# Patient Record
Sex: Male | Born: 1955
Health system: Southern US, Community
[De-identification: ages and names within clinical notes are randomized; demographics above are authoritative.]

## PROBLEM LIST (undated history)

## (undated) DIAGNOSIS — K859 Acute pancreatitis without necrosis or infection, unspecified: Secondary | ICD-10-CM

## (undated) DIAGNOSIS — I509 Heart failure, unspecified: Secondary | ICD-10-CM

## (undated) DIAGNOSIS — K7460 Unspecified cirrhosis of liver: Secondary | ICD-10-CM

## (undated) DIAGNOSIS — I2510 Atherosclerotic heart disease of native coronary artery without angina pectoris: Secondary | ICD-10-CM

## (undated) DIAGNOSIS — I10 Essential (primary) hypertension: Secondary | ICD-10-CM

## (undated) HISTORY — PX: CARDIAC SURGERY: SHX584

---

## 2007-09-19 ENCOUNTER — Emergency Department (HOSPITAL_COMMUNITY): Admission: EM | Admit: 2007-09-19 | Discharge: 2007-09-19 | Payer: Self-pay | Admitting: Emergency Medicine

## 2013-10-02 ENCOUNTER — Encounter (HOSPITAL_BASED_OUTPATIENT_CLINIC_OR_DEPARTMENT_OTHER): Payer: Self-pay | Admitting: Emergency Medicine

## 2013-10-02 ENCOUNTER — Emergency Department (HOSPITAL_BASED_OUTPATIENT_CLINIC_OR_DEPARTMENT_OTHER)
Admission: EM | Admit: 2013-10-02 | Discharge: 2013-10-02 | Disposition: A | Discharge status: Home / Self Care | Payer: Self-pay | Attending: Emergency Medicine | Admitting: Emergency Medicine

## 2013-10-02 DIAGNOSIS — W261XXA Contact with sword or dagger, initial encounter: Secondary | ICD-10-CM

## 2013-10-02 DIAGNOSIS — F17200 Nicotine dependence, unspecified, uncomplicated: Secondary | ICD-10-CM | POA: Insufficient documentation

## 2013-10-02 DIAGNOSIS — W260XXA Contact with knife, initial encounter: Secondary | ICD-10-CM | POA: Insufficient documentation

## 2013-10-02 DIAGNOSIS — S61419A Laceration without foreign body of unspecified hand, initial encounter: Secondary | ICD-10-CM

## 2013-10-02 DIAGNOSIS — Y929 Unspecified place or not applicable: Secondary | ICD-10-CM | POA: Insufficient documentation

## 2013-10-02 DIAGNOSIS — S61209A Unspecified open wound of unspecified finger without damage to nail, initial encounter: Secondary | ICD-10-CM | POA: Insufficient documentation

## 2013-10-02 DIAGNOSIS — Z23 Encounter for immunization: Secondary | ICD-10-CM | POA: Insufficient documentation

## 2013-10-02 DIAGNOSIS — Y9389 Activity, other specified: Secondary | ICD-10-CM | POA: Insufficient documentation

## 2013-10-02 MED ORDER — TETANUS-DIPHTH-ACELL PERTUSSIS 5-2.5-18.5 LF-MCG/0.5 IM SUSP
0.5000 mL | Freq: Once | INTRAMUSCULAR | Status: AC
  Administered 2013-10-02: 0.5 mL via INTRAMUSCULAR
  Filled 2013-10-02: qty 0.5

## 2013-10-02 NOTE — ED Notes (Signed)
Pt reports laceration on (R) thumb 2-3 minutes PTA.  Bleeding controlled.

## 2013-10-02 NOTE — ED Notes (Signed)
MD at bedside. 

## 2013-10-02 NOTE — ED Provider Notes (Signed)
CSN: [id number]     Arrival date & time 10/02/13  1657 History   First MD Initiated Contact with Patient 10/02/13 1708     Chief Complaint  Patient presents with  . Extremity Laceration     (Consider location/radiation/quality/duration/timing/severity/associated sxs/prior Treatment) Patient is a 58 y.o. male presenting with skin laceration.  Laceration Location:  Finger Finger laceration location:  R thumb Quality: straight   Bleeding: controlled   Time since incident:  20 minutes Laceration mechanism:  Knife Pain details:    Severity:  Mild   Timing:  Constant Foreign body present:  No foreign bodies Relieved by:  Pressure Worsened by:  Nothing tried Tetanus status:  Out of date   History reviewed. No pertinent past medical history. History reviewed. No pertinent past surgical history. History reviewed. No pertinent family history. History  Substance Use Topics  . Smoking status: Current Every Day Smoker -- 0.50 packs/day    Types: Cigarettes  . Smokeless tobacco: Not on file  . Alcohol Use: 1.2 oz/week    2 Cans of beer per week     Comment: 2 40oz beer daily    Review of Systems  All other systems reviewed and are negative.     Allergies  Review of patient's allergies indicates no known allergies.  Home Medications   Prior to Admission medications   Not on File   BP 163/91  Pulse 72  Temp(Src) 98 F (36.7 C) (Oral)  Resp 18  Ht 5\' 5"  (1.651 m)  Wt 130 lb (58.968 kg)  BMI 21.63 kg/m2  SpO2 98% Physical Exam  Nursing note and vitals reviewed. Constitutional: He is oriented to person, place, and time. He appears well-developed and well-nourished. No distress.  HENT:  Head: Normocephalic and atraumatic.  Eyes: Conjunctivae are normal. No scleral icterus.  Neck: Neck supple.  Cardiovascular: Normal rate and intact distal pulses.   Pulmonary/Chest: Effort normal. No stridor. No respiratory distress.  Abdominal: Normal appearance. He exhibits no  distension.  Neurological: He is alert and oriented to person, place, and time.  Skin: Skin is warm and dry. No rash noted.  2cm linear laceration to base of right thumb.    Psychiatric: He has a normal mood and affect. His behavior is normal.    ED Course  LACERATION REPAIR Date/Time: 10/02/2013 6:07 PM Performed by: Blake Divine DAVID III Authorized by: Blake Divine DAVID III Consent: Verbal consent obtained. Risks and benefits: risks, benefits and alternatives were discussed Body area: upper extremity Location details: right thumb Laceration length: 2 cm Foreign bodies: no foreign bodies Tendon involvement: none Nerve involvement: none Vascular damage: no Local anesthetic: lidocaine 2% without epinephrine Anesthetic total: 2 ml Irrigation solution: saline Irrigation method: jet lavage Amount of cleaning: standard Debridement: none Degree of undermining: none Skin closure: 4-0 Prolene Number of sutures: 3 Technique: simple Approximation: close Approximation difficulty: simple Patient tolerance: Patient tolerated the procedure well with no immediate complications.   (including critical care time) Labs Review Labs Reviewed - No data to display  Imaging Review No results found.   EKG Interpretation None      MDM   Final diagnoses:  Hand laceration    Lac repaired.  Tetanus updated.    Candyce Churn III, MD 10/02/13 [phone]

## 2013-10-02 NOTE — ED Notes (Signed)
MD at bedside suturing lac 

## 2013-10-02 NOTE — Discharge Instructions (Signed)

## 2014-10-12 DIAGNOSIS — E785 Hyperlipidemia, unspecified: Secondary | ICD-10-CM | POA: Diagnosis present

## 2015-03-28 DIAGNOSIS — D509 Iron deficiency anemia, unspecified: Secondary | ICD-10-CM | POA: Diagnosis present

## 2015-12-12 DIAGNOSIS — M545 Low back pain, unspecified: Secondary | ICD-10-CM | POA: Insufficient documentation

## 2015-12-12 DIAGNOSIS — G8929 Other chronic pain: Secondary | ICD-10-CM | POA: Insufficient documentation

## 2015-12-12 DIAGNOSIS — Z951 Presence of aortocoronary bypass graft: Secondary | ICD-10-CM

## 2018-06-22 ENCOUNTER — Emergency Department (HOSPITAL_BASED_OUTPATIENT_CLINIC_OR_DEPARTMENT_OTHER): Payer: Medicaid Other

## 2018-06-22 ENCOUNTER — Other Ambulatory Visit: Payer: Self-pay

## 2018-06-22 ENCOUNTER — Encounter (HOSPITAL_BASED_OUTPATIENT_CLINIC_OR_DEPARTMENT_OTHER): Payer: Self-pay | Admitting: Emergency Medicine

## 2018-06-22 ENCOUNTER — Inpatient Hospital Stay (HOSPITAL_BASED_OUTPATIENT_CLINIC_OR_DEPARTMENT_OTHER)
Admission: EM | Admit: 2018-06-22 | Discharge: 2018-06-27 | DRG: 287 | Disposition: A | Discharge status: Home Health | Payer: Medicaid Other | Attending: Internal Medicine | Admitting: Internal Medicine

## 2018-06-22 DIAGNOSIS — R7989 Other specified abnormal findings of blood chemistry: Secondary | ICD-10-CM | POA: Diagnosis present

## 2018-06-22 DIAGNOSIS — Z23 Encounter for immunization: Secondary | ICD-10-CM

## 2018-06-22 DIAGNOSIS — Z951 Presence of aortocoronary bypass graft: Secondary | ICD-10-CM | POA: Diagnosis not present

## 2018-06-22 DIAGNOSIS — J449 Chronic obstructive pulmonary disease, unspecified: Secondary | ICD-10-CM | POA: Diagnosis present

## 2018-06-22 DIAGNOSIS — R42 Dizziness and giddiness: Secondary | ICD-10-CM | POA: Diagnosis not present

## 2018-06-22 DIAGNOSIS — R0609 Other forms of dyspnea: Secondary | ICD-10-CM

## 2018-06-22 DIAGNOSIS — I351 Nonrheumatic aortic (valve) insufficiency: Secondary | ICD-10-CM | POA: Diagnosis present

## 2018-06-22 DIAGNOSIS — Z7982 Long term (current) use of aspirin: Secondary | ICD-10-CM | POA: Diagnosis not present

## 2018-06-22 DIAGNOSIS — D509 Iron deficiency anemia, unspecified: Secondary | ICD-10-CM | POA: Diagnosis present

## 2018-06-22 DIAGNOSIS — I110 Hypertensive heart disease with heart failure: Principal | ICD-10-CM | POA: Diagnosis present

## 2018-06-22 DIAGNOSIS — I447 Left bundle-branch block, unspecified: Secondary | ICD-10-CM | POA: Diagnosis not present

## 2018-06-22 DIAGNOSIS — Z599 Problem related to housing and economic circumstances, unspecified: Secondary | ICD-10-CM

## 2018-06-22 DIAGNOSIS — I2510 Atherosclerotic heart disease of native coronary artery without angina pectoris: Secondary | ICD-10-CM | POA: Diagnosis present

## 2018-06-22 DIAGNOSIS — N179 Acute kidney failure, unspecified: Secondary | ICD-10-CM | POA: Diagnosis present

## 2018-06-22 DIAGNOSIS — R634 Abnormal weight loss: Secondary | ICD-10-CM | POA: Diagnosis present

## 2018-06-22 DIAGNOSIS — Z716 Tobacco abuse counseling: Secondary | ICD-10-CM

## 2018-06-22 DIAGNOSIS — F17210 Nicotine dependence, cigarettes, uncomplicated: Secondary | ICD-10-CM | POA: Diagnosis present

## 2018-06-22 DIAGNOSIS — I5021 Acute systolic (congestive) heart failure: Secondary | ICD-10-CM | POA: Diagnosis present

## 2018-06-22 DIAGNOSIS — I5023 Acute on chronic systolic (congestive) heart failure: Secondary | ICD-10-CM | POA: Diagnosis not present

## 2018-06-22 DIAGNOSIS — I219 Acute myocardial infarction, unspecified: Secondary | ICD-10-CM | POA: Diagnosis not present

## 2018-06-22 DIAGNOSIS — Z79899 Other long term (current) drug therapy: Secondary | ICD-10-CM | POA: Diagnosis not present

## 2018-06-22 DIAGNOSIS — Z9114 Patient's other noncompliance with medication regimen: Secondary | ICD-10-CM

## 2018-06-22 DIAGNOSIS — I083 Combined rheumatic disorders of mitral, aortic and tricuspid valves: Secondary | ICD-10-CM | POA: Diagnosis present

## 2018-06-22 DIAGNOSIS — R509 Fever, unspecified: Secondary | ICD-10-CM | POA: Diagnosis not present

## 2018-06-22 DIAGNOSIS — Z955 Presence of coronary angioplasty implant and graft: Secondary | ICD-10-CM

## 2018-06-22 DIAGNOSIS — Z681 Body mass index (BMI) 19 or less, adult: Secondary | ICD-10-CM

## 2018-06-22 DIAGNOSIS — Z862 Personal history of diseases of the blood and blood-forming organs and certain disorders involving the immune mechanism: Secondary | ICD-10-CM

## 2018-06-22 DIAGNOSIS — I509 Heart failure, unspecified: Secondary | ICD-10-CM

## 2018-06-22 DIAGNOSIS — R059 Cough, unspecified: Secondary | ICD-10-CM

## 2018-06-22 DIAGNOSIS — I25110 Atherosclerotic heart disease of native coronary artery with unstable angina pectoris: Secondary | ICD-10-CM | POA: Diagnosis not present

## 2018-06-22 DIAGNOSIS — R778 Other specified abnormalities of plasma proteins: Secondary | ICD-10-CM

## 2018-06-22 DIAGNOSIS — R0602 Shortness of breath: Secondary | ICD-10-CM

## 2018-06-22 DIAGNOSIS — R05 Cough: Secondary | ICD-10-CM

## 2018-06-22 DIAGNOSIS — I252 Old myocardial infarction: Secondary | ICD-10-CM

## 2018-06-22 HISTORY — DX: Essential (primary) hypertension: I10

## 2018-06-22 HISTORY — DX: Atherosclerotic heart disease of native coronary artery without angina pectoris: I25.10

## 2018-06-22 LAB — CBC WITH DIFFERENTIAL/PLATELET
Abs Immature Granulocytes: 0.04 10*3/uL (ref 0.00–0.07)
BASOS PCT: 0 %
Basophils Absolute: 0 10*3/uL (ref 0.0–0.1)
EOS ABS: 0.1 10*3/uL (ref 0.0–0.5)
EOS PCT: 1 %
HCT: 45 % (ref 39.0–52.0)
Hemoglobin: 14.4 g/dL (ref 13.0–17.0)
Immature Granulocytes: 0 %
Lymphocytes Relative: 6 %
Lymphs Abs: 0.7 10*3/uL (ref 0.7–4.0)
MCH: 28.5 pg (ref 26.0–34.0)
MCHC: 32 g/dL (ref 30.0–36.0)
MCV: 88.9 fL (ref 80.0–100.0)
MONO ABS: 0.5 10*3/uL (ref 0.1–1.0)
Monocytes Relative: 5 %
Neutro Abs: 9.9 10*3/uL — ABNORMAL HIGH (ref 1.7–7.7)
Neutrophils Relative %: 88 %
PLATELETS: 190 10*3/uL (ref 150–400)
RBC: 5.06 MIL/uL (ref 4.22–5.81)
RDW: 20.2 % — AB (ref 11.5–15.5)
WBC: 11.3 10*3/uL — AB (ref 4.0–10.5)
nRBC: 0 % (ref 0.0–0.2)

## 2018-06-22 LAB — COMPREHENSIVE METABOLIC PANEL
ALK PHOS: 70 U/L (ref 38–126)
ALT: 15 U/L (ref 0–44)
ANION GAP: 14 (ref 5–15)
AST: 43 U/L — ABNORMAL HIGH (ref 15–41)
Albumin: 4.4 g/dL (ref 3.5–5.0)
BUN: 16 mg/dL (ref 8–23)
CALCIUM: 8.8 mg/dL — AB (ref 8.9–10.3)
CHLORIDE: 104 mmol/L (ref 98–111)
CO2: 18 mmol/L — ABNORMAL LOW (ref 22–32)
Creatinine, Ser: 1.05 mg/dL (ref 0.61–1.24)
GFR calc non Af Amer: >60 mL/min (ref >60)
Glucose, Bld: 131 mg/dL — ABNORMAL HIGH (ref 70–99)
POTASSIUM: 3.1 mmol/L — AB (ref 3.5–5.1)
SODIUM: 136 mmol/L (ref 135–145)
TOTAL PROTEIN: 8.9 g/dL — AB (ref 6.5–8.1)
Total Bilirubin: 0.7 mg/dL (ref 0.3–1.2)

## 2018-06-22 LAB — BASIC METABOLIC PANEL
Anion gap: 15 (ref 5–15)
BUN: 19 mg/dL (ref 8–23)
CO2: 22 mmol/L (ref 22–32)
Calcium: 8.8 mg/dL — ABNORMAL LOW (ref 8.9–10.3)
Chloride: 100 mmol/L (ref 98–111)
Creatinine, Ser: 1.3 mg/dL — ABNORMAL HIGH (ref 0.61–1.24)
GFR calc Af Amer: >60 mL/min (ref >60)
GFR calc non Af Amer: 58 mL/min — ABNORMAL LOW (ref >60)
Glucose, Bld: 134 mg/dL — ABNORMAL HIGH (ref 70–99)
Potassium: 3.5 mmol/L (ref 3.5–5.1)
Sodium: 137 mmol/L (ref 135–145)

## 2018-06-22 LAB — PROTIME-INR
INR: 1.3
PROTHROMBIN TIME: 16.1 s — AB (ref 11.4–15.2)

## 2018-06-22 LAB — BRAIN NATRIURETIC PEPTIDE: B NATRIURETIC PEPTIDE 5: 4095.8 pg/mL — AB (ref 0.0–100.0)

## 2018-06-22 LAB — TROPONIN I
Troponin I: 0.38 ng/mL (ref <0.03)
Troponin I: 0.49 ng/mL (ref <0.03)

## 2018-06-22 MED ORDER — SODIUM CHLORIDE 0.9% FLUSH: 3.0000 mL | INTRAVENOUS | Status: DC | PRN

## 2018-06-22 MED ORDER — ASPIRIN EC 81 MG PO TBEC
81.0000 mg | DELAYED_RELEASE_TABLET | Freq: Every day | ORAL | Status: DC
  Administered 2018-06-23 – 2018-06-27 (×4): 81 mg via ORAL
  Filled 2018-06-22 (×5): qty 1

## 2018-06-22 MED ORDER — IPRATROPIUM-ALBUTEROL 0.5-2.5 (3) MG/3ML IN SOLN
3.0000 mL | Freq: Once | RESPIRATORY_TRACT | Status: AC
  Administered 2018-06-22: 3 mL via RESPIRATORY_TRACT
  Filled 2018-06-22: qty 3

## 2018-06-22 MED ORDER — INFLUENZA VAC SPLIT QUAD 0.5 ML IM SUSY
0.5000 mL | PREFILLED_SYRINGE | INTRAMUSCULAR | Status: AC
  Administered 2018-06-26: 0.5 mL via INTRAMUSCULAR
  Filled 2018-06-22 (×3): qty 0.5

## 2018-06-22 MED ORDER — ENOXAPARIN SODIUM 40 MG/0.4ML ~~LOC~~ SOLN
40.0000 mg | SUBCUTANEOUS | Status: DC
  Administered 2018-06-22 – 2018-06-26 (×5): 40 mg via SUBCUTANEOUS
  Filled 2018-06-22 (×5): qty 0.4

## 2018-06-22 MED ORDER — FUROSEMIDE 10 MG/ML IJ SOLN
40.0000 mg | Freq: Once | INTRAMUSCULAR | Status: AC
  Administered 2018-06-22: 40 mg via INTRAVENOUS
  Filled 2018-06-22: qty 4

## 2018-06-22 MED ORDER — ASPIRIN 81 MG PO CHEW
324.0000 mg | CHEWABLE_TABLET | Freq: Once | ORAL | Status: AC
  Administered 2018-06-22: 324 mg via ORAL
  Filled 2018-06-22: qty 4

## 2018-06-22 MED ORDER — ALBUTEROL SULFATE (2.5 MG/3ML) 0.083% IN NEBU: 2.5000 mg | INHALATION_SOLUTION | RESPIRATORY_TRACT | Status: DC | PRN

## 2018-06-22 MED ORDER — FUROSEMIDE 10 MG/ML IJ SOLN: 40.0000 mg | Freq: Two times a day (BID) | INTRAMUSCULAR | Status: DC

## 2018-06-22 MED ORDER — ACETAMINOPHEN 325 MG PO TABS: 650.0000 mg | ORAL_TABLET | ORAL | Status: DC | PRN

## 2018-06-22 MED ORDER — ALBUTEROL SULFATE (2.5 MG/3ML) 0.083% IN NEBU
2.5000 mg | INHALATION_SOLUTION | Freq: Once | RESPIRATORY_TRACT | Status: AC
  Administered 2018-06-22: 2.5 mg via RESPIRATORY_TRACT
  Filled 2018-06-22: qty 3

## 2018-06-22 MED ORDER — METOPROLOL TARTRATE 50 MG PO TABS
25.0000 mg | ORAL_TABLET | Freq: Once | ORAL | Status: AC
  Administered 2018-06-22: 25 mg via ORAL
  Filled 2018-06-22: qty 1

## 2018-06-22 MED ORDER — SODIUM CHLORIDE 0.9 % IV SOLN
250.0000 mL | INTRAVENOUS | Status: DC | PRN
  Administered 2018-06-26: 250 mL via INTRAVENOUS

## 2018-06-22 MED ORDER — POTASSIUM CHLORIDE CRYS ER 20 MEQ PO TBCR
40.0000 meq | EXTENDED_RELEASE_TABLET | Freq: Once | ORAL | Status: AC
  Administered 2018-06-22: 40 meq via ORAL
  Filled 2018-06-22: qty 2

## 2018-06-22 MED ORDER — ONDANSETRON HCL 4 MG/2ML IJ SOLN: 4.0000 mg | Freq: Four times a day (QID) | INTRAMUSCULAR | Status: DC | PRN

## 2018-06-22 MED ORDER — LISINOPRIL 5 MG PO TABS: 5.0000 mg | ORAL_TABLET | Freq: Every day | ORAL | Status: DC

## 2018-06-22 MED ORDER — METOPROLOL TARTRATE 25 MG PO TABS
25.0000 mg | ORAL_TABLET | Freq: Two times a day (BID) | ORAL | Status: DC
  Administered 2018-06-22 – 2018-06-24 (×4): 25 mg via ORAL
  Filled 2018-06-22 (×4): qty 1

## 2018-06-22 MED ORDER — FUROSEMIDE 10 MG/ML IJ SOLN: 40.0000 mg | Freq: Once | INTRAMUSCULAR | Status: DC

## 2018-06-22 MED ORDER — SODIUM CHLORIDE 0.9% FLUSH
3.0000 mL | Freq: Two times a day (BID) | INTRAVENOUS | Status: DC
  Administered 2018-06-22 – 2018-06-27 (×8): 3 mL via INTRAVENOUS

## 2018-06-22 NOTE — ED Notes (Signed)
States," I feel great, thank you for asking" Smiling at nurse

## 2018-06-22 NOTE — ED Provider Notes (Signed)
[facility] EMERGENCY DEPARTMENT Provider Note   CSN: [id number] Arrival date & time: 06/22/18  1325     History   Chief Complaint Chief Complaint  Patient presents with  . Cough    HPI Jeremy Powers is a 63 y.o. male.  HPI Reports he has had increasing shortness of breath for couple of days.  He has noticed that he is coughing more.  He reports he gets short of breath if he has to walk distances that typically he walks without trouble.  If he walks across a parking lot he is very short of breath.  He reports he has had dry cough.  No fever or chills.  He reports he has had some generalized headache.  No sore throat or nasal congestion.  He has not noticed significant swelling in his legs.  Patient reports he did unfortunately run out of his medications for about 2 weeks.  He just got them filled yesterday to restart them.  Patient does smoke about 8 cigarettes/day.  He denies he is supposed to be using any kind of inhalers or prescribed any medications for COPD.  He drinks wine when available but reports often he cannot afford it.    He has history of bypass surgery by EMR:  [Coronary artery disease: Prior stent to the LAD in 2015 with re-stent on 05/2014. Had PTCA to LAD on 07/2015 and CABG on 10/2015. He continues to complain of exertional chest pain with relief when he rests or sits down. His nuclear medicine study on 12/26/2017 for further evaluation of his symptoms that was negative for ischemia. An echocardiogram was performed on 01/15/2018 for further evaluation of a heart murmur that showed EF of 50-55% with mild MR and moderate AR. He continues to have financial issues with no insurance and has only been able to afford aspirin and lisinopril. At this time, he is willing to add an additional medication to help with his chest pain and overall heart function. We will add Lopressor 50 mg BID and reassess his symptoms at his next visit. We can also order an echocardiogram when he  comes for his next visit to reassess overall LV and valvular function.]   Past Medical History:  Diagnosis Date  . Hypertension     There are no active problems to display for this patient.   History reviewed. No pertinent surgical history.      Home Medications    Prior to Admission medications   Medication Sig Start Date End Date Taking? Authorizing Provider  aspirin EC 81 MG tablet Take by mouth. 10/05/15 09/01/18 Yes [provider]  lisinopril (PRINIVIL,ZESTRIL) 10 MG tablet Take by mouth. 06/03/18 09/01/18 Yes [provider]  metoprolol tartrate (LOPRESSOR) 25 MG tablet Take by mouth. 06/11/16 09/01/18 Yes [provider]    Family History No family history on file.  Social History Social History   Tobacco Use  . Smoking status: Current Every Day Smoker    Packs/day: 0.50    Types: Cigarettes  . Smokeless tobacco: Never Used  Substance Use Topics  . Alcohol use: Yes    Alcohol/week: 2.0 standard drinks    Types: 2 Cans of beer per week    Comment: 2 40oz beer daily  . Drug use: Yes    Types: Marijuana     Allergies   Patient has no known allergies.   Review of Systems Review of Systems 10 Systems reviewed and are negative for acute change except as noted  in the HPI.  Physical Exam Updated Vital Signs BP (!) 145/85   Pulse (!) 118   Temp 98.6 F (37 C)   Resp (!) 27   Ht 5\' 6"  (1.676 m)   Wt 61.2 kg   SpO2 95%   BMI 21.79 kg/m   Physical Exam Constitutional:      Comments: Is alert.  Mild creased work of breathing at rest.  Mental status clear.  No acute distress.  Patient is very thin.  HENT:     Head: Normocephalic and atraumatic.     Comments: Mucous membranes moist.  Very poor dentition.  Predominantly edentulous. Eyes:     Extraocular Movements: Extraocular movements intact.  Cardiovascular:     Rate and Rhythm: Normal rate and regular rhythm.     Pulses: Normal pulses.  Pulmonary:     Comments: Mild  increased work of breathing.  Sounds grossly clear.  Occasional end expiratory wheeze. Abdominal:     General: There is no distension.     Palpations: Abdomen is soft.     Tenderness: There is no abdominal tenderness. There is no guarding.  Musculoskeletal: Normal range of motion.        General: No swelling or tenderness.     Right lower leg: No edema.     Left lower leg: No edema.  Skin:    General: Skin is warm and dry.  Neurological:     General: No focal deficit present.     Mental Status: He is oriented to person, place, and time.     Coordination: Coordination normal.      ED Treatments / Results  Labs (all labs ordered are listed, but only abnormal results are displayed) Labs Reviewed  COMPREHENSIVE METABOLIC PANEL - Abnormal; Notable for the following components:      Result Value   Potassium 3.1 (*)    CO2 18 (*)    Glucose, Bld 131 (*)    Calcium 8.8 (*)    Total Protein 8.9 (*)    AST 43 (*)    All other components within normal limits  BRAIN NATRIURETIC PEPTIDE - Abnormal; Notable for the following components:   B Natriuretic Peptide 4,095.8 (*)    All other components within normal limits  CBC WITH DIFFERENTIAL/PLATELET - Abnormal; Notable for the following components:   WBC 11.3 (*)    RDW 20.2 (*)    Neutro Abs 9.9 (*)    All other components within normal limits  PROTIME-INR - Abnormal; Notable for the following components:   Prothrombin Time 16.1 (*)    All other components within normal limits  TROPONIN I - Abnormal; Notable for the following components:   Troponin I 0.38 (*)    All other components within normal limits    EKG EKG Interpretation  Date/Time:  Sunday June 22 2018 13:47:59 EST Ventricular Rate:  105 PR Interval:    QRS Duration: 193 QT Interval:  499 QTC Calculation: 660 R Axis:   36 Text Interpretation:  Sinus tachycardia Prolonged PR interval Biatrial enlargement IVCD, consider atypical LBBB Confirmed by Arby BarrettePfeiffer, Erlin Gardella  [phone] on 06/22/2018 4:05:39 PM   Radiology Dg Chest 2 View  Result Date: 06/22/2018 CLINICAL DATA:  Cough and congestion in a cigarette smoker. EXAM: CHEST - 2 VIEW COMPARISON:  Single-view of the chest 11/10/2015. CT chest 12/10/2014. FINDINGS: The patient is status post CABG. Heart size is upper normal. Lungs are clear. No pneumothorax or pleural effusion. No acute or focal bony abnormality.  IMPRESSION: No acute disease. Electronically Signed   By: Drusilla Kanner M.D.   On: 06/22/2018 15:05    Procedures Procedures (including critical care time) CRITICAL CARE Performed by: Arby Barrette   Total critical care time:30  minutes  Critical care time was exclusive of separately billable procedures and treating other patients.  Critical care was necessary to treat or prevent imminent or life-threatening deterioration.  Critical care was time spent personally by me on the following activities: development of treatment plan with patient and/or surrogate as well as nursing, discussions with consultants, evaluation of patient's response to treatment, examination of patient, obtaining history from patient or surrogate, ordering and performing treatments and interventions, ordering and review of laboratory studies, ordering and review of radiographic studies, pulse oximetry and re-evaluation of patient's condition. Medications Ordered in ED Medications  ipratropium-albuterol (DUONEB) 0.5-2.5 (3) MG/3ML nebulizer solution 3 mL (3 mLs Nebulization Given 06/22/18 1356)  albuterol (PROVENTIL) (2.5 MG/3ML) 0.083% nebulizer solution 2.5 mg (2.5 mg Nebulization Given 06/22/18 1356)  aspirin chewable tablet 324 mg (324 mg Oral Given 06/22/18 1531)  furosemide (LASIX) injection 40 mg (40 mg Intravenous Given 06/22/18 1534)  Consult: Reviewed with Dr. Duke Salvia except for admission.   Initial Impression / Assessment and Plan / ED Course  I have reviewed the triage vital signs and the nursing notes.  Pertinent  labs & imaging results that were available during my care of the patient were reviewed by me and considered in my medical decision making (see chart for details).    Patient has not had medications for 2 weeks.  He is gotten increasingly short of breath.  Patient has significant history of coronary artery disease.  At this time patient has elevation in BNP with troponin above reference range.  No active chest pain at this time.  Patient was given DuoNeb on arrival.  Appears consistent with CHF exacerbation.  Patient started on Lasix and aspirin.  Patient feels improved.  He is alert and appropriate.  No active chest pain.  Stable for transfer for admission.  Final Clinical Impressions(s) / ED Diagnoses   Final diagnoses:  Acute on chronic congestive heart failure, unspecified heart failure type (HCC)  Dyspnea on exertion  Troponin I above reference range    ED Discharge Orders    None       Arby Barrette, MD 06/22/18 [phone]

## 2018-06-22 NOTE — ED Notes (Addendum)
Date and time results received: 06/22/18 1500   Test: trp result: 0.38  Name of Provider Notified:pfeiffer  Orders Received? Or Actions Taken?:no orders given

## 2018-06-22 NOTE — ED Notes (Signed)
Pt c/o cough and HA x 2 days. Pt also c/o nausea. Pt denies fever

## 2018-06-22 NOTE — Progress Notes (Signed)
Patient's Troponin is critical 0.49. Cardiology has been paged.

## 2018-06-22 NOTE — ED Notes (Signed)
Report given to [facility], receiving nurse at [facility]

## 2018-06-22 NOTE — ED Triage Notes (Signed)
Cough and headache since yesterday.

## 2018-06-22 NOTE — H&P (Addendum)
History & Physical    Patient ID: Jeremy Powers MRN: [id number], DOB/AGE: [birth year]   Admit date: 06/22/2018  Primary Physician: [facility] Primary Cardiologist: Dr. Vern[facility])  CC: cough and short of breath  History of Present Illness    This is a 63 year old man with a history of CAD with NSTEMI in 2015 s/p PCI to LAD in 2015 and 2016, then CABG in 2017 (LIMA-LAD, SVGs to D1, D2), aortic and mitral regurgitation, and severe iron deficiency anemia (planned capsule endoscopy). Two days ago he felt like he "might have been coming down with a cold", primarily with a dry cough that persisted and felt kind of hot. Since then had increasing exertional dyspnea with normal exertion (walking across a parking lot). He denies having any chest pain, but has off and on in the past. Last night he had a generalized headache and difficulty sleeping due to orthopnea prompting him to come to the hospital. He has not taken any medications for the last month, but filled iron, metoprolol, and aspirin when he started feeling bad and started them again this morning. He denies any true fevers, hemoptysis, syncope, vomiting, URI symptoms, or known sick contacts. He reports some unintentional weight loss over the past couple of years but cannot quantify.   He initially went to the [facility] ED where he was afebrile, mildly tachycardic and tachypnic. Labs significant for trop 0.38, BNP > 4000, WBC 11, Hgb 14, K 3.1, HCO3 18. CXR shows mild hyperinflation, but no significant acute pulmonary disease or masses. He was given bronchodilators and Lasix. On arrival here he now reports feeling completely better, comfortably lying in bed.   Home Medications    Prior to Admission medications   Medication Sig Start Date End Date Taking? Authorizing Provider  aspirin EC 81 MG tablet Take by mouth. 10/05/15 09/01/18 Yes [provider]  ferrous sulfate 325 (65 FE) MG  tablet Take 325 mg by mouth 2 (two) times daily with a meal.   Yes [provider]  metoprolol tartrate (LOPRESSOR) 25 MG tablet Take 25 mg by mouth 2 (two) times daily.  06/11/16 09/01/18 Yes [provider]  lisinopril (PRINIVIL,ZESTRIL) 10 MG tablet Take by mouth. 06/03/18 09/01/18  [provider]   Allergies  No Known Allergies   Past Medical History    Past Medical History:  Diagnosis Date  . Coronary artery disease   . Hypertension     Past Surgical History:  Procedure Laterality Date  . CARDIAC SURGERY       Family History    No family history on file. has no family status information on file.    Social History    Social History   Socioeconomic History  . Marital status: Single    Spouse name: Not on file  . Number of children: Not on file  . Years of education: Not on file  . Highest education level: Not on file  Occupational History  . Not on file  Social Needs  . Financial resource strain: Not on file  . Food insecurity:    Worry: Not on file    Inability: Not on file  . Transportation needs:    Medical: Not on file    Non-medical: Not on file  Tobacco Use  . Smoking status: Current Every Day Smoker    Packs/day: 0.50    Types: Cigarettes  . Smokeless tobacco: Never Used  Substance  and Sexual Activity  . Alcohol use: Yes    Alcohol/week: 2.0 standard drinks    Types: 2 Cans of beer per week    Comment: 2 40oz beer daily  . Drug use: Yes    Types: Marijuana  . Sexual activity: Not on file  Lifestyle  . Physical activity:    Days per week: Not on file    Minutes per session: Not on file  . Stress: Not on file  Relationships  . Social connections:    Talks on phone: Not on file    Gets together: Not on file    Attends religious service: Not on file    Active member of club or organization: Not on file    Attends meetings of clubs or organizations: Not on file    Relationship status: Not on file  . Intimate  partner violence:    Fear of current or ex partner: Not on file    Emotionally abused: Not on file    Physically abused: Not on file    Forced sexual activity: Not on file  Other Topics Concern  . Not on file  Social History Narrative  . Not on file     Review of Systems    General:  No chills, night sweats. +weight loss and possibly subjective fever.   Cardiovascular:  Per HPI Dermatological: No rash, lesions/masses Respiratory: +dry cough, dyspnea Urologic: No hematuria, dysuria Abdominal:   No nausea, vomiting, diarrhea. No hematochezia, melena, or hematemesis Neurologic:  No visual changes, wkns, changes in mental status. All other systems reviewed and are otherwise negative except as noted above.  Physical Exam    Blood pressure 131/67, pulse 72, temperature 98.5 F (36.9 C), temperature source Oral, resp. rate 18, height 5\' 6"  (1.676 m), weight 61.2 kg, SpO2 98 %.  General: Pleasant thin man in NAD HEENT: Normal  Neck: Supple without masses Lungs:  Scattered short high-pitched wheezes. Resp regular and unlabored. Heart: Normal rate, regular rhythm, 2/6 early peaking systolic murmur at LSB, and 1/6 apical early diastolic murmur. JVP is markedly elevated. Radial pulses 2+ Abdomen: Soft, non-tender, non-distended, BS normal.  Extremities: No clubbing, cyanosis or edema. Warm and well perfused. Neuro: Alert and conversant. Moves all extremities spontaneously. No gross cranial nerve deficits.  Labs    Recent Labs    06/22/18 1355  TROPONINI 0.38*   Lab Results  Component Value Date   WBC 11.3 (H) 06/22/2018   HGB 14.4 06/22/2018   HCT 45.0 06/22/2018   MCV 88.9 06/22/2018   PLT 190 06/22/2018    Recent Labs  Lab 06/22/18 1355  NA 136  K 3.1*  CL 104  CO2 18*  BUN 16  CREATININE 1.05  CALCIUM 8.8*  PROT 8.9*  BILITOT 0.7  ALKPHOS 70  ALT 15  AST 43*  GLUCOSE 131*   No results found for: CHOL, HDL, LDLCALC, TRIG No results found for: [facility]     Radiology Studies    Dg Chest 2 View  Result Date: 06/22/2018 CLINICAL DATA:  Cough and congestion in a cigarette smoker. EXAM: CHEST - 2 VIEW COMPARISON:  Single-view of the chest 11/10/2015. CT chest 12/10/2014. FINDINGS: The patient is status post CABG. Heart size is upper normal. Lungs are clear. No pneumothorax or pleural effusion. No acute or focal bony abnormality. IMPRESSION: No acute disease. Electronically Signed   By: Drusilla Kanner M.D.   On: 06/22/2018 15:05    ECG & Cardiac Imaging    ECG:  Sinus tachycardia with known LBBB  TTE 12/2017 [facility]): Ejection fraction is visually estimated at 50-55%. Moderately dilated left ventricle. Indeterminate diastolic function. Abnormal septal motion due to BBB Right ventricle global systolic function is mildly reduced. Structurally normal mitral valve. Mild to Moderate mitral regurgitation. The aortic valve appears to be trileaflet. Moderate aortic regurgitation. Tricuspid valve is structurally normal. Mild tricuspid regurgitation. Right ventricular systolic pressure of 30 mm Hg consistent with mild pulmonary Hypertension.  Lexiscan 10/2017: Both stress and rest images demonstrate mild thinning of the infero-apical wall, with reverse redistribution. There is no evidence of inducible ischemia on this study. Patient had chest pain before and during regadenoson. Non-diagnostic ECG.  Assessment & Plan    1. Acute decompensated heart failure: NYHA class III, no prior heart failure admissions and not on diuretic at home. Suspect secondary to valvular disease and ischemic heart disease, as well as medication nonadherence. Unusual presentation - symptomatically improved with single dose of Lasix, though JVP markedly elevated and BNP > 4000.  - Lasix 40mg  IV given PTA, hold off on further diuretic tonight with acute rise in Cr and resolution of his symptoms. Will ultimately need further diuresis. - Metoprolol 25mg  BID - Complete  TTE tomorrow - Monitor strict I&Os, daily weight, telemetry  2. 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 History of CAD s/p PCI to LAD and 3v CABG supplying LAD system. Poor medication adherence. Continues to smoke. Not on statin for financial reasons. Rule out ACS, though has remained chest pain-free. - Trend troponin - Full-dose aspirin given, continue aspirin 81mg  - Start atorvastatin 40mg  - Continue home metoprolol - TTE as above  3. Borderline AKI:  Cr 1.05 -> 1.3 after initial diuresis. Given resolution in his symptoms, do not suspect cardiorenal. - No ACEi or diuresis for now; may be able to start tomorrow pending morning labs.  4. Iron Deficiency Anemia: Admitted 03/2018 with Hgb <5 requiring 3 unit transfusion. Upper and lower endoscopy unrevealing. Follows with hematology and GI - he is planned for capsule endoscopy. No symptoms of abnormal bleeding at this time and hemoglobin now normal. - Continue oral iron  Signed, Clerance Lav, MD 06/22/2018, 8:52 PM

## 2018-06-23 ENCOUNTER — Observation Stay (HOSPITAL_BASED_OUTPATIENT_CLINIC_OR_DEPARTMENT_OTHER): Payer: Medicaid Other

## 2018-06-23 DIAGNOSIS — I340 Nonrheumatic mitral (valve) insufficiency: Secondary | ICD-10-CM | POA: Diagnosis not present

## 2018-06-23 DIAGNOSIS — I509 Heart failure, unspecified: Secondary | ICD-10-CM | POA: Diagnosis not present

## 2018-06-23 LAB — BASIC METABOLIC PANEL
ANION GAP: 12 (ref 5–15)
BUN: 28 mg/dL — ABNORMAL HIGH (ref 8–23)
CO2: 20 mmol/L — ABNORMAL LOW (ref 22–32)
Calcium: 9.2 mg/dL (ref 8.9–10.3)
Chloride: 108 mmol/L (ref 98–111)
Creatinine, Ser: 1.29 mg/dL — ABNORMAL HIGH (ref 0.61–1.24)
GFR calc Af Amer: >60 mL/min (ref >60)
GFR calc non Af Amer: 58 mL/min — ABNORMAL LOW (ref >60)
Glucose, Bld: 117 mg/dL — ABNORMAL HIGH (ref 70–99)
Potassium: 4.1 mmol/L (ref 3.5–5.1)
Sodium: 140 mmol/L (ref 135–145)

## 2018-06-23 LAB — ECHOCARDIOGRAM COMPLETE
Height: 66 in
WEIGHTICAEL: 1835.2 [oz_av]

## 2018-06-23 LAB — MAGNESIUM: Magnesium: 2.2 mg/dL (ref 1.7–2.4)

## 2018-06-23 LAB — HIV ANTIBODY (ROUTINE TESTING W REFLEX): HIV SCREEN 4TH GENERATION: NONREACTIVE

## 2018-06-23 MED ORDER — SODIUM CHLORIDE 0.9% FLUSH: 3.0000 mL | INTRAVENOUS | Status: DC | PRN

## 2018-06-23 MED ORDER — SODIUM CHLORIDE 0.9% FLUSH
3.0000 mL | Freq: Two times a day (BID) | INTRAVENOUS | Status: DC
  Administered 2018-06-24: 3 mL via INTRAVENOUS

## 2018-06-23 MED ORDER — SODIUM CHLORIDE 0.9 % IV SOLN
INTRAVENOUS | Status: DC
  Administered 2018-06-24: 06:00:00 via INTRAVENOUS

## 2018-06-23 MED ORDER — ASPIRIN 81 MG PO CHEW
81.0000 mg | CHEWABLE_TABLET | ORAL | Status: AC
  Administered 2018-06-24: 81 mg via ORAL
  Filled 2018-06-23: qty 1

## 2018-06-23 MED ORDER — SODIUM CHLORIDE 0.9 % IV SOLN: 250.0000 mL | INTRAVENOUS | Status: DC | PRN

## 2018-06-23 NOTE — Progress Notes (Signed)
  Echocardiogram 2D Echocardiogram has been performed.  Jeremy Powers 06/23/2018, 9:44 AM

## 2018-06-23 NOTE — Progress Notes (Addendum)
Progress Note  Patient Name: Jeremy Powers Date of Encounter: 06/23/2018  Primary Cardiologist: No primary care provider on file. Dr. Vernona Powers [facility]   Subjective   No chest pain or SOB,  Does have congested cough has been exposed to sick friends, breathing better and head of bed down to 20 degrees.  Inpatient Medications    Scheduled Meds: . aspirin EC  81 mg Oral Daily  . enoxaparin (LOVENOX) injection  40 mg Subcutaneous Q24H  . Influenza vac split quadrivalent PF  0.5 mL Intramuscular Tomorrow-1000  . metoprolol tartrate  25 mg Oral BID  . sodium chloride flush  3 mL Intravenous Q12H   Continuous Infusions: . sodium chloride     PRN Meds: sodium chloride, acetaminophen, albuterol, ondansetron (ZOFRAN) IV, sodium chloride flush   Vital Signs    Vitals:   06/22/18 1927 06/22/18 2127 06/23/18 0508 06/23/18 0936  BP: 131/67 133/70 131/75 123/64  Pulse: 72 70 81 83  Resp: 18  16   Temp: 98.5 F (36.9 C)  97.9 F (36.6 C)   TempSrc: Oral  Oral   SpO2: 98%  98%   Weight: 51.7 kg  52 kg   Height:        Intake/Output Summary (Last 24 hours) at 06/23/2018 1051 Last data filed at 06/22/2018 2127 Gross per 24 hour  Intake 3 ml  Output 300 ml  Net -297 ml   Last 3 Weights 06/23/2018 06/22/2018 06/22/2018  Weight (lbs) 114 lb 11.2 oz 114 lb 135 lb  Weight (kg) 52.028 kg 51.71 kg 61.236 kg      Telemetry    SR with LBBB - Personally Reviewed  ECG     no new - Personally Reviewed  Physical Exam   GEN: No acute distress.   Neck: No to minimal JVD Cardiac: RRR, no murmurs, rubs, or gallops.  Respiratory: diminished breath sounds to auscultation bilaterally. GI: Soft, nontender, non-distended  MS: No edema; No deformity. Neuro:  Nonfocal  Psych: Normal affect   Labs    Chemistry Recent Labs  Lab 06/22/18 1355 06/22/18 2039 06/23/18 0349  NA 136 137 140  K 3.1* 3.5 4.1  CL 104 100 108  CO2 18* 22 20*  GLUCOSE 131* 134* 117*  BUN 16 19  28*  CREATININE 1.05 1.30* 1.29*  CALCIUM 8.8* 8.8* 9.2  PROT 8.9*  --   --   ALBUMIN 4.4  --   --   AST 43*  --   --   ALT 15  --   --   ALKPHOS 70  --   --   BILITOT 0.7  --   --   GFRNONAA >60 58* 58*  GFRAA >60 >60 >60  ANIONGAP 14 15 12      Hematology Recent Labs  Lab 06/22/18 1355  WBC 11.3*  RBC 5.06  HGB 14.4  HCT 45.0  MCV 88.9  MCH 28.5  MCHC 32.0  RDW 20.2*  PLT 190    Cardiac Enzymes Recent Labs  Lab 06/22/18 1355 06/22/18 2039  TROPONINI 0.38* 0.49*   No results for input(s): TROPIPOC in the last 168 hours.   BNP Recent Labs  Lab 06/22/18 1355  BNP 4,095.8*     DDimer No results for input(s): DDIMER in the last 168 hours.   Radiology    Dg Chest 2 View  Result Date: 06/22/2018 CLINICAL DATA:  Cough and congestion in a cigarette smoker. EXAM: CHEST - 2 VIEW COMPARISON:  Single-view of the chest 11/10/2015. CT chest 12/10/2014. FINDINGS: The patient is status post CABG. Heart size is upper normal. Lungs are clear. No pneumothorax or pleural effusion. No acute or focal bony abnormality. IMPRESSION: No acute disease. Electronically Signed   By: Jeremy Powers M.D.   On: 06/22/2018 15:05    Cardiac Studies   TTE 12/2017 [facility]): Ejection fraction is visually estimated at 50-55%. Moderately dilated left ventricle. Indeterminate diastolic function. Abnormal septal motion due to BBB Right ventricle global systolic function is mildly reduced. Structurally normal mitral valve. Mild to Moderate mitral regurgitation. The aortic valve appears to be trileaflet. Moderate aortic regurgitation. Tricuspid valve is structurally normal. Mild tricuspid regurgitation. Right ventricular systolic pressure of 30 mm Hg consistent with mild pulmonary Hypertension.  Lexiscan 10/2017: Both stress and rest images demonstrate mild thinning of the infero-apical wall, with reverse redistribution. There is no evidence of inducible ischemia on this study.  Patient had chest pain before and during regadenoson. Non-diagnostic ECG.  Patient Profile     63 y.o. male with a history of CAD with NSTEMI in 2015 s/p PCI to LAD in 2015 and 2016, then CABG in 2017 (LIMA-LAD, SVGs to D1, D2), aortic and mitral regurgitation, and severe iron deficiency anemia (planned capsule endoscopy). Now with dry cough  But afebrile.  BNP > 4000, CHF.   Assessment & Plan    Acute decompensated HF.  No hx of heart failure.  Given IV lasix X 1.  TTE pending. -- neg 177 and wt is up, but pt is feeling better. --Cr 1.29 down from 1.30.   --CXR was clear.  Chest pain troponin 0.39 to 0.49   With known hx of CAD and CABG  TTE pending   Hx of GI bleed 03/2018 planned for capsule endoscopy on Iron po --hgb was normal on admit  Mild leukocytosis on admit --WBC was mildly elevated.  --afebrile    For questions or updates, please contact [facility] Please consult [email or web address] for contact info under  ---------------------------------------------------------------------------------------------   History and all data above reviewed.  Patient examined.  I agree with the findings as above.  Jeremy Powers has a headache but is otherwise well.  Constitutional: No acute distress ENT: nearly edentulous Cardiovascular: regular rhythm, normal rate, no murmurs. S1 and S2 normal. Radial pulses normal bilaterally. No jugular venous distention.  Respiratory: clear to auscultation bilaterally GI : normal bowel sounds, soft and nontender. No distention.   MSK: extremities warm, well perfused. No edema.  NEURO: grossly nonfocal exam, moves all extremities. PSYCH: alert and oriented x 3, normal mood and affect.   All available labs, radiology testing, previous records reviewed. Agree with documented assessment and plan of my colleague as stated above with the following additions or changes:  Principal Problem:   Acute on chronic congestive heart failure (HCC) Active  Problems:   Aortic insufficiency   Elevated troponin   CAD (coronary artery disease)   Left bundle branch block   History of iron deficiency anemia   Acute on chronic heart failure (HCC)   Plan: await TTE for region wall motion assessment and LV function. Weights are inaccurate, we will assess by I/o and symptoms.   Length of Stay:  LOS: 0 days   Parke Poisson, MD [facility] 12:54 PM  06/23/2018        Signed, Nada Boozer, NP  06/23/2018, 10:51 AM

## 2018-06-23 NOTE — Progress Notes (Signed)
   The risks and benefits of a cardiac catheterization including, but not limited to, death, stroke, MI, kidney damage and bleeding were discussed with the patient who indicates understanding and agrees to proceed.   Jill McDaniel NP-C [facility] Pager: [phone]  

## 2018-06-24 ENCOUNTER — Observation Stay (HOSPITAL_COMMUNITY): Payer: Medicaid Other

## 2018-06-24 ENCOUNTER — Encounter (HOSPITAL_COMMUNITY)
Admission: EM | Disposition: A | Discharge status: Home Health | Payer: Self-pay | Source: Home / Self Care | Attending: Internal Medicine

## 2018-06-24 ENCOUNTER — Observation Stay: Payer: Medicaid Other

## 2018-06-24 ENCOUNTER — Other Ambulatory Visit: Payer: Self-pay

## 2018-06-24 ENCOUNTER — Ambulatory Visit (HOSPITAL_COMMUNITY): Admission: RE | Admit: 2018-06-24 | Payer: Medicaid Other | Source: Home / Self Care | Admitting: Cardiology

## 2018-06-24 DIAGNOSIS — J449 Chronic obstructive pulmonary disease, unspecified: Secondary | ICD-10-CM | POA: Diagnosis present

## 2018-06-24 DIAGNOSIS — I110 Hypertensive heart disease with heart failure: Secondary | ICD-10-CM | POA: Diagnosis present

## 2018-06-24 DIAGNOSIS — Z716 Tobacco abuse counseling: Secondary | ICD-10-CM | POA: Diagnosis not present

## 2018-06-24 DIAGNOSIS — Z599 Problem related to housing and economic circumstances, unspecified: Secondary | ICD-10-CM | POA: Diagnosis not present

## 2018-06-24 DIAGNOSIS — D509 Iron deficiency anemia, unspecified: Secondary | ICD-10-CM | POA: Diagnosis present

## 2018-06-24 DIAGNOSIS — R42 Dizziness and giddiness: Secondary | ICD-10-CM | POA: Diagnosis not present

## 2018-06-24 DIAGNOSIS — I2510 Atherosclerotic heart disease of native coronary artery without angina pectoris: Secondary | ICD-10-CM

## 2018-06-24 DIAGNOSIS — I083 Combined rheumatic disorders of mitral, aortic and tricuspid valves: Secondary | ICD-10-CM | POA: Diagnosis present

## 2018-06-24 DIAGNOSIS — Z951 Presence of aortocoronary bypass graft: Secondary | ICD-10-CM | POA: Diagnosis not present

## 2018-06-24 DIAGNOSIS — F17210 Nicotine dependence, cigarettes, uncomplicated: Secondary | ICD-10-CM | POA: Diagnosis present

## 2018-06-24 DIAGNOSIS — Z9114 Patient's other noncompliance with medication regimen: Secondary | ICD-10-CM | POA: Diagnosis not present

## 2018-06-24 DIAGNOSIS — I5043 Acute on chronic combined systolic (congestive) and diastolic (congestive) heart failure: Secondary | ICD-10-CM | POA: Diagnosis not present

## 2018-06-24 DIAGNOSIS — Z23 Encounter for immunization: Secondary | ICD-10-CM | POA: Diagnosis not present

## 2018-06-24 DIAGNOSIS — Z681 Body mass index (BMI) 19 or less, adult: Secondary | ICD-10-CM | POA: Diagnosis not present

## 2018-06-24 DIAGNOSIS — Z955 Presence of coronary angioplasty implant and graft: Secondary | ICD-10-CM | POA: Diagnosis not present

## 2018-06-24 DIAGNOSIS — I252 Old myocardial infarction: Secondary | ICD-10-CM | POA: Diagnosis not present

## 2018-06-24 DIAGNOSIS — R634 Abnormal weight loss: Secondary | ICD-10-CM | POA: Diagnosis present

## 2018-06-24 DIAGNOSIS — I5023 Acute on chronic systolic (congestive) heart failure: Secondary | ICD-10-CM | POA: Diagnosis present

## 2018-06-24 DIAGNOSIS — R509 Fever, unspecified: Secondary | ICD-10-CM | POA: Diagnosis not present

## 2018-06-24 DIAGNOSIS — I25110 Atherosclerotic heart disease of native coronary artery with unstable angina pectoris: Secondary | ICD-10-CM | POA: Diagnosis present

## 2018-06-24 DIAGNOSIS — I5021 Acute systolic (congestive) heart failure: Secondary | ICD-10-CM | POA: Diagnosis present

## 2018-06-24 DIAGNOSIS — Z79899 Other long term (current) drug therapy: Secondary | ICD-10-CM | POA: Diagnosis not present

## 2018-06-24 DIAGNOSIS — I447 Left bundle-branch block, unspecified: Secondary | ICD-10-CM | POA: Diagnosis present

## 2018-06-24 DIAGNOSIS — R0609 Other forms of dyspnea: Secondary | ICD-10-CM | POA: Diagnosis present

## 2018-06-24 DIAGNOSIS — N179 Acute kidney failure, unspecified: Secondary | ICD-10-CM | POA: Diagnosis present

## 2018-06-24 DIAGNOSIS — Z7982 Long term (current) use of aspirin: Secondary | ICD-10-CM | POA: Diagnosis not present

## 2018-06-24 HISTORY — PX: RIGHT/LEFT HEART CATH AND CORONARY/GRAFT ANGIOGRAPHY: CATH118267

## 2018-06-24 LAB — CBC
HCT: 46.6 % (ref 39.0–52.0)
Hemoglobin: 14.8 g/dL (ref 13.0–17.0)
MCH: 28 pg (ref 26.0–34.0)
MCHC: 31.8 g/dL (ref 30.0–36.0)
MCV: 88.1 fL (ref 80.0–100.0)
Platelets: 180 10*3/uL (ref 150–400)
RBC: 5.29 MIL/uL (ref 4.22–5.81)
RDW: 20 % — ABNORMAL HIGH (ref 11.5–15.5)
WBC: 7.2 10*3/uL (ref 4.0–10.5)
nRBC: 0 % (ref 0.0–0.2)

## 2018-06-24 LAB — BASIC METABOLIC PANEL
Anion gap: 14 (ref 5–15)
BUN: 46 mg/dL — ABNORMAL HIGH (ref 8–23)
CO2: 19 mmol/L — ABNORMAL LOW (ref 22–32)
CREATININE: 1.51 mg/dL — AB (ref 0.61–1.24)
Calcium: 9.2 mg/dL (ref 8.9–10.3)
Chloride: 105 mmol/L (ref 98–111)
GFR calc Af Amer: 56 mL/min — ABNORMAL LOW (ref >60)
GFR calc non Af Amer: 48 mL/min — ABNORMAL LOW (ref >60)
Glucose, Bld: 102 mg/dL — ABNORMAL HIGH (ref 70–99)
Potassium: 4 mmol/L (ref 3.5–5.1)
Sodium: 138 mmol/L (ref 135–145)

## 2018-06-24 LAB — POCT I-STAT EG7
Acid-base deficit: 3 mmol/L — ABNORMAL HIGH (ref 0.0–2.0)
Acid-base deficit: 4 mmol/L — ABNORMAL HIGH (ref 0.0–2.0)
Bicarbonate: 21.1 mmol/L (ref 20.0–28.0)
Bicarbonate: 22.8 mmol/L (ref 20.0–28.0)
Calcium, Ion: 1.15 mmol/L (ref 1.15–1.40)
Calcium, Ion: 1.15 mmol/L (ref 1.15–1.40)
HCT: 43 % (ref 39.0–52.0)
HCT: 44 % (ref 39.0–52.0)
HEMOGLOBIN: 15 g/dL (ref 13.0–17.0)
Hemoglobin: 14.6 g/dL (ref 13.0–17.0)
O2 Saturation: 68 %
O2 Saturation: 71 %
PH VEN: 7.346 (ref 7.250–7.430)
Potassium: 3.5 mmol/L (ref 3.5–5.1)
Potassium: 3.7 mmol/L (ref 3.5–5.1)
SODIUM: 140 mmol/L (ref 135–145)
Sodium: 141 mmol/L (ref 135–145)
TCO2: 22 mmol/L (ref 22–32)
TCO2: 24 mmol/L (ref 22–32)
pCO2, Ven: 39.4 mmHg — ABNORMAL LOW (ref 44.0–60.0)
pCO2, Ven: 41.7 mmHg — ABNORMAL LOW (ref 44.0–60.0)
pH, Ven: 7.337 (ref 7.250–7.430)
pO2, Ven: 38 mmHg (ref 32.0–45.0)
pO2, Ven: 40 mmHg (ref 32.0–45.0)

## 2018-06-24 LAB — POCT I-STAT 7, (LYTES, BLD GAS, ICA,H+H)
Acid-base deficit: 5 mmol/L — ABNORMAL HIGH (ref 0.0–2.0)
BICARBONATE: 19.2 mmol/L — AB (ref 20.0–28.0)
Calcium, Ion: 1.05 mmol/L — ABNORMAL LOW (ref 1.15–1.40)
HCT: 41 % (ref 39.0–52.0)
Hemoglobin: 13.9 g/dL (ref 13.0–17.0)
O2 Saturation: 95 %
Potassium: 3.5 mmol/L (ref 3.5–5.1)
Sodium: 141 mmol/L (ref 135–145)
TCO2: 20 mmol/L — ABNORMAL LOW (ref 22–32)
pCO2 arterial: 33.5 mmHg (ref 32.0–48.0)
pH, Arterial: 7.367 (ref 7.350–7.450)
pO2, Arterial: 80 mmHg — ABNORMAL LOW (ref 83.0–108.0)

## 2018-06-24 LAB — MAGNESIUM: Magnesium: 2.2 mg/dL (ref 1.7–2.4)

## 2018-06-24 SURGERY — RIGHT/LEFT HEART CATH AND CORONARY/GRAFT ANGIOGRAPHY
Anesthesia: LOCAL

## 2018-06-24 MED ORDER — IOHEXOL 350 MG/ML SOLN
INTRAVENOUS | Status: DC | PRN
  Administered 2018-06-24: 100 mL via INTRA_ARTERIAL

## 2018-06-24 MED ORDER — HEPARIN (PORCINE) IN NACL 1000-0.9 UT/500ML-% IV SOLN
INTRAVENOUS | Status: DC | PRN
  Administered 2018-06-24 (×2): 500 mL

## 2018-06-24 MED ORDER — LIDOCAINE HCL (PF) 1 % IJ SOLN
INTRAMUSCULAR | Status: AC
  Filled 2018-06-24: qty 30

## 2018-06-24 MED ORDER — VERAPAMIL HCL 2.5 MG/ML IV SOLN
INTRAVENOUS | Status: DC | PRN
  Administered 2018-06-24: 10 mL via INTRA_ARTERIAL

## 2018-06-24 MED ORDER — HEPARIN SODIUM (PORCINE) 1000 UNIT/ML IJ SOLN
INTRAMUSCULAR | Status: AC
  Filled 2018-06-24: qty 1

## 2018-06-24 MED ORDER — MIDAZOLAM HCL 2 MG/2ML IJ SOLN
INTRAMUSCULAR | Status: DC | PRN
  Administered 2018-06-24: 1 mg via INTRAVENOUS

## 2018-06-24 MED ORDER — HEPARIN SODIUM (PORCINE) 1000 UNIT/ML IJ SOLN
INTRAMUSCULAR | Status: DC | PRN
  Administered 2018-06-24: 2500 [IU] via INTRAVENOUS

## 2018-06-24 MED ORDER — ATORVASTATIN CALCIUM 40 MG PO TABS
40.0000 mg | ORAL_TABLET | Freq: Every day | ORAL | Status: DC
  Administered 2018-06-24 – 2018-06-26 (×3): 40 mg via ORAL
  Filled 2018-06-24 (×4): qty 1

## 2018-06-24 MED ORDER — MIDAZOLAM HCL 2 MG/2ML IJ SOLN
INTRAMUSCULAR | Status: AC
  Filled 2018-06-24: qty 2

## 2018-06-24 MED ORDER — HEPARIN (PORCINE) IN NACL 1000-0.9 UT/500ML-% IV SOLN
INTRAVENOUS | Status: AC
  Filled 2018-06-24: qty 1000

## 2018-06-24 MED ORDER — FENTANYL CITRATE (PF) 100 MCG/2ML IJ SOLN
INTRAMUSCULAR | Status: AC
  Filled 2018-06-24: qty 2

## 2018-06-24 MED ORDER — LIDOCAINE HCL (PF) 1 % IJ SOLN
INTRAMUSCULAR | Status: DC | PRN
  Administered 2018-06-24 (×2): 2 mL via INTRADERMAL

## 2018-06-24 MED ORDER — FENTANYL CITRATE (PF) 100 MCG/2ML IJ SOLN
INTRAMUSCULAR | Status: DC | PRN
  Administered 2018-06-24: 25 ug via INTRAVENOUS

## 2018-06-24 SURGICAL SUPPLY — 14 items
CATH BALLN WEDGE 5F 110CM (CATHETERS) ×2 IMPLANT
CATH INFINITI 5FR AL1 (CATHETERS) ×2 IMPLANT
CATH INFINITI 5FR MULTPACK ANG (CATHETERS) ×2 IMPLANT
DEVICE RAD COMP TR BAND LRG (VASCULAR PRODUCTS) ×2 IMPLANT
ELECT DEFIB PAD ADLT CADENCE (PAD) ×2 IMPLANT
GLIDESHEATH SLEND SS 6F .021 (SHEATH) ×2 IMPLANT
GUIDEWIRE .025 260CM (WIRE) ×2 IMPLANT
GUIDEWIRE INQWIRE 1.5J.035X260 (WIRE) ×1 IMPLANT
INQWIRE 1.5J .035X260CM (WIRE) ×2
KIT HEART LEFT (KITS) ×2 IMPLANT
PACK CARDIAC CATHETERIZATION (CUSTOM PROCEDURE TRAY) ×2 IMPLANT
SHEATH GLIDE SLENDER 4/5FR (SHEATH) ×2 IMPLANT
TRANSDUCER W/STOPCOCK (MISCELLANEOUS) ×2 IMPLANT
TUBING CIL FLEX 10 FLL-RA (TUBING) ×2 IMPLANT

## 2018-06-24 NOTE — Consult Note (Addendum)
Advanced Heart Failure Team Consult Note   Primary Physician: [facility] PCP-Cardiologist:  Dr Vernona Rieger [facility])  Reason for Consultation: Acute systolic HF  HPI:    Jeremy Powers is seen today for evaluation of Acute systolic HF at the request of Dr Okey Dupre.  Jeremy Powers is a 63 y.o. male with a history of CAD with NSTEMI, s/p PCI to LAD in 2015 and 2016, s/p CABG 2017, microcytic/iron deficiency anemia followed by hematology and GI, LBBB, and tobacco use.  Last seen by his cardiology Dr Judithe Modest [facility]) 03/2018. He was having exertional CP, but had a recent normal nuclear study 12/2017. Echo 12/2017 (done to evaluate murmur) showed normal EF 55-60%. Lopressor was added to his regimen. He was having financial difficulties (no insurance) and was not compliant with all medications, but was taking his ASA and lisinopril. Routine bloodwork revealed hemoglobin of 4.9 and he was sent to ER.   He was admitted at [facility] 11/14-11/19/19 with severe anemia. Hemoglobin was 4.9 with no obvious source of bleeding. He received 3 units pRBCs. Iron studies showed severe iron deficiency. He was given IV venofer. EGD and colonoscopy negative. Now follows with hematology and GI as outpatient. Planned for outpatient capsule enteroscopy.  He presented to [facility] ED on 2/2 with cough, SOB, and orthopnea. Stopped meds 1 month ago, but restarted metoprolol, ASA, and iron on day prior to presentation. He ran out of his medications and was unable to afford refills. His copays are $3 each. He was tachycardiac and tachypnic on presentation to [facility] ED. He was given bronchodilators and lasix and was sent to [facility] for admission. Pertinent admission labs: troponin 0.38 > 0.49, BNP 4095, WBC 11.3, hemoglobin 14.4, K 3.1, creatinine 1.05 CXR: mild hyperinflation, no significant acute pulmonary disease or masses.  EKG: NSR with LBBB 82 bpm.   Cardiology admitted and  ordered repeat echo, which showed newly decreased EF 25-30% (50-55% in 12/2017). He was scheduled for LHC for this afternoon, but this was canceled due to elevated creatinine 1.51  He feels okay. Denies any change in his CP. He has CP and SOB after exertion, which resolves with rest. He is a poor historian.   SH: current smoker 1/2 ppd. He does not drive. He walks anywhere he needs to go. Has medicaid, but has trouble paying $3 copays for medications. Rare ETOH use. Rare marijuana use. Lives with his "friend girl"  FH: no known hx of CAD or CHF. Both parents are deceased.   Echo 07-03-18: EF 25-30%, several septal-lateral dyssynchrony, RV normal, mildly dilated LA, mild MR, mod TR, pontaneous echo contrast consistent with low cardiac output/slow flow, but there is no left ventricular thrombus  Echo 01/15/18 [facility]): EF 50-55%, mild MR, moderate AI  Echo 07/2015: "normal LV size and systolic function"  Nuclear study 12/26/17: negative for ischemia  LHC 10/2015: prior to CABG 1. Severe in-stent restenosis of mid-LAD at D1/D2 bifurcations. Recurrent. 2. Otherwise, nonobstructive CAD. 3. Severely reduced LV systolic function, EF 25% 4. Global LV hypokinesis, out of proportion to CAD. 5. Normal PA pressure. 6. Normal RA, PCWP, and LVEDP, consistent with a compensated, chronic CHF  Review of Systems: [y] = yes, [ ]  = no   . General: Weight gain [ ] ; Weight loss [ ] ; Anorexia [ ] ; Fatigue [ ] ; Fever [ ] ; Chills [ ] ; Weakness [ ]   . Cardiac: Chest pain/pressure [ y];  Resting SOB [ ] ; Exertional SOB [ ] y; Orthopnea [ ] ; Pedal Edema [ ] ; Palpitations [ ] ; Syncope [ ] ; Presyncope [ ] ; Paroxysmal nocturnal dyspnea[ ]   . Pulmonary: Cough [ ] ; Wheezing[ ] ; Hemoptysis[ ] ; Sputum [ ] ; Snoring [ ]   . GI: Vomiting[ ] ; Dysphagia[ ] ; Melena[ ] ; Hematochezia [ ] ; Heartburn[ ] ; Abdominal pain [ ] ; Constipation [ ] ; Diarrhea [ ] ; BRBPR [ ]   . GU: Hematuria[ ] ; Dysuria [ ] ; Nocturia[ ]   . Vascular: Pain in legs  with walking [ ] ; Pain in feet with lying flat [ ] ; Non-healing sores [ ] ; Stroke [ ] ; TIA [ ] ; Slurred speech [ ] ;  . Neuro: Headaches[ ] ; Vertigo[ ] ; Seizures[ ] ; Paresthesias[ ] ;Blurred vision [ ] ; Diplopia [ ] ; Vision changes [ ]   . Ortho/Skin: Arthritis [ ] ; Joint pain [ ] ; Muscle pain [ ] ; Joint swelling [ ] ; Back Pain [ ] ; Rash [ ]   . Psych: Depression[ ] ; Anxiety[ ]   . Heme: Bleeding problems [ y]; Clotting disorders [ ] ; Anemia [place][y ]  . Endocrine: Diabetes [ ] ; Thyroid dysfunction[ ]   Home Medications Prior to Admission medications   Medication Sig Start Date End Date Taking? Authorizing Provider  aspirin EC 81 MG tablet Take by mouth. 10/05/15 09/01/18 Yes [provider]  ferrous sulfate 325 (65 FE) MG tablet Take 325 mg by mouth 2 (two) times daily with a meal.   Yes [provider]  lisinopril (PRINIVIL,ZESTRIL) 10 MG tablet Take by mouth. 06/03/18 09/01/18 Yes [provider]  metoprolol tartrate (LOPRESSOR) 25 MG tablet Take 25 mg by mouth 2 (two) times daily.  06/11/16 09/01/18 Yes [provider]    Past Medical History: Past Medical History:  Diagnosis Date  . Coronary artery disease   . Hypertension     Past Surgical History: Past Surgical History:  Procedure Laterality Date  . CARDIAC SURGERY      Family History: No family history on file.  Social History: Social History   Socioeconomic History  . Marital status: Single    Spouse name: Not on file  . Number of children: Not on file  . Years of education: Not on file  . Highest education level: Not on file  Occupational History  . Not on file  Social Needs  . Financial resource strain: Not on file  . Food insecurity:    Worry: Not on file    Inability: Not on file  . Transportation needs:    Medical: Not on file    Non-medical: Not on file  Tobacco Use  . Smoking status: Current Every Day Smoker    Packs/day: 0.50    Types: Cigarettes  . Smokeless tobacco: Never  Used  Substance and Sexual Activity  . Alcohol use: Yes    Alcohol/week: 2.0 standard drinks    Types: 2 Cans of beer per week    Comment: 2 40oz beer daily  . Drug use: Yes    Types: Marijuana  . Sexual activity: Not on file  Lifestyle  . Physical activity:    Days per week: Not on file    Minutes per session: Not on file  . Stress: Not on file  Relationships  . Social connections:    Talks on phone: Not on file    Gets together: Not on file    Attends religious service: Not on file    Active member of club or organization: Not on file    Attends meetings of clubs  or organizations: Not on file    Relationship status: Not on file  Other Topics Concern  . Not on file  Social History Narrative  . Not on file    Allergies:  No Known Allergies  Objective:    Vital Signs:   Temp:  [98.5 F (36.9 C)-99.5 F (37.5 C)] 98.8 F (37.1 C) (02/04 1138) Pulse Rate:  [68-88] 68 (02/04 1138) Resp:  [18-19] 19 (02/04 1138) BP: (127-137)/(66-76) 131/66 (02/04 1138) SpO2:  [94 %-97 %] 97 % (02/04 1138) Weight:  [52.4 kg] 52.4 kg (02/04 0520) Last BM Date: 06/23/18  Weight change: Filed Weights   06/22/18 1927 06/23/18 0508 06/24/18 0520  Weight: 51.7 kg 52 kg 52.4 kg    Intake/Output:   Intake/Output Summary (Last 24 hours) at 06/24/2018 1142 Last data filed at 06/24/2018 0526 Gross per 24 hour  Intake 240 ml  Output 100 ml  Net 140 ml      Physical Exam    General:  Thin.  No resp difficulty HEENT: poor dentation Neck: supple. JVP ~7. Carotids 2+ bilat; no bruits. No lymphadenopathy or thyromegaly appreciated. Cor: PMI nondisplaced. Regular rate & rhythm. No rubs, gallops or murmurs. Lungs: clear Abdomen: soft, nontender, nondistended. No hepatosplenomegaly. No bruits or masses. Good bowel sounds. Extremities: no cyanosis, clubbing, rash, edema Neuro: alert & orientedx3, cranial nerves grossly intact. moves all 4 extremities w/o difficulty. Affect  pleasant   Telemetry   NSR 60s with LBBB. Personally reviewed.   EKG    NSR with LBBB 82 bpm. Personally reviewed.   Labs   Basic Metabolic Panel: Recent Labs  Lab 06/22/18 1355 06/22/18 2039 06/23/18 0349 06/24/18 0518  NA 136 137 140 138  K 3.1* 3.5 4.1 4.0  CL 104 100 108 105  CO2 18* 22 20* 19*  GLUCOSE 131* 134* 117* 102*  BUN 16 19 28* 46*  CREATININE 1.05 1.30* 1.29* 1.51*  CALCIUM 8.8* 8.8* 9.2 9.2  MG  --   --  2.2 2.2    Liver Function Tests: Recent Labs  Lab 06/22/18 1355  AST 43*  ALT 15  ALKPHOS 70  BILITOT 0.7  PROT 8.9*  ALBUMIN 4.4   No results for input(s): LIPASE, AMYLASE in the last 168 hours. No results for input(s): AMMONIA in the last 168 hours.  CBC: Recent Labs  Lab 06/22/18 1355 06/24/18 0518  WBC 11.3* 7.2  NEUTROABS 9.9*  --   HGB 14.4 14.8  HCT 45.0 46.6  MCV 88.9 88.1  PLT 190 180    Cardiac Enzymes: Recent Labs  Lab 06/22/18 1355 06/22/18 2039  TROPONINI 0.38* 0.49*    BNP: BNP (last 3 results) Recent Labs    06/22/18 1355  BNP 4,095.8*    ProBNP (last 3 results) No results for input(s): PROBNP in the last 8760 hours.   CBG: No results for input(s): GLUCAP in the last 168 hours.  Coagulation Studies: Recent Labs    06/22/18 1355  LABPROT 16.1*  INR 1.30     Imaging   Dg Chest 2 View  Result Date: 06/24/2018 CLINICAL DATA:  Shortness of breath for several days EXAM: CHEST - 2 VIEW COMPARISON:  06/22/2018 FINDINGS: Cardiac shadow is within normal limits. Postsurgical changes are again noted and stable. The lungs are hyperaerated without focal infiltrate or sizable effusion. No bony abnormality is noted. IMPRESSION: COPD without acute abnormality. Electronically Signed   By: Alcide Clever M.D.   On: 06/24/2018 11:06  Medications:     Current Medications: . aspirin EC  81 mg Oral Daily  . enoxaparin (LOVENOX) injection  40 mg Subcutaneous Q24H  . Influenza vac split quadrivalent PF   0.5 mL Intramuscular Tomorrow-1000  . metoprolol tartrate  25 mg Oral BID  . sodium chloride flush  3 mL Intravenous Q12H  . sodium chloride flush  3 mL Intravenous Q12H     Infusions: . sodium chloride    . sodium chloride    . sodium chloride 10 mL/hr at 06/24/18 0559       Patient Profile   KENNER AGRO is a 63 y.o. male with a history of CAD with NSTEMI, s/p PCI to LAD in 2015 and 2016, s/p CABG 2017, microcytic/iron deficiency anemia followed by hematology and GI, LBBB, and tobacco use.  Presented to [facility] ED with SOB and cough on 2/2. Sent to [facility] for further evaluation.   Assessment/Plan   1. Acute systolic HF - Newly reduced EF to 25-30%. Prior echo 12/2017: EF 55-60%. Normal nuclear study in 12/2017. No cath since prior to his CABG - Volume looks okay. Lasix has been on hold.  - Hold BB until we know his cardiac output.  - Hold off on starting ACEi/ARNI/ARB/spiro with elevated creatinine. He was prescribed lisinopril 5 mg daily PTA. - If output low, start digoxin - Consider bidil, but I doubt he will be compliant with TID medication.  - Plan R/LHC today.   2. CAD s/p CABG 2017 - Troponin 0.38 -> 0.49 - Has chronic exertional CP, no change in frequency or severity - Going for LHC today.  - Continue ASA. Start statin. Check lipid panel in am.  - BB on hold. He is on lopressor 25 mg BID at home. Will switch to Toprol XL if output okay.  3. LBBB - Chronic, at least since 2017 - If EF remains low, would qualify for CRT-D  4. Tobacco use - Encourages cessation.  5. Microcytic/iron deficiency anemia  - Admitted 03/2018 with hemoglobin 4.9. Colonoscopy and EGD negative. Found to have severe iron deficiency - Followed by hematology and GI as outpatient. Planning for capsule study as outpatient - Hemoglobin stable 14.8  6. AKI - Creatinine has trended up from 1.0 on admit to 1.51 today. He received 40 mg IV lasix on 2/2. - Monitor daily BMET  Medication concerns  reviewed with patient and pharmacy team. Barriers identified: compliance, transportation, affording medications  Length of Stay: 0  Alford Highland, NP  06/24/2018, 11:42 AM  Advanced Heart Failure Team Pager [phone] (M-F; 7a - 4p)  Please contact [facility] for night-coverage after hours (4p -7a ) and weekends on [email or web address]  Patient seen and examined with the above-signed Advanced Practice Provider and/or Housestaff. I personally reviewed laboratory data, imaging studies and relevant notes. I independently examined the patient and formulated the important aspects of the plan. I have edited the note to reflect any of my changes or salient points. I have personally discussed the plan with the patient and/or family.  63 y/o male with CAD s/p CABG 2017, LBBB and tobacco use admitted with progressive exertional dyspnea and chest tightness with NYHA IIIB symptoms. Echo shows newly reduced EF 25-30%. BNP markedly elevated but does not appear volume overloaded on exam. Creatinine trending up. Concern for loss of CABG grafts. Will plan R/L cath today. Further decision making based on results of cath. D/w Dr. Jacques Navy.   Arvilla Meres, MD  2:18 PM

## 2018-06-24 NOTE — Care Plan (Signed)
Febrile to 102.9 oral with associated borderline tachycardia (BP 135/73). No recent meal/drink. He did have right/left heart cath this afternoon. No hypoxia, localizing symptoms of infection, or rigors at this time, but has been cold all day per RN.   Given recent invasive procedure, we will draw 2 sets blood cultures for now and monitor closely with repeat vitals in 1 hour. No Tylenol please. Discussed plan with RN.

## 2018-06-24 NOTE — Care Management Note (Signed)
Case Management Note  Patient Details  Name: Jeremy Powers MRN: [id number] Date of Birth: 1956/05/09  Subjective/Objective:    CHF               Action/Plan: Patient lives at home; Primary Physician: [facility]; has Medicaid with prescription drug coverage; CM will continue to follow for progression of care.  Expected Discharge Date:    possibly 06/28/2018              Expected Discharge Plan:  Home/Self Care  Discharge planning Services  CM Consult  Status of Service:  In process, will continue to follow  Reola Mosher [phone] 06/24/2018, 10:47 AM

## 2018-06-24 NOTE — Progress Notes (Signed)
Pt temp was 102.5 orally. Cardiology was paged. Will place orders for blood cultures and to not give Tylenol at this time. Will recheck temp in an hour.

## 2018-06-24 NOTE — Progress Notes (Addendum)
Progress Note  Patient Name: Jeremy Powers Date of Encounter: 06/24/2018  Primary Cardiologist: Dr. Vernona Rieger [facility])   Subjective   No significant overnight events. Patient denies any chest pain and thinks his shortness of breath continues to improve. Cough is also improving.   Inpatient Medications    Scheduled Meds: . aspirin EC  81 mg Oral Daily  . enoxaparin (LOVENOX) injection  40 mg Subcutaneous Q24H  . Influenza vac split quadrivalent PF  0.5 mL Intramuscular Tomorrow-1000  . metoprolol tartrate  25 mg Oral BID  . sodium chloride flush  3 mL Intravenous Q12H  . sodium chloride flush  3 mL Intravenous Q12H   Continuous Infusions: . sodium chloride    . sodium chloride    . sodium chloride 10 mL/hr at 06/24/18 0559   PRN Meds: sodium chloride, sodium chloride, acetaminophen, albuterol, ondansetron (ZOFRAN) IV, sodium chloride flush, sodium chloride flush   Vital Signs    Vitals:   06/23/18 2033 06/24/18 0034 06/24/18 0520 06/24/18 0741  BP: 128/76 129/66 137/73 130/67  Pulse: 88 79 81 81  Resp:  18 18 18   Temp: 98.6 F (37 C) 99.5 F (37.5 C) 98.5 F (36.9 C) 98.8 F (37.1 C)  TempSrc: Oral Oral Oral Oral  SpO2: 97% 96% 94% 96%  Weight:   52.4 kg   Height:        Intake/Output Summary (Last 24 hours) at 06/24/2018 0746 Last data filed at 06/24/2018 0526 Gross per 24 hour  Intake 360 ml  Output 100 ml  Net 260 ml   Last 3 Weights 06/24/2018 06/23/2018 06/22/2018  Weight (lbs) 115 lb 9.6 oz 114 lb 11.2 oz 114 lb  Weight (kg) 52.436 kg 52.028 kg 51.71 kg      Telemetry    Sinus rhythm with heart rates in the 70's to low 100's.  - Personally Reviewed  ECG    Normal sinus rhythm with LBBB. - Personally Reviewed  Physical Exam   GEN: Thin African-American male resting comfortably. Alert and in no acute distress.   Neck: Supple. No JVD appreciated. Cardiac: RRR. No significant murmurs, rubs, or gallops appreciated. Respiratory: Diminished and  course breath sounds bilaterally. Diffuse expiratory wheezing also noted. GI: Abdomen soft, non-distended, and non-tender. Bowel sounds present. MS: No lower extremity edema. Skin: Warm and dry. Neuro:  No focal deficits. Psych: Normal affect.   Labs    Chemistry Recent Labs  Lab 06/22/18 1355 06/22/18 2039 06/23/18 0349 06/24/18 0518  NA 136 137 140 138  K 3.1* 3.5 4.1 4.0  CL 104 100 108 105  CO2 18* 22 20* 19*  GLUCOSE 131* 134* 117* 102*  BUN 16 19 28* 46*  CREATININE 1.05 1.30* 1.29* 1.51*  CALCIUM 8.8* 8.8* 9.2 9.2  PROT 8.9*  --   --   --   ALBUMIN 4.4  --   --   --   AST 43*  --   --   --   ALT 15  --   --   --   ALKPHOS 70  --   --   --   BILITOT 0.7  --   --   --   GFRNONAA >60 58* 58* 48*  GFRAA >60 >60 >60 56*  ANIONGAP 14 15 12 14      Hematology Recent Labs  Lab 06/22/18 1355 06/24/18 0518  WBC 11.3* 7.2  RBC 5.06 5.29  HGB 14.4 14.8  HCT 45.0 46.6  MCV 88.9 88.1  MCH 28.5 28.0  MCHC 32.0 31.8  RDW 20.2* 20.0*  PLT 190 180    Cardiac Enzymes Recent Labs  Lab 06/22/18 1355 06/22/18 2039  TROPONINI 0.38* 0.49*   No results for input(s): TROPIPOC in the last 168 hours.   BNP Recent Labs  Lab 06/22/18 1355  BNP 4,095.8*     DDimer No results for input(s): DDIMER in the last 168 hours.   Radiology    Dg Chest 2 View  Result Date: 06/22/2018 CLINICAL DATA:  Cough and congestion in a cigarette smoker. EXAM: CHEST - 2 VIEW COMPARISON:  Single-view of the chest 11/10/2015. CT chest 12/10/2014. FINDINGS: The patient is status post CABG. Heart size is upper normal. Lungs are clear. No pneumothorax or pleural effusion. No acute or focal bony abnormality. IMPRESSION: No acute disease. Electronically Signed   By: Drusilla Kanner M.D.   On: 06/22/2018 15:05    Cardiac Studies   Echocardiogram 06/23/2018: Impressions:  1. The left ventricle has severely reduced systolic function of 25-30%. The cavity size is mildly increased. There is no left  ventricular wall thickness. Echo evidence of impaired relaxation diastolic filling patterns. Normal left ventricular filling  pressures. There is abnormal septal motion consistent with left bundle branch block.  2. There is spontaneous echo contrast consistent with low cardiac output/slow flow, but there is no left ventricular thrombus.     There is severe septal-lateral dyssynchrony.  3. The right ventricle is in size. There is normal systolic function. Right ventricular systolic pressure is mildly elevated with an estimated pressure of 27.2 mmHg.  4. Mildly dilated left atrial size.  5. Normal right atrial size.  6. The mitral valve is normal in structure. Regurgitation is mild by color flow Doppler.  7. The aortic valve is tricuspid. Aortic valve regurgitation is moderate by color flow Doppler.  8. No atrial level shunt detected by color flow Doppler.  Patient Profile   Jeremy Powers is a 63 y.o. male with a history of CAD with NSTEMI in 2015 s/p PCI to LAD in 2015 and 2016 and s/p CABG in 2017, aortic regurgitation, mitral regurgitation, and severe iron deficiency anemia (planned capsule endoscopy) who presented to the [facility] ED on 06/22/2018 for increasing shortness of breath and cough. Cardiology was consulted for evaluation of CHF.   Assessment & Plan    Acute Combined CHF - Echo this admission showed LVEF of 25-30% with severe septal-lateral dyssynchrony and  impaired diastolic filling patterns.  - BNP elevated at 4, 095.8. - Documented output of 400 mL in the last 2 days. Unsure if this is accurate.  - Last dose of IV Lasix appears to have been on 06/22/2018. - Patient reports shortness of breath has improved. Course breath sounds noted on exam but no JVD or edema. - Continue Lopressor 25mg  twice daily. - ACEi/ARB on hold due to renal function. - Continue to monitor daily weights, strict I/O's, and renal function.  Chest Pain with Elevated - Troponin minimally elevated at 0.38 >>  0.49. - Echo showed LVEF of 25-30% (down from 50-55% in 12/2017 on Echo at [facility]). - Left heart catheterization planned for today. However, serum creatinine 1.51 (up from 1.29 yesterday). May need IV fluids prior to cath but given reduced EF would be cautious. Will discuss with MD.  AKI - Serum creatinine 1.05 on admission. Increased to 1.30 after one dose of IV Lasix. - Serum creatinine 1.51 this morning.  - Will continue to monitor closely.  History of GI Bleed in 03/2018 - Hemoglobin 14.8 today. - Planned for outpatient capsule endoscopy.  Leukocytosis - Resolved - Mild leukocytosis on admission with WBC of 11.3. - Improved with WBC of 7.2 today.   For questions or updates, please contact [facility] Please consult [email or web address] for contact info under        Signed, Jeremy ParkerCallie E Goodrich, Jeremy Powers  06/24/2018, 7:46 AM   ---------------------------------------------------------------------------------------------   History and all data above reviewed.  Patient examined.  I agree with the findings as above.  Jeremy Powers c/o a headache and thirst but is otherwise without acute concern. He is npo for coronary angiogram today to investigate reduced ejection fraction.   Constitutional: No acute distress ENT: poor dentition, nearly edentulous Cardiovascular: regular rhythm, normal rate, no murmurs. S1 and S2 normal. Radial pulses normal bilaterally. No jugular venous distention.  Respiratory: coarse in the bilateral bases GI : normal bowel sounds, soft and nontender. No distention.   MSK: extremities warm, well perfused. No edema.  NEURO: grossly nonfocal exam, moves all extremities. PSYCH: alert and oriented x 3, normal mood and affect.   All available labs, radiology testing, previous records reviewed. Agree with documented assessment and plan of my colleague as stated above with the following additions or changes:  Principal Problem:   Acute on chronic congestive heart  failure (HCC) Active Problems:   Aortic insufficiency   Elevated troponin   CAD (coronary artery disease)   Left bundle branch block   History of iron deficiency anemia   Acute on chronic heart failure (HCC)   Acute systolic (congestive) heart failure (HCC)    Plan: I will consult with my colleagues in advanced heart failure. I believe he has a low output state, and is not wet. BNP significant elevated. Cr elevation may be due to poor perfusion with low output vs congestive renal dysfunction. Plan for evaluation by HF team and likely R+L HC today.   Time Spent Directly with Patient:  I have spent a total of 35 minutes with the patient reviewing hospital notes, telemetry, EKGs, labs and examining the patient as well as establishing an assessment and plan that was discussed personally with the patient.  > 50% of time was spent in direct patient care.  Length of Stay:  LOS: 0 days   Parke PoissonGayatri A Acharya, MD [facility] 2:00 PM  06/24/2018

## 2018-06-24 NOTE — Interval H&P Note (Signed)
History and Physical Interval Note:  06/24/2018 2:39 PM  Jeremy Powers  has presented today for surgery, with the diagnosis of unstable angina and chf  The various methods of treatment have been discussed with the patient and family. After consideration of risks, benefits and other options for treatment, the patient has consented to  Procedure(s): RIGHT/LEFT HEART CATH AND CORONARY/GRAFT ANGIOGRAPHY (N/A) and possible coronary angioplastyas a surgical intervention .  The patient's history has been reviewed, patient examined, no change in status, stable for surgery.  I have reviewed the patient's chart and labs.  Questions were answered to the patient's satisfaction.     Daniel Bensimhon

## 2018-06-24 NOTE — Progress Notes (Signed)
Received patient back from cath lab. Patient is alert and oriented to person, place and situation. Left radial at level 0.

## 2018-06-24 NOTE — H&P (View-Only) (Signed)
  Advanced Heart Failure Team Consult Note   Primary Physician: [facility] PCP-Cardiologist:  Dr Manzi Folk ([facility])  Reason for Consultation: Acute systolic HF  HPI:    Jeremy Powers is seen today for evaluation of Acute systolic HF at the request of Dr Rose.  Jeremy Powers is a 62 y.o. male with a history of CAD with NSTEMI, s/p PCI to LAD in 2015 and 2016, s/p CABG 2017, microcytic/iron deficiency anemia followed by hematology and GI, LBBB, and tobacco use.  Last seen by his cardiology Dr Folk ([facility]) 03/2018. He was having exertional CP, but had a recent normal nuclear study 12/2017. Echo 12/2017 (done to evaluate murmur) showed normal EF 55-60%. Lopressor was added to his regimen. He was having financial difficulties (no insurance) and was not compliant with all medications, but was taking his ASA and lisinopril. Routine bloodwork revealed hemoglobin of 4.9 and he was sent to ER.   He was admitted at [facility] 11/14-11/19/19 with severe anemia. Hemoglobin was 4.9 with no obvious source of bleeding. He received 3 units pRBCs. Iron studies showed severe iron deficiency. He was given IV venofer. EGD and colonoscopy negative. Now follows with hematology and GI as outpatient. Planned for outpatient capsule enteroscopy.  He presented to [facility] ED on 2/2 with cough, SOB, and orthopnea. Stopped meds 1 month ago, but restarted metoprolol, ASA, and iron on day prior to presentation. He ran out of his medications and was unable to afford refills. His copays are $3 each. He was tachycardiac and tachypnic on presentation to [facility] ED. He was given bronchodilators and lasix and was sent to [facility] for admission. Pertinent admission labs: troponin 0.38 > 0.49, BNP 4095, WBC 11.3, hemoglobin 14.4, K 3.1, creatinine 1.05 CXR: mild hyperinflation, no significant acute pulmonary disease or masses.  EKG: NSR with LBBB 82 bpm.   Cardiology admitted and  ordered repeat echo, which showed newly decreased EF 25-30% (50-55% in 12/2017). He was scheduled for LHC for this afternoon, but this was canceled due to elevated creatinine 1.51  He feels okay. Denies any change in his CP. He has CP and SOB after exertion, which resolves with rest. He is a poor historian.   SH: current smoker 1/2 ppd. He does not drive. He walks anywhere he needs to go. Has medicaid, but has trouble paying $3 copays for medications. Rare ETOH use. Rare marijuana use. Lives with his "friend girl"  FH: no known hx of CAD or CHF. Both parents are deceased.   Echo 06/23/18: EF 25-30%, several septal-lateral dyssynchrony, RV normal, mildly dilated LA, mild MR, mod TR, pontaneous echo contrast consistent with low cardiac output/slow flow, but there is no left ventricular thrombus  Echo 01/15/18 ([facility]): EF 50-55%, mild MR, moderate AI  Echo 07/2015: "normal LV size and systolic function"  Nuclear study 12/26/17: negative for ischemia  LHC 10/2015: prior to CABG 1. Severe in-stent restenosis of mid-LAD at D1/D2 bifurcations. Recurrent. 2. Otherwise, nonobstructive CAD. 3. Severely reduced LV systolic function, EF 25% 4. Global LV hypokinesis, out of proportion to CAD. 5. Normal PA pressure. 6. Normal RA, PCWP, and LVEDP, consistent with a compensated, chronic CHF  Review of Systems: [y] = yes, [ ] = no   . General: Weight gain [ ]; Weight loss [ ]; Anorexia [ ]; Fatigue [ ]; Fever [ ]; Chills [ ]; Weakness [ ]  . Cardiac: Chest pain/pressure [ y];   Resting SOB [ ]; Exertional SOB [ ]y; Orthopnea [ ]; Pedal Edema [ ]; Palpitations [ ]; Syncope [ ]; Presyncope [ ]; Paroxysmal nocturnal dyspnea[ ]  . Pulmonary: Cough [ ]; Wheezing[ ]; Hemoptysis[ ]; Sputum [ ]; Snoring [ ]  . GI: Vomiting[ ]; Dysphagia[ ]; Melena[ ]; Hematochezia [ ]; Heartburn[ ]; Abdominal pain [ ]; Constipation [ ]; Diarrhea [ ]; BRBPR [ ]  . GU: Hematuria[ ]; Dysuria [ ]; Nocturia[ ]  . Vascular: Pain in legs  with walking [ ]; Pain in feet with lying flat [ ]; Non-healing sores [ ]; Stroke [ ]; TIA [ ]; Slurred speech [ ];  . Neuro: Headaches[ ]; Vertigo[ ]; Seizures[ ]; Paresthesias[ ];Blurred vision [ ]; Diplopia [ ]; Vision changes [ ]  . Ortho/Skin: Arthritis [ ]; Joint pain [ ]; Muscle pain [ ]; Joint swelling [ ]; Back Pain [ ]; Rash [ ]  . Psych: Depression[ ]; Anxiety[ ]  . Heme: Bleeding problems [ y]; Clotting disorders [ ]; Anemia [y ]  . Endocrine: Diabetes [ ]; Thyroid dysfunction[ ]  Home Medications Prior to Admission medications   Medication Sig Start Date End Date Taking? Authorizing Provider  aspirin EC 81 MG tablet Take by mouth. 10/05/15 09/01/18 Yes [provider]  ferrous sulfate 325 (65 FE) MG tablet Take 325 mg by mouth 2 (two) times daily with a meal.   Yes [provider]  lisinopril (PRINIVIL,ZESTRIL) 10 MG tablet Take by mouth. 06/03/18 09/01/18 Yes [provider]  metoprolol tartrate (LOPRESSOR) 25 MG tablet Take 25 mg by mouth 2 (two) times daily.  06/11/16 09/01/18 Yes [provider]    Past Medical History: Past Medical History:  Diagnosis Date  . Coronary artery disease   . Hypertension     Past Surgical History: Past Surgical History:  Procedure Laterality Date  . CARDIAC SURGERY      Family History: No family history on file.  Social History: Social History   Socioeconomic History  . Marital status: Single    Spouse name: Not on file  . Number of children: Not on file  . Years of education: Not on file  . Highest education level: Not on file  Occupational History  . Not on file  Social Needs  . Financial resource strain: Not on file  . Food insecurity:    Worry: Not on file    Inability: Not on file  . Transportation needs:    Medical: Not on file    Non-medical: Not on file  Tobacco Use  . Smoking status: Current Every Day Smoker    Packs/day: 0.50    Types: Cigarettes  . Smokeless tobacco: Never  Used  Substance and Sexual Activity  . Alcohol use: Yes    Alcohol/week: 2.0 standard drinks    Types: 2 Cans of beer per week    Comment: 2 40oz beer daily  . Drug use: Yes    Types: Marijuana  . Sexual activity: Not on file  Lifestyle  . Physical activity:    Days per week: Not on file    Minutes per session: Not on file  . Stress: Not on file  Relationships  . Social connections:    Talks on phone: Not on file    Gets together: Not on file    Attends religious service: Not on file    Active member of club or organization: Not on file    Attends meetings of clubs   or organizations: Not on file    Relationship status: Not on file  Other Topics Concern  . Not on file  Social History Narrative  . Not on file    Allergies:  No Known Allergies  Objective:    Vital Signs:   Temp:  [98.5 F (36.9 C)-99.5 F (37.5 C)] 98.8 F (37.1 C) (02/04 1138) Pulse Rate:  [68-88] 68 (02/04 1138) Resp:  [18-19] 19 (02/04 1138) BP: (127-137)/(66-76) 131/66 (02/04 1138) SpO2:  [94 %-97 %] 97 % (02/04 1138) Weight:  [52.4 kg] 52.4 kg (02/04 0520) Last BM Date: 06/23/18  Weight change: Filed Weights   06/22/18 1927 06/23/18 0508 06/24/18 0520  Weight: 51.7 kg 52 kg 52.4 kg    Intake/Output:   Intake/Output Summary (Last 24 hours) at 06/24/2018 1142 Last data filed at 06/24/2018 0526 Gross per 24 hour  Intake 240 ml  Output 100 ml  Net 140 ml      Physical Exam    General:  Thin.  No resp difficulty HEENT: poor dentation Neck: supple. JVP ~7. Carotids 2+ bilat; no bruits. No lymphadenopathy or thyromegaly appreciated. Cor: PMI nondisplaced. Regular rate & rhythm. No rubs, gallops or murmurs. Lungs: clear Abdomen: soft, nontender, nondistended. No hepatosplenomegaly. No bruits or masses. Good bowel sounds. Extremities: no cyanosis, clubbing, rash, edema Neuro: alert & orientedx3, cranial nerves grossly intact. moves all 4 extremities w/o difficulty. Affect  pleasant   Telemetry   NSR 60s with LBBB. Personally reviewed.   EKG    NSR with LBBB 82 bpm. Personally reviewed.   Labs   Basic Metabolic Panel: Recent Labs  Lab 06/22/18 1355 06/22/18 2039 06/23/18 0349 06/24/18 0518  NA 136 137 140 138  K 3.1* 3.5 4.1 4.0  CL 104 100 108 105  CO2 18* 22 20* 19*  GLUCOSE 131* 134* 117* 102*  BUN 16 19 28* 46*  CREATININE 1.05 1.30* 1.29* 1.51*  CALCIUM 8.8* 8.8* 9.2 9.2  MG  --   --  2.2 2.2    Liver Function Tests: Recent Labs  Lab 06/22/18 1355  AST 43*  ALT 15  ALKPHOS 70  BILITOT 0.7  PROT 8.9*  ALBUMIN 4.4   No results for input(s): LIPASE, AMYLASE in the last 168 hours. No results for input(s): AMMONIA in the last 168 hours.  CBC: Recent Labs  Lab 06/22/18 1355 06/24/18 0518  WBC 11.3* 7.2  NEUTROABS 9.9*  --   HGB 14.4 14.8  HCT 45.0 46.6  MCV 88.9 88.1  PLT 190 180    Cardiac Enzymes: Recent Labs  Lab 06/22/18 1355 06/22/18 2039  TROPONINI 0.38* 0.49*    BNP: BNP (last 3 results) Recent Labs    06/22/18 1355  BNP 4,095.8*    ProBNP (last 3 results) No results for input(s): PROBNP in the last 8760 hours.   CBG: No results for input(s): GLUCAP in the last 168 hours.  Coagulation Studies: Recent Labs    06/22/18 1355  LABPROT 16.1*  INR 1.30     Imaging   Dg Chest 2 View  Result Date: 06/24/2018 CLINICAL DATA:  Shortness of breath for several days EXAM: CHEST - 2 VIEW COMPARISON:  06/22/2018 FINDINGS: Cardiac shadow is within normal limits. Postsurgical changes are again noted and stable. The lungs are hyperaerated without focal infiltrate or sizable effusion. No bony abnormality is noted. IMPRESSION: COPD without acute abnormality. Electronically Signed   By: Mark  Lukens M.D.   On: 06/24/2018 11:06        Medications:     Current Medications: . aspirin EC  81 mg Oral Daily  . enoxaparin (LOVENOX) injection  40 mg Subcutaneous Q24H  . Influenza vac split quadrivalent PF   0.5 mL Intramuscular Tomorrow-1000  . metoprolol tartrate  25 mg Oral BID  . sodium chloride flush  3 mL Intravenous Q12H  . sodium chloride flush  3 mL Intravenous Q12H     Infusions: . sodium chloride    . sodium chloride    . sodium chloride 10 mL/hr at 06/24/18 0559       Patient Profile   Jeremy Powers is a 62 y.o. male with a history of CAD with NSTEMI, s/p PCI to LAD in 2015 and 2016, s/p CABG 2017, microcytic/iron deficiency anemia followed by hematology and GI, LBBB, and tobacco use.  Presented to [facility] ED with SOB and cough on 2/2. Sent to [facility] for further evaluation.   Assessment/Plan   1. Acute systolic HF - Newly reduced EF to 25-30%. Prior echo 12/2017: EF 55-60%. Normal nuclear study in 12/2017. No cath since prior to his CABG - Volume looks okay. Lasix has been on hold.  - Hold BB until we know his cardiac output.  - Hold off on starting ACEi/ARNI/ARB/spiro with elevated creatinine. He was prescribed lisinopril 5 mg daily PTA. - If output low, start digoxin - Consider bidil, but I doubt he will be compliant with TID medication.  - Plan R/LHC today.   2. CAD s/p CABG 2017 - Troponin 0.38 -> 0.49 - Has chronic exertional CP, no change in frequency or severity - Going for LHC today.  - Continue ASA. Start statin. Check lipid panel in am.  - BB on hold. He is on lopressor 25 mg BID at home. Will switch to Toprol XL if output okay.  3. LBBB - Chronic, at least since 2017 - If EF remains low, would qualify for CRT-D  4. Tobacco use - Encourages cessation.  5. Microcytic/iron deficiency anemia  - Admitted 03/2018 with hemoglobin 4.9. Colonoscopy and EGD negative. Found to have severe iron deficiency - Followed by hematology and GI as outpatient. Planning for capsule study as outpatient - Hemoglobin stable 14.8  6. AKI - Creatinine has trended up from 1.0 on admit to 1.51 today. He received 40 mg IV lasix on 2/2. - Monitor daily BMET  Medication concerns  reviewed with patient and pharmacy team. Barriers identified: compliance, transportation, affording medications  Length of Stay: 0  Ashley M Smith, NP  06/24/2018, 11:42 AM  Advanced Heart Failure Team Pager [phone] (M-F; 7a - 4p)  Please contact [facility] for night-coverage after hours (4p -7a ) and weekends on [email or web address]  Patient seen and examined with the above-signed Advanced Practice Provider and/or Housestaff. I personally reviewed laboratory data, imaging studies and relevant notes. I independently examined the patient and formulated the important aspects of the plan. I have edited the note to reflect any of my changes or salient points. I have personally discussed the plan with the patient and/or family.  62 y/o male with CAD s/p CABG 2017, LBBB and tobacco use admitted with progressive exertional dyspnea and chest tightness with NYHA IIIB symptoms. Echo shows newly reduced EF 25-30%. BNP markedly elevated but does not appear volume overloaded on exam. Creatinine trending up. Concern for loss of CABG grafts. Will plan R/L cath today. Further decision making based on results of cath. D/w Dr. Acharya.   Savannaha Stonerock, MD  2:18 PM    

## 2018-06-25 ENCOUNTER — Encounter (HOSPITAL_COMMUNITY): Payer: Self-pay | Admitting: Internal Medicine

## 2018-06-25 DIAGNOSIS — I5023 Acute on chronic systolic (congestive) heart failure: Secondary | ICD-10-CM

## 2018-06-25 LAB — LIPID PANEL
Cholesterol: 110 mg/dL (ref 0–200)
HDL: 31 mg/dL — ABNORMAL LOW (ref >40)
LDL Cholesterol: 61 mg/dL (ref 0–99)
Total CHOL/HDL Ratio: 3.5 RATIO
Triglycerides: 91 mg/dL (ref <150)
VLDL: 18 mg/dL (ref 0–40)

## 2018-06-25 LAB — CBC WITH DIFFERENTIAL/PLATELET
Abs Immature Granulocytes: 0 10*3/uL (ref 0.00–0.07)
Basophils Absolute: 0 10*3/uL (ref 0.0–0.1)
Basophils Relative: 0 %
Eosinophils Absolute: 0 10*3/uL (ref 0.0–0.5)
Eosinophils Relative: 0 %
HCT: 45 % (ref 39.0–52.0)
Hemoglobin: 14.2 g/dL (ref 13.0–17.0)
Lymphocytes Relative: 12 %
Lymphs Abs: 1.3 10*3/uL (ref 0.7–4.0)
MCH: 27.6 pg (ref 26.0–34.0)
MCHC: 31.6 g/dL (ref 30.0–36.0)
MCV: 87.4 fL (ref 80.0–100.0)
Monocytes Absolute: 0.8 10*3/uL (ref 0.1–1.0)
Monocytes Relative: 7 %
Neutro Abs: 8.8 10*3/uL — ABNORMAL HIGH (ref 1.7–7.7)
Neutrophils Relative %: 81 %
Platelets: 164 10*3/uL (ref 150–400)
RBC: 5.15 MIL/uL (ref 4.22–5.81)
RDW: 19.4 % — ABNORMAL HIGH (ref 11.5–15.5)
WBC: 10.9 10*3/uL — AB (ref 4.0–10.5)
nRBC: 0 % (ref 0.0–0.2)
nRBC: 0 /100 WBC

## 2018-06-25 LAB — BASIC METABOLIC PANEL
Anion gap: 14 (ref 5–15)
BUN: 38 mg/dL — ABNORMAL HIGH (ref 8–23)
CO2: 18 mmol/L — ABNORMAL LOW (ref 22–32)
Calcium: 8.4 mg/dL — ABNORMAL LOW (ref 8.9–10.3)
Chloride: 101 mmol/L (ref 98–111)
Creatinine, Ser: 1.43 mg/dL — ABNORMAL HIGH (ref 0.61–1.24)
GFR calc Af Amer: 60 mL/min — ABNORMAL LOW (ref >60)
GFR calc non Af Amer: 51 mL/min — ABNORMAL LOW (ref >60)
Glucose, Bld: 100 mg/dL — ABNORMAL HIGH (ref 70–99)
POTASSIUM: 3.7 mmol/L (ref 3.5–5.1)
Sodium: 133 mmol/L — ABNORMAL LOW (ref 135–145)

## 2018-06-25 LAB — MAGNESIUM: Magnesium: 2.3 mg/dL (ref 1.7–2.4)

## 2018-06-25 MED ORDER — AZITHROMYCIN 250 MG PO TABS
250.0000 mg | ORAL_TABLET | Freq: Every day | ORAL | Status: DC
  Administered 2018-06-26 – 2018-06-27 (×2): 250 mg via ORAL
  Filled 2018-06-25 (×2): qty 1

## 2018-06-25 MED ORDER — SPIRONOLACTONE 12.5 MG HALF TABLET
12.5000 mg | ORAL_TABLET | Freq: Every day | ORAL | Status: DC
  Administered 2018-06-25: 12.5 mg via ORAL
  Filled 2018-06-25 (×2): qty 1

## 2018-06-25 MED ORDER — SODIUM CHLORIDE 0.9 % IV SOLN
1.0000 g | INTRAVENOUS | Status: DC
  Administered 2018-06-25 – 2018-06-27 (×3): 1 g via INTRAVENOUS
  Filled 2018-06-25 (×3): qty 10

## 2018-06-25 MED ORDER — LOSARTAN POTASSIUM 25 MG PO TABS
12.5000 mg | ORAL_TABLET | Freq: Every day | ORAL | Status: DC
  Administered 2018-06-25: 12.5 mg via ORAL
  Filled 2018-06-25 (×2): qty 1

## 2018-06-25 MED ORDER — AZITHROMYCIN 500 MG PO TABS
500.0000 mg | ORAL_TABLET | Freq: Every day | ORAL | Status: AC
  Administered 2018-06-25: 500 mg via ORAL
  Filled 2018-06-25: qty 1

## 2018-06-25 NOTE — Progress Notes (Addendum)
Advanced Heart Failure Rounding Note  PCP-Cardiologist: No primary care provider on file.   Subjective:    Tmax 102.9. CXR yesterday with "COPD without acute abnormality"  Feeling OK this am. Had fever overnight as above. Denies chills, but had a HA. Having yellow productive cough. Lives with a friend. Has to get people to give him rides to appointments. He has Medicaid, but has a very limited income. Adalberto Ill his friend to stay with her, and for rides.   R/LHC 06/24/2018 RA = 1 RV = 19/3 PA = 24/5 (14) PCW = 7 Fick cardiac output/index = 4.0/2.5 PVR = 1.9 WU Ao sat = 95% PA sat = 68%, 71%  Assessment: 1. Severe 1-vessel CAD with occluded LAD within previous stent 2. Normal RCA and LCX 3. LIMA to LAD widely patent 4. SVG - D2 widely patent 5. SVG - D1 likely occluded (grafts not marked)  Objective:   Weight Range: 52.3 kg Body mass index is 18.59 kg/m.   Vital Signs:   Temp:  [97.5 F (36.4 C)-102.9 F (39.4 C)] 100.3 F (37.9 C) (02/05 0510) Pulse Rate:  [62-115] 105 (02/05 0510) Resp:  [11-24] 18 (02/05 0510) BP: (108-148)/(62-87) 123/67 (02/05 0510) SpO2:  [0 %-100 %] 97 % (02/05 0510) Weight:  [52.3 kg] 52.3 kg (02/05 0510) Last BM Date: 06/23/18  Weight change: Filed Weights   06/23/18 0508 06/24/18 0520 06/25/18 0510  Weight: 52 kg 52.4 kg 52.3 kg    Intake/Output:   Intake/Output Summary (Last 24 hours) at 06/25/2018 0832 Last data filed at 06/25/2018 0513 Gross per 24 hour  Intake 243 ml  Output 600 ml  Net -357 ml     Physical Exam    General:  Thin. NAD. Hot to the touch.  HEENT: Poor dentition.  Neck: Supple. JVP not elevated. Carotids 2+ bilat; no bruits. No lymphadenopathy or thyromegaly appreciated. Cor: PMI nondisplaced. Regular. Tachy. No rubs, gallops or murmurs. Lungs: Scattered rhonchi that clear with cough.  Abdomen: Soft, nontender, nondistended. No hepatosplenomegaly. No bruits or masses. Good bowel sounds. Extremities: No  cyanosis, clubbing, rash, or edema Neuro: Alert & orientedx3, cranial nerves grossly intact. moves all 4 extremities w/o difficulty. Affect pleasant   Telemetry   NSR/Sinus tach 90-100s with LBBB, personally reviewed.   EKG    No new tracings.    Labs    CBC Recent Labs    06/22/18 1355 06/24/18 0518 06/24/18 1513 06/24/18 1521  WBC 11.3* 7.2  --   --   NEUTROABS 9.9*  --   --   --   HGB 14.4 14.8 13.9 15.0  14.6  HCT 45.0 46.6 41.0 44.0  43.0  MCV 88.9 88.1  --   --   PLT 190 180  --   --    Basic Metabolic Panel Recent Labs    40/98/11 0518  06/24/18 1521 06/25/18 0644  NA 138   < > 140  141 133*  K 4.0   < > 3.7  3.5 3.7  CL 105  --   --  101  CO2 19*  --   --  18*  GLUCOSE 102*  --   --  100*  BUN 46*  --   --  38*  CREATININE 1.51*  --   --  1.43*  CALCIUM 9.2  --   --  8.4*  MG 2.2  --   --  2.3   < > = values in this interval not displayed.  Liver Function Tests Recent Labs    06/22/18 1355  AST 43*  ALT 15  ALKPHOS 70  BILITOT 0.7  PROT 8.9*  ALBUMIN 4.4   No results for input(s): LIPASE, AMYLASE in the last 72 hours. Cardiac Enzymes Recent Labs    06/22/18 1355 06/22/18 2039  TROPONINI 0.38* 0.49*    BNP: BNP (last 3 results) Recent Labs    06/22/18 1355  BNP 4,095.8*    ProBNP (last 3 results) No results for input(s): PROBNP in the last 8760 hours.   D-Dimer No results for input(s): DDIMER in the last 72 hours. Hemoglobin A1C No results for input(s): HGBA1C in the last 72 hours. Fasting Lipid Panel Recent Labs    06/25/18 0644  CHOL 110  HDL 31*  LDLCALC 61  TRIG 91  CHOLHDL 3.5   Thyroid Function Tests No results for input(s): TSH, T4TOTAL, T3FREE, THYROIDAB in the last 72 hours.  Invalid input(s): FREET3  Other results:   Imaging    Dg Chest 2 View  Result Date: 06/24/2018 CLINICAL DATA:  Shortness of breath for several days EXAM: CHEST - 2 VIEW COMPARISON:  06/22/2018 FINDINGS: Cardiac shadow is  within normal limits. Postsurgical changes are again noted and stable. The lungs are hyperaerated without focal infiltrate or sizable effusion. No bony abnormality is noted. IMPRESSION: COPD without acute abnormality. Electronically Signed   By: Alcide Clever M.D.   On: 06/24/2018 11:06   [place] Ekg Site Rite  Result Date: 06/24/2018 If Site Rite image not attached, placement could not be confirmed due to current cardiac rhythm.     Medications:     Scheduled Medications: . aspirin EC  81 mg Oral Daily  . atorvastatin  40 mg Oral q1800  . enoxaparin (LOVENOX) injection  40 mg Subcutaneous Q24H  . Influenza vac split quadrivalent PF  0.5 mL Intramuscular Tomorrow-1000  . sodium chloride flush  3 mL Intravenous Q12H     Infusions: . sodium chloride       PRN Medications:  sodium chloride, acetaminophen, albuterol, ondansetron (ZOFRAN) IV, sodium chloride flush    Patient Profile   Jeremy Powers is a 63 y.o. male with a history of CAD with NSTEMI, s/p PCI to LAD in 2015 and 2016, s/p CABG 2017, microcytic/iron deficiency anemia followed by hematology and GI, LBBB, and tobacco use.  Presented to [facility] ED with SOB and cough on 2/2. Sent to [facility] for further evaluation.   Assessment/Plan   1. Acute systolic HF - Newly reduced EF to 25-30%. Prior echo 12/2017: EF 55-60%. Normal nuclear study in 12/2017. No cath since prior to his CABG - Volume looks stable to low on cath. Holding lasix.  - Hold BB for now with mildly reduced cardiaac output. Will aim to start low dose prior to discharge.  - Cr relatively stable. Will start low dose losartan for now, with plans to transition to [facility] as tolerated.  - Start losartan 12.5 mg daily.  - Start spiro 12.5 mg daily.  - Consider bidil, but I doubt he will be compliant with TID medication.  - Would benefit from paramedicineif friend agrees to let them come in house.  2. CAD s/p CABG 2017 - CAD and revascularization stable on [facility] 06/24/2018 -  Troponin 0.38 -> 0.49 - Has chronic exertional CP, no change in frequency or severity - Continue ASA. Start statin.  - Lipids: Chol 110, TG 91, HDL 31, LDL 61.  - BB on hold. He is on lopressor  25 mg BID at home. Will switch to Toprol XL if output okay.  3. LBBB - Chronic, at least since 2017 - If EF remains low, would qualify for CRT-D if EF remains low with GDMT.   4. Tobacco use - Encouraged cessation.   5. Microcytic/iron deficiency anemia  - Admitted 03/2018 with hemoglobin 4.9. Colonoscopy and EGD negative. Found to have severe iron deficiency - Followed by hematology and GI as outpatient. Planning for capsule study as outpatient - Hemoglobin stable 14.6   6. AKI - Creatinine has trended up from 1.0 on admit to 1.51 today. He received 40 mg IV lasix on 2/2. - Cr 1.43 today. Follow.   7. Fever - Tmax 102.9 with tachycardia and productive (yellow) couch. BCx sent - Needs ABX. Discussed with Pharm-D. Will cover for CAP with Rocephin.   Medication concerns reviewed with patient and pharmacy team. Barriers identified: Compliance, transportation, affording medications.   Length of Stay: 1  Luane School  06/25/2018, 8:32 AM  Advanced Heart Failure Team Pager [phone] (M-F; 7a - 4p)  Please contact [facility] for night-coverage after hours (4p -7a ) and weekends on [email or web address]    Patient seen and examined with the above-signed Advanced Practice Provider and/or Housestaff. I personally reviewed laboratory data, imaging studies and relevant notes. I independently examined the patient and formulated the important aspects of the plan. I have edited the note to reflect any of my changes or salient points. I have personally discussed the plan with the patient and/or family.  Cath results reviewed with him and better than expected. Developed fever last night with productive sputum and now on abx to cover CAP. Volume status ok. Renal function stable. Reason for drop  in EF unclear. Agree with startin spiro and losartan. Consider cMRI if creatinine stable.   Arvilla Meres, MD  10:38 PM

## 2018-06-26 LAB — BASIC METABOLIC PANEL
Anion gap: 13 (ref 5–15)
BUN: 51 mg/dL — ABNORMAL HIGH (ref 8–23)
CO2: 18 mmol/L — ABNORMAL LOW (ref 22–32)
CREATININE: 1.74 mg/dL — AB (ref 0.61–1.24)
Calcium: 8.6 mg/dL — ABNORMAL LOW (ref 8.9–10.3)
Chloride: 104 mmol/L (ref 98–111)
GFR calc Af Amer: 47 mL/min — ABNORMAL LOW (ref >60)
GFR calc non Af Amer: 41 mL/min — ABNORMAL LOW (ref >60)
Glucose, Bld: 110 mg/dL — ABNORMAL HIGH (ref 70–99)
Potassium: 3.6 mmol/L (ref 3.5–5.1)
Sodium: 135 mmol/L (ref 135–145)

## 2018-06-26 LAB — MAGNESIUM: MAGNESIUM: 2.5 mg/dL — AB (ref 1.7–2.4)

## 2018-06-26 MED ORDER — SPIRONOLACTONE 12.5 MG HALF TABLET
12.5000 mg | ORAL_TABLET | Freq: Every day | ORAL | Status: DC
  Administered 2018-06-26: 12.5 mg via ORAL
  Filled 2018-06-26: qty 1

## 2018-06-26 MED ORDER — LOSARTAN POTASSIUM 25 MG PO TABS
12.5000 mg | ORAL_TABLET | Freq: Every day | ORAL | Status: DC
  Administered 2018-06-26: 12.5 mg via ORAL
  Filled 2018-06-26: qty 1

## 2018-06-26 MED ORDER — SODIUM CHLORIDE 0.9 % IV BOLUS
250.0000 mL | Freq: Once | INTRAVENOUS | Status: AC
  Administered 2018-06-26: 250 mL via INTRAVENOUS

## 2018-06-26 NOTE — Care Management Note (Deleted)
Case Management Note  Patient Details  Name: Jeremy Powers MRN: [id number] Date of Birth: 09/17/1955  Subjective/Objective:  CHF                 Action/Plan: Patient lives at home alone; PCP is Dr  Rory McEathron with [facility]; has private insurance with [employer] with prescription drug coverage; pharmacy of choice [facility] [address]; patient states that she does not use any salt in her diet and has scales and knows to weigh herself daily; patient could benefit from a Disease Management program for CHF; patient requested [facility]; Dan with [facility] called for arrangements. Patient also stated " I think I need oxygen when I go home." CM will continue to follow for progression of care and possibly of home oxygen.  Expected Discharge Date:    possibly 06/30/2018               Expected Discharge Plan:  Home/Self Care  Discharge planning Services  CM Consult  HH Arranged:  RN, Disease Management, PT HH Agency:  [facility]  Status of Service:  In process, will continue to follow  Shakina Choy L, RN,MHA,BSN [phone] 06/26/2018, 10:11 AM 

## 2018-06-26 NOTE — Plan of Care (Signed)
  Problem: Education: Goal: Knowledge of General Education information will improve Description Including pain rating scale, medication(s)/side effects and non-pharmacologic comfort measures Outcome: Progressing   Problem: Health Behavior/Discharge Planning: Goal: Ability to manage health-related needs will improve Outcome: Progressing   

## 2018-06-26 NOTE — Progress Notes (Addendum)
Advanced Heart Failure Rounding Note  PCP-Cardiologist: No primary care provider on file.   Subjective:    Tmax 98.6 on ABX. CXR 06/24/2018 with "COPD without acute abnormality"  Feeling about the same today. Appetite remains low. Can't eat more than once or twice a day. Mild lightheadedness walking halls yesterday.   Cr 1.4 -> 1.7  R/LHC 06/24/2018 RA = 1 RV = 19/3 PA = 24/5 (14) PCW = 7 Fick cardiac output/index = 4.0/2.5 PVR = 1.9 WU Ao sat = 95% PA sat = 68%, 71%  Assessment: 1. Severe 1-vessel CAD with occluded LAD within previous stent 2. Normal RCA and LCX 3. LIMA to LAD widely patent 4. SVG - D2 widely patent 5. SVG - D1 likely occluded (grafts not marked)  Objective:   Weight Range: 52 kg Body mass index is 18.51 kg/m.   Vital Signs:   Temp:  [98.1 F (36.7 C)-98.6 F (37 C)] 98.1 F (36.7 C) (02/06 0509) Pulse Rate:  [96-99] 99 (02/06 0509) Resp:  [18-20] 18 (02/06 0509) BP: (100-106)/(58-66) 100/66 (02/06 0509) SpO2:  [95 %-97 %] 97 % (02/06 0509) Weight:  [52 kg] 52 kg (02/06 0508) Last BM Date: 06/23/18  Weight change: Filed Weights   06/24/18 0520 06/25/18 0510 06/26/18 0508  Weight: 52.4 kg 52.3 kg 52 kg    Intake/Output:   Intake/Output Summary (Last 24 hours) at 06/26/2018 0853 Last data filed at 06/26/2018 0754 Gross per 24 hour  Intake 1543 ml  Output 350 ml  Net 1193 ml     Physical Exam    General: NAD. Thin HEENT: Poor dentition Neck: Supple. JVP 6-7 cm. Carotids 2+ bilat; no bruits. No thyromegaly or nodule noted. Cor: PMI nondisplaced. Regular, slightly tachy. ? S3 vs split S2.  Lungs: Clear anteriorly.  Abdomen: Soft, non-tender, non-distended, no HSM. No bruits or masses. +BS  Extremities: No cyanosis, clubbing, or rash. R and LLE no edema.  Neuro: Alert & orientedx3, cranial nerves grossly intact. moves all 4 extremities w/o difficulty. Affect pleasant    Telemetry   NSR/ST 90-100s with LBBB, personally reviewed.     EKG    No new tracings.    Labs    CBC Recent Labs    06/24/18 0518  06/24/18 1521 06/25/18 0907  WBC 7.2  --   --  10.9*  NEUTROABS  --   --   --  8.8*  HGB 14.8   < > 15.0  14.6 14.2  HCT 46.6   < > 44.0  43.0 45.0  MCV 88.1  --   --  87.4  PLT 180  --   --  164   < > = values in this interval not displayed.   Basic Metabolic Panel Recent Labs    17/00/17 0644 06/26/18 0458  NA 133* 135  K 3.7 3.6  CL 101 104  CO2 18* 18*  GLUCOSE 100* 110*  BUN 38* 51*  CREATININE 1.43* 1.74*  CALCIUM 8.4* 8.6*  MG 2.3 2.5*   Liver Function Tests No results for input(s): AST, ALT, ALKPHOS, BILITOT, PROT, ALBUMIN in the last 72 hours. No results for input(s): LIPASE, AMYLASE in the last 72 hours. Cardiac Enzymes No results for input(s): CKTOTAL, CKMB, CKMBINDEX, TROPONINI in the last 72 hours.  BNP: BNP (last 3 results) Recent Labs    06/22/18 1355  BNP 4,095.8*    ProBNP (last 3 results) No results for input(s): PROBNP in the last 8760 hours.   D-Dimer  No results for input(s): DDIMER in the last 72 hours. Hemoglobin A1C No results for input(s): HGBA1C in the last 72 hours. Fasting Lipid Panel Recent Labs    06/25/18 0644  CHOL 110  HDL 31*  LDLCALC 61  TRIG 91  CHOLHDL 3.5   Thyroid Function Tests No results for input(s): TSH, T4TOTAL, T3FREE, THYROIDAB in the last 72 hours.  Invalid input(s): FREET3  Other results:   Imaging    No results found.   Medications:     Scheduled Medications: . aspirin EC  81 mg Oral Daily  . atorvastatin  40 mg Oral q1800  . azithromycin  250 mg Oral Daily  . enoxaparin (LOVENOX) injection  40 mg Subcutaneous Q24H  . Influenza vac split quadrivalent PF  0.5 mL Intramuscular Tomorrow-1000  . losartan  12.5 mg Oral Daily  . sodium chloride flush  3 mL Intravenous Q12H  . spironolactone  12.5 mg Oral Daily    Infusions: . sodium chloride    . cefTRIAXone (ROCEPHIN)  IV 1 g (06/25/18 1005)    PRN  Medications: sodium chloride, acetaminophen, albuterol, ondansetron (ZOFRAN) IV, sodium chloride flush    Patient Profile   Jeremy PomfretJohn L Powers is a 63 y.o. male with a history of CAD with NSTEMI, s/p PCI to LAD in 2015 and 2016, s/p CABG 2017, microcytic/iron deficiency anemia followed by hematology and GI, LBBB, and tobacco use.  [facility] and cough on 2/2. [facility].   Assessment/Plan   1. Acute systolic HF - Newly reduced EF to 25-30%. Prior echo 12/2017: EF 55-60%. Normal nuclear study in 12/2017. No cath since prior to his CABG - Volume status looks OK on exam.  - Not on diuretics.  - Hold BB for now with mildly reduced cardiaac output. Will aim to start low dose prior to discharge.  - Cr bumped to 1.7 from 1.4. BP 100-110s. Not on diuretics.  - Move losartan 12.5 mg to bedtime and follow pressure throughout day.  - Move spiro 12.5 mg to bedtime and follow pressure.  - Consider bidil, but I doubt he will be compliant with TID medication. If renal function worsens, may still be best option for him. No BP room currently.  - Would benefit from paramedicineif friend agrees to let them come in house.   2. CAD s/p CABG 2017 - CAD and revascularization stable on [facility] 06/24/2018 - Troponin 0.38 -> 0.49 - Has chronic exertional CP, no change in frequency or severity - Continue ASA. Start statin.  - Lipids: Chol 110, TG 91, HDL 31, LDL 61.  - BB on hold. He is on lopressor 25 mg BID at home. Will switch to Toprol XL if output okay.  3. LBBB - Chronic, at least since 2017 - If EF remains low, would qualify for CRT-D if EF remains low with GDMT. No change.   4. Tobacco use - Encouraged cessation.   5. Microcytic/iron deficiency anemia  - Admitted 03/2018 with hemoglobin 4.9. Colonoscopy and EGD negative. Found to have severe iron deficiency - Followed by hematology and GI as outpatient. Planning for capsule study as outpatient - Hemoglobin  stable 10.9.   6. AKI - Cr 1.43 -> 1.74 today. Continue to follow.   7. Fever - Had fever of 102.9 06/24/2018. Started on Rocephin for CAP coverage.  - Afebrile. No CBC today.   Medication concerns reviewed with patient and pharmacy team. Barriers identified: Compliance, transportation, affording medications.  Length of Stay: 2  Luane School  06/26/2018, 8:53 AM  Advanced Heart Failure Team Pager [phone] (M-F; 7a - 4p)  Please contact [facility] for night-coverage after hours (4p -7a ) and weekends on [email or web address]  Patient seen and examined with the above-signed Advanced Practice Provider and/or Housestaff. I personally reviewed laboratory data, imaging studies and relevant notes. I independently examined the patient and formulated the important aspects of the plan. I have edited the note to reflect any of my changes or salient points. I have personally discussed the plan with the patient and/or family.  Remains weak. BP soft. Creatinine up. Will give IVF. Remains on abx for CAP. No angina. Will continue HF meds. Eventually will need CRT-D.   Possibly home in am.   Arvilla Meres, MD  5:01 PM

## 2018-06-26 NOTE — Care Management Note (Addendum)
Case Management Note  Patient Details  Name: Jeremy Powers MRN: [id number] Date of Birth: Dec 14, 1955  Subjective/Objective:    CHF               Action/Plan: Patient lives at home with friend that assist him as needed; PCP is Dr Kristine Linea; has private insurance with Medicaid; he does not know his pharmacy of choice; Patient states that he privately pays his friend to assist him with errands and transportation; CM talked to patient about Medicaid transportation assistance; patient stated that he did not like their service and does not want to use them; He states that he loves salt; CM talked to patient about the importance of limiting salt in his diet. Dietitian consult place for ongoing education; Patient could greatly benefit from Jeremy Powers services; pt chose [facility]; Jeremy Powers with [facility] called for arrangement. Medicaid patient usually have benefits for  personal care service ( a nurses aid that will assist the patient in the home as needed, light cooking, sometimes errands at no cost to the patient). CM working on getting patient extra services in the home; will continue to follow for progression of care.  10:57 am- CM talked to patient again; He called his friend Jeremy Powers; PCP [facility]; Pharmacy of choice is [facility] on [address];  CM informed her that he has benefits that he is not using; patient is agreeable to have transportation assistance; Jeremy Powers made aware; CM reaching out to PCP; Jeremy Jeremy Filter Powers,Jeremy Powers,Jeremy Powers  12:36 pm- Follow up hosp apt made with Jeremy Peers NP at [facility] for Jul 02, 2018 at 2 pm; CM talked to patient at length about the importance of keeping his apts. MD office stated that they have not seen him since 2015 and has missed several apt- recent apt 05/08/2018 he was a no show. Patient stated that he did not have transportation. CM asked him about his friend that was supposed to take him to his apt. He stated " he had no gas money."  Lots of encouragement given; Powers to talk to patient about arrangements for transportation. Jeremy Powers,Jeremy Powers,Jeremy Powers  1:21 pm- Paperwork for a personal care service sent to the PCP office for completion at the time of his apt. Jeremy Jeremy Powers,Jeremy Powers,Jeremy Powers  2:55 pm - CM talked to Jeremy Powers with [facility]; a [facility] has been added to assist patient tap into his Medicaid benefits. Of note, patient told Jeremy Powers that he was homeless but he is staying with his girlfriend Jeremy Powers. CM will continue to follow for progression of care. Jeremy Derrick Powers  Expected Discharge Date:   possibly 07/01/2018               Expected Discharge Plan:  Home/Self Care  In-House Referral:   Dietitian   Discharge planning Services  CM Consult  Choice offered to:  Patient  HH Arranged:  Powers, Disease Management, PT HH Agency:  [facility]  Status of Service:  In process, will continue to follow  Reola Mosher 06/26/2018, 10:28 AM

## 2018-06-27 ENCOUNTER — Inpatient Hospital Stay (HOSPITAL_COMMUNITY): Payer: Medicaid Other

## 2018-06-27 LAB — BASIC METABOLIC PANEL
Anion gap: 13 (ref 5–15)
BUN: 45 mg/dL — AB (ref 8–23)
CO2: 19 mmol/L — ABNORMAL LOW (ref 22–32)
Calcium: 8.5 mg/dL — ABNORMAL LOW (ref 8.9–10.3)
Chloride: 105 mmol/L (ref 98–111)
Creatinine, Ser: 1.2 mg/dL (ref 0.61–1.24)
GFR calc Af Amer: >60 mL/min (ref >60)
GFR calc non Af Amer: >60 mL/min (ref >60)
Glucose, Bld: 93 mg/dL (ref 70–99)
Potassium: 3.5 mmol/L (ref 3.5–5.1)
Sodium: 137 mmol/L (ref 135–145)

## 2018-06-27 LAB — MAGNESIUM: Magnesium: 2.5 mg/dL — ABNORMAL HIGH (ref 1.7–2.4)

## 2018-06-27 MED ORDER — AZITHROMYCIN 250 MG PO TABS: 250.0000 mg | ORAL_TABLET | Freq: Every day | ORAL | 0 refills | Status: AC

## 2018-06-27 MED ORDER — ATORVASTATIN CALCIUM 40 MG PO TABS: 40.0000 mg | ORAL_TABLET | Freq: Every day | ORAL | 5 refills | Status: DC

## 2018-06-27 MED ORDER — POTASSIUM CHLORIDE CRYS ER 20 MEQ PO TBCR
40.0000 meq | EXTENDED_RELEASE_TABLET | Freq: Once | ORAL | Status: AC
  Administered 2018-06-27: 40 meq via ORAL
  Filled 2018-06-27: qty 2

## 2018-06-27 MED ORDER — LOSARTAN POTASSIUM 25 MG PO TABS: 25.0000 mg | ORAL_TABLET | Freq: Every day | ORAL | Status: DC

## 2018-06-27 MED ORDER — SPIRONOLACTONE 25 MG PO TABS: 12.5000 mg | ORAL_TABLET | Freq: Every day | ORAL | 5 refills | Status: DC

## 2018-06-27 MED ORDER — METOPROLOL SUCCINATE ER 25 MG PO TB24: 12.5000 mg | ORAL_TABLET | Freq: Every day | ORAL | Status: DC

## 2018-06-27 MED ORDER — LOSARTAN POTASSIUM 25 MG PO TABS: 25.0000 mg | ORAL_TABLET | Freq: Every day | ORAL | 5 refills | Status: DC

## 2018-06-27 MED ORDER — GADOBUTROL 1 MMOL/ML IV SOLN
7.0000 mL | Freq: Once | INTRAVENOUS | Status: AC | PRN
  Administered 2018-06-27: 7 mL via INTRAVENOUS

## 2018-06-27 MED FILL — LOSARTAN POTASSIUM 25 MG TA: 25 | 30 days supply | Qty: 30 | Fill #0 | Status: TO

## 2018-06-27 MED FILL — AZITHROMYCIN 250 MG TABLET: 250 | 4 days supply | Qty: 4 | Fill #0

## 2018-06-27 MED FILL — SPIRONOLACTONE 25 MG TABLET: 25 | 30 days supply | Qty: 15 | Fill #0 | Status: TO

## 2018-06-27 MED FILL — ATORVASTATIN CALCIUM 40 MG: 40 | 30 days supply | Qty: 30 | Fill #0 | Status: TO

## 2018-06-27 NOTE — Progress Notes (Addendum)
Advanced Heart Failure Rounding Note  PCP-Cardiologist: No primary care provider on file.   Subjective:    Tmax 98.6 on ABX. CXR 06/24/2018 with "COPD without acute abnormality"  Feeling OK today. Ready to go home. Appetite slowly improving. Wants a dose. Lightheadedness improved.   Cr 1.4 -> 1.7 -> 1.2.  R/LHC 06/24/2018 RA = 1 RV = 19/3 PA = 24/5 (14) PCW = 7 Fick cardiac output/index = 4.0/2.5 PVR = 1.9 WU Ao sat = 95% PA sat = 68%, 71%  Assessment: 1. Severe 1-vessel CAD with occluded LAD within previous stent 2. Normal RCA and LCX 3. LIMA to LAD widely patent 4. SVG - D2 widely patent 5. SVG - D1 likely occluded (grafts not marked)  Objective:   Weight Range: 52.5 kg Body mass index is 18.69 kg/m.   Vital Signs:   Temp:  [97.2 F (36.2 C)-97.9 F (36.6 C)] 97.9 F (36.6 C) (02/07 0528) Pulse Rate:  [73-83] 76 (02/07 0528) Resp:  [18] 18 (02/07 0528) BP: (117-123)/(53-71) 123/70 (02/07 0528) SpO2:  [96 %-98 %] 96 % (02/07 0528) Weight:  [52.5 kg] 52.5 kg (02/07 0528) Last BM Date: 06/23/18  Weight change: Filed Weights   06/25/18 0510 06/26/18 0508 06/27/18 0528  Weight: 52.3 kg 52 kg 52.5 kg    Intake/Output:   Intake/Output Summary (Last 24 hours) at 06/27/2018 0822 Last data filed at 06/26/2018 1926 Gross per 24 hour  Intake 653.21 ml  Output 150 ml  Net 503.21 ml     Physical Exam    General: NAD. Thin.  HEENT: Poor dentition.  Neck: Supple. JVP 6-7 cm. Carotids 2+ bilat; no bruits. No thyromegaly or nodule noted. Cor: PMI nondisplaced. Regular. Split S2.  Lungs: CTAB, normal effort. Abdomen: Soft, non-tender, non-distended, no HSM. No bruits or masses. +BS  Extremities: No cyanosis, clubbing, or rash. R and LLE no edema.  Neuro: Alert & orientedx3, cranial nerves grossly intact. moves all 4 extremities w/o difficulty. Affect pleasant    Telemetry   NSR 70s with LBBB, personally reviewed.   EKG    No new tracings.    Labs      CBC Recent Labs    06/24/18 1521 06/25/18 0907  WBC  --  10.9*  NEUTROABS  --  8.8*  HGB 15.0  14.6 14.2  HCT 44.0  43.0 45.0  MCV  --  87.4  PLT  --  164   Basic Metabolic Panel Recent Labs    24/46/95 0458 06/27/18 0453  NA 135 137  K 3.6 3.5  CL 104 105  CO2 18* 19*  GLUCOSE 110* 93  BUN 51* 45*  CREATININE 1.74* 1.20  CALCIUM 8.6* 8.5*  MG 2.5* 2.5*   Liver Function Tests No results for input(s): AST, ALT, ALKPHOS, BILITOT, PROT, ALBUMIN in the last 72 hours. No results for input(s): LIPASE, AMYLASE in the last 72 hours. Cardiac Enzymes No results for input(s): CKTOTAL, CKMB, CKMBINDEX, TROPONINI in the last 72 hours.  BNP: BNP (last 3 results) Recent Labs    06/22/18 1355  BNP 4,095.8*    ProBNP (last 3 results) No results for input(s): PROBNP in the last 8760 hours.   D-Dimer No results for input(s): DDIMER in the last 72 hours. Hemoglobin A1C No results for input(s): HGBA1C in the last 72 hours. Fasting Lipid Panel Recent Labs    06/25/18 0644  CHOL 110  HDL 31*  LDLCALC 61  TRIG 91  CHOLHDL 3.5   Thyroid  Function Tests No results for input(s): TSH, T4TOTAL, T3FREE, THYROIDAB in the last 72 hours.  Invalid input(s): FREET3  Other results:   Imaging    No results found.   Medications:     Scheduled Medications: . aspirin EC  81 mg Oral Daily  . atorvastatin  40 mg Oral q1800  . azithromycin  250 mg Oral Daily  . enoxaparin (LOVENOX) injection  40 mg Subcutaneous Q24H  . losartan  12.5 mg Oral QHS  . sodium chloride flush  3 mL Intravenous Q12H  . spironolactone  12.5 mg Oral QHS    Infusions: . sodium chloride Stopped (06/26/18 1134)  . cefTRIAXone (ROCEPHIN)  IV Stopped (06/26/18 1017)    PRN Medications: sodium chloride, acetaminophen, albuterol, ondansetron (ZOFRAN) IV, sodium chloride flush    Patient Profile   Jeremy Powers is a 63 y.o. male with a history of CAD with NSTEMI, s/p PCI to LAD in 2015 and  2016, s/p CABG 2017, microcytic/iron deficiency anemia followed by hematology and GI, LBBB, and tobacco use.  [facility] with [facility]B and cough on 2[facility]2. [facility]   Assessment/Plan   1. Acute systolic HF - Newly reduced EF to 25-30%. Prior echo 12/2017: EF 55-60%. Normal nuclear study in 12/2017. No cath since prior to his CABG - Volume status looks OK on exam.  - Not on diuretics.  - Hold off on BB for now.  - Cr back down to 1.2 with gentle IVF.  - Increase losartan to 25 mg qhs.  - Continue spiro 12.5 qhs.  - No room for Bidil currently.  - Would benefit from paramedicineif friend agrees to let them come in house. [facility]   2. CAD s/p CABG 2017 - CAD and revascularization stable on [facility] 06/24/2018 - Troponin 0.38 -> 0.49 - Has chronic exertional CP, no change in frequency or severity - Continue ASA. Start statin.  - Lipids: Chol 110, TG 91, HDL 31, LDL 61.   3. LBBB - Chronic, at least since 2017 - If EF remains low, would qualify for CRT-D if EF remains low with GDMT. Plan cMRI today prior to discharge.   4. Tobacco use - Encouraged cessation.    5. Microcytic/iron deficiency anemia  - Admitted 03/2018 with hemoglobin 4.9. Colonoscopy and EGD negative. Found to have severe iron deficiency - Followed by hematology and GI as outpatient. Planning for capsule study as outpatient - Hemoglobin stable. 14.2 06/24/2018. No overt bleeding.   6. AKI - Cr 1.43 -> 1.74 -> 1.2 with gentle IVF.   7. Fever - Had fever of 102.9 06/24/2018. Started on Rocephin for CAP coverage.  - Afebrile. Cough improving.   Likely home today s/p cMRI. TOC meds for discharge.   Medication concerns reviewed with patient and pharmacy team. Barriers identified: Compliance, transportation, affording medications.   Length of Stay: 3  Luane SchoolMichael Andrew Tillery, PA-C  06/27/2018, 8:22 AM  Advanced Heart Failure Team Pager [phone] (M-F; 7a -  4p)  Please contact [facility] for night-coverage after hours (4p -7a ) and weekends on [email or web address]  Patient seen and examined with the above-signed Advanced Practice Provider and/or Housestaff. I personally reviewed laboratory data, imaging studies and relevant notes. I independently examined the patient and formulated the important aspects of the plan. I have edited the note to reflect any of my changes or salient points. I have personally discussed the plan with the patient and/or family.  He is much improved. Volume status and creatinine are better with fluids. Has cMRI today. OK for d/c after MRI with close f/u.   Arvilla Meres, MD  2:37 PM

## 2018-06-27 NOTE — Discharge Summary (Addendum)
Advanced Heart Failure Discharge Note  Discharge Summary   Patient ID: Jeremy Powers MRN: [id number], DOB/AGE: 1955/10/22 63 y.o. Admit date: 06/22/2018 D/C date:     06/27/2018   Primary Discharge Diagnoses:  1. Acute systolic HF 2. CAD s/p CABG 2017 3. LBBB 4. Tobacco use 5. Microcytic/iron deficiency anemia  6. AKI 7. Bronchitis  Hospital Course:   Jeremy Powers is a 63 y.o. male with a history of CAD with NSTEMI, s/p PCI to LAD in 2015 and 2016, s/p CABG 2017, microcytic/iron deficiency anemia followed by hematology and GI, LBBB, and tobacco use.  He presented to [facility] ED with SOB, cough, and orthopnea in setting of med non-compliance. Was unable to afford copays with Medicaid. He was tachycardic and tachypneic on arrival, and was transferred to [facility] for further evaluation. Echo performed which showed newly decreased EF of 25-30% from 50-55% in 12/2017.  Planned for LHC, but deferred due to AKI. CHF asked to see to optimize and follow.  Taken for [facility] which showed severe 1 v disease with occluded LAD within previous stent. Other vessels stable and grafts patent as below. Cath also showed low filling pressures with moderately low cardiac output.   Meds adjusted as tolerated. Given gentle IVF hydration after cath.  Sent for cMRI am of 06/27/2018 which was pending at time of discharge.   Examined 06/27/2018 and determined stable for discharge with close follow up as below. Recommended for paramedicine. Meds sent to [facility] pharmacy, which will cover cost of his initial copays.   Discharge Weight Range: 115 lbs Discharge Vitals: Blood pressure 123/70, pulse 76, temperature 97.9 F (36.6 C), temperature source Oral, resp. rate 18, height 5\' 6"  (1.676 m), weight 52.5 kg, SpO2 96 %.  Labs: Lab Results  Component Value Date   WBC 10.9 (H) 06/25/2018   HGB 14.2 06/25/2018   HCT 45.0 06/25/2018   MCV 87.4 06/25/2018   PLT 164 06/25/2018    Recent Labs  Lab 06/22/18 1355  06/27/18 0453  NA 136    < > 137  K 3.1*   < > 3.5  CL 104   < > 105  CO2 18*   < > 19*  BUN 16   < > 45*  CREATININE 1.05   < > 1.20  CALCIUM 8.8*   < > 8.5*  PROT 8.9*  --   --   BILITOT 0.7  --   --   ALKPHOS 70  --   --   ALT 15  --   --   AST 43*  --   --   GLUCOSE 131*   < > 93   < > = values in this interval not displayed.   Lab Results  Component Value Date   CHOL 110 06/25/2018   HDL 31 (L) 06/25/2018   LDLCALC 61 06/25/2018   TRIG 91 06/25/2018   BNP (last 3 results) Recent Labs    06/22/18 1355  BNP 4,095.8*    ProBNP (last 3 results) No results for input(s): PROBNP in the last 8760 hours.   Diagnostic Studies/Procedures   [facility] 06/24/2018 RA = 1 RV = 19/3 PA = 24/5 (14) PCW = 7 Fick cardiac output/index = 4.0/2.5 PVR = 1.9 WU Ao sat = 95% PA sat = 68%, 71%  Assessment: 1. Severe 1-vessel CAD with occluded LAD within previous stent 2. Normal RCA and LCX 3. LIMA to LAD widely patent 4. SVG - D2 widely patent 5. SVG - D1  likely occluded (grafts not marked)  Echo 06/23/18: EF 25-30%, several septal-lateral dyssynchrony, RV normal, mildly dilated LA, mild MR, mod TR, pontaneous echo contrast consistent with low cardiac output/slow flow, but there is no left ventricular thrombus  Discharge Medications   Allergies as of 06/27/2018   No Known Allergies     Medication List    STOP taking these medications   lisinopril 10 MG tablet Commonly known as:  PRINIVIL,ZESTRIL   metoprolol tartrate 25 MG tablet Commonly known as:  LOPRESSOR     TAKE these medications   aspirin EC 81 MG tablet Take by mouth.   atorvastatin 40 MG tablet Commonly known as:  LIPITOR Take 1 tablet (40 mg total) by mouth daily at 6 PM.   azithromycin 250 MG tablet Commonly known as:  ZITHROMAX Take 1 tablet (250 mg total) by mouth daily for 4 doses.   ferrous sulfate 325 (65 FE) MG tablet Take 325 mg by mouth 2 (two) times daily with a meal.   losartan 25 MG tablet Commonly known as:   COZAAR Take 1 tablet (25 mg total) by mouth at bedtime.   spironolactone 25 MG tablet Commonly known as:  ALDACTONE Take 0.5 tablets (12.5 mg total) by mouth at bedtime.       Disposition   The patient will be discharged in stable condition to home. Discharge Instructions    Diet - low sodium heart healthy   Complete by:  As directed    Increase activity slowly   Complete by:  As directed      Follow-up Information    [facility] - Dme Follow up.   Why:  They will do your home health care at your home Contact information: [address] [place] [address] [phone]        [facility] Follow up on 07/02/2018.   Specialty:  Pediatrics Why:  at 2 pm; please try to keep your apt or call to reschedule Contact information: [address] [place] [address] [phone]        [facility]. Go on 07/11/2018.   Specialty:  Cardiology Why:  at 0930 in the Advanced Heart Failure Clinic.  Please bring all medications to appt. Gate code is [phone] for February. Contact information: [address] [id number] [place] [address] [phone]            Duration of Discharge Encounter: Greater than 35 minutes   Signed, Luane School 06/27/2018, 1:23 PM   Patient seen and examined with the above-signed Advanced Practice Provider and/or Housestaff. I personally reviewed laboratory data, imaging studies and relevant notes. I independently examined the patient and formulated the important aspects of the plan. I have edited the note to reflect any of my changes or salient points. I have personally discussed the plan with the patient and/or family.  He is much improved. Volume status and creatinine are better with fluids. Has cMRI today. OK for d/c after MRI with close f/u. Consider CRT-D in near future.   Arvilla Meres, MD  2:37 PM

## 2018-06-27 NOTE — Progress Notes (Signed)
Patient ready for discharge to home . All discharge instructions reviewed with patient. Follow up appts emphasized.  [facility] outpatient pharmacy providing meds for discharge.  No c/d discomfort, no s/sx of distress noted.

## 2018-06-29 LAB — CULTURE, BLOOD (ROUTINE X 2)
Culture: NO GROWTH
Culture: NO GROWTH
SPECIAL REQUESTS: ADEQUATE
Special Requests: ADEQUATE

## 2018-06-30 DIAGNOSIS — I25810 Atherosclerosis of coronary artery bypass graft(s) without angina pectoris: Secondary | ICD-10-CM | POA: Diagnosis not present

## 2018-06-30 DIAGNOSIS — I214 Non-ST elevation (NSTEMI) myocardial infarction: Secondary | ICD-10-CM

## 2018-07-02 ENCOUNTER — Telehealth (HOSPITAL_COMMUNITY): Payer: Self-pay

## 2018-07-03 NOTE — Telephone Encounter (Signed)
I called Jeremy Powers to schedule an appointment. His daughter answered and stated that he would not be able to meet until next week due to several doctors appointments. We spoke about which day would be best for him and she suggested Tuesday but advised she would call me later to confirm a time.

## 2018-07-09 ENCOUNTER — Telehealth (HOSPITAL_COMMUNITY): Payer: Self-pay

## 2018-07-09 NOTE — Telephone Encounter (Signed)
I unwittingly called Ms Cleophas Dunker while attempting to call Mr Rosene. Ms Cleophas Dunker stated Mr Bigbie was not home but she would contact him and have him call me back from his phone number.

## 2018-07-11 ENCOUNTER — Inpatient Hospital Stay (HOSPITAL_COMMUNITY): Payer: Medicaid Other

## 2018-07-15 ENCOUNTER — Telehealth (HOSPITAL_COMMUNITY): Payer: Self-pay

## 2018-07-15 NOTE — Telephone Encounter (Signed)
I called Jeremy Powers to schedule an appointment. He was unavailable but the woman who answered the phone stated that he would be able to meet with me tomorrow at 12:00. This will be my first meeting with Jeremy Powers.

## 2018-07-16 ENCOUNTER — Other Ambulatory Visit (HOSPITAL_COMMUNITY): Payer: Self-pay

## 2018-07-16 NOTE — Progress Notes (Signed)
Paramedicine Encounter    Patient ID: Jeremy Powers, male    DOB: 1955/06/11, 63 y.o.   MRN: [id number]   Patient Care Team: [facility] as PCP - General (Pediatrics)  Patient Active Problem List   Diagnosis Date Noted  . Acute systolic (congestive) heart failure (HCC) 06/24/2018  . Aortic insufficiency 06/22/2018  . Acute on chronic congestive heart failure (HCC) 06/22/2018  . Elevated troponin 06/22/2018  . CAD (coronary artery disease) 06/22/2018  . Left bundle branch block 06/22/2018  . History of iron deficiency anemia 06/22/2018  . Acute on chronic heart failure (HCC) 06/22/2018    Current Outpatient Medications:  .  aspirin EC 81 MG tablet, Take by mouth., Disp: , Rfl:  .  atorvastatin (LIPITOR) 40 MG tablet, Take 1 tablet (40 mg total) by mouth daily at 6 PM., Disp: 30 tablet, Rfl: 5 .  ferrous sulfate 325 (65 FE) MG tablet, Take 325 mg by mouth 2 (two) times daily with a meal., Disp: , Rfl:  .  losartan (COZAAR) 25 MG tablet, Take 1 tablet (25 mg total) by mouth at bedtime., Disp: 30 tablet, Rfl: 5 .  spironolactone (ALDACTONE) 25 MG tablet, Take 0.5 tablets (12.5 mg total) by mouth at bedtime. (Patient not taking: Reported on 07/16/2018), Disp: 15 tablet, Rfl: 5 No Known Allergies    Social History   Socioeconomic History  . Marital status: Single    Spouse name: Not on file  . Number of children: Not on file  . Years of education: Not on file  . Highest education level: Not on file  Occupational History  . Not on file  Social Needs  . Financial resource strain: Not on file  . Food insecurity:    Worry: Not on file    Inability: Not on file  . Transportation needs:    Medical: Not on file    Non-medical: Not on file  Tobacco Use  . Smoking status: Current Every Day Smoker    Packs/day: 0.50    Types: Cigarettes  . Smokeless tobacco: Never Used  Substance and Sexual Activity  . Alcohol use: Yes    Alcohol/week: 2.0 standard  drinks    Types: 2 Cans of beer per week    Comment: 2 40oz beer daily  . Drug use: Yes    Types: Marijuana  . Sexual activity: Not on file  Lifestyle  . Physical activity:    Days per week: Not on file    Minutes per session: Not on file  . Stress: Not on file  Relationships  . Social connections:    Talks on phone: Not on file    Gets together: Not on file    Attends religious service: Not on file    Active member of club or organization: Not on file    Attends meetings of clubs or organizations: Not on file    Relationship status: Not on file  . Intimate partner violence:    Fear of current or ex partner: Not on file    Emotionally abused: Not on file    Physically abused: Not on file    Forced sexual activity: Not on file  Other Topics Concern  . Not on file  Social History Narrative  . Not on file    Physical Exam Cardiovascular:     Rate and Rhythm: Normal rate and regular rhythm.     Pulses: Normal pulses.  Pulmonary:     Effort: Pulmonary effort  is normal.     Breath sounds: Normal breath sounds.  Abdominal:     Palpations: Abdomen is soft.  Musculoskeletal: Normal range of motion.     Right lower leg: No edema.     Left lower leg: No edema.  Skin:    General: Skin is warm and dry.     Capillary Refill: Capillary refill takes less than 2 seconds.  Neurological:     Mental Status: He is alert and oriented to person, place, and time.  Psychiatric:        Mood and Affect: Mood normal.         No future appointments.  BP (!) 150/68 (BP Location: Left Arm, Patient Position: Sitting, Cuff Size: Normal)   Pulse 72   Resp 18   Wt 128 lb 12.8 oz (58.4 kg)   SpO2 98%   BMI 20.79 kg/m    Weight yesterday- 129.6 lb Last visit weight- N/A  [name] was seen at home today for our initial visit and he reported feeling well. He denied any chest pian, SOB, headache, dizziness or orthopnea. His initial question for me was about if he was able to have sex  following the diagnosis of his heart failure. I advised that I did not see an immediate problem with it but I could confirm this with the clinic provider. He currently has his medications with the exception of spironolactone because he did not know he was supposed to be taking a half a pill daily and had been taking a whole pill. I will look into getting him enough to cover him until his insurance is willing to pay for a refill. I spent a significant amount of time educating him on his disease process, medications, diet and fluid restrictions. He seemed receptive but I think it is going to take him some time to become independent with this. He expressed some moderate food insecurity due to financial hardship. He currently gets a disability check but says there is not much left over after he pays his bills, helps his girlfriend pay her bills and then buys his medications. I will look into resources near him to take next time I am there. [facility] is currently following him as well and had left their number to call in the event of significant weight gain either over night or a week. I advised that he also let me know if this happens so I can communicate with the clinic providers. He expressed understanding and was agreeable. His medications were verified and he was provided with a pillbox which was filled for him. I will follow up in the coming days with a solution to his spironolactone shortage.   Jacqualine Code, EMT 07/16/18  ACTION: Home visit completed Next visit planned for 1 week

## 2018-07-22 ENCOUNTER — Telehealth (HOSPITAL_COMMUNITY): Payer: Self-pay

## 2018-07-22 ENCOUNTER — Other Ambulatory Visit (HOSPITAL_COMMUNITY): Payer: Self-pay

## 2018-07-22 MED ORDER — SPIRONOLACTONE 25 MG PO TABS: 12.5000 mg | ORAL_TABLET | Freq: Every day | ORAL | 5 refills | Status: DC

## 2018-07-22 MED FILL — SPIRONOLACTONE 25 MG TABLET: 25 | 30 days supply | Qty: 15 | Fill #0

## 2018-07-22 NOTE — Telephone Encounter (Signed)
I called Mr Jeremy Powers to schedule an appointment. He did not answer and no voicemail was left. I will reach out again later today or tomorrow.

## 2018-07-23 ENCOUNTER — Other Ambulatory Visit (HOSPITAL_COMMUNITY): Payer: Self-pay

## 2018-07-23 NOTE — Progress Notes (Signed)
I stopped in at Jeremy Powers' home to drop off off spironolactone which I had picked up from the pharmacy. His family stated he was out getting a hair cut and weren't sure when he would be home. I advised them that he should take a half tablet per day of the spironolactone and to let him know. The were understanding and agreeable. I will reach out again on Friday for a follow up appointment.

## 2018-07-30 ENCOUNTER — Other Ambulatory Visit (HOSPITAL_COMMUNITY): Payer: Self-pay

## 2018-07-30 NOTE — Progress Notes (Signed)
Paramedicine Encounter    Patient ID: Jeremy Powers, male    DOB: May 17, 1956, 63 y.o.   MRN: [id number]   Patient Care Team: [facility] as PCP - General (Pediatrics)  Patient Active Problem List   Diagnosis Date Noted  . Acute systolic (congestive) heart failure (HCC) 06/24/2018  . Aortic insufficiency 06/22/2018  . Acute on chronic congestive heart failure (HCC) 06/22/2018  . Elevated troponin 06/22/2018  . CAD (coronary artery disease) 06/22/2018  . Left bundle branch block 06/22/2018  . History of iron deficiency anemia 06/22/2018  . Acute on chronic heart failure (HCC) 06/22/2018    Current Outpatient Medications:  .  aspirin EC 81 MG tablet, Take by mouth., Disp: , Rfl:  .  atorvastatin (LIPITOR) 40 MG tablet, Take 1 tablet (40 mg total) by mouth daily at 6 PM., Disp: 30 tablet, Rfl: 5 .  ferrous sulfate 325 (65 FE) MG tablet, Take 325 mg by mouth 2 (two) times daily with a meal., Disp: , Rfl:  .  losartan (COZAAR) 25 MG tablet, Take 1 tablet (25 mg total) by mouth at bedtime., Disp: 30 tablet, Rfl: 5 .  spironolactone (ALDACTONE) 25 MG tablet, Take 0.5 tablets (12.5 mg total) by mouth at bedtime., Disp: 15 tablet, Rfl: 5 No Known Allergies    Social History   Socioeconomic History  . Marital status: Single    Spouse name: Not on file  . Number of children: Not on file  . Years of education: Not on file  . Highest education level: Not on file  Occupational History  . Not on file  Social Needs  . Financial resource strain: Not on file  . Food insecurity:    Worry: Not on file    Inability: Not on file  . Transportation needs:    Medical: Not on file    Non-medical: Not on file  Tobacco Use  . Smoking status: Current Every Day Smoker    Packs/day: 0.50    Types: Cigarettes  . Smokeless tobacco: Never Used  Substance and Sexual Activity  . Alcohol use: Yes    Alcohol/week: 2.0 standard drinks    Types: 2 Cans of beer per week   Comment: 2 40oz beer daily  . Drug use: Yes    Types: Marijuana  . Sexual activity: Not on file  Lifestyle  . Physical activity:    Days per week: Not on file    Minutes per session: Not on file  . Stress: Not on file  Relationships  . Social connections:    Talks on phone: Not on file    Gets together: Not on file    Attends religious service: Not on file    Active member of club or organization: Not on file    Attends meetings of clubs or organizations: Not on file    Relationship status: Not on file  . Intimate partner violence:    Fear of current or ex partner: Not on file    Emotionally abused: Not on file    Physically abused: Not on file    Forced sexual activity: Not on file  Other Topics Concern  . Not on file  Social History Narrative  . Not on file    Physical Exam Cardiovascular:     Rate and Rhythm: Normal rate and regular rhythm.     Pulses: Normal pulses.  Pulmonary:     Effort: Pulmonary effort is normal.     Breath sounds:  Examination of the right-upper field reveals wheezing. Examination of the right-lower field reveals wheezing. Wheezing present.  Abdominal:     General: There is no distension.     Palpations: Abdomen is soft.  Musculoskeletal: Normal range of motion.     Right lower leg: No edema.     Left lower leg: No edema.  Skin:    General: Skin is warm and dry.     Capillary Refill: Capillary refill takes less than 2 seconds.  Neurological:     Mental Status: He is alert and oriented to person, place, and time.  Psychiatric:        Mood and Affect: Mood normal.         No future appointments.  BP 120/70 (BP Location: Left Arm, Patient Position: Sitting, Cuff Size: Normal)   Pulse 66   Resp 16   Wt 137 lb (62.1 kg)   SpO2 96%   BMI 22.11 kg/m   Weight yesterday- Did not weigh Last visit weight- 128 lb   Mr Omana was seen at home today and reported feeling generally well. He denied chest pain,SOB, headache, dizziness or  orthopnea. He stated he has been compliant with his medications over the past week. His weight has increased but he did not show signs of fluid retention and his family said he has been eating more. His medications were verified and his pillbox was refilled. I ordered refills from his pharmacy of atorvastatin and losartan which will be ready for pick up tomorrow. He was made aware and said he would be able to get them. I advised him to call me if he has any issues, otherwise I will follow up next week.   Jacqualine Code, EMT 07/30/18  ACTION: Home visit completed Next visit planned for 1 week

## 2018-08-06 ENCOUNTER — Telehealth (HOSPITAL_COMMUNITY): Payer: Self-pay

## 2018-08-06 NOTE — Telephone Encounter (Signed)
I called Ms Jeremy Powers to see if [name] was available for a visit today. He phone went directly to voicemail so I left a message with my information and requested someone call me back.

## 2018-08-08 ENCOUNTER — Other Ambulatory Visit (HOSPITAL_COMMUNITY): Payer: Self-pay

## 2018-08-08 NOTE — Progress Notes (Signed)
Paramedicine Encounter    Patient ID: Jeremy Powers, male    DOB: 01-01-1956, 63 y.o.   MRN: [id number]   Patient Care Team: [facility] as PCP - General (Pediatrics)  Patient Active Problem List   Diagnosis Date Noted  . Acute systolic (congestive) heart failure (HCC) 06/24/2018  . Aortic insufficiency 06/22/2018  . Acute on chronic congestive heart failure (HCC) 06/22/2018  . Elevated troponin 06/22/2018  . CAD (coronary artery disease) 06/22/2018  . Left bundle branch block 06/22/2018  . History of iron deficiency anemia 06/22/2018  . Acute on chronic heart failure (HCC) 06/22/2018    Current Outpatient Medications:  .  aspirin EC 81 MG tablet, Take by mouth., Disp: , Rfl:  .  atorvastatin (LIPITOR) 40 MG tablet, Take 1 tablet (40 mg total) by mouth daily at 6 PM., Disp: 30 tablet, Rfl: 5 .  ferrous sulfate 325 (65 FE) MG tablet, Take 325 mg by mouth 2 (two) times daily with a meal., Disp: , Rfl:  .  losartan (COZAAR) 25 MG tablet, Take 1 tablet (25 mg total) by mouth at bedtime., Disp: 30 tablet, Rfl: 5 .  spironolactone (ALDACTONE) 25 MG tablet, Take 0.5 tablets (12.5 mg total) by mouth at bedtime., Disp: 15 tablet, Rfl: 5 No Known Allergies    Social History   Socioeconomic History  . Marital status: Single    Spouse name: Not on file  . Number of children: Not on file  . Years of education: Not on file  . Highest education level: Not on file  Occupational History  . Not on file  Social Needs  . Financial resource strain: Not on file  . Food insecurity:    Worry: Not on file    Inability: Not on file  . Transportation needs:    Medical: Not on file    Non-medical: Not on file  Tobacco Use  . Smoking status: Current Every Day Smoker    Packs/day: 0.50    Types: Cigarettes  . Smokeless tobacco: Never Used  Substance and Sexual Activity  . Alcohol use: Yes    Alcohol/week: 2.0 standard drinks    Types: 2 Cans of beer per week   Comment: 2 40oz beer daily  . Drug use: Yes    Types: Marijuana  . Sexual activity: Not on file  Lifestyle  . Physical activity:    Days per week: Not on file    Minutes per session: Not on file  . Stress: Not on file  Relationships  . Social connections:    Talks on phone: Not on file    Gets together: Not on file    Attends religious service: Not on file    Active member of club or organization: Not on file    Attends meetings of clubs or organizations: Not on file    Relationship status: Not on file  . Intimate partner violence:    Fear of current or ex partner: Not on file    Emotionally abused: Not on file    Physically abused: Not on file    Forced sexual activity: Not on file  Other Topics Concern  . Not on file  Social History Narrative  . Not on file    Physical Exam Cardiovascular:     Rate and Rhythm: Normal rate and regular rhythm.     Pulses: Normal pulses.  Pulmonary:     Effort: Pulmonary effort is normal.     Breath sounds:  Normal breath sounds.  Musculoskeletal: Normal range of motion.     Right lower leg: No edema.     Left lower leg: No edema.  Skin:    General: Skin is warm and dry.     Capillary Refill: Capillary refill takes less than 2 seconds.  Neurological:     Mental Status: He is alert and oriented to person, place, and time.  Psychiatric:        Mood and Affect: Mood normal.         No future appointments.  BP (!) 151/68 (BP Location: Right Arm, Patient Position: Sitting, Cuff Size: Normal)   Pulse 78   Resp 16   Wt 138 lb (62.6 kg)   SpO2 98%   BMI 22.27 kg/m   Weight yesterday- Did not weigh Last visit weight- 137 lb  Jeremy Powers was seen at home and reported feeling well. He denied chest pain, SOB, headache, dizziness or orthopnea. He stated he has been compliant with his medications over the past two weeks and his weight has been stable.   Jacqualine Code, EMT 08/08/18  ACTION: Home visit completed Next visit  planned for 1 week

## 2018-08-12 ENCOUNTER — Telehealth (HOSPITAL_COMMUNITY): Payer: Self-pay | Admitting: Licensed Clinical Social Worker

## 2018-08-12 NOTE — Telephone Encounter (Signed)
CSW reached out to pt to check in regarding food and medication status at this time. Unable to reach- left voicemail.  CSW encouraged pt to reach out with any concerns and will continue to follow and assist as needed  Jebadiah Imperato H. Josalyn Dettmann, LCSW Clinical Social Worker [facility] [phone]   

## 2018-08-12 NOTE — Telephone Encounter (Signed)
Patient identified as a candidate to receive 14 heart healthy meals per week for 3 months through [facility] partnership with [employer] program.   Completed referral sent in for review.  Anticipate patient will receive first shipment of food in 1-3 business days.  Jeremy Powers H. Idabell Picking, LCSW Clinical Social Worker [facility] Desk#: [phone] Cell#: [phone]   

## 2018-08-20 ENCOUNTER — Other Ambulatory Visit (HOSPITAL_COMMUNITY): Payer: Self-pay

## 2018-08-20 ENCOUNTER — Ambulatory Visit (HOSPITAL_COMMUNITY)
Admission: RE | Admit: 2018-08-20 | Discharge: 2018-08-20 | Disposition: A | Discharge status: Home / Self Care | Payer: Medicaid Other | Source: Ambulatory Visit | Attending: Cardiology | Admitting: Cardiology

## 2018-08-20 ENCOUNTER — Other Ambulatory Visit: Payer: Self-pay

## 2018-08-20 ENCOUNTER — Encounter (HOSPITAL_COMMUNITY): Payer: Self-pay

## 2018-08-20 VITALS — BP 125/64 | HR 60 | Temp 99.1°F | Wt 138.0 lb

## 2018-08-20 DIAGNOSIS — F17210 Nicotine dependence, cigarettes, uncomplicated: Secondary | ICD-10-CM

## 2018-08-20 DIAGNOSIS — I447 Left bundle-branch block, unspecified: Secondary | ICD-10-CM

## 2018-08-20 DIAGNOSIS — I2510 Atherosclerotic heart disease of native coronary artery without angina pectoris: Secondary | ICD-10-CM | POA: Diagnosis not present

## 2018-08-20 DIAGNOSIS — I5022 Chronic systolic (congestive) heart failure: Secondary | ICD-10-CM

## 2018-08-20 DIAGNOSIS — Z720 Tobacco use: Secondary | ICD-10-CM

## 2018-08-20 NOTE — Progress Notes (Signed)
Heart Failure TeleHealth Note  Due to national recommendations of social distancing due to COVID 19, Audio/video telehealth visit is felt to be most appropriate for this patient at this time.  See MyChart message from today for patient consent regarding telehealth for [facility].  Date:  08/20/2018   ID:  Jeremy Powers, DOB 05-Jul-1955, MRN [id number]  Location: Home  Provider location: [address], [place] Type of Visit: Established patient PCP:  [facility]  Cardiologist:  No primary care provider on file. Primary HF: Dr [name]  Chief Complaint:Heart Failure   History of Present Illness: Jeremy Powers is a 63 y.o. male who presents via audio/video conferencing for a telehealth visit today.    Jeremy Powers is a 63 y.o. male with a history of CAD with NSTEMI, s/p PCI to LAD in 2015 and 2016, s/p CABG 2017, microcytic/iron deficiency anemia followed by hematology and GI, LBBB, and tobacco use.  He presented to [facility] ED with SOB, cough, and orthopnea in setting of med non-compliance. Was unable to afford copays with Medicaid. He was tachycardic and tachypneic on arrival, and was transferred to [facility] 06/22/18 for further evaluation. Echo performed which showed newly decreased EF of 25-30% from 50-55% in 12/2017.  Planned for LHC, but deferred due to AKI. CHF asked to see to optimize and follow.  Taken for [facility] which showed severe 1 v disease with occluded LAD within previous stent. Other vessels stable and grafts patent as below. Cath also showed low filling pressures with moderately low cardiac output. Meds adjusted as tolerated.   Today, he being seen with HF Paramedicine for telehealth visit. He has no showed to post hospital visit due to transportation. Denies symptoms of palpitations, chest pain, orthopnea, PND, lower extremity edema, claudication, dizziness, presyncope, syncope, or bleeding. Occasionally short of breath with ambulation. Weight at  home 138 pounds. Productive cough. The patient is tolerating medications without difficulties and is otherwise without complaint today.   He has been set up with [employer]. Smoking 2 cigarettes per day. Lives 6 family members.   He denies symptoms of  fevers, chills,  worrisome for COVID 19.    RHC/LHC 06/24/2018  RA = 1 RV = 19/3 PA = 24/5 (14) PCW = 7 Fick cardiac output/index = 4.0/2.5 PVR = 1.9 WU Ao sat = 95% PA sat = 68%, 71% Assessment: 1. Severe 1-vessel CAD with occluded LAD within previous stent 2. Normal RCA and LCX 3. LIMA to LAD widely patent 4. SVG - D2 widely patent 5. SVG - D1 likely occluded (grafts not marked)  ECHO 06/2018 EF 25-30%  LBBB CMRI 06/27/18 EF 24%. Normal RV  Past Medical History:  Diagnosis Date  . Coronary artery disease   . Hypertension    Past Surgical History:  Procedure Laterality Date  . CARDIAC SURGERY    . RIGHT/LEFT HEART CATH AND CORONARY/GRAFT ANGIOGRAPHY N/A 06/24/2018   Procedure: RIGHT/LEFT HEART CATH AND CORONARY/GRAFT ANGIOGRAPHY;  Surgeon: Dolores Patty, MD;  Location: [facility] INVASIVE CV LAB;  Service: Cardiovascular;  Laterality: N/A;     Current Outpatient Medications  Medication Sig Dispense Refill  . aspirin EC 81 MG tablet Take by mouth.    Marland Kitchen atorvastatin (LIPITOR) 40 MG tablet Take 1 tablet (40 mg total) by mouth daily at 6 PM. 30 tablet 5  . ferrous sulfate 325 (65 FE) MG tablet Take 325 mg by mouth 2 (two) times daily with a  meal.    . losartan (COZAAR) 25 MG tablet Take 1 tablet (25 mg total) by mouth at bedtime. 30 tablet 5  . spironolactone (ALDACTONE) 25 MG tablet Take 0.5 tablets (12.5 mg total) by mouth at bedtime. 15 tablet 5   No current facility-administered medications for this encounter.     Allergies:   Patient has no known allergies.   Social History:  The patient  reports that he has been smoking cigarettes. He has been smoking about 0.50 packs per day. He has never used smokeless tobacco. He reports  current alcohol use of about 2.0 standard drinks of alcohol per week. He reports current drug use. Drug: Marijuana.   Family History:  The patient's family history is not on file.   ROS:  Please see the history of present illness.   All other systems are personally reviewed and negative.   Exam:  Video Health Call; Exam is visual with HF Paramedic General:  Appears weak. No resp difficulty. HEENT: Normal Neck: JVD flat  Cor: Regular pulse  Lungs: EW throughout Abdomen: Non-distended. Pt denies tenderness with self palpation.  Extremities: Pt denies edema. Neuro: Alert & oriented x 3.   Recent Labs: 06/22/2018: ALT 15; B Natriuretic Peptide 4,095.8 06/25/2018: Hemoglobin 14.2; Platelets 164 06/27/2018: BUN 45; Creatinine, Ser 1.20; Magnesium 2.5; Potassium 3.5; Sodium 137  Personally reviewed    Wt Readings from Last 3 Encounters:  08/20/18 62.6 kg (138 lb)  08/20/18 62.6 kg (138 lb)  08/08/18 62.6 kg (138 lb)    Vitals:   08/20/18 1504  BP: 125/64  Pulse: 60  Temp: 99.1 F (37.3 C)  SpO2: 98%     Other studies personally reviewed: Additional studies/ records that were reviewed today include: no   ASSESSMENT AND PLAN:  1.  Chronic Systolic Heart Failure, NICM ECHO 06/2018 EF 25-30% CMRI 06/27/2018 with EF 24%  NYHA IIIb. Volume status stable. He does take a diuretics .   Continue dose losartan and spironolactone.  Will not start bb for now with heart 50-60s.  He will need BMET in 3  weeks.  Need to repeat ECHO in 3-4 months after HF medications optimized.   2. CAD S/P CABG 2017, LHC 06/2018 with 1V disease with occluded LAD within previous stent.  No s/s ischemia.  Continue asa and statin.   3. LBBB  4. URI  Continue OTC. Low grade temp. Instructed to call PCP if he gets worse or develops chest pain or increased dyspnea.   5. Tobacco Abuse Discussed smoking cessation.   COVID screen The patient does not have any symptoms that suggest any further testing/ screening  at this time.  Social distancing reinforced today.  Recommended follow-up: Continue HF Paramedicine.  3 weeks HF Paramedicine . We need to lab work soon but will wait for now.   Relevant cardiac medications were reviewed at length with the patient today.    The patient does not have concerns regarding their medications at this time.   The following changes were made today:  See above   Labs/ tests ordered today include: none   Patient Risk: After full review of this patients clinical status, I feel that they are at moderate risk for cardiac decompensation at this time.  Today, I have spent  minutes with the patient with telehealth technology discussing heart failure and SOB.     Waneta Martins, NP  08/20/2018 3:30 PM  [facility] [address] [facility]  [place] [address] [phone] (office) [phone] (fax)

## 2018-08-20 NOTE — Progress Notes (Signed)
Paramedicine Encounter    Patient ID: Jeremy Powers, male    DOB: May 17, 1956, 63 y.o.   MRN: [id number]   Patient Care Team: [facility] as PCP - General (Pediatrics)  Patient Active Problem List   Diagnosis Date Noted  . Acute systolic (congestive) heart failure (HCC) 06/24/2018  . Aortic insufficiency 06/22/2018  . Acute on chronic congestive heart failure (HCC) 06/22/2018  . Elevated troponin 06/22/2018  . CAD (coronary artery disease) 06/22/2018  . Left bundle branch block 06/22/2018  . History of iron deficiency anemia 06/22/2018  . Acute on chronic heart failure (HCC) 06/22/2018    Current Outpatient Medications:  .  aspirin EC 81 MG tablet, Take by mouth., Disp: , Rfl:  .  atorvastatin (LIPITOR) 40 MG tablet, Take 1 tablet (40 mg total) by mouth daily at 6 PM., Disp: 30 tablet, Rfl: 5 .  ferrous sulfate 325 (65 FE) MG tablet, Take 325 mg by mouth 2 (two) times daily with a meal., Disp: , Rfl:  .  losartan (COZAAR) 25 MG tablet, Take 1 tablet (25 mg total) by mouth at bedtime., Disp: 30 tablet, Rfl: 5 .  spironolactone (ALDACTONE) 25 MG tablet, Take 0.5 tablets (12.5 mg total) by mouth at bedtime., Disp: 15 tablet, Rfl: 5 No Known Allergies    Social History   Socioeconomic History  . Marital status: Single    Spouse name: Not on file  . Number of children: Not on file  . Years of education: Not on file  . Highest education level: Not on file  Occupational History  . Not on file  Social Needs  . Financial resource strain: Not on file  . Food insecurity:    Worry: Not on file    Inability: Not on file  . Transportation needs:    Medical: Not on file    Non-medical: Not on file  Tobacco Use  . Smoking status: Current Every Day Smoker    Packs/day: 0.50    Types: Cigarettes  . Smokeless tobacco: Never Used  Substance and Sexual Activity  . Alcohol use: Yes    Alcohol/week: 2.0 standard drinks    Types: 2 Cans of beer per week   Comment: 2 40oz beer daily  . Drug use: Yes    Types: Marijuana  . Sexual activity: Not on file  Lifestyle  . Physical activity:    Days per week: Not on file    Minutes per session: Not on file  . Stress: Not on file  Relationships  . Social connections:    Talks on phone: Not on file    Gets together: Not on file    Attends religious service: Not on file    Active member of club or organization: Not on file    Attends meetings of clubs or organizations: Not on file    Relationship status: Not on file  . Intimate partner violence:    Fear of current or ex partner: Not on file    Emotionally abused: Not on file    Physically abused: Not on file    Forced sexual activity: Not on file  Other Topics Concern  . Not on file  Social History Narrative  . Not on file    Physical Exam Cardiovascular:     Rate and Rhythm: Normal rate and regular rhythm.     Pulses: Normal pulses.  Pulmonary:     Effort: Pulmonary effort is normal.     Breath sounds:  Wheezing and rhonchi present.  Musculoskeletal: Normal range of motion.     Right lower leg: No edema.     Left lower leg: No edema.  Skin:    General: Skin is warm and dry.     Capillary Refill: Capillary refill takes less than 2 seconds.  Neurological:     Mental Status: He is oriented to person, place, and time.  Psychiatric:        Mood and Affect: Mood normal.         Future Appointments  Date Time Provider Department Center  09/10/2018  3:00 PM [facility] PA/NP [facility] None    BP 125/64 (BP Location: Right Arm, Patient Position: Sitting, Cuff Size: Normal)   Pulse 60   Temp 99.1 F (37.3 C)   Resp 16   Wt 138 lb (62.6 kg)   SpO2 98%   BMI 22.27 kg/m   Weight yesterday- Did not weigh Last visit weight- 138 lb  Jeremy Powers was seen at home today and reported feeling generally well. He reported intermittent chest pain and exertional dyspnea. He has wheezing with rhonchi in all lung fields but does not have an  inhaler. He does have a low grade fever but denied being around anybody that has been sick. I advised that he should contact his PCP if his temperature continues to rise or if his respiratory symptoms worsen. He was understanding and agreeable. He stated he has also had a productive cough for the past week so he was instructed to get Mucinex to help break up any congestion which is present. Per Tonye Becket, NP, no medication changes were made today. We will follow up in three weeks with a tele-health visit. I will follow up next week for a standard home visit.   Jacqualine Code, EMT 08/20/18  ACTION: Home visit completed Next visit planned for 1 week

## 2018-08-27 ENCOUNTER — Other Ambulatory Visit (HOSPITAL_COMMUNITY): Payer: Self-pay

## 2018-08-27 ENCOUNTER — Other Ambulatory Visit (HOSPITAL_COMMUNITY): Payer: Self-pay | Admitting: *Deleted

## 2018-08-27 MED ORDER — SPIRONOLACTONE 25 MG PO TABS: 12.5000 mg | ORAL_TABLET | Freq: Every day | ORAL | 5 refills | Status: DC

## 2018-08-27 NOTE — Progress Notes (Signed)
Paramedicine Encounter    Patient ID: Jeremy Powers, male    DOB: Mar 25, 1956, 63 y.o.   MRN: [id number]   Patient Care Team: [facility] as PCP - General (Pediatrics)  Patient Active Problem List   Diagnosis Date Noted  . Acute systolic (congestive) heart failure (HCC) 06/24/2018  . Aortic insufficiency 06/22/2018  . Acute on chronic congestive heart failure (HCC) 06/22/2018  . Elevated troponin 06/22/2018  . CAD (coronary artery disease) 06/22/2018  . Left bundle branch block 06/22/2018  . History of iron deficiency anemia 06/22/2018  . Acute on chronic heart failure (HCC) 06/22/2018    Current Outpatient Medications:  .  aspirin EC 81 MG tablet, Take by mouth., Disp: , Rfl:  .  atorvastatin (LIPITOR) 40 MG tablet, Take 1 tablet (40 mg total) by mouth daily at 6 PM., Disp: 30 tablet, Rfl: 5 .  ferrous sulfate 325 (65 FE) MG tablet, Take 325 mg by mouth 2 (two) times daily with a meal., Disp: , Rfl:  .  losartan (COZAAR) 25 MG tablet, Take 1 tablet (25 mg total) by mouth at bedtime., Disp: 30 tablet, Rfl: 5 .  spironolactone (ALDACTONE) 25 MG tablet, Take 0.5 tablets (12.5 mg total) by mouth at bedtime., Disp: 15 tablet, Rfl: 5 No Known Allergies    Social History   Socioeconomic History  . Marital status: Single    Spouse name: Not on file  . Number of children: Not on file  . Years of education: Not on file  . Highest education level: Not on file  Occupational History  . Not on file  Social Needs  . Financial resource strain: Not on file  . Food insecurity:    Worry: Not on file    Inability: Not on file  . Transportation needs:    Medical: Not on file    Non-medical: Not on file  Tobacco Use  . Smoking status: Current Every Day Smoker    Packs/day: 0.50    Types: Cigarettes  . Smokeless tobacco: Never Used  Substance and Sexual Activity  . Alcohol use: Yes    Alcohol/week: 2.0 standard drinks    Types: 2 Cans of beer per week     Comment: 2 40oz beer daily  . Drug use: Yes    Types: Marijuana  . Sexual activity: Not on file  Lifestyle  . Physical activity:    Days per week: Not on file    Minutes per session: Not on file  . Stress: Not on file  Relationships  . Social connections:    Talks on phone: Not on file    Gets together: Not on file    Attends religious service: Not on file    Active member of club or organization: Not on file    Attends meetings of clubs or organizations: Not on file    Relationship status: Not on file  . Intimate partner violence:    Fear of current or ex partner: Not on file    Emotionally abused: Not on file    Physically abused: Not on file    Forced sexual activity: Not on file  Other Topics Concern  . Not on file  Social History Narrative  . Not on file    Physical Exam Cardiovascular:     Rate and Rhythm: Normal rate and regular rhythm.     Pulses: Normal pulses.  Pulmonary:     Effort: Pulmonary effort is normal.  Breath sounds: Examination of the right-upper field reveals wheezing. Examination of the left-upper field reveals wheezing and rhonchi. Examination of the right-lower field reveals wheezing. Examination of the left-lower field reveals wheezing and rhonchi. Wheezing and rhonchi present.  Musculoskeletal: Normal range of motion.     Right lower leg: No edema.     Left lower leg: Edema present.  Skin:    General: Skin is warm and dry.     Capillary Refill: Capillary refill takes less than 2 seconds.  Neurological:     Mental Status: He is alert and oriented to person, place, and time.  Psychiatric:        Mood and Affect: Mood normal.         Future Appointments  Date Time Provider Department Center  09/10/2018  3:00 PM [facility] PA/NP [facility] None    BP (!) 146/64 (BP Location: Right Arm, Patient Position: Sitting, Cuff Size: Normal)   Pulse 74   Resp 18   Wt 138 lb (62.6 kg)   SpO2 97%   BMI 22.27 kg/m   Weight yesterday- Did not  weigh Last visit weight- 138 lb  [name] was seen at home today and reported feeling well. He denied chest pain, SOB, headache, dizziness, orthopnea or fever. He has had a productive cough over the past week which was also noted at out last appointment but he said it has not worsened. He stated he has been compliant with his medications over the past week and his weight has been stable. His medications were verified but his pillbox was still full from our last visit. He stated that he prefers to take his pills out of the bottles because it is easier if he is away from home. He does appear to be taking his medications as he reports judging by a pill count. I will keep an eye on this moving forward to ensure he doesn't start forgetting.   Jacqualine Code, EMT 08/27/18  ACTION: Home visit completed Next visit planned for 1 week

## 2018-09-02 ENCOUNTER — Telehealth (HOSPITAL_COMMUNITY): Payer: Self-pay

## 2018-09-02 NOTE — Telephone Encounter (Signed)
I called Ms Jeremy Powers to schedule an appointment with [name]. She did not answer so I left a message with my information and reason for calling and asked that she call me back as soon as possible.

## 2018-09-04 ENCOUNTER — Telehealth (HOSPITAL_COMMUNITY): Payer: Self-pay

## 2018-09-04 NOTE — Telephone Encounter (Signed)
I called Jeremy Powers to schedule an appointment with Mr Greenough. She did not answer so I left a message requesting she call me back to set up a date and time.

## 2018-09-09 ENCOUNTER — Telehealth (HOSPITAL_COMMUNITY): Payer: Self-pay

## 2018-09-09 NOTE — Telephone Encounter (Signed)
I called Jeremy Powers to schedule an appointment for tomorrow. He stated he would be home all day so I could come at any time. I advised that I would come around 15:30 or 16:00 and he was agreeable.

## 2018-09-10 ENCOUNTER — Other Ambulatory Visit: Payer: Self-pay

## 2018-09-10 ENCOUNTER — Ambulatory Visit (HOSPITAL_COMMUNITY)
Admission: RE | Admit: 2018-09-10 | Discharge: 2018-09-10 | Disposition: A | Discharge status: Home / Self Care | Payer: Medicaid Other | Source: Ambulatory Visit | Attending: Adult Health | Admitting: Adult Health

## 2018-09-10 ENCOUNTER — Other Ambulatory Visit (HOSPITAL_COMMUNITY): Payer: Self-pay

## 2018-09-10 VITALS — BP 156/82 | HR 85 | Wt 131.4 lb

## 2018-09-10 DIAGNOSIS — I2510 Atherosclerotic heart disease of native coronary artery without angina pectoris: Secondary | ICD-10-CM

## 2018-09-10 DIAGNOSIS — I5022 Chronic systolic (congestive) heart failure: Secondary | ICD-10-CM | POA: Diagnosis not present

## 2018-09-10 DIAGNOSIS — I10 Essential (primary) hypertension: Secondary | ICD-10-CM

## 2018-09-10 DIAGNOSIS — Z720 Tobacco use: Secondary | ICD-10-CM

## 2018-09-10 MED ORDER — CARVEDILOL 3.125 MG PO TABS: 3.1250 mg | ORAL_TABLET | Freq: Two times a day (BID) | ORAL | 3 refills | Status: DC

## 2018-09-10 NOTE — Progress Notes (Signed)
Heart Failure TeleHealth Note  Due to national recommendations of social distancing due to COVID 19, Audio/video telehealth visit is felt to be most appropriate for this patient at this time.  See MyChart message from today for patient consent regarding telehealth for [facility].  Date:  09/10/2018   ID:  Jeremy Powers, DOB June 23, 1955, MRN [id number]  Location: Home  Provider location: [place] [facility] Type of Visit: Established patient   PCP:  [facility]  Cardiologist:  No primary care provider on file. Primary HF: Dr [name]   Chief Complaint: Heart Failure  History of Present Illness: Jeremy Powers is a 63 y.o. male with a history of CAD with NSTEMI, s/p PCI to LAD in 2015 and 2016, s/p CABG 2017, microcytic/iron deficiency anemia followed by hematology and GI, LBBB, and tobacco use.  He presented to [facility] ED with SOB, cough, and orthopnea in setting of med non-compliance. Was unable to afford copays with Medicaid. He was tachycardic and tachypneic on arrival, and was transferred to [facility] 06/22/18 for further evaluation. Echo performed which showed newly decreased EF of 25-30% from 50-55% in 12/2017. Planned for LHC, but deferred due to AKI. CHF asked to see to optimize and follow. Taken for [facility] which showed severe 1 v disease with occluded LAD within previous stent. Other vessels stable and grafts patent as below. Cath also showed low filling pressures with moderately low cardiac output. Meds adjusted as tolerated.   He presents via Programmer, applications for a telehealth visit today. Overall feeling better today. SOB if he walks over 1 hour. Denies PND/Orthopnea. Denies chest pain. Appetite ok. No fever or chills. Weight at home 131 pounds.  Taking all medications but he has not had them today.Followed by HF Paramedicine. His girlfriend gets food from grocery store. Has a hard time paying for medications.     He denies symptoms worrisome  for COVID 19.   RHC/LHC 06/24/2018  RA = 1 RV = 19/3 PA = 24/5 (14) PCW = 7 Fick cardiac output/index = 4.0/2.5 PVR = 1.9 WU Ao sat = 95% PA sat = 68%, 71% Assessment: 1. Severe 1-vessel CAD with occluded LAD within previous stent 2. Normal RCA and LCX 3. LIMA to LAD widely patent 4. SVG - D2 widely patent 5. SVG - D1 likely occluded (grafts not marked)  ECHO 06/2018 EF 25-30%  LBBB CMRI 06/27/18 EF 24%. Normal RV   Past Medical History:  Diagnosis Date  . Coronary artery disease   . Hypertension    Past Surgical History:  Procedure Laterality Date  . CARDIAC SURGERY    . RIGHT/LEFT HEART CATH AND CORONARY/GRAFT ANGIOGRAPHY N/A 06/24/2018   Procedure: RIGHT/LEFT HEART CATH AND CORONARY/GRAFT ANGIOGRAPHY;  Surgeon: Dolores Patty, MD;  Location: [facility] INVASIVE CV LAB;  Service: Cardiovascular;  Laterality: N/A;     Current Outpatient Medications  Medication Sig Dispense Refill  . albuterol (PROVENTIL HFA;VENTOLIN HFA) 108 (90 Base) MCG/ACT inhaler Inhale 2 puffs into the lungs every 6 (six) hours as needed for wheezing or shortness of breath. 1 Inhaler 2  . atorvastatin (LIPITOR) 40 MG tablet Take 1 tablet (40 mg total) by mouth daily at 6 PM. 30 tablet 5  . ferrous sulfate 325 (65 FE) MG tablet Take 325 mg by mouth 2 (two) times daily with a meal.    . losartan (COZAAR) 25 MG tablet Take 1 tablet (25 mg total) by mouth at bedtime. 30 tablet  5  . spironolactone (ALDACTONE) 25 MG tablet Take 0.5 tablets (12.5 mg total) by mouth at bedtime. 15 tablet 5   No current facility-administered medications for this encounter.     Allergies:   Patient has no known allergies.   Social History:  The patient  reports that he has been smoking cigarettes. He has been smoking about 0.50 packs per day. He has never used smokeless tobacco. He reports current alcohol use of about 2.0 standard drinks of alcohol per week. He reports current drug use. Drug: Marijuana.   Family History:  The  patient's family history is not on file.   ROS:  Please see the history of present illness.  All other systems are personally reviewed and negative.   Exam:  Video Health Call; Exam is visual. General:  Speaks in full sentences. No resp difficulty. Lungs: Normal respiratory effort with conversation.  Abdomen: Non-distended per patient report Extremities: Pt denies edema. Neuro: Alert & oriented x 3.   Recent Labs: 06/22/2018: ALT 15; B Natriuretic Peptide 4,095.8 06/25/2018: Hemoglobin 14.2; Platelets 164 06/27/2018: BUN 45; Creatinine, Ser 1.20; Magnesium 2.5; Potassium 3.5; Sodium 137  Personally reviewed   Wt Readings from Last 3 Encounters:  09/10/18 59.6 kg (131 lb 6.4 oz)  09/10/18 59.6 kg (131 lb 6.4 oz)  08/27/18 62.6 kg (138 lb)    Vitals:   09/10/18 1543  BP: (!) 156/82  Pulse: 85  SpO2: 99%     ASSESSMENT AND PLAN:  1.  Chronic Systolic Heart Failure, NICM ECHO 06/2018 EF 25-30% CMRI 06/27/2018 with EF 24%  NYHA II-IIII. Functional improvement. Volume status stable. He does not need diuretics.   Add carvedilol 3.125 mg twice a day.  Continue dose losartan and spironolactone.  Need to repeat ECHO in 3-4 months after HF medications optimized.   2. CAD S/P CABG 2017, LHC 06/2018 with 1V disease with occluded LAD within previous stent.  No s/s ischemia. Continue aspirin 81 mg daily.    3. LBBB  4.  Tobacco Abuse Discussed smoking cessation.  5. HTN Elevated today but he hasn't had his medications. Stressed the importance of taking medications.    COVID screen The patient does not have any symptoms that suggest any further testing/ screening at this time.  Social distancing reinforced today.  Patient Risk: After full review of this patients clinical status, I feel that they are at moderate risk for cardiac decompensation at this time.  Relevant cardiac medications were reviewed at length with the patient today. The patient does not have concerns regarding their  medications at this time.   The following changes were made today:  Adding carvedilol 3.125 mg twice Recommended follow-up: Follow up in 2 weeks for virtual visit with me and HF Paramedic. Follow up in 3 months with Dr [name] and ECHO.    Today, I have spent 20 minutes with the patient with telehealth technology discussing the above issues .  Continue HF Paramedicine.   Waneta Martins, NP  09/10/2018 3:53 PM  [facility] [address] [facility] [place] [address] [phone] (office) [phone] (fax)

## 2018-09-10 NOTE — Progress Notes (Signed)
Paramedicine Encounter    Patient ID: SAVVA ADINOLFI, male    DOB: [birth year], 63 y.o.   MRN: [id number]   Patient Care Team: [facility] as PCP - General (Pediatrics)  Patient Active Problem List   Diagnosis Date Noted  . Acute systolic (congestive) heart failure (HCC) 06/24/2018  . Aortic insufficiency 06/22/2018  . Acute on chronic congestive heart failure (HCC) 06/22/2018  . Elevated troponin 06/22/2018  . CAD (coronary artery disease) 06/22/2018  . Left bundle branch block 06/22/2018  . History of iron deficiency anemia 06/22/2018  . Acute on chronic heart failure (HCC) 06/22/2018    Current Outpatient Medications:  .  albuterol (PROVENTIL HFA;VENTOLIN HFA) 108 (90 Base) MCG/ACT inhaler, Inhale 2 puffs into the lungs every 6 (six) hours as needed for wheezing or shortness of breath., Disp: 1 Inhaler, Rfl: 2 .  atorvastatin (LIPITOR) 40 MG tablet, Take 1 tablet (40 mg total) by mouth daily at 6 PM., Disp: 30 tablet, Rfl: 5 .  ferrous sulfate 325 (65 FE) MG tablet, Take 325 mg by mouth 2 (two) times daily with a meal., Disp: , Rfl:  .  losartan (COZAAR) 25 MG tablet, Take 1 tablet (25 mg total) by mouth at bedtime., Disp: 30 tablet, Rfl: 5 .  spironolactone (ALDACTONE) 25 MG tablet, Take 0.5 tablets (12.5 mg total) by mouth at bedtime., Disp: 15 tablet, Rfl: 5 No Known Allergies    Social History   Socioeconomic History  . Marital status: Single    Spouse name: Not on file  . Number of children: Not on file  . Years of education: Not on file  . Highest education level: Not on file  Occupational History  . Not on file  Social Needs  . Financial resource strain: Not on file  . Food insecurity:    Worry: Not on file    Inability: Not on file  . Transportation needs:    Medical: Not on file    Non-medical: Not on file  Tobacco Use  . Smoking status: Current Every Day Smoker    Packs/day: 0.50    Types: Cigarettes  . Smokeless tobacco: Never  Used  Substance and Sexual Activity  . Alcohol use: Yes    Alcohol/week: 2.0 standard drinks    Types: 2 Cans of beer per week    Comment: 2 40oz beer daily  . Drug use: Yes    Types: Marijuana  . Sexual activity: Not on file  Lifestyle  . Physical activity:    Days per week: Not on file    Minutes per session: Not on file  . Stress: Not on file  Relationships  . Social connections:    Talks on phone: Not on file    Gets together: Not on file    Attends religious service: Not on file    Active member of club or organization: Not on file    Attends meetings of clubs or organizations: Not on file    Relationship status: Not on file  . Intimate partner violence:    Fear of current or ex partner: Not on file    Emotionally abused: Not on file    Physically abused: Not on file    Forced sexual activity: Not on file  Other Topics Concern  . Not on file  Social History Narrative  . Not on file    Physical Exam Cardiovascular:     Rate and Rhythm: Normal rate and regular rhythm.  Pulses: Normal pulses.  Pulmonary:     Effort: Pulmonary effort is normal.     Breath sounds: Normal breath sounds.  Musculoskeletal: Normal range of motion.     Right lower leg: No edema.     Left lower leg: No edema.  Skin:    General: Skin is warm and dry.     Capillary Refill: Capillary refill takes less than 2 seconds.  Neurological:     Mental Status: He is alert and oriented to person, place, and time.  Psychiatric:        Mood and Affect: Mood normal.         No future appointments.  BP (!) 156/82 (BP Location: Left Arm, Patient Position: Sitting, Cuff Size: Normal)   Pulse 85   Resp 16   Wt 131 lb 6.4 oz (59.6 kg)   SpO2 99%   BMI 21.21 kg/m   Weight yesterday- 131.4 lb Last visit weight- 138 lb  Mr Pasqual was seen at home today and reported feeling generally well. He denied chest pain, SOB, headache, dizziness, orthopnea, cough or fever since our last visit. He  reported being compliant with his medications since our last visit and his weight is trending down. He had not taken his medications this morning because he had not eaten yet. I explained the importance of taking his medications as directed and he advised he would start doing better. This visit was done in conjunction with Tonye Becket, NP, who advised to add carvedilol 3.125 mg BID. This medications was sent to the pharmacy and he stated he would pick it up tomorrow. All other medications were present in his pillbox and he stated he would pick up refills tomorrow. I requested he call me when he picks it up if he needs help sorting where to put carvedilol in his pillbox. He was understanding and agreeable.   Jacqualine Code, EMT 09/10/18  ACTION: Home visit completed Next visit planned for 1 week

## 2018-09-10 NOTE — Patient Instructions (Addendum)
Start carvedilol 3.125 mg (1 tab) twice a day  Follow up in 2 weeks for virtual visit with me and HF Paramedic.   Follow up in 3 months with Dr [name] and ECHO.

## 2018-09-10 NOTE — Progress Notes (Signed)
LM with daughter, Rx called into pharmacy.  Pt to be called for an appt.

## 2018-09-12 ENCOUNTER — Telehealth (HOSPITAL_COMMUNITY): Payer: Self-pay | Admitting: Licensed Clinical Social Worker

## 2018-09-12 NOTE — Telephone Encounter (Signed)
CSW reached out to pt to check in regarding food and medication status at this time. Spoke with pt dtr who reports they are all doing well and that the patient has no needs at this time.  Patient trying to continue walking and was actually at the park at time of call.  CSW encouraged pt to reach out with any concerns and will continue to follow and assist as needed  Burna Sis, LCSW Clinical Social Worker [facility] [phone]

## 2018-09-17 ENCOUNTER — Telehealth (HOSPITAL_COMMUNITY): Payer: Self-pay

## 2018-09-17 NOTE — Telephone Encounter (Signed)
I called Ms Jeremy Powers to schedule an appointment with Mr Jeremy Powers. She stated she was not home with him but she did not think there would be an issue with me coming tomorrow. I told her I would come by tomorrow but I would call before to make sure it was still OK for me to come. She was understanding and agreeable

## 2018-09-18 ENCOUNTER — Other Ambulatory Visit (HOSPITAL_COMMUNITY): Payer: Self-pay

## 2018-09-18 NOTE — Progress Notes (Signed)
Paramedicine Encounter    Patient ID: Jeremy Powers, male    DOB: 08-Jul-1955, 63 y.o.   MRN: [id number]   Patient Care Team: [facility] as PCP - General (Pediatrics)  Patient Active Problem List   Diagnosis Date Noted  . Acute systolic (congestive) heart failure (HCC) 06/24/2018  . Aortic insufficiency 06/22/2018  . Acute on chronic congestive heart failure (HCC) 06/22/2018  . Elevated troponin 06/22/2018  . CAD (coronary artery disease) 06/22/2018  . Left bundle branch block 06/22/2018  . History of iron deficiency anemia 06/22/2018  . Acute on chronic heart failure (HCC) 06/22/2018    Current Outpatient Medications:  .  albuterol (PROVENTIL HFA;VENTOLIN HFA) 108 (90 Base) MCG/ACT inhaler, Inhale 2 puffs into the lungs every 6 (six) hours as needed for wheezing or shortness of breath., Disp: 1 Inhaler, Rfl: 2 .  ferrous sulfate 325 (65 FE) MG tablet, Take 325 mg by mouth 2 (two) times daily with a meal., Disp: , Rfl:  .  losartan (COZAAR) 25 MG tablet, Take 1 tablet (25 mg total) by mouth at bedtime., Disp: 30 tablet, Rfl: 5 .  aspirin EC 81 MG tablet, Take 81 mg by mouth daily., Disp: , Rfl:  .  atorvastatin (LIPITOR) 40 MG tablet, Take 1 tablet (40 mg total) by mouth daily at 6 PM., Disp: 30 tablet, Rfl: 5 .  carvedilol (COREG) 3.125 MG tablet, Take 1 tablet (3.125 mg total) by mouth 2 (two) times daily for 30 days., Disp: 60 tablet, Rfl: 3 .  spironolactone (ALDACTONE) 25 MG tablet, Take 0.5 tablets (12.5 mg total) by mouth at bedtime., Disp: 15 tablet, Rfl: 5 No Known Allergies    Social History   Socioeconomic History  . Marital status: Single    Spouse name: Not on file  . Number of children: Not on file  . Years of education: Not on file  . Highest education level: Not on file  Occupational History  . Not on file  Social Needs  . Financial resource strain: Not on file  . Food insecurity:    Worry: Not on file    Inability: Not on file   . Transportation needs:    Medical: Not on file    Non-medical: Not on file  Tobacco Use  . Smoking status: Current Every Day Smoker    Packs/day: 0.50    Types: Cigarettes  . Smokeless tobacco: Never Used  Substance and Sexual Activity  . Alcohol use: Yes    Alcohol/week: 2.0 standard drinks    Types: 2 Cans of beer per week    Comment: 2 40oz beer daily  . Drug use: Yes    Types: Marijuana  . Sexual activity: Not on file  Lifestyle  . Physical activity:    Days per week: Not on file    Minutes per session: Not on file  . Stress: Not on file  Relationships  . Social connections:    Talks on phone: Not on file    Gets together: Not on file    Attends religious service: Not on file    Active member of club or organization: Not on file    Attends meetings of clubs or organizations: Not on file    Relationship status: Not on file  . Intimate partner violence:    Fear of current or ex partner: Not on file    Emotionally abused: Not on file    Physically abused: Not on file  Forced sexual activity: Not on file  Other Topics Concern  . Not on file  Social History Narrative  . Not on file    Physical Exam Cardiovascular:     Rate and Rhythm: Normal rate and regular rhythm.     Pulses: Normal pulses.  Pulmonary:     Effort: Pulmonary effort is normal.     Breath sounds: Normal breath sounds.  Musculoskeletal: Normal range of motion.     Right lower leg: No edema.     Left lower leg: No edema.  Skin:    General: Skin is warm and dry.     Capillary Refill: Capillary refill takes less than 2 seconds.  Neurological:     Mental Status: He is alert and oriented to person, place, and time.  Psychiatric:        Mood and Affect: Mood normal.         Future Appointments  Date Time Provider Department Center  09/24/2018  3:00 PM [facility] PA/NP [facility] None    BP (!) 148/70 (BP Location: Left Arm, Patient Position: Sitting, Cuff Size: Normal)   Pulse 80   Resp 18    Wt 129 lb 12.8 oz (58.9 kg)   SpO2 99%   BMI 20.95 kg/m   Weight yesterday- 135 lb Last visit weight- 131 lb  Jeremy Powers was seen at home today and reported feeling generally well. He denied chest pain, SOB, headache, dizziness, orthopnea, cough or fever since our last visit. He stated he has been compliant with his medications and his weight has been stable. His only complaint today was that he was feeling slightly nauseated due to taking his medications on an empty stomach. He stated this resolved after he ate breakfast. He had not picked up the medications from the pharmacy which we ordered last week because he was unable to afford them. I went to the pharmacy and picked up the medications for this month and advised that the clinic could cover the cost this month and he could have some time to save for his next refills. He was understanding and agreeable. His medications were verified and his pillbox was refilled. I will follow up next week.   Jacqualine Code, EMT 09/18/18  ACTION: Home visit completed Next visit planned for 1 week

## 2018-09-24 ENCOUNTER — Telehealth (HOSPITAL_COMMUNITY): Payer: Self-pay

## 2018-09-24 ENCOUNTER — Ambulatory Visit (HOSPITAL_COMMUNITY)
Admission: RE | Admit: 2018-09-24 | Discharge: 2018-09-24 | Disposition: A | Discharge status: Home / Self Care | Payer: Medicaid Other | Source: Ambulatory Visit | Attending: Internal Medicine | Admitting: Internal Medicine

## 2018-09-24 ENCOUNTER — Other Ambulatory Visit: Payer: Self-pay

## 2018-09-24 NOTE — Telephone Encounter (Signed)
I called Ms Jeremy Powers to schedule an appointment with Mr Jeremy Powers. She did not answer so I left a voicemail requesting she call me back when she is available.

## 2018-09-24 NOTE — Telephone Encounter (Signed)
I called Ms Jeremy Powers to reach Jeremy Powers for an appointment. She stated they were not home at the time and asked if I could come back on Thursday. I advised that Friday would be more likely and she was agreeable.

## 2018-09-26 ENCOUNTER — Other Ambulatory Visit (HOSPITAL_COMMUNITY): Payer: Self-pay

## 2018-09-26 ENCOUNTER — Telehealth (HOSPITAL_COMMUNITY): Payer: Self-pay

## 2018-09-26 DIAGNOSIS — I5022 Chronic systolic (congestive) heart failure: Secondary | ICD-10-CM

## 2018-09-26 MED ORDER — FUROSEMIDE 40 MG PO TABS: 40.0000 mg | ORAL_TABLET | ORAL | 3 refills | Status: DC | PRN

## 2018-09-26 NOTE — Progress Notes (Signed)
Paramedicine Encounter    Patient ID: Jeremy Powers, male    DOB: 1955-07-23, 63 y.o.   MRN: [id number]   Patient Care Team: [facility] as PCP - General (Pediatrics)  Patient Active Problem List   Diagnosis Date Noted  . Acute systolic (congestive) heart failure (HCC) 06/24/2018  . Aortic insufficiency 06/22/2018  . Acute on chronic congestive heart failure (HCC) 06/22/2018  . Elevated troponin 06/22/2018  . CAD (coronary artery disease) 06/22/2018  . Left bundle branch block 06/22/2018  . History of iron deficiency anemia 06/22/2018  . Acute on chronic heart failure (HCC) 06/22/2018    Current Outpatient Medications:  .  albuterol (PROVENTIL HFA;VENTOLIN HFA) 108 (90 Base) MCG/ACT inhaler, Inhale 2 puffs into the lungs every 6 (six) hours as needed for wheezing or shortness of breath., Disp: 1 Inhaler, Rfl: 2 .  aspirin EC 81 MG tablet, Take 81 mg by mouth daily., Disp: , Rfl:  .  atorvastatin (LIPITOR) 40 MG tablet, Take 1 tablet (40 mg total) by mouth daily at 6 PM., Disp: 30 tablet, Rfl: 5 .  carvedilol (COREG) 3.125 MG tablet, Take 1 tablet (3.125 mg total) by mouth 2 (two) times daily for 30 days., Disp: 60 tablet, Rfl: 3 .  ferrous sulfate 325 (65 FE) MG tablet, Take 325 mg by mouth 2 (two) times daily with a meal., Disp: , Rfl:  .  losartan (COZAAR) 25 MG tablet, Take 1 tablet (25 mg total) by mouth at bedtime., Disp: 30 tablet, Rfl: 5 .  spironolactone (ALDACTONE) 25 MG tablet, Take 0.5 tablets (12.5 mg total) by mouth at bedtime., Disp: 15 tablet, Rfl: 5 No Known Allergies    Social History   Socioeconomic History  . Marital status: Single    Spouse name: Not on file  . Number of children: Not on file  . Years of education: Not on file  . Highest education level: Not on file  Occupational History  . Not on file  Social Needs  . Financial resource strain: Not on file  . Food insecurity:    Worry: Not on file    Inability: Not on file   . Transportation needs:    Medical: Not on file    Non-medical: Not on file  Tobacco Use  . Smoking status: Current Every Day Smoker    Packs/day: 0.50    Types: Cigarettes  . Smokeless tobacco: Never Used  Substance and Sexual Activity  . Alcohol use: Yes    Alcohol/week: 2.0 standard drinks    Types: 2 Cans of beer per week    Comment: 2 40oz beer daily  . Drug use: Yes    Types: Marijuana  . Sexual activity: Not on file  Lifestyle  . Physical activity:    Days per week: Not on file    Minutes per session: Not on file  . Stress: Not on file  Relationships  . Social connections:    Talks on phone: Not on file    Gets together: Not on file    Attends religious service: Not on file    Active member of club or organization: Not on file    Attends meetings of clubs or organizations: Not on file    Relationship status: Not on file  . Intimate partner violence:    Fear of current or ex partner: Not on file    Emotionally abused: Not on file    Physically abused: Not on file  Forced sexual activity: Not on file  Other Topics Concern  . Not on file  Social History Narrative  . Not on file    Physical Exam Cardiovascular:     Rate and Rhythm: Normal rate and regular rhythm.     Pulses: Normal pulses.  Pulmonary:     Effort: Pulmonary effort is normal.     Breath sounds: Examination of the right-upper field reveals wheezing. Wheezing present.  Musculoskeletal:     Right lower leg: No edema.     Left lower leg: No edema.  Skin:    General: Skin is warm and dry.     Capillary Refill: Capillary refill takes less than 2 seconds.  Neurological:     Mental Status: He is oriented to person, place, and time.  Psychiatric:        Mood and Affect: Mood normal.         No future appointments.  BP (!) 153/85 (BP Location: Left Arm, Patient Position: Sitting, Cuff Size: Normal)   Pulse 78   Resp 18   Wt 137 lb 12.8 oz (62.5 kg)   SpO2 99%   BMI 22.24 kg/m    Weight yesterday- Did not weigh Last visit weight- 129 lb  Jeremy Powers was seen at home today and reported feeling generally well. He denied chest pain, headache, dizziness, orthopnea or fever. He did complain of mild SOB with minimal wheezing located in his right upper lobe but stated this is common for him before he uses his inhaler. Upon looking at his pillbox, it was evident that he is not taking his medications as prescribed. I asked about this and he said he "was nervous and didn't want to take the wrong thing." I explained that his pillbox is set up for him specifically each week and he has no need to fear taking them incorrectly. He expressed understanding and said he would begin taking the from his pillbox as they are put in. He had gained 8 lb since last week but did not exhibit any edema. I contacted the HF clinic who stated to have him take lasix for two days and check in next week. I went to the pharmacy and picked up the new prescription of lasix and amended his pillbox to reflect the change.    Jacqualine Code, EMT 09/26/18  ACTION: Home visit completed Next visit planned for 1 week

## 2018-09-26 NOTE — Addendum Note (Signed)
Addended by: Red Christians A on: 09/26/2018 12:34 PM   Modules accepted: Orders

## 2018-09-26 NOTE — Telephone Encounter (Signed)
Received call from [place], emtp. Pt weight up 8lbs in 1 week. Pt c/o shortness of breath. No edema.   Per v/o from [facility] NP-C have pt take 40mg  of Furosemide x2 days and bmet and bnp next week.  Pt verbalized understanding and is agreeable with upcoming lab appointment.

## 2018-10-01 ENCOUNTER — Other Ambulatory Visit (HOSPITAL_COMMUNITY): Payer: Self-pay

## 2018-10-01 ENCOUNTER — Telehealth (HOSPITAL_COMMUNITY): Payer: Self-pay

## 2018-10-01 ENCOUNTER — Other Ambulatory Visit (HOSPITAL_COMMUNITY): Payer: Medicaid Other

## 2018-10-01 NOTE — Telephone Encounter (Signed)
Jeremy Powers called me on behalf or Mr Baumgardner. She stated they were not going to be able to keep his lab appointment today due to transportation issues so I was able to reschedule them for tomorrow at 14:00. She also stated that I could come today around 12:00.

## 2018-10-01 NOTE — Progress Notes (Signed)
Paramedicine Encounter    Patient ID: Jeremy Powers, male    DOB: [birth year], 63 y.o.   MRN: [id number]   Patient Care Team: [facility] as PCP - General (Pediatrics)  Patient Active Problem List   Diagnosis Date Noted  . Acute systolic (congestive) heart failure (HCC) 06/24/2018  . Aortic insufficiency 06/22/2018  . Acute on chronic congestive heart failure (HCC) 06/22/2018  . Elevated troponin 06/22/2018  . CAD (coronary artery disease) 06/22/2018  . Left bundle branch block 06/22/2018  . History of iron deficiency anemia 06/22/2018  . Acute on chronic heart failure (HCC) 06/22/2018    Current Outpatient Medications:  .  albuterol (PROVENTIL HFA;VENTOLIN HFA) 108 (90 Base) MCG/ACT inhaler, Inhale 2 puffs into the lungs every 6 (six) hours as needed for wheezing or shortness of breath., Disp: 1 Inhaler, Rfl: 2 .  aspirin EC 81 MG tablet, Take 81 mg by mouth daily., Disp: , Rfl:  .  atorvastatin (LIPITOR) 40 MG tablet, Take 1 tablet (40 mg total) by mouth daily at 6 PM., Disp: 30 tablet, Rfl: 5 .  carvedilol (COREG) 3.125 MG tablet, Take 1 tablet (3.125 mg total) by mouth 2 (two) times daily for 30 days., Disp: 60 tablet, Rfl: 3 .  ferrous sulfate 325 (65 FE) MG tablet, Take 325 mg by mouth 2 (two) times daily with a meal., Disp: , Rfl:  .  furosemide (LASIX) 40 MG tablet, Take 1 tablet (40 mg total) by mouth as needed. Take as directed by the [facility], Disp: 30 tablet, Rfl: 3 .  losartan (COZAAR) 25 MG tablet, Take 1 tablet (25 mg total) by mouth at bedtime., Disp: 30 tablet, Rfl: 5 .  spironolactone (ALDACTONE) 25 MG tablet, Take 0.5 tablets (12.5 mg total) by mouth at bedtime., Disp: 15 tablet, Rfl: 5 No Known Allergies    Social History   Socioeconomic History  . Marital status: Single    Spouse name: Not on file  . Number of children: Not on file  . Years of education: Not on file  . Highest education level: Not on file   Occupational History  . Not on file  Social Needs  . Financial resource strain: Not on file  . Food insecurity:    Worry: Not on file    Inability: Not on file  . Transportation needs:    Medical: Not on file    Non-medical: Not on file  Tobacco Use  . Smoking status: Current Every Day Smoker    Packs/day: 0.50    Types: Cigarettes  . Smokeless tobacco: Never Used  Substance and Sexual Activity  . Alcohol use: Yes    Alcohol/week: 2.0 standard drinks    Types: 2 Cans of beer per week    Comment: 2 40oz beer daily  . Drug use: Yes    Types: Marijuana  . Sexual activity: Not on file  Lifestyle  . Physical activity:    Days per week: Not on file    Minutes per session: Not on file  . Stress: Not on file  Relationships  . Social connections:    Talks on phone: Not on file    Gets together: Not on file    Attends religious service: Not on file    Active member of club or organization: Not on file    Attends meetings of clubs or organizations: Not on file    Relationship status: Not on file  . Intimate partner violence:  Fear of current or ex partner: Not on file    Emotionally abused: Not on file    Physically abused: Not on file    Forced sexual activity: Not on file  Other Topics Concern  . Not on file  Social History Narrative  . Not on file    Physical Exam Cardiovascular:     Rate and Rhythm: Normal rate and regular rhythm.  Pulmonary:     Effort: Pulmonary effort is normal.     Breath sounds: Normal breath sounds.  Musculoskeletal: Normal range of motion.     Right lower leg: No edema.     Left lower leg: No edema.  Skin:    General: Skin is warm and dry.     Capillary Refill: Capillary refill takes less than 2 seconds.  Neurological:     Mental Status: He is alert and oriented to person, place, and time.  Psychiatric:        Mood and Affect: Mood normal.         Future Appointments  Date Time Provider Department Center  10/02/2018  2:00 PM  [facility] None    BP (!) 144/86 (BP Location: Left Arm, Patient Position: Sitting, Cuff Size: Normal)   Pulse 72   Resp 16   Wt 139 lb (63 kg)   SpO2 100%   BMI 22.44 kg/m   Weight yesterday- 138 lb Last visit weight- 137.8 lb  Jeremy Powers was seen at home today and reported feeling well. He denied chest pain, SOB, headache, dizziness, orthopnea, cough or fever since our last visit. He had missed several doses of his medications over the past week, most of which were his evening doses. He stated he would try to do better in the coming week. His medications were verified and his pillbox was refilled. I expressed concern over his failure to follow his medication regimen and explained how this correlated to his hypertension. He said that the medicine makes him feel more tired than normal and I explained that is to be expected at first but he has to push through the first few weeks. He expressed understanding and restated that he would do better in the coming week. I will follow up next week.   Jacqualine Code, EMT 10/01/18  ACTION: Home visit completed Next visit planned for 1 week

## 2018-10-01 NOTE — Telephone Encounter (Signed)
I called Ms Jeremy Powers to schedule an appointment with Mr Jeremy Powers. She did not answer so I left a message requesting she call me back when she is able.

## 2018-10-02 ENCOUNTER — Other Ambulatory Visit (HOSPITAL_COMMUNITY): Payer: Medicaid Other

## 2018-10-03 ENCOUNTER — Other Ambulatory Visit: Payer: Self-pay

## 2018-10-03 ENCOUNTER — Ambulatory Visit (HOSPITAL_COMMUNITY)
Admission: RE | Admit: 2018-10-03 | Discharge: 2018-10-03 | Disposition: A | Discharge status: Home / Self Care | Payer: Medicaid Other | Source: Ambulatory Visit | Attending: Cardiology | Admitting: Cardiology

## 2018-10-03 DIAGNOSIS — I5022 Chronic systolic (congestive) heart failure: Secondary | ICD-10-CM | POA: Insufficient documentation

## 2018-10-03 LAB — BASIC METABOLIC PANEL
Anion gap: 10 (ref 5–15)
BUN: 7 mg/dL — ABNORMAL LOW (ref 8–23)
CO2: 22 mmol/L (ref 22–32)
Calcium: 8.5 mg/dL — ABNORMAL LOW (ref 8.9–10.3)
Chloride: 110 mmol/L (ref 98–111)
Creatinine, Ser: 0.73 mg/dL (ref 0.61–1.24)
GFR calc Af Amer: >60 mL/min (ref >60)
GFR calc non Af Amer: >60 mL/min (ref >60)
Glucose, Bld: 77 mg/dL (ref 70–99)
Potassium: 4 mmol/L (ref 3.5–5.1)
Sodium: 142 mmol/L (ref 135–145)

## 2018-10-03 LAB — BRAIN NATRIURETIC PEPTIDE: B Natriuretic Peptide: 2790.1 pg/mL — ABNORMAL HIGH (ref 0.0–100.0)

## 2018-10-07 ENCOUNTER — Telehealth (HOSPITAL_COMMUNITY): Payer: Self-pay

## 2018-10-07 NOTE — Telephone Encounter (Signed)
After several unsuccessful attempts at reaching patient, called Jeremy Powers of paramedicine today to alert him of recommendations from NP and unsuccessful attempts to reach patient.Marland Kitchen  He was able to contact patient and make him aware of recommendations. (see result note)

## 2018-10-07 NOTE — Telephone Encounter (Signed)
-----   Message from Alford Highland, NP sent at 10/03/2018 11:18 AM EDT ----- BNP elevated. Weight was still up earlier this week when Zack saw him. Please have him take lasix 40 mg daily x 3 days. He can take PRN weight gain/SOB. Renal function stable.

## 2018-10-07 NOTE — Telephone Encounter (Signed)
I called Ms Jeremy Powers to schedule an appointment with Mr Jeremy Powers. She stated they would be home all day so I could come by anytime. I advised I would be by around 13:30 and she was agreeable.

## 2018-10-08 ENCOUNTER — Other Ambulatory Visit (HOSPITAL_COMMUNITY): Payer: Self-pay

## 2018-10-08 NOTE — Progress Notes (Addendum)
Paramedicine Encounter    Patient ID: Jeremy Powers, male    DOB: [birth year], 63 y.o.   MRN: [id number]   Patient Care Team: [facility] as PCP - General (Pediatrics)  Patient Active Problem List   Diagnosis Date Noted  . Acute systolic (congestive) heart failure (HCC) 06/24/2018  . Aortic insufficiency 06/22/2018  . Acute on chronic congestive heart failure (HCC) 06/22/2018  . Elevated troponin 06/22/2018  . CAD (coronary artery disease) 06/22/2018  . Left bundle branch block 06/22/2018  . History of iron deficiency anemia 06/22/2018  . Acute on chronic heart failure (HCC) 06/22/2018    Current Outpatient Medications:  .  albuterol (PROVENTIL HFA;VENTOLIN HFA) 108 (90 Base) MCG/ACT inhaler, Inhale 2 puffs into the lungs every 6 (six) hours as needed for wheezing or shortness of breath., Disp: 1 Inhaler, Rfl: 2 .  aspirin EC 81 MG tablet, Take 81 mg by mouth daily., Disp: , Rfl:  .  atorvastatin (LIPITOR) 40 MG tablet, Take 1 tablet (40 mg total) by mouth daily at 6 PM., Disp: 30 tablet, Rfl: 5 .  carvedilol (COREG) 3.125 MG tablet, Take 1 tablet (3.125 mg total) by mouth 2 (two) times daily for 30 days., Disp: 60 tablet, Rfl: 3 .  ferrous sulfate 325 (65 FE) MG tablet, Take 325 mg by mouth 2 (two) times daily with a meal., Disp: , Rfl:  .  furosemide (LASIX) 40 MG tablet, Take 1 tablet (40 mg total) by mouth as needed. Take as directed by the [facility], Disp: 30 tablet, Rfl: 3 .  losartan (COZAAR) 25 MG tablet, Take 1 tablet (25 mg total) by mouth at bedtime., Disp: 30 tablet, Rfl: 5 .  spironolactone (ALDACTONE) 25 MG tablet, Take 0.5 tablets (12.5 mg total) by mouth at bedtime., Disp: 15 tablet, Rfl: 5 No Known Allergies    Social History   Socioeconomic History  . Marital status: Single    Spouse name: Not on file  . Number of children: Not on file  . Years of education: Not on file  . Highest education level: Not on file   Occupational History  . Not on file  Social Needs  . Financial resource strain: Not on file  . Food insecurity:    Worry: Not on file    Inability: Not on file  . Transportation needs:    Medical: Not on file    Non-medical: Not on file  Tobacco Use  . Smoking status: Current Every Day Smoker    Packs/day: 0.50    Types: Cigarettes  . Smokeless tobacco: Never Used  Substance and Sexual Activity  . Alcohol use: Yes    Alcohol/week: 2.0 standard drinks    Types: 2 Cans of beer per week    Comment: 2 40oz beer daily  . Drug use: Yes    Types: Marijuana  . Sexual activity: Not on file  Lifestyle  . Physical activity:    Days per week: Not on file    Minutes per session: Not on file  . Stress: Not on file  Relationships  . Social connections:    Talks on phone: Not on file    Gets together: Not on file    Attends religious service: Not on file    Active member of club or organization: Not on file    Attends meetings of clubs or organizations: Not on file    Relationship status: Not on file  . Intimate partner violence:  Fear of current or ex partner: Not on file    Emotionally abused: Not on file    Physically abused: Not on file    Forced sexual activity: Not on file  Other Topics Concern  . Not on file  Social History Narrative  . Not on file    Physical Exam Cardiovascular:     Rate and Rhythm: Normal rate and regular rhythm.     Pulses: Normal pulses.  Pulmonary:     Effort: Pulmonary effort is normal.     Breath sounds: Normal breath sounds.  Abdominal:     General: Abdomen is flat.  Musculoskeletal: Normal range of motion.     Right lower leg: No edema.     Left lower leg: No edema.  Skin:    General: Skin is warm.     Capillary Refill: Capillary refill takes less than 2 seconds.  Neurological:     Mental Status: He is alert and oriented to person, place, and time.  Psychiatric:        Mood and Affect: Mood normal.         No future  appointments.  BP (!) 144/79 (BP Location: Left Arm, Patient Position: Sitting, Cuff Size: Normal)   Pulse 78   Resp 18   Wt 134 lb 3.2 oz (60.9 kg)   SpO2 99%   BMI 21.66 kg/m   Weight yesterday- 136.8 lb Last visit weight- 139 lb  Jeremy Powers was seen at home today and reported feeling well. He denied chesat pain, SOB, headache, dizziness, orthopnea, cough or fever since our last visit. He has not been compliant with his medications, missing 5 days out of the past 7. He stated that he took the medications a couple days but then got sick after eating chicken and did not feel well enough to take them. I explained the importance of taking his medications even when he is feeling unwell and he said he understood. Literacy is a barrier for him so today I drew a diagram for him to follow while taking medications out of his pillbox. We went over the diagram multiple times and he was finally able to tell me how to take his medications over the next week. I advised that if he has any questions then he should refer to the diagram and if he still is unsure, he should call me. He was understanding and agreeable. His medications were verified and his pillbox was refilled and included lasix for the next 3 days per clinic orders. I will follow up next week.   Jacqualine Code, EMT 10/08/18  ACTION: Home visit completed Next visit planned for 1 week

## 2018-10-15 ENCOUNTER — Other Ambulatory Visit (HOSPITAL_COMMUNITY): Payer: Self-pay

## 2018-10-15 NOTE — Progress Notes (Signed)
Paramedicine Encounter    Patient ID: Jeremy Powers, male    DOB: [birth year], 63 y.o.   MRN: [id number]   Patient Care Team: [facility] as PCP - General (Pediatrics)  Patient Active Problem List   Diagnosis Date Noted  . Acute systolic (congestive) heart failure (HCC) 06/24/2018  . Aortic insufficiency 06/22/2018  . Acute on chronic congestive heart failure (HCC) 06/22/2018  . Elevated troponin 06/22/2018  . CAD (coronary artery disease) 06/22/2018  . Left bundle branch block 06/22/2018  . History of iron deficiency anemia 06/22/2018  . Acute on chronic heart failure (HCC) 06/22/2018    Current Outpatient Medications:  .  albuterol (PROVENTIL HFA;VENTOLIN HFA) 108 (90 Base) MCG/ACT inhaler, Inhale 2 puffs into the lungs every 6 (six) hours as needed for wheezing or shortness of breath., Disp: 1 Inhaler, Rfl: 2 .  aspirin EC 81 MG tablet, Take 81 mg by mouth daily., Disp: , Rfl:  .  atorvastatin (LIPITOR) 40 MG tablet, Take 1 tablet (40 mg total) by mouth daily at 6 PM., Disp: 30 tablet, Rfl: 5 .  carvedilol (COREG) 3.125 MG tablet, Take 1 tablet (3.125 mg total) by mouth 2 (two) times daily for 30 days., Disp: 60 tablet, Rfl: 3 .  ferrous sulfate 325 (65 FE) MG tablet, Take 325 mg by mouth 2 (two) times daily with a meal., Disp: , Rfl:  .  furosemide (LASIX) 40 MG tablet, Take 1 tablet (40 mg total) by mouth as needed. Take as directed by the [facility], Disp: 30 tablet, Rfl: 3 .  losartan (COZAAR) 25 MG tablet, Take 1 tablet (25 mg total) by mouth at bedtime., Disp: 30 tablet, Rfl: 5 .  spironolactone (ALDACTONE) 25 MG tablet, Take 0.5 tablets (12.5 mg total) by mouth at bedtime., Disp: 15 tablet, Rfl: 5 No Known Allergies    Social History   Socioeconomic History  . Marital status: Single    Spouse name: Not on file  . Number of children: Not on file  . Years of education: Not on file  . Highest education level: Not on file   Occupational History  . Not on file  Social Needs  . Financial resource strain: Not on file  . Food insecurity:    Worry: Not on file    Inability: Not on file  . Transportation needs:    Medical: Not on file    Non-medical: Not on file  Tobacco Use  . Smoking status: Current Every Day Smoker    Packs/day: 0.50    Types: Cigarettes  . Smokeless tobacco: Never Used  Substance and Sexual Activity  . Alcohol use: Yes    Alcohol/week: 2.0 standard drinks    Types: 2 Cans of beer per week    Comment: 2 40oz beer daily  . Drug use: Yes    Types: Marijuana  . Sexual activity: Not on file  Lifestyle  . Physical activity:    Days per week: Not on file    Minutes per session: Not on file  . Stress: Not on file  Relationships  . Social connections:    Talks on phone: Not on file    Gets together: Not on file    Attends religious service: Not on file    Active member of club or organization: Not on file    Attends meetings of clubs or organizations: Not on file    Relationship status: Not on file  . Intimate partner violence:  Fear of current or ex partner: Not on file    Emotionally abused: Not on file    Physically abused: Not on file    Forced sexual activity: Not on file  Other Topics Concern  . Not on file  Social History Narrative  . Not on file    Physical Exam Cardiovascular:     Rate and Rhythm: Normal rate and regular rhythm.     Pulses: Normal pulses.  Pulmonary:     Effort: Pulmonary effort is normal.     Breath sounds: Examination of the left-lower field reveals wheezing. Wheezing present.  Musculoskeletal: Normal range of motion.     Right lower leg: No edema.     Left lower leg: No edema.  Skin:    General: Skin is warm and dry.     Capillary Refill: Capillary refill takes less than 2 seconds.  Neurological:     Mental Status: He is alert and oriented to person, place, and time.  Psychiatric:        Mood and Affect: Mood normal.         No  future appointments.  BP 134/65 (BP Location: Right Arm, Patient Position: Sitting, Cuff Size: Normal)   Pulse 70   Resp 16   Wt 139 lb 9.6 oz (63.3 kg)   SpO2 99%   BMI 22.53 kg/m   Weight yesterday- 138.6 lb Last visit weight- 134  lb  [name] was seen at home today and reported feeling well. He denied chest pain, SOB, headache, dizziness, orthopnea, cough or fever since our last visit. He stated he has been compliant with his medications which appeared to be true given an empty pillbox, however his weight has increased 5 pounds since our last visit. He stated he has been drinking more than he should and eating a lot of rice. I asked what he is adding to the rice and he advised that they cook the rice with 3 sticks of butter and then he adds salt. I explained the issues with this type of diet and reiterated his fluid restrictions and he expressed understanding. I added lasix to his pillbox for the next three days and advised I would check in over the next few days to see how his weight is doing.   Jacqualine Code, EMT 10/15/18  ACTION: Home visit completed Next visit planned for 1 week

## 2018-10-21 ENCOUNTER — Telehealth (HOSPITAL_COMMUNITY): Payer: Self-pay

## 2018-10-21 NOTE — Telephone Encounter (Signed)
I called Jeremy Powers to schedule an appointment for tomorrow. He stated he would be available in the morning so we agreed to meet at 10:00.

## 2018-10-22 ENCOUNTER — Other Ambulatory Visit (HOSPITAL_COMMUNITY): Payer: Self-pay

## 2018-10-22 NOTE — Progress Notes (Signed)
Paramedicine Encounter    Patient ID: Jeremy Powers, male    DOB: [birth year], 63 y.o.   MRN: [id number]   Patient Care Team: [facility] as PCP - General (Pediatrics)  Patient Active Problem List   Diagnosis Date Noted  . Acute systolic (congestive) heart failure (HCC) 06/24/2018  . Aortic insufficiency 06/22/2018  . Acute on chronic congestive heart failure (HCC) 06/22/2018  . Elevated troponin 06/22/2018  . CAD (coronary artery disease) 06/22/2018  . Left bundle branch block 06/22/2018  . History of iron deficiency anemia 06/22/2018  . Acute on chronic heart failure (HCC) 06/22/2018    Current Outpatient Medications:  .  albuterol (PROVENTIL HFA;VENTOLIN HFA) 108 (90 Base) MCG/ACT inhaler, Inhale 2 puffs into the lungs every 6 (six) hours as needed for wheezing or shortness of breath., Disp: 1 Inhaler, Rfl: 2 .  aspirin EC 81 MG tablet, Take 81 mg by mouth daily., Disp: , Rfl:  .  atorvastatin (LIPITOR) 40 MG tablet, Take 1 tablet (40 mg total) by mouth daily at 6 PM., Disp: 30 tablet, Rfl: 5 .  carvedilol (COREG) 3.125 MG tablet, Take 1 tablet (3.125 mg total) by mouth 2 (two) times daily for 30 days., Disp: 60 tablet, Rfl: 3 .  ferrous sulfate 325 (65 FE) MG tablet, Take 325 mg by mouth 2 (two) times daily with a meal., Disp: , Rfl:  .  furosemide (LASIX) 40 MG tablet, Take 1 tablet (40 mg total) by mouth as needed. Take as directed by the [facility], Disp: 30 tablet, Rfl: 3 .  losartan (COZAAR) 25 MG tablet, Take 1 tablet (25 mg total) by mouth at bedtime., Disp: 30 tablet, Rfl: 5 .  spironolactone (ALDACTONE) 25 MG tablet, Take 0.5 tablets (12.5 mg total) by mouth at bedtime., Disp: 15 tablet, Rfl: 5 No Known Allergies    Social History   Socioeconomic History  . Marital status: Single    Spouse name: Not on file  . Number of children: Not on file  . Years of education: Not on file  . Highest education level: Not on file   Occupational History  . Not on file  Social Needs  . Financial resource strain: Not on file  . Food insecurity:    Worry: Not on file    Inability: Not on file  . Transportation needs:    Medical: Not on file    Non-medical: Not on file  Tobacco Use  . Smoking status: Current Every Day Smoker    Packs/day: 0.50    Types: Cigarettes  . Smokeless tobacco: Never Used  Substance and Sexual Activity  . Alcohol use: Yes    Alcohol/week: 2.0 standard drinks    Types: 2 Cans of beer per week    Comment: 2 40oz beer daily  . Drug use: Yes    Types: Marijuana  . Sexual activity: Not on file  Lifestyle  . Physical activity:    Days per week: Not on file    Minutes per session: Not on file  . Stress: Not on file  Relationships  . Social connections:    Talks on phone: Not on file    Gets together: Not on file    Attends religious service: Not on file    Active member of club or organization: Not on file    Attends meetings of clubs or organizations: Not on file    Relationship status: Not on file  . Intimate partner violence:  Fear of current or ex partner: Not on file    Emotionally abused: Not on file    Physically abused: Not on file    Forced sexual activity: Not on file  Other Topics Concern  . Not on file  Social History Narrative  . Not on file    Physical Exam Cardiovascular:     Rate and Rhythm: Normal rate and regular rhythm.     Pulses: Normal pulses.  Pulmonary:     Effort: Pulmonary effort is normal.     Breath sounds: Normal breath sounds.  Musculoskeletal: Normal range of motion.     Right lower leg: No edema.     Left lower leg: No edema.  Skin:    General: Skin is warm and dry.     Capillary Refill: Capillary refill takes less than 2 seconds.  Neurological:     Mental Status: He is alert and oriented to person, place, and time.  Psychiatric:        Mood and Affect: Mood normal.         No future appointments.  BP 118/60 (BP Location:  Right Arm, Patient Position: Sitting, Cuff Size: Normal)   Pulse 62   Resp 16   Wt 134 lb 12.8 oz (61.1 kg)   SpO2 98%   BMI 21.76 kg/m   Weight yesterday- 135.8 lb Last visit weight- 139.6 lb  Mr Rueff was seen at home today and reported feeling well. He denied chest pain,. SOB, headache, dizziness, orthopnea, cough or fever since our last visit. He stated he has been compliant with his medications over the past week and his weight has been stable. His medications were verified and his pillbox was refilled. I will follow up next week.   Jacqualine Code, EMT 10/22/18  ACTION: Home visit completed Next visit planned for 1 week

## 2018-10-28 ENCOUNTER — Telehealth (HOSPITAL_COMMUNITY): Payer: Self-pay

## 2018-10-28 NOTE — Telephone Encounter (Signed)
I called Mr Jeremy Powers to schedule and appointment for tomorrow. He stated he would be available all day so we agreed to meet at 12:30.

## 2018-10-31 ENCOUNTER — Other Ambulatory Visit (HOSPITAL_COMMUNITY): Payer: Self-pay

## 2018-10-31 NOTE — Progress Notes (Signed)
Paramedicine Encounter    Patient ID: Jeremy Powers, male    DOB: 1955-10-06, 63 y.o.   MRN: [id number]   Patient Care Team: Inc, [facility] as PCP - General (Pediatrics)  Patient Active Problem List   Diagnosis Date Noted  . Acute systolic (congestive) heart failure ([place]) 06/24/2018  . Aortic insufficiency 06/22/2018  . Acute on chronic congestive heart failure ([place]) 06/22/2018  . Elevated troponin 06/22/2018  . CAD (coronary artery disease) 06/22/2018  . Left bundle branch block 06/22/2018  . History of iron deficiency anemia 06/22/2018  . Acute on chronic heart failure ([place]) 06/22/2018    Current Outpatient Medications:  .  albuterol (PROVENTIL HFA;VENTOLIN HFA) 108 (90 Base) MCG/ACT inhaler, Inhale 2 puffs into the lungs every 6 (six) hours as needed for wheezing or shortness of breath., Disp: 1 Inhaler, Rfl: 2 .  atorvastatin (LIPITOR) 40 MG tablet, Take 1 tablet (40 mg total) by mouth daily at 6 PM., Disp: 30 tablet, Rfl: 5 .  carvedilol (COREG) 3.125 MG tablet, Take 1 tablet (3.125 mg total) by mouth 2 (two) times daily for 30 days., Disp: 60 tablet, Rfl: 3 .  ferrous sulfate 325 (65 FE) MG tablet, Take 325 mg by mouth 2 (two) times daily with a meal., Disp: , Rfl:  .  furosemide (LASIX) 40 MG tablet, Take 1 tablet (40 mg total) by mouth as needed. Take as directed by the [facility], Disp: 30 tablet, Rfl: 3 .  losartan (COZAAR) 25 MG tablet, Take 1 tablet (25 mg total) by mouth at bedtime., Disp: 30 tablet, Rfl: 5 .  spironolactone (ALDACTONE) 25 MG tablet, Take 0.5 tablets (12.5 mg total) by mouth at bedtime., Disp: 15 tablet, Rfl: 5 .  aspirin EC 81 MG tablet, Take 81 mg by mouth daily., Disp: , Rfl:  No Known Allergies    Social History   Socioeconomic History  . Marital status: Single    Spouse name: Not on file  . Number of children: Not on file  . Years of education: Not on file  . Highest education level: Not on file   Occupational History  . Not on file  Social Needs  . Financial resource strain: Not on file  . Food insecurity    Worry: Not on file    Inability: Not on file  . Transportation needs    Medical: Not on file    Non-medical: Not on file  Tobacco Use  . Smoking status: Current Every Day Smoker    Packs/day: 0.50    Types: Cigarettes  . Smokeless tobacco: Never Used  Substance and Sexual Activity  . Alcohol use: Yes    Alcohol/week: 2.0 standard drinks    Types: 2 Cans of beer per week    Comment: 2 40oz beer daily  . Drug use: Yes    Types: Marijuana  . Sexual activity: Not on file  Lifestyle  . Physical activity    Days per week: Not on file    Minutes per session: Not on file  . Stress: Not on file  Relationships  . Social Herbalist on phone: Not on file    Gets together: Not on file    Attends religious service: Not on file    Active member of club or organization: Not on file    Attends meetings of clubs or organizations: Not on file    Relationship status: Not on file  . Intimate partner violence  Fear of current or ex partner: Not on file    Emotionally abused: Not on file    Physically abused: Not on file    Forced sexual activity: Not on file  Other Topics Concern  . Not on file  Social History Narrative  . Not on file    Physical Exam Cardiovascular:     Rate and Rhythm: Normal rate and regular rhythm.     Pulses: Normal pulses.  Pulmonary:     Effort: Pulmonary effort is normal.     Breath sounds: Normal breath sounds.  Musculoskeletal:     Right lower leg: Edema present.     Left lower leg: Edema present.  Skin:    General: Skin is warm and dry.     Capillary Refill: Capillary refill takes less than 2 seconds.  Neurological:     Mental Status: He is alert and oriented to person, place, and time.  Psychiatric:        Mood and Affect: Mood normal.         No future appointments.  BP (!) 149/80 (BP Location: Left Arm,  Patient Position: Sitting, Cuff Size: Normal)   Pulse 74   Resp 16   Wt 136 lb (61.7 kg)   SpO2 99%   BMI 21.95 kg/m   Weight yesterday- 135 lb Last visit weight- 134 lb  Jeremy Powers was seen at home today and reported feeling well. He denied chest pain, SOB, headache, dizziness, orthopnea, cough or fever since our last visit. He stated he has been compliant with his medications over the past week and his weigh thas been stable. His medications were verified and his pillbox was refilled. He will need more spironolactone for our visit next week so it was ordered from the pharmacy. He was also advised to pick up 81 mg aspirin when he gets his other medicine. He was understanding and agreeable. I will follow up next week.   Jacqualine Code, EMT 10/31/18  ACTION: Home visit completed Next visit planned for 1 week

## 2018-11-05 ENCOUNTER — Other Ambulatory Visit (HOSPITAL_COMMUNITY): Payer: Self-pay

## 2018-11-05 NOTE — Progress Notes (Signed)
Paramedicine Encounter    Patient ID: Jeremy Powers, male    DOB: 07/21/1955, 63 y.o.   MRN: [id number]    Patient Care Team: [facility] as PCP - General (Pediatrics)  Patient Active Problem List   Diagnosis Date Noted  . Acute systolic (congestive) heart failure ([place]) 06/24/2018  . Aortic insufficiency 06/22/2018  . Acute on chronic congestive heart failure ([place]) 06/22/2018  . Elevated troponin 06/22/2018  . CAD (coronary artery disease) 06/22/2018  . Left bundle branch block 06/22/2018  . History of iron deficiency anemia 06/22/2018  . Acute on chronic heart failure ([place]) 06/22/2018    Current Outpatient Medications:  .  aspirin EC 81 MG tablet, Take 81 mg by mouth daily., Disp: , Rfl:  .  atorvastatin (LIPITOR) 40 MG tablet, Take 1 tablet (40 mg total) by mouth daily at 6 PM., Disp: 30 tablet, Rfl: 5 .  ferrous sulfate 325 (65 FE) MG tablet, Take 325 mg by mouth 2 (two) times daily with a meal., Disp: , Rfl:  .  furosemide (LASIX) 40 MG tablet, Take 1 tablet (40 mg total) by mouth as needed. Take as directed by the [facility], Disp: 30 tablet, Rfl: 3 .  losartan (COZAAR) 25 MG tablet, Take 1 tablet (25 mg total) by mouth at bedtime., Disp: 30 tablet, Rfl: 5 .  spironolactone (ALDACTONE) 25 MG tablet, Take 0.5 tablets (12.5 mg total) by mouth at bedtime., Disp: 15 tablet, Rfl: 5 .  albuterol (PROVENTIL HFA;VENTOLIN HFA) 108 (90 Base) MCG/ACT inhaler, Inhale 2 puffs into the lungs every 6 (six) hours as needed for wheezing or shortness of breath., Disp: 1 Inhaler, Rfl: 2 .  carvedilol (COREG) 3.125 MG tablet, Take 1 tablet (3.125 mg total) by mouth 2 (two) times daily for 30 days., Disp: 60 tablet, Rfl: 3 No Known Allergies   Social History   Socioeconomic History  . Marital status: Single    Spouse name: Not on file  . Number of children: Not on file  . Years of education: Not on file  . Highest education level: Not on file   Occupational History  . Not on file  Social Needs  . Financial resource strain: Not on file  . Food insecurity    Worry: Not on file    Inability: Not on file  . Transportation needs    Medical: Not on file    Non-medical: Not on file  Tobacco Use  . Smoking status: Current Every Day Smoker    Packs/day: 0.50    Types: Cigarettes  . Smokeless tobacco: Never Used  Substance and Sexual Activity  . Alcohol use: Yes    Alcohol/week: 2.0 standard drinks    Types: 2 Cans of beer per week    Comment: 2 40oz beer daily  . Drug use: Yes    Types: Marijuana  . Sexual activity: Not on file  Lifestyle  . Physical activity    Days per week: Not on file    Minutes per session: Not on file  . Stress: Not on file  Relationships  . Social Herbalist on phone: Not on file    Gets together: Not on file    Attends religious service: Not on file    Active member of club or organization: Not on file    Attends meetings of clubs or organizations: Not on file    Relationship status: Not on file  . Intimate partner violence  Fear of current or ex partner: Not on file    Emotionally abused: Not on file    Physically abused: Not on file    Forced sexual activity: Not on file  Other Topics Concern  . Not on file  Social History Narrative  . Not on file    Physical Exam      No future appointments.   BP 136/80 (BP Location: Left Arm, Patient Position: Sitting)   Pulse 84   Temp (!) 97.4 F (36.3 C)   Wt 138 lb 3.2 oz (62.7 kg)   SpO2 98%   BMI 22.31 kg/m   Weight yesterday-138.4 Last visit weight-136  ATF pt CAO x4 sitting in the living room watching tv.  He stated that he feels good and has taken his medications this week.  Pt stated that he wasn't able to pick up the spirolactone because he couldn't afford it.  It was unclear when he would be able to get it.  Pt denies sob, chest pain and dizziness.  He also denies using additional pillows to sleep. He tries to  be able.  Rx bottles verified and pill box refilled.     Medication ordered: Spirolactone completely filled   Jermisha Hoffart, EMT Paramedic [phone] 11/07/2018    ACTION: Home visit completed

## 2018-11-12 ENCOUNTER — Telehealth (HOSPITAL_COMMUNITY): Payer: Self-pay

## 2018-11-12 ENCOUNTER — Other Ambulatory Visit (HOSPITAL_COMMUNITY): Payer: Self-pay

## 2018-11-12 NOTE — Telephone Encounter (Signed)
Spoke to Mr. Riddles who confirmed appointment for 1200.   Called Mr. Galik to advise him medication was filled and ready for pick up cost is $3 for Spironolactone.

## 2018-11-12 NOTE — Progress Notes (Signed)
Paramedicine Encounter    Patient ID: Jeremy Powers, male    DOB: January 17, 1956, 63 y.o.   MRN: [id number]   Patient Care Team: [facility] as PCP - General (Pediatrics)  Patient Active Problem List   Diagnosis Date Noted  . Acute systolic (congestive) heart failure ([facility]) 06/24/2018  . Aortic insufficiency 06/22/2018  . Acute on chronic congestive heart failure ([facility]) 06/22/2018  . Elevated troponin 06/22/2018  . CAD (coronary artery disease) 06/22/2018  . Left bundle branch block 06/22/2018  . History of iron deficiency anemia 06/22/2018  . Acute on chronic heart failure ([facility]) 06/22/2018    Current Outpatient Medications:  .  albuterol (PROVENTIL HFA;VENTOLIN HFA) 108 (90 Base) MCG/ACT inhaler, Inhale 2 puffs into the lungs every 6 (six) hours as needed for wheezing or shortness of breath., Disp: 1 Inhaler, Rfl: 2 .  aspirin EC 81 MG tablet, Take 81 mg by mouth daily., Disp: , Rfl:  .  atorvastatin (LIPITOR) 40 MG tablet, Take 1 tablet (40 mg total) by mouth daily at 6 PM., Disp: 30 tablet, Rfl: 5 .  ferrous sulfate 325 (65 FE) MG tablet, Take 325 mg by mouth 2 (two) times daily with a meal., Disp: , Rfl:  .  furosemide (LASIX) 40 MG tablet, Take 1 tablet (40 mg total) by mouth as needed. Take as directed by the [facility], Disp: 30 tablet, Rfl: 3 .  losartan (COZAAR) 25 MG tablet, Take 1 tablet (25 mg total) by mouth at bedtime., Disp: 30 tablet, Rfl: 5 .  spironolactone (ALDACTONE) 25 MG tablet, Take 0.5 tablets (12.5 mg total) by mouth at bedtime., Disp: 15 tablet, Rfl: 5 .  carvedilol (COREG) 3.125 MG tablet, Take 1 tablet (3.125 mg total) by mouth 2 (two) times daily for 30 days., Disp: 60 tablet, Rfl: 3 No Known Allergies   Social History   Socioeconomic History  . Marital status: Single    Spouse name: Not on file  . Number of children: Not on file  . Years of education: Not on file  . Highest education level: Not on file  Occupational  History  . Not on file  Social Needs  . Financial resource strain: Not on file  . Food insecurity    Worry: Not on file    Inability: Not on file  . Transportation needs    Medical: Not on file    Non-medical: Not on file  Tobacco Use  . Smoking status: Current Every Day Smoker    Packs/day: 0.50    Types: Cigarettes  . Smokeless tobacco: Never Used  Substance and Sexual Activity  . Alcohol use: Yes    Alcohol/week: 2.0 standard drinks    Types: 2 Cans of beer per week    Comment: 2 40oz beer daily  . Drug use: Yes    Types: Marijuana  . Sexual activity: Not on file  Lifestyle  . Physical activity    Days per week: Not on file    Minutes per session: Not on file  . Stress: Not on file  Relationships  . Social Musician on phone: Not on file    Gets together: Not on file    Attends religious service: Not on file    Active member of club or organization: Not on file    Attends meetings of clubs or organizations: Not on file    Relationship status: Not on file  . Intimate partner violence  Fear of current or ex partner: Not on file    Emotionally abused: Not on file    Physically abused: Not on file    Forced sexual activity: Not on file  Other Topics Concern  . Not on file  Social History Narrative  . Not on file    Physical Exam Vitals signs reviewed.  HENT:     Head: Normocephalic.     Nose: Nose normal.  Eyes:     Pupils: Pupils are equal, round, and reactive to light.  Neck:     Musculoskeletal: Normal range of motion.  Cardiovascular:     Rate and Rhythm: Normal rate and regular rhythm.     Pulses: Normal pulses.     Heart sounds: Normal heart sounds.  Pulmonary:     Effort: Pulmonary effort is normal.     Breath sounds: Normal breath sounds.  Abdominal:     General: Abdomen is flat.     Palpations: Abdomen is soft.  Musculoskeletal: Normal range of motion.     Right lower leg: No edema.     Left lower leg: No edema.  Skin:     General: Skin is warm and dry.     Capillary Refill: Capillary refill takes less than 2 seconds.  Neurological:     General: No focal deficit present.     Mental Status: He is alert.  Psychiatric:        Mood and Affect: Mood normal.     Arrived on scene for home visit for [name] who ambulated outside to greet me, [name] stated he was feeling good and had no pain, no shortness of breath, no nausea, no urinary changes, no other complaints. [name] walked inside and seated himself on the couch. Medications were reviewed and verified. Pill box was filled accordingly. Vitals were then taken and are as recorded. [name] agreed to home visit next week. He requested I call [facility] on [address] to check on his medication status and cost. I agreed to do same. Home visit complete.    Weight: 136lbs   Medications Ordered: NONE     No future appointments.   ACTION: Home visit completed Next visit planned for Next Week

## 2018-11-19 ENCOUNTER — Other Ambulatory Visit (HOSPITAL_COMMUNITY): Payer: Self-pay

## 2018-11-19 NOTE — Progress Notes (Signed)
Paramedicine Encounter    Patient ID: Jeremy Powers, male    DOB: October 24, 1955, 63 y.o.   MRN: [id number]   Patient Care Team: [facility] as PCP - General (Pediatrics)  Patient Active Problem List   Diagnosis Date Noted  . Acute systolic (congestive) heart failure ([facility]) 06/24/2018  . Aortic insufficiency 06/22/2018  . Acute on chronic congestive heart failure ([facility]) 06/22/2018  . Elevated troponin 06/22/2018  . CAD (coronary artery disease) 06/22/2018  . Left bundle branch block 06/22/2018  . History of iron deficiency anemia 06/22/2018  . Acute on chronic heart failure ([facility]) 06/22/2018    Current Outpatient Medications:  .  albuterol (PROVENTIL HFA;VENTOLIN HFA) 108 (90 Base) MCG/ACT inhaler, Inhale 2 puffs into the lungs every 6 (six) hours as needed for wheezing or shortness of breath., Disp: 1 Inhaler, Rfl: 2 .  aspirin EC 81 MG tablet, Take 81 mg by mouth daily., Disp: , Rfl:  .  atorvastatin (LIPITOR) 40 MG tablet, Take 1 tablet (40 mg total) by mouth daily at 6 PM., Disp: 30 tablet, Rfl: 5 .  carvedilol (COREG) 3.125 MG tablet, Take 1 tablet (3.125 mg total) by mouth 2 (two) times daily for 30 days., Disp: 60 tablet, Rfl: 3 .  ferrous sulfate 325 (65 FE) MG tablet, Take 325 mg by mouth 2 (two) times daily with a meal., Disp: , Rfl:  .  furosemide (LASIX) 40 MG tablet, Take 1 tablet (40 mg total) by mouth as needed. Take as directed by the [facility], Disp: 30 tablet, Rfl: 3 .  losartan (COZAAR) 25 MG tablet, Take 1 tablet (25 mg total) by mouth at bedtime., Disp: 30 tablet, Rfl: 5 .  spironolactone (ALDACTONE) 25 MG tablet, Take 0.5 tablets (12.5 mg total) by mouth at bedtime., Disp: 15 tablet, Rfl: 5 No Known Allergies    Social History   Socioeconomic History  . Marital status: Single    Spouse name: Not on file  . Number of children: Not on file  . Years of education: Not on file  . Highest education level: Not on file   Occupational History  . Not on file  Social Needs  . Financial resource strain: Not on file  . Food insecurity    Worry: Not on file    Inability: Not on file  . Transportation needs    Medical: Not on file    Non-medical: Not on file  Tobacco Use  . Smoking status: Current Every Day Smoker    Packs/day: 0.50    Types: Cigarettes  . Smokeless tobacco: Never Used  Substance and Sexual Activity  . Alcohol use: Yes    Alcohol/week: 2.0 standard drinks    Types: 2 Cans of beer per week    Comment: 2 40oz beer daily  . Drug use: Yes    Types: Marijuana  . Sexual activity: Not on file  Lifestyle  . Physical activity    Days per week: Not on file    Minutes per session: Not on file  . Stress: Not on file  Relationships  . Social Musician on phone: Not on file    Gets together: Not on file    Attends religious service: Not on file    Active member of club or organization: Not on file    Attends meetings of clubs or organizations: Not on file    Relationship status: Not on file  . Intimate partner violence  Fear of current or ex partner: Not on file    Emotionally abused: Not on file    Physically abused: Not on file    Forced sexual activity: Not on file  Other Topics Concern  . Not on file  Social History Narrative  . Not on file    Physical Exam Cardiovascular:     Rate and Rhythm: Normal rate and regular rhythm.     Pulses: Normal pulses.  Pulmonary:     Effort: Pulmonary effort is normal.     Breath sounds: Normal breath sounds.  Musculoskeletal: Normal range of motion.     Right lower leg: No edema.     Left lower leg: No edema.  Skin:    General: Skin is warm and dry.     Capillary Refill: Capillary refill takes less than 2 seconds.  Neurological:     Mental Status: He is alert and oriented to person, place, and time.  Psychiatric:        Mood and Affect: Mood normal.         No future appointments.  BP 130/67 (BP Location: Left  Arm, Patient Position: Sitting, Cuff Size: Normal)   Pulse 70   Resp 16   Wt 129 lb 3.2 oz (58.6 kg)   SpO2 99%   BMI 20.85 kg/m   Weight yesterday- 131.6 lb Last visit weight- 136 lb  Ms Vore was seen at home today and reported feeling generally well. He denied chest pain, SOB, headache, dizziness, orthopnea, cough or fever since our last visit. He stated he has been compliant with his medications over the past week however he had missed several days according to his pillbox. I questioned him about this and he said he did not take those because he did not recognize one of the pills and it worried him that they could be wrong. I explained that the pill in question (losartan) was a different color than before due to a manufacturer change but was still safe to take. He was understanding and said he would get back on track now that he knew. His medications were verified and his pillbox was refilled. I will follow up next week.   [name], EMT 11/19/18  ACTION: Home visit completed Next visit planned for 1 week

## 2018-11-20 ENCOUNTER — Telehealth (HOSPITAL_COMMUNITY): Payer: Self-pay | Admitting: Licensed Clinical Social Worker

## 2018-11-20 NOTE — Telephone Encounter (Signed)
Pt enrolled in [employer] 3 month study through [facility]- patient's 3 month period has now ended so CSW spoke with pt to complete an exit interview regarding patient's experience:  1. Since being on [employer] have you noticed any changes in your health or the way you feel physically? Feels physically better Weight loss? Said he lost significant weight but can't tell me exact numbers Improvement in BP? unsure  2. What do you like about the program? Liked the way the food tasted and thought it was very convenient What would you like to change/add? No suggestions for change at this time thought everything was great.  3. How did you get food prior to [place]? a. Did you get food stamps? About $130/month b. Did someone help financially? Yes sometimes he needs to borrow money. c. Did you access food banks? no d. Do you anticipate any issues transitioning back to getting your own food after this program ends? no  4. Would you be interested in future food pilots? yes  5. Would you like a dietary consult? yes  6. Do you have internet access? Tablet/Smart Phone? No Internet at home.   Jorge Ny, LCSW Clinical Social Worker [facility] Desk#: [phone] Cell#: [phone]

## 2018-11-26 ENCOUNTER — Other Ambulatory Visit (HOSPITAL_COMMUNITY): Payer: Self-pay

## 2018-11-26 NOTE — Progress Notes (Signed)
Paramedicine Encounter    Patient ID: Jeremy Powers, male    DOB: 26-Feb-1956, 63 y.o.   MRN: [id number]   Patient Care Team: [facility] as PCP - General (Pediatrics)  Patient Active Problem List   Diagnosis Date Noted  . Acute systolic (congestive) heart failure ([place]) 06/24/2018  . Aortic insufficiency 06/22/2018  . Acute on chronic congestive heart failure ([place]) 06/22/2018  . Elevated troponin 06/22/2018  . CAD (coronary artery disease) 06/22/2018  . Left bundle branch block 06/22/2018  . History of iron deficiency anemia 06/22/2018  . Acute on chronic heart failure ([place]) 06/22/2018    Current Outpatient Medications:  .  albuterol (PROVENTIL HFA;VENTOLIN HFA) 108 (90 Base) MCG/ACT inhaler, Inhale 2 puffs into the lungs every 6 (six) hours as needed for wheezing or shortness of breath., Disp: 1 Inhaler, Rfl: 2 .  aspirin EC 81 MG tablet, Take 81 mg by mouth daily., Disp: , Rfl:  .  atorvastatin (LIPITOR) 40 MG tablet, Take 1 tablet (40 mg total) by mouth daily at 6 PM., Disp: 30 tablet, Rfl: 5 .  carvedilol (COREG) 3.125 MG tablet, Take 1 tablet (3.125 mg total) by mouth 2 (two) times daily for 30 days., Disp: 60 tablet, Rfl: 3 .  ferrous sulfate 325 (65 FE) MG tablet, Take 325 mg by mouth 2 (two) times daily with a meal., Disp: , Rfl:  .  furosemide (LASIX) 40 MG tablet, Take 1 tablet (40 mg total) by mouth as needed. Take as directed by the [facility], Disp: 30 tablet, Rfl: 3 .  losartan (COZAAR) 25 MG tablet, Take 1 tablet (25 mg total) by mouth at bedtime., Disp: 30 tablet, Rfl: 5 .  spironolactone (ALDACTONE) 25 MG tablet, Take 0.5 tablets (12.5 mg total) by mouth at bedtime. (Patient not taking: Reported on 11/26/2018), Disp: 15 tablet, Rfl: 5 No Known Allergies    Social History   Socioeconomic History  . Marital status: Single    Spouse name: Not on file  . Number of children: Not on file  . Years of education: Not on file  . Highest  education level: Not on file  Occupational History  . Not on file  Social Needs  . Financial resource strain: Not on file  . Food insecurity    Worry: Not on file    Inability: Not on file  . Transportation needs    Medical: Not on file    Non-medical: Not on file  Tobacco Use  . Smoking status: Current Every Day Smoker    Packs/day: 0.50    Types: Cigarettes  . Smokeless tobacco: Never Used  Substance and Sexual Activity  . Alcohol use: Yes    Alcohol/week: 2.0 standard drinks    Types: 2 Cans of beer per week    Comment: 2 40oz beer daily  . Drug use: Yes    Types: Marijuana  . Sexual activity: Not on file  Lifestyle  . Physical activity    Days per week: Not on file    Minutes per session: Not on file  . Stress: Not on file  Relationships  . Social Herbalist on phone: Not on file    Gets together: Not on file    Attends religious service: Not on file    Active member of club or organization: Not on file    Attends meetings of clubs or organizations: Not on file    Relationship status: Not on file  .  Intimate partner violence    Fear of current or ex partner: Not on file    Emotionally abused: Not on file    Physically abused: Not on file    Forced sexual activity: Not on file  Other Topics Concern  . Not on file  Social History Narrative  . Not on file    Physical Exam Cardiovascular:     Rate and Rhythm: Normal rate and regular rhythm.     Pulses: Normal pulses.  Pulmonary:     Effort: Pulmonary effort is normal.     Breath sounds: Normal breath sounds.  Musculoskeletal: Normal range of motion.     Right lower leg: No edema.     Left lower leg: No edema.  Skin:    General: Skin is warm and dry.     Capillary Refill: Capillary refill takes less than 2 seconds.  Neurological:     Mental Status: He is alert and oriented to person, place, and time.  Psychiatric:        Mood and Affect: Mood normal.         No future  appointments.  BP (!) 149/79 (BP Location: Left Arm, Patient Position: Sitting, Cuff Size: Normal)   Pulse 80   Resp 16   Wt 131 lb 6.4 oz (59.6 kg)   SpO2 100%   BMI 21.21 kg/m   Weight yesterday- 129.2 lb Last visit weight- 129 lb  Jeremy Powers was seen at home today and reported feeling well. He denied chest pain, SOB, headache, dizziness, orthopnea, cough or fever over the past week and stated his weight has been stable over the past week. He missed 3 evening doses and had not taken morning medications yet and had not picked up spironolactone. I contacted the pharmacy and had them fill the spironolactone since it had been restocked since I ordered it last. His medications were verified and his pillbox was refilled. I will follow up next week.   Jacqualine Code, EMT 11/26/18  ACTION: Home visit completed Next visit planned for 1 week

## 2018-12-03 ENCOUNTER — Other Ambulatory Visit (HOSPITAL_COMMUNITY): Payer: Self-pay

## 2018-12-03 NOTE — Progress Notes (Signed)
Paramedicine Encounter    Patient ID: Jeremy Powers, male    DOB: 1956/01/02, 63 y.o.   MRN: [id number]   Patient Care Team: [facility] as PCP - General (Pediatrics)  Patient Active Problem List   Diagnosis Date Noted  . Acute systolic (congestive) heart failure ([facility]) 06/24/2018  . Aortic insufficiency 06/22/2018  . Acute on chronic congestive heart failure ([facility]) 06/22/2018  . Elevated troponin 06/22/2018  . CAD (coronary artery disease) 06/22/2018  . Left bundle branch block 06/22/2018  . History of iron deficiency anemia 06/22/2018  . Acute on chronic heart failure ([facility]) 06/22/2018    Current Outpatient Medications:  .  albuterol (PROVENTIL HFA;VENTOLIN HFA) 108 (90 Base) MCG/ACT inhaler, Inhale 2 puffs into the lungs every 6 (six) hours as needed for wheezing or shortness of breath., Disp: 1 Inhaler, Rfl: 2 .  aspirin EC 81 MG tablet, Take 81 mg by mouth daily., Disp: , Rfl:  .  atorvastatin (LIPITOR) 40 MG tablet, Take 1 tablet (40 mg total) by mouth daily at 6 PM., Disp: 30 tablet, Rfl: 5 .  carvedilol (COREG) 3.125 MG tablet, Take 1 tablet (3.125 mg total) by mouth 2 (two) times daily for 30 days., Disp: 60 tablet, Rfl: 3 .  ferrous sulfate 325 (65 FE) MG tablet, Take 325 mg by mouth 2 (two) times daily with a meal., Disp: , Rfl:  .  furosemide (LASIX) 40 MG tablet, Take 1 tablet (40 mg total) by mouth as needed. Take as directed by the [facility], Disp: 30 tablet, Rfl: 3 .  losartan (COZAAR) 25 MG tablet, Take 1 tablet (25 mg total) by mouth at bedtime., Disp: 30 tablet, Rfl: 5 .  spironolactone (ALDACTONE) 25 MG tablet, Take 0.5 tablets (12.5 mg total) by mouth at bedtime., Disp: 15 tablet, Rfl: 5 No Known Allergies    Social History   Socioeconomic History  . Marital status: Single    Spouse name: Not on file  . Number of children: Not on file  . Years of education: Not on file  . Highest education level: Not on file   Occupational History  . Not on file  Social Needs  . Financial resource strain: Not on file  . Food insecurity    Worry: Not on file    Inability: Not on file  . Transportation needs    Medical: Not on file    Non-medical: Not on file  Tobacco Use  . Smoking status: Current Every Day Smoker    Packs/day: 0.50    Types: Cigarettes  . Smokeless tobacco: Never Used  Substance and Sexual Activity  . Alcohol use: Yes    Alcohol/week: 2.0 standard drinks    Types: 2 Cans of beer per week    Comment: 2 40oz beer daily  . Drug use: Yes    Types: Marijuana  . Sexual activity: Not on file  Lifestyle  . Physical activity    Days per week: Not on file    Minutes per session: Not on file  . Stress: Not on file  Relationships  . Social Musician on phone: Not on file    Gets together: Not on file    Attends religious service: Not on file    Active member of club or organization: Not on file    Attends meetings of clubs or organizations: Not on file    Relationship status: Not on file  . Intimate partner violence  Fear of current or ex partner: Not on file    Emotionally abused: Not on file    Physically abused: Not on file    Forced sexual activity: Not on file  Other Topics Concern  . Not on file  Social History Narrative  . Not on file    Physical Exam Cardiovascular:     Rate and Rhythm: Normal rate and regular rhythm.     Pulses: Normal pulses.  Pulmonary:     Effort: Pulmonary effort is normal.     Breath sounds: Normal breath sounds.  Abdominal:     General: There is no distension.  Musculoskeletal: Normal range of motion.     Right lower leg: No edema.     Left lower leg: No edema.  Skin:    General: Skin is warm and dry.     Capillary Refill: Capillary refill takes less than 2 seconds.  Neurological:     Mental Status: He is alert and oriented to person, place, and time.  Psychiatric:        Mood and Affect: Mood normal.         No  future appointments.  BP 132/77 (BP Location: Left Arm, Patient Position: Sitting, Cuff Size: Normal)   Pulse 78   Resp 16   Wt 135 lb 12.8 oz (61.6 kg)   SpO2 99%   BMI 21.92 kg/m   Weight yesterday- 133.6 lb Last visit weight- 131.4 lb  Mr Fenter was seen at home today and reported feeling well. He denied chest pain, SOB, headache, dizziness, orthopnea, cough or fever. He stated he has been compliant with his medications over the past week and his weight ht has been stable. His medications were verified and his pillbox was refilled. I will follow up next week.  [name], EMT 12/03/18  ACTION: Home visit completed Next visit planned for 1 week

## 2018-12-09 ENCOUNTER — Telehealth (HOSPITAL_COMMUNITY): Payer: Self-pay

## 2018-12-09 NOTE — Telephone Encounter (Signed)
I called Jeremy Powers to schedule an appointment. He stated anytime on Wednesday was fine so we agreed to meet at 11:00.

## 2018-12-10 ENCOUNTER — Other Ambulatory Visit (HOSPITAL_COMMUNITY): Payer: Self-pay

## 2018-12-10 ENCOUNTER — Telehealth (HOSPITAL_COMMUNITY): Payer: Self-pay | Admitting: Adult Health

## 2018-12-10 NOTE — Telephone Encounter (Signed)
   He reports increased SOB over the week. + PND + Orthonea.   I have asked Thedore Mins to instruct him to take lasix 40 mg for the next 2 days.   Thedore Mins will see him at his home next week.   We will set him up for HF follow up in the next 7-14 days. Plan to check [facility] /[facility] at that time.   Morganna Styles NP-C  12:51 PM

## 2018-12-10 NOTE — Progress Notes (Signed)
I spoke to Darrick Grinder, NP, regarding Mr Vangieson complaints of SOB in the evenings. She advised to have him take 40 mg of lasix for the next two days and someone would call him to set up an appointment. I returned to Mr Wandel' apartment to make the changes and while I was there, he asked that I call his other cardiologist and cancel that appointment. I will follow up next week.

## 2018-12-10 NOTE — Progress Notes (Signed)
Paramedicine Encounter    Patient ID: Jeremy Powers, male    DOB: 1955-10-06, 63 y.o.   MRN: [id number]   Patient Care Team: [facility] as PCP - General (Pediatrics)  Patient Active Problem List   Diagnosis Date Noted  . Acute systolic (congestive) heart failure ([place]) 06/24/2018  . Aortic insufficiency 06/22/2018  . Acute on chronic congestive heart failure ([place]) 06/22/2018  . Elevated troponin 06/22/2018  . CAD (coronary artery disease) 06/22/2018  . Left bundle branch block 06/22/2018  . History of iron deficiency anemia 06/22/2018  . Acute on chronic heart failure ([place]) 06/22/2018    Current Outpatient Medications:  .  albuterol (PROVENTIL HFA;VENTOLIN HFA) 108 (90 Base) MCG/ACT inhaler, Inhale 2 puffs into the lungs every 6 (six) hours as needed for wheezing or shortness of breath., Disp: 1 Inhaler, Rfl: 2 .  atorvastatin (LIPITOR) 40 MG tablet, Take 1 tablet (40 mg total) by mouth daily at 6 PM., Disp: 30 tablet, Rfl: 5 .  carvedilol (COREG) 3.125 MG tablet, Take 1 tablet (3.125 mg total) by mouth 2 (two) times daily for 30 days., Disp: 60 tablet, Rfl: 3 .  ferrous sulfate 325 (65 FE) MG tablet, Take 325 mg by mouth 2 (two) times daily with a meal., Disp: , Rfl:  .  furosemide (LASIX) 40 MG tablet, Take 1 tablet (40 mg total) by mouth as needed. Take as directed by the [facility], Disp: 30 tablet, Rfl: 3 .  losartan (COZAAR) 25 MG tablet, Take 1 tablet (25 mg total) by mouth at bedtime., Disp: 30 tablet, Rfl: 5 .  spironolactone (ALDACTONE) 25 MG tablet, Take 0.5 tablets (12.5 mg total) by mouth at bedtime., Disp: 15 tablet, Rfl: 5 .  aspirin EC 81 MG tablet, Take 81 mg by mouth daily., Disp: , Rfl:  No Known Allergies    Social History   Socioeconomic History  . Marital status: Single    Spouse name: Not on file  . Number of children: Not on file  . Years of education: Not on file  . Highest education level: Not on file   Occupational History  . Not on file  Social Needs  . Financial resource strain: Not on file  . Food insecurity    Worry: Not on file    Inability: Not on file  . Transportation needs    Medical: Not on file    Non-medical: Not on file  Tobacco Use  . Smoking status: Current Every Day Smoker    Packs/day: 0.50    Types: Cigarettes  . Smokeless tobacco: Never Used  Substance and Sexual Activity  . Alcohol use: Yes    Alcohol/week: 2.0 standard drinks    Types: 2 Cans of beer per week    Comment: 2 40oz beer daily  . Drug use: Yes    Types: Marijuana  . Sexual activity: Not on file  Lifestyle  . Physical activity    Days per week: Not on file    Minutes per session: Not on file  . Stress: Not on file  Relationships  . Social Herbalist on phone: Not on file    Gets together: Not on file    Attends religious service: Not on file    Active member of club or organization: Not on file    Attends meetings of clubs or organizations: Not on file    Relationship status: Not on file  . Intimate partner violence  Fear of current or ex partner: Not on file    Emotionally abused: Not on file    Physically abused: Not on file    Forced sexual activity: Not on file  Other Topics Concern  . Not on file  Social History Narrative  . Not on file    Physical Exam Cardiovascular:     Rate and Rhythm: Normal rate and regular rhythm.     Pulses: Normal pulses.  Pulmonary:     Effort: Pulmonary effort is normal.     Breath sounds: Examination of the right-lower field reveals wheezing. Wheezing present.  Musculoskeletal: Normal range of motion.     Right lower leg: Edema present.     Left lower leg: Edema present.  Skin:    General: Skin is warm and dry.     Capillary Refill: Capillary refill takes less than 2 seconds.  Neurological:     Mental Status: He is alert and oriented to person, place, and time.  Psychiatric:        Mood and Affect: Mood normal.          No future appointments.  BP (!) 141/81 (BP Location: Left Arm, Patient Position: Sitting, Cuff Size: Normal)   Pulse 75   Resp 16   Wt 135 lb 9.6 oz (61.5 kg)   SpO2 98%   BMI 21.89 kg/m   Weight yesterday- Did not weigh Last visit weight- 135.8 lb  Jeremy Powers was seen at home today and reported feeling well. He denied chest pain, current SOB, headache, dizziness, orthopnea, cough or fever. He did say he has been having SOB at night which is not relieved by his MDI and awakes him from a slumber. He stated this has been ongoing for about two weeks and only happens at night. He had missed several doses of his medications over the past week, mainly losartan and carvedilol. I pointed this out to him and he stated he must not have seen them since they are so little. He stated he would do better over the coming week. His medications were verified and his pillbox was refilled I will reach out to the clinic regarding his SOB and follow up with him after getting an answer.   [name], EMT 12/10/18  ACTION: Home visit completed Next visit planned for 1 week

## 2018-12-11 NOTE — Telephone Encounter (Signed)
appt sch 7/30 at 2 pm, Jeremy Powers is aware

## 2018-12-16 ENCOUNTER — Telehealth (HOSPITAL_COMMUNITY): Payer: Self-pay

## 2018-12-16 NOTE — Telephone Encounter (Signed)
I called Jeremy Powers to schedule an appointment. He stated he would not be home tomorrow and asked that I com on Thursday. We agreed to meet on Thursday at 09:00.

## 2018-12-18 ENCOUNTER — Encounter (HOSPITAL_COMMUNITY): Payer: Medicaid Other

## 2018-12-18 ENCOUNTER — Other Ambulatory Visit (HOSPITAL_COMMUNITY): Payer: Self-pay

## 2018-12-18 NOTE — Progress Notes (Signed)
Paramedicine Encounter    Patient ID: Jeremy Powers, male    DOB: 1955/09/01, 63 y.o.   MRN: [id number]   Patient Care Team: [facility] as PCP - General (Pediatrics)  Patient Active Problem List   Diagnosis Date Noted  . Acute systolic (congestive) heart failure ([facility]) 06/24/2018  . Aortic insufficiency 06/22/2018  . Acute on chronic congestive heart failure ([facility]) 06/22/2018  . Elevated troponin 06/22/2018  . CAD (coronary artery disease) 06/22/2018  . Left bundle branch block 06/22/2018  . History of iron deficiency anemia 06/22/2018  . Acute on chronic heart failure ([facility]) 06/22/2018    Current Outpatient Medications:  .  albuterol (PROVENTIL HFA;VENTOLIN HFA) 108 (90 Base) MCG/ACT inhaler, Inhale 2 puffs into the lungs every 6 (six) hours as needed for wheezing or shortness of breath., Disp: 1 Inhaler, Rfl: 2 .  aspirin EC 81 MG tablet, Take 81 mg by mouth daily., Disp: , Rfl:  .  atorvastatin (LIPITOR) 40 MG tablet, Take 1 tablet (40 mg total) by mouth daily at 6 PM., Disp: 30 tablet, Rfl: 5 .  carvedilol (COREG) 3.125 MG tablet, Take 1 tablet (3.125 mg total) by mouth 2 (two) times daily for 30 days., Disp: 60 tablet, Rfl: 3 .  ferrous sulfate 325 (65 FE) MG tablet, Take 325 mg by mouth 2 (two) times daily with a meal., Disp: , Rfl:  .  furosemide (LASIX) 40 MG tablet, Take 1 tablet (40 mg total) by mouth as needed. Take as directed by the [facility], Disp: 30 tablet, Rfl: 3 .  losartan (COZAAR) 25 MG tablet, Take 1 tablet (25 mg total) by mouth at bedtime., Disp: 30 tablet, Rfl: 5 .  spironolactone (ALDACTONE) 25 MG tablet, Take 0.5 tablets (12.5 mg total) by mouth at bedtime., Disp: 15 tablet, Rfl: 5 No Known Allergies    Social History   Socioeconomic History  . Marital status: Single    Spouse name: Not on file  . Number of children: Not on file  . Years of education: Not on file  . Highest education level: Not on file   Occupational History  . Not on file  Social Needs  . Financial resource strain: Not on file  . Food insecurity    Worry: Not on file    Inability: Not on file  . Transportation needs    Medical: Not on file    Non-medical: Not on file  Tobacco Use  . Smoking status: Current Every Day Smoker    Packs/day: 0.50    Types: Cigarettes  . Smokeless tobacco: Never Used  Substance and Sexual Activity  . Alcohol use: Yes    Alcohol/week: 2.0 standard drinks    Types: 2 Cans of beer per week    Comment: 2 40oz beer daily  . Drug use: Yes    Types: Marijuana  . Sexual activity: Not on file  Lifestyle  . Physical activity    Days per week: Not on file    Minutes per session: Not on file  . Stress: Not on file  Relationships  . Social Musician on phone: Not on file    Gets together: Not on file    Attends religious service: Not on file    Active member of club or organization: Not on file    Attends meetings of clubs or organizations: Not on file    Relationship status: Not on file  . Intimate partner violence  Fear of current or ex partner: Not on file    Emotionally abused: Not on file    Physically abused: Not on file    Forced sexual activity: Not on file  Other Topics Concern  . Not on file  Social History Narrative  . Not on file    Physical Exam Cardiovascular:     Rate and Rhythm: Normal rate and regular rhythm.     Pulses: Normal pulses.  Pulmonary:     Effort: Pulmonary effort is normal.     Breath sounds: Normal breath sounds.  Abdominal:     General: There is no distension.  Musculoskeletal: Normal range of motion.     Right lower leg: No edema.     Left lower leg: No edema.  Skin:    General: Skin is warm and dry.     Capillary Refill: Capillary refill takes less than 2 seconds.  Neurological:     Mental Status: He is alert and oriented to person, place, and time.  Psychiatric:        Mood and Affect: Mood normal.         Future  Appointments  Date Time Provider [place]  12/18/2018  2:00 PM [facility] PA/NP [facility] None    BP (!) 141/79 (BP Location: Left Arm, Patient Position: Sitting, Cuff Size: Normal)   Pulse 77   Resp 16   SpO2 99%   Weight yesterday- Did not weigh  Last visit weight- 135 lb  Jeremy Powers was seen at home today and reported feeling well. He denied chest pain, SOB, headache dizziness, orthopnea, cough or fever. He stated he has been staying at a hotel in [place] the past several days because his apartment was being cleaned. As a result he has not been checking his weight daily but he did reported being compliant with his medications. His medications were verified and his pillbox was refilled. Ferrous sulfate was needed in four of his morning slots due to running out and the pharmacy stated they would be able to ger it filled after hearing from his PCP. I will follow up next week.   [name], EMT 12/18/18  ACTION: Home visit completed Next visit planned for 1 week

## 2018-12-24 ENCOUNTER — Other Ambulatory Visit (HOSPITAL_COMMUNITY): Payer: Self-pay

## 2018-12-24 NOTE — Progress Notes (Signed)
Paramedicine Encounter    Patient ID: Jeremy Powers, male    DOB: 1955/06/12, 63 y.o.   MRN: [id number]   Patient Care Team: Inc, [facility] as PCP - General (Pediatrics)  Patient Active Problem List   Diagnosis Date Noted  . Acute systolic (congestive) heart failure (HCC) 06/24/2018  . Aortic insufficiency 06/22/2018  . Acute on chronic congestive heart failure (HCC) 06/22/2018  . Elevated troponin 06/22/2018  . CAD (coronary artery disease) 06/22/2018  . Left bundle branch block 06/22/2018  . History of iron deficiency anemia 06/22/2018  . Acute on chronic heart failure (HCC) 06/22/2018    Current Outpatient Medications:  .  albuterol (PROVENTIL HFA;VENTOLIN HFA) 108 (90 Base) MCG/ACT inhaler, Inhale 2 puffs into the lungs every 6 (six) hours as needed for wheezing or shortness of breath., Disp: 1 Inhaler, Rfl: 2 .  aspirin EC 81 MG tablet, Take 81 mg by mouth daily., Disp: , Rfl:  .  atorvastatin (LIPITOR) 40 MG tablet, Take 1 tablet (40 mg total) by mouth daily at 6 PM., Disp: 30 tablet, Rfl: 5 .  carvedilol (COREG) 3.125 MG tablet, Take 1 tablet (3.125 mg total) by mouth 2 (two) times daily for 30 days., Disp: 60 tablet, Rfl: 3 .  furosemide (LASIX) 40 MG tablet, Take 1 tablet (40 mg total) by mouth as needed. Take as directed by the [facility] Failure [facility], Disp: 30 tablet, Rfl: 3 .  losartan (COZAAR) 25 MG tablet, Take 1 tablet (25 mg total) by mouth at bedtime., Disp: 30 tablet, Rfl: 5 .  spironolactone (ALDACTONE) 25 MG tablet, Take 0.5 tablets (12.5 mg total) by mouth at bedtime., Disp: 15 tablet, Rfl: 5 .  ferrous sulfate 325 (65 FE) MG tablet, Take 325 mg by mouth 2 (two) times daily with a meal., Disp: , Rfl:  No Known Allergies    Social History   Socioeconomic History  . Marital status: Single    Spouse name: Not on file  . Number of children: Not on file  . Years of education: Not on file  . Highest education level: Not on file   Occupational History  . Not on file  Social Needs  . Financial resource strain: Not on file  . Food insecurity    Worry: Not on file    Inability: Not on file  . Transportation needs    Medical: Not on file    Non-medical: Not on file  Tobacco Use  . Smoking status: Current Every Day Smoker    Packs/day: 0.50    Types: Cigarettes  . Smokeless tobacco: Never Used  Substance and Sexual Activity  . Alcohol use: Yes    Alcohol/week: 2.0 standard drinks    Types: 2 Cans of beer per week    Comment: 2 40oz beer daily  . Drug use: Yes    Types: Marijuana  . Sexual activity: Not on file  Lifestyle  . Physical activity    Days per week: Not on file    Minutes per session: Not on file  . Stress: Not on file  Relationships  . Social Musician on phone: Not on file    Gets together: Not on file    Attends religious service: Not on file    Active member of club or organization: Not on file    Attends meetings of clubs or organizations: Not on file    Relationship status: Not on file  . Intimate partner violence  Fear of current or ex partner: Not on file    Emotionally abused: Not on file    Physically abused: Not on file    Forced sexual activity: Not on file  Other Topics Concern  . Not on file  Social History Narrative  . Not on file    Physical Exam Cardiovascular:     Rate and Rhythm: Normal rate and regular rhythm.     Pulses: Normal pulses.  Pulmonary:     Effort: Pulmonary effort is normal.     Breath sounds: Normal breath sounds.  Abdominal:     General: There is no distension.  Musculoskeletal: Normal range of motion.     Right lower leg: No edema.     Left lower leg: No edema.  Skin:    General: Skin is warm and dry.     Capillary Refill: Capillary refill takes less than 2 seconds.  Neurological:     Mental Status: He is alert and oriented to person, place, and time.  Psychiatric:        Mood and Affect: Mood normal.         Future  Appointments  Date Time Provider [place]  12/25/2018  2:00 PM [facility] PA/NP [facility] None    BP 134/72 (BP Location: Left Arm, Patient Position: Sitting, Cuff Size: Normal)   Pulse 70   Resp 16   Wt 136 lb (61.7 kg)   SpO2 99%   BMI 21.95 kg/m   Weight yesterday- 135.4 lb Last visit weight- 135 lb  Jeremy Powers was seen at home today and reported feeling well. He denied chest pain, SOB, headache, dizziness, orthopnea, cough or fever since our last visit. He stated he has been compliant with his medications over the past week and his weight has been stable. His medications were verified and his pillbox was refilled. I will follow up next week.   Jeremy Powers, EMT 12/24/18  ACTION: Home visit completed Next visit planned for 1 week

## 2018-12-25 ENCOUNTER — Ambulatory Visit (HOSPITAL_COMMUNITY)
Admission: RE | Admit: 2018-12-25 | Discharge: 2018-12-25 | Disposition: A | Discharge status: Home / Self Care | Payer: Medicare Other | Source: Ambulatory Visit | Attending: Internal Medicine | Admitting: Internal Medicine

## 2018-12-25 ENCOUNTER — Encounter (HOSPITAL_COMMUNITY): Payer: Self-pay

## 2018-12-25 ENCOUNTER — Other Ambulatory Visit: Payer: Self-pay

## 2018-12-25 VITALS — BP 140/86 | HR 84 | Wt 131.8 lb

## 2018-12-25 DIAGNOSIS — I110 Hypertensive heart disease with heart failure: Secondary | ICD-10-CM | POA: Insufficient documentation

## 2018-12-25 DIAGNOSIS — I5022 Chronic systolic (congestive) heart failure: Secondary | ICD-10-CM | POA: Diagnosis present

## 2018-12-25 DIAGNOSIS — I428 Other cardiomyopathies: Secondary | ICD-10-CM | POA: Insufficient documentation

## 2018-12-25 DIAGNOSIS — F17210 Nicotine dependence, cigarettes, uncomplicated: Secondary | ICD-10-CM | POA: Diagnosis not present

## 2018-12-25 DIAGNOSIS — Z955 Presence of coronary angioplasty implant and graft: Secondary | ICD-10-CM | POA: Insufficient documentation

## 2018-12-25 DIAGNOSIS — I25810 Atherosclerosis of coronary artery bypass graft(s) without angina pectoris: Secondary | ICD-10-CM | POA: Diagnosis not present

## 2018-12-25 DIAGNOSIS — Z951 Presence of aortocoronary bypass graft: Secondary | ICD-10-CM | POA: Diagnosis not present

## 2018-12-25 DIAGNOSIS — I252 Old myocardial infarction: Secondary | ICD-10-CM | POA: Diagnosis not present

## 2018-12-25 DIAGNOSIS — I10 Essential (primary) hypertension: Secondary | ICD-10-CM

## 2018-12-25 DIAGNOSIS — I447 Left bundle-branch block, unspecified: Secondary | ICD-10-CM | POA: Insufficient documentation

## 2018-12-25 DIAGNOSIS — Z9114 Patient's other noncompliance with medication regimen: Secondary | ICD-10-CM | POA: Insufficient documentation

## 2018-12-25 DIAGNOSIS — Z7982 Long term (current) use of aspirin: Secondary | ICD-10-CM | POA: Diagnosis not present

## 2018-12-25 DIAGNOSIS — Z79899 Other long term (current) drug therapy: Secondary | ICD-10-CM | POA: Insufficient documentation

## 2018-12-25 DIAGNOSIS — D509 Iron deficiency anemia, unspecified: Secondary | ICD-10-CM | POA: Diagnosis not present

## 2018-12-25 DIAGNOSIS — I2510 Atherosclerotic heart disease of native coronary artery without angina pectoris: Secondary | ICD-10-CM

## 2018-12-25 LAB — BASIC METABOLIC PANEL
Anion gap: 10 (ref 5–15)
BUN: 12 mg/dL (ref 8–23)
CO2: 22 mmol/L (ref 22–32)
Calcium: 8.7 mg/dL — ABNORMAL LOW (ref 8.9–10.3)
Chloride: 103 mmol/L (ref 98–111)
Creatinine, Ser: 0.9 mg/dL (ref 0.61–1.24)
GFR calc Af Amer: >60 mL/min (ref >60)
GFR calc non Af Amer: >60 mL/min (ref >60)
Glucose, Bld: 104 mg/dL — ABNORMAL HIGH (ref 70–99)
Potassium: 4.5 mmol/L (ref 3.5–5.1)
Sodium: 135 mmol/L (ref 135–145)

## 2018-12-25 MED ORDER — CARVEDILOL 6.25 MG PO TABS: 6.2500 mg | ORAL_TABLET | Freq: Two times a day (BID) | ORAL | 3 refills | Status: DC

## 2018-12-25 MED ORDER — ALBUTEROL SULFATE HFA 108 (90 BASE) MCG/ACT IN AERS: 2.0000 | INHALATION_SPRAY | Freq: Four times a day (QID) | RESPIRATORY_TRACT | 1 refills | Status: DC | PRN

## 2018-12-25 MED ORDER — SPIRONOLACTONE 25 MG PO TABS: 25.0000 mg | ORAL_TABLET | Freq: Every day | ORAL | 5 refills | Status: DC

## 2018-12-25 NOTE — Progress Notes (Signed)
PCP: Primary Cardiologist: Dr Haroldine Laws  HPI: Jeremy Powers is a 63 y.o. male with a history of CAD with NSTEMI, s/p PCI to LAD in 2015 and 2016, s/p CABG 2017, microcytic/iron deficiency anemia followed by hematology and GI, LBBB, and tobacco use.  He presented to [facility] ED with SOB, cough, and orthopnea in setting of med non-compliance. Was unable to afford copays with Medicaid. He was tachycardic and tachypneic on arrival, and was transferred to [facility]for further evaluation. Echo performed which showed newly decreased EF of 25-30% from 50-55% in 12/2017. Planned for LHC, but deferred due to AKI. CHF asked to see to optimize and follow. Taken for [facility] which showed severe 1 v disease with occluded LAD within previous stent. Other vessels stable and grafts patent as below. Cath also showed low filling pressures with moderately low cardiac output. Meds adjusted as tolerated.  Today he returns for HF follow up. Overall feeling fine. SOB after walking 1 block. Denies PND/Orthopnea. Appetite fair. Eating high sodium foods. No fever or chills. Weight at home pounds. Taking all medications. Continue HF Paramedicine. Smoking 2-3 cigarettes per day. Drinking 6 beers per week.   RHC/LHC 06/24/2018 RA = 1 RV = 19/3 PA = 24/5 (14) PCW = 7 Fick cardiac output/index = 4.0/2.5 PVR = 1.9 WU Ao sat = 95% PA sat = 68%, 71% Assessment: 1. Severe 1-vessel CAD with occluded LAD within previous stent 2. Normal RCA and LCX 3. LIMA to LAD widely patent 4. SVG - D2 widely patent 5. SVG - D1 likely occluded (grafts not marked)  ECHO 06/2018 EF 25-30% LBBB CM[facility] 06/27/18 EF 24%. Normal RV     ROS: All systems negative except as listed in HPI, PMH and Problem List.  SH:  Social History   Socioeconomic History  . Marital status: Single    Spouse name: Not on file  . Number of children: Not on file  . Years of education: Not on file  . Highest education level: Not on file  Occupational History   . Not on file  Social Needs  . Financial resource strain: Not on file  . Food insecurity    Worry: Not on file    Inability: Not on file  . Transportation needs    Medical: Not on file    Non-medical: Not on file  Tobacco Use  . Smoking status: Current Every Day Smoker    Packs/day: 0.50    Types: Cigarettes  . Smokeless tobacco: Never Used  Substance and Sexual Activity  . Alcohol use: Yes    Alcohol/week: 2.0 standard drinks    Types: 2 Cans of beer per week    Comment: 2 40oz beer daily  . Drug use: Yes    Types: Marijuana  . Sexual activity: Not on file  Lifestyle  . Physical activity    Days per week: Not on file    Minutes per session: Not on file  . Stress: Not on file  Relationships  . Social Herbalist on phone: Not on file    Gets together: Not on file    Attends religious service: Not on file    Active member of club or organization: Not on file    Attends meetings of clubs or organizations: Not on file    Relationship status: Not on file  . Intimate partner violence    Fear of current or ex partner: Not on file    Emotionally abused: Not on file  Physically abused: Not on file    Forced sexual activity: Not on file  Other Topics Concern  . Not on file  Social History Narrative  . Not on file    FH: No family history on file.  Past Medical History:  Diagnosis Date  . Coronary artery disease   . Hypertension     Current Outpatient Medications  Medication Sig Dispense Refill  . albuterol (PROVENTIL HFA;VENTOLIN HFA) 108 (90 Base) MCG/ACT inhaler Inhale 2 puffs into the lungs every 6 (six) hours as needed for wheezing or shortness of breath. 1 Inhaler 2  . aspirin EC 81 MG tablet Take 81 mg by mouth daily.    Marland Kitchen atorvastatin (LIPITOR) 40 MG tablet Take 1 tablet (40 mg total) by mouth daily at 6 PM. 30 tablet 5  . furosemide (LASIX) 40 MG tablet Take 1 tablet (40 mg total) by mouth as needed. Take as directed by the [facility] Failure Clinic 30  tablet 3  . losartan (COZAAR) 25 MG tablet Take 1 tablet (25 mg total) by mouth at bedtime. 30 tablet 5  . spironolactone (ALDACTONE) 25 MG tablet Take 0.5 tablets (12.5 mg total) by mouth at bedtime. 15 tablet 5  . carvedilol (COREG) 3.125 MG tablet Take 1 tablet (3.125 mg total) by mouth 2 (two) times daily for 30 days. 60 tablet 3  . ferrous sulfate 325 (65 FE) MG tablet Take 325 mg by mouth 2 (two) times daily with a meal.     No current facility-administered medications for this encounter.     Vitals:   12/25/18 1418  BP: 140/86  Pulse: 84  SpO2: 96%  Weight: 59.8 kg (131 lb 12.8 oz)    Wt Readings from Last 3 Encounters:  12/25/18 59.8 kg (131 lb 12.8 oz)  12/24/18 61.7 kg (136 lb)  12/10/18 61.5 kg (135 lb 9.6 oz)     PHYSICAL EXAM: General:  Thin.  No resp difficulty HEENT: normal Neck: supple. JVP flat. Carotids 2+ bilaterally; no bruits. No lymphadenopathy or thryomegaly appreciated. Cor: PMI normal. Regular rate & rhythm. No rubs, gallops or murmurs. Lungs: clear Abdomen: soft, nontender, nondistended. No hepatosplenomegaly. No bruits or masses. Good bowel sounds. Extremities: no cyanosis, clubbing, rash, edema Neuro: alert & orientedx3, cranial nerves grossly intact. Moves all 4 extremities w/o difficulty. Affect pleasant.    ASSESSMENT & PLAN: 1.Chronic Systolic Heart Failure, NICM ECHO 06/2018 EF 25-30% CMRI 06/27/2018 with EF 24% - Set up for repeat ECHO.   NYHAII-IIII. Volume status stable. He does not need diuretics.   -Increase carvedilol to 6.25 mg twice a day. esContinue carvedilol 3.125 mg twice a day.  -Continue dose losartan - Increase spironolactone to 25 mg daily.    Set up for repeat ECHO next visit.  -Check BMET today and in 7 days.   2. CAD S/P CABG 2017, LHC 06/2018 with 1V disease with occluded LAD within previous stent. No chest pain.  Continue aspirin 81 mg daily +atorvastatin.     3. LBBB  4.  Tobacco Abuse Discussed smoking  cessation.  5. HTN See above.    F/U 6 weeks with an ECHO   Romy Ipock NP-C  2:33 PM

## 2018-12-25 NOTE — Patient Instructions (Signed)
INCREASE Spironolactone to 25 mg, one tab daily INCREASE Coreg to 6.25 mg, one tab twice daily  Labs today We will only contact you if something comes back abnormal or we need to make some changes. Otherwise no news is good news!  Labs needed in 7-10 days  Your physician recommends that you schedule a follow-up appointment in: 6 weeks with Dr Haroldine Laws with echo  Your physician has requested that you have an echocardiogram. Echocardiography is a painless test that uses sound waves to create images of your heart. It provides your doctor with information about the size and shape of your heart and how well your heart's chambers and valves are working. This procedure takes approximately one hour. There are no restrictions for this procedure.  Do the following things EVERYDAY: 1) Weigh yourself in the morning before breakfast. Write it down and keep it in a log. 2) Take your medicines as prescribed 3) Eat low salt foods-Limit salt (sodium) to 2000 mg per day.  4) Stay as active as you can everyday 5) Limit all fluids for the day to less than 2 liters  At the [facility], you and your health needs are our priority. As part of our continuing mission to provide you with exceptional heart care, we have created designated Provider Care Teams. These Care Teams include your primary Cardiologist (physician) and Advanced Practice Providers (APPs- Physician Assistants and Nurse Practitioners) who all work together to provide you with the care you need, when you need it.   You may see any of the following providers on your designated Care Team at your next follow up: Marland Kitchen Dr Glori Bickers . Dr Loralie Champagne . Darrick Grinder, NP   Please be sure to bring in all your medications bottles to every appointment.

## 2018-12-26 ENCOUNTER — Other Ambulatory Visit (HOSPITAL_COMMUNITY): Payer: Self-pay

## 2018-12-26 NOTE — Progress Notes (Signed)
I saw Jeremy Powers at home today and reported feeling well. He had not picked up his new prescription for carvedilol and spironolactone however he had enough to double up on coreg and replace the 1/2 pills of spironolactone with a whole pill. Nothing further was needed at this time.

## 2018-12-30 ENCOUNTER — Telehealth (HOSPITAL_COMMUNITY): Payer: Self-pay

## 2018-12-30 NOTE — Telephone Encounter (Signed)
I called Jeremy Powers to schedule an appointment. He stated he would be available tomorrow and agreed to meet me at 09:30

## 2018-12-31 ENCOUNTER — Other Ambulatory Visit (HOSPITAL_COMMUNITY): Payer: Self-pay

## 2018-12-31 NOTE — Addendum Note (Signed)
Encounter addended by: Conrad , NP on: 12/31/2018 3:39 PM  Actions taken: Level of Service modified, Visit diagnoses modified

## 2018-12-31 NOTE — Progress Notes (Signed)
Paramedicine Encounter    Patient ID: Jeremy Powers, male    DOB: Sep 14, 1955, 63 y.o.   MRN: [id number]   Patient Care Team: [facility], [facility] as PCP - General (Pediatrics)  Patient Active Problem List   Diagnosis Date Noted  . Acute systolic (congestive) heart failure ([place]) 06/24/2018  . Aortic insufficiency 06/22/2018  . Acute on chronic congestive heart failure ([place]) 06/22/2018  . Elevated troponin 06/22/2018  . CAD (coronary artery disease) 06/22/2018  . Left bundle branch block 06/22/2018  . History of iron deficiency anemia 06/22/2018  . Acute on chronic heart failure ([place]) 06/22/2018    Current Outpatient Medications:  .  albuterol (VENTOLIN HFA) 108 (90 Base) MCG/ACT inhaler, Inhale 2 puffs into the lungs every 6 (six) hours as needed for wheezing or shortness of breath., Disp: 18 g, Rfl: 1 .  aspirin EC 81 MG tablet, Take 81 mg by mouth daily., Disp: , Rfl:  .  atorvastatin (LIPITOR) 40 MG tablet, Take 1 tablet (40 mg total) by mouth daily at 6 PM., Disp: 30 tablet, Rfl: 5 .  carvedilol (COREG) 6.25 MG tablet, Take 1 tablet (6.25 mg total) by mouth 2 (two) times daily., Disp: 60 tablet, Rfl: 3 .  ferrous sulfate 325 (65 FE) MG tablet, Take 325 mg by mouth 2 (two) times daily with a meal., Disp: , Rfl:  .  furosemide (LASIX) 40 MG tablet, Take 1 tablet (40 mg total) by mouth as needed. Take as directed by the [facility] Failure Clinic, Disp: 30 tablet, Rfl: 3 .  losartan (COZAAR) 25 MG tablet, Take 1 tablet (25 mg total) by mouth at bedtime., Disp: 30 tablet, Rfl: 5 .  spironolactone (ALDACTONE) 25 MG tablet, Take 1 tablet (25 mg total) by mouth at bedtime., Disp: 30 tablet, Rfl: 5 No Known Allergies    Social History   Socioeconomic History  . Marital status: Single    Spouse name: Not on file  . Number of children: Not on file  . Years of education: Not on file  . Highest education level: Not on file  Occupational History  . Not on file  Social  Needs  . Financial resource strain: Not on file  . Food insecurity    Worry: Not on file    Inability: Not on file  . Transportation needs    Medical: Not on file    Non-medical: Not on file  Tobacco Use  . Smoking status: Current Every Day Smoker    Packs/day: 0.50    Types: Cigarettes  . Smokeless tobacco: Never Used  Substance and Sexual Activity  . Alcohol use: Yes    Alcohol/week: 2.0 standard drinks    Types: 2 Cans of beer per week    Comment: 2 40oz beer daily  . Drug use: Yes    Types: Marijuana  . Sexual activity: Not on file  Lifestyle  . Physical activity    Days per week: Not on file    Minutes per session: Not on file  . Stress: Not on file  Relationships  . Social Herbalist on phone: Not on file    Gets together: Not on file    Attends religious service: Not on file    Active member of club or organization: Not on file    Attends meetings of clubs or organizations: Not on file    Relationship status: Not on file  . Intimate partner violence    Fear of  current or ex partner: Not on file    Emotionally abused: Not on file    Physically abused: Not on file    Forced sexual activity: Not on file  Other Topics Concern  . Not on file  Social History Narrative  . Not on file    Physical Exam Cardiovascular:     Rate and Rhythm: Normal rate and regular rhythm.     Pulses: Normal pulses.  Pulmonary:     Effort: Pulmonary effort is normal.     Breath sounds: Examination of the right-upper field reveals wheezing. Examination of the right-lower field reveals wheezing. Wheezing present.  Musculoskeletal: Normal range of motion.     Right lower leg: No edema.     Left lower leg: No edema.  Skin:    General: Skin is warm and dry.     Capillary Refill: Capillary refill takes less than 2 seconds.  Neurological:     Mental Status: He is alert and oriented to person, place, and time.  Psychiatric:        Mood and Affect: Mood normal.          Future Appointments  Date Time Provider Department Center  01/01/2019 12:45 PM [facility] LAB [facility] None  02/05/2019  2:00 PM [facility] ECHO OP 1 [facility]  02/05/2019  3:00 PM Laurey Morale, MD [facility] None    BP 135/77 (BP Location: Left Arm, Patient Position: Sitting, Cuff Size: Normal)   Pulse 74   Resp 16   Wt 137 lb 3.2 oz (62.2 kg)   SpO2 99%   BMI 22.14 kg/m   Weight yesterday- 136.4 lb Last visit weight- 131.8 lb  [name] was seen at home today and reported feeling well. He denied chest pain, SOB, headache, dizziness, orthopnea cough or fever since our last visit. He stated he has been compliant with his medications over the past week however his weight has increased significantly. He stated he had eaten some pigs feet for the past two days which are high in sodium. He stated he would be more careful about what he eats moving forward. I gave him lasix for the next three days and will check in via phone on Friday to see how his weight is trending. He was understanding and agreeable.  Jacqualine Code, EMT 12/31/18  ACTION: Home visit completed Next visit planned for 1 week Next call planned for Friday

## 2019-01-01 ENCOUNTER — Other Ambulatory Visit (HOSPITAL_COMMUNITY): Payer: Medicare Other

## 2019-01-05 ENCOUNTER — Other Ambulatory Visit (HOSPITAL_COMMUNITY): Payer: Medicare Other

## 2019-01-07 ENCOUNTER — Other Ambulatory Visit (HOSPITAL_COMMUNITY): Payer: Self-pay

## 2019-01-07 NOTE — Progress Notes (Signed)
Paramedicine Encounter    Patient ID: Jeremy Powers, male    DOB: 1956/01/19, 63 y.o.   MRN: [id number]   Patient Care Team: [facility] as PCP - General (Pediatrics)  Patient Active Problem List   Diagnosis Date Noted  . Acute systolic (congestive) heart failure (HCC) 06/24/2018  . Aortic insufficiency 06/22/2018  . Acute on chronic congestive heart failure (HCC) 06/22/2018  . Elevated troponin 06/22/2018  . CAD (coronary artery disease) 06/22/2018  . Left bundle branch block 06/22/2018  . History of iron deficiency anemia 06/22/2018  . Acute on chronic heart failure (HCC) 06/22/2018    Current Outpatient Medications:  .  albuterol (VENTOLIN HFA) 108 (90 Base) MCG/ACT inhaler, Inhale 2 puffs into the lungs every 6 (six) hours as needed for wheezing or shortness of breath., Disp: 18 g, Rfl: 1 .  aspirin EC 81 MG tablet, Take 81 mg by mouth daily., Disp: , Rfl:  .  atorvastatin (LIPITOR) 40 MG tablet, Take 1 tablet (40 mg total) by mouth daily at 6 PM., Disp: 30 tablet, Rfl: 5 .  carvedilol (COREG) 6.25 MG tablet, Take 1 tablet (6.25 mg total) by mouth 2 (two) times daily., Disp: 60 tablet, Rfl: 3 .  ferrous sulfate 325 (65 FE) MG tablet, Take 325 mg by mouth 2 (two) times daily with a meal., Disp: , Rfl:  .  furosemide (LASIX) 40 MG tablet, Take 1 tablet (40 mg total) by mouth as needed. Take as directed by the Heart Failure Clinic, Disp: 30 tablet, Rfl: 3 .  losartan (COZAAR) 25 MG tablet, Take 1 tablet (25 mg total) by mouth at bedtime., Disp: 30 tablet, Rfl: 5 .  spironolactone (ALDACTONE) 25 MG tablet, Take 1 tablet (25 mg total) by mouth at bedtime., Disp: 30 tablet, Rfl: 5 No Known Allergies    Social History   Socioeconomic History  . Marital status: Single    Spouse name: Not on file  . Number of children: Not on file  . Years of education: Not on file  . Highest education level: Not on file  Occupational History  . Not on file  Social  Needs  . Financial resource strain: Not on file  . Food insecurity    Worry: Not on file    Inability: Not on file  . Transportation needs    Medical: Not on file    Non-medical: Not on file  Tobacco Use  . Smoking status: Current Every Day Smoker    Packs/day: 0.50    Types: Cigarettes  . Smokeless tobacco: Never Used  Substance and Sexual Activity  . Alcohol use: Yes    Alcohol/week: 2.0 standard drinks    Types: 2 Cans of beer per week    Comment: 2 40oz beer daily  . Drug use: Yes    Types: Marijuana  . Sexual activity: Not on file  Lifestyle  . Physical activity    Days per week: Not on file    Minutes per session: Not on file  . Stress: Not on file  Relationships  . Social Musician on phone: Not on file    Gets together: Not on file    Attends religious service: Not on file    Active member of club or organization: Not on file    Attends meetings of clubs or organizations: Not on file    Relationship status: Not on file  . Intimate partner violence    Fear of  current or ex partner: Not on file    Emotionally abused: Not on file    Physically abused: Not on file    Forced sexual activity: Not on file  Other Topics Concern  . Not on file  Social History Narrative  . Not on file    Physical Exam Cardiovascular:     Rate and Rhythm: Normal rate and regular rhythm.     Pulses: Normal pulses.  Pulmonary:     Effort: Pulmonary effort is normal.     Breath sounds: Examination of the right-lower field reveals wheezing. Wheezing present.  Musculoskeletal: Normal range of motion.     Right lower leg: No edema.     Left lower leg: No edema.  Skin:    General: Skin is warm and dry.     Capillary Refill: Capillary refill takes less than 2 seconds.  Neurological:     Mental Status: He is alert and oriented to person, place, and time.  Psychiatric:        Mood and Affect: Mood normal.         Future Appointments  Date Time Provider [place]  02/05/2019  2:00 PM [place] ECHO OP 1 [facility]  02/05/2019  3:00 PM Larey Dresser, MD [facility] None    BP 136/72 (BP Location: Right Arm, Patient Position: Sitting, Cuff Size: Normal)   Pulse 74   Resp 18   Wt 134 lb 11.2 oz (61.1 kg)   SpO2 99%   BMI 21.74 kg/m   Weight yesterday- Did not weigh Last visit weight-137.4 lb   Jeremy Powers was seen at home today and reported feeling well . He denied chest pain, SOB, headache, dizziness, orthopnea, fever or cough over the past week. He reported being compliant with his medications over the past week and his weight has been stable. His medications were verified and his pillbox was refilled. He missed his lab appointment on Monday so I will work on getting him rescheduled. I will follow up next week.   [name], EMT 01/07/19  ACTION: Home visit completed Next visit planned for 1 week

## 2019-01-14 ENCOUNTER — Other Ambulatory Visit (HOSPITAL_COMMUNITY): Payer: Self-pay

## 2019-01-14 NOTE — Progress Notes (Signed)
Paramedicine Encounter    Patient ID: Jeremy Powers, male    DOB: Feb 17, 1956, 63 y.o.   MRN: [id number]   Patient Care Team: Inc, [facility] as PCP - General (Pediatrics)  Patient Active Problem List   Diagnosis Date Noted  . Acute systolic (congestive) heart failure (HCC) 06/24/2018  . Aortic insufficiency 06/22/2018  . Acute on chronic congestive heart failure (HCC) 06/22/2018  . Elevated troponin 06/22/2018  . CAD (coronary artery disease) 06/22/2018  . Left bundle branch block 06/22/2018  . History of iron deficiency anemia 06/22/2018  . Acute on chronic heart failure (HCC) 06/22/2018    Current Outpatient Medications:  .  albuterol (VENTOLIN HFA) 108 (90 Base) MCG/ACT inhaler, Inhale 2 puffs into the lungs every 6 (six) hours as needed for wheezing or shortness of breath., Disp: 18 g, Rfl: 1 .  aspirin EC 81 MG tablet, Take 81 mg by mouth daily., Disp: , Rfl:  .  atorvastatin (LIPITOR) 40 MG tablet, Take 1 tablet (40 mg total) by mouth daily at 6 PM., Disp: 30 tablet, Rfl: 5 .  carvedilol (COREG) 6.25 MG tablet, Take 1 tablet (6.25 mg total) by mouth 2 (two) times daily., Disp: 60 tablet, Rfl: 3 .  losartan (COZAAR) 25 MG tablet, Take 1 tablet (25 mg total) by mouth at bedtime., Disp: 30 tablet, Rfl: 5 .  spironolactone (ALDACTONE) 25 MG tablet, Take 1 tablet (25 mg total) by mouth at bedtime., Disp: 30 tablet, Rfl: 5 .  ferrous sulfate 325 (65 FE) MG tablet, Take 325 mg by mouth 2 (two) times daily with a meal., Disp: , Rfl:  .  furosemide (LASIX) 40 MG tablet, Take 1 tablet (40 mg total) by mouth as needed. Take as directed by the [facility] (Patient not taking: Reported on 01/14/2019), Disp: 30 tablet, Rfl: 3 No Known Allergies    Social History   Socioeconomic History  . Marital status: Single    Spouse name: Not on file  . Number of children: Not on file  . Years of education: Not on file  . Highest education level: Not on file   Occupational History  . Not on file  Social Needs  . Financial resource strain: Not on file  . Food insecurity    Worry: Not on file    Inability: Not on file  . Transportation needs    Medical: Not on file    Non-medical: Not on file  Tobacco Use  . Smoking status: Current Every Day Smoker    Packs/day: 0.50    Types: Cigarettes  . Smokeless tobacco: Never Used  Substance and Sexual Activity  . Alcohol use: Yes    Alcohol/week: 2.0 standard drinks    Types: 2 Cans of beer per week    Comment: 2 40oz beer daily  . Drug use: Yes    Types: Marijuana  . Sexual activity: Not on file  Lifestyle  . Physical activity    Days per week: Not on file    Minutes per session: Not on file  . Stress: Not on file  Relationships  . Social Musician on phone: Not on file    Gets together: Not on file    Attends religious service: Not on file    Active member of club or organization: Not on file    Attends meetings of clubs or organizations: Not on file    Relationship status: Not on file  . Intimate partner  violence    Fear of current or ex partner: Not on file    Emotionally abused: Not on file    Physically abused: Not on file    Forced sexual activity: Not on file  Other Topics Concern  . Not on file  Social History Narrative  . Not on file    Physical Exam Cardiovascular:     Rate and Rhythm: Normal rate and regular rhythm.     Pulses: Normal pulses.  Pulmonary:     Effort: Pulmonary effort is normal.     Breath sounds: Normal breath sounds.  Abdominal:     General: Abdomen is flat. There is no distension.  Musculoskeletal: Normal range of motion.     Right lower leg: No edema.     Left lower leg: No edema.  Skin:    General: Skin is warm and dry.     Capillary Refill: Capillary refill takes less than 2 seconds.  Neurological:     Mental Status: He is alert and oriented to person, place, and time.  Psychiatric:        Mood and Affect: Mood normal.          Future Appointments  Date Time Provider [place]  02/05/2019  2:00 PM [facility] ECHO OP 1 [facility]  02/05/2019  3:00 PM Larey Dresser, MD [facility] None    BP 123/74 (BP Location: Left Arm, Patient Position: Sitting, Cuff Size: Normal)   Pulse 76   Resp 16   Wt 131 lb 11.2 oz (59.7 kg)   SpO2 98%   BMI 21.26 kg/m   Weight yesterday- 134.1 lb Last visit weight- 134.7 lb  Jeremy Powers was seen at home today and rpeorted feeling well. He denied chest pain, SOB, headache, dizziness, orthopnea, cough or fever since our last visit. He stated he has been compliant with his medications over the past week however his pillbox showed he had missed 3 days. I asked about this and he said he wasn't feeling well on those days so her opted not to take them. I explained the importance of his medication compliance and he expressed understanding. His medications were verified and his pillbox was refilled. He still has not picked up an iron supplement but said he would get it this week. I will follow up next week.   [name], EMT 01/14/19  ACTION: Home visit completed Next visit planned for 1 week

## 2019-01-21 ENCOUNTER — Telehealth (HOSPITAL_COMMUNITY): Payer: Self-pay | Admitting: Licensed Clinical Social Worker

## 2019-01-21 ENCOUNTER — Other Ambulatory Visit (HOSPITAL_COMMUNITY): Payer: Self-pay

## 2019-01-21 NOTE — Progress Notes (Signed)
Paramedicine Encounter    Patient ID: Jeremy Powers, male    DOB: [birth year], 63 y.o.   MRN: [id number]   Patient Care Team: [facility] as PCP - General (Pediatrics)  Patient Active Problem List   Diagnosis Date Noted  . Acute systolic (congestive) heart failure ([place]) 06/24/2018  . Aortic insufficiency 06/22/2018  . Acute on chronic congestive heart failure ([place]) 06/22/2018  . Elevated troponin 06/22/2018  . CAD (coronary artery disease) 06/22/2018  . Left bundle branch block 06/22/2018  . History of iron deficiency anemia 06/22/2018  . Acute on chronic heart failure ([place]) 06/22/2018    Current Outpatient Medications:  .  albuterol (VENTOLIN HFA) 108 (90 Base) MCG/ACT inhaler, Inhale 2 puffs into the lungs every 6 (six) hours as needed for wheezing or shortness of breath., Disp: 18 g, Rfl: 1 .  aspirin EC 81 MG tablet, Take 81 mg by mouth daily., Disp: , Rfl:  .  atorvastatin (LIPITOR) 40 MG tablet, Take 1 tablet (40 mg total) by mouth daily at 6 PM., Disp: 30 tablet, Rfl: 5 .  carvedilol (COREG) 6.25 MG tablet, Take 1 tablet (6.25 mg total) by mouth 2 (two) times daily., Disp: 60 tablet, Rfl: 3 .  furosemide (LASIX) 40 MG tablet, Take 1 tablet (40 mg total) by mouth as needed. Take as directed by the [facility] Failure Clinic, Disp: 30 tablet, Rfl: 3 .  losartan (COZAAR) 25 MG tablet, Take 1 tablet (25 mg total) by mouth at bedtime., Disp: 30 tablet, Rfl: 5 .  spironolactone (ALDACTONE) 25 MG tablet, Take 1 tablet (25 mg total) by mouth at bedtime., Disp: 30 tablet, Rfl: 5 .  ferrous sulfate 325 (65 FE) MG tablet, Take 325 mg by mouth 2 (two) times daily with a meal., Disp: , Rfl:  No Known Allergies    Social History   Socioeconomic History  . Marital status: Single    Spouse name: Not on file  . Number of children: Not on file  . Years of education: Not on file  . Highest education level: Not on file  Occupational History  . Not on file  Social  Needs  . Financial resource strain: Not on file  . Food insecurity    Worry: Not on file    Inability: Not on file  . Transportation needs    Medical: Not on file    Non-medical: Not on file  Tobacco Use  . Smoking status: Current Every Day Smoker    Packs/day: 0.50    Types: Cigarettes  . Smokeless tobacco: Never Used  Substance and Sexual Activity  . Alcohol use: Yes    Alcohol/week: 2.0 standard drinks    Types: 2 Cans of beer per week    Comment: 2 40oz beer daily  . Drug use: Yes    Types: Marijuana  . Sexual activity: Not on file  Lifestyle  . Physical activity    Days per week: Not on file    Minutes per session: Not on file  . Stress: Not on file  Relationships  . Social Herbalist on phone: Not on file    Gets together: Not on file    Attends religious service: Not on file    Active member of club or organization: Not on file    Attends meetings of clubs or organizations: Not on file    Relationship status: Not on file  . Intimate partner violence    Fear of  current or ex partner: Not on file    Emotionally abused: Not on file    Physically abused: Not on file    Forced sexual activity: Not on file  Other Topics Concern  . Not on file  Social History Narrative  . Not on file    Physical Exam Cardiovascular:     Rate and Rhythm: Normal rate and regular rhythm.     Pulses: Normal pulses.  Pulmonary:     Effort: Pulmonary effort is normal.     Breath sounds: Wheezing present.  Musculoskeletal: Normal range of motion.     Right lower leg: No edema.     Left lower leg: No edema.  Skin:    General: Skin is warm and dry.     Capillary Refill: Capillary refill takes less than 2 seconds.  Neurological:     Mental Status: He is alert and oriented to person, place, and time.  Psychiatric:        Mood and Affect: Mood normal.         Future Appointments  Date Time Provider Department Center  02/05/2019  2:00 PM [facility] ECHO OP 1 [facility]   02/05/2019  3:00 PM Laurey Morale, MD [facility] None    BP 128/75 (BP Location: Left Arm, Patient Position: Sitting, Cuff Size: Normal)   Pulse 78   Resp 18   Wt 135 lb 12.8 oz (61.6 kg)   SpO2 99%   BMI 21.92 kg/m   Weight yesterday- 133 lb Last visit weight- 131.8 lb  Mr Entin was seen at home today and reported feeling well. He denied chest pain, SOB, headache, dizziness, orthopnea, cough or fever. He stated he has been compliant with his medications however he did not take them over the weekend because he "needs a break." He stated he would start taking them over the weekend because he understands it is bad for his health. His medications were verified and his pillbox was refilled. I will follow up next week.   Jacqualine Code, EMT 01/21/19  ACTION: Home visit completed Next visit planned for 1 week

## 2019-01-21 NOTE — Telephone Encounter (Signed)
CSW reached out to pt to check in regarding food and medication status at this time. Unable to reach pt- left VM encouraging pt to reach out with any concerns  Jorge Ny, [place] Worker [facility] [phone]

## 2019-01-27 ENCOUNTER — Other Ambulatory Visit (HOSPITAL_COMMUNITY): Payer: Self-pay

## 2019-01-27 ENCOUNTER — Telehealth (HOSPITAL_COMMUNITY): Payer: Self-pay

## 2019-01-27 NOTE — Progress Notes (Signed)
Paramedicine Encounter    Patient ID: Jeremy Powers, male    DOB: April 26, 1956, 63 y.o.   MRN: [id number]   Patient Care Team: [facility] as PCP - General (Pediatrics)  Patient Active Problem List   Diagnosis Date Noted  . Acute systolic (congestive) heart failure (HCC) 06/24/2018  . Aortic insufficiency 06/22/2018  . Acute on chronic congestive heart failure (HCC) 06/22/2018  . Elevated troponin 06/22/2018  . CAD (coronary artery disease) 06/22/2018  . Left bundle branch block 06/22/2018  . History of iron deficiency anemia 06/22/2018  . Acute on chronic heart failure (HCC) 06/22/2018    Current Outpatient Medications:  .  albuterol (VENTOLIN HFA) 108 (90 Base) MCG/ACT inhaler, Inhale 2 puffs into the lungs every 6 (six) hours as needed for wheezing or shortness of breath., Disp: 18 g, Rfl: 1 .  aspirin EC 81 MG tablet, Take 81 mg by mouth daily., Disp: , Rfl:  .  carvedilol (COREG) 6.25 MG tablet, Take 1 tablet (6.25 mg total) by mouth 2 (two) times daily., Disp: 60 tablet, Rfl: 3 .  furosemide (LASIX) 40 MG tablet, Take 1 tablet (40 mg total) by mouth as needed. Take as directed by the [facility] Failure Clinic, Disp: 30 tablet, Rfl: 3 .  losartan (COZAAR) 25 MG tablet, Take 1 tablet (25 mg total) by mouth at bedtime., Disp: 30 tablet, Rfl: 5 .  spironolactone (ALDACTONE) 25 MG tablet, Take 1 tablet (25 mg total) by mouth at bedtime., Disp: 30 tablet, Rfl: 5 .  atorvastatin (LIPITOR) 40 MG tablet, Take 1 tablet (40 mg total) by mouth daily at 6 PM., Disp: 30 tablet, Rfl: 5 .  ferrous sulfate 325 (65 FE) MG tablet, Take 325 mg by mouth 2 (two) times daily with a meal., Disp: , Rfl:  No Known Allergies    Social History   Socioeconomic History  . Marital status: Single    Spouse name: Not on file  . Number of children: Not on file  . Years of education: Not on file  . Highest education level: Not on file  Occupational History  . Not on file  Social  Needs  . Financial resource strain: Not on file  . Food insecurity    Worry: Not on file    Inability: Not on file  . Transportation needs    Medical: Not on file    Non-medical: Not on file  Tobacco Use  . Smoking status: Current Every Day Smoker    Packs/day: 0.50    Types: Cigarettes  . Smokeless tobacco: Never Used  Substance and Sexual Activity  . Alcohol use: Yes    Alcohol/week: 2.0 standard drinks    Types: 2 Cans of beer per week    Comment: 2 40oz beer daily  . Drug use: Yes    Types: Marijuana  . Sexual activity: Not on file  Lifestyle  . Physical activity    Days per week: Not on file    Minutes per session: Not on file  . Stress: Not on file  Relationships  . Social Musician on phone: Not on file    Gets together: Not on file    Attends religious service: Not on file    Active member of club or organization: Not on file    Attends meetings of clubs or organizations: Not on file    Relationship status: Not on file  . Intimate partner violence    Fear of  current or ex partner: Not on file    Emotionally abused: Not on file    Physically abused: Not on file    Forced sexual activity: Not on file  Other Topics Concern  . Not on file  Social History Narrative  . Not on file    Physical Exam Cardiovascular:     Rate and Rhythm: Normal rate and regular rhythm.     Pulses: Normal pulses.  Pulmonary:     Effort: Pulmonary effort is normal.     Breath sounds: Examination of the right-upper field reveals wheezing. Examination of the right-lower field reveals wheezing. Examination of the left-lower field reveals wheezing. Wheezing present.  Musculoskeletal: Normal range of motion.     Right lower leg: Edema present.     Left lower leg: Edema present.  Skin:    General: Skin is warm and dry.     Capillary Refill: Capillary refill takes less than 2 seconds.  Neurological:     Mental Status: He is oriented to person, place, and time.  Psychiatric:         Mood and Affect: Mood normal.         Future Appointments  Date Time Provider [place]  02/05/2019  2:00 PM [place] ECHO OP 1 [facility]  02/05/2019  3:00 PM Larey Dresser, MD [facility] None    BP 139/80 (BP Location: Left Arm, Patient Position: Sitting, Cuff Size: Normal)   Pulse 79   Resp 20   Wt 140 lb 3.2 oz (63.6 kg)   SpO2 98%   BMI 22.63 kg/m   Weight yesterday- Did not weigh Last visit weight- 135.8 lb  Mr Bynum was seen at home today and reported feeling unwell. He stated he has been SOB lately which has been worsening at night. He has not been compliant with his medications over the past week and his weight has increased 5 pounds since last week. I added lasix over the next three days and stressed the importance of medication compliance. He was understanding and said he would do better this week. His medications were verified and his pillbox was amended. I will follow up via phone towards the end of the week.   [name], EMT 01/27/19  ACTION: Home visit completed Next visit planned for 1 week

## 2019-01-27 NOTE — Telephone Encounter (Signed)
I received a voicemail from Jeremy Powers' girlfriend stating Jeremy Powers seemed to be having difficulty breathing  Over the weekend. I called him this morning and he advised that it was somewhat improved but he was still having trouble. I advised I would com see him today and try to work through the problem. He was understanding and agreeable.

## 2019-02-04 ENCOUNTER — Other Ambulatory Visit (HOSPITAL_COMMUNITY): Payer: Self-pay

## 2019-02-04 NOTE — Progress Notes (Signed)
Paramedicine Encounter    Patient ID: Jeremy Powers, male    DOB: 04/12/1956, 63 y.o.   MRN: [id number]   Patient Care Team: [facility] as PCP - General (Pediatrics)  Patient Active Problem List   Diagnosis Date Noted  . Acute systolic (congestive) heart failure ([place]) 06/24/2018  . Aortic insufficiency 06/22/2018  . Acute on chronic congestive heart failure ([place]) 06/22/2018  . Elevated troponin 06/22/2018  . CAD (coronary artery disease) 06/22/2018  . Left bundle branch block 06/22/2018  . History of iron deficiency anemia 06/22/2018  . Acute on chronic heart failure ([place]) 06/22/2018    Current Outpatient Medications:  .  albuterol (VENTOLIN HFA) 108 (90 Base) MCG/ACT inhaler, Inhale 2 puffs into the lungs every 6 (six) hours as needed for wheezing or shortness of breath., Disp: 18 g, Rfl: 1 .  aspirin EC 81 MG tablet, Take 81 mg by mouth daily., Disp: , Rfl:  .  atorvastatin (LIPITOR) 40 MG tablet, Take 1 tablet (40 mg total) by mouth daily at 6 PM., Disp: 30 tablet, Rfl: 5 .  carvedilol (COREG) 6.25 MG tablet, Take 1 tablet (6.25 mg total) by mouth 2 (two) times daily., Disp: 60 tablet, Rfl: 3 .  furosemide (LASIX) 40 MG tablet, Take 1 tablet (40 mg total) by mouth as needed. Take as directed by the [facility] Failure Clinic, Disp: 30 tablet, Rfl: 3 .  losartan (COZAAR) 25 MG tablet, Take 1 tablet (25 mg total) by mouth at bedtime., Disp: 30 tablet, Rfl: 5 .  spironolactone (ALDACTONE) 25 MG tablet, Take 1 tablet (25 mg total) by mouth at bedtime., Disp: 30 tablet, Rfl: 5 .  ferrous sulfate 325 (65 FE) MG tablet, Take 325 mg by mouth 2 (two) times daily with a meal., Disp: , Rfl:  No Known Allergies    Social History   Socioeconomic History  . Marital status: Single    Spouse name: Not on file  . Number of children: Not on file  . Years of education: Not on file  . Highest education level: Not on file  Occupational History  . Not on file  Social  Needs  . Financial resource strain: Not on file  . Food insecurity    Worry: Not on file    Inability: Not on file  . Transportation needs    Medical: Not on file    Non-medical: Not on file  Tobacco Use  . Smoking status: Current Every Day Smoker    Packs/day: 0.50    Types: Cigarettes  . Smokeless tobacco: Never Used  Substance and Sexual Activity  . Alcohol use: Yes    Alcohol/week: 2.0 standard drinks    Types: 2 Cans of beer per week    Comment: 2 40oz beer daily  . Drug use: Yes    Types: Marijuana  . Sexual activity: Not on file  Lifestyle  . Physical activity    Days per week: Not on file    Minutes per session: Not on file  . Stress: Not on file  Relationships  . Social Herbalist on phone: Not on file    Gets together: Not on file    Attends religious service: Not on file    Active member of club or organization: Not on file    Attends meetings of clubs or organizations: Not on file    Relationship status: Not on file  . Intimate partner violence    Fear of  current or ex partner: Not on file    Emotionally abused: Not on file    Physically abused: Not on file    Forced sexual activity: Not on file  Other Topics Concern  . Not on file  Social History Narrative  . Not on file    Physical Exam Cardiovascular:     Rate and Rhythm: Normal rate and regular rhythm.     Pulses: Normal pulses.  Pulmonary:     Effort: Pulmonary effort is normal.     Breath sounds: Examination of the right-upper field reveals wheezing. Examination of the left-upper field reveals wheezing. Examination of the right-lower field reveals wheezing. Examination of the left-lower field reveals wheezing. Wheezing present.  Abdominal:     General: There is no distension.  Musculoskeletal: Normal range of motion.     Right lower leg: Edema present.     Left lower leg: Edema present.  Skin:    General: Skin is warm and dry.     Capillary Refill: Capillary refill takes less than  2 seconds.  Neurological:     Mental Status: He is alert and oriented to person, place, and time.  Psychiatric:        Mood and Affect: Mood normal.         Future Appointments  Date Time Provider Department Center  02/05/2019  2:00 PM [facility] ECHO OP 1 [facility]  02/05/2019  3:00 PM Laurey Morale, MD [facility] None    BP 124/62 (BP Location: Left Arm, Patient Position: Sitting, Cuff Size: Normal)   Pulse 70   Resp 16   Wt 138 lb (62.6 kg)   SpO2 97%   BMI 22.27 kg/m   Weight yesterday- 135 lb  Last visit weight- 140 lb  Jeremy Powers was seen at home today and rpeorted feeling well. He denied chest pain, SOB, headache, dizziness, orthopnea, fever or cough since his last visit. He stated he has been compliant with his medications however I found a bottle with several days worth of miscellaneous pills which appeared to be similar to the pills placed in the Thursday-Saturday bins last week. This was evident by the presence of lasix in the bottle which he only takes PRN and had been added to his box on those days last week. I asked Jeremy Powers about this and he denied knowledge of where they came from. His weight has come down 2 lb since last week but was up 3 lb from yesterday and he maintains BLEE. I added lasix to his pillbox for the next two days and reminded him to limit his fluid and sodium intake. He was understanding and agreeable. His medications were verified and his pillbox was refilled. I will follow up at his clinic appointment tomorrow.   Jacqualine Code, EMT 02/04/19  ACTION: Home visit completed Next visit planned for tomorrow at echo visit

## 2019-02-05 ENCOUNTER — Other Ambulatory Visit: Payer: Self-pay

## 2019-02-05 ENCOUNTER — Ambulatory Visit (HOSPITAL_COMMUNITY)
Admission: RE | Admit: 2019-02-05 | Discharge: 2019-02-05 | Disposition: A | Discharge status: Home / Self Care | Payer: Medicare Other | Source: Ambulatory Visit | Attending: Cardiology | Admitting: Cardiology

## 2019-02-05 ENCOUNTER — Encounter (HOSPITAL_COMMUNITY): Payer: Self-pay | Admitting: Cardiology

## 2019-02-05 ENCOUNTER — Ambulatory Visit (HOSPITAL_COMMUNITY)
Admission: RE | Admit: 2019-02-05 | Discharge: 2019-02-05 | Disposition: A | Discharge status: Home / Self Care | Payer: Medicare Other | Source: Ambulatory Visit | Attending: Internal Medicine | Admitting: Internal Medicine

## 2019-02-05 VITALS — BP 131/64 | HR 73 | Wt 137.0 lb

## 2019-02-05 DIAGNOSIS — Z951 Presence of aortocoronary bypass graft: Secondary | ICD-10-CM | POA: Insufficient documentation

## 2019-02-05 DIAGNOSIS — I2510 Atherosclerotic heart disease of native coronary artery without angina pectoris: Secondary | ICD-10-CM | POA: Diagnosis not present

## 2019-02-05 DIAGNOSIS — I447 Left bundle-branch block, unspecified: Secondary | ICD-10-CM | POA: Diagnosis not present

## 2019-02-05 DIAGNOSIS — Z7982 Long term (current) use of aspirin: Secondary | ICD-10-CM | POA: Diagnosis not present

## 2019-02-05 DIAGNOSIS — D509 Iron deficiency anemia, unspecified: Secondary | ICD-10-CM | POA: Diagnosis not present

## 2019-02-05 DIAGNOSIS — I5022 Chronic systolic (congestive) heart failure: Secondary | ICD-10-CM

## 2019-02-05 DIAGNOSIS — I110 Hypertensive heart disease with heart failure: Secondary | ICD-10-CM | POA: Insufficient documentation

## 2019-02-05 DIAGNOSIS — F17210 Nicotine dependence, cigarettes, uncomplicated: Secondary | ICD-10-CM | POA: Diagnosis not present

## 2019-02-05 DIAGNOSIS — I428 Other cardiomyopathies: Secondary | ICD-10-CM | POA: Diagnosis not present

## 2019-02-05 DIAGNOSIS — Z79899 Other long term (current) drug therapy: Secondary | ICD-10-CM | POA: Diagnosis not present

## 2019-02-05 DIAGNOSIS — I252 Old myocardial infarction: Secondary | ICD-10-CM | POA: Insufficient documentation

## 2019-02-05 LAB — BASIC METABOLIC PANEL
Anion gap: 12 (ref 5–15)
BUN: 14 mg/dL (ref 8–23)
CO2: 23 mmol/L (ref 22–32)
Calcium: 8.5 mg/dL — ABNORMAL LOW (ref 8.9–10.3)
Chloride: 103 mmol/L (ref 98–111)
Creatinine, Ser: 0.91 mg/dL (ref 0.61–1.24)
GFR calc Af Amer: >60 mL/min (ref >60)
GFR calc non Af Amer: >60 mL/min (ref >60)
Glucose, Bld: 92 mg/dL (ref 70–99)
Potassium: 3.8 mmol/L (ref 3.5–5.1)
Sodium: 138 mmol/L (ref 135–145)

## 2019-02-05 LAB — BRAIN NATRIURETIC PEPTIDE: B Natriuretic Peptide: >4500 pg/mL — ABNORMAL HIGH (ref 0.0–100.0)

## 2019-02-05 LAB — CBC
HCT: 38.5 % — ABNORMAL LOW (ref 39.0–52.0)
Hemoglobin: 13.1 g/dL (ref 13.0–17.0)
MCH: 33 pg (ref 26.0–34.0)
MCHC: 34 g/dL (ref 30.0–36.0)
MCV: 97 fL (ref 80.0–100.0)
Platelets: 210 10*3/uL (ref 150–400)
RBC: 3.97 MIL/uL — ABNORMAL LOW (ref 4.22–5.81)
RDW: 15.9 % — ABNORMAL HIGH (ref 11.5–15.5)
WBC: 4.2 10*3/uL (ref 4.0–10.5)
nRBC: 0 % (ref 0.0–0.2)

## 2019-02-05 MED ORDER — LOSARTAN POTASSIUM 50 MG PO TABS: 50.0000 mg | ORAL_TABLET | Freq: Every day | ORAL | 1 refills | Status: DC

## 2019-02-05 MED ORDER — DIGOXIN 125 MCG PO TABS: ORAL_TABLET | ORAL | 1 refills | Status: DC

## 2019-02-05 NOTE — Progress Notes (Signed)
PCP: Primary Cardiologist: Dr [name]  HPI: Jeremy Powers is a 63 y.o. male with a history of CAD with NSTEMI, s/p PCI to LAD in 2015 and 2016, s/p CABG 2017, systolic HF EF 20-25%, microcytic/iron deficiency anemia followed by hematology and GI, LBBB, and tobacco use.  In 2/20 he presented to [facility] ED with SOB, cough, and orthopnea in setting of med non-compliance. Was unable to afford copays with Medicaid. He was tachycardic and tachypneic on arrival, and was transferred to [facility]2/2/20for further evaluation. Echo performed which showed newly decreased EF of 25-30% from 50-55% in 12/2017. Taken for [facility] which showed severe 1 v disease with occluded LAD within previous stent. RCA and LCX normal. LIMA to LAD and SVG to D2 patent. Cath also showed low filling pressures with moderately low cardiac output. Meds adjusted as tolerated.  Today he returns for HF follow up. Says he is doing ok but not doing much. Denies CP. Gets SOB with moderate activity. Complains of intermittent orthopnea pr PND. Says he is taking all medications. Drinking 40oz beer every day. Sometimes 2. Smoking 1-2 cigarettes.  Echo today: EF 10% RV mild to moderately down Personally reviewed   RHC/LHC 06/24/2018 RA = 1 RV = 19/3 PA = 24/5 (14) PCW = 7 Fick cardiac output/index = 4.0/2.5 PVR = 1.9 WU Ao sat = 95% PA sat = 68%, 71% Assessment: 1. Severe 1-vessel CAD with occluded LAD within previous stent 2. Normal RCA and LCX 3. LIMA to LAD widely patent 4. SVG - D2 widely patent 5. SVG - D1 likely occluded (grafts not marked)  ECHO 06/2018 EF 25-30% LBBB CMRI 06/27/18 EF 24%. Normal RV     ROS: All systems negative except as listed in HPI, PMH and Problem List.  SH:  Social History   Socioeconomic History  . Marital status: Single    Spouse name: Not on file  . Number of children: Not on file  . Years of education: Not on file  . Highest education level: Not on file  Occupational History  . Not on  file  Social Needs  . Financial resource strain: Not on file  . Food insecurity    Worry: Not on file    Inability: Not on file  . Transportation needs    Medical: Not on file    Non-medical: Not on file  Tobacco Use  . Smoking status: Current Every Day Smoker    Packs/day: 0.50    Types: Cigarettes  . Smokeless tobacco: Never Used  Substance and Sexual Activity  . Alcohol use: Yes    Alcohol/week: 2.0 standard drinks    Types: 2 Cans of beer per week    Comment: 2 40oz beer daily  . Drug use: Yes    Types: Marijuana  . Sexual activity: Not on file  Lifestyle  . Physical activity    Days per week: Not on file    Minutes per session: Not on file  . Stress: Not on file  Relationships  . Social Musician on phone: Not on file    Gets together: Not on file    Attends religious service: Not on file    Active member of club or organization: Not on file    Attends meetings of clubs or organizations: Not on file    Relationship status: Not on file  . Intimate partner violence    Fear of current or ex partner: Not on file    Emotionally abused: Not  on file    Physically abused: Not on file    Forced sexual activity: Not on file  Other Topics Concern  . Not on file  Social History Narrative  . Not on file    FH: No family history on file.  Past Medical History:  Diagnosis Date  . Coronary artery disease   . Hypertension     Current Outpatient Medications  Medication Sig Dispense Refill  . albuterol (VENTOLIN HFA) 108 (90 Base) MCG/ACT inhaler Inhale 2 puffs into the lungs every 6 (six) hours as needed for wheezing or shortness of breath. 18 g 1  . aspirin EC 81 MG tablet Take 81 mg by mouth daily.    Marland Kitchen atorvastatin (LIPITOR) 40 MG tablet Take 1 tablet (40 mg total) by mouth daily at 6 PM. 30 tablet 5  . carvedilol (COREG) 6.25 MG tablet Take 1 tablet (6.25 mg total) by mouth 2 (two) times daily. 60 tablet 3  . ferrous sulfate 325 (65 FE) MG tablet Take 325  mg by mouth 2 (two) times daily with a meal.    . furosemide (LASIX) 40 MG tablet Take 1 tablet (40 mg total) by mouth as needed. Take as directed by the [facility] Failure Clinic 30 tablet 3  . losartan (COZAAR) 25 MG tablet Take 1 tablet (25 mg total) by mouth at bedtime. 30 tablet 5  . spironolactone (ALDACTONE) 25 MG tablet Take 1 tablet (25 mg total) by mouth at bedtime. 30 tablet 5   No current facility-administered medications for this encounter.     Vitals:   02/05/19 1505  BP: 131/64  Pulse: 73  SpO2: 98%  Weight: 62.1 kg (137 lb)    Wt Readings from Last 3 Encounters:  02/05/19 62.1 kg (137 lb)  02/04/19 62.6 kg (138 lb)  01/27/19 63.6 kg (140 lb 3.2 oz)     PHYSICAL EXAM: General:  Thin male. Walks slowly. + cough No resp difficulty HEENT: normal Neck: supple. no JVD. Carotids 2+ bilat; no bruits. No lymphadenopathy or thryomegaly appreciated. Cor: PMI nondisplaced. Regular rate & rhythm. No rubs, gallops or murmurs. Lungs: clear Abdomen: soft, nontender, nondistended. No hepatosplenomegaly. No bruits or masses. Good bowel sounds. Extremities: no cyanosis, clubbing, rash, edema Neuro: alert & orientedx3, cranial nerves grossly intact. moves all 4 extremities w/o difficulty. Affect pleasant    ASSESSMENT & PLAN: 1.Chronic Systolic Heart Failure, NICM ECHO 06/2018 EF 25-30% CMRI 06/27/2018 with EF 24% - Set up for repeat ECHO.   NYHAII-IIII. Volume status stable. He does not need diuretics.   -Increase carvedilol to 6.25 mg twice a day. esContinue carvedilol 3.125 mg twice a day.  -Continue dose losartan - Increase spironolactone to 25 mg daily.    Set up for repeat ECHO next visit.  -Check BMET today and in 7 days.   2. CAD - S/P CABG 2017, LHC 06/2018  with occluded LAD normal LCX and RCA. LIMA to LAD and SVG to D2 patent (SVG to D1 occluded) - no s/s angina - Continue aspirin 81 mg daily +atorvastatin.     3. LBBB  4.  Tobacco Abuse Discussed smoking  cessation.  5. HTN See above.    F/U 6 weeks with an ECHO   Glori Bickers MD 3:12 PM

## 2019-02-05 NOTE — Patient Instructions (Signed)
Labs done today. We will contact you only if your labs are abnormal.  START Digoxin 0.125mg (1 tab) by mouth once daily.  INCREASE Losartan 50mg (1 tab) by mouth once daily.   PLEASE remember to take one extra tablet of Lasix when you have increased shortness of breath.   Your physician recommends that you schedule a follow-up appointment in: 2 weeks with the Pharmacy Clinic and in 2 months with Dr. Haroldine Laws.   At the [facility], you and your health needs are our priority. As part of our continuing mission to provide you with exceptional heart care, we have created designated Provider Care Teams. These Care Teams include your primary Cardiologist (physician) and Advanced Practice Providers (APPs- Physician Assistants and Nurse Practitioners) who all work together to provide you with the care you need, when you need it.   You may see any of the following providers on your designated Care Team at your next follow up: Marland Kitchen Dr Glori Bickers . Dr Loralie Champagne . Darrick Grinder, NP   Please be sure to bring in all your medications bottles to every appointment.

## 2019-02-05 NOTE — Progress Notes (Signed)
ReDS Vest - 02/05/19 1500      ReDS Vest   Estimated volume prior to reading  Med    Fitting Posture  Sitting    Height Marker  Short    Ruler Value  28    Center Strip  Aligned    ReDS Value  28    Anatomical Comments  station C

## 2019-02-05 NOTE — Progress Notes (Signed)
  Echocardiogram 2D Echocardiogram has been performed.  Johny Chess 02/05/2019, 2:36 PM

## 2019-02-06 ENCOUNTER — Telehealth (HOSPITAL_COMMUNITY): Payer: Self-pay

## 2019-02-06 NOTE — Telephone Encounter (Signed)
-----   Message from Jolaine Artist, MD sent at 02/05/2019  7:05 PM EDT ----- BNP way up. I suspect ReDS may have been wrong. Can you ask him to double lasix to 80 daily. Recheck BMET and BNP 1 week.

## 2019-02-06 NOTE — Telephone Encounter (Signed)
Spoke to pt and family member to review labs. They verbalized understanding of med change for today only and appointment made for labs next week. No further questions at this time.

## 2019-02-10 ENCOUNTER — Telehealth (HOSPITAL_COMMUNITY): Payer: Self-pay

## 2019-02-10 NOTE — Telephone Encounter (Signed)
I called Jeremy Powers to schedule an appointment. He stated he would be available tomorrow anytime so we agreed to meet at 09:00.

## 2019-02-11 ENCOUNTER — Other Ambulatory Visit (HOSPITAL_COMMUNITY): Payer: Self-pay

## 2019-02-11 ENCOUNTER — Telehealth (HOSPITAL_COMMUNITY): Payer: Self-pay

## 2019-02-11 MED ORDER — FUROSEMIDE 40 MG PO TABS: 40.0000 mg | ORAL_TABLET | Freq: Every day | ORAL | 3 refills | Status: DC

## 2019-02-11 MED ORDER — FERROUS SULFATE 325 (65 FE) MG PO TABS: 325.0000 mg | ORAL_TABLET | Freq: Two times a day (BID) | ORAL | 3 refills | Status: DC

## 2019-02-11 NOTE — Progress Notes (Signed)
Paramedicine Encounter    Patient ID: Jeremy Powers, male    DOB: [birth year], 63 y.o.   MRN: [id number]   Patient Care Team: Inc, [facility] as PCP - General (Pediatrics)  Patient Active Problem List   Diagnosis Date Noted  . Acute systolic (congestive) heart failure ([place]) 06/24/2018  . Aortic insufficiency 06/22/2018  . Acute on chronic congestive heart failure ([place]) 06/22/2018  . Elevated troponin 06/22/2018  . CAD (coronary artery disease) 06/22/2018  . Left bundle branch block 06/22/2018  . History of iron deficiency anemia 06/22/2018  . Acute on chronic heart failure ([place]) 06/22/2018    Current Outpatient Medications:  .  albuterol (VENTOLIN HFA) 108 (90 Base) MCG/ACT inhaler, Inhale 2 puffs into the lungs every 6 (six) hours as needed for wheezing or shortness of breath., Disp: 18 g, Rfl: 1 .  aspirin EC 81 MG tablet, Take 81 mg by mouth daily., Disp: , Rfl:  .  atorvastatin (LIPITOR) 40 MG tablet, Take 1 tablet (40 mg total) by mouth daily at 6 PM., Disp: 30 tablet, Rfl: 5 .  carvedilol (COREG) 6.25 MG tablet, Take 1 tablet (6.25 mg total) by mouth 2 (two) times daily., Disp: 60 tablet, Rfl: 3 .  furosemide (LASIX) 40 MG tablet, Take 1 tablet (40 mg total) by mouth as needed. Take as directed by the [facility] Failure Clinic, Disp: 30 tablet, Rfl: 3 .  losartan (COZAAR) 50 MG tablet, Take 1 tablet (50 mg total) by mouth at bedtime., Disp: 90 tablet, Rfl: 1 .  spironolactone (ALDACTONE) 25 MG tablet, Take 1 tablet (25 mg total) by mouth at bedtime., Disp: 30 tablet, Rfl: 5 .  digoxin (LANOXIN) 0.125 MG tablet, Take 1 tablet by mouth daily, Disp: 90 tablet, Rfl: 1 .  ferrous sulfate 325 (65 FE) MG tablet, Take 325 mg by mouth 2 (two) times daily with a meal., Disp: , Rfl:  No Known Allergies    Social History   Socioeconomic History  . Marital status: Single    Spouse name: Not on file  . Number of children: Not on file  . Years of education: Not on file   . Highest education level: Not on file  Occupational History  . Not on file  Social Needs  . Financial resource strain: Not on file  . Food insecurity    Worry: Not on file    Inability: Not on file  . Transportation needs    Medical: Not on file    Non-medical: Not on file  Tobacco Use  . Smoking status: Current Every Day Smoker    Packs/day: 0.50    Types: Cigarettes  . Smokeless tobacco: Never Used  Substance and Sexual Activity  . Alcohol use: Yes    Alcohol/week: 2.0 standard drinks    Types: 2 Cans of beer per week    Comment: 2 40oz beer daily  . Drug use: Yes    Types: Marijuana  . Sexual activity: Not on file  Lifestyle  . Physical activity    Days per week: Not on file    Minutes per session: Not on file  . Stress: Not on file  Relationships  . Social Herbalist on phone: Not on file    Gets together: Not on file    Attends religious service: Not on file    Active member of club or organization: Not on file    Attends meetings of clubs or organizations: Not on file  Relationship status: Not on file  . Intimate partner violence    Fear of current or ex partner: Not on file    Emotionally abused: Not on file    Physically abused: Not on file    Forced sexual activity: Not on file  Other Topics Concern  . Not on file  Social History Narrative  . Not on file    Physical Exam Cardiovascular:     Rate and Rhythm: Normal rate and regular rhythm.     Pulses: Normal pulses.  Pulmonary:     Effort: Pulmonary effort is normal.     Breath sounds: Examination of the right-lower field reveals rhonchi. Examination of the left-lower field reveals rhonchi. Rhonchi present.  Abdominal:     General: There is no distension.  Musculoskeletal: Normal range of motion.     Right lower leg: Edema present.     Left lower leg: Edema present.  Skin:    General: Skin is warm and dry.     Capillary Refill: Capillary refill takes less than 2 seconds.   Neurological:     Mental Status: He is alert and oriented to person, place, and time.  Psychiatric:        Mood and Affect: Mood normal.         Future Appointments  Date Time Provider Department Center  02/13/2019  9:00 AM [facility] LAB [facility] None  02/19/2019  1:30 PM [facility] PHARMACY [facility] None  05/08/2019  2:20 PM Bensimhon, Bevelyn Buckles, MD [facility] None    BP 138/74 (BP Location: Left Arm, Patient Position: Sitting, Cuff Size: Normal)   Pulse 80   Resp 18   Wt 140 lb 9.6 oz (63.8 kg)   SpO2 96%   BMI 22.69 kg/m   Weight yesterday- 141.2 lb Last visit weight- 137 lb  Mr Capizzi was seen at home today and reported feeling generally well. He denied chest pain, headache, dizziness, fever or cough since our last visit and advised that his orthopnea has improved since taking lasix over the weekend but he did not continue it into the week. He also stated that he has not stopped drinking alcohol or smoking cigarettes but advised he has cut back on drinking. When pressed about it he stated he is drinking "one beer per day" but upon further questioning he admitted to the "one beer" being a 40 oz. I explained the importance of abstaining from alcohol and he stated he would stop today. His medications were verified and his pillbox was refilled. He had not picked up his digoxin because he is unable to afford it. I will buy it for him and return it later today. I explained this to Mr Allman and he was understanding and agreeable. Additionally, I labeled his lasix bottle with a sticker and wrote in red marker "Trouble Breathing? Take 1 pill." I explained that this is for if he is having trouble breathing when he lays down and he expressed understanding. I will follow up later after retrieving his medication from the pharmacy.   Jacqualine Code, EMT 02/11/19  ACTION: Home visit completed Next visit planned for 1 week

## 2019-02-11 NOTE — Progress Notes (Signed)
I returned to Jeremy Powers' apartment to deliver digoxin and add it to his pillbox. Her was not home so I made the changes and told his room mate. I will follow up next week unless it become necessary sooner.

## 2019-02-11 NOTE — Telephone Encounter (Signed)
Talked to zach emt-p. Dr. Haroldine Laws had changed pt's furosemide to 40mg  daily with 40mg  prn. Wanted it changed in the system and wanted a refill on pt's iron pill. Orders placed. Pt not able to come to lab appointment and is agreeable to have lab work drawn at [facility] appt 10/1.

## 2019-02-13 ENCOUNTER — Other Ambulatory Visit (HOSPITAL_COMMUNITY): Payer: Medicare Other

## 2019-02-17 ENCOUNTER — Telehealth (HOSPITAL_COMMUNITY): Payer: Self-pay

## 2019-02-17 NOTE — Telephone Encounter (Signed)
I called Jeremy Powers to schedule an appointment for tomorrow and remind him of his appointment at the clinic on Thursday. Unfortunately his phone did went directly to a message which stated he was unable to accept calls at this time. I will reach out again tomorrow.

## 2019-02-17 NOTE — Telephone Encounter (Signed)
I called Mr Jeremy Powers to schedule an appointment. He stated he would be home all day tomorrow so we agreed to meet at 11:00.

## 2019-02-18 ENCOUNTER — Other Ambulatory Visit (HOSPITAL_COMMUNITY): Payer: Self-pay

## 2019-02-18 NOTE — Progress Notes (Signed)
Paramedicine Encounter    Patient ID: Jeremy Powers, male    DOB: 10-05-1955, 63 y.o.   MRN: [id number]   Patient Care Team: [facility], [facility] as PCP - General (Pediatrics)  Patient Active Problem List   Diagnosis Date Noted  . Acute systolic (congestive) heart failure ([place]) 06/24/2018  . Aortic insufficiency 06/22/2018  . Acute on chronic congestive heart failure ([place]) 06/22/2018  . Elevated troponin 06/22/2018  . CAD (coronary artery disease) 06/22/2018  . Left bundle branch block 06/22/2018  . History of iron deficiency anemia 06/22/2018  . Acute on chronic heart failure ([place]) 06/22/2018    Current Outpatient Medications:  .  albuterol (VENTOLIN HFA) 108 (90 Base) MCG/ACT inhaler, Inhale 2 puffs into the lungs every 6 (six) hours as needed for wheezing or shortness of breath., Disp: 18 g, Rfl: 1 .  aspirin EC 81 MG tablet, Take 81 mg by mouth daily., Disp: , Rfl:  .  atorvastatin (LIPITOR) 40 MG tablet, Take 1 tablet (40 mg total) by mouth daily at 6 PM., Disp: 30 tablet, Rfl: 5 .  carvedilol (COREG) 6.25 MG tablet, Take 1 tablet (6.25 mg total) by mouth 2 (two) times daily., Disp: 60 tablet, Rfl: 3 .  digoxin (LANOXIN) 0.125 MG tablet, Take 1 tablet by mouth daily, Disp: 90 tablet, Rfl: 1 .  furosemide (LASIX) 40 MG tablet, Take 1 tablet (40 mg total) by mouth daily. May take an additional 1 tab (40mg ) daily as needed for swelling., Disp: 30 tablet, Rfl: 3 .  losartan (COZAAR) 50 MG tablet, Take 1 tablet (50 mg total) by mouth at bedtime., Disp: 90 tablet, Rfl: 1 .  spironolactone (ALDACTONE) 25 MG tablet, Take 1 tablet (25 mg total) by mouth at bedtime., Disp: 30 tablet, Rfl: 5 .  ferrous sulfate 325 (65 FE) MG tablet, Take 1 tablet (325 mg total) by mouth 2 (two) times daily with a meal. (Patient not taking: Reported on 02/18/2019), Disp: 180 tablet, Rfl: 3 No Known Allergies    Social History   Socioeconomic History  . Marital status: Single    Spouse  name: Not on file  . Number of children: Not on file  . Years of education: Not on file  . Highest education level: Not on file  Occupational History  . Not on file  Social Needs  . Financial resource strain: Not on file  . Food insecurity    Worry: Not on file    Inability: Not on file  . Transportation needs    Medical: Not on file    Non-medical: Not on file  Tobacco Use  . Smoking status: Current Every Day Smoker    Packs/day: 0.50    Types: Cigarettes  . Smokeless tobacco: Never Used  Substance and Sexual Activity  . Alcohol use: Yes    Alcohol/week: 2.0 standard drinks    Types: 2 Cans of beer per week    Comment: 2 40oz beer daily  . Drug use: Yes    Types: Marijuana  . Sexual activity: Not on file  Lifestyle  . Physical activity    Days per week: Not on file    Minutes per session: Not on file  . Stress: Not on file  Relationships  . Social Herbalist on phone: Not on file    Gets together: Not on file    Attends religious service: Not on file    Active member of club or organization: Not on  file    Attends meetings of clubs or organizations: Not on file    Relationship status: Not on file  . Intimate partner violence    Fear of current or ex partner: Not on file    Emotionally abused: Not on file    Physically abused: Not on file    Forced sexual activity: Not on file  Other Topics Concern  . Not on file  Social History Narrative  . Not on file    Physical Exam Cardiovascular:     Rate and Rhythm: Normal rate and regular rhythm.     Pulses: Normal pulses.  Pulmonary:     Effort: Pulmonary effort is normal.     Breath sounds: Examination of the right-upper field reveals wheezing. Examination of the right-lower field reveals wheezing. Wheezing present.  Abdominal:     General: Abdomen is flat.  Musculoskeletal: Normal range of motion.     Right lower leg: No edema.     Left lower leg: No edema.  Skin:    General: Skin is warm and dry.      Capillary Refill: Capillary refill takes less than 2 seconds.  Neurological:     Mental Status: He is alert and oriented to person, place, and time.  Psychiatric:        Mood and Affect: Mood normal.         Future Appointments  Date Time Provider [place]  02/19/2019  1:30 PM [facility] PHARMACY [facility] None  05/08/2019  2:20 PM Bensimhon, Shaune Pascal, MD [facility] None    BP 114/62 (BP Location: Left Arm, Patient Position: Sitting, Cuff Size: Normal)   Pulse 70   Resp 16   Wt 132 lb 9.6 oz (60.1 kg)   SpO2 96%   BMI 21.40 kg/m   Weight yesterday- 134.8 lb Last visit weight- 140.6 lb  Mr Kau was seen at home today and reported feeling well. He denied chest pain, increased SOB, headache, dizziness, orthopnea, fever or cough since our last visit. He stated he is walking 2 laps around his apartment complex per day and does not get SOB but does get SOB when walking around the apartment. He stated he is able to breathe better when he walks for longer periods of time. He said he has not stopped drinking alcohol but will stop today and he is down to smoking one cigarette per day. His medications were verified and his pillbox was refilled. I will follow up in two weeks when I return from vacation.   [name], EMT 02/18/19  ACTION: Home visit completed Next visit planned for 2 weeks

## 2019-02-19 ENCOUNTER — Inpatient Hospital Stay (HOSPITAL_COMMUNITY): Admission: RE | Admit: 2019-02-19 | Payer: Medicare Other | Source: Ambulatory Visit

## 2019-03-04 ENCOUNTER — Other Ambulatory Visit (HOSPITAL_COMMUNITY): Payer: Self-pay

## 2019-03-04 NOTE — Progress Notes (Signed)
Paramedicine Encounter    Patient ID: Jeremy Powers, male    DOB: 10-05-1955, 63 y.o.   MRN: [id number]   Patient Care Team: Inc, [facility] as PCP - General (Pediatrics)  Patient Active Problem List   Diagnosis Date Noted  . Acute systolic (congestive) heart failure ([place]) 06/24/2018  . Aortic insufficiency 06/22/2018  . Acute on chronic congestive heart failure ([place]) 06/22/2018  . Elevated troponin 06/22/2018  . CAD (coronary artery disease) 06/22/2018  . Left bundle branch block 06/22/2018  . History of iron deficiency anemia 06/22/2018  . Acute on chronic heart failure ([place]) 06/22/2018    Current Outpatient Medications:  .  albuterol (VENTOLIN HFA) 108 (90 Base) MCG/ACT inhaler, Inhale 2 puffs into the lungs every 6 (six) hours as needed for wheezing or shortness of breath., Disp: 18 g, Rfl: 1 .  aspirin EC 81 MG tablet, Take 81 mg by mouth daily., Disp: , Rfl:  .  atorvastatin (LIPITOR) 40 MG tablet, Take 1 tablet (40 mg total) by mouth daily at 6 PM., Disp: 30 tablet, Rfl: 5 .  carvedilol (COREG) 6.25 MG tablet, Take 1 tablet (6.25 mg total) by mouth 2 (two) times daily., Disp: 60 tablet, Rfl: 3 .  digoxin (LANOXIN) 0.125 MG tablet, Take 1 tablet by mouth daily, Disp: 90 tablet, Rfl: 1 .  furosemide (LASIX) 40 MG tablet, Take 1 tablet (40 mg total) by mouth daily. May take an additional 1 tab (40mg ) daily as needed for swelling., Disp: 30 tablet, Rfl: 3 .  losartan (COZAAR) 50 MG tablet, Take 1 tablet (50 mg total) by mouth at bedtime., Disp: 90 tablet, Rfl: 1 .  spironolactone (ALDACTONE) 25 MG tablet, Take 1 tablet (25 mg total) by mouth at bedtime., Disp: 30 tablet, Rfl: 5 .  ferrous sulfate 325 (65 FE) MG tablet, Take 1 tablet (325 mg total) by mouth 2 (two) times daily with a meal. (Patient not taking: Reported on 02/18/2019), Disp: 180 tablet, Rfl: 3 No Known Allergies    Social History   Socioeconomic History  . Marital status: Single    Spouse  name: Not on file  . Number of children: Not on file  . Years of education: Not on file  . Highest education level: Not on file  Occupational History  . Not on file  Social Needs  . Financial resource strain: Not on file  . Food insecurity    Worry: Not on file    Inability: Not on file  . Transportation needs    Medical: Not on file    Non-medical: Not on file  Tobacco Use  . Smoking status: Current Every Day Smoker    Packs/day: 0.50    Types: Cigarettes  . Smokeless tobacco: Never Used  Substance and Sexual Activity  . Alcohol use: Yes    Alcohol/week: 2.0 standard drinks    Types: 2 Cans of beer per week    Comment: 2 40oz beer daily  . Drug use: Yes    Types: Marijuana  . Sexual activity: Not on file  Lifestyle  . Physical activity    Days per week: Not on file    Minutes per session: Not on file  . Stress: Not on file  Relationships  . Social Herbalist on phone: Not on file    Gets together: Not on file    Attends religious service: Not on file    Active member of club or organization: Not on  file    Attends meetings of clubs or organizations: Not on file    Relationship status: Not on file  . Intimate partner violence    Fear of current or ex partner: Not on file    Emotionally abused: Not on file    Physically abused: Not on file    Forced sexual activity: Not on file  Other Topics Concern  . Not on file  Social History Narrative  . Not on file    Physical Exam Cardiovascular:     Rate and Rhythm: Normal rate and regular rhythm.     Pulses: Normal pulses.  Pulmonary:     Effort: Pulmonary effort is normal.  Abdominal:     General: There is no distension.  Musculoskeletal: Normal range of motion.     Right lower leg: No edema.     Left lower leg: No edema.  Skin:    General: Skin is warm and dry.     Capillary Refill: Capillary refill takes less than 2 seconds.  Neurological:     Mental Status: He is alert and oriented to person,  place, and time.  Psychiatric:        Mood and Affect: Mood normal.         Future Appointments  Date Time Provider Department Center  03/05/2019  2:30 PM [facility] None  05/08/2019  2:20 PM Bensimhon, Bevelyn Buckles, MD [facility] None    BP 130/64 (BP Location: Left Arm, Patient Position: Sitting, Cuff Size: Normal)   Pulse 64   Resp 16   Wt 133 lb 12.8 oz (60.7 kg)   SpO2 97%   BMI 21.60 kg/m   Weight yesterday- 137.2 lb Last visit weight- 132 lb  [name] was seen at home today and reported feeling well. He denied chest pain, SOB, headache, dizziness, orthopnea, fever or cough since our last visit. He reported being compliant with his medications however he had failed to take an entire week of medication which I left him in a box for while I was gone. I asked him about this and he said he "has a lot on his mind" which led to him not taking medications. I continue to stress to importance of medication compliance and he said he would "get back on track." He has taken medications prior to my arrival this morning so that slot was refilled and I will follow up next week.   Jacqualine Code, EMT 03/04/19  ACTION: Home visit completed Next visit planned for 1 week

## 2019-03-05 ENCOUNTER — Other Ambulatory Visit: Payer: Self-pay

## 2019-03-05 ENCOUNTER — Ambulatory Visit (HOSPITAL_COMMUNITY)
Admission: RE | Admit: 2019-03-05 | Discharge: 2019-03-05 | Disposition: A | Discharge status: Home / Self Care | Payer: Medicare Other | Source: Ambulatory Visit | Attending: Cardiology | Admitting: Cardiology

## 2019-03-05 ENCOUNTER — Telehealth (HOSPITAL_COMMUNITY): Payer: Self-pay | Admitting: Pharmacist

## 2019-03-05 VITALS — BP 134/70 | HR 61 | Wt 130.6 lb

## 2019-03-05 DIAGNOSIS — I110 Hypertensive heart disease with heart failure: Secondary | ICD-10-CM | POA: Insufficient documentation

## 2019-03-05 DIAGNOSIS — I447 Left bundle-branch block, unspecified: Secondary | ICD-10-CM | POA: Insufficient documentation

## 2019-03-05 DIAGNOSIS — I5022 Chronic systolic (congestive) heart failure: Secondary | ICD-10-CM

## 2019-03-05 DIAGNOSIS — Z7982 Long term (current) use of aspirin: Secondary | ICD-10-CM | POA: Insufficient documentation

## 2019-03-05 DIAGNOSIS — I255 Ischemic cardiomyopathy: Secondary | ICD-10-CM | POA: Diagnosis not present

## 2019-03-05 DIAGNOSIS — F17210 Nicotine dependence, cigarettes, uncomplicated: Secondary | ICD-10-CM | POA: Diagnosis not present

## 2019-03-05 DIAGNOSIS — I2510 Atherosclerotic heart disease of native coronary artery without angina pectoris: Secondary | ICD-10-CM | POA: Diagnosis not present

## 2019-03-05 DIAGNOSIS — Z951 Presence of aortocoronary bypass graft: Secondary | ICD-10-CM | POA: Insufficient documentation

## 2019-03-05 DIAGNOSIS — Z79899 Other long term (current) drug therapy: Secondary | ICD-10-CM | POA: Insufficient documentation

## 2019-03-05 LAB — BASIC METABOLIC PANEL
Anion gap: 10 (ref 5–15)
BUN: 12 mg/dL (ref 8–23)
CO2: 24 mmol/L (ref 22–32)
Calcium: 8.8 mg/dL — ABNORMAL LOW (ref 8.9–10.3)
Chloride: 102 mmol/L (ref 98–111)
Creatinine, Ser: 0.88 mg/dL (ref 0.61–1.24)
GFR calc Af Amer: >60 mL/min (ref >60)
GFR calc non Af Amer: >60 mL/min (ref >60)
Glucose, Bld: 93 mg/dL (ref 70–99)
Potassium: 4.1 mmol/L (ref 3.5–5.1)
Sodium: 136 mmol/L (ref 135–145)

## 2019-03-05 LAB — BRAIN NATRIURETIC PEPTIDE: B Natriuretic Peptide: 2078.6 pg/mL — ABNORMAL HIGH (ref 0.0–100.0)

## 2019-03-05 MED ORDER — SACUBITRIL-VALSARTAN 24-26 MG PO TABS: 1.0000 | ORAL_TABLET | Freq: Two times a day (BID) | ORAL | 6 refills | Status: DC

## 2019-03-05 MED ORDER — FERROUS SULFATE 325 (65 FE) MG PO TABS: 325.0000 mg | ORAL_TABLET | Freq: Two times a day (BID) | ORAL | 3 refills | Status: DC

## 2019-03-05 MED ORDER — FUROSEMIDE 40 MG PO TABS: 40.0000 mg | ORAL_TABLET | Freq: Every day | ORAL | 3 refills | Status: DC | PRN

## 2019-03-05 NOTE — Telephone Encounter (Addendum)
Patient Advocate Encounter   Received notification from Medicaid that prior authorization for Delene Loll is required.   PA submitted on [place] [employer] Recipient ID: [id number] O Confirmation Number: [id number] W Status is pending   Will continue to follow.  Audry Riles, PharmD, BCPS, CPP Heart Failure Clinic Pharmacist [phone]

## 2019-03-05 NOTE — Patient Instructions (Signed)
It was a pleasure seeing you today!  MEDICATIONS: -We are changing your medications today -Stop taking Losartan and start taking Entresto 24/26 mg (1 tab) twice daily. -Keep taking furosemide as needed for swelling. -Call if you have questions about your medications.  LABS: -We will call you if your labs need attention.  NEXT APPOINTMENT: Return to clinic in 3 weeks with Pharmacy Clinic.  In general, to take care of your heart failure: -Limit your fluid intake to 2 Liters (half-gallon) per day.   -Limit your salt intake to ideally 2-3 grams (2000-3000 mg) per day. -Weigh yourself daily and record, and bring that "weight diary" to your next appointment.  (Weight gain of 2-3 pounds in 1 day typically means fluid weight.) -The medications for your heart are to help your heart and help you live longer.   -Please contact us before stopping any of your heart medications.  Call the clinic at [phone] with questions or to reschedule future appointments.

## 2019-03-06 ENCOUNTER — Telehealth (HOSPITAL_COMMUNITY): Payer: Self-pay | Admitting: Pharmacist

## 2019-03-06 ENCOUNTER — Other Ambulatory Visit (HOSPITAL_COMMUNITY): Payer: Self-pay

## 2019-03-06 NOTE — Progress Notes (Signed)
I stopped by Jeremy Powers' home today to amend his pillbox following his pharmacy visit yesterday. I removed losartan from his box and added Entresto. Additionally he had picked up ferrous sulfate so I added that to his pillbox as well. I will follow up next week.   [name], EMT 03/06/19

## 2019-03-06 NOTE — Telephone Encounter (Signed)
Advanced Heart Failure Patient Advocate Encounter  Prior Authorization for Jeremy Powers has been approved.    PA# [id number] Effective dates: 03/05/2019 - 02/28/2020  Patients co-pay is $3.00  Audry Riles, PharmD, BCPS, CPP Heart Failure Clinic Pharmacist [phone]

## 2019-03-08 NOTE — Progress Notes (Signed)
PCP: Primary Cardiologist: Dr Haroldine Laws  HPI:  Jeremy Powers is a 63 y.o. 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   In 2/20 he presented to [facility] ED with SOB, cough, and orthopnea in setting of med non-compliance. Was unable to afford copays with Medicaid. He was tachycardic and tachypneic on arrival, and was transferred to [facility] 06/22/18 for further evaluation. Echo performed which showed newly decreased EF of 25-30% from 50-55% in 12/2017.  Taken for [facility] which showed severe 1 v disease with occluded LAD within previous stent. RCA and LCX normal. LIMA to LAD and SVG to D2 patent. Cath also showed low filling pressures with moderately low cardiac output. Meds adjusted as tolerated.   Echo 02/05/19 showed EF 20-25%   Recently presented to HF Clinic for follow-up with Dr. Haroldine Laws. He reported he was doing ok but not doing much. Denied CP. Complained of SOB with moderate activity and intermittent orthopnea and PND. Reported he was taking all medications. He stated he drinks one 40oz beer every day, sometimes two. Smoking 1-2 cigarettes per day.  Today he returns to HF clinic for pharmacist medication titration. At last visit with MD, losartan was increased to 50 mg daily and digoxin 0.125 mg was added. Overall he is feeling well today. He stated he has some dizziness, which is infrequent (~1x/week) and mainly occurs after exercise. No chest pains or palpitations. He feels like his breathing is better. He only gets SOB if he "walks too fast". He can complete all ADLs. He does not weigh himself daily at home. He has only been taking furosemide 40 mg as needed, not daily. He last took this 2 weeks ago when he had ankle swelling. He also takes it if he has "trouble breathing". No lower extremity edema, PND or orthopnea. ReDS reading 33% today. His appetite is  good. He does not follow a low salt diet. At last paramedicine visit yesterday, it was documented that he had missed an entire week of medication, even though he stated compliance. He stated it was because he was stressed and anxious about his children, and that made him forget. We discussed reminder techniques, such as using the alarm on his cell phone, to help him remember his daily medications.    . Shortness of breath/dyspnea on exertion? yes - but only when walking fast or long distances . Orthopnea/PND? no . Edema? no . Lightheadedness/dizziness? Some dizziness, but occurs after exercise . Daily weights at home? no . Blood pressure/heart rate monitoring at home? no . Following low-sodium/fluid-restricted diet? no  HF Medications: Carvedilol 6.25 mg twice daily Losartan 50 mg daily Spironolactone 25 mg daily Digoxin 0.125 mg daily Furosemide 40 mg daily and 40 mg prn - Stated only taking furosemide 40 mg PRN  Has the patient been experiencing any side effects to the medications prescribed?  no  Does the patient have any problems obtaining medications due to transportation or finances?   Yes - has [place] Medicaid but unable to afford copays. His copays are waived at his [employer].   Understanding of regimen: poor Understanding of indications: poor Potential of compliance: poor - we discussed tools to increase adherence (I.e. cell phone alarms) Patient understands to avoid NSAIDs. Patient understands to avoid decongestants.    Pertinent Lab Values: . Serum creatinine 0.88, BUN 12, Potassium 4.1, Sodium  136, BNP 2,078 (previously >4500 on 02/05/19),  Digoxin - no level, took medication this AM   Vital Signs: . Weight: 130.6 lbs (last clinic weight: 133.8 lbs) . Blood pressure: 134/70  . Heart rate: 61   Assessment: 1.  Chronic Systolic Heart Failure, ICM ECHO 06/2018 EF 25-30% CMRI 06/27/2018 with EF 24% - Echo 02/05/19 showed EF 20-25% - NYHA II-IIII. Euvolemic on exam.  ReDS 33%.   - Vitals: BP 134/70, HR 61 - Labs stable: Scr 0.88, K 4.1. BNP decreased from >4500 to 2078 pg/mL.  -Continue furosemide 40 mg PRN.   -Continue carvedilol 6.25 mg twice a day. -Stop losartan and start Entresto 24/26 mg BID. Repeat BMET in 2-3 weeks. Prior Authorization for [employer] submitted through [employer]. Informed Zack (paramedicine) of change.  -Continue spironolactone 25 mg daily.  -Continue digoxin 0.125 mcg daily. Plan to obtain digoxin level at next visit (had taken dose today).    -Although he would be a good candidate for Bidil, medication compliance concerns may limit use. If started, will need significant education and positive reinforcement.    2. CAD - S/P CABG 2017, LHC 06/2018  with occluded LAD normal LCX and RCA. LIMA to LAD and SVG to D2 patent (SVG to D1 occluded) - no s/s angina - Continue aspirin 81 mg daily +atorvastatin.      3. LBBB   4.  Tobacco Abuse - Discussed smoking cessation.   5. HTN - BP slightly elevated today -Start Entresto and continue carvedilol and spironolactone as above.    Plan: 1) Medication changes: Based on clinical presentation, vital signs and recent labs will Stop Losartan and start Entresto 24/26 mg BID. Changed furosemide to 40 mg PRN to reflect current usage.  2) Labs: Scr 0.88, K 4.1, BNP 2078 3) Follow-up: Pharmacy Clinic visit in 3 weeks   Karle Plumber, PharmD, BCPS, CPP Heart Failure Clinic Pharmacist [phone]   Patient discussed with Dr. Marden Noble.  Agree with assessment and plan.   Will continue to work with paramedicine to enforce compliance.  Leota Sauers Pharm.D. CPP, BCPS Clinical Pharmacist [phone] 03/08/2019 3:17 PM

## 2019-03-10 ENCOUNTER — Other Ambulatory Visit (HOSPITAL_COMMUNITY): Payer: Self-pay | Admitting: Student

## 2019-03-11 ENCOUNTER — Other Ambulatory Visit (HOSPITAL_COMMUNITY): Payer: Self-pay

## 2019-03-11 NOTE — Progress Notes (Signed)
Paramedicine Encounter    Patient ID: Jeremy Powers, male    DOB: [birth year], 63 y.o.   MRN: [id number]   Patient Care Team: [facility], [facility] as PCP - General (Pediatrics)  Patient Active Problem List   Diagnosis Date Noted  . Acute systolic (congestive) heart failure ([facility]) 06/24/2018  . Aortic insufficiency 06/22/2018  . Acute on chronic congestive heart failure ([facility]) 06/22/2018  . Elevated troponin 06/22/2018  . CAD (coronary artery disease) 06/22/2018  . Left bundle branch block 06/22/2018  . History of iron deficiency anemia 06/22/2018  . Acute on chronic heart failure ([facility]) 06/22/2018    Current Outpatient Medications:  .  albuterol (VENTOLIN HFA) 108 (90 Base) MCG/ACT inhaler, Inhale 2 puffs into the lungs every 6 (six) hours as needed for wheezing or shortness of breath., Disp: 18 g, Rfl: 1 .  aspirin EC 81 MG tablet, Take 81 mg by mouth daily., Disp: , Rfl:  .  atorvastatin (LIPITOR) 40 MG tablet, TAKE 1 TABLET BY MOUTH DAILY AT 6 PM, Disp: 30 tablet, Rfl: 5 .  carvedilol (COREG) 6.25 MG tablet, Take 1 tablet (6.25 mg total) by mouth 2 (two) times daily., Disp: 60 tablet, Rfl: 3 .  digoxin (LANOXIN) 0.125 MG tablet, Take 1 tablet by mouth daily, Disp: 90 tablet, Rfl: 1 .  ferrous sulfate 325 (65 FE) MG tablet, Take 1 tablet (325 mg total) by mouth 2 (two) times daily with a meal., Disp: 180 tablet, Rfl: 3 .  furosemide (LASIX) 40 MG tablet, Take 1 tablet (40 mg total) by mouth daily as needed. Take 40 mg (1 tab) as needed for swelling., Disp: 30 tablet, Rfl: 3 .  sacubitril-valsartan (ENTRESTO) 24-26 MG, Take 1 tablet by mouth 2 (two) times daily., Disp: 60 tablet, Rfl: 6 .  spironolactone (ALDACTONE) 25 MG tablet, Take 1 tablet (25 mg total) by mouth at bedtime., Disp: 30 tablet, Rfl: 5 No Known Allergies    Social History   Socioeconomic History  . Marital status: Single    Spouse name: Not on file  . Number of children: Not on file  . Years of  education: Not on file  . Highest education level: Not on file  Occupational History  . Not on file  Social Needs  . Financial resource strain: Not on file  . Food insecurity    Worry: Not on file    Inability: Not on file  . Transportation needs    Medical: Not on file    Non-medical: Not on file  Tobacco Use  . Smoking status: Current Every Day Smoker    Packs/day: 0.50    Types: Cigarettes  . Smokeless tobacco: Never Used  Substance and Sexual Activity  . Alcohol use: Yes    Alcohol/week: 2.0 standard drinks    Types: 2 Cans of beer per week    Comment: 2 40oz beer daily  . Drug use: Yes    Types: Marijuana  . Sexual activity: Not on file  Lifestyle  . Physical activity    Days per week: Not on file    Minutes per session: Not on file  . Stress: Not on file  Relationships  . Social Musician on phone: Not on file    Gets together: Not on file    Attends religious service: Not on file    Active member of club or organization: Not on file    Attends meetings of clubs or organizations: Not on  file    Relationship status: Not on file  . Intimate partner violence    Fear of current or ex partner: Not on file    Emotionally abused: Not on file    Physically abused: Not on file    Forced sexual activity: Not on file  Other Topics Concern  . Not on file  Social History Narrative  . Not on file    Physical Exam Cardiovascular:     Rate and Rhythm: Normal rate and regular rhythm.     Pulses: Normal pulses.  Pulmonary:     Effort: Pulmonary effort is normal.     Breath sounds: Normal breath sounds.  Abdominal:     General: There is no distension.  Musculoskeletal: Normal range of motion.     Right lower leg: No edema.     Left lower leg: No edema.  Skin:    General: Skin is warm and dry.     Capillary Refill: Capillary refill takes less than 2 seconds.  Neurological:     Mental Status: He is alert and oriented to person, place, and time.   Psychiatric:        Mood and Affect: Mood normal.         Future Appointments  Date Time Provider [place]  03/23/2019  2:00 PM [facility] None  05/08/2019  2:20 PM Bensimhon, Shaune Pascal, MD [facility] None    BP 124/78 (BP Location: Left Arm, Patient Position: Sitting, Cuff Size: Normal)   Pulse 62   Resp 18   Wt 134 lb 12.8 oz (61.1 kg)   SpO2 94%   BMI 21.76 kg/m   Weight yesterday- 130.4 lb Last visit weight- 130.6 lb  Mr Jeremy Powers was seen at home today and reported feeling well. He denied chest pain, SOB, headache, dizziness, orthopnea, fever or cough since our last appointment. He stated he has been compliant with his medications which appeared to be true given the empty pillbox he presented to me. His weight was elevated 4 lb since yesterday but his diet has not been compliant with low sodium. I advised his to reduce his salt intake and he was understanding and agreeable. He is still drinking alcohol but has reduced to drinking a 16 oz every other day which is down from a 40 oz daily. He also stated he is still smoking but only "2 pulls from a cigarette per day." I have encouraged him to completely stop drinking and smoking and he is understanding and is trying. His medications were verified and his pillbox was refilled. I contacted [facility] and had carvedilol and spironolactone transferred from [facility] pharmacy. These medications should be ready for pickup after 12:00. I will follow up with Mr Jeremy Powers in the next couple of days to ensure he has picked up his prescriptions.   Jeremy Powers, EMT 03/11/19  ACTION: Home visit completed Next visit planned for 1 week

## 2019-03-12 ENCOUNTER — Other Ambulatory Visit (HOSPITAL_COMMUNITY): Payer: Self-pay

## 2019-03-12 NOTE — Progress Notes (Signed)
Jeremy Powers was seen at home today after picking up his medication from the pharmacy. Due to literacy issuses he is unable to add medications to his pillbox reliably. Upon adding the carvedilol to his pillbox, I noted that he did not take his medication last night or this morning. When asked about this he stated "I felt OK so I didn't take it.:" I explained the importance of taking his medications everyday as prescribed. This has become an ongoing issue with Jeremy Mimbs which I am unclear on how to resolve. I will follow up next week.  [name], EMT 03/12/19

## 2019-03-17 ENCOUNTER — Telehealth (HOSPITAL_COMMUNITY): Payer: Self-pay

## 2019-03-17 NOTE — Telephone Encounter (Signed)
I called Jeremy Powers to schedule an appointment for tomorrow. He stated he was free all day so we agreed to meet at 10:00.

## 2019-03-18 ENCOUNTER — Other Ambulatory Visit (HOSPITAL_COMMUNITY): Payer: Self-pay

## 2019-03-18 NOTE — Progress Notes (Signed)
Paramedicine Encounter    Patient ID: Jeremy Powers, male    DOB: June 03, 1955, 63 y.o.   MRN: [id number]   Patient Care Team: [facility], [facility] as PCP - General (Pediatrics)  Patient Active Problem List   Diagnosis Date Noted  . Acute systolic (congestive) heart failure ([facility]) 06/24/2018  . Aortic insufficiency 06/22/2018  . Acute on chronic congestive heart failure ([facility]) 06/22/2018  . Elevated troponin 06/22/2018  . CAD (coronary artery disease) 06/22/2018  . Left bundle branch block 06/22/2018  . History of iron deficiency anemia 06/22/2018  . Acute on chronic heart failure ([facility]) 06/22/2018    Current Outpatient Medications:  .  albuterol (VENTOLIN HFA) 108 (90 Base) MCG/ACT inhaler, Inhale 2 puffs into the lungs every 6 (six) hours as needed for wheezing or shortness of breath., Disp: 18 g, Rfl: 1 .  aspirin EC 81 MG tablet, Take 81 mg by mouth daily., Disp: , Rfl:  .  atorvastatin (LIPITOR) 40 MG tablet, TAKE 1 TABLET BY MOUTH DAILY AT 6 PM, Disp: 30 tablet, Rfl: 5 .  carvedilol (COREG) 6.25 MG tablet, Take 1 tablet (6.25 mg total) by mouth 2 (two) times daily., Disp: 60 tablet, Rfl: 3 .  digoxin (LANOXIN) 0.125 MG tablet, Take 1 tablet by mouth daily, Disp: 90 tablet, Rfl: 1 .  ferrous sulfate 325 (65 FE) MG tablet, Take 1 tablet (325 mg total) by mouth 2 (two) times daily with a meal., Disp: 180 tablet, Rfl: 3 .  furosemide (LASIX) 40 MG tablet, Take 1 tablet (40 mg total) by mouth daily as needed. Take 40 mg (1 tab) as needed for swelling., Disp: 30 tablet, Rfl: 3 .  sacubitril-valsartan (ENTRESTO) 24-26 MG, Take 1 tablet by mouth 2 (two) times daily., Disp: 60 tablet, Rfl: 6 .  spironolactone (ALDACTONE) 25 MG tablet, Take 1 tablet (25 mg total) by mouth at bedtime., Disp: 30 tablet, Rfl: 5 No Known Allergies    Social History   Socioeconomic History  . Marital status: Single    Spouse name: Not on file  . Number of children: Not on file  . Years of  education: Not on file  . Highest education level: Not on file  Occupational History  . Not on file  Social Needs  . Financial resource strain: Not on file  . Food insecurity    Worry: Not on file    Inability: Not on file  . Transportation needs    Medical: Not on file    Non-medical: Not on file  Tobacco Use  . Smoking status: Current Every Day Smoker    Packs/day: 0.50    Types: Cigarettes  . Smokeless tobacco: Never Used  Substance and Sexual Activity  . Alcohol use: Yes    Alcohol/week: 2.0 standard drinks    Types: 2 Cans of beer per week    Comment: 2 40oz beer daily  . Drug use: Yes    Types: Marijuana  . Sexual activity: Not on file  Lifestyle  . Physical activity    Days per week: Not on file    Minutes per session: Not on file  . Stress: Not on file  Relationships  . Social Musician on phone: Not on file    Gets together: Not on file    Attends religious service: Not on file    Active member of club or organization: Not on file    Attends meetings of clubs or organizations: Not on  file    Relationship status: Not on file  . Intimate partner violence    Fear of current or ex partner: Not on file    Emotionally abused: Not on file    Physically abused: Not on file    Forced sexual activity: Not on file  Other Topics Concern  . Not on file  Social History Narrative  . Not on file    Physical Exam Cardiovascular:     Rate and Rhythm: Normal rate and regular rhythm.     Pulses: Normal pulses.  Pulmonary:     Effort: Pulmonary effort is normal.     Breath sounds: Normal breath sounds.  Abdominal:     General: There is no distension.  Musculoskeletal: Normal range of motion.     Right lower leg: No edema.     Left lower leg: No edema.  Skin:    General: Skin is warm and dry.     Capillary Refill: Capillary refill takes less than 2 seconds.  Neurological:     Mental Status: He is alert and oriented to person, place, and time.   Psychiatric:        Mood and Affect: Mood normal.         Future Appointments  Date Time Provider [place]  03/23/2019  2:00 PM [facility] None  05/08/2019  2:20 PM Bensimhon, Shaune Pascal, MD [facility] None    BP 118/60 (BP Location: Left Arm, Patient Position: Sitting, Cuff Size: Normal)   Pulse 60   Resp 18   Wt 129 lb 12.8 oz (58.9 kg)   SpO2 96%   BMI 20.95 kg/m   Weight yesterday- 135 lb Last visit weight- 134.8 lb  Jeremy Powers was seen at home today and reported feeling well. He denied chest pain, SOB, headache, dizziness, orthopnea, fever or cough since out last visit. He stated he has been compliant with his medications which appeared to be true given the empty pillbox from last week. His medications were verified and his pillbox was refilled. He continues to drink alcohol and smoke cigarettes but said he is working on quitting. I will follow up next week unless it becomes necessary sooner.   Jeremy Powers, EMT 03/18/19  ACTION: Home visit completed Next visit planned for 1 week

## 2019-03-23 ENCOUNTER — Other Ambulatory Visit: Payer: Self-pay

## 2019-03-23 ENCOUNTER — Ambulatory Visit (HOSPITAL_COMMUNITY)
Admission: RE | Admit: 2019-03-23 | Discharge: 2019-03-23 | Disposition: A | Discharge status: Home / Self Care | Payer: Medicare Other | Source: Ambulatory Visit | Attending: Internal Medicine | Admitting: Internal Medicine

## 2019-03-23 VITALS — BP 140/78 | HR 63 | Wt 130.4 lb

## 2019-03-23 DIAGNOSIS — I2510 Atherosclerotic heart disease of native coronary artery without angina pectoris: Secondary | ICD-10-CM

## 2019-03-23 DIAGNOSIS — I5023 Acute on chronic systolic (congestive) heart failure: Secondary | ICD-10-CM | POA: Diagnosis present

## 2019-03-23 LAB — BASIC METABOLIC PANEL
Anion gap: 12 (ref 5–15)
BUN: 11 mg/dL (ref 8–23)
CO2: 23 mmol/L (ref 22–32)
Calcium: 8.5 mg/dL — ABNORMAL LOW (ref 8.9–10.3)
Chloride: 101 mmol/L (ref 98–111)
Creatinine, Ser: 0.86 mg/dL (ref 0.61–1.24)
GFR calc Af Amer: >60 mL/min (ref >60)
GFR calc non Af Amer: >60 mL/min (ref >60)
Glucose, Bld: 87 mg/dL (ref 70–99)
Potassium: 3.5 mmol/L (ref 3.5–5.1)
Sodium: 136 mmol/L (ref 135–145)

## 2019-03-23 MED ORDER — SACUBITRIL-VALSARTAN 49-51 MG PO TABS: 1.0000 | ORAL_TABLET | Freq: Two times a day (BID) | ORAL | 11 refills | Status: DC

## 2019-03-23 NOTE — Patient Instructions (Signed)
It was a pleasure seeing you today!  MEDICATIONS: -We are changing your medications today -Increase Entresto to 49/51 mg (1 tablet) twice daily -Call if you have questions about your medications.  LABS: -We will call you if your labs need attention.  NEXT APPOINTMENT: Return to clinic in 4 weeks with Pharmacy Clinic.  In general, to take care of your heart failure: -Limit your fluid intake to 2 Liters (half-gallon) per day.   -Limit your salt intake to ideally 2-3 grams (2000-3000 mg) per day. -Weigh yourself daily and record, and bring that "weight diary" to your next appointment.  (Weight gain of 2-3 pounds in 1 day typically means fluid weight.) -The medications for your heart are to help your heart and help you live longer.   -Please contact us before stopping any of your heart medications.  Call the clinic at [phone] with questions or to reschedule future appointments.

## 2019-03-25 ENCOUNTER — Other Ambulatory Visit (HOSPITAL_COMMUNITY): Payer: Self-pay

## 2019-03-25 NOTE — Progress Notes (Signed)
Paramedicine Encounter    Patient ID: Jeremy Powers, male    DOB: 1955-07-20, 63 y.o.   MRN: [id number]   Patient Care Team: Inc, [facility] as PCP - General (Pediatrics)  Patient Active Problem List   Diagnosis Date Noted  . Acute systolic (congestive) heart failure ([place]) 06/24/2018  . Aortic insufficiency 06/22/2018  . Acute on chronic congestive heart failure ([place]) 06/22/2018  . Elevated troponin 06/22/2018  . CAD (coronary artery disease) 06/22/2018  . Left bundle branch block 06/22/2018  . History of iron deficiency anemia 06/22/2018  . Acute on chronic heart failure ([place]) 06/22/2018    Current Outpatient Medications:  .  albuterol (VENTOLIN HFA) 108 (90 Base) MCG/ACT inhaler, Inhale 2 puffs into the lungs every 6 (six) hours as needed for wheezing or shortness of breath., Disp: 18 g, Rfl: 1 .  aspirin EC 81 MG tablet, Take 81 mg by mouth daily., Disp: , Rfl:  .  atorvastatin (LIPITOR) 40 MG tablet, TAKE 1 TABLET BY MOUTH DAILY AT 6 PM, Disp: 30 tablet, Rfl: 5 .  carvedilol (COREG) 6.25 MG tablet, Take 1 tablet (6.25 mg total) by mouth 2 (two) times daily., Disp: 60 tablet, Rfl: 3 .  digoxin (LANOXIN) 0.125 MG tablet, Take 1 tablet by mouth daily, Disp: 90 tablet, Rfl: 1 .  ferrous sulfate 325 (65 FE) MG tablet, Take 1 tablet (325 mg total) by mouth 2 (two) times daily with a meal., Disp: 180 tablet, Rfl: 3 .  furosemide (LASIX) 40 MG tablet, Take 1 tablet (40 mg total) by mouth daily as needed. Take 40 mg (1 tab) as needed for swelling., Disp: 30 tablet, Rfl: 3 .  sacubitril-valsartan (ENTRESTO) 49-51 MG, Take 1 tablet by mouth 2 (two) times daily., Disp: 60 tablet, Rfl: 11 .  spironolactone (ALDACTONE) 25 MG tablet, Take 1 tablet (25 mg total) by mouth at bedtime., Disp: 30 tablet, Rfl: 5 No Known Allergies    Social History   Socioeconomic History  . Marital status: Single    Spouse name: Not on file  . Number of children: Not on file  . Years of  education: Not on file  . Highest education level: Not on file  Occupational History  . Not on file  Social Needs  . Financial resource strain: Not on file  . Food insecurity    Worry: Not on file    Inability: Not on file  . Transportation needs    Medical: Not on file    Non-medical: Not on file  Tobacco Use  . Smoking status: Current Every Day Smoker    Packs/day: 0.50    Types: Cigarettes  . Smokeless tobacco: Never Used  Substance and Sexual Activity  . Alcohol use: Yes    Alcohol/week: 2.0 standard drinks    Types: 2 Cans of beer per week    Comment: 2 40oz beer daily  . Drug use: Yes    Types: Marijuana  . Sexual activity: Not on file  Lifestyle  . Physical activity    Days per week: Not on file    Minutes per session: Not on file  . Stress: Not on file  Relationships  . Social Herbalist on phone: Not on file    Gets together: Not on file    Attends religious service: Not on file    Active member of club or organization: Not on file    Attends meetings of clubs or organizations: Not on  file    Relationship status: Not on file  . Intimate partner violence    Fear of current or ex partner: Not on file    Emotionally abused: Not on file    Physically abused: Not on file    Forced sexual activity: Not on file  Other Topics Concern  . Not on file  Social History Narrative  . Not on file    Physical Exam Cardiovascular:     Rate and Rhythm: Normal rate and regular rhythm.     Pulses: Normal pulses.  Pulmonary:     Breath sounds: Wheezing and rhonchi present.  Abdominal:     General: There is no distension.  Musculoskeletal: Normal range of motion.     Right lower leg: No edema.     Left lower leg: No edema.  Skin:    General: Skin is warm and dry.     Capillary Refill: Capillary refill takes less than 2 seconds.  Neurological:     Mental Status: He is alert and oriented to person, place, and time.  Psychiatric:        Mood and Affect: Mood  normal.         Future Appointments  Date Time Provider [place]  04/20/2019  2:00 PM [facility] None  05/08/2019  2:20 PM Bensimhon, Shaune Pascal, MD [facility] None    BP 124/60 (BP Location: Left Arm, Patient Position: Sitting, Cuff Size: Normal)   Pulse 60   Resp 18   Wt 131 lb (59.4 kg)   SpO2 96%   BMI 21.14 kg/m   Weight yesterday- did not weigh Last visit weight- 130.4 lb  Jeremy Powers was seen at home today and reported feeling well. He denied chest pain, SOB, headache, dizziness, orthopnea, fever or cough since our last visit. He stated he has been compliant with his medications over the past week but he did not pick up spironolactone. I contacted the pharmacy and they advised he could pick that and the entresto up within the next hour. Jeremy Powers was understanding and agreeable and said he would leave to pick it up after I leave. His medications were verified and his pillbox was refilled. I will follow up next week unless it becomes necessary sooner.  [name], EMT 03/25/19  ACTION: Home visit completed Next visit planned for 1 week

## 2019-04-01 ENCOUNTER — Other Ambulatory Visit (HOSPITAL_COMMUNITY): Payer: Self-pay

## 2019-04-01 NOTE — Progress Notes (Signed)
Paramedicine Encounter    Patient ID: Jeremy Powers, male    DOB: 1955-07-20, 63 y.o.   MRN: [id number]   Patient Care Team: Jeremy Powers, Jeremy Powers as PCP - General (Pediatrics)  Patient Active Problem List   Diagnosis Date Noted  . Acute systolic (congestive) heart failure (Jeremy Powers) 06/24/2018  . Aortic insufficiency 06/22/2018  . Acute on chronic congestive heart failure (Jeremy Powers) 06/22/2018  . Elevated troponin 06/22/2018  . CAD (coronary artery disease) 06/22/2018  . Left bundle branch block 06/22/2018  . History of iron deficiency anemia 06/22/2018  . Acute on chronic heart failure (Jeremy Powers) 06/22/2018    Current Outpatient Medications:  .  albuterol (VENTOLIN HFA) 108 (90 Base) MCG/ACT inhaler, Inhale 2 puffs into the lungs every 6 (six) hours as needed for wheezing or shortness of breath., Disp: 18 g, Rfl: 1 .  aspirin EC 81 MG tablet, Take 81 mg by mouth daily., Disp: , Rfl:  .  atorvastatin (LIPITOR) 40 MG tablet, TAKE 1 TABLET BY MOUTH DAILY AT 6 PM, Disp: 30 tablet, Rfl: 5 .  carvedilol (COREG) 6.25 MG tablet, Take 1 tablet (6.25 mg total) by mouth 2 (two) times daily., Disp: 60 tablet, Rfl: 3 .  digoxin (LANOXIN) 0.125 MG tablet, Take 1 tablet by mouth daily, Disp: 90 tablet, Rfl: 1 .  ferrous sulfate 325 (65 FE) MG tablet, Take 1 tablet (325 mg total) by mouth 2 (two) times daily with a meal., Disp: 180 tablet, Rfl: 3 .  furosemide (LASIX) 40 MG tablet, Take 1 tablet (40 mg total) by mouth daily as needed. Take 40 mg (1 tab) as needed for swelling., Disp: 30 tablet, Rfl: 3 .  sacubitril-valsartan (ENTRESTO) 49-51 MG, Take 1 tablet by mouth 2 (two) times daily., Disp: 60 tablet, Rfl: 11 .  spironolactone (ALDACTONE) 25 MG tablet, Take 1 tablet (25 mg total) by mouth at bedtime., Disp: 30 tablet, Rfl: 5 No Known Allergies    Social History   Socioeconomic History  . Marital status: Single    Spouse name: Not on file  . Number of children: Not on file  . Years of  education: Not on file  . Highest education level: Not on file  Occupational History  . Not on file  Social Needs  . Financial resource strain: Not on file  . Food insecurity    Worry: Not on file    Inability: Not on file  . Transportation needs    Medical: Not on file    Non-medical: Not on file  Tobacco Use  . Smoking status: Current Every Day Smoker    Packs/day: 0.50    Types: Cigarettes  . Smokeless tobacco: Never Used  Substance and Sexual Activity  . Alcohol use: Yes    Alcohol/week: 2.0 standard drinks    Types: 2 Cans of beer per week    Comment: 2 40oz beer daily  . Drug use: Yes    Types: Marijuana  . Sexual activity: Not on file  Lifestyle  . Physical activity    Days per week: Not on file    Minutes per session: Not on file  . Stress: Not on file  Relationships  . Social Herbalist on phone: Not on file    Gets together: Not on file    Attends religious service: Not on file    Active member of club or organization: Not on file    Attends meetings of clubs or organizations: Not on  file    Relationship status: Not on file  . Intimate partner violence    Fear of current or ex partner: Not on file    Emotionally abused: Not on file    Physically abused: Not on file    Forced sexual activity: Not on file  Other Topics Concern  . Not on file  Social History Narrative  . Not on file    Physical Exam Cardiovascular:     Rate and Rhythm: Normal rate and regular rhythm.     Pulses: Normal pulses.  Pulmonary:     Effort: Pulmonary effort is normal.     Breath sounds: Normal breath sounds.  Musculoskeletal: Normal range of motion.     Right lower leg: No edema.     Left lower leg: No edema.  Skin:    General: Skin is warm and dry.     Capillary Refill: Capillary refill takes less than 2 seconds.  Neurological:     Mental Status: He is alert and oriented to person, place, and time.  Psychiatric:        Mood and Affect: Mood normal.          Future Appointments  Date Time Provider [place]  04/20/2019  2:00 PM [facility] None  05/08/2019  2:20 PM Bensimhon, Shaune Pascal, MD [facility] None    BP 122/60 (BP Location: Left Arm, Patient Position: Sitting, Cuff Size: Normal)   Pulse (!) 55   Resp 18   Wt 135 lb 12.8 oz (61.6 kg)   SpO2 99%   BMI 21.92 kg/m   Weight yesterday- 134 lb Last visit weight- 131 lb  [name] was seen at home today and reported feeling well. He denied chest pain, SOB, headache, dizziness, orthopnea, fever or cough since our last visit. He stated he has been compliant with his medications and his weigh this trending up, though it is still within an acceptable range from last week. His medications were verified and his pillbox was refilled. I will follow up next week unless it becomes necessary sooner.   [name], EMT 04/01/19  ACTION: Home visit completed Next visit planned for 1 week

## 2019-04-07 ENCOUNTER — Telehealth (HOSPITAL_COMMUNITY): Payer: Self-pay

## 2019-04-07 NOTE — Telephone Encounter (Signed)
I called Mr Jeremy Powers to schedule an appointment for tomorrow. He stated he would be available all day so we agreed to meet at 09:00.   [name], EMT 04/07/19

## 2019-04-07 NOTE — Progress Notes (Signed)
PCP: Primary Cardiologist: Dr [name]  HPI:  Jeremy Powers is a 63 y.o. male with a history of CAD with NSTEMI, s/p PCI to LAD in 2015 and 2016, s/p CABG 2017, systolic HF EF 20-25%, microcytic/iron deficiency anemia followed by hematology and GI, LBBB, and tobacco use.   In 2/20 he presented to [facility] ED with SOB, cough, and orthopnea in setting of med non-compliance. Was unable to afford copays with Medicaid. He was tachycardic and tachypneic on arrival, and was transferred to [facility] 06/22/18 for further evaluation. Echo performed which showed newly decreased EF of 25-30% from 50-55% in 12/2017.  Taken for [facility] which showed severe 1 v disease with occluded LAD within previous stent. RCA and LCX normal. LIMA to LAD and SVG to D2 patent. Cath also showed low filling pressures with moderately low cardiac output. Meds adjusted as tolerated.   Echo 02/05/19 showed EF 20-25%   Recently presented to HF Clinic for follow-up with Dr. [name]. He reported he was doing ok but not doing much. Denied CP. Complained of SOB with moderate activity and intermittent orthopnea and PND. Reported he was taking all medications. He stated he drinks one 40oz beer every day, sometimes two. Smoking 1-2 cigarettes per day.  Today he returns to HF clinic for pharmacist medication titration. At last visit with MD, losartan was increased to 50 mg daily and digoxin 0.125 mg was added. His losartan was then changed to Entresto 24/26 mg BID in pharmacy clinic on 03/05/19. Overall he is doing well with that change. No dizziness or lightheadedness since he started taking Entresto with food. No chest pains or palpitations. He breathing is ok. He gets SOB when "walking fast" and has to take frequent breaks, but he is able to complete all ADLs. He takes furosemide 40 mg daily as needed. He states he uses this approximately once per week when "my legs swell". Does not weigh himself daily. No LEE, PND or orthopnea. Is still smoking 2 cigarettes  per day and wants to quit; however, he does not wish to try any cessation aids at this time. His appetite is good. He does not follow a low salt diet. As noted at previous visits, he has struggled with compliance. Zack with paramedicine fills pill box and does note that he has been doing better with compliance but still misses some days. He stated that sometimes he does not take his medication because there is "too much on my mind". Although his mood is down, he prefers to work through his troubles alone and does not wish to speak with a therapist at this time.   . Shortness of breath/dyspnea on exertion? yes - but only when walking fast or long distances . Orthopnea/PND? no . Edema? no . Lightheadedness/dizziness? No . Daily weights at home? no . Blood pressure/heart rate monitoring at home? no . Following low-sodium/fluid-restricted diet? no  HF Medications: Carvedilol 6.25 mg twice daily Entresto 24/26 mg BID Spironolactone 25 mg daily Digoxin 0.125 mg daily Furosemide 40 mg daily as needed  Has the patient been experiencing any side effects to the medications prescribed?  no  Does the patient have any problems obtaining medications due to transportation or finances?   Yes - has Medicare and [place] Medicaid but unable to afford copays. His copays are waived at his [employer].   Understanding of regimen: poor Understanding of indications: poor Potential of compliance: poor - we discussed tools to increase adherence (I.e. cell phone alarms) Patient understands to avoid NSAIDs.  Patient understands to avoid decongestants.    Pertinent Lab Values: . Serum creatinine 0.86, BUN 11, Potassium 3.5, Sodium 136, BNP 2,078 (previously >4500 on 02/05/19),  Digoxin - no level, took medication this AM   Vital Signs: . Weight: 130.4 lbs (last clinic weight: 130.6 lbs) . Blood pressure: 140/78 . Heart rate: 61   Assessment: 1.  Chronic Systolic Heart Failure, ICM ECHO 06/2018 EF 25-30% [facility]  06/27/2018 with EF 24% - Echo 02/05/19 showed EF 20-25% - NYHA II-IIII. Euvolemic on exam.  - Vitals: BP 140/78, HR 63 -Continue furosemide 40 mg daily PRN.   -Continue carvedilol 6.25 mg twice a day. -Increase Entresto to 49/51 mg BID. Informed Zack (paramedicine) of change so he can adjust pill box.   -Continue spironolactone 25 mg daily.  -Continue digoxin 0.125 mcg daily. Plan to obtain digoxin level at next visit (had taken dose today at 10:00 AM).    -Although he would be a good candidate for Bidil, medication compliance concerns may limit use. If started, will need significant education and positive reinforcement.    2. CAD - S/P CABG 2017, LHC 06/2018  with occluded LAD normal LCX and RCA. LIMA to LAD and SVG to D2 patent (SVG to D1 occluded) - no s/s angina - Continue aspirin 81 mg daily +atorvastatin.      3. LBBB   4.  Tobacco Abuse - Still smoking 2 cigarettes per day.  - Discussed smoking cessation.   5. HTN - BP slightly elevated today -Increase  Entresto and continue carvedilol and spironolactone as above.    Plan: 1) Medication changes: Based on clinical presentation, vital signs and recent labs will Increase Entresto to 49/51 mg BID.  2) Labs: Scr 0.86, K 3.5 3) Follow-up: Pharmacy Clinic visit in 4 weeks   Audry Riles, PharmD, BCPS, CPP Heart Failure Clinic Pharmacist [phone]   Discussed patient with Dr. Lynelle Smoke.  Agree with assessment and plan.   Bonnita Nasuti Pharm.D. CPP, BCPS Clinical Pharmacist [phone] 04/07/2019 10:17 PM

## 2019-04-08 ENCOUNTER — Other Ambulatory Visit (HOSPITAL_COMMUNITY): Payer: Self-pay

## 2019-04-08 NOTE — Progress Notes (Signed)
Paramedicine Encounter    Patient ID: Jeremy Powers, male    DOB: 1955-07-20, 63 y.o.   MRN: [id number]   Patient Care Team: [facility], [facility] as PCP - General (Pediatrics)  Patient Active Problem List   Diagnosis Date Noted  . Acute systolic (congestive) heart failure ([place]) 06/24/2018  . Aortic insufficiency 06/22/2018  . Acute on chronic congestive heart failure ([place]) 06/22/2018  . Elevated troponin 06/22/2018  . CAD (coronary artery disease) 06/22/2018  . Left bundle branch block 06/22/2018  . History of iron deficiency anemia 06/22/2018  . Acute on chronic heart failure ([place]) 06/22/2018    Current Outpatient Medications:  .  albuterol (VENTOLIN HFA) 108 (90 Base) MCG/ACT inhaler, Inhale 2 puffs into the lungs every 6 (six) hours as needed for wheezing or shortness of breath., Disp: 18 g, Rfl: 1 .  aspirin EC 81 MG tablet, Take 81 mg by mouth daily., Disp: , Rfl:  .  atorvastatin (LIPITOR) 40 MG tablet, TAKE 1 TABLET BY MOUTH DAILY AT 6 PM, Disp: 30 tablet, Rfl: 5 .  carvedilol (COREG) 6.25 MG tablet, Take 1 tablet (6.25 mg total) by mouth 2 (two) times daily., Disp: 60 tablet, Rfl: 3 .  digoxin (LANOXIN) 0.125 MG tablet, Take 1 tablet by mouth daily, Disp: 90 tablet, Rfl: 1 .  ferrous sulfate 325 (65 FE) MG tablet, Take 1 tablet (325 mg total) by mouth 2 (two) times daily with a meal., Disp: 180 tablet, Rfl: 3 .  furosemide (LASIX) 40 MG tablet, Take 1 tablet (40 mg total) by mouth daily as needed. Take 40 mg (1 tab) as needed for swelling., Disp: 30 tablet, Rfl: 3 .  sacubitril-valsartan (ENTRESTO) 49-51 MG, Take 1 tablet by mouth 2 (two) times daily., Disp: 60 tablet, Rfl: 11 .  spironolactone (ALDACTONE) 25 MG tablet, Take 1 tablet (25 mg total) by mouth at bedtime., Disp: 30 tablet, Rfl: 5 No Known Allergies    Social History   Socioeconomic History  . Marital status: Single    Spouse name: Not on file  . Number of children: Not on file  . Years of  education: Not on file  . Highest education level: Not on file  Occupational History  . Not on file  Social Needs  . Financial resource strain: Not on file  . Food insecurity    Worry: Not on file    Inability: Not on file  . Transportation needs    Medical: Not on file    Non-medical: Not on file  Tobacco Use  . Smoking status: Current Every Day Smoker    Packs/day: 0.50    Types: Cigarettes  . Smokeless tobacco: Never Used  Substance and Sexual Activity  . Alcohol use: Yes    Alcohol/week: 2.0 standard drinks    Types: 2 Cans of beer per week    Comment: 2 40oz beer daily  . Drug use: Yes    Types: Marijuana  . Sexual activity: Not on file  Lifestyle  . Physical activity    Days per week: Not on file    Minutes per session: Not on file  . Stress: Not on file  Relationships  . Social Herbalist on phone: Not on file    Gets together: Not on file    Attends religious service: Not on file    Active member of club or organization: Not on file    Attends meetings of clubs or organizations: Not on  file    Relationship status: Not on file  . Intimate partner violence    Fear of current or ex partner: Not on file    Emotionally abused: Not on file    Physically abused: Not on file    Forced sexual activity: Not on file  Other Topics Concern  . Not on file  Social History Narrative  . Not on file    Physical Exam Cardiovascular:     Rate and Rhythm: Normal rate and regular rhythm.     Pulses: Normal pulses.  Pulmonary:     Effort: Pulmonary effort is normal.     Breath sounds: Normal breath sounds.  Musculoskeletal: Normal range of motion.     Right lower leg: No edema.     Left lower leg: No edema.  Skin:    General: Skin is warm and dry.     Capillary Refill: Capillary refill takes less than 2 seconds.  Neurological:     Mental Status: He is alert and oriented to person, place, and time.  Psychiatric:        Mood and Affect: Mood normal.          Future Appointments  Date Time Provider Department Center  04/20/2019  2:00 PM [facility] None  05/08/2019  2:20 PM Bensimhon, Bevelyn Buckles, MD [facility] None    BP 136/72 (BP Location: Left Arm, Patient Position: Sitting, Cuff Size: Normal)   Pulse 68   Resp 16   Wt 132 lb 12.8 oz (60.2 kg)   SpO2 95%   BMI 21.43 kg/m   Weight yesterday- did not weigh Last visit weight- 135 lb  Mr Rafalski was seen at home today and reported feeling generally well. He denied chest pain, SOB, headache, dizziness, orthopnea, fever or cough since our last visit. He stated he has been compliant with his medications but has not picked up the prescription I called in last week. He stated he would be able to go get the medicine today and asked that I come back on Friday to fill an extra box for him, as he is going out of town next week. His medications were verified and his pillbox was refilled I will follow up Friday.   Jacqualine Code, EMT 04/08/19  ACTION: Home visit completed Next visit planned for Friday

## 2019-04-15 ENCOUNTER — Telehealth (HOSPITAL_COMMUNITY): Payer: Self-pay

## 2019-04-15 NOTE — Telephone Encounter (Signed)
I called Mr Jeremy Powers to see if he was available. I had forgotten that [place] be at the beach this [place] so we agreed to meet next Wednesday. I will call next week to confirm a time.   [name], EMT 04/15/19

## 2019-04-20 ENCOUNTER — Inpatient Hospital Stay (HOSPITAL_COMMUNITY)
Admission: RE | Admit: 2019-04-20 | Discharge: 2019-04-20 | Disposition: A | Discharge status: Home / Self Care | Payer: Medicare Other | Source: Ambulatory Visit

## 2019-04-22 ENCOUNTER — Other Ambulatory Visit (HOSPITAL_COMMUNITY): Payer: Self-pay

## 2019-04-22 NOTE — Progress Notes (Signed)
Paramedicine Encounter    Patient ID: Jeremy Powers, male    DOB: 1955-07-20, 63 y.o.   MRN: [id number]   Patient Care Team: [facility] as PCP - General (Pediatrics)  Patient Active Problem List   Diagnosis Date Noted  . Acute systolic (congestive) heart failure ([place]) 06/24/2018  . Aortic insufficiency 06/22/2018  . Acute on chronic congestive heart failure ([place]) 06/22/2018  . Elevated troponin 06/22/2018  . CAD (coronary artery disease) 06/22/2018  . Left bundle branch block 06/22/2018  . History of iron deficiency anemia 06/22/2018  . Acute on chronic heart failure ([place]) 06/22/2018    Current Outpatient Medications:  .  albuterol (VENTOLIN HFA) 108 (90 Base) MCG/ACT inhaler, Inhale 2 puffs into the lungs every 6 (six) hours as needed for wheezing or shortness of breath., Disp: 18 g, Rfl: 1 .  aspirin EC 81 MG tablet, Take 81 mg by mouth daily., Disp: , Rfl:  .  atorvastatin (LIPITOR) 40 MG tablet, TAKE 1 TABLET BY MOUTH DAILY AT 6 PM, Disp: 30 tablet, Rfl: 5 .  carvedilol (COREG) 6.25 MG tablet, Take 1 tablet (6.25 mg total) by mouth 2 (two) times daily., Disp: 60 tablet, Rfl: 3 .  digoxin (LANOXIN) 0.125 MG tablet, Take 1 tablet by mouth daily, Disp: 90 tablet, Rfl: 1 .  ferrous sulfate 325 (65 FE) MG tablet, Take 1 tablet (325 mg total) by mouth 2 (two) times daily with a meal., Disp: 180 tablet, Rfl: 3 .  furosemide (LASIX) 40 MG tablet, Take 1 tablet (40 mg total) by mouth daily as needed. Take 40 mg (1 tab) as needed for swelling., Disp: 30 tablet, Rfl: 3 .  sacubitril-valsartan (ENTRESTO) 49-51 MG, Take 1 tablet by mouth 2 (two) times daily., Disp: 60 tablet, Rfl: 11 .  spironolactone (ALDACTONE) 25 MG tablet, Take 1 tablet (25 mg total) by mouth at bedtime., Disp: 30 tablet, Rfl: 5 No Known Allergies    Social History   Socioeconomic History  . Marital status: Single    Spouse name: Not on file  . Number of children: Not on file  . Years of  education: Not on file  . Highest education level: Not on file  Occupational History  . Not on file  Social Needs  . Financial resource strain: Not on file  . Food insecurity    Worry: Not on file    Inability: Not on file  . Transportation needs    Medical: Not on file    Non-medical: Not on file  Tobacco Use  . Smoking status: Current Every Day Smoker    Packs/day: 0.50    Types: Cigarettes  . Smokeless tobacco: Never Used  Substance and Sexual Activity  . Alcohol use: Yes    Alcohol/week: 2.0 standard drinks    Types: 2 Cans of beer per week    Comment: 2 40oz beer daily  . Drug use: Yes    Types: Marijuana  . Sexual activity: Not on file  Lifestyle  . Physical activity    Days per week: Not on file    Minutes per session: Not on file  . Stress: Not on file  Relationships  . Social Herbalist on phone: Not on file    Gets together: Not on file    Attends religious service: Not on file    Active member of club or organization: Not on file    Attends meetings of clubs or organizations: Not on  file    Relationship status: Not on file  . Intimate partner violence    Fear of current or ex partner: Not on file    Emotionally abused: Not on file    Physically abused: Not on file    Forced sexual activity: Not on file  Other Topics Concern  . Not on file  Social History Narrative  . Not on file    Physical Exam Cardiovascular:     Rate and Rhythm: Normal rate and regular rhythm.     Pulses: Normal pulses.  Pulmonary:     Effort: Pulmonary effort is normal.     Breath sounds: Normal breath sounds.  Musculoskeletal: Normal range of motion.     Right lower leg: Edema present.     Left lower leg: Edema present.  Skin:    General: Skin is warm and dry.     Capillary Refill: Capillary refill takes less than 2 seconds.  Neurological:     Mental Status: He is alert and oriented to person, place, and time.  Psychiatric:        Mood and Affect: Mood normal.          Future Appointments  Date Time Provider [place]  05/08/2019  2:20 PM Bensimhon, Shaune Pascal, MD [facility] None    BP 120/70 (BP Location: Left Arm, Patient Position: Sitting, Cuff Size: Normal)   Pulse 74   Resp 18   Wt 137 lb (62.1 kg)   SpO2 97%   BMI 22.11 kg/m   Weight yesterday- did not weigh Last visit weight- 132 lb  [name] was seen at home today and reported feeling generally well. He denied chest pain, SOB, headache, dizziness, orthopnea, fever or cough since our last appointment. He stated he has been compliant with his medications over the past two weeks but has not been weighing while he was out of town on vacation. His weight was elevated slightly and he exhibited BLEE. I advised that he should get back on track with his diuretics and following his fluid restrictions. He was understanding and agreeable. He has stopped smoking cigarettes for the past two weeks and is very optimistic about being able to stay away from them. His medications were verified and his pillbox was refilled. I will follow up next week.   Jeremy Powers, EMT 04/22/19  ACTION: Home visit completed Next visit planned for 1 week

## 2019-04-29 ENCOUNTER — Other Ambulatory Visit (HOSPITAL_COMMUNITY): Payer: Self-pay

## 2019-04-29 NOTE — Progress Notes (Signed)
Paramedicine Encounter    Patient ID: Jeremy Powers, male    DOB: 1955-07-20, 63 y.o.   MRN: [id number]   Patient Care Team: [facility] as PCP - General (Pediatrics)  Patient Active Problem List   Diagnosis Date Noted  . Acute systolic (congestive) heart failure ([place]) 06/24/2018  . Aortic insufficiency 06/22/2018  . Acute on chronic congestive heart failure ([place]) 06/22/2018  . Elevated troponin 06/22/2018  . CAD (coronary artery disease) 06/22/2018  . Left bundle branch block 06/22/2018  . History of iron deficiency anemia 06/22/2018  . Acute on chronic heart failure ([place]) 06/22/2018    Current Outpatient Medications:  .  albuterol (VENTOLIN HFA) 108 (90 Base) MCG/ACT inhaler, Inhale 2 puffs into the lungs every 6 (six) hours as needed for wheezing or shortness of breath., Disp: 18 g, Rfl: 1 .  aspirin EC 81 MG tablet, Take 81 mg by mouth daily., Disp: , Rfl:  .  atorvastatin (LIPITOR) 40 MG tablet, TAKE 1 TABLET BY MOUTH DAILY AT 6 PM, Disp: 30 tablet, Rfl: 5 .  carvedilol (COREG) 6.25 MG tablet, Take 1 tablet (6.25 mg total) by mouth 2 (two) times daily., Disp: 60 tablet, Rfl: 3 .  digoxin (LANOXIN) 0.125 MG tablet, Take 1 tablet by mouth daily, Disp: 90 tablet, Rfl: 1 .  ferrous sulfate 325 (65 FE) MG tablet, Take 1 tablet (325 mg total) by mouth 2 (two) times daily with a meal., Disp: 180 tablet, Rfl: 3 .  furosemide (LASIX) 40 MG tablet, Take 1 tablet (40 mg total) by mouth daily as needed. Take 40 mg (1 tab) as needed for swelling., Disp: 30 tablet, Rfl: 3 .  sacubitril-valsartan (ENTRESTO) 49-51 MG, Take 1 tablet by mouth 2 (two) times daily., Disp: 60 tablet, Rfl: 11 .  spironolactone (ALDACTONE) 25 MG tablet, Take 1 tablet (25 mg total) by mouth at bedtime., Disp: 30 tablet, Rfl: 5 No Known Allergies    Social History   Socioeconomic History  . Marital status: Single    Spouse name: Not on file  . Number of children: Not on file  . Years of  education: Not on file  . Highest education level: Not on file  Occupational History  . Not on file  Social Needs  . Financial resource strain: Not on file  . Food insecurity    Worry: Not on file    Inability: Not on file  . Transportation needs    Medical: Not on file    Non-medical: Not on file  Tobacco Use  . Smoking status: Current Every Day Smoker    Packs/day: 0.50    Types: Cigarettes  . Smokeless tobacco: Never Used  Substance and Sexual Activity  . Alcohol use: Yes    Alcohol/week: 2.0 standard drinks    Types: 2 Cans of beer per week    Comment: 2 40oz beer daily  . Drug use: Yes    Types: Marijuana  . Sexual activity: Not on file  Lifestyle  . Physical activity    Days per week: Not on file    Minutes per session: Not on file  . Stress: Not on file  Relationships  . Social Herbalist on phone: Not on file    Gets together: Not on file    Attends religious service: Not on file    Active member of club or organization: Not on file    Attends meetings of clubs or organizations: Not on  file    Relationship status: Not on file  . Intimate partner violence    Fear of current or ex partner: Not on file    Emotionally abused: Not on file    Physically abused: Not on file    Forced sexual activity: Not on file  Other Topics Concern  . Not on file  Social History Narrative  . Not on file    Physical Exam Cardiovascular:     Rate and Rhythm: Normal rate and regular rhythm.     Pulses: Normal pulses.  Pulmonary:     Effort: Pulmonary effort is normal.     Breath sounds: Normal breath sounds.  Musculoskeletal: Normal range of motion.     Right lower leg: Edema present.     Left lower leg: Edema present.  Skin:    General: Skin is warm and dry.     Capillary Refill: Capillary refill takes less than 2 seconds.  Neurological:     Mental Status: He is alert and oriented to person, place, and time.  Psychiatric:        Mood and Affect: Mood normal.          Future Appointments  Date Time Provider [place]  05/08/2019  2:20 PM Bensimhon, Shaune Pascal, MD [facility] None    BP 132/78 (BP Location: Left Arm, Patient Position: Sitting, Cuff Size: Normal)   Pulse 80   Resp 16   Wt 136 lb 6.4 oz (61.9 kg)   SpO2 96%   BMI 22.02 kg/m   Weight yesterday- did not weigh Last visit weight- 137 lb  Ms vitug was seen at home today and reported feeling well. He denied chest pain, SOB, headache, dizziness, orthopnea, fever or cough over the past week. He stated he has been compliant with his medications over the past week and his weight has been stable. His medications were verified and his pillbox was refilled. I will follow up next week.   [name], EMT 04/29/19  ACTION: Home visit completed Next visit planned for 1 week

## 2019-05-06 ENCOUNTER — Other Ambulatory Visit (HOSPITAL_COMMUNITY): Payer: Self-pay

## 2019-05-06 NOTE — Progress Notes (Signed)
Paramedicine Encounter    Patient ID: Jeremy Powers, male    DOB: 14-Oct-1955, 63 y.o.   MRN: [id number]   Patient Care Team: [facility] as PCP - General (Pediatrics)  Patient Active Problem List   Diagnosis Date Noted  . Acute systolic (congestive) heart failure ([facility]) 06/24/2018  . Aortic insufficiency 06/22/2018  . Acute on chronic congestive heart failure ([facility]) 06/22/2018  . Elevated troponin 06/22/2018  . CAD (coronary artery disease) 06/22/2018  . Left bundle branch block 06/22/2018  . History of iron deficiency anemia 06/22/2018  . Acute on chronic heart failure ([facility]) 06/22/2018    Current Outpatient Medications:  .  albuterol (VENTOLIN HFA) 108 (90 Base) MCG/ACT inhaler, Inhale 2 puffs into the lungs every 6 (six) hours as needed for wheezing or shortness of breath., Disp: 18 g, Rfl: 1 .  aspirin EC 81 MG tablet, Take 81 mg by mouth daily., Disp: , Rfl:  .  atorvastatin (LIPITOR) 40 MG tablet, TAKE 1 TABLET BY MOUTH DAILY AT 6 PM, Disp: 30 tablet, Rfl: 5 .  carvedilol (COREG) 6.25 MG tablet, Take 1 tablet (6.25 mg total) by mouth 2 (two) times daily., Disp: 60 tablet, Rfl: 3 .  digoxin (LANOXIN) 0.125 MG tablet, Take 1 tablet by mouth daily, Disp: 90 tablet, Rfl: 1 .  ferrous sulfate 325 (65 FE) MG tablet, Take 1 tablet (325 mg total) by mouth 2 (two) times daily with a meal., Disp: 180 tablet, Rfl: 3 .  furosemide (LASIX) 40 MG tablet, Take 1 tablet (40 mg total) by mouth daily as needed. Take 40 mg (1 tab) as needed for swelling., Disp: 30 tablet, Rfl: 3 .  sacubitril-valsartan (ENTRESTO) 49-51 MG, Take 1 tablet by mouth 2 (two) times daily., Disp: 60 tablet, Rfl: 11 .  spironolactone (ALDACTONE) 25 MG tablet, Take 1 tablet (25 mg total) by mouth at bedtime., Disp: 30 tablet, Rfl: 5 No Known Allergies    Social History   Socioeconomic History  . Marital status: Single    Spouse name: Not on file  . Number of children: Not on file  . Years of  education: Not on file  . Highest education level: Not on file  Occupational History  . Not on file  Tobacco Use  . Smoking status: Current Every Day Smoker    Packs/day: 0.50    Types: Cigarettes  . Smokeless tobacco: Never Used  Substance and Sexual Activity  . Alcohol use: Yes    Alcohol/week: 2.0 standard drinks    Types: 2 Cans of beer per week    Comment: 2 40oz beer daily  . Drug use: Yes    Types: Marijuana  . Sexual activity: Not on file  Other Topics Concern  . Not on file  Social History Narrative  . Not on file   Social Determinants of Health   Financial Resource Strain:   . Difficulty of Paying Living Expenses: Not on file  Food Insecurity:   . Worried About Programme researcher, broadcasting/film/video in the Last Year: Not on file  . Ran Out of Food in the Last Year: Not on file  Transportation Needs:   . Lack of Transportation (Medical): Not on file  . Lack of Transportation (Non-Medical): Not on file  Physical Activity:   . Days of Exercise per Week: Not on file  . Minutes of Exercise per Session: Not on file  Stress:   . Feeling of Stress : Not on file  Social  Connections:   . Frequency of Communication with Friends and Family: Not on file  . Frequency of Social Gatherings with Friends and Family: Not on file  . Attends Religious Services: Not on file  . Active Member of Clubs or Organizations: Not on file  . Attends Archivist Meetings: Not on file  . Marital Status: Not on file  Intimate Partner Violence:   . Fear of Current or Ex-Partner: Not on file  . Emotionally Abused: Not on file  . Physically Abused: Not on file  . Sexually Abused: Not on file    Physical Exam Cardiovascular:     Rate and Rhythm: Normal rate and regular rhythm.     Pulses: Normal pulses.  Pulmonary:     Effort: Pulmonary effort is normal.     Breath sounds: Normal breath sounds.  Abdominal:     General: Abdomen is flat.  Musculoskeletal:        General: Normal range of motion.      Right lower leg: No edema.     Left lower leg: No edema.  Skin:    General: Skin is warm and dry.     Capillary Refill: Capillary refill takes less than 2 seconds.  Neurological:     Mental Status: He is alert and oriented to person, place, and time.  Psychiatric:        Mood and Affect: Mood normal.         Future Appointments  Date Time Provider [place]  05/08/2019  2:20 PM Bensimhon, Shaune Pascal, MD [facility] None    BP 118/68 (BP Location: Left Arm, Patient Position: Sitting, Cuff Size: Normal)   Pulse 70   Resp 16   Wt 135 lb 9.6 oz (61.5 kg)   SpO2 97%   BMI 21.89 kg/m   Weight yesterday- 135 lb Last visit weight- 136.4 lb  [name] was seen at home today and reported feeling generally well. He denied chest pain, SOB, headache, dizziness, orthopnea, fever or cough since our last visit. He stated he has been compliant with his medications over the past week and his weight has been stable. He continues to abstain from cigarettes and is decreasing his alcohol intake. His medications were verified and his pillbox was refilled. I will follow up next week.   [name], EMT 05/06/19  ACTION: Home visit completed Next visit planned for 1 week

## 2019-05-07 ENCOUNTER — Encounter (HOSPITAL_COMMUNITY): Payer: Medicare Other | Admitting: Cardiology

## 2019-05-08 ENCOUNTER — Encounter (HOSPITAL_COMMUNITY): Payer: Medicare Other | Admitting: Internal Medicine

## 2019-05-13 ENCOUNTER — Other Ambulatory Visit (HOSPITAL_COMMUNITY): Payer: Self-pay

## 2019-05-13 NOTE — Progress Notes (Signed)
Paramedicine Encounter    Patient ID: Jeremy Powers, male    DOB: 14-Oct-1955, 63 y.o.   MRN: [id number]   Patient Care Team: [facility] as PCP - General (Pediatrics)  Patient Active Problem List   Diagnosis Date Noted  . Acute systolic (congestive) heart failure ([facility]) 06/24/2018  . Aortic insufficiency 06/22/2018  . Acute on chronic congestive heart failure ([facility]) 06/22/2018  . Elevated troponin 06/22/2018  . CAD (coronary artery disease) 06/22/2018  . Left bundle branch block 06/22/2018  . History of iron deficiency anemia 06/22/2018  . Acute on chronic heart failure ([facility]) 06/22/2018    Current Outpatient Medications:  .  albuterol (VENTOLIN HFA) 108 (90 Base) MCG/ACT inhaler, Inhale 2 puffs into the lungs every 6 (six) hours as needed for wheezing or shortness of breath., Disp: 18 g, Rfl: 1 .  aspirin EC 81 MG tablet, Take 81 mg by mouth daily., Disp: , Rfl:  .  atorvastatin (LIPITOR) 40 MG tablet, TAKE 1 TABLET BY MOUTH DAILY AT 6 PM, Disp: 30 tablet, Rfl: 5 .  carvedilol (COREG) 6.25 MG tablet, Take 1 tablet (6.25 mg total) by mouth 2 (two) times daily., Disp: 60 tablet, Rfl: 3 .  digoxin (LANOXIN) 0.125 MG tablet, Take 1 tablet by mouth daily, Disp: 90 tablet, Rfl: 1 .  ferrous sulfate 325 (65 FE) MG tablet, Take 1 tablet (325 mg total) by mouth 2 (two) times daily with a meal., Disp: 180 tablet, Rfl: 3 .  furosemide (LASIX) 40 MG tablet, Take 1 tablet (40 mg total) by mouth daily as needed. Take 40 mg (1 tab) as needed for swelling., Disp: 30 tablet, Rfl: 3 .  sacubitril-valsartan (ENTRESTO) 49-51 MG, Take 1 tablet by mouth 2 (two) times daily., Disp: 60 tablet, Rfl: 11 .  spironolactone (ALDACTONE) 25 MG tablet, Take 1 tablet (25 mg total) by mouth at bedtime., Disp: 30 tablet, Rfl: 5 No Known Allergies    Social History   Socioeconomic History  . Marital status: Single    Spouse name: Not on file  . Number of children: Not on file  . Years of  education: Not on file  . Highest education level: Not on file  Occupational History  . Not on file  Tobacco Use  . Smoking status: Current Every Day Smoker    Packs/day: 0.50    Types: Cigarettes  . Smokeless tobacco: Never Used  Substance and Sexual Activity  . Alcohol use: Yes    Alcohol/week: 2.0 standard drinks    Types: 2 Cans of beer per week    Comment: 2 40oz beer daily  . Drug use: Yes    Types: Marijuana  . Sexual activity: Not on file  Other Topics Concern  . Not on file  Social History Narrative  . Not on file   Social Determinants of Health   Financial Resource Strain:   . Difficulty of Paying Living Expenses: Not on file  Food Insecurity:   . Worried About Programme researcher, broadcasting/film/video in the Last Year: Not on file  . Ran Out of Food in the Last Year: Not on file  Transportation Needs:   . Lack of Transportation (Medical): Not on file  . Lack of Transportation (Non-Medical): Not on file  Physical Activity:   . Days of Exercise per Week: Not on file  . Minutes of Exercise per Session: Not on file  Stress:   . Feeling of Stress : Not on file  Social  Connections:   . Frequency of Communication with Friends and Family: Not on file  . Frequency of Social Gatherings with Friends and Family: Not on file  . Attends Religious Services: Not on file  . Active Member of Clubs or Organizations: Not on file  . Attends Archivist Meetings: Not on file  . Marital Status: Not on file  Intimate Partner Violence:   . Fear of Current or Ex-Partner: Not on file  . Emotionally Abused: Not on file  . Physically Abused: Not on file  . Sexually Abused: Not on file    Physical Exam Cardiovascular:     Rate and Rhythm: Normal rate and regular rhythm.     Pulses: Normal pulses.  Pulmonary:     Effort: Pulmonary effort is normal.     Breath sounds: Normal breath sounds.  Musculoskeletal:        General: Normal range of motion.     Right lower leg: No edema.     Left  lower leg: No edema.  Skin:    General: Skin is warm and dry.  Neurological:     Mental Status: He is alert and oriented to person, place, and time.  Psychiatric:        Mood and Affect: Mood normal.         No future appointments.  BP 105/60 (BP Location: Left Arm, Patient Position: Sitting, Cuff Size: Normal)   Pulse 70   Resp 16   Wt 134 lb (60.8 kg)   SpO2 98%   BMI 21.63 kg/m   Weight yesterday- 133.6 lb Last visit weight-n135 lb  Mr Audino was seen at home today and rpeorted feeling well. He denied chest pain, SOB, headache, dizziness, orthopnea, fever or cough since our last visit. He stated he has been compliant with his medications over the past week and his weight has been stable. His medications were verified and his pillbox was refilled. I will follow up next week.   [name], EMT 05/13/19  ACTION: Home visit completed Next visit planned for 1 week

## 2019-05-20 ENCOUNTER — Other Ambulatory Visit (HOSPITAL_COMMUNITY): Payer: Self-pay

## 2019-05-20 NOTE — Progress Notes (Signed)
Paramedicine Encounter    Patient ID: Jeremy Powers, male    DOB: 14-Oct-1955, 63 y.o.   MRN: [id number]   Patient Care Team: [facility] as PCP - General (Pediatrics)  Patient Active Problem List   Diagnosis Date Noted  . Acute systolic (congestive) heart failure (HCC) 06/24/2018  . Aortic insufficiency 06/22/2018  . Acute on chronic congestive heart failure (HCC) 06/22/2018  . Elevated troponin 06/22/2018  . CAD (coronary artery disease) 06/22/2018  . Left bundle branch block 06/22/2018  . History of iron deficiency anemia 06/22/2018  . Acute on chronic heart failure (HCC) 06/22/2018    Current Outpatient Medications:  .  albuterol (VENTOLIN HFA) 108 (90 Base) MCG/ACT inhaler, Inhale 2 puffs into the lungs every 6 (six) hours as needed for wheezing or shortness of breath., Disp: 18 g, Rfl: 1 .  aspirin EC 81 MG tablet, Take 81 mg by mouth daily., Disp: , Rfl:  .  atorvastatin (LIPITOR) 40 MG tablet, TAKE 1 TABLET BY MOUTH DAILY AT 6 PM, Disp: 30 tablet, Rfl: 5 .  carvedilol (COREG) 6.25 MG tablet, Take 1 tablet (6.25 mg total) by mouth 2 (two) times daily., Disp: 60 tablet, Rfl: 3 .  digoxin (LANOXIN) 0.125 MG tablet, Take 1 tablet by mouth daily, Disp: 90 tablet, Rfl: 1 .  ferrous sulfate 325 (65 FE) MG tablet, Take 1 tablet (325 mg total) by mouth 2 (two) times daily with a meal., Disp: 180 tablet, Rfl: 3 .  furosemide (LASIX) 40 MG tablet, Take 1 tablet (40 mg total) by mouth daily as needed. Take 40 mg (1 tab) as needed for swelling., Disp: 30 tablet, Rfl: 3 .  sacubitril-valsartan (ENTRESTO) 49-51 MG, Take 1 tablet by mouth 2 (two) times daily., Disp: 60 tablet, Rfl: 11 .  spironolactone (ALDACTONE) 25 MG tablet, Take 1 tablet (25 mg total) by mouth at bedtime., Disp: 30 tablet, Rfl: 5 No Known Allergies    Social History   Socioeconomic History  . Marital status: Single    Spouse name: Not on file  . Number of children: Not on file  . Years of  education: Not on file  . Highest education level: Not on file  Occupational History  . Not on file  Tobacco Use  . Smoking status: Current Every Day Smoker    Packs/day: 0.50    Types: Cigarettes  . Smokeless tobacco: Never Used  Substance and Sexual Activity  . Alcohol use: Yes    Alcohol/week: 2.0 standard drinks    Types: 2 Cans of beer per week    Comment: 2 40oz beer daily  . Drug use: Yes    Types: Marijuana  . Sexual activity: Not on file  Other Topics Concern  . Not on file  Social History Narrative  . Not on file   Social Determinants of Health   Financial Resource Strain:   . Difficulty of Paying Living Expenses: Not on file  Food Insecurity:   . Worried About Programme researcher, broadcasting/film/video in the Last Year: Not on file  . Ran Out of Food in the Last Year: Not on file  Transportation Needs:   . Lack of Transportation (Medical): Not on file  . Lack of Transportation (Non-Medical): Not on file  Physical Activity:   . Days of Exercise per Week: Not on file  . Minutes of Exercise per Session: Not on file  Stress:   . Feeling of Stress : Not on file  Social  Connections:   . Frequency of Communication with Friends and Family: Not on file  . Frequency of Social Gatherings with Friends and Family: Not on file  . Attends Religious Services: Not on file  . Active Member of Clubs or Organizations: Not on file  . Attends Archivist Meetings: Not on file  . Marital Status: Not on file  Intimate Partner Violence:   . Fear of Current or Ex-Partner: Not on file  . Emotionally Abused: Not on file  . Physically Abused: Not on file  . Sexually Abused: Not on file    Physical Exam Cardiovascular:     Rate and Rhythm: Normal rate and regular rhythm.     Pulses: Normal pulses.  Pulmonary:     Effort: Pulmonary effort is normal.  Musculoskeletal:        General: Normal range of motion.     Right lower leg: No edema.     Left lower leg: No edema.  Skin:    General:  Skin is warm and dry.     Capillary Refill: Capillary refill takes less than 2 seconds.  Neurological:     Mental Status: He is alert and oriented to person, place, and time.  Psychiatric:        Mood and Affect: Mood normal.         No future appointments.  BP 119/70 (BP Location: Left Arm, Patient Position: Sitting, Cuff Size: Normal)   Pulse 64   Resp 16   Wt 132 lb (59.9 kg)   SpO2 97%   BMI 21.31 kg/m   Weight yesterday- did not weigh  Last visit weight- 134 lb.  Mr Morozov was seen at home today and reported feeling well. He denied chest pain, SOB, headache, dizziness, orthopnea, fever or cough since our last visit. He stated he has been compliant with his medications but he has not been weighing because his scale was inadvertently switched from pounds to kilograms. I fixed this for him and he stated he would start weight daily again. His medications were verified and his pillbox was refilled. I will follow up next week.   [name], EMT 05/20/19  ACTION: Home visit completed Next visit planned for 1 week

## 2019-05-27 ENCOUNTER — Other Ambulatory Visit (HOSPITAL_COMMUNITY): Payer: Self-pay

## 2019-05-27 NOTE — Progress Notes (Signed)
Paramedicine Encounter    Patient ID: Jeremy Powers, male    DOB: 14-Oct-1955, 64 y.o.   MRN: [id number]   Patient Care Team: [facility] as PCP - General (Pediatrics)  Patient Active Problem List   Diagnosis Date Noted  . Acute systolic (congestive) heart failure (HCC) 06/24/2018  . Aortic insufficiency 06/22/2018  . Acute on chronic congestive heart failure (HCC) 06/22/2018  . Elevated troponin 06/22/2018  . CAD (coronary artery disease) 06/22/2018  . Left bundle branch block 06/22/2018  . History of iron deficiency anemia 06/22/2018  . Acute on chronic heart failure (HCC) 06/22/2018    Current Outpatient Medications:  .  albuterol (VENTOLIN HFA) 108 (90 Base) MCG/ACT inhaler, Inhale 2 puffs into the lungs every 6 (six) hours as needed for wheezing or shortness of breath., Disp: 18 g, Rfl: 1 .  aspirin EC 81 MG tablet, Take 81 mg by mouth daily., Disp: , Rfl:  .  atorvastatin (LIPITOR) 40 MG tablet, TAKE 1 TABLET BY MOUTH DAILY AT 6 PM, Disp: 30 tablet, Rfl: 5 .  carvedilol (COREG) 6.25 MG tablet, Take 1 tablet (6.25 mg total) by mouth 2 (two) times daily., Disp: 60 tablet, Rfl: 3 .  digoxin (LANOXIN) 0.125 MG tablet, Take 1 tablet by mouth daily, Disp: 90 tablet, Rfl: 1 .  ferrous sulfate 325 (65 FE) MG tablet, Take 1 tablet (325 mg total) by mouth 2 (two) times daily with a meal., Disp: 180 tablet, Rfl: 3 .  furosemide (LASIX) 40 MG tablet, Take 1 tablet (40 mg total) by mouth daily as needed. Take 40 mg (1 tab) as needed for swelling., Disp: 30 tablet, Rfl: 3 .  sacubitril-valsartan (ENTRESTO) 49-51 MG, Take 1 tablet by mouth 2 (two) times daily., Disp: 60 tablet, Rfl: 11 .  spironolactone (ALDACTONE) 25 MG tablet, Take 1 tablet (25 mg total) by mouth at bedtime., Disp: 30 tablet, Rfl: 5 No Known Allergies    Social History   Socioeconomic History  . Marital status: Single    Spouse name: Not on file  . Number of children: Not on file  . Years of  education: Not on file  . Highest education level: Not on file  Occupational History  . Not on file  Tobacco Use  . Smoking status: Current Every Day Smoker    Packs/day: 0.50    Types: Cigarettes  . Smokeless tobacco: Never Used  Substance and Sexual Activity  . Alcohol use: Yes    Alcohol/week: 2.0 standard drinks    Types: 2 Cans of beer per week    Comment: 2 40oz beer daily  . Drug use: Yes    Types: Marijuana  . Sexual activity: Not on file  Other Topics Concern  . Not on file  Social History Narrative  . Not on file   Social Determinants of Health   Financial Resource Strain:   . Difficulty of Paying Living Expenses: Not on file  Food Insecurity:   . Worried About Programme researcher, broadcasting/film/video in the Last Year: Not on file  . Ran Out of Food in the Last Year: Not on file  Transportation Needs:   . Lack of Transportation (Medical): Not on file  . Lack of Transportation (Non-Medical): Not on file  Physical Activity:   . Days of Exercise per Week: Not on file  . Minutes of Exercise per Session: Not on file  Stress:   . Feeling of Stress : Not on file  Social  Connections:   . Frequency of Communication with Friends and Family: Not on file  . Frequency of Social Gatherings with Friends and Family: Not on file  . Attends Religious Services: Not on file  . Active Member of Clubs or Organizations: Not on file  . Attends Banker Meetings: Not on file  . Marital Status: Not on file  Intimate Partner Violence:   . Fear of Current or Ex-Partner: Not on file  . Emotionally Abused: Not on file  . Physically Abused: Not on file  . Sexually Abused: Not on file    Physical Exam Cardiovascular:     Rate and Rhythm: Normal rate and regular rhythm.     Pulses: Normal pulses.  Pulmonary:     Effort: Pulmonary effort is normal.     Breath sounds: Normal breath sounds.  Abdominal:     General: Abdomen is flat. There is no distension.  Musculoskeletal:        General:  Normal range of motion.     Right lower leg: No edema.     Left lower leg: No edema.  Skin:    General: Skin is warm and dry.     Capillary Refill: Capillary refill takes less than 2 seconds.  Neurological:     Mental Status: He is alert and oriented to person, place, and time.  Psychiatric:        Mood and Affect: Mood normal.         No future appointments.  BP 127/64 (BP Location: Left Arm, Patient Position: Sitting, Cuff Size: Normal)   Pulse 64   Resp 16   SpO2 98%   Weight yesterday- 135 lb Last visit weight- 132 lb  [name] was seen at home today and reported feeling well. He denied chest pain, SOB, headache, dizziness, orthopnea, fever or cough since our last visit. He stated he has been compliant with her medications over the past week and his weight has been stable. His medications were verified and his pillbox was refilled. I will follow up next week.   Jacqualine Code, EMT 05/27/19  ACTION: Home visit completed Next visit planned for 1 week

## 2019-06-03 ENCOUNTER — Other Ambulatory Visit (HOSPITAL_COMMUNITY): Payer: Self-pay

## 2019-06-03 NOTE — Progress Notes (Signed)
Paramedicine Encounter    Patient ID: Jeremy Powers, male    DOB: 14-Oct-1955, 64 y.o.   MRN: [id number]   Patient Care Team: [facility] as PCP - General (Pediatrics)  Patient Active Problem List   Diagnosis Date Noted  . Acute systolic (congestive) heart failure (HCC) 06/24/2018  . Aortic insufficiency 06/22/2018  . Acute on chronic congestive heart failure (HCC) 06/22/2018  . Elevated troponin 06/22/2018  . CAD (coronary artery disease) 06/22/2018  . Left bundle branch block 06/22/2018  . History of iron deficiency anemia 06/22/2018  . Acute on chronic heart failure (HCC) 06/22/2018    Current Outpatient Medications:  .  albuterol (VENTOLIN HFA) 108 (90 Base) MCG/ACT inhaler, Inhale 2 puffs into the lungs every 6 (six) hours as needed for wheezing or shortness of breath., Disp: 18 g, Rfl: 1 .  aspirin EC 81 MG tablet, Take 81 mg by mouth daily., Disp: , Rfl:  .  atorvastatin (LIPITOR) 40 MG tablet, TAKE 1 TABLET BY MOUTH DAILY AT 6 PM, Disp: 30 tablet, Rfl: 5 .  carvedilol (COREG) 6.25 MG tablet, Take 1 tablet (6.25 mg total) by mouth 2 (two) times daily., Disp: 60 tablet, Rfl: 3 .  digoxin (LANOXIN) 0.125 MG tablet, Take 1 tablet by mouth daily, Disp: 90 tablet, Rfl: 1 .  ferrous sulfate 325 (65 FE) MG tablet, Take 1 tablet (325 mg total) by mouth 2 (two) times daily with a meal., Disp: 180 tablet, Rfl: 3 .  furosemide (LASIX) 40 MG tablet, Take 1 tablet (40 mg total) by mouth daily as needed. Take 40 mg (1 tab) as needed for swelling., Disp: 30 tablet, Rfl: 3 .  sacubitril-valsartan (ENTRESTO) 49-51 MG, Take 1 tablet by mouth 2 (two) times daily., Disp: 60 tablet, Rfl: 11 .  spironolactone (ALDACTONE) 25 MG tablet, Take 1 tablet (25 mg total) by mouth at bedtime., Disp: 30 tablet, Rfl: 5 No Known Allergies    Social History   Socioeconomic History  . Marital status: Single    Spouse name: Not on file  . Number of children: Not on file  . Years of  education: Not on file  . Highest education level: Not on file  Occupational History  . Not on file  Tobacco Use  . Smoking status: Current Every Day Smoker    Packs/day: 0.50    Types: Cigarettes  . Smokeless tobacco: Never Used  Substance and Sexual Activity  . Alcohol use: Yes    Alcohol/week: 2.0 standard drinks    Types: 2 Cans of beer per week    Comment: 2 40oz beer daily  . Drug use: Yes    Types: Marijuana  . Sexual activity: Not on file  Other Topics Concern  . Not on file  Social History Narrative  . Not on file   Social Determinants of Health   Financial Resource Strain:   . Difficulty of Paying Living Expenses: Not on file  Food Insecurity:   . Worried About Programme researcher, broadcasting/film/video in the Last Year: Not on file  . Ran Out of Food in the Last Year: Not on file  Transportation Needs:   . Lack of Transportation (Medical): Not on file  . Lack of Transportation (Non-Medical): Not on file  Physical Activity:   . Days of Exercise per Week: Not on file  . Minutes of Exercise per Session: Not on file  Stress:   . Feeling of Stress : Not on file  Social  Connections:   . Frequency of Communication with Friends and Family: Not on file  . Frequency of Social Gatherings with Friends and Family: Not on file  . Attends Religious Services: Not on file  . Active Member of Clubs or Organizations: Not on file  . Attends Archivist Meetings: Not on file  . Marital Status: Not on file  Intimate Partner Violence:   . Fear of Current or Ex-Partner: Not on file  . Emotionally Abused: Not on file  . Physically Abused: Not on file  . Sexually Abused: Not on file    Physical Exam Cardiovascular:     Rate and Rhythm: Normal rate and regular rhythm.     Pulses: Normal pulses.  Pulmonary:     Effort: Pulmonary effort is normal.     Breath sounds: Normal breath sounds.  Musculoskeletal:        General: Normal range of motion.     Right lower leg: No edema.     Left  lower leg: No edema.  Skin:    General: Skin is warm and dry.     Capillary Refill: Capillary refill takes less than 2 seconds.  Neurological:     Mental Status: He is alert and oriented to person, place, and time.  Psychiatric:        Mood and Affect: Mood normal.         No future appointments.  BP 127/64 (BP Location: Right Arm, Patient Position: Sitting, Cuff Size: Normal)   Pulse 64   Resp 16   SpO2 99%   Weight yesterday-Unable to weigh Last visit weight- Unable to weigh  Mr Arave was seen at home today and reported feeling well. He denied chest pain, SOB, headache, dizziness, orthopnea, fever or cough since our last visit. He stated he has been compliant with his medications however he has not been able to weigh because the battery in his scale died. He stated he would get a new one this week when he picks up medication from the pharmacy. His medications were verified and his pillbox was refilled. I will follow up next week.   [name], EMT 06/03/19  ACTION: Home visit completed Next visit planned for 1 week

## 2019-06-08 ENCOUNTER — Other Ambulatory Visit (HOSPITAL_COMMUNITY): Payer: Self-pay | Admitting: Adult Health

## 2019-06-10 ENCOUNTER — Other Ambulatory Visit (HOSPITAL_COMMUNITY): Payer: Self-pay

## 2019-06-10 MED ORDER — CARVEDILOL 6.25 MG PO TABS: 6.2500 mg | ORAL_TABLET | Freq: Two times a day (BID) | ORAL | 1 refills | Status: DC

## 2019-06-10 NOTE — Progress Notes (Signed)
Paramedicine Encounter    Patient ID: Jeremy Powers, male    DOB: [birth year], 64 y.o.   MRN: [id number]   Patient Care Team: [facility] as PCP - General (Pediatrics)  Patient Active Problem List   Diagnosis Date Noted  . Acute systolic (congestive) heart failure (HCC) 06/24/2018  . Aortic insufficiency 06/22/2018  . Acute on chronic congestive heart failure (HCC) 06/22/2018  . Elevated troponin 06/22/2018  . CAD (coronary artery disease) 06/22/2018  . Left bundle branch block 06/22/2018  . History of iron deficiency anemia 06/22/2018  . Acute on chronic heart failure (HCC) 06/22/2018    Current Outpatient Medications:  .  atorvastatin (LIPITOR) 40 MG tablet, TAKE 1 TABLET BY MOUTH DAILY AT 6 PM, Disp: 30 tablet, Rfl: 5 .  digoxin (LANOXIN) 0.125 MG tablet, Take 1 tablet by mouth daily, Disp: 90 tablet, Rfl: 1 .  ferrous sulfate 325 (65 FE) MG tablet, Take 1 tablet (325 mg total) by mouth 2 (two) times daily with a meal., Disp: 180 tablet, Rfl: 3 .  furosemide (LASIX) 40 MG tablet, Take 1 tablet (40 mg total) by mouth daily as needed. Take 40 mg (1 tab) as needed for swelling., Disp: 30 tablet, Rfl: 3 .  sacubitril-valsartan (ENTRESTO) 49-51 MG, Take 1 tablet by mouth 2 (two) times daily., Disp: 60 tablet, Rfl: 11 .  spironolactone (ALDACTONE) 25 MG tablet, Take 1 tablet (25 mg total) by mouth at bedtime., Disp: 30 tablet, Rfl: 5 .  albuterol (VENTOLIN HFA) 108 (90 Base) MCG/ACT inhaler, Inhale 2 puffs into the lungs every 6 (six) hours as needed for wheezing or shortness of breath., Disp: 18 g, Rfl: 1 .  aspirin EC 81 MG tablet, Take 81 mg by mouth daily., Disp: , Rfl:  .  carvedilol (COREG) 6.25 MG tablet, Take 1 tablet (6.25 mg total) by mouth 2 (two) times daily., Disp: 180 tablet, Rfl: 1 No Known Allergies    Social History   Socioeconomic History  . Marital status: Single    Spouse name: Not on file  . Number of children: Not on file  . Years  of education: Not on file  . Highest education level: Not on file  Occupational History  . Not on file  Tobacco Use  . Smoking status: Current Every Day Smoker    Packs/day: 0.50    Types: Cigarettes  . Smokeless tobacco: Never Used  Substance and Sexual Activity  . Alcohol use: Yes    Alcohol/week: 2.0 standard drinks    Types: 2 Cans of beer per week    Comment: 2 40oz beer daily  . Drug use: Yes    Types: Marijuana  . Sexual activity: Not on file  Other Topics Concern  . Not on file  Social History Narrative  . Not on file   Social Determinants of Health   Financial Resource Strain:   . Difficulty of Paying Living Expenses: Not on file  Food Insecurity:   . Worried About Programme researcher, broadcasting/film/video in the Last Year: Not on file  . Ran Out of Food in the Last Year: Not on file  Transportation Needs:   . Lack of Transportation (Medical): Not on file  . Lack of Transportation (Non-Medical): Not on file  Physical Activity:   . Days of Exercise per Week: Not on file  . Minutes of Exercise per Session: Not on file  Stress:   . Feeling of Stress : Not on file  Social  Connections:   . Frequency of Communication with Friends and Family: Not on file  . Frequency of Social Gatherings with Friends and Family: Not on file  . Attends Religious Services: Not on file  . Active Member of Clubs or Organizations: Not on file  . Attends Archivist Meetings: Not on file  . Marital Status: Not on file  Intimate Partner Violence:   . Fear of Current or Ex-Partner: Not on file  . Emotionally Abused: Not on file  . Physically Abused: Not on file  . Sexually Abused: Not on file    Physical Exam Cardiovascular:     Rate and Rhythm: Regular rhythm. Bradycardia present.     Pulses: Normal pulses.  Pulmonary:     Effort: Pulmonary effort is normal.     Breath sounds: Normal breath sounds.  Abdominal:     General: Abdomen is flat. There is no distension.  Musculoskeletal:         General: Normal range of motion.     Right lower leg: Edema present.     Left lower leg: Edema present.  Skin:    General: Skin is warm and dry.     Capillary Refill: Capillary refill takes less than 2 seconds.  Neurological:     Mental Status: He is alert and oriented to person, place, and time.  Psychiatric:        Mood and Affect: Mood normal.         No future appointments.  BP (!) 124/59 (BP Location: Left Arm, Patient Position: Sitting, Cuff Size: Normal)   Pulse (!) 58   Resp 16   SpO2 100%   Weight yesterday- unable to weigh Last visit weight- Unable to weigh  Jeremy Powers was seen at home today and reported feeling well. He denied chest pain, SOB, headache, dizziness, orthopnea, fever or cough since our last visit. He reported being compliant with his medications over the past week however he has not been able to keep up with his weight because he did not buy a new scale battery. Additionally he had not bought a new bottle of aspirin so he did not have it to add to his pillbox. He has been drinking more soft drinks and not limiting sodium and as a result he was exhibiting BLEE. His medications were verified and his pillbox was refilled. I instructed him to decrease the soft drinks and salt in his food. He was understanding and agreeable. I will follow up next week.   [name], EMT 06/10/19  ACTION: Home visit completed Next visit planned for 1 week

## 2019-06-17 ENCOUNTER — Other Ambulatory Visit (HOSPITAL_COMMUNITY): Payer: Self-pay

## 2019-06-17 NOTE — Progress Notes (Signed)
Paramedicine Encounter    Patient ID: Jeremy Powers, male    DOB: 11/30/1955, 64 y.o.   MRN: [id number]   Patient Care Team: [facility], [facility] as PCP - General (Pediatrics)  Patient Active Problem List   Diagnosis Date Noted  . Acute systolic (congestive) heart failure ([place]) 06/24/2018  . Aortic insufficiency 06/22/2018  . Acute on chronic congestive heart failure ([place]) 06/22/2018  . Elevated troponin 06/22/2018  . CAD (coronary artery disease) 06/22/2018  . Left bundle branch block 06/22/2018  . History of iron deficiency anemia 06/22/2018  . Acute on chronic heart failure ([place]) 06/22/2018    Current Outpatient Medications:  .  albuterol (VENTOLIN HFA) 108 (90 Base) MCG/ACT inhaler, Inhale 2 puffs into the lungs every 6 (six) hours as needed for wheezing or shortness of breath., Disp: 18 g, Rfl: 1 .  aspirin EC 81 MG tablet, Take 81 mg by mouth daily., Disp: , Rfl:  .  atorvastatin (LIPITOR) 40 MG tablet, TAKE 1 TABLET BY MOUTH DAILY AT 6 PM, Disp: 30 tablet, Rfl: 5 .  carvedilol (COREG) 6.25 MG tablet, Take 1 tablet (6.25 mg total) by mouth 2 (two) times daily., Disp: 180 tablet, Rfl: 1 .  digoxin (LANOXIN) 0.125 MG tablet, Take 1 tablet by mouth daily, Disp: 90 tablet, Rfl: 1 .  ferrous sulfate 325 (65 FE) MG tablet, Take 1 tablet (325 mg total) by mouth 2 (two) times daily with a meal., Disp: 180 tablet, Rfl: 3 .  sacubitril-valsartan (ENTRESTO) 49-51 MG, Take 1 tablet by mouth 2 (two) times daily., Disp: 60 tablet, Rfl: 11 .  spironolactone (ALDACTONE) 25 MG tablet, Take 1 tablet (25 mg total) by mouth at bedtime., Disp: 30 tablet, Rfl: 5 .  furosemide (LASIX) 40 MG tablet, Take 1 tablet (40 mg total) by mouth daily as needed. Take 40 mg (1 tab) as needed for swelling., Disp: 30 tablet, Rfl: 3 No Known Allergies    Social History   Socioeconomic History  . Marital status: Single    Spouse name: Not on file  . Number of children: Not on file  . Years  of education: Not on file  . Highest education level: Not on file  Occupational History  . Not on file  Tobacco Use  . Smoking status: Current Every Day Smoker    Packs/day: 0.50    Types: Cigarettes  . Smokeless tobacco: Never Used  Substance and Sexual Activity  . Alcohol use: Yes    Alcohol/week: 2.0 standard drinks    Types: 2 Cans of beer per week    Comment: 2 40oz beer daily  . Drug use: Yes    Types: Marijuana  . Sexual activity: Not on file  Other Topics Concern  . Not on file  Social History Narrative  . Not on file   Social Determinants of Health   Financial Resource Strain:   . Difficulty of Paying Living Expenses: Not on file  Food Insecurity:   . Worried About Charity fundraiser in the Last Year: Not on file  . Ran Out of Food in the Last Year: Not on file  Transportation Needs:   . Lack of Transportation (Medical): Not on file  . Lack of Transportation (Non-Medical): Not on file  Physical Activity:   . Days of Exercise per Week: Not on file  . Minutes of Exercise per Session: Not on file  Stress:   . Feeling of Stress : Not on file  Social  Connections:   . Frequency of Communication with Friends and Family: Not on file  . Frequency of Social Gatherings with Friends and Family: Not on file  . Attends Religious Services: Not on file  . Active Member of Clubs or Organizations: Not on file  . Attends Banker Meetings: Not on file  . Marital Status: Not on file  Intimate Partner Violence:   . Fear of Current or Ex-Partner: Not on file  . Emotionally Abused: Not on file  . Physically Abused: Not on file  . Sexually Abused: Not on file    Physical Exam      No future appointments.  BP 124/62   Pulse (!) 58   Temp (!) 97.3 F (36.3 C)   Resp 16   SpO2 98%   Pt reports he has been feeling ok, he has not been able to weigh due to him needing batteries.  meds verified and pill box refilled.  His iron tablets had listed only 16  tablets for him to take BID.  Called pharmacy and they advised they are trying to get him on a sch to get his meds all at one time monthly so it was a partial fill.  He did have edema to his legs. He was not sure how he was taking his lasix as the epic note says he was taking it PRN but zack confirmed he should be taking it daily so that was placed in his pill box.   Kerry Hough, EMT-Paramedic [phone] [facility] Paramedic  06/17/19

## 2019-06-24 ENCOUNTER — Other Ambulatory Visit (HOSPITAL_COMMUNITY): Payer: Self-pay

## 2019-06-24 NOTE — Progress Notes (Signed)
Paramedicine Encounter    Patient ID: Jeremy Powers, male    DOB: [birth year], 64 y.o.   MRN: [id number]   Patient Care Team: [facility] as PCP - General (Pediatrics)  Patient Active Problem List   Diagnosis Date Noted  . Acute systolic (congestive) heart failure ([facility]) 06/24/2018  . Aortic insufficiency 06/22/2018  . Acute on chronic congestive heart failure ([facility]) 06/22/2018  . Elevated troponin 06/22/2018  . CAD (coronary artery disease) 06/22/2018  . Left bundle branch block 06/22/2018  . History of iron deficiency anemia 06/22/2018  . Acute on chronic heart failure ([facility]) 06/22/2018    Current Outpatient Medications:  .  albuterol (VENTOLIN HFA) 108 (90 Base) MCG/ACT inhaler, Inhale 2 puffs into the lungs every 6 (six) hours as needed for wheezing or shortness of breath., Disp: 18 g, Rfl: 1 .  aspirin EC 81 MG tablet, Take 81 mg by mouth daily., Disp: , Rfl:  .  atorvastatin (LIPITOR) 40 MG tablet, TAKE 1 TABLET BY MOUTH DAILY AT 6 PM, Disp: 30 tablet, Rfl: 5 .  carvedilol (COREG) 6.25 MG tablet, Take 1 tablet (6.25 mg total) by mouth 2 (two) times daily., Disp: 180 tablet, Rfl: 1 .  digoxin (LANOXIN) 0.125 MG tablet, Take 1 tablet by mouth daily, Disp: 90 tablet, Rfl: 1 .  ferrous sulfate 325 (65 FE) MG tablet, Take 1 tablet (325 mg total) by mouth 2 (two) times daily with a meal., Disp: 180 tablet, Rfl: 3 .  furosemide (LASIX) 40 MG tablet, Take 1 tablet (40 mg total) by mouth daily as needed. Take 40 mg (1 tab) as needed for swelling., Disp: 30 tablet, Rfl: 3 .  sacubitril-valsartan (ENTRESTO) 49-51 MG, Take 1 tablet by mouth 2 (two) times daily., Disp: 60 tablet, Rfl: 11 .  spironolactone (ALDACTONE) 25 MG tablet, Take 1 tablet (25 mg total) by mouth at bedtime., Disp: 30 tablet, Rfl: 5 No Known Allergies    Social History   Socioeconomic History  . Marital status: Single    Spouse name: Not on file  . Number of children: Not on file  . Years  of education: Not on file  . Highest education level: Not on file  Occupational History  . Not on file  Tobacco Use  . Smoking status: Current Every Day Smoker    Packs/day: 0.50    Types: Cigarettes  . Smokeless tobacco: Never Used  Substance and Sexual Activity  . Alcohol use: Yes    Alcohol/week: 2.0 standard drinks    Types: 2 Cans of beer per week    Comment: 2 40oz beer daily  . Drug use: Yes    Types: Marijuana  . Sexual activity: Not on file  Other Topics Concern  . Not on file  Social History Narrative  . Not on file   Social Determinants of Health   Financial Resource Strain:   . Difficulty of Paying Living Expenses: Not on file  Food Insecurity:   . Worried About Programme researcher, broadcasting/film/video in the Last Year: Not on file  . Ran Out of Food in the Last Year: Not on file  Transportation Needs:   . Lack of Transportation (Medical): Not on file  . Lack of Transportation (Non-Medical): Not on file  Physical Activity:   . Days of Exercise per Week: Not on file  . Minutes of Exercise per Session: Not on file  Stress:   . Feeling of Stress : Not on file  Social  Connections:   . Frequency of Communication with Friends and Family: Not on file  . Frequency of Social Gatherings with Friends and Family: Not on file  . Attends Religious Services: Not on file  . Active Member of Clubs or Organizations: Not on file  . Attends Archivist Meetings: Not on file  . Marital Status: Not on file  Intimate Partner Violence:   . Fear of Current or Ex-Partner: Not on file  . Emotionally Abused: Not on file  . Physically Abused: Not on file  . Sexually Abused: Not on file    Physical Exam Cardiovascular:     Rate and Rhythm: Normal rate and regular rhythm.     Pulses: Normal pulses.  Pulmonary:     Effort: Pulmonary effort is normal.     Breath sounds: Normal breath sounds.  Musculoskeletal:        General: Normal range of motion.     Right lower leg: No edema.     Left  lower leg: No edema.  Skin:    General: Skin is warm and dry.     Capillary Refill: Capillary refill takes less than 2 seconds.  Neurological:     Mental Status: He is alert and oriented to person, place, and time.  Psychiatric:        Mood and Affect: Mood normal.         No future appointments.  BP (!) 118/58 (BP Location: Left Arm, Patient Position: Sitting, Cuff Size: Normal)   Pulse (!) 58   Resp 16   Wt 132 lb (59.9 kg)   SpO2 99%   BMI 21.31 kg/m   Weight yesterday- did not weigh Last visit weight- did not weigh  Jeremy Powers was seen at home today and reported feeling well. He denied chest pain, SOB, headache, dizziness, orthopnea, fever or cough since our last visit. He stated he has been compliant with his medications over the past week though he has not been able to weigh due to dead batteries in his scale. Today I was able to provide him with a battery, courtesy of [employer], so he will be able to weigh moving forward. His medications were verified and his pillbox was refilled. He ran out of iron so I ordered a refill and he said he would call me when he picks it up so I can return and put it in his pillbox. I will follow up when he calls.   Jeremy Powers, EMT 06/24/19  ACTION: Home visit completed Next visit planned for 1 week

## 2019-06-25 ENCOUNTER — Other Ambulatory Visit (HOSPITAL_COMMUNITY): Payer: Self-pay

## 2019-06-25 NOTE — Progress Notes (Signed)
Jeremy Powers was seen at home today after he picked up his medications from the pharmacy. Iron was added to his box and nothing further was needed at this time. I will follow up next week.   Jacqualine Code, EMT 06/25/19

## 2019-07-01 ENCOUNTER — Other Ambulatory Visit (HOSPITAL_COMMUNITY): Payer: Self-pay

## 2019-07-01 NOTE — Progress Notes (Signed)
Paramedicine Encounter    Patient ID: Jeremy Powers, male    DOB: [birth year], 64 y.o.   MRN: [id number]   Patient Care Team: [facility] as PCP - General (Pediatrics)  Patient Active Problem List   Diagnosis Date Noted  . Acute systolic (congestive) heart failure ([facility]) 06/24/2018  . Aortic insufficiency 06/22/2018  . Acute on chronic congestive heart failure ([facility]) 06/22/2018  . Elevated troponin 06/22/2018  . CAD (coronary artery disease) 06/22/2018  . Left bundle branch block 06/22/2018  . History of iron deficiency anemia 06/22/2018  . Acute on chronic heart failure ([facility]) 06/22/2018    Current Outpatient Medications:  .  albuterol (VENTOLIN HFA) 108 (90 Base) MCG/ACT inhaler, Inhale 2 puffs into the lungs every 6 (six) hours as needed for wheezing or shortness of breath., Disp: 18 g, Rfl: 1 .  aspirin EC 81 MG tablet, Take 81 mg by mouth daily., Disp: , Rfl:  .  atorvastatin (LIPITOR) 40 MG tablet, TAKE 1 TABLET BY MOUTH DAILY AT 6 PM, Disp: 30 tablet, Rfl: 5 .  carvedilol (COREG) 6.25 MG tablet, Take 1 tablet (6.25 mg total) by mouth 2 (two) times daily., Disp: 180 tablet, Rfl: 1 .  digoxin (LANOXIN) 0.125 MG tablet, Take 1 tablet by mouth daily, Disp: 90 tablet, Rfl: 1 .  ferrous sulfate 325 (65 FE) MG tablet, Take 1 tablet (325 mg total) by mouth 2 (two) times daily with a meal., Disp: 180 tablet, Rfl: 3 .  furosemide (LASIX) 40 MG tablet, Take 1 tablet (40 mg total) by mouth daily as needed. Take 40 mg (1 tab) as needed for swelling., Disp: 30 tablet, Rfl: 3 .  sacubitril-valsartan (ENTRESTO) 49-51 MG, Take 1 tablet by mouth 2 (two) times daily., Disp: 60 tablet, Rfl: 11 .  spironolactone (ALDACTONE) 25 MG tablet, Take 1 tablet (25 mg total) by mouth at bedtime., Disp: 30 tablet, Rfl: 5 No Known Allergies    Social History   Socioeconomic History  . Marital status: Single    Spouse name: Not on file  . Number of children: Not on file  . Years  of education: Not on file  . Highest education level: Not on file  Occupational History  . Not on file  Tobacco Use  . Smoking status: Current Every Day Smoker    Packs/day: 0.50    Types: Cigarettes  . Smokeless tobacco: Never Used  Substance and Sexual Activity  . Alcohol use: Yes    Alcohol/week: 2.0 standard drinks    Types: 2 Cans of beer per week    Comment: 2 40oz beer daily  . Drug use: Yes    Types: Marijuana  . Sexual activity: Not on file  Other Topics Concern  . Not on file  Social History Narrative  . Not on file   Social Determinants of Health   Financial Resource Strain:   . Difficulty of Paying Living Expenses: Not on file  Food Insecurity:   . Worried About Programme researcher, broadcasting/film/video in the Last Year: Not on file  . Ran Out of Food in the Last Year: Not on file  Transportation Needs:   . Lack of Transportation (Medical): Not on file  . Lack of Transportation (Non-Medical): Not on file  Physical Activity:   . Days of Exercise per Week: Not on file  . Minutes of Exercise per Session: Not on file  Stress:   . Feeling of Stress : Not on file  Social  Connections:   . Frequency of Communication with Friends and Family: Not on file  . Frequency of Social Gatherings with Friends and Family: Not on file  . Attends Religious Services: Not on file  . Active Member of Clubs or Organizations: Not on file  . Attends Banker Meetings: Not on file  . Marital Status: Not on file  Intimate Partner Violence:   . Fear of Current or Ex-Partner: Not on file  . Emotionally Abused: Not on file  . Physically Abused: Not on file  . Sexually Abused: Not on file    Physical Exam Cardiovascular:     Rate and Rhythm: Normal rate and regular rhythm.     Pulses: Normal pulses.  Pulmonary:     Effort: Pulmonary effort is normal.     Breath sounds: Normal breath sounds.  Abdominal:     General: Abdomen is flat. There is no distension.  Musculoskeletal:         General: Normal range of motion.     Right lower leg: No edema.     Left lower leg: No edema.  Skin:    General: Skin is warm and dry.     Capillary Refill: Capillary refill takes less than 2 seconds.  Neurological:     Mental Status: He is alert and oriented to person, place, and time.  Psychiatric:        Mood and Affect: Mood normal.         No future appointments.  BP (!) 107/52 (BP Location: Right Arm, Patient Position: Sitting, Cuff Size: Normal)   Pulse 62   Resp 16   Wt 136 lb 6.4 oz (61.9 kg)   SpO2 99%   BMI 22.02 kg/m   Weight yesterday-136.2 lb Last visit weight- 132 lb  Mr Brau was seen at home today and reported feeling generally well. She denied chest pain, SOB, headache, dizziness, orthopnea, fever or cough in the past week. He reported being compliant with his medications over the past week and his weight has been stable. His medications were verified and his pillbox was refilled. I will follow up next week.   Jacqualine Code, EMT 07/01/19  ACTION: Home visit completed Next visit planned for 1 week

## 2019-07-08 ENCOUNTER — Other Ambulatory Visit (HOSPITAL_COMMUNITY): Payer: Self-pay

## 2019-07-08 NOTE — Progress Notes (Signed)
Paramedicine Encounter    Patient ID: Jeremy Powers, male    DOB: [birth year], 64 y.o.   MRN: [id number]   Patient Care Team: [facility] as PCP - General (Pediatrics)  Patient Active Problem List   Diagnosis Date Noted  . Acute systolic (congestive) heart failure (HCC) 06/24/2018  . Aortic insufficiency 06/22/2018  . Acute on chronic congestive heart failure (HCC) 06/22/2018  . Elevated troponin 06/22/2018  . CAD (coronary artery disease) 06/22/2018  . Left bundle branch block 06/22/2018  . History of iron deficiency anemia 06/22/2018  . Acute on chronic heart failure (H[facility]) 06/22/2018    Current Outpatient Medications:  .  albuterol (VENTOLIN HFA) 108 (90 Base) MCG/ACT inhaler, Inhale 2 puffs into the lungs every 6 (six) hours as needed for wheezing or shortness of breath., Disp: 18 g, Rfl: 1 .  aspirin EC 81 MG tablet, Take 81 mg by mouth daily., Disp: , Rfl:  .  atorvastatin (LIPITOR) 40 MG tablet, TAKE 1 TABLET BY MOUTH DAILY AT 6 PM, Disp: 30 tablet, Rfl: 5 .  carvedilol (COREG) 6.25 MG tablet, Take 1 tablet (6.25 mg total) by mouth 2 (two) times daily., Disp: 180 tablet, Rfl: 1 .  digoxin (LANOXIN) 0.125 MG tablet, Take 1 tablet by mouth daily, Disp: 90 tablet, Rfl: 1 .  ferrous sulfate 325 (65 FE) MG tablet, Take 1 tablet (325 mg total) by mouth 2 (two) times daily with a meal., Disp: 180 tablet, Rfl: 3 .  furosemide (LASIX) 40 MG tablet, Take 1 tablet (40 mg total) by mouth daily as needed. Take 40 mg (1 tab) as needed for swelling., Disp: 30 tablet, Rfl: 3 .  sacubitril-valsartan (ENTRESTO) 49-51 MG, Take 1 tablet by mouth 2 (two) times daily., Disp: 60 tablet, Rfl: 11 .  spironolactone (ALDACTONE) 25 MG tablet, Take 1 tablet (25 mg total) by mouth at bedtime., Disp: 30 tablet, Rfl: 5 No Known Allergies    Social History   Socioeconomic History  . Marital status: Single    Spouse name: Not on file  . Number of children: Not on file  . Years  of education: Not on file  . Highest education level: Not on file  Occupational History  . Not on file  Tobacco Use  . Smoking status: Current Every Day Smoker    Packs/day: 0.50    Types: Cigarettes  . Smokeless tobacco: Never Used  Substance and Sexual Activity  . Alcohol use: Yes    Alcohol/week: 2.0 standard drinks    Types: 2 Cans of beer per week    Comment: 2 40oz beer daily  . Drug use: Yes    Types: Marijuana  . Sexual activity: Not on file  Other Topics Concern  . Not on file  Social History Narrative  . Not on file   Social Determinants of Health   Financial Resource Strain:   . Difficulty of Paying Living Expenses: Not on file  Food Insecurity:   . Worried About Programme researcher, broadcasting/film/video in the Last Year: Not on file  . Ran Out of Food in the Last Year: Not on file  Transportation Needs:   . Lack of Transportation (Medical): Not on file  . Lack of Transportation (Non-Medical): Not on file  Physical Activity:   . Days of Exercise per Week: Not on file  . Minutes of Exercise per Session: Not on file  Stress:   . Feeling of Stress : Not on file  Social  Connections:   . Frequency of Communication with Friends and Family: Not on file  . Frequency of Social Gatherings with Friends and Family: Not on file  . Attends Religious Services: Not on file  . Active Member of Clubs or Organizations: Not on file  . Attends Banker Meetings: Not on file  . Marital Status: Not on file  Intimate Partner Violence:   . Fear of Current or Ex-Partner: Not on file  . Emotionally Abused: Not on file  . Physically Abused: Not on file  . Sexually Abused: Not on file    Physical Exam Cardiovascular:     Rate and Rhythm: Regular rhythm. Bradycardia present.     Pulses: Normal pulses.  Pulmonary:     Effort: Pulmonary effort is normal.     Breath sounds: Normal breath sounds.  Musculoskeletal:        General: Normal range of motion.     Right lower leg: No edema.      Left lower leg: No edema.  Skin:    General: Skin is warm and dry.     Capillary Refill: Capillary refill takes less than 2 seconds.  Neurological:     Mental Status: He is alert and oriented to person, place, and time.  Psychiatric:        Mood and Affect: Mood normal.         No future appointments.  BP 120/60 (BP Location: Right Arm, Patient Position: Sitting, Cuff Size: Normal)   Pulse (!) 56   Resp 16   Wt 137 lb 12.8 oz (62.5 kg)   SpO2 98%   BMI 22.24 kg/m   Weight yesterday- 134.8 lb Last visit weight- 136 lb  Jeremy Powers was seen at home today and rpeorted feeling well. He denied chest pain, SOB, headache, dizziness, orthopnea, fever or cough since our last visit. He stated he has been compliant with his medications over the past week and his weigh thas been stable. His medications were verified and his pillbox was refilled. I will follow up next week.   Jacqualine Code, EMT 07/08/19  ACTION: Home visit completed Next visit planned for 1 week

## 2019-07-14 ENCOUNTER — Telehealth (HOSPITAL_COMMUNITY): Payer: Self-pay

## 2019-07-14 NOTE — Telephone Encounter (Signed)
I called Mr Mcewan to schedule an appointment. He stated he would be available tomorrow morning so we agreed to meet at 08:30.   Jacqualine Code, EMT 07/14/19

## 2019-07-15 ENCOUNTER — Other Ambulatory Visit (HOSPITAL_COMMUNITY): Payer: Self-pay

## 2019-07-15 NOTE — Progress Notes (Signed)
Paramedicine Encounter    Patient ID: Jeremy Powers, male    DOB: [birth year], 64 y.o.   MRN: [id number]   Patient Care Team: [facility] as PCP - General (Pediatrics)  Patient Active Problem List   Diagnosis Date Noted  . Acute systolic (congestive) heart failure (HCC) 06/24/2018  . Aortic insufficiency 06/22/2018  . Acute on chronic congestive heart failure (HCC) 06/22/2018  . Elevated troponin 06/22/2018  . CAD (coronary artery disease) 06/22/2018  . Left bundle branch block 06/22/2018  . History of iron deficiency anemia 06/22/2018  . Acute on chronic heart failure (HCC) 06/22/2018    Current Outpatient Medications:  .  albuterol (VENTOLIN HFA) 108 (90 Base) MCG/ACT inhaler, Inhale 2 puffs into the lungs every 6 (six) hours as needed for wheezing or shortness of breath., Disp: 18 g, Rfl: 1 .  aspirin EC 81 MG tablet, Take 81 mg by mouth daily., Disp: , Rfl:  .  atorvastatin (LIPITOR) 40 MG tablet, TAKE 1 TABLET BY MOUTH DAILY AT 6 PM, Disp: 30 tablet, Rfl: 5 .  carvedilol (COREG) 6.25 MG tablet, Take 1 tablet (6.25 mg total) by mouth 2 (two) times daily., Disp: 180 tablet, Rfl: 1 .  digoxin (LANOXIN) 0.125 MG tablet, Take 1 tablet by mouth daily, Disp: 90 tablet, Rfl: 1 .  ferrous sulfate 325 (65 FE) MG tablet, Take 1 tablet (325 mg total) by mouth 2 (two) times daily with a meal., Disp: 180 tablet, Rfl: 3 .  furosemide (LASIX) 40 MG tablet, Take 1 tablet (40 mg total) by mouth daily as needed. Take 40 mg (1 tab) as needed for swelling., Disp: 30 tablet, Rfl: 3 .  sacubitril-valsartan (ENTRESTO) 49-51 MG, Take 1 tablet by mouth 2 (two) times daily., Disp: 60 tablet, Rfl: 11 .  spironolactone (ALDACTONE) 25 MG tablet, Take 1 tablet (25 mg total) by mouth at bedtime., Disp: 30 tablet, Rfl: 5 No Known Allergies    Social History   Socioeconomic History  . Marital status: Single    Spouse name: Not on file  . Number of children: Not on file  . Years  of education: Not on file  . Highest education level: Not on file  Occupational History  . Not on file  Tobacco Use  . Smoking status: Current Every Day Smoker    Packs/day: 0.50    Types: Cigarettes  . Smokeless tobacco: Never Used  Substance and Sexual Activity  . Alcohol use: Yes    Alcohol/week: 2.0 standard drinks    Types: 2 Cans of beer per week    Comment: 2 40oz beer daily  . Drug use: Yes    Types: Marijuana  . Sexual activity: Not on file  Other Topics Concern  . Not on file  Social History Narrative  . Not on file   Social Determinants of Health   Financial Resource Strain:   . Difficulty of Paying Living Expenses: Not on file  Food Insecurity:   . Worried About Programme researcher, broadcasting/film/video in the Last Year: Not on file  . Ran Out of Food in the Last Year: Not on file  Transportation Needs:   . Lack of Transportation (Medical): Not on file  . Lack of Transportation (Non-Medical): Not on file  Physical Activity:   . Days of Exercise per Week: Not on file  . Minutes of Exercise per Session: Not on file  Stress:   . Feeling of Stress : Not on file  Social  Connections:   . Frequency of Communication with Friends and Family: Not on file  . Frequency of Social Gatherings with Friends and Family: Not on file  . Attends Religious Services: Not on file  . Active Member of Clubs or Organizations: Not on file  . Attends Banker Meetings: Not on file  . Marital Status: Not on file  Intimate Partner Violence:   . Fear of Current or Ex-Partner: Not on file  . Emotionally Abused: Not on file  . Physically Abused: Not on file  . Sexually Abused: Not on file    Physical Exam Cardiovascular:     Rate and Rhythm: Normal rate and regular rhythm.     Pulses: Normal pulses.  Pulmonary:     Effort: Pulmonary effort is normal.     Breath sounds: Normal breath sounds.  Abdominal:     General: Abdomen is flat. There is no distension.  Musculoskeletal:         General: Normal range of motion.     Right lower leg: No edema.     Left lower leg: No edema.  Skin:    General: Skin is warm and dry.     Capillary Refill: Capillary refill takes less than 2 seconds.  Neurological:     General: No focal deficit present.     Mental Status: He is alert and oriented to person, place, and time.  Psychiatric:        Mood and Affect: Mood normal.         No future appointments.  BP (!) 110/50 (BP Location: Right Arm, Patient Position: Sitting, Cuff Size: Normal)   Pulse 60   Resp 16   Wt 137 lb 9.6 oz (62.4 kg)   SpO2 96%   BMI 22.21 kg/m   Weight yesterday- 139.8 lb Last visit weight- 137.8 lb  Jeremy Powers was seen at home today and reported feeling well. He denied chest pain, SOB, headache, dizziness, orthopnea, cough or fever. He reported being compliant with his medications over the past week and his weight has been stable. His medications were verified but he ran out of entresto. I retrieved it from the pharmacy along with 5 other medications and finished filling his pillbox. I will follow up next week.   Jacqualine Code, EMT 07/15/19  ACTION: Home visit completed Next visit planned for 1 week

## 2019-07-22 ENCOUNTER — Other Ambulatory Visit (HOSPITAL_COMMUNITY): Payer: Self-pay

## 2019-07-22 NOTE — Progress Notes (Signed)
Paramedicine Encounter    Patient ID: Jeremy Powers, male    DOB: [birth year], 64 y.o.   MRN: [id number]   Patient Care Team: [facility] as PCP - General (Pediatrics)  Patient Active Problem List   Diagnosis Date Noted  . Acute systolic (congestive) heart failure (HCC) 06/24/2018  . Aortic insufficiency 06/22/2018  . Acute on chronic congestive heart failure (HCC) 06/22/2018  . Elevated troponin 06/22/2018  . CAD (coronary artery disease) 06/22/2018  . Left bundle branch block 06/22/2018  . History of iron deficiency anemia 06/22/2018  . Acute on chronic heart failure (HCC) 06/22/2018    Current Outpatient Medications:  .  albuterol (VENTOLIN HFA) 108 (90 Base) MCG/ACT inhaler, Inhale 2 puffs into the lungs every 6 (six) hours as needed for wheezing or shortness of breath., Disp: 18 g, Rfl: 1 .  aspirin EC 81 MG tablet, Take 81 mg by mouth daily., Disp: , Rfl:  .  atorvastatin (LIPITOR) 40 MG tablet, TAKE 1 TABLET BY MOUTH DAILY AT 6 PM, Disp: 30 tablet, Rfl: 5 .  carvedilol (COREG) 6.25 MG tablet, Take 1 tablet (6.25 mg total) by mouth 2 (two) times daily., Disp: 180 tablet, Rfl: 1 .  digoxin (LANOXIN) 0.125 MG tablet, Take 1 tablet by mouth daily, Disp: 90 tablet, Rfl: 1 .  ferrous sulfate 325 (65 FE) MG tablet, Take 1 tablet (325 mg total) by mouth 2 (two) times daily with a meal., Disp: 180 tablet, Rfl: 3 .  furosemide (LASIX) 40 MG tablet, Take 1 tablet (40 mg total) by mouth daily as needed. Take 40 mg (1 tab) as needed for swelling., Disp: 30 tablet, Rfl: 3 .  sacubitril-valsartan (ENTRESTO) 49-51 MG, Take 1 tablet by mouth 2 (two) times daily., Disp: 60 tablet, Rfl: 11 .  spironolactone (ALDACTONE) 25 MG tablet, Take 1 tablet (25 mg total) by mouth at bedtime., Disp: 30 tablet, Rfl: 5 No Known Allergies    Social History   Socioeconomic History  . Marital status: Single    Spouse name: Not on file  . Number of children: Not on file  . Years  of education: Not on file  . Highest education level: Not on file  Occupational History  . Not on file  Tobacco Use  . Smoking status: Current Every Day Smoker    Packs/day: 0.50    Types: Cigarettes  . Smokeless tobacco: Never Used  Substance and Sexual Activity  . Alcohol use: Yes    Alcohol/week: 2.0 standard drinks    Types: 2 Cans of beer per week    Comment: 2 40oz beer daily  . Drug use: Yes    Types: Marijuana  . Sexual activity: Not on file  Other Topics Concern  . Not on file  Social History Narrative  . Not on file   Social Determinants of Health   Financial Resource Strain:   . Difficulty of Paying Living Expenses: Not on file  Food Insecurity:   . Worried About Programme researcher, broadcasting/film/video in the Last Year: Not on file  . Ran Out of Food in the Last Year: Not on file  Transportation Needs:   . Lack of Transportation (Medical): Not on file  . Lack of Transportation (Non-Medical): Not on file  Physical Activity:   . Days of Exercise per Week: Not on file  . Minutes of Exercise per Session: Not on file  Stress:   . Feeling of Stress : Not on file  Social  Connections:   . Frequency of Communication with Friends and Family: Not on file  . Frequency of Social Gatherings with Friends and Family: Not on file  . Attends Religious Services: Not on file  . Active Member of Clubs or Organizations: Not on file  . Attends Archivist Meetings: Not on file  . Marital Status: Not on file  Intimate Partner Violence:   . Fear of Current or Ex-Partner: Not on file  . Emotionally Abused: Not on file  . Physically Abused: Not on file  . Sexually Abused: Not on file    Physical Exam Cardiovascular:     Rate and Rhythm: Normal rate and regular rhythm.     Pulses: Normal pulses.  Pulmonary:     Effort: Pulmonary effort is normal.     Breath sounds: Normal breath sounds.  Musculoskeletal:        General: Normal range of motion.     Right lower leg: No edema.     Left  lower leg: No edema.  Skin:    General: Skin is warm and dry.     Capillary Refill: Capillary refill takes less than 2 seconds.  Neurological:     Mental Status: He is alert and oriented to person, place, and time.  Psychiatric:        Mood and Affect: Mood normal.         No future appointments.  BP 118/60 (BP Location: Left Arm, Patient Position: Sitting, Cuff Size: Normal)   Pulse (!) 54   Resp 16   Wt 140 lb 9.6 oz (63.8 kg)   SpO2 97%   BMI 22.69 kg/m   Weight yesterday- 138.6 lb Last visit weight- 137 lb  Jeremy Powers was seen at home today and reported feeling well. He denied chest pain, SOB, headache, dizziness, orthopnea, fever or cough since our last visit. He reported being compliant with his medications over the past week and his weight has been stable. His medications were verified and his pillbox was refilled. I will follow up next week.   [name], EMT 07/22/19  ACTION: Home visit completed Next visit planned for 1 week

## 2019-07-29 ENCOUNTER — Other Ambulatory Visit (HOSPITAL_COMMUNITY): Payer: Self-pay

## 2019-07-29 NOTE — Progress Notes (Signed)
Paramedicine Encounter    Patient ID: Jeremy Powers, male    DOB: 1955/07/15, 64 y.o.   MRN: [id number]   Patient Care Team: [facility] as PCP - General (Pediatrics)  Patient Active Problem List   Diagnosis Date Noted  . Acute systolic (congestive) heart failure (HCC) 06/24/2018  . Aortic insufficiency 06/22/2018  . Acute on chronic congestive heart failure (HCC) 06/22/2018  . Elevated troponin 06/22/2018  . CAD (coronary artery disease) 06/22/2018  . Left bundle branch block 06/22/2018  . History of iron deficiency anemia 06/22/2018  . Acute on chronic heart failure (HCC) 06/22/2018    Current Outpatient Medications:  .  albuterol (VENTOLIN HFA) 108 (90 Base) MCG/ACT inhaler, Inhale 2 puffs into the lungs every 6 (six) hours as needed for wheezing or shortness of breath., Disp: 18 g, Rfl: 1 .  aspirin EC 81 MG tablet, Take 81 mg by mouth daily., Disp: , Rfl:  .  atorvastatin (LIPITOR) 40 MG tablet, TAKE 1 TABLET BY MOUTH DAILY AT 6 PM, Disp: 30 tablet, Rfl: 5 .  carvedilol (COREG) 6.25 MG tablet, Take 1 tablet (6.25 mg total) by mouth 2 (two) times daily., Disp: 180 tablet, Rfl: 1 .  digoxin (LANOXIN) 0.125 MG tablet, Take 1 tablet by mouth daily, Disp: 90 tablet, Rfl: 1 .  ferrous sulfate 325 (65 FE) MG tablet, Take 1 tablet (325 mg total) by mouth 2 (two) times daily with a meal., Disp: 180 tablet, Rfl: 3 .  furosemide (LASIX) 40 MG tablet, Take 1 tablet (40 mg total) by mouth daily as needed. Take 40 mg (1 tab) as needed for swelling., Disp: 30 tablet, Rfl: 3 .  sacubitril-valsartan (ENTRESTO) 49-51 MG, Take 1 tablet by mouth 2 (two) times daily., Disp: 60 tablet, Rfl: 11 .  spironolactone (ALDACTONE) 25 MG tablet, Take 1 tablet (25 mg total) by mouth at bedtime., Disp: 30 tablet, Rfl: 5 No Known Allergies    Social History   Socioeconomic History  . Marital status: Single    Spouse name: Not on file  . Number of children: Not on file  . Years  of education: Not on file  . Highest education level: Not on file  Occupational History  . Not on file  Tobacco Use  . Smoking status: Current Every Day Smoker    Packs/day: 0.50    Types: Cigarettes  . Smokeless tobacco: Never Used  Substance and Sexual Activity  . Alcohol use: Yes    Alcohol/week: 2.0 standard drinks    Types: 2 Cans of beer per week    Comment: 2 40oz beer daily  . Drug use: Yes    Types: Marijuana  . Sexual activity: Not on file  Other Topics Concern  . Not on file  Social History Narrative  . Not on file   Social Determinants of Health   Financial Resource Strain:   . Difficulty of Paying Living Expenses: Not on file  Food Insecurity:   . Worried About Programme researcher, broadcasting/film/video in the Last Year: Not on file  . Ran Out of Food in the Last Year: Not on file  Transportation Needs:   . Lack of Transportation (Medical): Not on file  . Lack of Transportation (Non-Medical): Not on file  Physical Activity:   . Days of Exercise per Week: Not on file  . Minutes of Exercise per Session: Not on file  Stress:   . Feeling of Stress : Not on file  Social  Connections:   . Frequency of Communication with Friends and Family: Not on file  . Frequency of Social Gatherings with Friends and Family: Not on file  . Attends Religious Services: Not on file  . Active Member of Clubs or Organizations: Not on file  . Attends Archivist Meetings: Not on file  . Marital Status: Not on file  Intimate Partner Violence:   . Fear of Current or Ex-Partner: Not on file  . Emotionally Abused: Not on file  . Physically Abused: Not on file  . Sexually Abused: Not on file    Physical Exam Cardiovascular:     Rate and Rhythm: Normal rate and regular rhythm.     Pulses: Normal pulses.  Pulmonary:     Effort: Pulmonary effort is normal.     Breath sounds: Normal breath sounds.  Musculoskeletal:        General: Normal range of motion.     Right lower leg: No edema.     Left  lower leg: No edema.  Skin:    General: Skin is warm and dry.     Capillary Refill: Capillary refill takes less than 2 seconds.  Neurological:     Mental Status: He is alert and oriented to person, place, and time.  Psychiatric:        Mood and Affect: Mood normal.         No future appointments.  BP 130/60 (BP Location: Left Arm, Patient Position: Sitting, Cuff Size: Normal)   Pulse (!) 55   Resp 16   Wt 139 lb 3.2 oz (63.1 kg)   SpO2 98%   BMI 22.47 kg/m   Weight yesterday- 142 lb Last visit weight- 140 lb  Mr Jeremy Powers was seen at home today and reported feeling well. He denied chest pain, Sob, headache, dizziness, orthopnea, fever or cough over the past week. He stated has been compliant with his medications over the past week and his weight has been stable. His medications were verified and his pillbox was refilled. I will follow up next week.   [name], EMT 07/29/19  ACTION: Home visit completed Next visit planned for 1 week

## 2019-08-05 ENCOUNTER — Other Ambulatory Visit (HOSPITAL_COMMUNITY): Payer: Self-pay

## 2019-08-05 NOTE — Progress Notes (Signed)
Paramedicine Encounter    Patient ID: Jeremy Powers, male    DOB: 1955-09-22, 64 y.o.   MRN: [id number]   Patient Care Team: [facility] as PCP - General (Pediatrics)  Patient Active Problem List   Diagnosis Date Noted  . Acute systolic (congestive) heart failure (HCC) 06/24/2018  . Aortic insufficiency 06/22/2018  . Acute on chronic congestive heart failure (HCC) 06/22/2018  . Elevated troponin 06/22/2018  . CAD (coronary artery disease) 06/22/2018  . Left bundle branch block 06/22/2018  . History of iron deficiency anemia 06/22/2018  . Acute on chronic heart failure (HCC) 06/22/2018    Current Outpatient Medications:  .  albuterol (VENTOLIN HFA) 108 (90 Base) MCG/ACT inhaler, Inhale 2 puffs into the lungs every 6 (six) hours as needed for wheezing or shortness of breath., Disp: 18 g, Rfl: 1 .  aspirin EC 81 MG tablet, Take 81 mg by mouth daily., Disp: , Rfl:  .  atorvastatin (LIPITOR) 40 MG tablet, TAKE 1 TABLET BY MOUTH DAILY AT 6 PM, Disp: 30 tablet, Rfl: 5 .  carvedilol (COREG) 6.25 MG tablet, Take 1 tablet (6.25 mg total) by mouth 2 (two) times daily., Disp: 180 tablet, Rfl: 1 .  digoxin (LANOXIN) 0.125 MG tablet, Take 1 tablet by mouth daily, Disp: 90 tablet, Rfl: 1 .  ferrous sulfate 325 (65 FE) MG tablet, Take 1 tablet (325 mg total) by mouth 2 (two) times daily with a meal., Disp: 180 tablet, Rfl: 3 .  furosemide (LASIX) 40 MG tablet, Take 1 tablet (40 mg total) by mouth daily as needed. Take 40 mg (1 tab) as needed for swelling., Disp: 30 tablet, Rfl: 3 .  sacubitril-valsartan (ENTRESTO) 49-51 MG, Take 1 tablet by mouth 2 (two) times daily., Disp: 60 tablet, Rfl: 11 .  spironolactone (ALDACTONE) 25 MG tablet, Take 1 tablet (25 mg total) by mouth at bedtime., Disp: 30 tablet, Rfl: 5 No Known Allergies    Social History   Socioeconomic History  . Marital status: Single    Spouse name: Not on file  . Number of children: Not on file  . Years  of education: Not on file  . Highest education level: Not on file  Occupational History  . Not on file  Tobacco Use  . Smoking status: Current Every Day Smoker    Packs/day: 0.50    Types: Cigarettes  . Smokeless tobacco: Never Used  Substance and Sexual Activity  . Alcohol use: Yes    Alcohol/week: 2.0 standard drinks    Types: 2 Cans of beer per week    Comment: 2 40oz beer daily  . Drug use: Yes    Types: Marijuana  . Sexual activity: Not on file  Other Topics Concern  . Not on file  Social History Narrative  . Not on file   Social Determinants of Health   Financial Resource Strain:   . Difficulty of Paying Living Expenses:   Food Insecurity:   . Worried About Programme researcher, broadcasting/film/video in the Last Year:   . Barista in the Last Year:   Transportation Needs:   . Freight forwarder (Medical):   Marland Kitchen Lack of Transportation (Non-Medical):   Physical Activity:   . Days of Exercise per Week:   . Minutes of Exercise per Session:   Stress:   . Feeling of Stress :   Social Connections:   . Frequency of Communication with Friends and Family:   . Frequency of  Social Gatherings with Friends and Family:   . Attends Religious Services:   . Active Member of Clubs or Organizations:   . Attends Archivist Meetings:   Marland Kitchen Marital Status:   Intimate Partner Violence:   . Fear of Current or Ex-Partner:   . Emotionally Abused:   Marland Kitchen Physically Abused:   . Sexually Abused:     Physical Exam Cardiovascular:     Rate and Rhythm: Normal rate and regular rhythm.     Pulses: Normal pulses.  Pulmonary:     Effort: Pulmonary effort is normal.     Breath sounds: Normal breath sounds.  Musculoskeletal:        General: Normal range of motion.     Right lower leg: No edema.     Left lower leg: No edema.  Skin:    General: Skin is warm and dry.     Capillary Refill: Capillary refill takes less than 2 seconds.  Neurological:     Mental Status: He is alert and oriented to  person, place, and time.  Psychiatric:        Mood and Affect: Mood normal.         No future appointments.  BP 136/70 (BP Location: Left Arm, Patient Position: Sitting, Cuff Size: Normal)   Pulse 63   Resp 16   Wt 137 lb 3.2 oz (62.2 kg)   SpO2 97%   BMI 22.14 kg/m   Weight yesterday- 138 lb Last visit weight- 139 lb  Jeremy Powers was seen at home today and reported feeling generally well. He denied chest pain, SOB, headaches, orthopnea, fever or cough over the past week but said he has had intermittent episodes of dizzinress. He stated they don't last long and are infrequent. I advised the next time he has one he should let me know or go some place to have his vital signs checked to see if he is having drops in his blood pressure or arrhythmia's. He was understanding and agreeable. He reported being compliant with his medications over the past week and his weight has been stable. His medications were verified and his pillbox was refilled. I will follow up next week.   [name], EMT 08/05/19  ACTION: Home visit completed Next visit planned for 1 week

## 2019-08-07 ENCOUNTER — Other Ambulatory Visit (HOSPITAL_COMMUNITY): Payer: Self-pay

## 2019-08-07 ENCOUNTER — Other Ambulatory Visit (HOSPITAL_COMMUNITY): Payer: Self-pay | Admitting: Adult Health

## 2019-08-07 MED ORDER — DIGOXIN 125 MCG PO TABS: ORAL_TABLET | ORAL | 2 refills | Status: DC

## 2019-08-11 ENCOUNTER — Telehealth (HOSPITAL_COMMUNITY): Payer: Self-pay

## 2019-08-11 NOTE — Telephone Encounter (Signed)
I called Mr Mabey to schedule and appointment. He stated he would be home tomorrow morning so I advised I would be there at 08:30. He was understanding and agreeable.   Jacqualine Code, EMT 08/11/19

## 2019-08-12 ENCOUNTER — Other Ambulatory Visit (HOSPITAL_COMMUNITY): Payer: Self-pay

## 2019-08-12 NOTE — Progress Notes (Signed)
Paramedicine Encounter    Patient ID: Jeremy Powers, male    DOB: Jun 01, 1955, 64 y.o.   MRN: [id number]   Patient Care Team: [facility] as PCP - General (Pediatrics)  Patient Active Problem List   Diagnosis Date Noted  . Acute systolic (congestive) heart failure (HCC) 06/24/2018  . Aortic insufficiency 06/22/2018  . Acute on chronic congestive heart failure (HCC) 06/22/2018  . Elevated troponin 06/22/2018  . CAD (coronary artery disease) 06/22/2018  . Left bundle branch block 06/22/2018  . History of iron deficiency anemia 06/22/2018  . Acute on chronic heart failure (HCC) 06/22/2018    Current Outpatient Medications:  .  albuterol (VENTOLIN HFA) 108 (90 Base) MCG/ACT inhaler, Inhale 2 puffs into the lungs every 6 (six) hours as needed for wheezing or shortness of breath., Disp: 18 g, Rfl: 1 .  aspirin EC 81 MG tablet, Take 81 mg by mouth daily., Disp: , Rfl:  .  atorvastatin (LIPITOR) 40 MG tablet, TAKE 1 TABLET BY MOUTH DAILY AT 6 PM, Disp: 30 tablet, Rfl: 5 .  carvedilol (COREG) 6.25 MG tablet, Take 1 tablet (6.25 mg total) by mouth 2 (two) times daily., Disp: 180 tablet, Rfl: 1 .  digoxin (LANOXIN) 0.125 MG tablet, Take 1 tablet by mouth daily, Disp: 90 tablet, Rfl: 2 .  ferrous sulfate 325 (65 FE) MG tablet, Take 1 tablet (325 mg total) by mouth 2 (two) times daily with a meal., Disp: 180 tablet, Rfl: 3 .  furosemide (LASIX) 40 MG tablet, Take 1 tablet (40 mg total) by mouth daily as needed. Take 40 mg (1 tab) as needed for swelling., Disp: 30 tablet, Rfl: 3 .  sacubitril-valsartan (ENTRESTO) 49-51 MG, Take 1 tablet by mouth 2 (two) times daily., Disp: 60 tablet, Rfl: 11 .  spironolactone (ALDACTONE) 25 MG tablet, Take 1 tablet (25 mg total) by mouth at bedtime. Please call for office visit [phone], Disp: 30 tablet, Rfl: 5 No Known Allergies    Social History   Socioeconomic History  . Marital status: Single    Spouse name: Not on file  .  Number of children: Not on file  . Years of education: Not on file  . Highest education level: Not on file  Occupational History  . Not on file  Tobacco Use  . Smoking status: Current Every Day Smoker    Packs/day: 0.50    Types: Cigarettes  . Smokeless tobacco: Never Used  Substance and Sexual Activity  . Alcohol use: Yes    Alcohol/week: 2.0 standard drinks    Types: 2 Cans of beer per week    Comment: 2 40oz beer daily  . Drug use: Yes    Types: Marijuana  . Sexual activity: Not on file  Other Topics Concern  . Not on file  Social History Narrative  . Not on file   Social Determinants of Health   Financial Resource Strain:   . Difficulty of Paying Living Expenses:   Food Insecurity:   . Worried About Programme researcher, broadcasting/film/video in the Last Year:   . Barista in the Last Year:   Transportation Needs:   . Freight forwarder (Medical):   Marland Kitchen Lack of Transportation (Non-Medical):   Physical Activity:   . Days of Exercise per Week:   . Minutes of Exercise per Session:   Stress:   . Feeling of Stress :   Social Connections:   . Frequency of Communication with Friends and  Family:   . Frequency of Social Gatherings with Friends and Family:   . Attends Religious Services:   . Active Member of Clubs or Organizations:   . Attends Archivist Meetings:   Marland Kitchen Marital Status:   Intimate Partner Violence:   . Fear of Current or Ex-Partner:   . Emotionally Abused:   Marland Kitchen Physically Abused:   . Sexually Abused:     Physical Exam Cardiovascular:     Rate and Rhythm: Normal rate and regular rhythm.     Pulses: Normal pulses.  Pulmonary:     Effort: Pulmonary effort is normal.     Breath sounds: Normal breath sounds.  Musculoskeletal:        General: Normal range of motion.     Right lower leg: No edema.     Left lower leg: No edema.  Skin:    General: Skin is warm and dry.     Capillary Refill: Capillary refill takes less than 2 seconds.  Neurological:      Mental Status: He is alert and oriented to person, place, and time.  Psychiatric:        Mood and Affect: Mood normal.         No future appointments.  BP 130/60 (BP Location: Left Arm, Patient Position: Sitting, Cuff Size: Normal)   Pulse 64   Resp 16   Wt 131 lb (59.4 kg)   SpO2 97%   BMI 21.14 kg/m   Weight yesterday- 130.4 lb Last visit weight- 137.2 lb  Jeremy Powers was seen at home today and reported feeling well. He denied chest pain, SOB, headache, dizziness, orthopnea, fever or cough since our last visit. He reported being compliant with his medications over the past week and his weight has been stable. His medications were verified and his pillbox was refilled. I will follow up next week.  [name], EMT 08/12/19  ACTION: Home visit completed Next visit planned for 1 week

## 2019-08-19 ENCOUNTER — Other Ambulatory Visit (HOSPITAL_COMMUNITY): Payer: Self-pay

## 2019-08-19 NOTE — Progress Notes (Signed)
Paramedicine Encounter    Patient ID: Jeremy Powers, male    DOB: 1955/12/06, 64 y.o.   MRN: [id number]   Patient Care Team: [facility] as PCP - General (Pediatrics)  Patient Active Problem List   Diagnosis Date Noted  . Acute systolic (congestive) heart failure (HCC) 06/24/2018  . Aortic insufficiency 06/22/2018  . Acute on chronic congestive heart failure (HCC) 06/22/2018  . Elevated troponin 06/22/2018  . CAD (coronary artery disease) 06/22/2018  . Left bundle branch block 06/22/2018  . History of iron deficiency anemia 06/22/2018  . Acute on chronic heart failure (HCC) 06/22/2018    Current Outpatient Medications:  .  albuterol (VENTOLIN HFA) 108 (90 Base) MCG/ACT inhaler, Inhale 2 puffs into the lungs every 6 (six) hours as needed for wheezing or shortness of breath., Disp: 18 g, Rfl: 1 .  aspirin EC 81 MG tablet, Take 81 mg by mouth daily., Disp: , Rfl:  .  atorvastatin (LIPITOR) 40 MG tablet, TAKE 1 TABLET BY MOUTH DAILY AT 6 PM, Disp: 30 tablet, Rfl: 5 .  carvedilol (COREG) 6.25 MG tablet, Take 1 tablet (6.25 mg total) by mouth 2 (two) times daily., Disp: 180 tablet, Rfl: 1 .  digoxin (LANOXIN) 0.125 MG tablet, Take 1 tablet by mouth daily, Disp: 90 tablet, Rfl: 2 .  ferrous sulfate 325 (65 FE) MG tablet, Take 1 tablet (325 mg total) by mouth 2 (two) times daily with a meal., Disp: 180 tablet, Rfl: 3 .  furosemide (LASIX) 40 MG tablet, Take 1 tablet (40 mg total) by mouth daily as needed. Take 40 mg (1 tab) as needed for swelling., Disp: 30 tablet, Rfl: 3 .  sacubitril-valsartan (ENTRESTO) 49-51 MG, Take 1 tablet by mouth 2 (two) times daily., Disp: 60 tablet, Rfl: 11 .  spironolactone (ALDACTONE) 25 MG tablet, Take 1 tablet (25 mg total) by mouth at bedtime. Please call for office visit [phone], Disp: 30 tablet, Rfl: 5 No Known Allergies    Social History   Socioeconomic History  . Marital status: Single    Spouse name: Not on file  .  Number of children: Not on file  . Years of education: Not on file  . Highest education level: Not on file  Occupational History  . Not on file  Tobacco Use  . Smoking status: Current Every Day Smoker    Packs/day: 0.50    Types: Cigarettes  . Smokeless tobacco: Never Used  Substance and Sexual Activity  . Alcohol use: Yes    Alcohol/week: 2.0 standard drinks    Types: 2 Cans of beer per week    Comment: 2 40oz beer daily  . Drug use: Yes    Types: Marijuana  . Sexual activity: Not on file  Other Topics Concern  . Not on file  Social History Narrative  . Not on file   Social Determinants of Health   Financial Resource Strain:   . Difficulty of Paying Living Expenses:   Food Insecurity:   . Worried About Programme researcher, broadcasting/film/video in the Last Year:   . Barista in the Last Year:   Transportation Needs:   . Freight forwarder (Medical):   Marland Kitchen Lack of Transportation (Non-Medical):   Physical Activity:   . Days of Exercise per Week:   . Minutes of Exercise per Session:   Stress:   . Feeling of Stress :   Social Connections:   . Frequency of Communication with Friends and  Family:   . Frequency of Social Gatherings with Friends and Family:   . Attends Religious Services:   . Active Member of Clubs or Organizations:   . Attends Banker Meetings:   Marland Kitchen Marital Status:   Intimate Partner Violence:   . Fear of Current or Ex-Partner:   . Emotionally Abused:   Marland Kitchen Physically Abused:   . Sexually Abused:     Physical Exam Cardiovascular:     Rate and Rhythm: Normal rate and regular rhythm.     Pulses: Normal pulses.  Pulmonary:     Effort: Pulmonary effort is normal.     Breath sounds: Normal breath sounds.  Musculoskeletal:        General: Normal range of motion.     Right lower leg: No edema.     Left lower leg: No edema.  Skin:    General: Skin is warm and dry.     Capillary Refill: Capillary refill takes less than 2 seconds.  Neurological:      Mental Status: He is alert and oriented to person, place, and time.  Psychiatric:        Mood and Affect: Mood normal.         No future appointments.  BP (!) 116/59 (BP Location: Left Arm, Patient Position: Sitting, Cuff Size: Normal)   Pulse 60   Resp 16   Wt 133 lb 3.2 oz (60.4 kg)   SpO2 99%   BMI 21.50 kg/m   Weight yesterday- did not weigh Last visit weight- 131 lb  Mr Vogelsang was seen at home today and reported feeling well. He denied chest pain, SOB, headache, dizziness, orthopnea, fever or cough over the past week. He stated he has been compliant with his medications over the past week and his weight has been stable. His medications were verified and his pillbox was refilled. I will follow up next week.   Jacqualine Code, EMT 08/19/19  ACTION: Home visit completed Next visit planned for 1 week

## 2019-08-26 ENCOUNTER — Other Ambulatory Visit (HOSPITAL_COMMUNITY): Payer: Self-pay

## 2019-08-26 NOTE — Progress Notes (Signed)
Paramedicine Encounter    Patient ID: Jeremy Powers, male    DOB: 1955/12/06, 64 y.o.   MRN: [id number]   Patient Care Team: [facility] as PCP - General (Pediatrics)  Patient Active Problem List   Diagnosis Date Noted  . Acute systolic (congestive) heart failure (HCC) 06/24/2018  . Aortic insufficiency 06/22/2018  . Acute on chronic congestive heart failure (HCC) 06/22/2018  . Elevated troponin 06/22/2018  . CAD (coronary artery disease) 06/22/2018  . Left bundle branch block 06/22/2018  . History of iron deficiency anemia 06/22/2018  . Acute on chronic heart failure (HCC) 06/22/2018    Current Outpatient Medications:  .  albuterol (VENTOLIN HFA) 108 (90 Base) MCG/ACT inhaler, Inhale 2 puffs into the lungs every 6 (six) hours as needed for wheezing or shortness of breath., Disp: 18 g, Rfl: 1 .  aspirin EC 81 MG tablet, Take 81 mg by mouth daily., Disp: , Rfl:  .  atorvastatin (LIPITOR) 40 MG tablet, TAKE 1 TABLET BY MOUTH DAILY AT 6 PM, Disp: 30 tablet, Rfl: 5 .  carvedilol (COREG) 6.25 MG tablet, Take 1 tablet (6.25 mg total) by mouth 2 (two) times daily., Disp: 180 tablet, Rfl: 1 .  digoxin (LANOXIN) 0.125 MG tablet, Take 1 tablet by mouth daily, Disp: 90 tablet, Rfl: 2 .  ferrous sulfate 325 (65 FE) MG tablet, Take 1 tablet (325 mg total) by mouth 2 (two) times daily with a meal., Disp: 180 tablet, Rfl: 3 .  furosemide (LASIX) 40 MG tablet, Take 1 tablet (40 mg total) by mouth daily as needed. Take 40 mg (1 tab) as needed for swelling., Disp: 30 tablet, Rfl: 3 .  sacubitril-valsartan (ENTRESTO) 49-51 MG, Take 1 tablet by mouth 2 (two) times daily., Disp: 60 tablet, Rfl: 11 .  spironolactone (ALDACTONE) 25 MG tablet, Take 1 tablet (25 mg total) by mouth at bedtime. Please call for office visit [phone], Disp: 30 tablet, Rfl: 5 No Known Allergies    Social History   Socioeconomic History  . Marital status: Single    Spouse name: Not on file  .  Number of children: Not on file  . Years of education: Not on file  . Highest education level: Not on file  Occupational History  . Not on file  Tobacco Use  . Smoking status: Current Every Day Smoker    Packs/day: 0.50    Types: Cigarettes  . Smokeless tobacco: Never Used  Substance and Sexual Activity  . Alcohol use: Yes    Alcohol/week: 2.0 standard drinks    Types: 2 Cans of beer per week    Comment: 2 40oz beer daily  . Drug use: Yes    Types: Marijuana  . Sexual activity: Not on file  Other Topics Concern  . Not on file  Social History Narrative  . Not on file   Social Determinants of Health   Financial Resource Strain:   . Difficulty of Paying Living Expenses:   Food Insecurity:   . Worried About Programme researcher, broadcasting/film/video in the Last Year:   . Barista in the Last Year:   Transportation Needs:   . Freight forwarder (Medical):   Marland Kitchen Lack of Transportation (Non-Medical):   Physical Activity:   . Days of Exercise per Week:   . Minutes of Exercise per Session:   Stress:   . Feeling of Stress :   Social Connections:   . Frequency of Communication with Friends and  Family:   . Frequency of Social Gatherings with Friends and Family:   . Attends Religious Services:   . Active Member of Clubs or Organizations:   . Attends Banker Meetings:   Marland Kitchen Marital Status:   Intimate Partner Violence:   . Fear of Current or Ex-Partner:   . Emotionally Abused:   Marland Kitchen Physically Abused:   . Sexually Abused:     Physical Exam Cardiovascular:     Rate and Rhythm: Normal rate and regular rhythm.     Pulses: Normal pulses.  Pulmonary:     Effort: Pulmonary effort is normal.     Breath sounds: Normal breath sounds.  Abdominal:     General: Abdomen is flat. There is no distension.  Musculoskeletal:        General: Normal range of motion.     Right lower leg: No edema.     Left lower leg: No edema.  Skin:    General: Skin is warm and dry.     Capillary Refill:  Capillary refill takes less than 2 seconds.  Neurological:     Mental Status: He is alert and oriented to person, place, and time.  Psychiatric:        Mood and Affect: Mood normal.         No future appointments.  BP (!) 135/56 (BP Location: Right Arm, Patient Position: Sitting, Cuff Size: Normal)   Pulse 62   Resp 16   Wt 134 lb 9.6 oz (61.1 kg)   SpO2 98%   BMI 21.73 kg/m   Weight yesterday- 133.8 lb Last visit weight- 133 lb  Mr Bloom was seen at home today and reported feeling well. He denied chest pain, SOB, headache, dizziness, orthopnea, fever or cough over the past week. He remains compliant with his medications and his weight has been stable. His medications were verified and his pillbox was refilled. I will follow up next week.   Jacqualine Code, EMT 08/26/19  ACTION: Home visit completed Next visit planned for 1 week

## 2019-09-01 ENCOUNTER — Telehealth (HOSPITAL_COMMUNITY): Payer: Self-pay

## 2019-09-01 NOTE — Telephone Encounter (Signed)
I called Mr Cohen to schedule an appointment for tomorrow. He stated he would be available all day so I could stop by anytime. I advised I would be there close to 13:00 and he was agreeable.   Jacqualine Code, EMT 09/01/19

## 2019-09-02 ENCOUNTER — Other Ambulatory Visit (HOSPITAL_COMMUNITY): Payer: Self-pay

## 2019-09-02 NOTE — Progress Notes (Signed)
Paramedicine Encounter    Patient ID: Jeremy Powers, male    DOB: 1955/12/06, 64 y.o.   MRN: [id number]   Patient Care Team: [facility] as PCP - General (Pediatrics)  Patient Active Problem List   Diagnosis Date Noted  . Acute systolic (congestive) heart failure (HCC) 06/24/2018  . Aortic insufficiency 06/22/2018  . Acute on chronic congestive heart failure (HCC) 06/22/2018  . Elevated troponin 06/22/2018  . CAD (coronary artery disease) 06/22/2018  . Left bundle branch block 06/22/2018  . History of iron deficiency anemia 06/22/2018  . Acute on chronic heart failure (HCC) 06/22/2018    Current Outpatient Medications:  .  albuterol (VENTOLIN HFA) 108 (90 Base) MCG/ACT inhaler, Inhale 2 puffs into the lungs every 6 (six) hours as needed for wheezing or shortness of breath., Disp: 18 g, Rfl: 1 .  aspirin EC 81 MG tablet, Take 81 mg by mouth daily., Disp: , Rfl:  .  atorvastatin (LIPITOR) 40 MG tablet, TAKE 1 TABLET BY MOUTH DAILY AT 6 PM, Disp: 30 tablet, Rfl: 5 .  carvedilol (COREG) 6.25 MG tablet, Take 1 tablet (6.25 mg total) by mouth 2 (two) times daily., Disp: 180 tablet, Rfl: 1 .  digoxin (LANOXIN) 0.125 MG tablet, Take 1 tablet by mouth daily, Disp: 90 tablet, Rfl: 2 .  ferrous sulfate 325 (65 FE) MG tablet, Take 1 tablet (325 mg total) by mouth 2 (two) times daily with a meal., Disp: 180 tablet, Rfl: 3 .  furosemide (LASIX) 40 MG tablet, Take 1 tablet (40 mg total) by mouth daily as needed. Take 40 mg (1 tab) as needed for swelling., Disp: 30 tablet, Rfl: 3 .  sacubitril-valsartan (ENTRESTO) 49-51 MG, Take 1 tablet by mouth 2 (two) times daily., Disp: 60 tablet, Rfl: 11 .  spironolactone (ALDACTONE) 25 MG tablet, Take 1 tablet (25 mg total) by mouth at bedtime. Please call for office visit [phone], Disp: 30 tablet, Rfl: 5 No Known Allergies    Social History   Socioeconomic History  . Marital status: Single    Spouse name: Not on file  .  Number of children: Not on file  . Years of education: Not on file  . Highest education level: Not on file  Occupational History  . Not on file  Tobacco Use  . Smoking status: Current Every Day Smoker    Packs/day: 0.50    Types: Cigarettes  . Smokeless tobacco: Never Used  Substance and Sexual Activity  . Alcohol use: Yes    Alcohol/week: 2.0 standard drinks    Types: 2 Cans of beer per week    Comment: 2 40oz beer daily  . Drug use: Yes    Types: Marijuana  . Sexual activity: Not on file  Other Topics Concern  . Not on file  Social History Narrative  . Not on file   Social Determinants of Health   Financial Resource Strain:   . Difficulty of Paying Living Expenses:   Food Insecurity:   . Worried About Programme researcher, broadcasting/film/video in the Last Year:   . Barista in the Last Year:   Transportation Needs:   . Freight forwarder (Medical):   Marland Kitchen Lack of Transportation (Non-Medical):   Physical Activity:   . Days of Exercise per Week:   . Minutes of Exercise per Session:   Stress:   . Feeling of Stress :   Social Connections:   . Frequency of Communication with Friends and  Family:   . Frequency of Social Gatherings with Friends and Family:   . Attends Religious Services:   . Active Member of Clubs or Organizations:   . Attends Archivist Meetings:   Marland Kitchen Marital Status:   Intimate Partner Violence:   . Fear of Current or Ex-Partner:   . Emotionally Abused:   Marland Kitchen Physically Abused:   . Sexually Abused:     Physical Exam Cardiovascular:     Rate and Rhythm: Normal rate and regular rhythm.     Pulses: Normal pulses.  Pulmonary:     Effort: Pulmonary effort is normal.     Breath sounds: Normal breath sounds.  Musculoskeletal:        General: Normal range of motion.     Right lower leg: No edema.     Left lower leg: No edema.  Skin:    General: Skin is warm and dry.     Capillary Refill: Capillary refill takes less than 2 seconds.  Neurological:      Mental Status: He is alert and oriented to person, place, and time.  Psychiatric:        Mood and Affect: Mood normal.         No future appointments.  There were no vitals taken for this visit.  Weight yesterday- did not weigh Last visit weight- 134 lb  Jeremy Powers was seen at home today and rpeorted feeling well. He denied chest pain, SOB, headache, dizziness, orthopnea, fever or cough since our last visit. He stated he has been compliant with is medications over the past week and his weight has been stable. His medications were verified and his pillbox was refilled. I will follow up next week.   [name], EMT 09/02/19  ACTION: Home visit completed Next visit planned for 1 week

## 2019-09-09 ENCOUNTER — Telehealth (HOSPITAL_COMMUNITY): Payer: Self-pay | Admitting: Licensed Clinical Social Worker

## 2019-09-09 ENCOUNTER — Other Ambulatory Visit (HOSPITAL_COMMUNITY): Payer: Self-pay

## 2019-09-09 NOTE — Progress Notes (Signed)
Paramedicine Encounter    Patient ID: Jeremy Powers, male    DOB: 09-22-1955, 64 y.o.   MRN: [id number]   Patient Care Team: [name] as PCP - General (Internal Medicine)  Patient Active Problem List   Diagnosis Date Noted  . Acute systolic (congestive) heart failure (HCC) 06/24/2018  . Aortic insufficiency 06/22/2018  . Acute on chronic congestive heart failure (HCC) 06/22/2018  . Elevated troponin 06/22/2018  . CAD (coronary artery disease) 06/22/2018  . Left bundle branch block 06/22/2018  . History of iron deficiency anemia 06/22/2018  . Acute on chronic heart failure (HCC) 06/22/2018    Current Outpatient Medications:  .  albuterol (VENTOLIN HFA) 108 (90 Base) MCG/ACT inhaler, Inhale 2 puffs into the lungs every 6 (six) hours as needed for wheezing or shortness of breath., Disp: 18 g, Rfl: 1 .  aspirin EC 81 MG tablet, Take 81 mg by mouth daily., Disp: , Rfl:  .  atorvastatin (LIPITOR) 40 MG tablet, TAKE 1 TABLET BY MOUTH DAILY AT 6 PM, Disp: 30 tablet, Rfl: 5 .  carvedilol (COREG) 6.25 MG tablet, Take 1 tablet (6.25 mg total) by mouth 2 (two) times daily., Disp: 180 tablet, Rfl: 1 .  digoxin (LANOXIN) 0.125 MG tablet, Take 1 tablet by mouth daily, Disp: 90 tablet, Rfl: 2 .  ferrous sulfate 325 (65 FE) MG tablet, Take 1 tablet (325 mg total) by mouth 2 (two) times daily with a meal., Disp: 180 tablet, Rfl: 3 .  furosemide (LASIX) 40 MG tablet, Take 1 tablet (40 mg total) by mouth daily as needed. Take 40 mg (1 tab) as needed for swelling., Disp: 30 tablet, Rfl: 3 .  sacubitril-valsartan (ENTRESTO) 49-51 MG, Take 1 tablet by mouth 2 (two) times daily., Disp: 60 tablet, Rfl: 11 .  spironolactone (ALDACTONE) 25 MG tablet, Take 1 tablet (25 mg total) by mouth at bedtime. Please call for office visit [phone], Disp: 30 tablet, Rfl: 5 No Known Allergies    Social History   Socioeconomic History  . Marital status: Single    Spouse name: Not on file  . Number of  children: Not on file  . Years of education: Not on file  . Highest education level: Not on file  Occupational History  . Not on file  Tobacco Use  . Smoking status: Current Every Day Smoker    Packs/day: 0.50    Types: Cigarettes  . Smokeless tobacco: Never Used  Substance and Sexual Activity  . Alcohol use: Yes    Alcohol/week: 2.0 standard drinks    Types: 2 Cans of beer per week    Comment: 2 40oz beer daily  . Drug use: Yes    Types: Marijuana  . Sexual activity: Not on file  Other Topics Concern  . Not on file  Social History Narrative  . Not on file   Social Determinants of Health   Financial Resource Strain:   . Difficulty of Paying Living Expenses:   Food Insecurity:   . Worried About Programme researcher, broadcasting/film/video in the Last Year:   . Barista in the Last Year:   Transportation Needs:   . Freight forwarder (Medical):   Marland Kitchen Lack of Transportation (Non-Medical):   Physical Activity:   . Days of Exercise per Week:   . Minutes of Exercise per Session:   Stress:   . Feeling of Stress :   Social Connections:   . Frequency of Communication with Friends and Family:   .  Frequency of Social Gatherings with Friends and Family:   . Attends Religious Services:   . Active Member of Clubs or Organizations:   . Attends Archivist Meetings:   Marland Kitchen Marital Status:   Intimate Partner Violence:   . Fear of Current or Ex-Partner:   . Emotionally Abused:   Marland Kitchen Physically Abused:   . Sexually Abused:     Physical Exam Cardiovascular:     Rate and Rhythm: Normal rate and regular rhythm.     Pulses: Normal pulses.  Pulmonary:     Effort: Pulmonary effort is normal.     Breath sounds: Normal breath sounds.  Abdominal:     General: Abdomen is flat.  Musculoskeletal:        General: Normal range of motion.     Right lower leg: No edema.     Left lower leg: No edema.  Skin:    General: Skin is warm and dry.     Capillary Refill: Capillary refill takes less than 2  seconds.  Neurological:     Mental Status: He is alert and oriented to person, place, and time.         Future Appointments  Date Time Provider [place]  09/30/2019  2:00 PM Saguier, Percell Miller, PA-C [facility]    BP (!) 141/73 (BP Location: Left Arm, Patient Position: Sitting, Cuff Size: Normal)   Pulse 76   Resp 16   Wt 124 lb 11.2 oz (56.6 kg)   SpO2 98%   BMI 20.13 kg/m   Weight yesterday- did not weigh Last visit weight- 127 lb  Jeremy Powers was seen at home today and reported feeling well. He denied chest pain, SOB, headache, dizziness, orthopnea, fever or cough over the past week. He stated she has been compliant with her medications and her weight has been stable. His medications were verified and her pillbox was refilled. He has a swollen spot just proximal to his left knee which has been causing his some discomfort. I contacted Tammy Sours to see if she could get him set up with a PCP regarding this. She was able to schedule him for an appointment on May 12 at 14:00. Jeremy Powers was made aware and stated he would be there. I advised that if the area swells more or if he begins having more pain or difficulty ambulating before this appointment he should seek care at an emergency room or urgent care. He was understanding and agreeable. I will follow up next week.   [name], EMT 09/09/19  ACTION: Home visit completed Next visit planned for 1 week

## 2019-09-09 NOTE — Telephone Encounter (Signed)
Community Paramedic called to request assistance in getting pt a PCP.  CSW able to get an establish PCP appt for pt with [facility] for May 12th at 2pm- pt informed of appt  CSW will continue to follow and assist as needed  Burna Sis, LCSW Clinical Social Worker [facility] Desk#: [phone] Cell#: [phone]

## 2019-09-14 ENCOUNTER — Other Ambulatory Visit (HOSPITAL_COMMUNITY): Payer: Self-pay

## 2019-09-14 MED ORDER — ATORVASTATIN CALCIUM 40 MG PO TABS: ORAL_TABLET | ORAL | 5 refills | Status: DC

## 2019-09-16 ENCOUNTER — Other Ambulatory Visit: Payer: Self-pay

## 2019-09-16 ENCOUNTER — Other Ambulatory Visit (HOSPITAL_COMMUNITY): Payer: Self-pay

## 2019-09-16 ENCOUNTER — Emergency Department (HOSPITAL_BASED_OUTPATIENT_CLINIC_OR_DEPARTMENT_OTHER)
Admission: EM | Admit: 2019-09-16 | Discharge: 2019-09-16 | Disposition: A | Discharge status: Home / Self Care | Payer: Medicare Other | Attending: Emergency Medicine | Admitting: Emergency Medicine

## 2019-09-16 ENCOUNTER — Emergency Department (HOSPITAL_BASED_OUTPATIENT_CLINIC_OR_DEPARTMENT_OTHER): Payer: Medicare Other

## 2019-09-16 ENCOUNTER — Other Ambulatory Visit (HOSPITAL_COMMUNITY): Payer: Self-pay | Admitting: *Deleted

## 2019-09-16 ENCOUNTER — Encounter (HOSPITAL_BASED_OUTPATIENT_CLINIC_OR_DEPARTMENT_OTHER): Payer: Self-pay

## 2019-09-16 DIAGNOSIS — Z87891 Personal history of nicotine dependence: Secondary | ICD-10-CM | POA: Insufficient documentation

## 2019-09-16 DIAGNOSIS — Z955 Presence of coronary angioplasty implant and graft: Secondary | ICD-10-CM | POA: Insufficient documentation

## 2019-09-16 DIAGNOSIS — I2510 Atherosclerotic heart disease of native coronary artery without angina pectoris: Secondary | ICD-10-CM | POA: Diagnosis not present

## 2019-09-16 DIAGNOSIS — W19XXXA Unspecified fall, initial encounter: Secondary | ICD-10-CM | POA: Diagnosis not present

## 2019-09-16 DIAGNOSIS — Y939 Activity, unspecified: Secondary | ICD-10-CM | POA: Insufficient documentation

## 2019-09-16 DIAGNOSIS — Z79899 Other long term (current) drug therapy: Secondary | ICD-10-CM | POA: Diagnosis not present

## 2019-09-16 DIAGNOSIS — I5033 Acute on chronic diastolic (congestive) heart failure: Secondary | ICD-10-CM | POA: Insufficient documentation

## 2019-09-16 DIAGNOSIS — Y929 Unspecified place or not applicable: Secondary | ICD-10-CM | POA: Insufficient documentation

## 2019-09-16 DIAGNOSIS — I10 Essential (primary) hypertension: Secondary | ICD-10-CM | POA: Insufficient documentation

## 2019-09-16 DIAGNOSIS — Y999 Unspecified external cause status: Secondary | ICD-10-CM | POA: Diagnosis not present

## 2019-09-16 DIAGNOSIS — M25562 Pain in left knee: Secondary | ICD-10-CM | POA: Diagnosis not present

## 2019-09-16 HISTORY — DX: Unspecified cirrhosis of liver: K74.60

## 2019-09-16 HISTORY — DX: Acute pancreatitis without necrosis or infection, unspecified: K85.90

## 2019-09-16 MED ORDER — ATORVASTATIN CALCIUM 40 MG PO TABS: ORAL_TABLET | ORAL | 5 refills | Status: DC

## 2019-09-16 MED ORDER — NAPROXEN 500 MG PO TABS: 500.0000 mg | ORAL_TABLET | Freq: Two times a day (BID) | ORAL | 0 refills | Status: AC

## 2019-09-16 MED ORDER — NAPROXEN 250 MG PO TABS
500.0000 mg | ORAL_TABLET | Freq: Once | ORAL | Status: AC
  Administered 2019-09-16: 15:00:00 500 mg via ORAL
  Filled 2019-09-16: qty 2

## 2019-09-16 NOTE — Discharge Instructions (Addendum)
Your x-ray was within normal limits today.  I have provided a prescription for anti-inflammatories, please take 1 tablet twice a day for the next 7 days.  This medication may be taken with food to avoid an upset stomach.  The number to the [facility] health and wellness clinic is attached to your chart, please schedule an appointment in order to establish primary care.

## 2019-09-16 NOTE — ED Provider Notes (Signed)
[facility] EMERGENCY DEPARTMENT Provider Note   CSN: [id number] Arrival date & time: 09/16/19  1339     History Chief Complaint  Patient presents with  . Knee Pain    Jeremy Powers is a 64 y.o. male.  64 y.o male with a PMH of CAD, HTN, Cirrhosis presents to the ED with complaints of left knee pain status post fall.  According to patient he had "may be a fall "about a few months ago, does report pain to his left knee, does report the pain is exacerbated with standing especially in the mornings.  Reports he is unable to put much weight as he feels like "my knee is giving out ".  He has tried some anti-inflammatories which is given in the past, there was mild improvement in symptoms then.  Does not have any prior history of gout, no fevers, other complaints.  The history is provided by the patient.  Knee Pain Associated symptoms: no fever        Past Medical History:  Diagnosis Date  . Cirrhosis (HCC)   . Coronary artery disease   . Hypertension   . Pancreatitis     Patient Active Problem List   Diagnosis Date Noted  . Acute systolic (congestive) heart failure (HCC) 06/24/2018  . Aortic insufficiency 06/22/2018  . Acute on chronic congestive heart failure (HCC) 06/22/2018  . Elevated troponin 06/22/2018  . CAD (coronary artery disease) 06/22/2018  . Left bundle branch block 06/22/2018  . History of iron deficiency anemia 06/22/2018  . Acute on chronic heart failure (HCC) 06/22/2018    Past Surgical History:  Procedure Laterality Date  . CARDIAC SURGERY    . RIGHT/LEFT HEART CATH AND CORONARY/GRAFT ANGIOGRAPHY N/A 06/24/2018   Procedure: RIGHT/LEFT HEART CATH AND CORONARY/GRAFT ANGIOGRAPHY;  Surgeon: Dolores Patty, MD;  Location: [facility] INVASIVE CV LAB;  Service: Cardiovascular;  Laterality: N/A;       No family history on file.  Social History   Tobacco Use  . Smoking status: Former Smoker    Packs/day: 0.50    Types: Cigarettes  . Smokeless  tobacco: Never Used  Substance Use Topics  . Alcohol use: Yes    Comment: daily  . Drug use: Yes    Types: Marijuana    Home Medications Prior to Admission medications   Medication Sig Start Date End Date Taking? Authorizing Provider  albuterol (VENTOLIN HFA) 108 (90 Base) MCG/ACT inhaler Inhale 2 puffs into the lungs every 6 (six) hours as needed for wheezing or shortness of breath. 12/25/18   Clegg, Amy D, NP  aspirin EC 81 MG tablet Take 81 mg by mouth daily.    [provider]  atorvastatin (LIPITOR) 40 MG tablet TAKE 1 TABLET BY MOUTH DAILY AT 6 PM 09/16/19   Bensimhon, Bevelyn Buckles, MD  carvedilol (COREG) 6.25 MG tablet Take 1 tablet (6.25 mg total) by mouth 2 (two) times daily. 06/10/19   Tonye Becket D, NP  digoxin (LANOXIN) 0.125 MG tablet Take 1 tablet by mouth daily 08/07/19   Bensimhon, Bevelyn Buckles, MD  ferrous sulfate 325 (65 FE) MG tablet Take 1 tablet (325 mg total) by mouth 2 (two) times daily with a meal. 03/05/19   Laurey Morale, MD  furosemide (LASIX) 40 MG tablet Take 1 tablet (40 mg total) by mouth daily as needed. Take 40 mg (1 tab) as needed for swelling. 03/05/19 09/09/19  Bensimhon, Bevelyn Buckles, MD  sacubitril-valsartan (ENTRESTO) 49-51 MG Take 1 tablet by  mouth 2 (two) times daily. 03/23/19   Bensimhon, Bevelyn Buckles, MD  spironolactone (ALDACTONE) 25 MG tablet Take 1 tablet (25 mg total) by mouth at bedtime. Please call for office visit [phone] 08/07/19   Laurey Morale, MD    Allergies    Patient has no known allergies.  Review of Systems   Review of Systems  Constitutional: Negative for fever.  Musculoskeletal: Positive for arthralgias.    Physical Exam Updated Vital Signs BP 129/71 (BP Location: Right Arm)   Pulse (!) 59   Temp 98.1 F (36.7 C) (Oral)   Resp 16   SpO2 100%   Physical Exam Vitals and nursing note reviewed.  HENT:     Head: Normocephalic and atraumatic.     Nose: Nose normal.     Mouth/Throat:     Mouth: Mucous membranes are moist.    Cardiovascular:     Rate and Rhythm: Normal rate.  Pulmonary:     Effort: Pulmonary effort is normal.  Abdominal:     General: Abdomen is flat.  Musculoskeletal:        General: Tenderness present. No swelling, deformity or signs of injury.     Cervical back: Normal range of motion and neck supple.     Left knee: Bony tenderness present. No swelling, deformity, effusion, erythema, ecchymosis or lacerations. Normal range of motion. Tenderness present over the lateral joint line.     Right lower leg: No edema.     Left lower leg: No edema.     Comments: Tenderness to palpation along the lateral joint line, no swelling, erythema, effusion noted.  Full range of motion.  Pulses present, capillary refill intact, no skin changes. No calf tenderness or pitting edema.   Skin:    General: Skin is warm and dry.  Neurological:     Mental Status: He is oriented to person, place, and time.     ED Results / Procedures / Treatments   Labs (all labs ordered are listed, but only abnormal results are displayed) Labs Reviewed - No data to display  EKG None  Radiology DG Knee Complete 4 Views Left  Result Date: 09/16/2019 CLINICAL DATA:  Persistent left knee pain and swelling since fall week ago. EXAM: LEFT KNEE - COMPLETE 4+ VIEW COMPARISON:  None. FINDINGS: No evidence of fracture, dislocation, or joint effusion. No evidence of arthropathy or other focal bone abnormality. Soft tissues are unremarkable. Vascular calcifications. IMPRESSION: Negative. Electronically Signed   By: Obie Dredge M.D.   On: 09/16/2019 14:41    Procedures Procedures (including critical care time)  Medications Ordered in ED Medications  naproxen (NAPROSYN) tablet 500 mg (has no administration in time range)    ED Course  I have reviewed the triage vital signs and the nursing notes.  Pertinent labs & imaging results that were available during my care of the patient were reviewed by me and considered in my medical  decision making (see chart for details).    MDM Rules/Calculators/A&P     The past medical history of CAD presents to the ED with complaints of left knee pain.  He reports pain along the lateral aspect of his left knee, there is no bruising, swelling, erythema, decreased range of motion noted.  He is ambulatory in the ED with a steady gait.  He reports that last week he felt his left knee give out, no prior history of gout, fevers, lower suspicion for septic joint.  No pitting edema, no calf tenderness, no  prior history of blood clots lower suspicion for DVT.  X-ray of his left knee without any acute process such as fracture, dislocation, fusion.  We discussed this at length.  He was advised that he will go home on a short course of anti-inflammatories, patient does have prior medical history, advised to establish primary care via [facility] wellness clinic.  He is agreeable with plan, he also received a knee brace to help with swelling.  Return precautions discussed at length.  Patient stable for discharge.  Portions of this note were generated with Lobbyist. Dictation errors may occur despite best attempts at proofreading.  Final Clinical Impression(s) / ED Diagnoses Final diagnoses:  Fall, initial encounter  Acute pain of left knee    Rx / DC Orders ED Discharge Orders    None       Janeece Fitting, PA-C 09/23/19 1644    Dorie Rank, MD 09/25/19 [phone]

## 2019-09-16 NOTE — ED Triage Notes (Addendum)
Pt c/o pain/swelling to left knee-to triage in w/c-NAD-nephew reports pt with hx of pain/swelling to left knee-also left knee "gave out last week" and he fell onto knee

## 2019-09-16 NOTE — Progress Notes (Signed)
Paramedicine Encounter    Patient ID: Jeremy Powers, male    DOB: [birth year], 64 y.o.   MRN: [id number]   Patient Care Team: [name] as PCP - General (Internal Medicine)  Patient Active Problem List   Diagnosis Date Noted  . Acute systolic (congestive) heart failure (HCC) 06/24/2018  . Aortic insufficiency 06/22/2018  . Acute on chronic congestive heart failure (HCC) 06/22/2018  . Elevated troponin 06/22/2018  . CAD (coronary artery disease) 06/22/2018  . Left bundle branch block 06/22/2018  . History of iron deficiency anemia 06/22/2018  . Acute on chronic heart failure (HCC) 06/22/2018    Current Outpatient Medications:  .  albuterol (VENTOLIN HFA) 108 (90 Base) MCG/ACT inhaler, Inhale 2 puffs into the lungs every 6 (six) hours as needed for wheezing or shortness of breath., Disp: 18 g, Rfl: 1 .  aspirin EC 81 MG tablet, Take 81 mg by mouth daily., Disp: , Rfl:  .  atorvastatin (LIPITOR) 40 MG tablet, TAKE 1 TABLET BY MOUTH DAILY AT 6 PM, Disp: 30 tablet, Rfl: 5 .  carvedilol (COREG) 6.25 MG tablet, Take 1 tablet (6.25 mg total) by mouth 2 (two) times daily., Disp: 180 tablet, Rfl: 1 .  digoxin (LANOXIN) 0.125 MG tablet, Take 1 tablet by mouth daily, Disp: 90 tablet, Rfl: 2 .  ferrous sulfate 325 (65 FE) MG tablet, Take 1 tablet (325 mg total) by mouth 2 (two) times daily with a meal., Disp: 180 tablet, Rfl: 3 .  furosemide (LASIX) 40 MG tablet, Take 1 tablet (40 mg total) by mouth daily as needed. Take 40 mg (1 tab) as needed for swelling., Disp: 30 tablet, Rfl: 3 .  sacubitril-valsartan (ENTRESTO) 49-51 MG, Take 1 tablet by mouth 2 (two) times daily., Disp: 60 tablet, Rfl: 11 .  spironolactone (ALDACTONE) 25 MG tablet, Take 1 tablet (25 mg total) by mouth at bedtime. Please call for office visit [phone], Disp: 30 tablet, Rfl: 5 No Known Allergies    Social History   Socioeconomic History  . Marital status: Single    Spouse name: Not on file  . Number of  children: Not on file  . Years of education: Not on file  . Highest education level: Not on file  Occupational History  . Not on file  Tobacco Use  . Smoking status: Current Every Day Smoker    Packs/day: 0.50    Types: Cigarettes  . Smokeless tobacco: Never Used  Substance and Sexual Activity  . Alcohol use: Yes    Alcohol/week: 2.0 standard drinks    Types: 2 Cans of beer per week    Comment: 2 40oz beer daily  . Drug use: Yes    Types: Marijuana  . Sexual activity: Not on file  Other Topics Concern  . Not on file  Social History Narrative  . Not on file   Social Determinants of Health   Financial Resource Strain:   . Difficulty of Paying Living Expenses:   Food Insecurity:   . Worried About Programme researcher, broadcasting/film/video in the Last Year:   . Barista in the Last Year:   Transportation Needs:   . Freight forwarder (Medical):   Marland Kitchen Lack of Transportation (Non-Medical):   Physical Activity:   . Days of Exercise per Week:   . Minutes of Exercise per Session:   Stress:   . Feeling of Stress :   Social Connections:   . Frequency of Communication with Friends and Family:   .  Frequency of Social Gatherings with Friends and Family:   . Attends Religious Services:   . Active Member of Clubs or Organizations:   . Attends Archivist Meetings:   Marland Kitchen Marital Status:   Intimate Partner Violence:   . Fear of Current or Ex-Partner:   . Emotionally Abused:   Marland Kitchen Physically Abused:   . Sexually Abused:     Physical Exam Cardiovascular:     Rate and Rhythm: Regular rhythm. Bradycardia present.     Pulses: Normal pulses.  Pulmonary:     Effort: Pulmonary effort is normal.     Breath sounds: Normal breath sounds.  Abdominal:     General: Abdomen is flat. There is no distension.  Musculoskeletal:        General: Normal range of motion.     Right lower leg: No edema.     Left lower leg: No edema.  Skin:    General: Skin is warm and dry.     Capillary Refill:  Capillary refill takes less than 2 seconds.  Neurological:     Mental Status: He is alert and oriented to person, place, and time.  Psychiatric:        Mood and Affect: Mood normal.         Future Appointments  Date Time Provider [place]  09/30/2019  2:00 PM Saguier, Percell Miller, PA-C [facility]    BP (!) 118/52 (BP Location: Left Arm, Patient Position: Sitting, Cuff Size: Normal)   Pulse (!) 56   Resp 16   Wt 124 lb 6.4 oz (56.4 kg)   SpO2 97%   BMI 20.08 kg/m   Weight yesterday- 125.4 lb Last visit weight- 124 lb  Jeremy Powers was seen at home today and reported feeling generally well. He denied chest pain, SOB, headache, orthopnea, fever or cough since our last visit. He stated his knee is continuing to give him discomfort and is limiting the amount of exercise he is able to get in a day's time. He also reported intermittent dizziness but stated it resolves when he rests and it primarily come on when he is walking to the dumpster (a little over 100 yards away from his apartment). His medications were verified and his pillbox was refilled. He expressed increased concern over the pain in his left knee so I advised if he is unable to wait for his scheduled PCP appointment he could be seen in the ED. His nephew on scene became concerned and was insistent that Jeremy Powers get his leg evaluated today. Jeremy Powers was agreeable. I will follow up later in the week to make any additions to his pillbox the may come from this visit to the ED.   [name], EMT 09/16/19  ACTION: Home visit completed Next visit planned for later this week.

## 2019-09-23 ENCOUNTER — Other Ambulatory Visit (HOSPITAL_COMMUNITY): Payer: Self-pay

## 2019-09-23 NOTE — Progress Notes (Signed)
Paramedicine Encounter    Patient ID: Jeremy Powers, male    DOB: 04-12-1956, 64 y.o.   MRN: [id number]   Patient Care Team: Elise Benne as PCP - General (Internal Medicine)  Patient Active Problem List   Diagnosis Date Noted  . Acute systolic (congestive) heart failure ([place]) 06/24/2018  . Aortic insufficiency 06/22/2018  . Acute on chronic congestive heart failure ([place]) 06/22/2018  . Elevated troponin 06/22/2018  . CAD (coronary artery disease) 06/22/2018  . Left bundle branch block 06/22/2018  . History of iron deficiency anemia 06/22/2018  . Acute on chronic heart failure ([place]) 06/22/2018    Current Outpatient Medications:  .  albuterol (VENTOLIN HFA) 108 (90 Base) MCG/ACT inhaler, Inhale 2 puffs into the lungs every 6 (six) hours as needed for wheezing or shortness of breath., Disp: 18 g, Rfl: 1 .  aspirin EC 81 MG tablet, Take 81 mg by mouth daily., Disp: , Rfl:  .  atorvastatin (LIPITOR) 40 MG tablet, TAKE 1 TABLET BY MOUTH DAILY AT 6 PM, Disp: 30 tablet, Rfl: 5 .  carvedilol (COREG) 6.25 MG tablet, Take 1 tablet (6.25 mg total) by mouth 2 (two) times daily., Disp: 180 tablet, Rfl: 1 .  digoxin (LANOXIN) 0.125 MG tablet, Take 1 tablet by mouth daily, Disp: 90 tablet, Rfl: 2 .  ferrous sulfate 325 (65 FE) MG tablet, Take 1 tablet (325 mg total) by mouth 2 (two) times daily with a meal., Disp: 180 tablet, Rfl: 3 .  furosemide (LASIX) 40 MG tablet, Take 1 tablet (40 mg total) by mouth daily as needed. Take 40 mg (1 tab) as needed for swelling., Disp: 30 tablet, Rfl: 3 .  naproxen (NAPROSYN) 500 MG tablet, Take 1 tablet (500 mg total) by mouth 2 (two) times daily for 7 days., Disp: 14 tablet, Rfl: 0 .  sacubitril-valsartan (ENTRESTO) 49-51 MG, Take 1 tablet by mouth 2 (two) times daily., Disp: 60 tablet, Rfl: 11 .  spironolactone (ALDACTONE) 25 MG tablet, Take 1 tablet (25 mg total) by mouth at bedtime. Please call for office visit [phone], Disp: 30 tablet, Rfl: 5 No  Known Allergies    Social History   Socioeconomic History  . Marital status: Single    Spouse name: Not on file  . Number of children: Not on file  . Years of education: Not on file  . Highest education level: Not on file  Occupational History  . Not on file  Tobacco Use  . Smoking status: Former Smoker    Packs/day: 0.50    Types: Cigarettes  . Smokeless tobacco: Never Used  Substance and Sexual Activity  . Alcohol use: Yes    Comment: daily  . Drug use: Yes    Types: Marijuana  . Sexual activity: Not on file  Other Topics Concern  . Not on file  Social History Narrative  . Not on file   Social Determinants of Health   Financial Resource Strain:   . Difficulty of Paying Living Expenses:   Food Insecurity:   . Worried About Charity fundraiser in the Last Year:   . Arboriculturist in the Last Year:   Transportation Needs:   . Film/video editor (Medical):   Marland Kitchen Lack of Transportation (Non-Medical):   Physical Activity:   . Days of Exercise per Week:   . Minutes of Exercise per Session:   Stress:   . Feeling of Stress :   Social Connections:   . Frequency of Communication  with Friends and Family:   . Frequency of Social Gatherings with Friends and Family:   . Attends Religious Services:   . Active Member of Clubs or Organizations:   . Attends Banker Meetings:   Marland Kitchen Marital Status:   Intimate Partner Violence:   . Fear of Current or Ex-Partner:   . Emotionally Abused:   Marland Kitchen Physically Abused:   . Sexually Abused:     Physical Exam Cardiovascular:     Rate and Rhythm: Normal rate and regular rhythm.     Pulses: Normal pulses.  Pulmonary:     Effort: Pulmonary effort is normal.     Breath sounds: Normal breath sounds.  Musculoskeletal:        General: Normal range of motion.     Right lower leg: No edema.     Left lower leg: No edema.  Skin:    General: Skin is warm and dry.     Capillary Refill: Capillary refill takes less than 2  seconds.  Neurological:     Mental Status: He is alert and oriented to person, place, and time.  Psychiatric:        Mood and Affect: Mood normal.         Future Appointments  Date Time Provider Department Center  09/30/2019  2:00 PM Saguier, Ramon Dredge, PA-C [facility]    BP 140/60 (BP Location: Left Arm, Patient Position: Sitting, Cuff Size: Normal)   Pulse 70   Resp 16   Wt 125 lb 12.8 oz (57.1 kg)   SpO2 98%   BMI 20.30 kg/m   Weight yesterday- did not weigh Last visit weight- 124.4 lb  Mr Gentile was seen at home today and reported feeling well. He denied chest pain, SOB, headache, dizziness, orthopnea, cough or fever since our lats visit. He stated he has been compliant with his medications over the past week and his weight has been stable. His medications were verified and his pillbox was refilled. I will follow up next week.   Jacqualine Code, EMT 09/23/19  ACTION: Home visit completed Next visit planned for 1 week

## 2019-09-30 ENCOUNTER — Other Ambulatory Visit (HOSPITAL_COMMUNITY): Payer: Self-pay

## 2019-09-30 ENCOUNTER — Ambulatory Visit: Payer: Medicare Other | Admitting: Medical

## 2019-09-30 NOTE — Progress Notes (Signed)
Paramedicine Encounter    Patient ID: Jeremy Powers, male    DOB: Apr 13, 1956, 64 y.o.   MRN: [id number]   Patient Care Team: [name] as PCP - General (Internal Medicine)  Patient Active Problem List   Diagnosis Date Noted  . Acute systolic (congestive) heart failure (HCC) 06/24/2018  . Aortic insufficiency 06/22/2018  . Acute on chronic congestive heart failure (HCC) 06/22/2018  . Elevated troponin 06/22/2018  . CAD (coronary artery disease) 06/22/2018  . Left bundle branch block 06/22/2018  . History of iron deficiency anemia 06/22/2018  . Acute on chronic heart failure (HCC) 06/22/2018    Current Outpatient Medications:  .  albuterol (VENTOLIN HFA) 108 (90 Base) MCG/ACT inhaler, Inhale 2 puffs into the lungs every 6 (six) hours as needed for wheezing or shortness of breath., Disp: 18 g, Rfl: 1 .  aspirin EC 81 MG tablet, Take 81 mg by mouth daily., Disp: , Rfl:  .  atorvastatin (LIPITOR) 40 MG tablet, TAKE 1 TABLET BY MOUTH DAILY AT 6 PM, Disp: 30 tablet, Rfl: 5 .  carvedilol (COREG) 6.25 MG tablet, Take 1 tablet (6.25 mg total) by mouth 2 (two) times daily., Disp: 180 tablet, Rfl: 1 .  digoxin (LANOXIN) 0.125 MG tablet, Take 1 tablet by mouth daily, Disp: 90 tablet, Rfl: 2 .  ferrous sulfate 325 (65 FE) MG tablet, Take 1 tablet (325 mg total) by mouth 2 (two) times daily with a meal., Disp: 180 tablet, Rfl: 3 .  furosemide (LASIX) 40 MG tablet, Take 1 tablet (40 mg total) by mouth daily as needed. Take 40 mg (1 tab) as needed for swelling., Disp: 30 tablet, Rfl: 3 .  sacubitril-valsartan (ENTRESTO) 49-51 MG, Take 1 tablet by mouth 2 (two) times daily., Disp: 60 tablet, Rfl: 11 .  spironolactone (ALDACTONE) 25 MG tablet, Take 1 tablet (25 mg total) by mouth at bedtime. Please call for office visit [phone], Disp: 30 tablet, Rfl: 5 No Known Allergies    Social History   Socioeconomic History  . Marital status: Single    Spouse name: Not on file  . Number of  children: Not on file  . Years of education: Not on file  . Highest education level: Not on file  Occupational History  . Not on file  Tobacco Use  . Smoking status: Former Smoker    Packs/day: 0.50    Types: Cigarettes  . Smokeless tobacco: Never Used  Substance and Sexual Activity  . Alcohol use: Yes    Comment: daily  . Drug use: Yes    Types: Marijuana  . Sexual activity: Not on file  Other Topics Concern  . Not on file  Social History Narrative  . Not on file   Social Determinants of Health   Financial Resource Strain:   . Difficulty of Paying Living Expenses:   Food Insecurity:   . Worried About Programme researcher, broadcasting/film/video in the Last Year:   . Barista in the Last Year:   Transportation Needs:   . Freight forwarder (Medical):   Marland Kitchen Lack of Transportation (Non-Medical):   Physical Activity:   . Days of Exercise per Week:   . Minutes of Exercise per Session:   Stress:   . Feeling of Stress :   Social Connections:   . Frequency of Communication with Friends and Family:   . Frequency of Social Gatherings with Friends and Family:   . Attends Religious Services:   . Active Member of  Clubs or Organizations:   . Attends Banker Meetings:   Marland Kitchen Marital Status:   Intimate Partner Violence:   . Fear of Current or Ex-Partner:   . Emotionally Abused:   Marland Kitchen Physically Abused:   . Sexually Abused:     Physical Exam Cardiovascular:     Rate and Rhythm: Normal rate and regular rhythm.     Pulses: Normal pulses.  Pulmonary:     Effort: Pulmonary effort is normal.     Breath sounds: Normal breath sounds.  Musculoskeletal:        General: Normal range of motion.     Right lower leg: No edema.     Left lower leg: No edema.  Skin:    General: Skin is warm and dry.     Capillary Refill: Capillary refill takes less than 2 seconds.  Neurological:     Mental Status: He is alert and oriented to person, place, and time.  Psychiatric:        Mood and Affect:  Mood normal.         Future Appointments  Date Time Provider Department Center  09/30/2019  2:00 PM Saguier, Ramon Dredge, PA-C [id number]    BP 118/60 (BP Location: Left Arm, Patient Position: Sitting, Cuff Size: Normal)   Pulse 61   Resp 16   Wt 126 lb (57.2 kg)   SpO2 97%   BMI 20.34 kg/m   Weight yesterday- 122.6 lb Last visit weight- 126.8 lb  Jeremy Powers was seen at home today and reported feeling well. He denied chest pain, SOB, headache, dizziness, orthopnea, fever or cough over the past week. He stated he has been compliant with his medications over the past week and his weight has been stable. His medications were verified and his pillbox was refilled I will follow up next week.   Jacqualine Code, EMT 09/30/19  ACTION: Home visit completed Next visit planned for 1 week

## 2019-10-07 ENCOUNTER — Other Ambulatory Visit (HOSPITAL_COMMUNITY): Payer: Self-pay

## 2019-10-07 NOTE — Progress Notes (Signed)
Paramedicine Encounter    Patient ID: Jeremy Powers, male    DOB: Apr 13, 1956, 64 y.o.   MRN: [id number]   Patient Care Team: Jeremy Powers as PCP - General (Internal Medicine)  Patient Active Problem List   Diagnosis Date Noted  . Acute systolic (congestive) heart failure (HCC) 06/24/2018  . Aortic insufficiency 06/22/2018  . Acute on chronic congestive heart failure (HCC) 06/22/2018  . Elevated troponin 06/22/2018  . CAD (coronary artery disease) 06/22/2018  . Left bundle branch block 06/22/2018  . History of iron deficiency anemia 06/22/2018  . Acute on chronic heart failure (HCC) 06/22/2018    Current Outpatient Medications:  .  albuterol (VENTOLIN HFA) 108 (90 Base) MCG/ACT inhaler, Inhale 2 puffs into the lungs every 6 (six) hours as needed for wheezing or shortness of breath., Disp: 18 g, Rfl: 1 .  aspirin EC 81 MG tablet, Take 81 mg by mouth daily., Disp: , Rfl:  .  atorvastatin (LIPITOR) 40 MG tablet, TAKE 1 TABLET BY MOUTH DAILY AT 6 PM, Disp: 30 tablet, Rfl: 5 .  carvedilol (COREG) 6.25 MG tablet, Take 1 tablet (6.25 mg total) by mouth 2 (two) times daily., Disp: 180 tablet, Rfl: 1 .  digoxin (LANOXIN) 0.125 MG tablet, Take 1 tablet by mouth daily, Disp: 90 tablet, Rfl: 2 .  ferrous sulfate 325 (65 FE) MG tablet, Take 1 tablet (325 mg total) by mouth 2 (two) times daily with a meal., Disp: 180 tablet, Rfl: 3 .  furosemide (LASIX) 40 MG tablet, Take 1 tablet (40 mg total) by mouth daily as needed. Take 40 mg (1 tab) as needed for swelling., Disp: 30 tablet, Rfl: 3 .  sacubitril-valsartan (ENTRESTO) 49-51 MG, Take 1 tablet by mouth 2 (two) times daily., Disp: 60 tablet, Rfl: 11 .  spironolactone (ALDACTONE) 25 MG tablet, Take 1 tablet (25 mg total) by mouth at bedtime. Please call for office visit [phone], Disp: 30 tablet, Rfl: 5 No Known Allergies    Social History   Socioeconomic History  . Marital status: Single    Spouse name: Not on file  . Number of  children: Not on file  . Years of education: Not on file  . Highest education level: Not on file  Occupational History  . Not on file  Tobacco Use  . Smoking status: Former Smoker    Packs/day: 0.50    Types: Cigarettes  . Smokeless tobacco: Never Used  Substance and Sexual Activity  . Alcohol use: Yes    Comment: daily  . Drug use: Yes    Types: Marijuana  . Sexual activity: Not on file  Other Topics Concern  . Not on file  Social History Narrative  . Not on file   Social Determinants of Health   Financial Resource Strain:   . Difficulty of Paying Living Expenses:   Food Insecurity:   . Worried About Programme researcher, broadcasting/film/video in the Last Year:   . Barista in the Last Year:   Transportation Needs:   . Freight forwarder (Medical):   Marland Kitchen Lack of Transportation (Non-Medical):   Physical Activity:   . Days of Exercise per Week:   . Minutes of Exercise per Session:   Stress:   . Feeling of Stress :   Social Connections:   . Frequency of Communication with Friends and Family:   . Frequency of Social Gatherings with Friends and Family:   . Attends Religious Services:   . Active Member of  Clubs or Organizations:   . Attends Archivist Meetings:   Marland Kitchen Marital Status:   Intimate Partner Violence:   . Fear of Current or Ex-Partner:   . Emotionally Abused:   Marland Kitchen Physically Abused:   . Sexually Abused:     Physical Exam Cardiovascular:     Rate and Rhythm: Normal rate and regular rhythm.     Pulses: Normal pulses.  Pulmonary:     Effort: Pulmonary effort is normal.     Breath sounds: Normal breath sounds.  Musculoskeletal:        General: Normal range of motion.     Right lower leg: No edema.     Left lower leg: No edema.  Skin:    General: Skin is warm and dry.     Capillary Refill: Capillary refill takes less than 2 seconds.  Neurological:     Mental Status: He is alert and oriented to person, place, and time.  Psychiatric:        Mood and Affect:  Mood normal.         No future appointments.  BP (!) 100/52 (BP Location: Left Arm, Patient Position: Sitting, Cuff Size: Normal)   Pulse 65   Resp 16   Wt 125 lb (56.7 kg)   SpO2 100%   BMI 20.18 kg/m   Weight yesterday-125.4 lb Last visit weight- 126 lb  Jeremy Powers was seen at home today and reported feeling well. He denied chest pain, SOB, headache, dizziness, orthopnea, fever or cough since our last visit. He reported being compliant with his medications over the past week and his weight has been stable. We discussed transitioning him to pill packs today and he was receptive. I will begin to look for a pharmacy near his which may be able to facilitate this. His medications were verified and his pillbox was refilled. I will follow up next week.    Jeremy Powers, EMT 10/07/19  ACTION: Home visit completed Next visit planned for 1 week

## 2019-10-14 ENCOUNTER — Other Ambulatory Visit (HOSPITAL_COMMUNITY): Payer: Self-pay

## 2019-10-14 NOTE — Progress Notes (Signed)
Paramedicine Encounter    Patient ID: Jeremy Powers, male    DOB: Apr 13, 1956, 64 y.o.   MRN: [id number]   Patient Care Team: [name] as PCP - General (Internal Medicine)  Patient Active Problem List   Diagnosis Date Noted  . Acute systolic (congestive) heart failure (HCC) 06/24/2018  . Aortic insufficiency 06/22/2018  . Acute on chronic congestive heart failure (HCC) 06/22/2018  . Elevated troponin 06/22/2018  . CAD (coronary artery disease) 06/22/2018  . Left bundle branch block 06/22/2018  . History of iron deficiency anemia 06/22/2018  . Acute on chronic heart failure (HCC) 06/22/2018    Current Outpatient Medications:  .  albuterol (VENTOLIN HFA) 108 (90 Base) MCG/ACT inhaler, Inhale 2 puffs into the lungs every 6 (six) hours as needed for wheezing or shortness of breath., Disp: 18 g, Rfl: 1 .  aspirin EC 81 MG tablet, Take 81 mg by mouth daily., Disp: , Rfl:  .  atorvastatin (LIPITOR) 40 MG tablet, TAKE 1 TABLET BY MOUTH DAILY AT 6 PM, Disp: 30 tablet, Rfl: 5 .  carvedilol (COREG) 6.25 MG tablet, Take 1 tablet (6.25 mg total) by mouth 2 (two) times daily., Disp: 180 tablet, Rfl: 1 .  digoxin (LANOXIN) 0.125 MG tablet, Take 1 tablet by mouth daily, Disp: 90 tablet, Rfl: 2 .  ferrous sulfate 325 (65 FE) MG tablet, Take 1 tablet (325 mg total) by mouth 2 (two) times daily with a meal., Disp: 180 tablet, Rfl: 3 .  furosemide (LASIX) 40 MG tablet, Take 1 tablet (40 mg total) by mouth daily as needed. Take 40 mg (1 tab) as needed for swelling., Disp: 30 tablet, Rfl: 3 .  sacubitril-valsartan (ENTRESTO) 49-51 MG, Take 1 tablet by mouth 2 (two) times daily., Disp: 60 tablet, Rfl: 11 .  spironolactone (ALDACTONE) 25 MG tablet, Take 1 tablet (25 mg total) by mouth at bedtime. Please call for office visit [phone], Disp: 30 tablet, Rfl: 5 No Known Allergies    Social History   Socioeconomic History  . Marital status: Single    Spouse name: Not on file  . Number of  children: Not on file  . Years of education: Not on file  . Highest education level: Not on file  Occupational History  . Not on file  Tobacco Use  . Smoking status: Former Smoker    Packs/day: 0.50    Types: Cigarettes  . Smokeless tobacco: Never Used  Substance and Sexual Activity  . Alcohol use: Yes    Comment: daily  . Drug use: Yes    Types: Marijuana  . Sexual activity: Not on file  Other Topics Concern  . Not on file  Social History Narrative  . Not on file   Social Determinants of Health   Financial Resource Strain:   . Difficulty of Paying Living Expenses:   Food Insecurity:   . Worried About Programme researcher, broadcasting/film/video in the Last Year:   . Barista in the Last Year:   Transportation Needs:   . Freight forwarder (Medical):   Marland Kitchen Lack of Transportation (Non-Medical):   Physical Activity:   . Days of Exercise per Week:   . Minutes of Exercise per Session:   Stress:   . Feeling of Stress :   Social Connections:   . Frequency of Communication with Friends and Family:   . Frequency of Social Gatherings with Friends and Family:   . Attends Religious Services:   . Active Member of  Clubs or Organizations:   . Attends Banker Meetings:   Marland Kitchen Marital Status:   Intimate Partner Violence:   . Fear of Current or Ex-Partner:   . Emotionally Abused:   Marland Kitchen Physically Abused:   . Sexually Abused:     Physical Exam Cardiovascular:     Rate and Rhythm: Normal rate and regular rhythm.     Pulses: Normal pulses.  Pulmonary:     Effort: Pulmonary effort is normal.     Breath sounds: Normal breath sounds.  Abdominal:     General: Abdomen is flat.  Musculoskeletal:        General: Normal range of motion.     Right lower leg: No edema.     Left lower leg: No edema.  Skin:    General: Skin is warm and dry.     Capillary Refill: Capillary refill takes less than 2 seconds.  Neurological:     Mental Status: He is alert and oriented to person, place, and  time.  Psychiatric:        Mood and Affect: Mood normal.         No future appointments.  BP (!) 105/52 (BP Location: Left Arm, Patient Position: Sitting, Cuff Size: Normal)   Pulse 60   Wt 125 lb (56.7 kg)   SpO2 (!) 16%   PF 97 L/min   BMI 20.18 kg/m   Weight yesterday- 124 lb Last visit weight- 125 lb  [name] was seen at home today and reported feeling well. He denied chest pain, SOB, headache, dizziness, orthopnea, fever or cough since our last visit. He stated he has been compliant with his medications and his weight has been stable. His medications were verified and his pillbox was refilled. I will follow up next week.   Jacqualine Code, EMT 10/14/19  ACTION: Home visit completed Next visit planned for 1 week

## 2019-10-21 ENCOUNTER — Other Ambulatory Visit (HOSPITAL_COMMUNITY): Payer: Self-pay | Admitting: *Deleted

## 2019-10-21 ENCOUNTER — Other Ambulatory Visit (HOSPITAL_COMMUNITY): Payer: Self-pay

## 2019-10-21 MED ORDER — CARVEDILOL 6.25 MG PO TABS: 6.2500 mg | ORAL_TABLET | Freq: Two times a day (BID) | ORAL | 1 refills | Status: DC

## 2019-10-21 NOTE — Progress Notes (Signed)
Paramedicine Encounter    Patient ID: IVEY CINA, male    DOB: Apr 13, 1956, 64 y.o.   MRN: [id number]   Patient Care Team: [name] as PCP - General (Internal Medicine)  Patient Active Problem List   Diagnosis Date Noted  . Acute systolic (congestive) heart failure (HCC) 06/24/2018  . Aortic insufficiency 06/22/2018  . Acute on chronic congestive heart failure (HCC) 06/22/2018  . Elevated troponin 06/22/2018  . CAD (coronary artery disease) 06/22/2018  . Left bundle branch block 06/22/2018  . History of iron deficiency anemia 06/22/2018  . Acute on chronic heart failure (HCC) 06/22/2018    Current Outpatient Medications:  .  albuterol (VENTOLIN HFA) 108 (90 Base) MCG/ACT inhaler, Inhale 2 puffs into the lungs every 6 (six) hours as needed for wheezing or shortness of breath., Disp: 18 g, Rfl: 1 .  aspirin EC 81 MG tablet, Take 81 mg by mouth daily., Disp: , Rfl:  .  atorvastatin (LIPITOR) 40 MG tablet, TAKE 1 TABLET BY MOUTH DAILY AT 6 PM, Disp: 30 tablet, Rfl: 5 .  carvedilol (COREG) 6.25 MG tablet, Take 1 tablet (6.25 mg total) by mouth 2 (two) times daily., Disp: 180 tablet, Rfl: 1 .  digoxin (LANOXIN) 0.125 MG tablet, Take 1 tablet by mouth daily, Disp: 90 tablet, Rfl: 2 .  ferrous sulfate 325 (65 FE) MG tablet, Take 1 tablet (325 mg total) by mouth 2 (two) times daily with a meal., Disp: 180 tablet, Rfl: 3 .  furosemide (LASIX) 40 MG tablet, Take 1 tablet (40 mg total) by mouth daily as needed. Take 40 mg (1 tab) as needed for swelling., Disp: 30 tablet, Rfl: 3 .  sacubitril-valsartan (ENTRESTO) 49-51 MG, Take 1 tablet by mouth 2 (two) times daily., Disp: 60 tablet, Rfl: 11 .  spironolactone (ALDACTONE) 25 MG tablet, Take 1 tablet (25 mg total) by mouth at bedtime. Please call for office visit [phone], Disp: 30 tablet, Rfl: 5 No Known Allergies    Social History   Socioeconomic History  . Marital status: Single    Spouse name: Not on file  . Number of  children: Not on file  . Years of education: Not on file  . Highest education level: Not on file  Occupational History  . Not on file  Tobacco Use  . Smoking status: Former Smoker    Packs/day: 0.50    Types: Cigarettes  . Smokeless tobacco: Never Used  Substance and Sexual Activity  . Alcohol use: Yes    Comment: daily  . Drug use: Yes    Types: Marijuana  . Sexual activity: Not on file  Other Topics Concern  . Not on file  Social History Narrative  . Not on file   Social Determinants of Health   Financial Resource Strain:   . Difficulty of Paying Living Expenses:   Food Insecurity:   . Worried About Programme researcher, broadcasting/film/video in the Last Year:   . Barista in the Last Year:   Transportation Needs:   . Freight forwarder (Medical):   Marland Kitchen Lack of Transportation (Non-Medical):   Physical Activity:   . Days of Exercise per Week:   . Minutes of Exercise per Session:   Stress:   . Feeling of Stress :   Social Connections:   . Frequency of Communication with Friends and Family:   . Frequency of Social Gatherings with Friends and Family:   . Attends Religious Services:   . Active Member of  Clubs or Organizations:   . Attends Banker Meetings:   Marland Kitchen Marital Status:   Intimate Partner Violence:   . Fear of Current or Ex-Partner:   . Emotionally Abused:   Marland Kitchen Physically Abused:   . Sexually Abused:     Physical Exam Cardiovascular:     Rate and Rhythm: Normal rate and regular rhythm.     Pulses: Normal pulses.  Pulmonary:     Effort: Pulmonary effort is normal.     Breath sounds: Normal breath sounds.  Musculoskeletal:        General: Normal range of motion.     Right lower leg: No edema.     Left lower leg: No edema.  Skin:    General: Skin is warm and dry.     Capillary Refill: Capillary refill takes less than 2 seconds.  Neurological:     Mental Status: He is alert and oriented to person, place, and time.         No future  appointments.  BP 130/60 (BP Location: Left Arm, Patient Position: Sitting, Cuff Size: Normal)   Pulse 66   Resp 16   Wt 125 lb 6.4 oz (56.9 kg)   SpO2 99%   BMI 20.24 kg/m    Weight yesterday- did not weigh Last visit weight- 125 lb  Mr Russman was seen at home today and reported feeling well. He denied chest pain, SOB, headache, dizziness, orthopnea, fever or cough. He stated he has been compliant with his medications and his weight remains stable. His medications were verified and his pillbox was refilled. He only had enough medications to fill through Friday but today all necessary medications were ordered from a new pharmacy in bubble packs and will be delivered by tomorrow or Friday. I will follow up after those have been delivered.   Jacqualine Code, EMT 10/21/19  ACTION: Home visit completed Next visit planned for Friday

## 2019-10-28 ENCOUNTER — Other Ambulatory Visit (HOSPITAL_COMMUNITY): Payer: Self-pay

## 2019-10-28 NOTE — Progress Notes (Signed)
Paramedicine Encounter    Patient ID: Jeremy Powers, male    DOB: [birth year], 64 y.o.   MRN: [id number]   Patient Care Team: Mackie Pai, Hershal Coria as PCP - General (Internal Medicine)  Patient Active Problem List   Diagnosis Date Noted   Acute systolic (congestive) heart failure ([place]) 06/24/2018   Aortic insufficiency 06/22/2018   Acute on chronic congestive heart failure ([place]) 06/22/2018   Elevated troponin 06/22/2018   CAD (coronary artery disease) 06/22/2018   Left bundle branch block 06/22/2018   History of iron deficiency anemia 06/22/2018   Acute on chronic heart failure ([place]) 06/22/2018    Current Outpatient Medications:    albuterol (VENTOLIN HFA) 108 (90 Base) MCG/ACT inhaler, Inhale 2 puffs into the lungs every 6 (six) hours as needed for wheezing or shortness of breath., Disp: 18 g, Rfl: 1   aspirin EC 81 MG tablet, Take 81 mg by mouth daily., Disp: , Rfl:    atorvastatin (LIPITOR) 40 MG tablet, TAKE 1 TABLET BY MOUTH DAILY AT 6 PM, Disp: 30 tablet, Rfl: 5   carvedilol (COREG) 6.25 MG tablet, Take 1 tablet (6.25 mg total) by mouth 2 (two) times daily., Disp: 180 tablet, Rfl: 1   digoxin (LANOXIN) 0.125 MG tablet, Take 1 tablet by mouth daily, Disp: 90 tablet, Rfl: 2   ferrous sulfate 325 (65 FE) MG tablet, Take 1 tablet (325 mg total) by mouth 2 (two) times daily with a meal., Disp: 180 tablet, Rfl: 3   furosemide (LASIX) 40 MG tablet, Take 1 tablet (40 mg total) by mouth daily as needed. Take 40 mg (1 tab) as needed for swelling., Disp: 30 tablet, Rfl: 3   sacubitril-valsartan (ENTRESTO) 49-51 MG, Take 1 tablet by mouth 2 (two) times daily., Disp: 60 tablet, Rfl: 11   spironolactone (ALDACTONE) 25 MG tablet, Take 1 tablet (25 mg total) by mouth at bedtime. Please call for office visit [phone], Disp: 30 tablet, Rfl: 5 No Known Allergies    Social History   Socioeconomic History   Marital status: Single    Spouse name: Not on file   Number of  children: Not on file   Years of education: Not on file   Highest education level: Not on file  Occupational History   Not on file  Tobacco Use   Smoking status: Former Smoker    Packs/day: 0.50    Types: Cigarettes   Smokeless tobacco: Never Used  Substance and Sexual Activity   Alcohol use: Yes    Comment: daily   Drug use: Yes    Types: Marijuana   Sexual activity: Not on file  Other Topics Concern   Not on file  Social History Narrative   Not on file   Social Determinants of Health   Financial Resource Strain:    Difficulty of Paying Living Expenses:   Food Insecurity:    Worried About Charity fundraiser in the Last Year:    Arboriculturist in the Last Year:   Transportation Needs:    Film/video editor (Medical):    Lack of Transportation (Non-Medical):   Physical Activity:    Days of Exercise per Week:    Minutes of Exercise per Session:   Stress:    Feeling of Stress :   Social Connections:    Frequency of Communication with Friends and Family:    Frequency of Social Gatherings with Friends and Family:    Attends Religious Services:    Active Member of  Clubs or Organizations:    Attends Engineer, structural:    Marital Status:   Intimate Partner Violence:    Fear of Current or Ex-Partner:    Emotionally Abused:    Physically Abused:    Sexually Abused:     Physical Exam Cardiovascular:     Rate and Rhythm: Normal rate and regular rhythm.     Pulses: Normal pulses.  Pulmonary:     Effort: Pulmonary effort is normal.     Breath sounds: Normal breath sounds.  Musculoskeletal:        General: Normal range of motion.     Right lower leg: No edema.     Left lower leg: No edema.  Skin:    General: Skin is warm and dry.     Capillary Refill: Capillary refill takes less than 2 seconds.  Neurological:     Mental Status: He is alert and oriented to person, place, and time.  Psychiatric:        Mood and Affect:  Mood normal.         No future appointments.  BP 106/64 (BP Location: Left Arm, Patient Position: Sitting, Cuff Size: Normal)    Pulse 77    Resp 16    Wt 125 lb 4.8 oz (56.8 kg)    SpO2 100%    BMI 20.22 kg/m   Weight yesterday- 123.5 lb Last visit weight- 125.4 lb  Jeremy Powers was seen at home today and reported feeling well. He denied chest pain, SOB, headache, dizziness, orthopnea, fever or cough over the past week he stated he has been compliant with his medications and his weight has been stable. His medications had been delivered in blister packs so we spent significant time going over them and how he should take them. I believe he understands but I also told his girlfriend how he should use the packs and she was understanding. I will follow up next week.   Jacqualine Code, EMT 10/28/19  ACTION: Home visit completed Next visit planned for 1 week

## 2019-11-04 ENCOUNTER — Other Ambulatory Visit (HOSPITAL_COMMUNITY): Payer: Self-pay

## 2019-11-04 NOTE — Progress Notes (Signed)
Paramedicine Encounter    Patient ID: Jeremy Powers, male    DOB: 1956/03/02, 64 y.o.   MRN: [id number]   Patient Care Team: Elise Benne as PCP - General (Internal Medicine)  Patient Active Problem List   Diagnosis Date Noted  . Acute systolic (congestive) heart failure ([place]) 06/24/2018  . Aortic insufficiency 06/22/2018  . Acute on chronic congestive heart failure ([place]) 06/22/2018  . Elevated troponin 06/22/2018  . CAD (coronary artery disease) 06/22/2018  . Left bundle branch block 06/22/2018  . History of iron deficiency anemia 06/22/2018  . Acute on chronic heart failure ([place]) 06/22/2018    Current Outpatient Medications:  .  albuterol (VENTOLIN HFA) 108 (90 Base) MCG/ACT inhaler, Inhale 2 puffs into the lungs every 6 (six) hours as needed for wheezing or shortness of breath., Disp: 18 g, Rfl: 1 .  aspirin EC 81 MG tablet, Take 81 mg by mouth daily., Disp: , Rfl:  .  atorvastatin (LIPITOR) 40 MG tablet, TAKE 1 TABLET BY MOUTH DAILY AT 6 PM, Disp: 30 tablet, Rfl: 5 .  carvedilol (COREG) 6.25 MG tablet, Take 1 tablet (6.25 mg total) by mouth 2 (two) times daily., Disp: 180 tablet, Rfl: 1 .  digoxin (LANOXIN) 0.125 MG tablet, Take 1 tablet by mouth daily, Disp: 90 tablet, Rfl: 2 .  ferrous sulfate 325 (65 FE) MG tablet, Take 1 tablet (325 mg total) by mouth 2 (two) times daily with a meal., Disp: 180 tablet, Rfl: 3 .  furosemide (LASIX) 40 MG tablet, Take 1 tablet (40 mg total) by mouth daily as needed. Take 40 mg (1 tab) as needed for swelling., Disp: 30 tablet, Rfl: 3 .  sacubitril-valsartan (ENTRESTO) 49-51 MG, Take 1 tablet by mouth 2 (two) times daily., Disp: 60 tablet, Rfl: 11 .  spironolactone (ALDACTONE) 25 MG tablet, Take 1 tablet (25 mg total) by mouth at bedtime. Please call for office visit [phone], Disp: 30 tablet, Rfl: 5 No Known Allergies    Social History   Socioeconomic History  . Marital status: Single    Spouse name: Not on file  . Number of  children: Not on file  . Years of education: Not on file  . Highest education level: Not on file  Occupational History  . Not on file  Tobacco Use  . Smoking status: Former Smoker    Packs/day: 0.50    Types: Cigarettes  . Smokeless tobacco: Never Used  Vaping Use  . Vaping Use: Never used  Substance and Sexual Activity  . Alcohol use: Yes    Comment: daily  . Drug use: Yes    Types: Marijuana  . Sexual activity: Not on file  Other Topics Concern  . Not on file  Social History Narrative  . Not on file   Social Determinants of Health   Financial Resource Strain:   . Difficulty of Paying Living Expenses:   Food Insecurity:   . Worried About Charity fundraiser in the Last Year:   . Arboriculturist in the Last Year:   Transportation Needs:   . Film/video editor (Medical):   Marland Kitchen Lack of Transportation (Non-Medical):   Physical Activity:   . Days of Exercise per Week:   . Minutes of Exercise per Session:   Stress:   . Feeling of Stress :   Social Connections:   . Frequency of Communication with Friends and Family:   . Frequency of Social Gatherings with Friends and Family:   .  Attends Religious Services:   . Active Member of Clubs or Organizations:   . Attends Banker Meetings:   Marland Kitchen Marital Status:   Intimate Partner Violence:   . Fear of Current or Ex-Partner:   . Emotionally Abused:   Marland Kitchen Physically Abused:   . Sexually Abused:     Physical Exam Cardiovascular:     Rate and Rhythm: Normal rate and regular rhythm.     Pulses: Normal pulses.  Pulmonary:     Effort: Pulmonary effort is normal.     Breath sounds: Normal breath sounds.  Abdominal:     General: There is no distension.  Musculoskeletal:        General: Normal range of motion.     Right lower leg: No edema.     Left lower leg: No edema.  Skin:    General: Skin is warm.     Capillary Refill: Capillary refill takes less than 2 seconds.  Neurological:     Mental Status: He is alert  and oriented to person, place, and time.  Psychiatric:        Mood and Affect: Mood normal.         No future appointments.  BP (!) 95/50 (BP Location: Left Arm, Patient Position: Sitting, Cuff Size: Normal)   Pulse 60   Resp 16   Wt 124 lb (56.2 kg)   SpO2 98%   BMI 20.01 kg/m   Weight yesterday- 120 lb Last visit weight- 125 lb  Jeremy Powers was seen at home today and reported feeling well. He denied chest pain, SOB, headache, orthopnea, fever or cough over the past week but did complain of some dizziness this morning prior to my arrival. He was found to be slightly hypotensive but all other vital signs were within normal limits. He has been compliant with his medications and his weight was down yesterday but back to baseline today. All things considered I believe he was slightly dehydrated so I advised his to increase his water intake slightly. He was understanding and agreeable. He has been using pill packs which seem to be working well for him. I will follow up next week.   Jacqualine Code, EMT 11/04/19  ACTION: Home visit completed Next visit planned for 1 week

## 2019-11-11 ENCOUNTER — Other Ambulatory Visit (HOSPITAL_COMMUNITY): Payer: Self-pay

## 2019-11-11 NOTE — Progress Notes (Signed)
Paramedicine Encounter    Patient ID: Jeremy Powers, male    DOB: 07-18-1955, 64 y.o.   MRN: [id number]   Patient Care Team: [name], Cordelia Poche as PCP - General (Internal Medicine)  Patient Active Problem List   Diagnosis Date Noted   Acute systolic (congestive) heart failure ([facility]) 06/24/2018   Aortic insufficiency 06/22/2018   Acute on chronic congestive heart failure ([facility]) 06/22/2018   Elevated troponin 06/22/2018   CAD (coronary artery disease) 06/22/2018   Left bundle branch block 06/22/2018   History of iron deficiency anemia 06/22/2018   Acute on chronic heart failure ([facility]) 06/22/2018    Current Outpatient Medications:    albuterol (VENTOLIN HFA) 108 (90 Base) MCG/ACT inhaler, Inhale 2 puffs into the lungs every 6 (six) hours as needed for wheezing or shortness of breath., Disp: 18 g, Rfl: 1   aspirin EC 81 MG tablet, Take 81 mg by mouth daily., Disp: , Rfl:    atorvastatin (LIPITOR) 40 MG tablet, TAKE 1 TABLET BY MOUTH DAILY AT 6 PM, Disp: 30 tablet, Rfl: 5   carvedilol (COREG) 6.25 MG tablet, Take 1 tablet (6.25 mg total) by mouth 2 (two) times daily., Disp: 180 tablet, Rfl: 1   digoxin (LANOXIN) 0.125 MG tablet, Take 1 tablet by mouth daily, Disp: 90 tablet, Rfl: 2   ferrous sulfate 325 (65 FE) MG tablet, Take 1 tablet (325 mg total) by mouth 2 (two) times daily with a meal., Disp: 180 tablet, Rfl: 3   furosemide (LASIX) 40 MG tablet, Take 1 tablet (40 mg total) by mouth daily as needed. Take 40 mg (1 tab) as needed for swelling., Disp: 30 tablet, Rfl: 3   sacubitril-valsartan (ENTRESTO) 49-51 MG, Take 1 tablet by mouth 2 (two) times daily., Disp: 60 tablet, Rfl: 11   spironolactone (ALDACTONE) 25 MG tablet, Take 1 tablet (25 mg total) by mouth at bedtime. Please call for office visit [phone], Disp: 30 tablet, Rfl: 5 No Known Allergies    Social History   Socioeconomic History   Marital status: Single    Spouse name: Not on file   Number of  children: Not on file   Years of education: Not on file   Highest education level: Not on file  Occupational History   Not on file  Tobacco Use   Smoking status: Former Smoker    Packs/day: 0.50    Types: Cigarettes   Smokeless tobacco: Never Used  Vaping Use   Vaping Use: Never used  Substance and Sexual Activity   Alcohol use: Yes    Comment: daily   Drug use: Yes    Types: Marijuana   Sexual activity: Not on file  Other Topics Concern   Not on file  Social History Narrative   Not on file   Social Determinants of Health   Financial Resource Strain:    Difficulty of Paying Living Expenses:   Food Insecurity:    Worried About Programme researcher, broadcasting/film/video in the Last Year:    Barista in the Last Year:   Transportation Needs:    Freight forwarder (Medical):    Lack of Transportation (Non-Medical):   Physical Activity:    Days of Exercise per Week:    Minutes of Exercise per Session:   Stress:    Feeling of Stress :   Social Connections:    Frequency of Communication with Friends and Family:    Frequency of Social Gatherings with Friends and Family:  Attends Religious Services:    Active Member of Clubs or Organizations:    Attends Banker Meetings:    Marital Status:   Intimate Partner Violence:    Fear of Current or Ex-Partner:    Emotionally Abused:    Physically Abused:    Sexually Abused:     Physical Exam Cardiovascular:     Rate and Rhythm: Regular rhythm. Bradycardia present.     Pulses: Normal pulses.  Pulmonary:     Effort: Pulmonary effort is normal.     Breath sounds: Normal breath sounds.  Musculoskeletal:        General: Normal range of motion.     Right lower leg: No edema.     Left lower leg: No edema.  Skin:    General: Skin is warm and dry.     Capillary Refill: Capillary refill takes less than 2 seconds.  Neurological:     Mental Status: He is alert and oriented to person, place, and  time.  Psychiatric:        Mood and Affect: Mood normal.         No future appointments.  BP (!) 116/54 (BP Location: Left Arm, Patient Position: Sitting, Cuff Size: Normal)    Pulse (!) 58    Resp 16    Wt 123 lb 3.2 oz (55.9 kg)    SpO2 100%    BMI 19.89 kg/m   Weight yesterday- did not weigh Last visit weight- 124 lb  Jeremy Powers was seen at home today and reported feeling well. He denied chest pain, SOB, headache, dizziness, orthopnea, fever or cough since our last week. He stated he has been compliant with his medications over the past week and his weight has been stable. Upon looking at his blister packed medications I noted that he has missed two mornings and 4 evenings over the past week. Additionally he missed the atorvastatin and aspirin in his pillbox. I brought this up to him and he stated he would be sure to taken everything over the next week. I will follow up next Wednesday.   Jacqualine Code, EMT 11/11/19  ACTION: Home visit completed Next visit planned for 1 week

## 2019-11-17 ENCOUNTER — Telehealth (HOSPITAL_COMMUNITY): Payer: Self-pay

## 2019-11-17 NOTE — Telephone Encounter (Signed)
I called Jeremy Powers to schedule an appointment for tomorrow. He stated he would be home all day so I advised I would be there around 11:30. He was understanding and agreeable.   Jacqualine Code, EMT 11/17/19

## 2019-11-18 ENCOUNTER — Other Ambulatory Visit (HOSPITAL_COMMUNITY): Payer: Self-pay

## 2019-11-18 NOTE — Progress Notes (Signed)
Paramedicine Encounter    Patient ID: Jeremy Powers, male    DOB: October 21, 1955, 64 y.o.   MRN: [id number]   Patient Care Team: [name], Cordelia Poche as PCP - General (Internal Medicine)  Patient Active Problem List   Diagnosis Date Noted   Acute systolic (congestive) heart failure ([facility]) 06/24/2018   Aortic insufficiency 06/22/2018   Acute on chronic congestive heart failure ([facility]) 06/22/2018   Elevated troponin 06/22/2018   CAD (coronary artery disease) 06/22/2018   Left bundle branch block 06/22/2018   History of iron deficiency anemia 06/22/2018   Acute on chronic heart failure ([facility]) 06/22/2018    Current Outpatient Medications:    albuterol (VENTOLIN HFA) 108 (90 Base) MCG/ACT inhaler, Inhale 2 puffs into the lungs every 6 (six) hours as needed for wheezing or shortness of breath., Disp: 18 g, Rfl: 1   aspirin EC 81 MG tablet, Take 81 mg by mouth daily., Disp: , Rfl:    atorvastatin (LIPITOR) 40 MG tablet, TAKE 1 TABLET BY MOUTH DAILY AT 6 PM, Disp: 30 tablet, Rfl: 5   carvedilol (COREG) 6.25 MG tablet, Take 1 tablet (6.25 mg total) by mouth 2 (two) times daily., Disp: 180 tablet, Rfl: 1   digoxin (LANOXIN) 0.125 MG tablet, Take 1 tablet by mouth daily, Disp: 90 tablet, Rfl: 2   ferrous sulfate 325 (65 FE) MG tablet, Take 1 tablet (325 mg total) by mouth 2 (two) times daily with a meal., Disp: 180 tablet, Rfl: 3   furosemide (LASIX) 40 MG tablet, Take 1 tablet (40 mg total) by mouth daily as needed. Take 40 mg (1 tab) as needed for swelling., Disp: 30 tablet, Rfl: 3   sacubitril-valsartan (ENTRESTO) 49-51 MG, Take 1 tablet by mouth 2 (two) times daily., Disp: 60 tablet, Rfl: 11   spironolactone (ALDACTONE) 25 MG tablet, Take 1 tablet (25 mg total) by mouth at bedtime. Please call for office visit [phone], Disp: 30 tablet, Rfl: 5 No Known Allergies    Social History   Socioeconomic History   Marital status: Single    Spouse name: Not on file   Number of  children: Not on file   Years of education: Not on file   Highest education level: Not on file  Occupational History   Not on file  Tobacco Use   Smoking status: Former Smoker    Packs/day: 0.50    Types: Cigarettes   Smokeless tobacco: Never Used  Vaping Use   Vaping Use: Never used  Substance and Sexual Activity   Alcohol use: Yes    Comment: daily   Drug use: Yes    Types: Marijuana   Sexual activity: Not on file  Other Topics Concern   Not on file  Social History Narrative   Not on file   Social Determinants of Health   Financial Resource Strain:    Difficulty of Paying Living Expenses:   Food Insecurity:    Worried About Programme researcher, broadcasting/film/video in the Last Year:    Barista in the Last Year:   Transportation Needs:    Freight forwarder (Medical):    Lack of Transportation (Non-Medical):   Physical Activity:    Days of Exercise per Week:    Minutes of Exercise per Session:   Stress:    Feeling of Stress :   Social Connections:    Frequency of Communication with Friends and Family:    Frequency of Social Gatherings with Friends and Family:  Attends Religious Services:    Active Member of Clubs or Organizations:    Attends Banker Meetings:    Marital Status:   Intimate Partner Violence:    Fear of Current or Ex-Partner:    Emotionally Abused:    Physically Abused:    Sexually Abused:     Physical Exam Cardiovascular:     Rate and Rhythm: Normal rate and regular rhythm.     Pulses: Normal pulses.  Pulmonary:     Effort: Pulmonary effort is normal.     Breath sounds: Normal breath sounds.  Abdominal:     General: Abdomen is flat.  Musculoskeletal:        General: Normal range of motion.     Right lower leg: No edema.     Left lower leg: No edema.  Skin:    General: Skin is warm and dry.     Capillary Refill: Capillary refill takes less than 2 seconds.  Neurological:     Mental Status: He is alert  and oriented to person, place, and time.  Psychiatric:        Mood and Affect: Mood normal.         No future appointments.  BP (!) 125/55 (BP Location: Left Arm, Patient Position: Sitting, Cuff Size: Normal)    Pulse 64    Resp 16    Wt 128 lb 3.2 oz (58.2 kg)    SpO2 98%    BMI 20.69 kg/m   Weight yesterday- 127.6 lb Last visit weight- 123.3 lb  Jeremy Powers was seen at home today and reported feeling well. He denied chest pain, SOB, headache, dizziness, orthopnea, fever or cough. He stated he has been compliant with his medications over the past week and his weight has been generally stable. His medications were verified and he continues to use blister packs for his medications. I will follow up next week.   Jacqualine Code, EMT 11/18/19  ACTION: Home visit completed Next visit planned for 1 week

## 2019-11-25 ENCOUNTER — Other Ambulatory Visit (HOSPITAL_COMMUNITY): Payer: Self-pay | Admitting: Internal Medicine

## 2019-11-25 ENCOUNTER — Other Ambulatory Visit (HOSPITAL_COMMUNITY): Payer: Self-pay

## 2019-11-25 NOTE — Progress Notes (Signed)
Paramedicine Encounter    Patient ID: Jeremy Powers, male    DOB: 07-18-1955, 64 y.o.   MRN: [id number]   Patient Care Team: Jeremy Powers, Jeremy Powers as PCP - General (Internal Medicine)  Patient Active Problem List   Diagnosis Date Noted   Acute systolic (congestive) heart failure ([facility]) 06/24/2018   Aortic insufficiency 06/22/2018   Acute on chronic congestive heart failure ([facility]) 06/22/2018   Elevated troponin 06/22/2018   CAD (coronary artery disease) 06/22/2018   Left bundle branch block 06/22/2018   History of iron deficiency anemia 06/22/2018   Acute on chronic heart failure ([facility]) 06/22/2018    Current Outpatient Medications:    albuterol (VENTOLIN HFA) 108 (90 Base) MCG/ACT inhaler, Inhale 2 puffs into the lungs every 6 (six) hours as needed for wheezing or shortness of breath., Disp: 18 g, Rfl: 1   aspirin EC 81 MG tablet, Take 81 mg by mouth daily., Disp: , Rfl:    atorvastatin (LIPITOR) 40 MG tablet, TAKE 1 TABLET BY MOUTH DAILY AT 6 PM, Disp: 30 tablet, Rfl: 5   carvedilol (COREG) 6.25 MG tablet, Take 1 tablet (6.25 mg total) by mouth 2 (two) times daily., Disp: 180 tablet, Rfl: 1   digoxin (LANOXIN) 0.125 MG tablet, Take 1 tablet by mouth daily, Disp: 90 tablet, Rfl: 2   ferrous sulfate 325 (65 FE) MG tablet, Take 1 tablet (325 mg total) by mouth 2 (two) times daily with a meal., Disp: 180 tablet, Rfl: 3   furosemide (LASIX) 40 MG tablet, Take 1 tablet (40 mg total) by mouth daily as needed. Take 40 mg (1 tab) as needed for swelling., Disp: 30 tablet, Rfl: 3   sacubitril-valsartan (ENTRESTO) 49-51 MG, Take 1 tablet by mouth 2 (two) times daily., Disp: 60 tablet, Rfl: 11   spironolactone (ALDACTONE) 25 MG tablet, Take 1 tablet (25 mg total) by mouth at bedtime. Please call for office visit [phone], Disp: 30 tablet, Rfl: 5 No Known Allergies    Social History   Socioeconomic History   Marital status: Single    Spouse name: Not on file   Number of  children: Not on file   Years of education: Not on file   Highest education level: Not on file  Occupational History   Not on file  Tobacco Use   Smoking status: Former Smoker    Packs/day: 0.50    Types: Cigarettes   Smokeless tobacco: Never Used  Vaping Use   Vaping Use: Never used  Substance and Sexual Activity   Alcohol use: Yes    Comment: daily   Drug use: Yes    Types: Marijuana   Sexual activity: Not on file  Other Topics Concern   Not on file  Social History Narrative   Not on file   Social Determinants of Health   Financial Resource Strain:    Difficulty of Paying Living Expenses:   Food Insecurity:    Worried About Programme researcher, broadcasting/film/video in the Last Year:    Barista in the Last Year:   Transportation Needs:    Freight forwarder (Medical):    Lack of Transportation (Non-Medical):   Physical Activity:    Days of Exercise per Week:    Minutes of Exercise per Session:   Stress:    Feeling of Stress :   Social Connections:    Frequency of Communication with Friends and Family:    Frequency of Social Gatherings with Friends and Family:  Attends Religious Services:    Active Member of Clubs or Organizations:    Attends Banker Meetings:    Marital Status:   Intimate Partner Violence:    Fear of Current or Ex-Partner:    Emotionally Abused:    Physically Abused:    Sexually Abused:     Physical Exam Cardiovascular:     Rate and Rhythm: Regular rhythm. Bradycardia present.     Pulses: Normal pulses.  Pulmonary:     Effort: Pulmonary effort is normal.     Breath sounds: Normal breath sounds.  Musculoskeletal:        General: Normal range of motion.  Skin:    General: Skin is warm and dry.     Capillary Refill: Capillary refill takes less than 2 seconds.  Neurological:     Mental Status: He is alert and oriented to person, place, and time.  Psychiatric:        Mood and Affect: Mood normal.          No future appointments.  BP (!) 134/53 (BP Location: Left Arm, Patient Position: Sitting, Cuff Size: Normal)    Pulse (!) 55    Resp 16    Wt 131 lb 12.8 oz (59.8 kg)    SpO2 100%    BMI 21.27 kg/m   Weight yesterday- 133 lb Last visit weight- 128.2 lb  [name] was seen at home today and reported feeling well. He denied chest pain, SOB, headache, dizziness, orthopnea, fever or cough over the past week. He stated he has tried to take his medications daily but had missed several doses over the past week. I stressed the importance of medications compliance and he stated he would do better over the next week. All necessary refills were ordered and I will follow up next week.   Jacqualine Code, EMT 11/25/19  ACTION: Home visit completed Next visit planned for 1 week

## 2019-12-02 ENCOUNTER — Other Ambulatory Visit (HOSPITAL_COMMUNITY): Payer: Self-pay

## 2019-12-02 NOTE — Progress Notes (Signed)
Paramedicine Encounter    Patient ID: Jeremy Powers, male    DOB: [birth year], 64 y.o.   MRN: [id number]   Patient Care Team: [name] as PCP - General (Internal Medicine)  Patient Active Problem List   Diagnosis Date Noted  . Acute systolic (congestive) heart failure ([facility]) 06/24/2018  . Aortic insufficiency 06/22/2018  . Acute on chronic congestive heart failure ([facility]) 06/22/2018  . Elevated troponin 06/22/2018  . CAD (coronary artery disease) 06/22/2018  . Left bundle branch block 06/22/2018  . History of iron deficiency anemia 06/22/2018  . Acute on chronic heart failure ([facility]) 06/22/2018    Current Outpatient Medications:  .  albuterol (VENTOLIN HFA) 108 (90 Base) MCG/ACT inhaler, Inhale 2 puffs into the lungs every 6 (six) hours as needed for wheezing or shortness of breath., Disp: 18 g, Rfl: 1 .  aspirin EC 81 MG tablet, Take 81 mg by mouth daily., Disp: , Rfl:  .  atorvastatin (LIPITOR) 40 MG tablet, TAKE 1 TABLET BY MOUTH DAILY AT 6 PM, Disp: 30 tablet, Rfl: 5 .  carvedilol (COREG) 6.25 MG tablet, Take 1 tablet (6.25 mg total) by mouth 2 (two) times daily., Disp: 180 tablet, Rfl: 1 .  digoxin (LANOXIN) 0.125 MG tablet, Take 1 tablet by mouth daily, Disp: 90 tablet, Rfl: 2 .  ferrous sulfate 325 (65 FE) MG tablet, Take 1 tablet (325 mg total) by mouth 2 (two) times daily with a meal., Disp: 180 tablet, Rfl: 3 .  furosemide (LASIX) 40 MG tablet, Take 1 tablet (40 mg total) by mouth daily. As needed, please call for office visit [phone], Disp: 30 tablet, Rfl: 3 .  sacubitril-valsartan (ENTRESTO) 49-51 MG, Take 1 tablet by mouth 2 (two) times daily., Disp: 60 tablet, Rfl: 11 .  spironolactone (ALDACTONE) 25 MG tablet, Take 1 tablet (25 mg total) by mouth at bedtime. Please call for office visit [phone], Disp: 30 tablet, Rfl: 5 No Known Allergies    Social History   Socioeconomic History  . Marital status: Single    Spouse name: Not on file  . Number of  children: Not on file  . Years of education: Not on file  . Highest education level: Not on file  Occupational History  . Not on file  Tobacco Use  . Smoking status: Former Smoker    Packs/day: 0.50    Types: Cigarettes  . Smokeless tobacco: Never Used  Vaping Use  . Vaping Use: Never used  Substance and Sexual Activity  . Alcohol use: Yes    Comment: daily  . Drug use: Yes    Types: Marijuana  . Sexual activity: Not on file  Other Topics Concern  . Not on file  Social History Narrative  . Not on file   Social Determinants of Health   Financial Resource Strain:   . Difficulty of Paying Living Expenses:   Food Insecurity:   . Worried About Programme researcher, broadcasting/film/video in the Last Year:   . Barista in the Last Year:   Transportation Needs:   . Freight forwarder (Medical):   Marland Kitchen Lack of Transportation (Non-Medical):   Physical Activity:   . Days of Exercise per Week:   . Minutes of Exercise per Session:   Stress:   . Feeling of Stress :   Social Connections:   . Frequency of Communication with Friends and Family:   . Frequency of Social Gatherings with Friends and Family:   . Attends Religious Services:   .  Active Member of Clubs or Organizations:   . Attends Banker Meetings:   Marland Kitchen Marital Status:   Intimate Partner Violence:   . Fear of Current or Ex-Partner:   . Emotionally Abused:   Marland Kitchen Physically Abused:   . Sexually Abused:     Physical Exam Cardiovascular:     Rate and Rhythm: Regular rhythm. Bradycardia present.     Pulses: Normal pulses.  Pulmonary:     Effort: Pulmonary effort is normal.     Breath sounds: Normal breath sounds.  Musculoskeletal:        General: Normal range of motion.     Right lower leg: No edema.     Left lower leg: No edema.  Skin:    General: Skin is warm and dry.     Capillary Refill: Capillary refill takes less than 2 seconds.  Neurological:     Mental Status: He is alert and oriented to person, place, and  time.  Psychiatric:        Mood and Affect: Mood normal.         No future appointments.  BP (!) 120/50 (BP Location: Left Arm, Patient Position: Sitting, Cuff Size: Normal)   Pulse (!) 53   Resp 16   Wt 124 lb (56.2 kg)   SpO2 100%   BMI 20.01 kg/m   Weight yesterday- 133 lb Last visit weight- 131 lb  Mr Amrhein was seen at home today and reported feeling generally well. He denied chest pain, SOB, headache, dizziness, orthopnea, fever or cough over the past week. He stated he has been compliant with his medications over the past week and his weight has been stable. His medications were verified and he continues to use blister packs. Due to his success with blister packs I am going to stretch our visits out for two week sand see how he does. He was understanding and agreeable with ths approach.  Jacqualine Code, EMT 12/02/19  ACTION: Home visit completed Next visit planned for 2 weeks

## 2019-12-16 ENCOUNTER — Other Ambulatory Visit (HOSPITAL_COMMUNITY): Payer: Self-pay

## 2019-12-16 NOTE — Progress Notes (Signed)
Paramedicine Encounter    Patient ID: Jeremy Powers, male    DOB: 06/22/1955, 64 y.o.   MRN: [id number]   Patient Care Team: [name] as PCP - General (Internal Medicine)  Patient Active Problem List   Diagnosis Date Noted  . Acute systolic (congestive) heart failure (HCC) 06/24/2018  . Aortic insufficiency 06/22/2018  . Acute on chronic congestive heart failure (HCC) 06/22/2018  . Elevated troponin 06/22/2018  . CAD (coronary artery disease) 06/22/2018  . Left bundle branch block 06/22/2018  . History of iron deficiency anemia 06/22/2018  . Acute on chronic heart failure (HCC) 06/22/2018    Current Outpatient Medications:  .  albuterol (VENTOLIN HFA) 108 (90 Base) MCG/ACT inhaler, Inhale 2 puffs into the lungs every 6 (six) hours as needed for wheezing or shortness of breath., Disp: 18 g, Rfl: 1 .  aspirin EC 81 MG tablet, Take 81 mg by mouth daily., Disp: , Rfl:  .  atorvastatin (LIPITOR) 40 MG tablet, TAKE 1 TABLET BY MOUTH DAILY AT 6 PM, Disp: 30 tablet, Rfl: 5 .  carvedilol (COREG) 6.25 MG tablet, Take 1 tablet (6.25 mg total) by mouth 2 (two) times daily., Disp: 180 tablet, Rfl: 1 .  digoxin (LANOXIN) 0.125 MG tablet, Take 1 tablet by mouth daily, Disp: 90 tablet, Rfl: 2 .  ferrous sulfate 325 (65 FE) MG tablet, Take 1 tablet (325 mg total) by mouth 2 (two) times daily with a meal., Disp: 180 tablet, Rfl: 3 .  furosemide (LASIX) 40 MG tablet, Take 1 tablet (40 mg total) by mouth daily. As needed, please call for office visit [phone], Disp: 30 tablet, Rfl: 3 .  sacubitril-valsartan (ENTRESTO) 49-51 MG, Take 1 tablet by mouth 2 (two) times daily., Disp: 60 tablet, Rfl: 11 .  spironolactone (ALDACTONE) 25 MG tablet, Take 1 tablet (25 mg total) by mouth at bedtime. Please call for office visit [phone], Disp: 30 tablet, Rfl: 5 No Known Allergies    Social History   Socioeconomic History  . Marital status: Single    Spouse name: Not on file  . Number of  children: Not on file  . Years of education: Not on file  . Highest education level: Not on file  Occupational History  . Not on file  Tobacco Use  . Smoking status: Former Smoker    Packs/day: 0.50    Types: Cigarettes  . Smokeless tobacco: Never Used  Vaping Use  . Vaping Use: Never used  Substance and Sexual Activity  . Alcohol use: Yes    Comment: daily  . Drug use: Yes    Types: Marijuana  . Sexual activity: Not on file  Other Topics Concern  . Not on file  Social History Narrative  . Not on file   Social Determinants of Health   Financial Resource Strain:   . Difficulty of Paying Living Expenses:   Food Insecurity:   . Worried About Programme researcher, broadcasting/film/video in the Last Year:   . Barista in the Last Year:   Transportation Needs:   . Freight forwarder (Medical):   Marland Kitchen Lack of Transportation (Non-Medical):   Physical Activity:   . Days of Exercise per Week:   . Minutes of Exercise per Session:   Stress:   . Feeling of Stress :   Social Connections:   . Frequency of Communication with Friends and Family:   . Frequency of Social Gatherings with Friends and Family:   . Attends Religious Services:   .  Active Member of Clubs or Organizations:   . Attends Banker Meetings:   Marland Kitchen Marital Status:   Intimate Partner Violence:   . Fear of Current or Ex-Partner:   . Emotionally Abused:   Marland Kitchen Physically Abused:   . Sexually Abused:     Physical Exam Cardiovascular:     Rate and Rhythm: Normal rate and regular rhythm.     Pulses: Normal pulses.  Pulmonary:     Effort: Pulmonary effort is normal.     Breath sounds: Normal breath sounds.  Musculoskeletal:        General: Normal range of motion.     Right lower leg: No edema.     Left lower leg: No edema.  Skin:    General: Skin is warm and dry.     Capillary Refill: Capillary refill takes less than 2 seconds.  Neurological:     Mental Status: He is alert.         No future  appointments.  BP (!) 128/57 (BP Location: Left Arm, Patient Position: Sitting, Cuff Size: Normal)   Pulse 66   Resp 18   Wt 124 lb 6.4 oz (56.4 kg)   SpO2 100%   BMI 20.08 kg/m   Weight yesterday- did not weigh Last visit weight- 124.8 lb  Mr Ozer was seen at home today and reported feeling well. He denied chest pain, SOB, headache, dizziness, orthopnea, fever or cough over the past two weeks. He stated he has been compliant with his medications since our last visit and his weight has been stable. His medications were verified and he continues to use blister packs. I will follow up in two weeks.   Jacqualine Code, EMT 12/16/19  ACTION: Home visit completed Next visit planned for 2 weeks

## 2019-12-30 ENCOUNTER — Other Ambulatory Visit (HOSPITAL_COMMUNITY): Payer: Self-pay

## 2019-12-30 NOTE — Progress Notes (Signed)
Paramedicine Encounter    Patient ID: Jeremy Powers, male    DOB: 1955-05-26, 64 y.o.   MRN: [id number]   Patient Care Team: [name], Cordelia Poche as PCP - General (Internal Medicine)  Patient Active Problem List   Diagnosis Date Noted   Acute systolic (congestive) heart failure (HCC) 06/24/2018   Aortic insufficiency 06/22/2018   Acute on chronic congestive heart failure (HCC) 06/22/2018   Elevated troponin 06/22/2018   CAD (coronary artery disease) 06/22/2018   Left bundle branch block 06/22/2018   History of iron deficiency anemia 06/22/2018   Acute on chronic heart failure (HCC) 06/22/2018    Current Outpatient Medications:    albuterol (VENTOLIN HFA) 108 (90 Base) MCG/ACT inhaler, Inhale 2 puffs into the lungs every 6 (six) hours as needed for wheezing or shortness of breath., Disp: 18 g, Rfl: 1   aspirin EC 81 MG tablet, Take 81 mg by mouth daily., Disp: , Rfl:    atorvastatin (LIPITOR) 40 MG tablet, TAKE 1 TABLET BY MOUTH DAILY AT 6 PM, Disp: 30 tablet, Rfl: 5   carvedilol (COREG) 6.25 MG tablet, Take 1 tablet (6.25 mg total) by mouth 2 (two) times daily., Disp: 180 tablet, Rfl: 1   digoxin (LANOXIN) 0.125 MG tablet, Take 1 tablet by mouth daily, Disp: 90 tablet, Rfl: 2   ferrous sulfate 325 (65 FE) MG tablet, Take 1 tablet (325 mg total) by mouth 2 (two) times daily with a meal., Disp: 180 tablet, Rfl: 3   furosemide (LASIX) 40 MG tablet, Take 1 tablet (40 mg total) by mouth daily. As needed, please call for office visit [phone], Disp: 30 tablet, Rfl: 3   sacubitril-valsartan (ENTRESTO) 49-51 MG, Take 1 tablet by mouth 2 (two) times daily., Disp: 60 tablet, Rfl: 11   spironolactone (ALDACTONE) 25 MG tablet, Take 1 tablet (25 mg total) by mouth at bedtime. Please call for office visit [phone], Disp: 30 tablet, Rfl: 5 No Known Allergies    Social History   Socioeconomic History   Marital status: Single    Spouse name: Not on file   Number of  children: Not on file   Years of education: Not on file   Highest education level: Not on file  Occupational History   Not on file  Tobacco Use   Smoking status: Former Smoker    Packs/day: 0.50    Types: Cigarettes   Smokeless tobacco: Never Used  Vaping Use   Vaping Use: Never used  Substance and Sexual Activity   Alcohol use: Yes    Comment: daily   Drug use: Yes    Types: Marijuana   Sexual activity: Not on file  Other Topics Concern   Not on file  Social History Narrative   Not on file   Social Determinants of Health   Financial Resource Strain:    Difficulty of Paying Living Expenses:   Food Insecurity:    Worried About Programme researcher, broadcasting/film/video in the Last Year:    Barista in the Last Year:   Transportation Needs:    Freight forwarder (Medical):    Lack of Transportation (Non-Medical):   Physical Activity:    Days of Exercise per Week:    Minutes of Exercise per Session:   Stress:    Feeling of Stress :   Social Connections:    Frequency of Communication with Friends and Family:    Frequency of Social Gatherings with Friends and Family:    Attends Religious Services:  Active Member of Clubs or Organizations:    Attends Banker Meetings:    Marital Status:   Intimate Partner Violence:    Fear of Current or Ex-Partner:    Emotionally Abused:    Physically Abused:    Sexually Abused:     Physical Exam Cardiovascular:     Rate and Rhythm: Normal rate and regular rhythm.     Pulses: Normal pulses.  Pulmonary:     Effort: Pulmonary effort is normal.     Breath sounds: Normal breath sounds.  Musculoskeletal:        General: Normal range of motion.     Right lower leg: No edema.     Left lower leg: No edema.  Skin:    General: Skin is warm and dry.     Capillary Refill: Capillary refill takes less than 2 seconds.  Neurological:     Mental Status: He is alert and oriented to person, place, and time.   Psychiatric:        Mood and Affect: Mood normal.         No future appointments.  BP (!) 113/53 (BP Location: Left Arm, Patient Position: Sitting, Cuff Size: Normal)    Pulse 60    Resp 16    Wt 131 lb 3.2 oz (59.5 kg)    SpO2 99%    BMI 21.18 kg/m   Weight yesterday- 135.4 lb Last visit weight- 124.4 lb  Mr Jeremy Powers was seen at home today and reported feeling well. He denied chest pain, SOB, headache, dizziness, orthopnea, fever or cough over the past two weeks. He reported missing several doses of medications stating, "I've been messing up." Upon looking through his medication packs I noted that he has been struggling with compliance for a few weeks. We spoke at length about this and he stated he would try to do better. I will begin weekly visits again in an effort to get him back on track starting next week.   Jacqualine Code, EMT 12/30/19  ACTION: Home visit completed Next visit planned for 1 week

## 2020-01-06 ENCOUNTER — Other Ambulatory Visit (HOSPITAL_COMMUNITY): Payer: Self-pay

## 2020-01-06 NOTE — Progress Notes (Signed)
Paramedicine Encounter    Patient ID: Jeremy Powers, male    DOB: 08/14/1955, 64 y.o.   MRN: [id number]   Patient Care Team: [name] as PCP - General (Internal Medicine)  Patient Active Problem List   Diagnosis Date Noted  . Acute systolic (congestive) heart failure (HCC) 06/24/2018  . Aortic insufficiency 06/22/2018  . Acute on chronic congestive heart failure (HCC) 06/22/2018  . Elevated troponin 06/22/2018  . CAD (coronary artery disease) 06/22/2018  . Left bundle branch block 06/22/2018  . History of iron deficiency anemia 06/22/2018  . Acute on chronic heart failure (HCC) 06/22/2018    Current Outpatient Medications:  .  albuterol (VENTOLIN HFA) 108 (90 Base) MCG/ACT inhaler, Inhale 2 puffs into the lungs every 6 (six) hours as needed for wheezing or shortness of breath., Disp: 18 g, Rfl: 1 .  aspirin EC 81 MG tablet, Take 81 mg by mouth daily., Disp: , Rfl:  .  atorvastatin (LIPITOR) 40 MG tablet, TAKE 1 TABLET BY MOUTH DAILY AT 6 PM, Disp: 30 tablet, Rfl: 5 .  carvedilol (COREG) 6.25 MG tablet, Take 1 tablet (6.25 mg total) by mouth 2 (two) times daily., Disp: 180 tablet, Rfl: 1 .  digoxin (LANOXIN) 0.125 MG tablet, Take 1 tablet by mouth daily, Disp: 90 tablet, Rfl: 2 .  ferrous sulfate 325 (65 FE) MG tablet, Take 1 tablet (325 mg total) by mouth 2 (two) times daily with a meal., Disp: 180 tablet, Rfl: 3 .  furosemide (LASIX) 40 MG tablet, Take 1 tablet (40 mg total) by mouth daily. As needed, please call for office visit [phone], Disp: 30 tablet, Rfl: 3 .  spironolactone (ALDACTONE) 25 MG tablet, Take 1 tablet (25 mg total) by mouth at bedtime. Please call for office visit [phone], Disp: 30 tablet, Rfl: 5 .  sacubitril-valsartan (ENTRESTO) 49-51 MG, Take 1 tablet by mouth 2 (two) times daily., Disp: 60 tablet, Rfl: 11 No Known Allergies    Social History   Socioeconomic History  . Marital status: Single    Spouse name: Not on file  . Number of  children: Not on file  . Years of education: Not on file  . Highest education level: Not on file  Occupational History  . Not on file  Tobacco Use  . Smoking status: Former Smoker    Packs/day: 0.50    Types: Cigarettes  . Smokeless tobacco: Never Used  Vaping Use  . Vaping Use: Never used  Substance and Sexual Activity  . Alcohol use: Yes    Comment: daily  . Drug use: Yes    Types: Marijuana  . Sexual activity: Not on file  Other Topics Concern  . Not on file  Social History Narrative  . Not on file   Social Determinants of Health   Financial Resource Strain:   . Difficulty of Paying Living Expenses:   Food Insecurity:   . Worried About Programme researcher, broadcasting/film/video in the Last Year:   . Barista in the Last Year:   Transportation Needs:   . Freight forwarder (Medical):   Marland Kitchen Lack of Transportation (Non-Medical):   Physical Activity:   . Days of Exercise per Week:   . Minutes of Exercise per Session:   Stress:   . Feeling of Stress :   Social Connections:   . Frequency of Communication with Friends and Family:   . Frequency of Social Gatherings with Friends and Family:   . Attends Religious Services:   .  Active Member of Clubs or Organizations:   . Attends Banker Meetings:   Marland Kitchen Marital Status:   Intimate Partner Violence:   . Fear of Current or Ex-Partner:   . Emotionally Abused:   Marland Kitchen Physically Abused:   . Sexually Abused:     Physical Exam Cardiovascular:     Rate and Rhythm: Normal rate and regular rhythm.     Pulses: Normal pulses.  Pulmonary:     Effort: Pulmonary effort is normal.     Breath sounds: Normal breath sounds.  Musculoskeletal:        General: Normal range of motion.     Right lower leg: No edema.     Left lower leg: No edema.  Skin:    General: Skin is warm and dry.     Capillary Refill: Capillary refill takes less than 2 seconds.  Neurological:     Mental Status: He is alert and oriented to person, place, and time.   Psychiatric:        Mood and Affect: Mood normal.         No future appointments.  BP 136/61 (BP Location: Left Arm, Patient Position: Sitting, Cuff Size: Normal)   Pulse 62   Resp 16   Wt 132 lb 9.6 oz (60.1 kg)   SpO2 98%   BMI 21.40 kg/m   Weight yesterday- 133.6 lb Last visit weight- 131.2 lb  [name] was seen at home today and reported feeling well. He denied chest pain, SOB. Headache, dizziness, orthopnea, fever or cough over the past week. He has not been compliant with his medications since our last visit, missing 5 out of 7 evening slots of his pillbox and 3 out of 7 of his morning slots. I stressed the importance of being medication compliant and he was understanding and said he would "do better." His medications were verified and his pillbox was refilled. I will follow up next week.   Jacqualine Code, EMT 01/06/20  ACTION: Home visit completed Next visit planned for 1 week

## 2020-01-13 ENCOUNTER — Other Ambulatory Visit (HOSPITAL_COMMUNITY): Payer: Self-pay

## 2020-01-13 NOTE — Progress Notes (Signed)
Paramedicine Encounter    Patient ID: Jeremy Powers, male    DOB: 12-Jul-1955, 64 y.o.   MRN: [id number]   Patient Care Team: [name] as PCP - General (Internal Medicine)  Patient Active Problem List   Diagnosis Date Noted  . Acute systolic (congestive) heart failure (HCC) 06/24/2018  . Aortic insufficiency 06/22/2018  . Acute on chronic congestive heart failure (HCC) 06/22/2018  . Elevated troponin 06/22/2018  . CAD (coronary artery disease) 06/22/2018  . Left bundle branch block 06/22/2018  . History of iron deficiency anemia 06/22/2018  . Acute on chronic heart failure (HCC) 06/22/2018    Current Outpatient Medications:  .  albuterol (VENTOLIN HFA) 108 (90 Base) MCG/ACT inhaler, Inhale 2 puffs into the lungs every 6 (six) hours as needed for wheezing or shortness of breath., Disp: 18 g, Rfl: 1 .  aspirin EC 81 MG tablet, Take 81 mg by mouth daily., Disp: , Rfl:  .  atorvastatin (LIPITOR) 40 MG tablet, TAKE 1 TABLET BY MOUTH DAILY AT 6 PM, Disp: 30 tablet, Rfl: 5 .  carvedilol (COREG) 6.25 MG tablet, Take 1 tablet (6.25 mg total) by mouth 2 (two) times daily., Disp: 180 tablet, Rfl: 1 .  digoxin (LANOXIN) 0.125 MG tablet, Take 1 tablet by mouth daily, Disp: 90 tablet, Rfl: 2 .  ferrous sulfate 325 (65 FE) MG tablet, Take 1 tablet (325 mg total) by mouth 2 (two) times daily with a meal., Disp: 180 tablet, Rfl: 3 .  furosemide (LASIX) 40 MG tablet, Take 1 tablet (40 mg total) by mouth daily. As needed, please call for office visit [phone], Disp: 30 tablet, Rfl: 3 .  sacubitril-valsartan (ENTRESTO) 49-51 MG, Take 1 tablet by mouth 2 (two) times daily., Disp: 60 tablet, Rfl: 11 .  spironolactone (ALDACTONE) 25 MG tablet, Take 1 tablet (25 mg total) by mouth at bedtime. Please call for office visit [phone], Disp: 30 tablet, Rfl: 5 No Known Allergies    Social History   Socioeconomic History  . Marital status: Single    Spouse name: Not on file  . Number of  children: Not on file  . Years of education: Not on file  . Highest education level: Not on file  Occupational History  . Not on file  Tobacco Use  . Smoking status: Former Smoker    Packs/day: 0.50    Types: Cigarettes  . Smokeless tobacco: Never Used  Vaping Use  . Vaping Use: Never used  Substance and Sexual Activity  . Alcohol use: Yes    Comment: daily  . Drug use: Yes    Types: Marijuana  . Sexual activity: Not on file  Other Topics Concern  . Not on file  Social History Narrative  . Not on file   Social Determinants of Health   Financial Resource Strain:   . Difficulty of Paying Living Expenses: Not on file  Food Insecurity:   . Worried About Programme researcher, broadcasting/film/video in the Last Year: Not on file  . Ran Out of Food in the Last Year: Not on file  Transportation Needs:   . Lack of Transportation (Medical): Not on file  . Lack of Transportation (Non-Medical): Not on file  Physical Activity:   . Days of Exercise per Week: Not on file  . Minutes of Exercise per Session: Not on file  Stress:   . Feeling of Stress : Not on file  Social Connections:   . Frequency of Communication with Friends and Family: Not  on file  . Frequency of Social Gatherings with Friends and Family: Not on file  . Attends Religious Services: Not on file  . Active Member of Clubs or Organizations: Not on file  . Attends Banker Meetings: Not on file  . Marital Status: Not on file  Intimate Partner Violence:   . Fear of Current or Ex-Partner: Not on file  . Emotionally Abused: Not on file  . Physically Abused: Not on file  . Sexually Abused: Not on file    Physical Exam Cardiovascular:     Rate and Rhythm: Normal rate and regular rhythm.     Pulses: Normal pulses.  Pulmonary:     Effort: Pulmonary effort is normal.     Breath sounds: Normal breath sounds.  Musculoskeletal:        General: Normal range of motion.  Skin:    General: Skin is warm and dry.     Capillary Refill:  Capillary refill takes less than 2 seconds.  Neurological:     Mental Status: He is alert and oriented to person, place, and time.  Psychiatric:        Mood and Affect: Mood normal.         No future appointments.  BP (!) 131/56 (BP Location: Left Arm, Patient Position: Sitting, Cuff Size: Normal)   Pulse 62   Resp 16   Wt 125 lb 12.8 oz (57.1 kg)   SpO2 99%   BMI 20.30 kg/m   Weight yesterday- 130.6 lb Last visit weight- 132.6 lb  Mr Nazir was seen at home today and reported feeling well. He denied chest pain, SOB, headache, dizziness, orthopnea, fever or cough over the past week. He stated he has been compliant with his medications and his weight has been stable. His medications were verified and his pillbox was refilled. I will follow up next week.   Jacqualine Code, EMT 01/13/20  ACTION: Home visit completed Next visit planned for 1 week

## 2020-01-20 ENCOUNTER — Other Ambulatory Visit: Payer: Self-pay | Admitting: Cardiology

## 2020-01-20 ENCOUNTER — Other Ambulatory Visit (HOSPITAL_COMMUNITY): Payer: Self-pay

## 2020-01-20 NOTE — Progress Notes (Signed)
Paramedicine Encounter    Patient ID: Jeremy Powers, male    DOB: 08/05/1955, 64 y.o.   MRN: [id number]   Patient Care Team: [name], Cordelia Poche as PCP - General (Internal Medicine)  Patient Active Problem List   Diagnosis Date Noted   Acute systolic (congestive) heart failure (HCC) 06/24/2018   Aortic insufficiency 06/22/2018   Acute on chronic congestive heart failure (HCC) 06/22/2018   Elevated troponin 06/22/2018   CAD (coronary artery disease) 06/22/2018   Left bundle branch block 06/22/2018   History of iron deficiency anemia 06/22/2018   Acute on chronic heart failure (HCC) 06/22/2018    Current Outpatient Medications:    albuterol (VENTOLIN HFA) 108 (90 Base) MCG/ACT inhaler, Inhale 2 puffs into the lungs every 6 (six) hours as needed for wheezing or shortness of breath., Disp: 18 g, Rfl: 1   aspirin EC 81 MG tablet, Take 81 mg by mouth daily., Disp: , Rfl:    atorvastatin (LIPITOR) 40 MG tablet, TAKE 1 TABLET BY MOUTH DAILY AT 6 PM, Disp: 30 tablet, Rfl: 5   carvedilol (COREG) 6.25 MG tablet, Take 1 tablet (6.25 mg total) by mouth 2 (two) times daily., Disp: 180 tablet, Rfl: 1   digoxin (LANOXIN) 0.125 MG tablet, Take 1 tablet by mouth daily, Disp: 90 tablet, Rfl: 2   ferrous sulfate 325 (65 FE) MG tablet, Take 1 tablet (325 mg total) by mouth 2 (two) times daily with a meal., Disp: 180 tablet, Rfl: 3   furosemide (LASIX) 40 MG tablet, Take 1 tablet (40 mg total) by mouth daily. As needed, please call for office visit [phone], Disp: 30 tablet, Rfl: 3   sacubitril-valsartan (ENTRESTO) 49-51 MG, Take 1 tablet by mouth 2 (two) times daily., Disp: 60 tablet, Rfl: 11   spironolactone (ALDACTONE) 25 MG tablet, Take 1 tablet (25 mg total) by mouth at bedtime. Please call for office visit [phone], Disp: 30 tablet, Rfl: 5 No Known Allergies    Social History   Socioeconomic History   Marital status: Single    Spouse name: Not on file   Number of  children: Not on file   Years of education: Not on file   Highest education level: Not on file  Occupational History   Not on file  Tobacco Use   Smoking status: Former Smoker    Packs/day: 0.50    Types: Cigarettes   Smokeless tobacco: Never Used  Vaping Use   Vaping Use: Never used  Substance and Sexual Activity   Alcohol use: Yes    Comment: daily   Drug use: Yes    Types: Marijuana   Sexual activity: Not on file  Other Topics Concern   Not on file  Social History Narrative   Not on file   Social Determinants of Health   Financial Resource Strain:    Difficulty of Paying Living Expenses: Not on file  Food Insecurity:    Worried About Running Out of Food in the Last Year: Not on file   Ran Out of Food in the Last Year: Not on file  Transportation Needs:    Lack of Transportation (Medical): Not on file   Lack of Transportation (Non-Medical): Not on file  Physical Activity:    Days of Exercise per Week: Not on file   Minutes of Exercise per Session: Not on file  Stress:    Feeling of Stress : Not on file  Social Connections:    Frequency of Communication with Friends and Family: Not  on file   Frequency of Social Gatherings with Friends and Family: Not on file   Attends Religious Services: Not on file   Active Member of Clubs or Organizations: Not on file   Attends Banker Meetings: Not on file   Marital Status: Not on file  Intimate Partner Violence:    Fear of Current or Ex-Partner: Not on file   Emotionally Abused: Not on file   Physically Abused: Not on file   Sexually Abused: Not on file    Physical Exam Cardiovascular:     Rate and Rhythm: Normal rate and regular rhythm.     Pulses: Normal pulses.  Pulmonary:     Effort: Pulmonary effort is normal.     Breath sounds: Normal breath sounds.  Musculoskeletal:        General: Normal range of motion.     Right lower leg: No edema.     Left lower leg: No edema.   Skin:    General: Skin is warm and dry.     Capillary Refill: Capillary refill takes less than 2 seconds.  Neurological:     Mental Status: He is alert and oriented to person, place, and time.  Psychiatric:        Mood and Affect: Mood normal.         No future appointments.  BP (!) 138/53 (BP Location: Left Arm, Patient Position: Sitting, Cuff Size: Normal)    Pulse 64    Resp 16    Wt 124 lb (56.2 kg)    SpO2 99%    BMI 20.01 kg/m   Weight yesterday- 128.4 lb Last visit weight- 125.8 lb  Jeremy Powers was seen at home today and reported feeling well. He denied chest pain, SOB, headache, dizziness, orthopnea, fever or cough over the past week. He stated he has been compliant with his medications over the past week and his weight has been stable. His medications were verified and his pillbox was refilled. I will follow up next week.   Jacqualine Code, EMT 01/20/20  ACTION: Home visit completed Next visit planned for 1 week

## 2020-01-28 ENCOUNTER — Other Ambulatory Visit (HOSPITAL_COMMUNITY): Payer: Self-pay

## 2020-01-28 NOTE — Progress Notes (Signed)
Paramedicine Encounter    Patient ID: Jeremy Powers, male    DOB: 07/14/1955, 64 y.o.   MRN: [id number]   Patient Care Team: [name] as PCP - General (Internal Medicine)  Patient Active Problem List   Diagnosis Date Noted  . Acute systolic (congestive) heart failure (HCC) 06/24/2018  . Aortic insufficiency 06/22/2018  . Acute on chronic congestive heart failure (HCC) 06/22/2018  . Elevated troponin 06/22/2018  . CAD (coronary artery disease) 06/22/2018  . Left bundle branch block 06/22/2018  . History of iron deficiency anemia 06/22/2018  . Acute on chronic heart failure (HCC) 06/22/2018    Current Outpatient Medications:  .  albuterol (VENTOLIN HFA) 108 (90 Base) MCG/ACT inhaler, Inhale 2 puffs into the lungs every 6 (six) hours as needed for wheezing or shortness of breath., Disp: 18 g, Rfl: 1 .  aspirin EC 81 MG tablet, Take 81 mg by mouth daily., Disp: , Rfl:  .  atorvastatin (LIPITOR) 40 MG tablet, TAKE 1 TABLET BY MOUTH DAILY AT 6 PM, Disp: 30 tablet, Rfl: 5 .  carvedilol (COREG) 6.25 MG tablet, Take 1 tablet (6.25 mg total) by mouth 2 (two) times daily., Disp: 180 tablet, Rfl: 1 .  digoxin (LANOXIN) 0.125 MG tablet, Take 1 tablet by mouth daily, Disp: 90 tablet, Rfl: 2 .  ferrous sulfate 325 (65 FE) MG tablet, Take 1 tablet (325 mg total) by mouth 2 (two) times daily with a meal., Disp: 180 tablet, Rfl: 3 .  furosemide (LASIX) 40 MG tablet, Take 1 tablet (40 mg total) by mouth daily. As needed, please call for office visit [phone], Disp: 30 tablet, Rfl: 3 .  sacubitril-valsartan (ENTRESTO) 49-51 MG, Take 1 tablet by mouth 2 (two) times daily., Disp: 60 tablet, Rfl: 11 .  spironolactone (ALDACTONE) 25 MG tablet, TAKE 1 TABLET BY MOUTH EACH NIGHT AT BEDTIME, Disp: 30 tablet, Rfl: 2 No Known Allergies    Social History   Socioeconomic History  . Marital status: Single    Spouse name: Not on file  . Number of children: Not on file  . Years of education:  Not on file  . Highest education level: Not on file  Occupational History  . Not on file  Tobacco Use  . Smoking status: Former Smoker    Packs/day: 0.50    Types: Cigarettes  . Smokeless tobacco: Never Used  Vaping Use  . Vaping Use: Never used  Substance and Sexual Activity  . Alcohol use: Yes    Comment: daily  . Drug use: Yes    Types: Marijuana  . Sexual activity: Not on file  Other Topics Concern  . Not on file  Social History Narrative  . Not on file   Social Determinants of Health   Financial Resource Strain:   . Difficulty of Paying Living Expenses: Not on file  Food Insecurity:   . Worried About Programme researcher, broadcasting/film/video in the Last Year: Not on file  . Ran Out of Food in the Last Year: Not on file  Transportation Needs:   . Lack of Transportation (Medical): Not on file  . Lack of Transportation (Non-Medical): Not on file  Physical Activity:   . Days of Exercise per Week: Not on file  . Minutes of Exercise per Session: Not on file  Stress:   . Feeling of Stress : Not on file  Social Connections:   . Frequency of Communication with Friends and Family: Not on file  . Frequency of Social  Gatherings with Friends and Family: Not on file  . Attends Religious Services: Not on file  . Active Member of Clubs or Organizations: Not on file  . Attends Banker Meetings: Not on file  . Marital Status: Not on file  Intimate Partner Violence:   . Fear of Current or Ex-Partner: Not on file  . Emotionally Abused: Not on file  . Physically Abused: Not on file  . Sexually Abused: Not on file    Physical Exam Cardiovascular:     Rate and Rhythm: Normal rate and regular rhythm.     Pulses: Normal pulses.  Pulmonary:     Effort: Pulmonary effort is normal.     Breath sounds: Normal breath sounds.  Musculoskeletal:        General: Normal range of motion.     Right lower leg: No edema.     Left lower leg: No edema.  Skin:    General: Skin is warm and dry.      Capillary Refill: Capillary refill takes less than 2 seconds.  Neurological:     Mental Status: He is alert and oriented to person, place, and time.  Psychiatric:        Mood and Affect: Mood normal.         No future appointments.  BP 116/70 (BP Location: Left Arm, Patient Position: Sitting, Cuff Size: Normal)   Pulse 62   Resp 16   Wt 120 lb (54.4 kg)   SpO2 98%   BMI 19.37 kg/m   Weight yesterday- did not weigh Last visit weight- 124 lb  Jeremy Powers was seen at home today and reported feeling well. He denied chest pain, SOB, headache, dizziness, orthopnea, fever or cough. He stated he has been compliant with his medications over the past week and his weight has been stable. His medications were verified and his pillbox was refilled. I will follow up in two weeks.   Jacqualine Code, EMT 01/28/20  ACTION: Home visit completed Next visit planned for 2 weeks

## 2020-02-10 ENCOUNTER — Other Ambulatory Visit (HOSPITAL_COMMUNITY): Payer: Self-pay

## 2020-02-10 ENCOUNTER — Telehealth (HOSPITAL_COMMUNITY): Payer: Self-pay

## 2020-02-10 MED ORDER — CARVEDILOL 3.125 MG PO TABS: 3.1250 mg | ORAL_TABLET | Freq: Two times a day (BID) | ORAL | 3 refills | Status: DC

## 2020-02-10 NOTE — Progress Notes (Signed)
Paramedicine Encounter    Patient ID: Jeremy Powers, male    DOB: Aug 30, 1955, 64 y.o.   MRN: [id number]   Patient Care Team: Jeremy Powers as PCP - General (Internal Medicine)  Patient Active Problem List   Diagnosis Date Noted  . Acute systolic (congestive) heart failure (HCC) 06/24/2018  . Aortic insufficiency 06/22/2018  . Acute on chronic congestive heart failure (HCC) 06/22/2018  . Elevated troponin 06/22/2018  . CAD (coronary artery disease) 06/22/2018  . Left bundle branch block 06/22/2018  . History of iron deficiency anemia 06/22/2018  . Acute on chronic heart failure (HCC) 06/22/2018    Current Outpatient Medications:  .  albuterol (VENTOLIN HFA) 108 (90 Base) MCG/ACT inhaler, Inhale 2 puffs into the lungs every 6 (six) hours as needed for wheezing or shortness of breath., Disp: 18 g, Rfl: 1 .  aspirin EC 81 MG tablet, Take 81 mg by mouth daily., Disp: , Rfl:  .  atorvastatin (LIPITOR) 40 MG tablet, TAKE 1 TABLET BY MOUTH DAILY AT 6 PM, Disp: 30 tablet, Rfl: 5 .  carvedilol (COREG) 6.25 MG tablet, Take 1 tablet (6.25 mg total) by mouth 2 (two) times daily., Disp: 180 tablet, Rfl: 1 .  digoxin (LANOXIN) 0.125 MG tablet, Take 1 tablet by mouth daily, Disp: 90 tablet, Rfl: 2 .  ferrous sulfate 325 (65 FE) MG tablet, Take 1 tablet (325 mg total) by mouth 2 (two) times daily with a meal., Disp: 180 tablet, Rfl: 3 .  furosemide (LASIX) 40 MG tablet, Take 1 tablet (40 mg total) by mouth daily. As needed, please call for office visit [phone], Disp: 30 tablet, Rfl: 3 .  sacubitril-valsartan (ENTRESTO) 49-51 MG, Take 1 tablet by mouth 2 (two) times daily., Disp: 60 tablet, Rfl: 11 .  spironolactone (ALDACTONE) 25 MG tablet, TAKE 1 TABLET BY MOUTH EACH NIGHT AT BEDTIME, Disp: 30 tablet, Rfl: 2 No Known Allergies    Social History   Socioeconomic History  . Marital status: Single    Spouse name: Not on file  . Number of children: Not on file  . Years of education:  Not on file  . Highest education level: Not on file  Occupational History  . Not on file  Tobacco Use  . Smoking status: Former Smoker    Packs/day: 0.50    Types: Cigarettes  . Smokeless tobacco: Never Used  Vaping Use  . Vaping Use: Never used  Substance and Sexual Activity  . Alcohol use: Yes    Comment: daily  . Drug use: Yes    Types: Marijuana  . Sexual activity: Not on file  Other Topics Concern  . Not on file  Social History Narrative  . Not on file   Social Determinants of Health   Financial Resource Strain:   . Difficulty of Paying Living Expenses: Not on file  Food Insecurity:   . Worried About Programme researcher, broadcasting/film/video in the Last Year: Not on file  . Ran Out of Food in the Last Year: Not on file  Transportation Needs:   . Lack of Transportation (Medical): Not on file  . Lack of Transportation (Non-Medical): Not on file  Physical Activity:   . Days of Exercise per Week: Not on file  . Minutes of Exercise per Session: Not on file  Stress:   . Feeling of Stress : Not on file  Social Connections:   . Frequency of Communication with Friends and Family: Not on file  . Frequency of Social  Gatherings with Friends and Family: Not on file  . Attends Religious Services: Not on file  . Active Member of Clubs or Organizations: Not on file  . Attends Banker Meetings: Not on file  . Marital Status: Not on file  Intimate Partner Violence:   . Fear of Current or Ex-Partner: Not on file  . Emotionally Abused: Not on file  . Physically Abused: Not on file  . Sexually Abused: Not on file    Physical Exam Cardiovascular:     Rate and Rhythm: Regular rhythm. Bradycardia present.     Pulses: Normal pulses.  Pulmonary:     Effort: Pulmonary effort is normal.     Breath sounds: Normal breath sounds.  Musculoskeletal:        General: Normal range of motion.     Right lower leg: No edema.     Left lower leg: No edema.  Skin:    General: Skin is warm and dry.      Capillary Refill: Capillary refill takes less than 2 seconds.  Neurological:     Mental Status: He is alert and oriented to person, place, and time.  Psychiatric:        Mood and Affect: Mood normal.         No future appointments.  BP (!) 96/40 (BP Location: Left Arm, Patient Position: Sitting, Cuff Size: Normal)   Pulse (!) 56   Resp 16   Wt 121 lb 6.4 oz (55.1 kg)   SpO2 99%   BMI 19.59 kg/m   Weight yesterday- 124 lb Last visit weight- 120 lb  Jeremy Powers was seen at home today and reported feeling well. He denied chest pain, SOB, headache, dizziness, orthopnea, fever or cough over the past two weeks. He has been intermittently taking his medications but reported being fully compliant over the past two days. He stated that he does not eat much and is not drinking more than one bottle of water per day. He reported sometimes in the early mornings and late evenings he feels weak which could be a result of taking his medications on an empty stomach. He has means to buy food but just said he does not have an appetite all the time. I encouraged him to drink between 3 and 4 bottles of water per day and be sure to eat with his food. I contacted the clinic due to his hypotension and was advised to hold coreg tonight and then begin at a lower dose of 3.125 mg BID tomorrow morning. The necessary changes were made to his pillbox and a new prescription was sent to the pharmacy by the clinic. I will follow up next week unless it becomes necessary sooner.   Jeremy Powers, EMT 02/10/20  ACTION: Home visit completed Next visit planned for 1 week

## 2020-02-10 NOTE — Telephone Encounter (Signed)
Zack paramedic was out visiting patient at his home and he reports that patients BP was 96/40. Per Robbie Lis, PA patient should hold pm dose of carvedilol and then tomorrow 02/11/20 should start taking carvedilol 3.125mg  bid. Zack advised,new Rx sent into patients pharmacy.   Meds ordered this encounter  Medications  . carvedilol (COREG) 3.125 MG tablet    Sig: Take 1 tablet (3.125 mg total) by mouth 2 (two) times daily with a meal.    Dispense:  180 tablet    Refill:  3    Please cancel all previous orders for current medication. Change in dosage or pill size.

## 2020-02-18 ENCOUNTER — Other Ambulatory Visit (HOSPITAL_COMMUNITY): Payer: Self-pay

## 2020-02-18 ENCOUNTER — Other Ambulatory Visit (HOSPITAL_COMMUNITY): Payer: Self-pay | Admitting: Internal Medicine

## 2020-02-18 NOTE — Progress Notes (Signed)
Paramedicine Encounter    Patient ID: Jeremy Powers, male    DOB: Apr 01, 1956, 64 y.o.   MRN: [id number]   Patient Care Team: [name] as PCP - General (Internal Medicine)  Patient Active Problem List   Diagnosis Date Noted  . Acute systolic (congestive) heart failure (HCC) 06/24/2018  . Aortic insufficiency 06/22/2018  . Acute on chronic congestive heart failure (HCC) 06/22/2018  . Elevated troponin 06/22/2018  . CAD (coronary artery disease) 06/22/2018  . Left bundle branch block 06/22/2018  . History of iron deficiency anemia 06/22/2018  . Acute on chronic heart failure (HCC) 06/22/2018    Current Outpatient Medications:  .  albuterol (VENTOLIN HFA) 108 (90 Base) MCG/ACT inhaler, Inhale 2 puffs into the lungs every 6 (six) hours as needed for wheezing or shortness of breath., Disp: 18 g, Rfl: 1 .  aspirin EC 81 MG tablet, Take 81 mg by mouth daily., Disp: , Rfl:  .  atorvastatin (LIPITOR) 40 MG tablet, TAKE 1 TABLET BY MOUTH DAILY AT 6 PM, Disp: 30 tablet, Rfl: 5 .  carvedilol (COREG) 3.125 MG tablet, Take 1 tablet (3.125 mg total) by mouth 2 (two) times daily with a meal., Disp: 180 tablet, Rfl: 3 .  digoxin (LANOXIN) 0.125 MG tablet, Take 1 tablet by mouth daily, Disp: 90 tablet, Rfl: 2 .  ferrous sulfate 325 (65 FE) MG tablet, Take 1 tablet (325 mg total) by mouth 2 (two) times daily with a meal., Disp: 180 tablet, Rfl: 3 .  furosemide (LASIX) 40 MG tablet, Take 1 tablet (40 mg total) by mouth daily. As needed, please call for office visit [phone], Disp: 30 tablet, Rfl: 3 .  sacubitril-valsartan (ENTRESTO) 49-51 MG, Take 1 tablet by mouth 2 (two) times daily., Disp: 60 tablet, Rfl: 11 .  spironolactone (ALDACTONE) 25 MG tablet, TAKE 1 TABLET BY MOUTH EACH NIGHT AT BEDTIME, Disp: 30 tablet, Rfl: 2 No Known Allergies    Social History   Socioeconomic History  . Marital status: Single    Spouse name: Not on file  . Number of children: Not on file  . Years of  education: Not on file  . Highest education level: Not on file  Occupational History  . Not on file  Tobacco Use  . Smoking status: Former Smoker    Packs/day: 0.50    Types: Cigarettes  . Smokeless tobacco: Never Used  Vaping Use  . Vaping Use: Never used  Substance and Sexual Activity  . Alcohol use: Yes    Comment: daily  . Drug use: Yes    Types: Marijuana  . Sexual activity: Not on file  Other Topics Concern  . Not on file  Social History Narrative  . Not on file   Social Determinants of Health   Financial Resource Strain:   . Difficulty of Paying Living Expenses: Not on file  Food Insecurity:   . Worried About Programme researcher, broadcasting/film/video in the Last Year: Not on file  . Ran Out of Food in the Last Year: Not on file  Transportation Needs:   . Lack of Transportation (Medical): Not on file  . Lack of Transportation (Non-Medical): Not on file  Physical Activity:   . Days of Exercise per Week: Not on file  . Minutes of Exercise per Session: Not on file  Stress:   . Feeling of Stress : Not on file  Social Connections:   . Frequency of Communication with Friends and Family: Not on file  .  Frequency of Social Gatherings with Friends and Family: Not on file  . Attends Religious Services: Not on file  . Active Member of Clubs or Organizations: Not on file  . Attends Banker Meetings: Not on file  . Marital Status: Not on file  Intimate Partner Violence:   . Fear of Current or Ex-Partner: Not on file  . Emotionally Abused: Not on file  . Physically Abused: Not on file  . Sexually Abused: Not on file    Physical Exam Cardiovascular:     Rate and Rhythm: Normal rate and regular rhythm.     Pulses: Normal pulses.  Pulmonary:     Effort: Pulmonary effort is normal.  Musculoskeletal:        General: Normal range of motion.     Right lower leg: No edema.     Left lower leg: No edema.  Skin:    General: Skin is warm and dry.     Capillary Refill: Capillary  refill takes less than 2 seconds.  Neurological:     Mental Status: He is alert and oriented to person, place, and time.  Psychiatric:        Mood and Affect: Mood normal.         No future appointments.  BP (!) 115/58 (BP Location: Left Arm, Patient Position: Sitting, Cuff Size: Normal)   Pulse 60   Resp 16   Wt 119 lb 6.4 oz (54.2 kg)   SpO2 98%   BMI 19.27 kg/m   Weight yesterday- 118.6 lb Last visit weight- 121 lb  [name] was seen at home today and reported feeling well. He denied chest pain, SOB, headache, dizziness, orthopnea, fever or cough over the past week. He stated he has been compliant with his medications over the past week and his weight has been stable. His medications were verified and his pillbox was refilled I will follow up next week.   Jacqualine Code, EMT 02/18/20  ACTION: Home visit completed Next visit planned for 1 week

## 2020-02-24 ENCOUNTER — Telehealth (HOSPITAL_COMMUNITY): Payer: Self-pay | Admitting: *Deleted

## 2020-02-24 ENCOUNTER — Other Ambulatory Visit (HOSPITAL_COMMUNITY): Payer: Self-pay

## 2020-02-24 NOTE — Progress Notes (Signed)
Paramedicine Encounter    Patient ID: Jeremy Powers, male    DOB: 1956/03/12, 64 y.o.   MRN: [id number]   Patient Care Team: [name] as PCP - General (Internal Medicine)  Patient Active Problem List   Diagnosis Date Noted  . Acute systolic (congestive) heart failure (HCC) 06/24/2018  . Aortic insufficiency 06/22/2018  . Acute on chronic congestive heart failure (HCC) 06/22/2018  . Elevated troponin 06/22/2018  . CAD (coronary artery disease) 06/22/2018  . Left bundle branch block 06/22/2018  . History of iron deficiency anemia 06/22/2018  . Acute on chronic heart failure (HCC) 06/22/2018    Current Outpatient Medications:  .  albuterol (VENTOLIN HFA) 108 (90 Base) MCG/ACT inhaler, Inhale 2 puffs into the lungs every 6 (six) hours as needed for wheezing or shortness of breath., Disp: 18 g, Rfl: 1 .  aspirin EC 81 MG tablet, Take 81 mg by mouth daily., Disp: , Rfl:  .  atorvastatin (LIPITOR) 40 MG tablet, TAKE 1 TABLET BY MOUTH DAILY AT 6 PM, Disp: 30 tablet, Rfl: 5 .  carvedilol (COREG) 3.125 MG tablet, Take 1 tablet (3.125 mg total) by mouth 2 (two) times daily with a meal., Disp: 180 tablet, Rfl: 3 .  digoxin (LANOXIN) 0.125 MG tablet, TAKE 1 TABLET BY MOUTH DAILY, Disp: 90 tablet, Rfl: 2 .  ferrous sulfate 325 (65 FE) MG tablet, Take 1 tablet (325 mg total) by mouth 2 (two) times daily with a meal., Disp: 180 tablet, Rfl: 3 .  furosemide (LASIX) 40 MG tablet, Take 1 tablet (40 mg total) by mouth daily. As needed, please call for office visit [phone], Disp: 30 tablet, Rfl: 3 .  sacubitril-valsartan (ENTRESTO) 49-51 MG, Take 1 tablet by mouth 2 (two) times daily., Disp: 60 tablet, Rfl: 11 .  spironolactone (ALDACTONE) 25 MG tablet, TAKE 1 TABLET BY MOUTH EACH NIGHT AT BEDTIME, Disp: 30 tablet, Rfl: 2 No Known Allergies    Social History   Socioeconomic History  . Marital status: Single    Spouse name: Not on file  . Number of children: Not on file  . Years of  education: Not on file  . Highest education level: Not on file  Occupational History  . Not on file  Tobacco Use  . Smoking status: Former Smoker    Packs/day: 0.50    Types: Cigarettes  . Smokeless tobacco: Never Used  Vaping Use  . Vaping Use: Never used  Substance and Sexual Activity  . Alcohol use: Yes    Comment: daily  . Drug use: Yes    Types: Marijuana  . Sexual activity: Not on file  Other Topics Concern  . Not on file  Social History Narrative  . Not on file   Social Determinants of Health   Financial Resource Strain:   . Difficulty of Paying Living Expenses: Not on file  Food Insecurity:   . Worried About Programme researcher, broadcasting/film/video in the Last Year: Not on file  . Ran Out of Food in the Last Year: Not on file  Transportation Needs:   . Lack of Transportation (Medical): Not on file  . Lack of Transportation (Non-Medical): Not on file  Physical Activity:   . Days of Exercise per Week: Not on file  . Minutes of Exercise per Session: Not on file  Stress:   . Feeling of Stress : Not on file  Social Connections:   . Frequency of Communication with Friends and Family: Not on file  .  Frequency of Social Gatherings with Friends and Family: Not on file  . Attends Religious Services: Not on file  . Active Member of Clubs or Organizations: Not on file  . Attends Banker Meetings: Not on file  . Marital Status: Not on file  Intimate Partner Violence:   . Fear of Current or Ex-Partner: Not on file  . Emotionally Abused: Not on file  . Physically Abused: Not on file  . Sexually Abused: Not on file    Physical Exam Cardiovascular:     Rate and Rhythm: Normal rate and regular rhythm.     Pulses: Normal pulses.  Pulmonary:     Effort: Pulmonary effort is normal.     Breath sounds: Normal breath sounds.  Musculoskeletal:        General: Normal range of motion.     Right lower leg: No edema.     Left lower leg: No edema.  Skin:    General: Skin is warm and  dry.     Capillary Refill: Capillary refill takes less than 2 seconds.  Neurological:     Mental Status: He is alert and oriented to person, place, and time.  Psychiatric:        Mood and Affect: Mood normal.         No future appointments.  BP (!) 110/44 (BP Location: Left Arm, Patient Position: Sitting, Cuff Size: Normal)   Pulse 61   Resp 16   Wt 120 lb (54.4 kg)   SpO2 99%   BMI 19.37 kg/m   Weight yesterday- did not weigh Last visit weight- 119 lb  Mr Jeremy Powers was seen at home today and reported feeling well. He denied chest pain, SOB, headache, dizziness, orthopnea, cough over fever over the past week. He stated he has been compliant with his medications and his weight has been stable. Upon assessment he was found to have a diastolic pressure of 44 mmHg but he continued to deny any symptoms. I contacted the HF clinic and he was scheduled for an appointment at the clinic towards the end of the month. His medications were verified and his pillbox was refilled. I will follow up next week.   Jacqualine Code, EMT 02/24/20  ACTION: Home visit completed Next visit planned for 1 week

## 2020-02-24 NOTE — Telephone Encounter (Signed)
Zack with paramedicine called to report pts bp 110/44 patient is asymptomatic. Pt has not been seen in the office since 03/2019. I scheduled pt an office visit and told Zack I would follow up with a provider about blood pressure.  Routed to Amy Clegg,NP for advice

## 2020-02-24 NOTE — Telephone Encounter (Signed)
Continue current medications. Can add on next Wednesday to the APP schedule.   Atonya Templer NP-C  2:40 PM

## 2020-02-25 NOTE — Telephone Encounter (Signed)
Appt scheduled with patients caregiver. Pt did not want appt next Wednesday.

## 2020-03-02 ENCOUNTER — Other Ambulatory Visit (HOSPITAL_COMMUNITY): Payer: Self-pay | Admitting: *Deleted

## 2020-03-02 ENCOUNTER — Other Ambulatory Visit (HOSPITAL_COMMUNITY): Payer: Self-pay

## 2020-03-02 MED ORDER — SPIRONOLACTONE 25 MG PO TABS: ORAL_TABLET | ORAL | 2 refills | Status: DC

## 2020-03-02 NOTE — Progress Notes (Signed)
Paramedicine Encounter    Patient ID: Jeremy Powers, male    DOB: 30-Aug-1955, 64 y.o.   MRN: [id number]   Patient Care Team: [name] as PCP - General (Internal Medicine)  Patient Active Problem List   Diagnosis Date Noted  . Acute systolic (congestive) heart failure (HCC) 06/24/2018  . Aortic insufficiency 06/22/2018  . Acute on chronic congestive heart failure (HCC) 06/22/2018  . Elevated troponin 06/22/2018  . CAD (coronary artery disease) 06/22/2018  . Left bundle branch block 06/22/2018  . History of iron deficiency anemia 06/22/2018  . Acute on chronic heart failure (HCC) 06/22/2018    Current Outpatient Medications:  .  albuterol (VENTOLIN HFA) 108 (90 Base) MCG/ACT inhaler, Inhale 2 puffs into the lungs every 6 (six) hours as needed for wheezing or shortness of breath., Disp: 18 g, Rfl: 1 .  aspirin EC 81 MG tablet, Take 81 mg by mouth daily., Disp: , Rfl:  .  atorvastatin (LIPITOR) 40 MG tablet, TAKE 1 TABLET BY MOUTH DAILY AT 6 PM, Disp: 30 tablet, Rfl: 5 .  carvedilol (COREG) 3.125 MG tablet, Take 1 tablet (3.125 mg total) by mouth 2 (two) times daily with a meal., Disp: 180 tablet, Rfl: 3 .  digoxin (LANOXIN) 0.125 MG tablet, TAKE 1 TABLET BY MOUTH DAILY, Disp: 90 tablet, Rfl: 2 .  ferrous sulfate 325 (65 FE) MG tablet, Take 1 tablet (325 mg total) by mouth 2 (two) times daily with a meal., Disp: 180 tablet, Rfl: 3 .  furosemide (LASIX) 40 MG tablet, Take 1 tablet (40 mg total) by mouth daily. As needed, please call for office visit [phone] (Patient not taking: Reported on 02/24/2020), Disp: 30 tablet, Rfl: 3 .  sacubitril-valsartan (ENTRESTO) 49-51 MG, Take 1 tablet by mouth 2 (two) times daily., Disp: 60 tablet, Rfl: 11 .  spironolactone (ALDACTONE) 25 MG tablet, TAKE 1 TABLET BY MOUTH EACH NIGHT AT BEDTIME, Disp: 30 tablet, Rfl: 2 No Known Allergies    Social History   Socioeconomic History  . Marital status: Single    Spouse name: Not on file  .  Number of children: Not on file  . Years of education: Not on file  . Highest education level: Not on file  Occupational History  . Not on file  Tobacco Use  . Smoking status: Former Smoker    Packs/day: 0.50    Types: Cigarettes  . Smokeless tobacco: Never Used  Vaping Use  . Vaping Use: Never used  Substance and Sexual Activity  . Alcohol use: Yes    Comment: daily  . Drug use: Yes    Types: Marijuana  . Sexual activity: Not on file  Other Topics Concern  . Not on file  Social History Narrative  . Not on file   Social Determinants of Health   Financial Resource Strain:   . Difficulty of Paying Living Expenses: Not on file  Food Insecurity:   . Worried About Programme researcher, broadcasting/film/video in the Last Year: Not on file  . Ran Out of Food in the Last Year: Not on file  Transportation Needs:   . Lack of Transportation (Medical): Not on file  . Lack of Transportation (Non-Medical): Not on file  Physical Activity:   . Days of Exercise per Week: Not on file  . Minutes of Exercise per Session: Not on file  Stress:   . Feeling of Stress : Not on file  Social Connections:   . Frequency of Communication with Friends and  Family: Not on file  . Frequency of Social Gatherings with Friends and Family: Not on file  . Attends Religious Services: Not on file  . Active Member of Clubs or Organizations: Not on file  . Attends Banker Meetings: Not on file  . Marital Status: Not on file  Intimate Partner Violence:   . Fear of Current or Ex-Partner: Not on file  . Emotionally Abused: Not on file  . Physically Abused: Not on file  . Sexually Abused: Not on file    Physical Exam Cardiovascular:     Rate and Rhythm: Normal rate and regular rhythm.     Pulses: Normal pulses.  Pulmonary:     Effort: Pulmonary effort is normal.     Breath sounds: Normal breath sounds.  Musculoskeletal:        General: Normal range of motion.     Right lower leg: No edema.     Left lower leg: No  edema.  Skin:    General: Skin is warm and dry.     Capillary Refill: Capillary refill takes less than 2 seconds.  Neurological:     Mental Status: He is alert and oriented to person, place, and time.  Psychiatric:        Mood and Affect: Mood normal.         Future Appointments  Date Time Provider Department Center  03/14/2020  3:00 PM [facility] PA/NP [facility] None    BP (!) 137/55 (BP Location: Left Arm, Patient Position: Sitting, Cuff Size: Normal)   Pulse 70   Resp 16   Wt 119 lb 12.8 oz (54.3 kg)   SpO2 97%   BMI 19.34 kg/m   Weight yesterday- did not weigh Last visit weight- 120 lb  Jeremy Powers was seen at home today and reported feeling well. He denied chest pain, SOB, headache, dizziness, orthopnea, fever or cough over the past week. He stated he has been compliant with his medications and his weight has been stable. His medications were verified and his pillbox was refilled. I will follow up next week.   Jacqualine Code, EMT 03/02/20  ACTION: Home visit completed Next visit planned for 1 week

## 2020-03-09 ENCOUNTER — Other Ambulatory Visit (HOSPITAL_COMMUNITY): Payer: Self-pay

## 2020-03-09 NOTE — Progress Notes (Signed)
Paramedicine Encounter    Patient ID: Jeremy Powers, male    DOB: [birth year], 64 y.o.   MRN: [id number]   Patient Care Team: [name], Cordelia Poche as PCP - General (Internal Medicine)  Patient Active Problem List   Diagnosis Date Noted   Acute systolic (congestive) heart failure (HCC) 06/24/2018   Aortic insufficiency 06/22/2018   Acute on chronic congestive heart failure (HCC) 06/22/2018   Elevated troponin 06/22/2018   CAD (coronary artery disease) 06/22/2018   Left bundle branch block 06/22/2018   History of iron deficiency anemia 06/22/2018   Acute on chronic heart failure (HCC) 06/22/2018    Current Outpatient Medications:    albuterol (VENTOLIN HFA) 108 (90 Base) MCG/ACT inhaler, Inhale 2 puffs into the lungs every 6 (six) hours as needed for wheezing or shortness of breath., Disp: 18 g, Rfl: 1   aspirin EC 81 MG tablet, Take 81 mg by mouth daily., Disp: , Rfl:    atorvastatin (LIPITOR) 40 MG tablet, TAKE 1 TABLET BY MOUTH DAILY AT 6 PM, Disp: 30 tablet, Rfl: 5   carvedilol (COREG) 3.125 MG tablet, Take 1 tablet (3.125 mg total) by mouth 2 (two) times daily with a meal., Disp: 180 tablet, Rfl: 3   digoxin (LANOXIN) 0.125 MG tablet, TAKE 1 TABLET BY MOUTH DAILY, Disp: 90 tablet, Rfl: 2   ferrous sulfate 325 (65 FE) MG tablet, Take 1 tablet (325 mg total) by mouth 2 (two) times daily with a meal., Disp: 180 tablet, Rfl: 3   furosemide (LASIX) 40 MG tablet, Take 1 tablet (40 mg total) by mouth daily. As needed, please call for office visit [phone], Disp: 30 tablet, Rfl: 3   sacubitril-valsartan (ENTRESTO) 49-51 MG, Take 1 tablet by mouth 2 (two) times daily., Disp: 60 tablet, Rfl: 11   spironolactone (ALDACTONE) 25 MG tablet, TAKE 1 TABLET BY MOUTH EACH NIGHT AT BEDTIME, Disp: 30 tablet, Rfl: 2 No Known Allergies    Social History   Socioeconomic History   Marital status: Single    Spouse name: Not on file   Number of children: Not on file   Years of  education: Not on file   Highest education level: Not on file  Occupational History   Not on file  Tobacco Use   Smoking status: Former Smoker    Packs/day: 0.50    Types: Cigarettes   Smokeless tobacco: Never Used  Vaping Use   Vaping Use: Never used  Substance and Sexual Activity   Alcohol use: Yes    Comment: daily   Drug use: Yes    Types: Marijuana   Sexual activity: Not on file  Other Topics Concern   Not on file  Social History Narrative   Not on file   Social Determinants of Health   Financial Resource Strain:    Difficulty of Paying Living Expenses: Not on file  Food Insecurity:    Worried About Running Out of Food in the Last Year: Not on file   Ran Out of Food in the Last Year: Not on file  Transportation Needs:    Lack of Transportation (Medical): Not on file   Lack of Transportation (Non-Medical): Not on file  Physical Activity:    Days of Exercise per Week: Not on file   Minutes of Exercise per Session: Not on file  Stress:    Feeling of Stress : Not on file  Social Connections:    Frequency of Communication with Friends and Family: Not on file  Frequency of Social Gatherings with Friends and Family: Not on file   Attends Religious Services: Not on file   Active Member of Clubs or Organizations: Not on file   Attends Banker Meetings: Not on file   Marital Status: Not on file  Intimate Partner Violence:    Fear of Current or Ex-Partner: Not on file   Emotionally Abused: Not on file   Physically Abused: Not on file   Sexually Abused: Not on file    Physical Exam Cardiovascular:     Rate and Rhythm: Normal rate and regular rhythm.     Pulses: Normal pulses.  Pulmonary:     Effort: Pulmonary effort is normal.     Breath sounds: Normal breath sounds.  Musculoskeletal:        General: Normal range of motion.     Right lower leg: No edema.     Left lower leg: No edema.  Skin:    General: Skin is warm and  dry.     Capillary Refill: Capillary refill takes less than 2 seconds.  Neurological:     Mental Status: He is alert and oriented to person, place, and time.  Psychiatric:        Mood and Affect: Mood normal.         Future Appointments  Date Time Provider Department Center  03/14/2020  3:00 PM [facility] PA/NP [facility] None    BP 120/60 (BP Location: Left Arm, Patient Position: Sitting, Cuff Size: Normal)    Pulse 66    Resp 16    Wt 117 lb 12.8 oz (53.4 kg)    SpO2 99%    BMI 19.01 kg/m   Weight yesterday- 116 lb Last visit weight- 119 lb  Jeremy Powers was seen at home today and reported feeling well. He denied chest pain, SOB, headache, orthopnea, fever or orthopnea over the past week. He stated he continues to have intermittent dizziness when going from sitting to standing but stated it resolves quickly. He reported being compliant with his medications over the past week and his weight has been stable. His medications were verified and his pillbox was refilled. He has a clinic appointment next week and advised he would be able to get there without any trouble. I advised him to call me if he should need help getting a ride and he was agreeable. I will follow up next week.   Jacqualine Code, EMT 03/09/20  ACTION: Home visit completed Next visit planned for 1 week

## 2020-03-14 ENCOUNTER — Encounter (HOSPITAL_COMMUNITY): Payer: Medicare Other

## 2020-03-15 ENCOUNTER — Telehealth (HOSPITAL_COMMUNITY): Payer: Self-pay

## 2020-03-15 NOTE — Telephone Encounter (Signed)
I called [name] to schedule an appointment for tomorrow. He stated he would be available in the morning so we agreed to meet at 09:00.   Jacqualine Code, EMT 03/15/20

## 2020-03-16 ENCOUNTER — Other Ambulatory Visit (HOSPITAL_COMMUNITY): Payer: Self-pay

## 2020-03-16 NOTE — Progress Notes (Signed)
Paramedicine Encounter    Patient ID: Jeremy Powers, male    DOB: April 22, 1956, 64 y.o.   MRN: [id number]   Patient Care Team: Jeremy Powers as PCP - General (Internal Medicine)  Patient Active Problem List   Diagnosis Date Noted  . Acute systolic (congestive) heart failure (HCC) 06/24/2018  . Aortic insufficiency 06/22/2018  . Acute on chronic congestive heart failure (HCC) 06/22/2018  . Elevated troponin 06/22/2018  . CAD (coronary artery disease) 06/22/2018  . Left bundle branch block 06/22/2018  . History of iron deficiency anemia 06/22/2018  . Acute on chronic heart failure (HCC) 06/22/2018    Current Outpatient Medications:  .  albuterol (VENTOLIN HFA) 108 (90 Base) MCG/ACT inhaler, Inhale 2 puffs into the lungs every 6 (six) hours as needed for wheezing or shortness of breath., Disp: 18 g, Rfl: 1 .  aspirin EC 81 MG tablet, Take 81 mg by mouth daily., Disp: , Rfl:  .  atorvastatin (LIPITOR) 40 MG tablet, TAKE 1 TABLET BY MOUTH DAILY AT 6 PM, Disp: 30 tablet, Rfl: 5 .  carvedilol (COREG) 3.125 MG tablet, Take 1 tablet (3.125 mg total) by mouth 2 (two) times daily with a meal., Disp: 180 tablet, Rfl: 3 .  digoxin (LANOXIN) 0.125 MG tablet, TAKE 1 TABLET BY MOUTH DAILY, Disp: 90 tablet, Rfl: 2 .  ferrous sulfate 325 (65 FE) MG tablet, Take 1 tablet (325 mg total) by mouth 2 (two) times daily with a meal., Disp: 180 tablet, Rfl: 3 .  sacubitril-valsartan (ENTRESTO) 49-51 MG, Take 1 tablet by mouth 2 (two) times daily., Disp: 60 tablet, Rfl: 11 .  spironolactone (ALDACTONE) 25 MG tablet, TAKE 1 TABLET BY MOUTH EACH NIGHT AT BEDTIME, Disp: 30 tablet, Rfl: 2 .  furosemide (LASIX) 40 MG tablet, Take 1 tablet (40 mg total) by mouth daily. As needed, please call for office visit [phone] (Patient not taking: Reported on 03/09/2020), Disp: 30 tablet, Rfl: 3 No Known Allergies    Social History   Socioeconomic History  . Marital status: Single    Spouse name: Not on file  .  Number of children: Not on file  . Years of education: Not on file  . Highest education level: Not on file  Occupational History  . Not on file  Tobacco Use  . Smoking status: Former Smoker    Packs/day: 0.50    Types: Cigarettes  . Smokeless tobacco: Never Used  Vaping Use  . Vaping Use: Never used  Substance and Sexual Activity  . Alcohol use: Yes    Comment: daily  . Drug use: Yes    Types: Marijuana  . Sexual activity: Not on file  Other Topics Concern  . Not on file  Social History Narrative  . Not on file   Social Determinants of Health   Financial Resource Strain:   . Difficulty of Paying Living Expenses: Not on file  Food Insecurity:   . Worried About Programme researcher, broadcasting/film/video in the Last Year: Not on file  . Ran Out of Food in the Last Year: Not on file  Transportation Needs:   . Lack of Transportation (Medical): Not on file  . Lack of Transportation (Non-Medical): Not on file  Physical Activity:   . Days of Exercise per Week: Not on file  . Minutes of Exercise per Session: Not on file  Stress:   . Feeling of Stress : Not on file  Social Connections:   . Frequency of Communication with Friends and  Family: Not on file  . Frequency of Social Gatherings with Friends and Family: Not on file  . Attends Religious Services: Not on file  . Active Member of Clubs or Organizations: Not on file  . Attends Banker Meetings: Not on file  . Marital Status: Not on file  Intimate Partner Violence:   . Fear of Current or Ex-Partner: Not on file  . Emotionally Abused: Not on file  . Physically Abused: Not on file  . Sexually Abused: Not on file    Physical Exam Cardiovascular:     Rate and Rhythm: Normal rate and regular rhythm.     Pulses: Normal pulses.  Pulmonary:     Effort: Pulmonary effort is normal.     Breath sounds: Normal breath sounds.  Musculoskeletal:        General: Normal range of motion.     Right lower leg: No edema.     Left lower leg: No  edema.  Skin:    General: Skin is warm and dry.     Capillary Refill: Capillary refill takes less than 2 seconds.  Neurological:     Mental Status: He is alert and oriented to person, place, and time.  Psychiatric:        Mood and Affect: Mood normal.         No future appointments.  BP (!) 91/47 (BP Location: Left Arm, Patient Position: Sitting, Cuff Size: Normal)   Pulse 60   Resp 16   Wt 124 lb 9.6 oz (56.5 kg)   SpO2 99%   BMI 20.11 kg/m   Weight yesterday- 120.6 lb Last visit weight- 117 lb  Jeremy Powers was seen at home today and reported feeling well. He denied chest pain, SOB, headache, dizziness, orthopnea, fever or cough over the pats week. He reported being compliant with his medications and his weight has been stable. His medications were verified and his pillbox was refilled.   Jacqualine Code, EMT 03/16/20  ACTION: Home visit completed Next visit planned for 1 week

## 2020-03-23 ENCOUNTER — Other Ambulatory Visit (HOSPITAL_COMMUNITY): Payer: Self-pay

## 2020-03-23 NOTE — Progress Notes (Signed)
Paramedicine Encounter    Patient ID: Jeremy Powers, male    DOB: 1956/04/22, 64 y.o.   MRN: [id number]   Patient Care Team: [name], Cordelia Poche as PCP - General (Internal Medicine)  Patient Active Problem List   Diagnosis Date Noted   Acute systolic (congestive) heart failure (HCC) 06/24/2018   Aortic insufficiency 06/22/2018   Acute on chronic congestive heart failure (HCC) 06/22/2018   Elevated troponin 06/22/2018   CAD (coronary artery disease) 06/22/2018   Left bundle branch block 06/22/2018   History of iron deficiency anemia 06/22/2018   Acute on chronic heart failure (HCC) 06/22/2018    Current Outpatient Medications:    albuterol (VENTOLIN HFA) 108 (90 Base) MCG/ACT inhaler, Inhale 2 puffs into the lungs every 6 (six) hours as needed for wheezing or shortness of breath., Disp: 18 g, Rfl: 1   aspirin EC 81 MG tablet, Take 81 mg by mouth daily., Disp: , Rfl:    atorvastatin (LIPITOR) 40 MG tablet, TAKE 1 TABLET BY MOUTH DAILY AT 6 PM, Disp: 30 tablet, Rfl: 5   carvedilol (COREG) 3.125 MG tablet, Take 1 tablet (3.125 mg total) by mouth 2 (two) times daily with a meal., Disp: 180 tablet, Rfl: 3   digoxin (LANOXIN) 0.125 MG tablet, TAKE 1 TABLET BY MOUTH DAILY, Disp: 90 tablet, Rfl: 2   ferrous sulfate 325 (65 FE) MG tablet, Take 1 tablet (325 mg total) by mouth 2 (two) times daily with a meal., Disp: 180 tablet, Rfl: 3   furosemide (LASIX) 40 MG tablet, Take 1 tablet (40 mg total) by mouth daily. As needed, please call for office visit [phone] (Patient not taking: Reported on 03/09/2020), Disp: 30 tablet, Rfl: 3   sacubitril-valsartan (ENTRESTO) 49-51 MG, Take 1 tablet by mouth 2 (two) times daily., Disp: 60 tablet, Rfl: 11   spironolactone (ALDACTONE) 25 MG tablet, TAKE 1 TABLET BY MOUTH EACH NIGHT AT BEDTIME, Disp: 30 tablet, Rfl: 2 No Known Allergies    Social History   Socioeconomic History   Marital status: Single    Spouse name: Not on file    Number of children: Not on file   Years of education: Not on file   Highest education level: Not on file  Occupational History   Not on file  Tobacco Use   Smoking status: Former Smoker    Packs/day: 0.50    Types: Cigarettes   Smokeless tobacco: Never Used  Vaping Use   Vaping Use: Never used  Substance and Sexual Activity   Alcohol use: Yes    Comment: daily   Drug use: Yes    Types: Marijuana   Sexual activity: Not on file  Other Topics Concern   Not on file  Social History Narrative   Not on file   Social Determinants of Health   Financial Resource Strain:    Difficulty of Paying Living Expenses: Not on file  Food Insecurity:    Worried About Running Out of Food in the Last Year: Not on file   Ran Out of Food in the Last Year: Not on file  Transportation Needs:    Lack of Transportation (Medical): Not on file   Lack of Transportation (Non-Medical): Not on file  Physical Activity:    Days of Exercise per Week: Not on file   Minutes of Exercise per Session: Not on file  Stress:    Feeling of Stress : Not on file  Social Connections:    Frequency of Communication with Friends and  Family: Not on file   Frequency of Social Gatherings with Friends and Family: Not on file   Attends Religious Services: Not on file   Active Member of Clubs or Organizations: Not on file   Attends Banker Meetings: Not on file   Marital Status: Not on file  Intimate Partner Violence:    Fear of Current or Ex-Partner: Not on file   Emotionally Abused: Not on file   Physically Abused: Not on file   Sexually Abused: Not on file    Physical Exam Cardiovascular:     Rate and Rhythm: Normal rate and regular rhythm.     Pulses: Normal pulses.  Pulmonary:     Effort: Pulmonary effort is normal.     Breath sounds: Normal breath sounds.  Musculoskeletal:        General: Normal range of motion.     Right lower leg: No edema.     Left lower leg: No  edema.  Skin:    General: Skin is warm and dry.     Capillary Refill: Capillary refill takes less than 2 seconds.  Neurological:     Mental Status: He is alert and oriented to person, place, and time.  Psychiatric:        Mood and Affect: Mood normal.         Future Appointments  Date Time Provider Department Center  04/05/2020  9:00 AM [facility] PA/NP [facility] None    BP 120/72 (BP Location: Left Arm, Patient Position: Sitting, Cuff Size: Normal)    Pulse 65    Resp 16    Wt 125 lb (56.7 kg)    SpO2 99%    BMI 20.18 kg/m   Weight yesterday- 121 lb Last visit weight- 124 lb  Mr Nicklaus was seen at home today and reported feeling well. He denied chest pain, SOB, headache fever or cough over the past week. He stated he has been compliant with his medications and his weight has been stable. His medications were verified and his pillbox was refilled. I will follow up next week.     Jacqualine Code, EMT 03/23/20  ACTION: Home visit completed Next visit planned for 1 week

## 2020-03-30 ENCOUNTER — Other Ambulatory Visit (HOSPITAL_COMMUNITY): Payer: Self-pay | Admitting: Internal Medicine

## 2020-03-30 ENCOUNTER — Other Ambulatory Visit (HOSPITAL_COMMUNITY): Payer: Self-pay

## 2020-03-30 NOTE — Progress Notes (Signed)
Paramedicine Encounter    Patient ID: Jeremy Powers, male    DOB: 1955-12-05, 64 y.o.   MRN: [id number]   Patient Care Team: Jeremy Powers as PCP - General (Internal Medicine)  Patient Active Problem List   Diagnosis Date Noted  . Acute systolic (congestive) heart failure (HCC) 06/24/2018  . Aortic insufficiency 06/22/2018  . Acute on chronic congestive heart failure (HCC) 06/22/2018  . Elevated troponin 06/22/2018  . CAD (coronary artery disease) 06/22/2018  . Left bundle branch block 06/22/2018  . History of iron deficiency anemia 06/22/2018  . Acute on chronic heart failure (HCC) 06/22/2018    Current Outpatient Medications:  .  albuterol (VENTOLIN HFA) 108 (90 Base) MCG/ACT inhaler, Inhale 2 puffs into the lungs every 6 (six) hours as needed for wheezing or shortness of breath., Disp: 18 g, Rfl: 1 .  aspirin EC 81 MG tablet, Take 81 mg by mouth daily., Disp: , Rfl:  .  atorvastatin (LIPITOR) 40 MG tablet, TAKE 1 TABLET BY MOUTH DAILY AT 6 PM, Disp: 30 tablet, Rfl: 5 .  carvedilol (COREG) 3.125 MG tablet, Take 1 tablet (3.125 mg total) by mouth 2 (two) times daily with a meal., Disp: 180 tablet, Rfl: 3 .  digoxin (LANOXIN) 0.125 MG tablet, TAKE 1 TABLET BY MOUTH DAILY, Disp: 90 tablet, Rfl: 2 .  ferrous sulfate 325 (65 FE) MG tablet, Take 1 tablet (325 mg total) by mouth 2 (two) times daily with a meal., Disp: 180 tablet, Rfl: 3 .  furosemide (LASIX) 40 MG tablet, Take 1 tablet (40 mg total) by mouth daily. As needed, please call for office visit [phone] (Patient not taking: Reported on 03/09/2020), Disp: 30 tablet, Rfl: 3 .  sacubitril-valsartan (ENTRESTO) 49-51 MG, Take 1 tablet by mouth 2 (two) times daily., Disp: 60 tablet, Rfl: 11 .  spironolactone (ALDACTONE) 25 MG tablet, TAKE 1 TABLET BY MOUTH EACH NIGHT AT BEDTIME, Disp: 30 tablet, Rfl: 2 No Known Allergies    Social History   Socioeconomic History  . Marital status: Single    Spouse name: Not on file  .  Number of children: Not on file  . Years of education: Not on file  . Highest education level: Not on file  Occupational History  . Not on file  Tobacco Use  . Smoking status: Former Smoker    Packs/day: 0.50    Types: Cigarettes  . Smokeless tobacco: Never Used  Vaping Use  . Vaping Use: Never used  Substance and Sexual Activity  . Alcohol use: Yes    Comment: daily  . Drug use: Yes    Types: Marijuana  . Sexual activity: Not on file  Other Topics Concern  . Not on file  Social History Narrative  . Not on file   Social Determinants of Health   Financial Resource Strain:   . Difficulty of Paying Living Expenses: Not on file  Food Insecurity:   . Worried About Programme researcher, broadcasting/film/video in the Last Year: Not on file  . Ran Out of Food in the Last Year: Not on file  Transportation Needs:   . Lack of Transportation (Medical): Not on file  . Lack of Transportation (Non-Medical): Not on file  Physical Activity:   . Days of Exercise per Week: Not on file  . Minutes of Exercise per Session: Not on file  Stress:   . Feeling of Stress : Not on file  Social Connections:   . Frequency of Communication with Friends and  Family: Not on file  . Frequency of Social Gatherings with Friends and Family: Not on file  . Attends Religious Services: Not on file  . Active Member of Clubs or Organizations: Not on file  . Attends Banker Meetings: Not on file  . Marital Status: Not on file  Intimate Partner Violence:   . Fear of Current or Ex-Partner: Not on file  . Emotionally Abused: Not on file  . Physically Abused: Not on file  . Sexually Abused: Not on file    Physical Exam Cardiovascular:     Rate and Rhythm: Regular rhythm.     Pulses: Normal pulses.  Pulmonary:     Effort: Pulmonary effort is normal.     Breath sounds: Normal breath sounds.  Abdominal:     General: Abdomen is flat. There is no distension.  Musculoskeletal:        General: Normal range of motion.      Right lower leg: No edema.     Left lower leg: No edema.  Skin:    General: Skin is warm and dry.     Capillary Refill: Capillary refill takes less than 2 seconds.  Neurological:     Mental Status: He is alert and oriented to person, place, and time.  Psychiatric:        Mood and Affect: Mood normal.         Future Appointments  Date Time Provider Department Center  04/05/2020  9:00 AM [facility] PA/NP [facility] None    BP 118/63 (BP Location: Left Arm, Patient Position: Sitting, Cuff Size: Normal)   Pulse 78   Resp 16   Wt 118 lb 6.4 oz (53.7 kg)   SpO2 99%   BMI 19.11 kg/m   Weight yesterday- 121 lb Last visit weight- 125 lb  [name] was seen at home today and reported feeling well. He denied chest pain, SOB, headache, dizziness. Orthopnea, fever or cough over the past week. He has been compliant with his medications and his weight remains stable. His medications were verified and his pillbox was refilled. I will follow up next week.   Jacqualine Code, EMT 03/30/20  ACTION: Home visit completed Next visit planned for 1 week

## 2020-04-04 NOTE — Progress Notes (Signed)
PCP: Primary Cardiologist: Dr [name]  HPI: Jeremy Powers is a 64 y.o. male with a history of CAD with NSTEMI, s/p PCI to LAD in 2015 and 2016, s/p CABG 2017, systolic HF EF 20-25%, microcytic/iron deficiency anemia followed by hematology and GI, LBBB, and tobacco use. Illiterate and cannot read.   In 2/20 he presented to [facility] ED with SOB, cough, and orthopnea in setting of med non-compliance. Was unable to afford copays with Medicaid. He was tachycardic and tachypneic on arrival, and was transferred to [facility]2/2/20for further evaluation. Echo performed which showed newly decreased EF of 25-30% from 50-55% in 12/2017. Taken for [facility] which showed severe 1 v disease with occluded LAD within previous stent. RCA and LCX normal. LIMA to LAD and SVG to D2 patent. Cath also showed low filling pressures with moderately low cardiac output. Meds adjusted as tolerated.  Today he returns for HF follow up. He has not been seen in > 1 year. Overall feeling fine. Occasionally short of breath. Able to walk a few blocks. Denies PND/Orthopnea. No chest pain. Appetite ok. No fever or chills. He does not weigh at home.  Taking all medications. Smokes marijuana when he has it. Lives with his girlfriend. Followed by HF Paramedicine.  Echo 01/2019 EF 20-25%  Echo: 06/2018 EF 20-25%   RHC/LHC 06/24/2018 RA = 1 RV = 19/3 PA = 24/5 (14) PCW = 7 Fick cardiac output/index = 4.0/2.5 PVR = 1.9 WU Ao sat = 95% PA sat = 68%, 71% Assessment: 1. Severe 1-vessel CAD with occluded LAD within previous stent 2. Normal RCA and LCX 3. LIMA to LAD widely patent 4. SVG - D2 widely patent 5. SVG - D1 likely occluded (grafts not marked)  CMRI 06/27/18 EF 24%. Normal RV   ROS: All systems negative except as listed in HPI, PMH and Problem List.  SH:  Social History   Socioeconomic History  . Marital status: Single    Spouse name: Not on file  . Number of children: Not on file  . Years of education: Not on file  .  Highest education level: Not on file  Occupational History  . Not on file  Tobacco Use  . Smoking status: Former Smoker    Packs/day: 0.50    Types: Cigarettes  . Smokeless tobacco: Never Used  Vaping Use  . Vaping Use: Never used  Substance and Sexual Activity  . Alcohol use: Yes    Comment: daily  . Drug use: Yes    Types: Marijuana  . Sexual activity: Not on file  Other Topics Concern  . Not on file  Social History Narrative  . Not on file   Social Determinants of Health   Financial Resource Strain:   . Difficulty of Paying Living Expenses: Not on file  Food Insecurity:   . Worried About Programme researcher, broadcasting/film/video in the Last Year: Not on file  . Ran Out of Food in the Last Year: Not on file  Transportation Needs:   . Lack of Transportation (Medical): Not on file  . Lack of Transportation (Non-Medical): Not on file  Physical Activity:   . Days of Exercise per Week: Not on file  . Minutes of Exercise per Session: Not on file  Stress:   . Feeling of Stress : Not on file  Social Connections:   . Frequency of Communication with Friends and Family: Not on file  . Frequency of Social Gatherings with Friends and Family: Not on file  . Attends  Religious Services: Not on file  . Active Member of Clubs or Organizations: Not on file  . Attends Banker Meetings: Not on file  . Marital Status: Not on file  Intimate Partner Violence:   . Fear of Current or Ex-Partner: Not on file  . Emotionally Abused: Not on file  . Physically Abused: Not on file  . Sexually Abused: Not on file    FH: No family history on file.  Past Medical History:  Diagnosis Date  . Cirrhosis (HCC)   . Coronary artery disease   . Hypertension   . Pancreatitis     Current Outpatient Medications  Medication Sig Dispense Refill  . albuterol (VENTOLIN HFA) 108 (90 Base) MCG/ACT inhaler Inhale 2 puffs into the lungs every 6 (six) hours as needed for wheezing or shortness of breath. 18 g 1  .  aspirin EC 81 MG tablet Take 81 mg by mouth daily.    Marland Kitchen atorvastatin (LIPITOR) 40 MG tablet TAKE 1 TABLET BY MOUTH DAILY AT 6 PM 30 tablet 5  . carvedilol (COREG) 3.125 MG tablet Take 1 tablet (3.125 mg total) by mouth 2 (two) times daily with a meal. 180 tablet 3  . digoxin (LANOXIN) 0.125 MG tablet TAKE 1 TABLET BY MOUTH DAILY 90 tablet 2  . ferrous sulfate 325 (65 FE) MG tablet Take 1 tablet (325 mg total) by mouth 2 (two) times daily with a meal. 180 tablet 3  . furosemide (LASIX) 40 MG tablet Take 1 tablet (40 mg total) by mouth daily. As needed, please call for office visit [phone] 30 tablet 3  . sacubitril-valsartan (ENTRESTO) 49-51 MG Take 1 tablet by mouth 2 (two) times daily. 60 tablet 11  . spironolactone (ALDACTONE) 25 MG tablet TAKE 1 TABLET BY MOUTH EACH NIGHT AT BEDTIME 30 tablet 2   No current facility-administered medications for this encounter.    Vitals:   04/05/20 0903  BP: 119/67  Pulse: 81  SpO2: 98%  Weight: 55.9 kg (123 lb 3.2 oz)    Wt Readings from Last 3 Encounters:  04/05/20 55.9 kg (123 lb 3.2 oz)  03/30/20 53.7 kg (118 lb 6.4 oz)  03/23/20 56.7 kg (125 lb)     PHYSICAL EXAM: General:  Well appearing. No resp difficulty HEENT: normal Neck: supple. no JVD. Carotids 2+ bilat; no bruits. No lymphadenopathy or thryomegaly appreciated. Cor: PMI nondisplaced. Regular rate & rhythm. No rubs, gallops or murmurs. Lungs: clear Abdomen: soft, nontender, nondistended. No hepatosplenomegaly. No bruits or masses. Good bowel sounds. Extremities: no cyanosis, clubbing, rash, edema Neuro: alert & orientedx3, cranial nerves grossly intact. moves all 4 extremities w/o difficulty. Affect pleasant    ASSESSMENT & PLAN: 1.Chronic Systolic Heart Failure, NICM ECHO 06/2018 EF 25-30% CMRI 06/27/2018 with EF 24% Echo 01/2019 EF 20-25%  - NYHA III - Volume status stable. Stop lasix.  - Continue  carvedilol to 6.25 mg twice a day.  -Continue entresto 49-51 mg twice a  day  -Continue spironolactone to 25 mg dail at bed time.  - Start farxiga. Check BMET today and in 7 days. HF Paramedicine will check BMET  -Repeat ECHO in 3 months. ? Needs CRT-D    2. CAD - S/P CABG 2017, LHC 06/2018  with occluded LAD normal LCX and RCA. LIMA to LAD and SVG to D2 patent (SVG to D1 occluded) - No chest pain.  - Continue aspirin 81 mg daily +atorvastatin.     3. LBBB EKG QRS 182 ms  - ?  CRT-D placement.   4.  Tobacco Abuse No longer smoking.   5. HTN See above.   6. Illiterate Cannot read. Able to write number. Needs assistance with medications.   Discussed medication changes.   Follow up in 6 weeks with APP and 12 weeks Dr [name] and ECHO.  Jmarion Christiano NP-C  9:11 AM

## 2020-04-05 ENCOUNTER — Ambulatory Visit (HOSPITAL_COMMUNITY)
Admission: RE | Admit: 2020-04-05 | Discharge: 2020-04-05 | Disposition: A | Discharge status: Home / Self Care | Payer: Medicare Other | Source: Ambulatory Visit | Attending: Adult Health | Admitting: Adult Health

## 2020-04-05 ENCOUNTER — Other Ambulatory Visit: Payer: Self-pay

## 2020-04-05 ENCOUNTER — Telehealth (HOSPITAL_COMMUNITY): Payer: Self-pay

## 2020-04-05 VITALS — BP 119/67 | HR 81 | Wt 123.2 lb

## 2020-04-05 DIAGNOSIS — I10 Essential (primary) hypertension: Secondary | ICD-10-CM

## 2020-04-05 DIAGNOSIS — I428 Other cardiomyopathies: Secondary | ICD-10-CM | POA: Diagnosis not present

## 2020-04-05 DIAGNOSIS — I110 Hypertensive heart disease with heart failure: Secondary | ICD-10-CM | POA: Diagnosis not present

## 2020-04-05 DIAGNOSIS — I252 Old myocardial infarction: Secondary | ICD-10-CM | POA: Diagnosis not present

## 2020-04-05 DIAGNOSIS — D509 Iron deficiency anemia, unspecified: Secondary | ICD-10-CM | POA: Diagnosis not present

## 2020-04-05 DIAGNOSIS — Z79899 Other long term (current) drug therapy: Secondary | ICD-10-CM | POA: Insufficient documentation

## 2020-04-05 DIAGNOSIS — Z550 Illiteracy and low-level literacy: Secondary | ICD-10-CM | POA: Insufficient documentation

## 2020-04-05 DIAGNOSIS — I2510 Atherosclerotic heart disease of native coronary artery without angina pectoris: Secondary | ICD-10-CM | POA: Diagnosis not present

## 2020-04-05 DIAGNOSIS — I5022 Chronic systolic (congestive) heart failure: Secondary | ICD-10-CM | POA: Insufficient documentation

## 2020-04-05 DIAGNOSIS — I447 Left bundle-branch block, unspecified: Secondary | ICD-10-CM | POA: Diagnosis not present

## 2020-04-05 DIAGNOSIS — Z7982 Long term (current) use of aspirin: Secondary | ICD-10-CM | POA: Insufficient documentation

## 2020-04-05 DIAGNOSIS — Z955 Presence of coronary angioplasty implant and graft: Secondary | ICD-10-CM | POA: Diagnosis not present

## 2020-04-05 DIAGNOSIS — Z87891 Personal history of nicotine dependence: Secondary | ICD-10-CM | POA: Diagnosis not present

## 2020-04-05 DIAGNOSIS — Z951 Presence of aortocoronary bypass graft: Secondary | ICD-10-CM | POA: Diagnosis not present

## 2020-04-05 LAB — BASIC METABOLIC PANEL
Anion gap: 11 (ref 5–15)
BUN: <5 mg/dL — ABNORMAL LOW (ref 8–23)
CO2: 21 mmol/L — ABNORMAL LOW (ref 22–32)
Calcium: 8.3 mg/dL — ABNORMAL LOW (ref 8.9–10.3)
Chloride: 105 mmol/L (ref 98–111)
Creatinine, Ser: 0.92 mg/dL (ref 0.61–1.24)
GFR, Estimated: >60 mL/min (ref >60)
Glucose, Bld: 123 mg/dL — ABNORMAL HIGH (ref 70–99)
Potassium: 3.1 mmol/L — ABNORMAL LOW (ref 3.5–5.1)
Sodium: 137 mmol/L (ref 135–145)

## 2020-04-05 LAB — CBC
HCT: 27.8 % — ABNORMAL LOW (ref 39.0–52.0)
Hemoglobin: 9.5 g/dL — ABNORMAL LOW (ref 13.0–17.0)
MCH: 33.2 pg (ref 26.0–34.0)
MCHC: 34.2 g/dL (ref 30.0–36.0)
MCV: 97.2 fL (ref 80.0–100.0)
Platelets: 209 10*3/uL (ref 150–400)
RBC: 2.86 MIL/uL — ABNORMAL LOW (ref 4.22–5.81)
RDW: 15.2 % (ref 11.5–15.5)
WBC: 4.7 10*3/uL (ref 4.0–10.5)
nRBC: 0.4 % — ABNORMAL HIGH (ref 0.0–0.2)

## 2020-04-05 LAB — BRAIN NATRIURETIC PEPTIDE: B Natriuretic Peptide: 201.2 pg/mL — ABNORMAL HIGH (ref 0.0–100.0)

## 2020-04-05 MED ORDER — DAPAGLIFLOZIN PROPANEDIOL 10 MG PO TABS: 10.0000 mg | ORAL_TABLET | Freq: Every day | ORAL | 3 refills | Status: DC

## 2020-04-05 MED ORDER — POTASSIUM CHLORIDE CRYS ER 20 MEQ PO TBCR: 40.0000 meq | EXTENDED_RELEASE_TABLET | Freq: Every day | ORAL | 3 refills | Status: DC

## 2020-04-05 NOTE — Telephone Encounter (Signed)
-----   Message from Sherald Hess, NP sent at 04/05/2020 12:57 PM EST ----- BNP stable. K low. Start 40 meq potassium daily. Please call. Repeat BMET in 7 days.

## 2020-04-05 NOTE — Patient Instructions (Signed)
STOP Lasix  START Farxiga 10mg  (1 tablet) Daily  Labs done today, your results will be available in MyChart, we will contact you for abnormal readings.  Your physician recommends that you schedule a follow-up appointment in: 6 weeks with our APP clinic  Your physician recommends that you schedule a follow-up appointment in: 3 months with an echocardiogram  Your physician has requested that you have an echocardiogram. Echocardiography is a painless test that uses sound waves to create images of your heart. It provides your doctor with information about the size and shape of your heart and how well your heart's chambers and valves are working. This procedure takes approximately one hour. There are no restrictions for this procedure.   If you have any questions or concerns before your next appointment please send a message through [facility] or call our office at [phone].    TO LEAVE A MESSAGE FOR THE NURSE SELECT OPTION 2, PLEASE LEAVE A MESSAGE INCLUDING: . YOUR NAME . DATE OF BIRTH . CALL BACK NUMBER . REASON FOR CALL**this is important as we prioritize the call backs  YOU WILL RECEIVE A CALL BACK THE SAME DAY AS LONG AS YOU CALL BEFORE 4:00 PM

## 2020-04-05 NOTE — Addendum Note (Signed)
Encounter addended by: [name], CMA on: 04/05/2020 4:29 PM  Actions taken: Pharmacy for encounter modified, Order list changed

## 2020-04-05 NOTE — Telephone Encounter (Signed)
   Samara Snide, RN  04/05/2020 1:46 PM EST Back to Top    Patient advised and verbalized understanding. Med list updated and lab appt scheduled. Lab orders placed

## 2020-04-06 ENCOUNTER — Other Ambulatory Visit (HOSPITAL_COMMUNITY): Payer: Self-pay

## 2020-04-06 MED ORDER — SACUBITRIL-VALSARTAN 49-51 MG PO TABS: 1.0000 | ORAL_TABLET | Freq: Two times a day (BID) | ORAL | 11 refills | Status: DC

## 2020-04-06 NOTE — Progress Notes (Signed)
Jeremy Powers was seen at home today and reported feeling well. The purpose of today's visit was to deliver [place] and refill his pillbox. Upon filling his box I noted that he had only 4 days of entresto so I contacted his pharmacy. They advised the refill request had been rejected by the HF clinic so I contacted them and had a new one sent in. I will pick it up tomorrow and add it to his box.  Jacqualine Code, EMT 04/06/20

## 2020-04-07 ENCOUNTER — Other Ambulatory Visit (HOSPITAL_COMMUNITY): Payer: Self-pay

## 2020-04-07 NOTE — Progress Notes (Signed)
Mr Yepes was seen today to deliver entresto and finish filling his pillbox. Nothing further was needed at this time. I will follow up next week.   Jacqualine Code, EMT 04/07/20

## 2020-04-11 ENCOUNTER — Other Ambulatory Visit (HOSPITAL_COMMUNITY): Payer: Medicare Other

## 2020-04-13 ENCOUNTER — Other Ambulatory Visit (HOSPITAL_COMMUNITY): Payer: Self-pay

## 2020-04-13 NOTE — Progress Notes (Signed)
Paramedicine Encounter    Patient ID: Jeremy Powers, male    DOB: 05-03-1956, 64 y.o.   MRN: [id number]   Patient Care Team: [name] as PCP - General (Internal Medicine)  Patient Active Problem List   Diagnosis Date Noted  . Acute systolic (congestive) heart failure (HCC) 06/24/2018  . Aortic insufficiency 06/22/2018  . Acute on chronic congestive heart failure (HCC) 06/22/2018  . Elevated troponin 06/22/2018  . CAD (coronary artery disease) 06/22/2018  . Left bundle branch block 06/22/2018  . History of iron deficiency anemia 06/22/2018  . Acute on chronic heart failure (HCC) 06/22/2018    Current Outpatient Medications:  .  albuterol (VENTOLIN HFA) 108 (90 Base) MCG/ACT inhaler, Inhale 2 puffs into the lungs every 6 (six) hours as needed for wheezing or shortness of breath., Disp: 18 g, Rfl: 1 .  aspirin EC 81 MG tablet, Take 81 mg by mouth daily., Disp: , Rfl:  .  atorvastatin (LIPITOR) 40 MG tablet, TAKE 1 TABLET BY MOUTH DAILY AT 6 PM, Disp: 30 tablet, Rfl: 5 .  carvedilol (COREG) 3.125 MG tablet, Take 1 tablet (3.125 mg total) by mouth 2 (two) times daily with a meal., Disp: 180 tablet, Rfl: 3 .  dapagliflozin propanediol (FARXIGA) 10 MG TABS tablet, Take 1 tablet (10 mg total) by mouth daily before breakfast., Disp: 30 tablet, Rfl: 3 .  digoxin (LANOXIN) 0.125 MG tablet, TAKE 1 TABLET BY MOUTH DAILY, Disp: 90 tablet, Rfl: 2 .  ferrous sulfate 325 (65 FE) MG tablet, Take 1 tablet (325 mg total) by mouth 2 (two) times daily with a meal., Disp: 180 tablet, Rfl: 3 .  potassium chloride SA (KLOR-CON) 20 MEQ tablet, Take 2 tablets (40 mEq total) by mouth daily., Disp: 60 tablet, Rfl: 3 .  sacubitril-valsartan (ENTRESTO) 49-51 MG, Take 1 tablet by mouth 2 (two) times daily., Disp: 60 tablet, Rfl: 11 .  spironolactone (ALDACTONE) 25 MG tablet, TAKE 1 TABLET BY MOUTH EACH NIGHT AT BEDTIME, Disp: 30 tablet, Rfl: 2 No Known Allergies    Social History   Socioeconomic  History  . Marital status: Single    Spouse name: Not on file  . Number of children: Not on file  . Years of education: Not on file  . Highest education level: Not on file  Occupational History  . Not on file  Tobacco Use  . Smoking status: Former Smoker    Packs/day: 0.50    Types: Cigarettes  . Smokeless tobacco: Never Used  Vaping Use  . Vaping Use: Never used  Substance and Sexual Activity  . Alcohol use: Yes    Comment: daily  . Drug use: Yes    Types: Marijuana  . Sexual activity: Not on file  Other Topics Concern  . Not on file  Social History Narrative  . Not on file   Social Determinants of Health   Financial Resource Strain:   . Difficulty of Paying Living Expenses: Not on file  Food Insecurity:   . Worried About Programme researcher, broadcasting/film/video in the Last Year: Not on file  . Ran Out of Food in the Last Year: Not on file  Transportation Needs:   . Lack of Transportation (Medical): Not on file  . Lack of Transportation (Non-Medical): Not on file  Physical Activity:   . Days of Exercise per Week: Not on file  . Minutes of Exercise per Session: Not on file  Stress:   . Feeling of Stress : Not on  file  Social Connections:   . Frequency of Communication with Friends and Family: Not on file  . Frequency of Social Gatherings with Friends and Family: Not on file  . Attends Religious Services: Not on file  . Active Member of Clubs or Organizations: Not on file  . Attends Banker Meetings: Not on file  . Marital Status: Not on file  Intimate Partner Violence:   . Fear of Current or Ex-Partner: Not on file  . Emotionally Abused: Not on file  . Physically Abused: Not on file  . Sexually Abused: Not on file    Physical Exam Cardiovascular:     Rate and Rhythm: Normal rate and regular rhythm.     Pulses: Normal pulses.  Pulmonary:     Effort: Pulmonary effort is normal.     Breath sounds: Normal breath sounds.  Musculoskeletal:        General: Normal range  of motion.     Right lower leg: No edema.     Left lower leg: No edema.  Skin:    General: Skin is warm and dry.     Capillary Refill: Capillary refill takes less than 2 seconds.  Neurological:     Mental Status: He is alert and oriented to person, place, and time.  Psychiatric:        Mood and Affect: Mood normal.         Future Appointments  Date Time Provider Department Center  05/23/2020 10:00 AM [facility] PA/NP [facility] None  07/06/2020 10:00 AM [facility] ECHO OP 1 [facility]  07/06/2020 11:00 AM Bensimhon, Bevelyn Buckles, MD [facility] None    BP (!) 119/53 (BP Location: Left Arm, Patient Position: Sitting, Cuff Size: Normal)   Pulse 75   Resp 16   Wt 120 lb (54.4 kg)   SpO2 100%   BMI 19.37 kg/m   Weight yesterday- did not weigh  Last visit weight- 123 lb  Jeremy Powers was seen at home today and reported feeling well. He denied chest pain, SOB, headache, dizziness, orthopnea, fever or cough over the past week. He had missed several doses of medications over the past week but stated it was because he did not eat so he did not want to take his medications. When I pressed on this topic he stated he has not gone any days without eating so it remains unclear why he had not taken his medications. I stressed the importance of medications compliance and he expressed understanding and was agreeable. His medications were verified and his pillbox was refilled. I will follow up next week.   Jacqualine Code, EMT 04/13/20  ACTION: Home visit completed Next visit planned for 1 week

## 2020-04-20 ENCOUNTER — Other Ambulatory Visit (HOSPITAL_COMMUNITY): Payer: Self-pay | Admitting: *Deleted

## 2020-04-20 ENCOUNTER — Ambulatory Visit (HOSPITAL_COMMUNITY)
Admission: RE | Admit: 2020-04-20 | Discharge: 2020-04-20 | Disposition: A | Discharge status: Home / Self Care | Payer: Medicare Other | Source: Ambulatory Visit | Attending: Internal Medicine | Admitting: Internal Medicine

## 2020-04-20 ENCOUNTER — Other Ambulatory Visit (HOSPITAL_COMMUNITY): Payer: Self-pay

## 2020-04-20 ENCOUNTER — Telehealth (HOSPITAL_COMMUNITY): Payer: Self-pay | Admitting: Licensed Clinical Social Worker

## 2020-04-20 ENCOUNTER — Other Ambulatory Visit: Payer: Self-pay

## 2020-04-20 ENCOUNTER — Telehealth (HOSPITAL_COMMUNITY): Payer: Self-pay | Admitting: *Deleted

## 2020-04-20 DIAGNOSIS — I5022 Chronic systolic (congestive) heart failure: Secondary | ICD-10-CM | POA: Diagnosis present

## 2020-04-20 LAB — CBC
HCT: 29.7 % — ABNORMAL LOW (ref 39.0–52.0)
Hemoglobin: 9.7 g/dL — ABNORMAL LOW (ref 13.0–17.0)
MCH: 32.4 pg (ref 26.0–34.0)
MCHC: 32.7 g/dL (ref 30.0–36.0)
MCV: 99.3 fL (ref 80.0–100.0)
Platelets: 371 10*3/uL (ref 150–400)
RBC: 2.99 MIL/uL — ABNORMAL LOW (ref 4.22–5.81)
RDW: 15.9 % — ABNORMAL HIGH (ref 11.5–15.5)
WBC: 4.5 10*3/uL (ref 4.0–10.5)
nRBC: 0 % (ref 0.0–0.2)

## 2020-04-20 LAB — DIGOXIN LEVEL: Digoxin Level: 1.3 ng/mL (ref 1.0–2.0)

## 2020-04-20 LAB — BASIC METABOLIC PANEL
Anion gap: 10 (ref 5–15)
BUN: <5 mg/dL — ABNORMAL LOW (ref 8–23)
CO2: 23 mmol/L (ref 22–32)
Calcium: 7.9 mg/dL — ABNORMAL LOW (ref 8.9–10.3)
Chloride: 108 mmol/L (ref 98–111)
Creatinine, Ser: 0.77 mg/dL (ref 0.61–1.24)
GFR, Estimated: >60 mL/min (ref >60)
Glucose, Bld: 85 mg/dL (ref 70–99)
Potassium: 3 mmol/L — ABNORMAL LOW (ref 3.5–5.1)
Sodium: 141 mmol/L (ref 135–145)

## 2020-04-20 MED ORDER — POTASSIUM CHLORIDE CRYS ER 20 MEQ PO TBCR: 40.0000 meq | EXTENDED_RELEASE_TABLET | Freq: Every day | ORAL | 3 refills | Status: DC

## 2020-04-20 MED ORDER — ENTRESTO 24-26 MG PO TABS: 1.0000 | ORAL_TABLET | Freq: Two times a day (BID) | ORAL | 3 refills | Status: DC

## 2020-04-20 NOTE — Progress Notes (Signed)
Paramedicine Encounter    Patient ID: Jeremy Powers, male    DOB: [birth year], 64 y.o.   MRN: [id number]   Patient Care Team: [name], Cordelia Poche as PCP - General (Internal Medicine)  Patient Active Problem List   Diagnosis Date Noted   Acute systolic (congestive) heart failure (HCC) 06/24/2018   Aortic insufficiency 06/22/2018   Acute on chronic congestive heart failure (HCC) 06/22/2018   Elevated troponin 06/22/2018   CAD (coronary artery disease) 06/22/2018   Left bundle branch block 06/22/2018   History of iron deficiency anemia 06/22/2018   Acute on chronic heart failure (HCC) 06/22/2018    Current Outpatient Medications:    albuterol (VENTOLIN HFA) 108 (90 Base) MCG/ACT inhaler, Inhale 2 puffs into the lungs every 6 (six) hours as needed for wheezing or shortness of breath., Disp: 18 g, Rfl: 1   aspirin EC 81 MG tablet, Take 81 mg by mouth daily., Disp: , Rfl:    atorvastatin (LIPITOR) 40 MG tablet, TAKE 1 TABLET BY MOUTH DAILY AT 6 PM, Disp: 30 tablet, Rfl: 5   carvedilol (COREG) 3.125 MG tablet, Take 1 tablet (3.125 mg total) by mouth 2 (two) times daily with a meal., Disp: 180 tablet, Rfl: 3   dapagliflozin propanediol (FARXIGA) 10 MG TABS tablet, Take 1 tablet (10 mg total) by mouth daily before breakfast., Disp: 30 tablet, Rfl: 3   digoxin (LANOXIN) 0.125 MG tablet, TAKE 1 TABLET BY MOUTH DAILY, Disp: 90 tablet, Rfl: 2   ferrous sulfate 325 (65 FE) MG tablet, Take 1 tablet (325 mg total) by mouth 2 (two) times daily with a meal., Disp: 180 tablet, Rfl: 3   potassium chloride SA (KLOR-CON) 20 MEQ tablet, Take 2 tablets (40 mEq total) by mouth daily., Disp: 60 tablet, Rfl: 3   spironolactone (ALDACTONE) 25 MG tablet, TAKE 1 TABLET BY MOUTH EACH NIGHT AT BEDTIME, Disp: 30 tablet, Rfl: 2 No Known Allergies    Social History   Socioeconomic History   Marital status: Single    Spouse name: Not on file   Number of children: Not on file   Years of  education: Not on file   Highest education level: Not on file  Occupational History   Not on file  Tobacco Use   Smoking status: Former Smoker    Packs/day: 0.50    Types: Cigarettes   Smokeless tobacco: Never Used  Vaping Use   Vaping Use: Never used  Substance and Sexual Activity   Alcohol use: Yes    Comment: daily   Drug use: Yes    Types: Marijuana   Sexual activity: Not on file  Other Topics Concern   Not on file  Social History Narrative   Not on file   Social Determinants of Health   Financial Resource Strain:    Difficulty of Paying Living Expenses: Not on file  Food Insecurity:    Worried About Running Out of Food in the Last Year: Not on file   Ran Out of Food in the Last Year: Not on file  Transportation Needs:    Lack of Transportation (Medical): Not on file   Lack of Transportation (Non-Medical): Not on file  Physical Activity:    Days of Exercise per Week: Not on file   Minutes of Exercise per Session: Not on file  Stress:    Feeling of Stress : Not on file  Social Connections:    Frequency of Communication with Friends and Family: Not on file   Frequency  of Social Gatherings with Friends and Family: Not on file   Attends Religious Services: Not on file   Active Member of Clubs or Organizations: Not on file   Attends Banker Meetings: Not on file   Marital Status: Not on file  Intimate Partner Violence:    Fear of Current or Ex-Partner: Not on file   Emotionally Abused: Not on file   Physically Abused: Not on file   Sexually Abused: Not on file    Physical Exam Cardiovascular:     Rate and Rhythm: Normal rate and regular rhythm.     Pulses: Normal pulses.  Pulmonary:     Effort: Pulmonary effort is normal.     Breath sounds: Normal breath sounds.  Musculoskeletal:        General: Normal range of motion.     Right lower leg: No edema.     Left lower leg: No edema.  Skin:    General: Skin is warm and  dry.     Capillary Refill: Capillary refill takes less than 2 seconds.  Neurological:     Mental Status: He is alert and oriented to person, place, and time.  Psychiatric:        Mood and Affect: Mood normal.         Future Appointments  Date Time Provider Department Center  04/20/2020  2:15 PM [facility] LAB [facility] None  05/23/2020 10:00 AM [facility] PA/NP [facility] None  07/06/2020 10:00 AM [facility] E[facility] OP 1 [facility]  07/06/2020 11:00 AM Bensimhon, Bevelyn Buckles, MD [facility] None    BP (!) 82/42 (BP Location: Left Arm, Patient Position: Sitting, Cuff Size: Normal)    Pulse 68    Resp 16    Wt 122 lb 6.4 oz (55.5 kg)    SpO2 98%    BMI 19.76 kg/m   Weight yesterday- 119.6 lb Last visit weight- 120 lb  Jeremy Powers was seen at home today and reported feeling well. He denied chest pain, SOB, headache, dizziness, orthopnea, fever or cough over the pats week. He stated he has been compliant with his medications and his weight has been stable. Upon assessing vital signs I noted him to be hypotensive. I contacted the HF clinic and was advised to have him decrease Entresto to 24 mg-26 mg and get him in for labs today. I relayed this to Jeremy Powers and he was agreeable. Upon verifying his medications, he advised he had not yet gotten potassium from the pharmacy. Upon digging further I found that the Rx had been sent to the wrong pharmacy so I had the clinic resend it to the correct pharmacy. His pillbox was refilled and I will follow up tomorrow to pick up and deliver potassium.   Jacqualine Code, EMT 04/20/20  ACTION: Home visit completed Next visit planned for tomorrow

## 2020-04-20 NOTE — Telephone Encounter (Signed)
CSW informed by [employer] that pt needing to come in for labs today but doesn't have transportation.  CSW spoke with pt and explained [facility]- they will pick up pt around 1:30pm to bring to lab appt- pt aware and will receive call from [facility] to provide ride details.  Will continue to follow and assist as needed  Burna Sis, LCSW Clinical Social Worker [facility] Desk#: [phone] Cell#: [phone]

## 2020-04-20 NOTE — Telephone Encounter (Signed)
Zack with paramedicine called to report pts bp running 82/42 but pt is asymptomatic. Pt recently started farxiga. Per Brittainy Simmons,PA-C decrease entresto to 24/26mg  twice daily and get labs bmet, digoxin level, and cbc. Zack aware and lab appointment scheduled today,

## 2020-04-21 ENCOUNTER — Other Ambulatory Visit (HOSPITAL_COMMUNITY): Payer: Self-pay

## 2020-04-21 NOTE — Progress Notes (Signed)
Jeremy Powers was seen at home today and reported feeling well. The purpose of this visit was to deliver potassium and add it to his pillbox. Unfortunately his pillbox was in the car which someone had just taken to the store but e ensured me that someone would help him add it when they returned. I will follow up next week.   Jacqualine Code, EMT 04/21/20

## 2020-04-22 ENCOUNTER — Telehealth (HOSPITAL_COMMUNITY): Payer: Self-pay | Admitting: Cardiology

## 2020-04-22 ENCOUNTER — Other Ambulatory Visit (HOSPITAL_COMMUNITY): Payer: Self-pay

## 2020-04-22 ENCOUNTER — Telehealth (HOSPITAL_COMMUNITY): Payer: Self-pay

## 2020-04-22 DIAGNOSIS — I5022 Chronic systolic (congestive) heart failure: Secondary | ICD-10-CM

## 2020-04-22 MED ORDER — POTASSIUM CHLORIDE CRYS ER 20 MEQ PO TBCR
40.0000 meq | EXTENDED_RELEASE_TABLET | Freq: Every day | ORAL | 3 refills | Status: DC
Start: 2020-04-22 — End: 2020-05-02

## 2020-04-22 MED ORDER — POTASSIUM CHLORIDE CRYS ER 20 MEQ PO TBCR: 60.0000 meq | EXTENDED_RELEASE_TABLET | Freq: Every day | ORAL | 3 refills | Status: DC

## 2020-04-22 NOTE — Addendum Note (Signed)
Addended by: Modesta Messing on: 04/22/2020 10:12 AM   Modules accepted: Orders

## 2020-04-22 NOTE — Progress Notes (Signed)
Jeremy Powers was seen at home today to make revisions to his pillbox follow the results of his labs on Wednesday. Upon arriving I noted that Jeremy Powers had not taken any of his medications since I saw him on Wednesday. When asked about this he began to cry and stated he was unhappy and missing his late mother and son. We discussed the importance of taking his medications for his physical health and I scheduled an appointment with his PCP for Monday at 13:40 to begin working on his emotional health. Jeremy Powers was agreeable to restart his medications and stated he would have someone take him to the appointment on Monday. I confirmed the same with his girlfriend who was home. I will follow up next week.   Jacqualine Code, EMT 04/22/20

## 2020-04-22 NOTE — Telephone Encounter (Signed)
Received a call from Zack w/paramedicine stating pt started of K on wither 12/1 or 12/2 labs were drawn 12/1. Pt did not start of k on 11/17 as prescribed. Per Dinah Beers do not increase to continue and have repeat labs next week. Zack aware and agreeable with plan.

## 2020-04-22 NOTE — Telephone Encounter (Signed)
-----   Message from Allayne Butcher, [place] sent at 04/21/2020  4:54 PM EST ----- Dig level elevated. Stop Digoxin.   K low. Increase KCl to 60 mEq daily.   Repeat Dig level and BMP in a week

## 2020-04-22 NOTE — Telephone Encounter (Signed)
Created in error

## 2020-04-22 NOTE — Telephone Encounter (Signed)
[name], [facility]  04/22/2020 8:35 AM EST Back to Top    Pt aware and voiced understanding  Repeat labs 12/10   [name], [facility]  04/21/2020 5:16 PM EST     [phone] Pt reports he does not understanding enough to review med changes and or labs, would like instructions to be told to Jeremy Powers [phone] [facility] for Ms Jeremy Powers

## 2020-04-25 ENCOUNTER — Ambulatory Visit: Payer: Medicare Other | Admitting: Medical

## 2020-04-27 ENCOUNTER — Telehealth: Payer: Self-pay | Admitting: Medical

## 2020-04-27 ENCOUNTER — Other Ambulatory Visit (HOSPITAL_COMMUNITY): Payer: Self-pay | Admitting: Internal Medicine

## 2020-04-27 ENCOUNTER — Other Ambulatory Visit (HOSPITAL_COMMUNITY): Payer: Self-pay

## 2020-04-27 NOTE — Progress Notes (Signed)
Paramedicine Encounter    Patient ID: Jeremy Powers, male    DOB: 08/22/1955, 64 y.o.   MRN: [id number]   Patient Care Team: Jeremy Powers as PCP - General (Internal Medicine)  Patient Active Problem List   Diagnosis Date Noted  . Acute systolic (congestive) heart failure (HCC) 06/24/2018  . Aortic insufficiency 06/22/2018  . Acute on chronic congestive heart failure (HCC) 06/22/2018  . Elevated troponin 06/22/2018  . CAD (coronary artery disease) 06/22/2018  . Left bundle branch block 06/22/2018  . History of iron deficiency anemia 06/22/2018  . Acute on chronic heart failure (HCC) 06/22/2018    Current Outpatient Medications:  .  albuterol (VENTOLIN HFA) 108 (90 Base) MCG/ACT inhaler, Inhale 2 puffs into the lungs every 6 (six) hours as needed for wheezing or shortness of breath., Disp: 18 g, Rfl: 1 .  aspirin EC 81 MG tablet, Take 81 mg by mouth daily., Disp: , Rfl:  .  atorvastatin (LIPITOR) 40 MG tablet, TAKE 1 TABLET BY MOUTH DAILY AT 6 PM, Disp: 30 tablet, Rfl: 5 .  carvedilol (COREG) 3.125 MG tablet, Take 1 tablet (3.125 mg total) by mouth 2 (two) times daily with a meal., Disp: 180 tablet, Rfl: 3 .  dapagliflozin propanediol (FARXIGA) 10 MG TABS tablet, Take 1 tablet (10 mg total) by mouth daily before breakfast., Disp: 30 tablet, Rfl: 3 .  ferrous sulfate 325 (65 FE) MG tablet, Take 1 tablet (325 mg total) by mouth 2 (two) times daily with a meal., Disp: 180 tablet, Rfl: 3 .  potassium chloride SA (KLOR-CON) 20 MEQ tablet, Take 2 tablets (40 mEq total) by mouth daily., Disp: 60 tablet, Rfl: 3 .  sacubitril-valsartan (ENTRESTO) 24-26 MG, Take 1 tablet by mouth 2 (two) times daily., Disp: 60 tablet, Rfl: 3 .  spironolactone (ALDACTONE) 25 MG tablet, TAKE 1 TABLET BY MOUTH EACH NIGHT AT BEDTIME, Disp: 30 tablet, Rfl: 2 No Known Allergies    Social History   Socioeconomic History  . Marital status: Single    Spouse name: Not on file  . Number of children: Not on  file  . Years of education: Not on file  . Highest education level: Not on file  Occupational History  . Not on file  Tobacco Use  . Smoking status: Former Smoker    Packs/day: 0.50    Types: Cigarettes  . Smokeless tobacco: Never Used  Vaping Use  . Vaping Use: Never used  Substance and Sexual Activity  . Alcohol use: Yes    Comment: daily  . Drug use: Yes    Types: Marijuana  . Sexual activity: Not on file  Other Topics Concern  . Not on file  Social History Narrative  . Not on file   Social Determinants of Health   Financial Resource Strain:   . Difficulty of Paying Living Expenses: Not on file  Food Insecurity:   . Worried About Programme researcher, broadcasting/film/video in the Last Year: Not on file  . Ran Out of Food in the Last Year: Not on file  Transportation Needs:   . Lack of Transportation (Medical): Not on file  . Lack of Transportation (Non-Medical): Not on file  Physical Activity:   . Days of Exercise per Week: Not on file  . Minutes of Exercise per Session: Not on file  Stress:   . Feeling of Stress : Not on file  Social Connections:   . Frequency of Communication with Friends and Family: Not on file  .  Frequency of Social Gatherings with Friends and Family: Not on file  . Attends Religious Services: Not on file  . Active Member of Clubs or Organizations: Not on file  . Attends Banker Meetings: Not on file  . Marital Status: Not on file  Intimate Partner Violence:   . Fear of Current or Ex-Partner: Not on file  . Emotionally Abused: Not on file  . Physically Abused: Not on file  . Sexually Abused: Not on file    Physical Exam Cardiovascular:     Rate and Rhythm: Normal rate and regular rhythm.     Pulses: Normal pulses.  Pulmonary:     Effort: Pulmonary effort is normal.     Breath sounds: Normal breath sounds.  Musculoskeletal:        General: Normal range of motion.     Right lower leg: No edema.     Left lower leg: No edema.  Skin:    General:  Skin is warm and dry.     Capillary Refill: Capillary refill takes less than 2 seconds.  Neurological:     Mental Status: He is alert and oriented to person, place, and time.  Psychiatric:        Mood and Affect: Mood normal.         Future Appointments  Date Time Provider Department Center  04/29/2020  2:45 PM [facility] LAB [facility] None  05/23/2020 10:00 AM [facility] PA/NP [facility] None  07/06/2020 10:00 AM [facility] ECHO OP 1 [facility]  07/06/2020 11:00 AM Bensimhon, Bevelyn Buckles, MD [facility] None    BP 140/60 (BP Location: Left Arm, Patient Position: Sitting, Cuff Size: Normal)   Pulse 67   Resp 16   Wt 123 lb 9.6 oz (56.1 kg)   SpO2 100%   BMI 19.95 kg/m   Weight yesterday- 126.6 lb Last visit weight- 122.4 lb  Mr Jeremy Powers was seen at home today and reported feeling well. He denied chest pain, SOB, headache, dizziness, orthopnea, fever or cough over the pasty week. He has been compliant with his medications and his weight remains stable. He did not make it to his [facility] appointment on Monday to talk about his depression but we were able to get him rescheduled for tomorrow. I got him set up with [facility] for that appointment and Friday's lab appointment. His medications were verified and his pillbox was refilled. I will follow up next week.   Jacqualine Code, EMT 04/27/20  ACTION: Home visit completed Next visit planned for 1 week

## 2020-04-27 NOTE — Telephone Encounter (Signed)
   DAN SCEARCE DOB: 01-31-1956 MRN: [id number]   RIDER WAIVER AND RELEASE OF LIABILITY  For purposes of improving physical access to our facilities, [facility] is pleased to partner with third parties to provide [facility] patients or other authorized individuals the option of convenient, on-demand ground transportation services (the Chiropractor") through use of the technology service that enables users to request on-demand ground transportation from independent third-party providers.  By opting to use and accept these [employer], I, the undersigned, hereby agree on behalf of myself, and on behalf of any minor child using the [employer] for whom I am the parent or legal guardian, as follows:  1. Science writer provided to me are provided by independent third-party transportation providers who are not [employer] or employees and who are unaffiliated with [employer]. 2. [facility] is neither a transportation carrier nor a common or public carrier. 3. [facility] has no control over the quality or safety of the transportation that occurs as a result of the [employer]. 4. [facility] cannot guarantee that any third-party transportation provider will complete any arranged transportation service. 5. [facility] makes no representation, warranty, or guarantee regarding the reliability, timeliness, quality, safety, suitability, or availability of any of the Transport Services or that they will be error free. 6. I fully understand that traveling by vehicle involves risks and dangers of serious bodily injury, including permanent disability, paralysis, and death. I agree, on behalf of myself and on behalf of any minor child using the Transport Services for whom I am the parent or legal guardian, that the entire risk arising out of my use of the [employer] remains solely with me, to the maximum extent permitted under applicable law. 7. The [employer] are provided "as is" and "as available." [facility] disclaims all representations and warranties, express, implied or statutory, not expressly set out in these terms, including the implied warranties of merchantability and fitness for a particular purpose. 8. I hereby waive and release [facility], its agents, employees, officers, directors, representatives, insurers, attorneys, assigns, successors, subsidiaries, and affiliates from any and all past, present, or future claims, demands, liabilities, actions, causes of action, or suits of any kind directly or indirectly arising from acceptance and use of the [employer]. 9. I further waive and release [place] and its affiliates from all present and future liability and responsibility for any injury or death to persons or damages to property caused by or related to the use of the [employer]. 10. I have read this Waiver and Release of Liability, and I understand the terms used in it and their legal significance. This Waiver is freely and voluntarily given with the understanding that my right (as well as the right of any minor child for whom I am the parent or legal guardian using the [employer]) to legal recourse against [place] in connection with the [employer] is knowingly surrendered in return for use of these services.   I attest that I read the consent document to [name], gave Mr. Regala the opportunity to ask questions and answered the questions asked (if any). I affirm that [name] then provided consent for he's participation in this program.     Hessie Knows

## 2020-04-28 ENCOUNTER — Ambulatory Visit (INDEPENDENT_AMBULATORY_CARE_PROVIDER_SITE_OTHER): Payer: Medicare Other | Admitting: Medical

## 2020-04-28 ENCOUNTER — Other Ambulatory Visit: Payer: Self-pay

## 2020-04-28 ENCOUNTER — Encounter: Payer: Self-pay | Admitting: Medical

## 2020-04-28 VITALS — BP 134/80 | HR 74 | Resp 20 | Ht 66.0 in | Wt 129.0 lb

## 2020-04-28 DIAGNOSIS — F32A Depression, unspecified: Secondary | ICD-10-CM

## 2020-04-28 DIAGNOSIS — I2510 Atherosclerotic heart disease of native coronary artery without angina pectoris: Secondary | ICD-10-CM

## 2020-04-28 DIAGNOSIS — I5021 Acute systolic (congestive) heart failure: Secondary | ICD-10-CM | POA: Diagnosis not present

## 2020-04-28 DIAGNOSIS — E785 Hyperlipidemia, unspecified: Secondary | ICD-10-CM | POA: Diagnosis not present

## 2020-04-28 DIAGNOSIS — E119 Type 2 diabetes mellitus without complications: Secondary | ICD-10-CM

## 2020-04-28 DIAGNOSIS — I10 Essential (primary) hypertension: Secondary | ICD-10-CM

## 2020-04-28 DIAGNOSIS — Z7185 Encounter for immunization safety counseling: Secondary | ICD-10-CM

## 2020-04-28 LAB — LIPID PANEL
Cholesterol: 142 mg/dL (ref 0–200)
HDL: 91.1 mg/dL (ref >39.00)
LDL Cholesterol: 35 mg/dL (ref 0–99)
NonHDL: 50.67
Total CHOL/HDL Ratio: 2
Triglycerides: 77 mg/dL (ref 0.0–149.0)
VLDL: 15.4 mg/dL (ref 0.0–40.0)

## 2020-04-28 LAB — HEMOGLOBIN A1C: Hgb A1c MFr Bld: 4.6 % (ref 4.6–6.5)

## 2020-04-28 LAB — COMPREHENSIVE METABOLIC PANEL
ALT: 12 U/L (ref 0–53)
AST: 28 U/L (ref 0–37)
Albumin: 3.3 g/dL — ABNORMAL LOW (ref 3.5–5.2)
Alkaline Phosphatase: 66 U/L (ref 39–117)
BUN: 4 mg/dL — ABNORMAL LOW (ref 6–23)
CO2: 29 mEq/L (ref 19–32)
Calcium: 7.7 mg/dL — ABNORMAL LOW (ref 8.4–10.5)
Chloride: 105 mEq/L (ref 96–112)
Creatinine, Ser: 0.7 mg/dL (ref 0.40–1.50)
GFR: 96.15 mL/min (ref >60.00)
Glucose, Bld: 87 mg/dL (ref 70–99)
Potassium: 3.4 mEq/L — ABNORMAL LOW (ref 3.5–5.1)
Sodium: 139 mEq/L (ref 135–145)
Total Bilirubin: 0.9 mg/dL (ref 0.2–1.2)
Total Protein: 6.4 g/dL (ref 6.0–8.3)

## 2020-04-28 LAB — BRAIN NATRIURETIC PEPTIDE: Pro B Natriuretic peptide (BNP): 717 pg/mL — ABNORMAL HIGH (ref 0.0–100.0)

## 2020-04-28 MED ORDER — SERTRALINE HCL 25 MG PO TABS: 25.0000 mg | ORAL_TABLET | Freq: Every day | ORAL | 0 refills | Status: DC

## 2020-04-28 NOTE — Progress Notes (Signed)
Subjective:    Patient ID: Jeremy Powers, male    DOB: [birth year], 64 y.o.   MRN: [id number]  HPI  Pt in for first time.  Pt is retired. Pt used to detail cars. Pt walks some daily. Pt admits eating fried foods. Pt does drink 1-2 sodas a day. Pt nonsmoker. But did smoke in past. Stopped one year ago. Smoked since teenager about one pack a day. Pt drinks small glass of wine about once a week.  Pt has hx of high cholesterol, chf, and hx of mi/CAD.  Pt has some depression history. Pt states some family issues. Pt states mother of one of his daughter passed away. He just found out about that 2 days ago. Pt states one of his sons passed away 5 years ago. He states he by himself often. He is willing to take medication for depression.   Review of Systems  Constitutional: Negative for chills, fatigue and fever.  HENT: Negative for congestion, drooling, ear pain and nosebleeds.   Respiratory: Negative for cough, chest tightness, shortness of breath and wheezing.   Cardiovascular: Negative for chest pain and palpitations.  Gastrointestinal: Negative for abdominal pain, nausea and vomiting.  Genitourinary: Negative for dysuria.  Musculoskeletal: Negative for back pain.  Skin: Negative for rash.  Neurological: Negative for dizziness, syncope, weakness, numbness and headaches.  Hematological: Negative for adenopathy. Does not bruise/bleed easily.  Psychiatric/Behavioral: Positive for dysphoric mood. Negative for confusion and sleep disturbance. The patient is not nervous/anxious.     Past Medical History:  Diagnosis Date  . Cirrhosis (HCC)   . Coronary artery disease   . Hypertension   . Pancreatitis      Social History   Socioeconomic History  . Marital status: Single    Spouse name: Not on file  . Number of children: Not on file  . Years of education: Not on file  . Highest education level: Not on file  Occupational History  . Not on file  Tobacco Use  . Smoking status: Former  Smoker    Packs/day: 0.50    Types: Cigarettes  . Smokeless tobacco: Never Used  Vaping Use  . Vaping Use: Never used  Substance and Sexual Activity  . Alcohol use: Yes    Comment: daily  . Drug use: Yes    Types: Marijuana  . Sexual activity: Not on file  Other Topics Concern  . Not on file  Social History Narrative  . Not on file   Social Determinants of Health   Financial Resource Strain: Not on file  Food Insecurity: Not on file  Transportation Needs: Not on file  Physical Activity: Not on file  Stress: Not on file  Social Connections: Not on file  Intimate Partner Violence: Not on file    Past Surgical History:  Procedure Laterality Date  . CARDIAC SURGERY    . RIGHT/LEFT HEART CATH AND CORONARY/GRAFT ANGIOGRAPHY N/A 06/24/2018   Procedure: RIGHT/LEFT HEART CATH AND CORONARY/GRAFT ANGIOGRAPHY;  Surgeon: Dolores Patty, MD;  Location[facility];  Service: Cardiovascular;  Laterality: N/A;    No family history on file.  No Known Allergies  Current Outpatient Medications on File Prior to Visit  Medication Sig Dispense Refill  . albuterol (VENTOLIN HFA) 108 (90 Base) MCG/ACT inhaler Inhale 2 puffs into the lungs every 6 (six) hours as needed for wheezing or shortness of breath. 18 g 1  . aspirin EC 81 MG tablet Take 81 mg by mouth daily.    Marland Kitchen  atorvastatin (LIPITOR) 40 MG tablet TAKE 1 TABLET BY MOUTH DAILY AT 6PM 30 tablet 5  . carvedilol (COREG) 3.125 MG tablet Take 1 tablet (3.125 mg total) by mouth 2 (two) times daily with a meal. 180 tablet 3  . dapagliflozin propanediol (FARXIGA) 10 MG TABS tablet Take 1 tablet (10 mg total) by mouth daily before breakfast. 30 tablet 3  . ferrous sulfate 325 (65 FE) MG tablet Take 1 tablet (325 mg total) by mouth 2 (two) times daily with a meal. 180 tablet 3  . potassium chloride SA (KLOR-CON) 20 MEQ tablet Take 2 tablets (40 mEq total) by mouth daily. 60 tablet 3  . sacubitril-valsartan (ENTRESTO) 24-26 MG Take 1  tablet by mouth 2 (two) times daily. 60 tablet 3  . spironolactone (ALDACTONE) 25 MG tablet TAKE 1 TABLET BY MOUTH EACH NIGHT AT BEDTIME 30 tablet 2   No current facility-administered medications on file prior to visit.    BP (!) 142/63   Pulse 74   Resp 20   Ht 5\' 6"  (1.676 m)   Wt 129 lb (58.5 kg)   SpO2 100%   BMI 20.82 kg/m       Objective:   Physical Exam  General Mental Status- Alert. General Appearance- Not in acute distress.   Skin General: Color- Normal Color. Moisture- Normal Moisture.  Neck Carotid Arteries- Normal color. Moisture- Normal Moisture. No carotid bruits. No JVD.  Chest and Lung Exam Auscultation: Breath Sounds:-Normal.  Cardiovascular Auscultation:Rythm- Regular. Murmurs & Other Heart Sounds:Auscultation of the heart reveals- No Murmurs.  Abdomen Inspection:-Inspeection Normal. Palpation/Percussion:Note:No mass. Palpation and Percussion of the abdomen reveal- Non Tender, Non Distended + BS, no rebound or guarding.    Neurologic Cranial Nerve exam:- CN III-XII intact(No nystagmus), symmetric smile. Strength:- 5/5 equal and symmetric strength both upper and lower extremities.  Lower ext- no pedal edema.     Assessment & Plan:  History of CHF and appears clinically stable presently.  Continue Coreg, Entresto and spironolactone.  We will get BNP and CMP today.  History of high cholesterol.  Continue atorvastatin and get lipid panel today.  Hypertension.  Blood pressure moderately well controlled today.  Continue Coreg and diuretic.  Recent mild depression.  Patient does want to take low-dose depression medication.  Prescribed sertraline 25 mg to take daily.  On review of chart appears far farxiga on the med list.  So we will get 22-month sugar average test to evaluate for diabetes.  Vaccine counseling regarding benefit of Covid vaccine.  Patient declined vaccine presently.  Follow-up in 1 month or as needed.  2-month, PA-C

## 2020-04-28 NOTE — Patient Instructions (Signed)
History of CHF and appears clinically stable presently.  Continue Coreg, Entresto and spironolactone.  We will get BNP and CMP today.  History of high cholesterol.  Continue atorvastatin and get lipid panel today.  Hypertension.  Blood pressure moderately well controlled today.  Continue Coreg and diuretic.  Recent mild depression.  Patient does want to take low-dose depression medication.  Prescribed sertraline 25 mg to take daily.  On review of chart appears far farxiga on the med list.  So we will get 71-month sugar average test to evaluate for diabetes.  Vaccine counseling regarding benefit of Covid vaccine.  Patient declined vaccine presently.  Follow-up in 1 month or as needed.

## 2020-04-29 ENCOUNTER — Ambulatory Visit (HOSPITAL_COMMUNITY)
Admission: RE | Admit: 2020-04-29 | Discharge: 2020-04-29 | Disposition: A | Discharge status: Home / Self Care | Payer: Medicare Other | Source: Ambulatory Visit | Attending: Internal Medicine | Admitting: Internal Medicine

## 2020-04-29 DIAGNOSIS — I5022 Chronic systolic (congestive) heart failure: Secondary | ICD-10-CM | POA: Diagnosis present

## 2020-04-29 LAB — BASIC METABOLIC PANEL
Anion gap: 11 (ref 5–15)
BUN: <5 mg/dL — ABNORMAL LOW (ref 8–23)
CO2: 24 mmol/L (ref 22–32)
Calcium: 8 mg/dL — ABNORMAL LOW (ref 8.9–10.3)
Chloride: 106 mmol/L (ref 98–111)
Creatinine, Ser: 0.83 mg/dL (ref 0.61–1.24)
GFR, Estimated: >60 mL/min (ref >60)
Glucose, Bld: 106 mg/dL — ABNORMAL HIGH (ref 70–99)
Potassium: 3.2 mmol/L — ABNORMAL LOW (ref 3.5–5.1)
Sodium: 141 mmol/L (ref 135–145)

## 2020-05-02 ENCOUNTER — Telehealth (HOSPITAL_COMMUNITY): Payer: Self-pay | Admitting: Cardiology

## 2020-05-02 DIAGNOSIS — I5022 Chronic systolic (congestive) heart failure: Secondary | ICD-10-CM

## 2020-05-02 MED ORDER — POTASSIUM CHLORIDE CRYS ER 20 MEQ PO TBCR: 40.0000 meq | EXTENDED_RELEASE_TABLET | Freq: Two times a day (BID) | ORAL | 3 refills | Status: DC

## 2020-05-02 NOTE — Telephone Encounter (Signed)
Pt aware of lab results and medication changes voiced understanding. Message to Zack to update med pack repeat labs 12/21

## 2020-05-02 NOTE — Telephone Encounter (Signed)
-----   Message from Allayne Butcher, [place] sent at 05/02/2020 12:14 PM EST ----- K still low. He was instructed last BMP to increase KCl to 60 mEq daily. Just increase to 40 mEq BID. Repeat BMP again in 7 days. Also needs repeat dig level. Previously ordered but not drawn.

## 2020-05-04 ENCOUNTER — Telehealth (HOSPITAL_COMMUNITY): Payer: Self-pay

## 2020-05-04 ENCOUNTER — Other Ambulatory Visit (HOSPITAL_COMMUNITY): Payer: Self-pay

## 2020-05-04 DIAGNOSIS — I5022 Chronic systolic (congestive) heart failure: Secondary | ICD-10-CM

## 2020-05-04 NOTE — Progress Notes (Signed)
Paramedicine Encounter    Patient ID: VILAS EDGERLY, male    DOB: Jul 24, 1955, 64 y.o.   MRN: [id number]   Patient Care Team: [name] as PCP - General (Internal Medicine)  Patient Active Problem List   Diagnosis Date Noted  . Acute systolic (congestive) heart failure (HCC) 06/24/2018  . Aortic insufficiency 06/22/2018  . Acute on chronic congestive heart failure (HCC) 06/22/2018  . Elevated troponin 06/22/2018  . CAD (coronary artery disease) 06/22/2018  . Left bundle branch block 06/22/2018  . History of iron deficiency anemia 06/22/2018  . Acute on chronic heart failure (HCC) 06/22/2018    Current Outpatient Medications:  .  albuterol (VENTOLIN HFA) 108 (90 Base) MCG/ACT inhaler, Inhale 2 puffs into the lungs every 6 (six) hours as needed for wheezing or shortness of breath., Disp: 18 g, Rfl: 1 .  aspirin EC 81 MG tablet, Take 81 mg by mouth daily., Disp: , Rfl:  .  atorvastatin (LIPITOR) 40 MG tablet, TAKE 1 TABLET BY MOUTH DAILY AT 6PM, Disp: 30 tablet, Rfl: 5 .  carvedilol (COREG) 3.125 MG tablet, Take 1 tablet (3.125 mg total) by mouth 2 (two) times daily with a meal., Disp: 180 tablet, Rfl: 3 .  dapagliflozin propanediol (FARXIGA) 10 MG TABS tablet, Take 1 tablet (10 mg total) by mouth daily before breakfast., Disp: 30 tablet, Rfl: 3 .  ferrous sulfate 325 (65 FE) MG tablet, Take 1 tablet (325 mg total) by mouth 2 (two) times daily with a meal., Disp: 180 tablet, Rfl: 3 .  potassium chloride SA (KLOR-CON) 20 MEQ tablet, Take 2 tablets (40 mEq total) by mouth 2 (two) times daily., Disp: 60 tablet, Rfl: 3 .  sacubitril-valsartan (ENTRESTO) 24-26 MG, Take 1 tablet by mouth 2 (two) times daily., Disp: 60 tablet, Rfl: 3 .  sertraline (ZOLOFT) 25 MG tablet, Take 1 tablet (25 mg total) by mouth daily., Disp: 30 tablet, Rfl: 0 .  spironolactone (ALDACTONE) 25 MG tablet, TAKE 1 TABLET BY MOUTH EACH NIGHT AT BEDTIME, Disp: 30 tablet, Rfl: 2 No Known Allergies    Social  History   Socioeconomic History  . Marital status: Single    Spouse name: Not on file  . Number of children: Not on file  . Years of education: Not on file  . Highest education level: Not on file  Occupational History  . Not on file  Tobacco Use  . Smoking status: Former Smoker    Packs/day: 0.50    Types: Cigarettes  . Smokeless tobacco: Never Used  Vaping Use  . Vaping Use: Never used  Substance and Sexual Activity  . Alcohol use: Yes    Comment: daily  . Drug use: Yes    Types: Marijuana  . Sexual activity: Not on file  Other Topics Concern  . Not on file  Social History Narrative  . Not on file   Social Determinants of Health   Financial Resource Strain: Not on file  Food Insecurity: Not on file  Transportation Needs: Not on file  Physical Activity: Not on file  Stress: Not on file  Social Connections: Not on file  Intimate Partner Violence: Not on file    Physical Exam Cardiovascular:     Rate and Rhythm: Normal rate and regular rhythm.     Pulses: Normal pulses.  Pulmonary:     Effort: Pulmonary effort is normal.     Breath sounds: Normal breath sounds.  Musculoskeletal:        General: Normal  range of motion.     Right lower leg: Edema present.     Left lower leg: Edema present.  Skin:    General: Skin is warm and dry.     Capillary Refill: Capillary refill takes less than 2 seconds.  Neurological:     Mental Status: He is alert and oriented to person, place, and time.  Psychiatric:        Mood and Affect: Mood normal.         Future Appointments  Date Time Provider Department Center  05/10/2020 10:30 AM [facility] LAB [facility] None  05/23/2020 10:00 AM [facility] PA/NP [facility] None  05/31/2020  8:40 AM Saguier, Ramon Dredge, PA-C [facility]  07/06/2020 10:00 AM [facility] ECHO OP 1 [facility]  07/06/2020 11:00 AM Bensimhon, Bevelyn Buckles, MD [facility] None    BP 140/63 (BP Location: Left Arm, Patient Position: Sitting, Cuff Size: Normal)   Pulse 62   Resp 16    Wt 135 lb 9.6 oz (61.5 kg)   SpO2 98%   BMI 21.89 kg/m   Weight yesterday- 131.4 lb Last visit weight- 123.6 lb  Mr Rockers was seen at home today and reported feeling generally well. He denied chest pain, SOB, headache, dizziness, orthopnea, fever or cough over the past week. He reported being compliant wit his medications but his weight has increased 12 pounds since last week. Upon physical examination he had bilateral lower extremity edema. I contacted the HF clinic and was advised to have him start 20 g of lasix daily and return for a BMP next week. His medications were verified and his pillbox was refilled wit the necessary adjustments. I will follow up next week.   Jacqualine Code, EMT 05/04/20  ACTION: Home visit completed Next visit planned for 1 week

## 2020-05-04 NOTE — Telephone Encounter (Signed)
Advised patient start Lasix 20 mg daily, continue current potassium supplement. Will need BMET in 1 week and RN visit with ReDs Clip.  Jacklynn Ganong DNP, FNP-BC

## 2020-05-04 NOTE — Telephone Encounter (Signed)
Jeremy Powers, Paramedicine called stating that patient is experiencing bilateral LE edema along with a weight increase. Patients weight today was 135lbs. Patient denies all other symptoms including SOB and dizziness. Please advise. Blood pressure is 140/63

## 2020-05-10 ENCOUNTER — Other Ambulatory Visit: Payer: Self-pay

## 2020-05-10 ENCOUNTER — Ambulatory Visit (HOSPITAL_COMMUNITY)
Admission: RE | Admit: 2020-05-10 | Discharge: 2020-05-10 | Disposition: A | Discharge status: Home / Self Care | Payer: Medicare Other | Source: Ambulatory Visit | Attending: Internal Medicine | Admitting: Internal Medicine

## 2020-05-10 DIAGNOSIS — I5022 Chronic systolic (congestive) heart failure: Secondary | ICD-10-CM | POA: Insufficient documentation

## 2020-05-10 LAB — BASIC METABOLIC PANEL
Anion gap: 10 (ref 5–15)
BUN: <5 mg/dL — ABNORMAL LOW (ref 8–23)
CO2: 23 mmol/L (ref 22–32)
Calcium: 8.5 mg/dL — ABNORMAL LOW (ref 8.9–10.3)
Chloride: 105 mmol/L (ref 98–111)
Creatinine, Ser: 0.9 mg/dL (ref 0.61–1.24)
GFR, Estimated: >60 mL/min (ref >60)
Glucose, Bld: 89 mg/dL (ref 70–99)
Potassium: 4.2 mmol/L (ref 3.5–5.1)
Sodium: 138 mmol/L (ref 135–145)

## 2020-05-10 LAB — DIGOXIN LEVEL: Digoxin Level: <0.2 ng/mL — ABNORMAL LOW (ref 0.8–2.0)

## 2020-05-11 ENCOUNTER — Other Ambulatory Visit (HOSPITAL_COMMUNITY): Payer: Self-pay

## 2020-05-11 ENCOUNTER — Other Ambulatory Visit (HOSPITAL_COMMUNITY): Payer: Self-pay | Admitting: *Deleted

## 2020-05-11 MED ORDER — ENTRESTO 24-26 MG PO TABS: 1.0000 | ORAL_TABLET | Freq: Two times a day (BID) | ORAL | 3 refills | Status: DC

## 2020-05-11 MED ORDER — POTASSIUM CHLORIDE CRYS ER 20 MEQ PO TBCR: 40.0000 meq | EXTENDED_RELEASE_TABLET | Freq: Two times a day (BID) | ORAL | 3 refills | Status: DC

## 2020-05-11 NOTE — Progress Notes (Signed)
Paramedicine Encounter    Patient ID: Jeremy Powers, male    DOB: 1955/11/27, 64 y.o.   MRN: [id number]   Patient Care Team: Jeremy Powers as PCP - General (Internal Medicine)  Patient Active Problem List   Diagnosis Date Noted  . Acute systolic (congestive) heart failure (HCC) 06/24/2018  . Aortic insufficiency 06/22/2018  . Acute on chronic congestive heart failure (HCC) 06/22/2018  . Elevated troponin 06/22/2018  . CAD (coronary artery disease) 06/22/2018  . Left bundle branch block 06/22/2018  . History of iron deficiency anemia 06/22/2018  . Acute on chronic heart failure (HCC) 06/22/2018    Current Outpatient Medications:  .  albuterol (VENTOLIN HFA) 108 (90 Base) MCG/ACT inhaler, Inhale 2 puffs into the lungs every 6 (six) hours as needed for wheezing or shortness of breath., Disp: 18 g, Rfl: 1 .  aspirin EC 81 MG tablet, Take 81 mg by mouth daily., Disp: , Rfl:  .  atorvastatin (LIPITOR) 40 MG tablet, TAKE 1 TABLET BY MOUTH DAILY AT 6PM, Disp: 30 tablet, Rfl: 5 .  carvedilol (COREG) 3.125 MG tablet, Take 1 tablet (3.125 mg total) by mouth 2 (two) times daily with a meal., Disp: 180 tablet, Rfl: 3 .  dapagliflozin propanediol (FARXIGA) 10 MG TABS tablet, Take 1 tablet (10 mg total) by mouth daily before breakfast., Disp: 30 tablet, Rfl: 3 .  ferrous sulfate 325 (65 FE) MG tablet, Take 1 tablet (325 mg total) by mouth 2 (two) times daily with a meal., Disp: 180 tablet, Rfl: 3 .  potassium chloride SA (KLOR-CON) 20 MEQ tablet, Take 2 tablets (40 mEq total) by mouth 2 (two) times daily., Disp: 60 tablet, Rfl: 3 .  sacubitril-valsartan (ENTRESTO) 24-26 MG, Take 1 tablet by mouth 2 (two) times daily., Disp: 60 tablet, Rfl: 3 .  spironolactone (ALDACTONE) 25 MG tablet, TAKE 1 TABLET BY MOUTH EACH NIGHT AT BEDTIME, Disp: 30 tablet, Rfl: 2 .  sertraline (ZOLOFT) 25 MG tablet, Take 1 tablet (25 mg total) by mouth daily. (Patient not taking: Reported on 05/11/2020), Disp: 30 tablet,  Rfl: 0 No Known Allergies    Social History   Socioeconomic History  . Marital status: Single    Spouse name: Not on file  . Number of children: Not on file  . Years of education: Not on file  . Highest education level: Not on file  Occupational History  . Not on file  Tobacco Use  . Smoking status: Former Smoker    Packs/day: 0.50    Types: Cigarettes  . Smokeless tobacco: Never Used  Vaping Use  . Vaping Use: Never used  Substance and Sexual Activity  . Alcohol use: Yes    Comment: daily  . Drug use: Yes    Types: Marijuana  . Sexual activity: Not on file  Other Topics Concern  . Not on file  Social History Narrative  . Not on file   Social Determinants of Health   Financial Resource Strain: Not on file  Food Insecurity: Not on file  Transportation Needs: Not on file  Physical Activity: Not on file  Stress: Not on file  Social Connections: Not on file  Intimate Partner Violence: Not on file    Physical Exam Cardiovascular:     Rate and Rhythm: Normal rate and regular rhythm.     Pulses: Normal pulses.  Pulmonary:     Effort: Pulmonary effort is normal.     Breath sounds: Normal breath sounds.  Musculoskeletal:  General: Normal range of motion.     Right lower leg: No edema.     Left lower leg: No edema.  Skin:    General: Skin is warm and dry.     Capillary Refill: Capillary refill takes less than 2 seconds.  Neurological:     Mental Status: He is alert and oriented to person, place, and time.  Psychiatric:        Mood and Affect: Mood normal.         Future Appointments  Date Time Provider Department Center  05/23/2020 10:00 AM [facility] PA/NP [facility] None  05/31/2020  8:40 AM Jeremy Powers, Jeremy Dredge, PA-C [facility]  07/06/2020 10:00 AM MC ECHO OP 1 [facility]  07/06/2020 11:00 AM Bensimhon, Bevelyn Buckles, MD [facility] None    BP 114/60 (BP Location: Left Arm, Patient Position: Sitting, Cuff Size: Normal)   Pulse 62   Resp 16   Wt 125 lb (56.7  kg)   SpO2 92%   BMI 20.18 kg/m   Weight yesterday- 124 lb Last visit weight- 135 lb  Mr Jeremy Powers was seen at home today and reported feeling well. He denied chest pain, SOB, headache, dizziness, orthopnea, fever or cough over the past week. He stated he has been compliant with his medications and his weight has been trending down. His medications were verified and his pillbox was refilled. I will follow up next week.   Jeremy Powers, Jeremy Powers 05/11/20  ACTION: Home visit completed Next visit planned for 1 week

## 2020-05-18 ENCOUNTER — Other Ambulatory Visit (HOSPITAL_COMMUNITY): Payer: Self-pay

## 2020-05-18 ENCOUNTER — Telehealth: Payer: Self-pay | Admitting: Medical

## 2020-05-18 ENCOUNTER — Other Ambulatory Visit (HOSPITAL_COMMUNITY): Payer: Self-pay | Admitting: Internal Medicine

## 2020-05-18 NOTE — Progress Notes (Signed)
Paramedicine Encounter    Patient ID: Jeremy Powers, male    DOB: 07-Jul-1955, 64 y.o.   MRN: [id number]   Patient Care Team: [name] as PCP - General (Internal Medicine)  Patient Active Problem List   Diagnosis Date Noted  . Acute systolic (congestive) heart failure (HCC) 06/24/2018  . Aortic insufficiency 06/22/2018  . Acute on chronic congestive heart failure (HCC) 06/22/2018  . Elevated troponin 06/22/2018  . CAD (coronary artery disease) 06/22/2018  . Left bundle branch block 06/22/2018  . History of iron deficiency anemia 06/22/2018  . Acute on chronic heart failure (HCC) 06/22/2018    Current Outpatient Medications:  .  albuterol (VENTOLIN HFA) 108 (90 Base) MCG/ACT inhaler, Inhale 2 puffs into the lungs every 6 (six) hours as needed for wheezing or shortness of breath., Disp: 18 g, Rfl: 1 .  aspirin EC 81 MG tablet, Take 81 mg by mouth daily., Disp: , Rfl:  .  atorvastatin (LIPITOR) 40 MG tablet, TAKE 1 TABLET BY MOUTH DAILY AT 6PM, Disp: 30 tablet, Rfl: 5 .  carvedilol (COREG) 3.125 MG tablet, Take 1 tablet (3.125 mg total) by mouth 2 (two) times daily with a meal., Disp: 180 tablet, Rfl: 3 .  dapagliflozin propanediol (FARXIGA) 10 MG TABS tablet, Take 1 tablet (10 mg total) by mouth daily before breakfast., Disp: 30 tablet, Rfl: 3 .  ferrous sulfate 325 (65 FE) MG tablet, Take 1 tablet (325 mg total) by mouth 2 (two) times daily with a meal., Disp: 180 tablet, Rfl: 3 .  potassium chloride SA (KLOR-CON) 20 MEQ tablet, Take 2 tablets (40 mEq total) by mouth 2 (two) times daily., Disp: 60 tablet, Rfl: 3 .  sacubitril-valsartan (ENTRESTO) 24-26 MG, Take 1 tablet by mouth 2 (two) times daily., Disp: 60 tablet, Rfl: 3 .  sertraline (ZOLOFT) 25 MG tablet, Take 1 tablet (25 mg total) by mouth daily. (Patient not taking: Reported on 05/11/2020), Disp: 30 tablet, Rfl: 0 .  spironolactone (ALDACTONE) 25 MG tablet, TAKE 1 TABLET BY MOUTH EACH NIGHT AT BEDTIME, Disp: 30 tablet,  Rfl: 2 No Known Allergies    Social History   Socioeconomic History  . Marital status: Single    Spouse name: Not on file  . Number of children: Not on file  . Years of education: Not on file  . Highest education level: Not on file  Occupational History  . Not on file  Tobacco Use  . Smoking status: Former Smoker    Packs/day: 0.50    Types: Cigarettes  . Smokeless tobacco: Never Used  Vaping Use  . Vaping Use: Never used  Substance and Sexual Activity  . Alcohol use: Yes    Comment: daily  . Drug use: Yes    Types: Marijuana  . Sexual activity: Not on file  Other Topics Concern  . Not on file  Social History Narrative  . Not on file   Social Determinants of Health   Financial Resource Strain: Not on file  Food Insecurity: Not on file  Transportation Needs: Not on file  Physical Activity: Not on file  Stress: Not on file  Social Connections: Not on file  Intimate Partner Violence: Not on file    Physical Exam Cardiovascular:     Rate and Rhythm: Normal rate and regular rhythm.     Pulses: Normal pulses.  Pulmonary:     Effort: Pulmonary effort is normal.     Breath sounds: Normal breath sounds.  Musculoskeletal:  General: Normal range of motion.     Right lower leg: No edema.     Left lower leg: No edema.  Skin:    General: Skin is warm and dry.     Capillary Refill: Capillary refill takes less than 2 seconds.  Neurological:     Mental Status: He is alert and oriented to person, place, and time.  Psychiatric:        Mood and Affect: Mood normal.         Future Appointments  Date Time Provider Department Center  05/23/2020 10:00 AM [facility] PA/NP [facility] None  05/31/2020  8:40 AM Saguier, Ramon Dredge, PA-C [facility]  07/06/2020 10:00 AM [facility] ECHO OP 1 [facility]  07/06/2020 11:00 AM Bensimhon, Bevelyn Buckles, MD [facility] None    BP (!) 93/55 (BP Location: Left Arm, Patient Position: Sitting, Cuff Size: Normal)   Resp 16   Wt 118 lb 9.6 oz (53.8  kg)   SpO2 100%   BMI 19.14 kg/m   Weight yesterday- 123.2 lb Last visit weight- 125 lb  Mr Shives was seen at home today and reported feeling well. He denied chest pain, SOB, headache, dizziness, orthopnea, fever or cough over the past week. He stated he has been compliant with his medications and his weight has been stable. His medications were verified and his pillbox was refilled I will follow up next week.   Jacqualine Code, EMT 05/18/20  ACTION: Home visit completed Next visit planned for 1 week

## 2020-05-18 NOTE — Telephone Encounter (Signed)
Prescription was sent to the wrong pharmacy   sertraline (ZOLOFT) 25 MG tablet [[id number]]    [facility] - [place], [place] - [address]  [address] [place], [place] [address]  Phone:  [phone] Fax:  [phone]

## 2020-05-19 ENCOUNTER — Other Ambulatory Visit (HOSPITAL_COMMUNITY): Payer: Self-pay

## 2020-05-19 MED ORDER — SERTRALINE HCL 25 MG PO TABS: 25.0000 mg | ORAL_TABLET | Freq: Every day | ORAL | 0 refills | Status: DC

## 2020-05-19 MED ORDER — POTASSIUM CHLORIDE CRYS ER 20 MEQ PO TBCR: 40.0000 meq | EXTENDED_RELEASE_TABLET | Freq: Two times a day (BID) | ORAL | 3 refills | Status: DC

## 2020-05-19 NOTE — Telephone Encounter (Signed)
Med resent to [facility]

## 2020-05-23 ENCOUNTER — Other Ambulatory Visit: Payer: Self-pay

## 2020-05-23 ENCOUNTER — Encounter (HOSPITAL_COMMUNITY): Payer: Self-pay

## 2020-05-23 ENCOUNTER — Ambulatory Visit (HOSPITAL_COMMUNITY)
Admission: RE | Admit: 2020-05-23 | Discharge: 2020-05-23 | Disposition: A | Discharge status: Home / Self Care | Payer: Medicare Other | Source: Ambulatory Visit | Attending: Family Medicine | Admitting: Family Medicine

## 2020-05-23 VITALS — BP 142/80 | HR 121 | Wt 116.2 lb

## 2020-05-23 DIAGNOSIS — I110 Hypertensive heart disease with heart failure: Secondary | ICD-10-CM | POA: Diagnosis not present

## 2020-05-23 DIAGNOSIS — I10 Essential (primary) hypertension: Secondary | ICD-10-CM

## 2020-05-23 DIAGNOSIS — Z951 Presence of aortocoronary bypass graft: Secondary | ICD-10-CM | POA: Diagnosis not present

## 2020-05-23 DIAGNOSIS — Z79899 Other long term (current) drug therapy: Secondary | ICD-10-CM | POA: Diagnosis not present

## 2020-05-23 DIAGNOSIS — I2510 Atherosclerotic heart disease of native coronary artery without angina pectoris: Secondary | ICD-10-CM

## 2020-05-23 DIAGNOSIS — Z7982 Long term (current) use of aspirin: Secondary | ICD-10-CM | POA: Diagnosis not present

## 2020-05-23 DIAGNOSIS — Z720 Tobacco use: Secondary | ICD-10-CM

## 2020-05-23 DIAGNOSIS — Z955 Presence of coronary angioplasty implant and graft: Secondary | ICD-10-CM | POA: Diagnosis not present

## 2020-05-23 DIAGNOSIS — I447 Left bundle-branch block, unspecified: Secondary | ICD-10-CM

## 2020-05-23 DIAGNOSIS — Z550 Illiteracy and low-level literacy: Secondary | ICD-10-CM | POA: Diagnosis not present

## 2020-05-23 DIAGNOSIS — Z87891 Personal history of nicotine dependence: Secondary | ICD-10-CM | POA: Insufficient documentation

## 2020-05-23 DIAGNOSIS — I5022 Chronic systolic (congestive) heart failure: Secondary | ICD-10-CM

## 2020-05-23 DIAGNOSIS — I428 Other cardiomyopathies: Secondary | ICD-10-CM | POA: Diagnosis not present

## 2020-05-23 DIAGNOSIS — I252 Old myocardial infarction: Secondary | ICD-10-CM | POA: Diagnosis not present

## 2020-05-23 DIAGNOSIS — D509 Iron deficiency anemia, unspecified: Secondary | ICD-10-CM | POA: Diagnosis not present

## 2020-05-23 LAB — BASIC METABOLIC PANEL
Anion gap: 15 (ref 5–15)
BUN: 16 mg/dL (ref 8–23)
CO2: 20 mmol/L — ABNORMAL LOW (ref 22–32)
Calcium: 9.1 mg/dL (ref 8.9–10.3)
Chloride: 100 mmol/L (ref 98–111)
Creatinine, Ser: 1.41 mg/dL — ABNORMAL HIGH (ref 0.61–1.24)
GFR, Estimated: 55 mL/min — ABNORMAL LOW (ref >60)
Glucose, Bld: 110 mg/dL — ABNORMAL HIGH (ref 70–99)
Potassium: 3.9 mmol/L (ref 3.5–5.1)
Sodium: 135 mmol/L (ref 135–145)

## 2020-05-23 NOTE — Patient Instructions (Signed)
It was great to see you today! No medication changes are needed at this time.   Labs today We will only contact you if something comes back abnormal or we need to make some changes. Otherwise no news is good news!  Your physician recommends that you schedule a follow-up appointment in: 4-6 weeks with Dr [name] and echo  Your physician has requested that you have an echocardiogram. Echocardiography is a painless test that uses sound waves to create images of your heart. It provides your doctor with information about the size and shape of your heart and how well your hearts chambers and valves are working. This procedure takes approximately one hour. There are no restrictions for this procedure.  If you have any questions or concerns before your next appointment please send [place] a message through [place] or call our office at [phone].    TO LEAVE A MESSAGE FOR THE NURSE SELECT OPTION 2, PLEASE LEAVE A MESSAGE INCLUDING:  YOUR NAME  DATE OF BIRTH  CALL BACK NUMBER  REASON FOR CALL**this is important as we prioritize the call backs  YOU WILL RECEIVE A CALL BACK THE SAME DAY AS LONG AS YOU CALL BEFORE 4:00 PM

## 2020-05-23 NOTE — Progress Notes (Signed)
PCP: Mackie Pai, PA-C Primary Cardiologist: Dr Haroldine Laws  HPI: Jeremy Powers is a 65 y.o. male with a history of CAD with NSTEMI, s/p PCI to LAD in 2015 and 2016, s/p CABG 3976, systolic HF EF 73-41%, microcytic/iron deficiency anemia followed by hematology and GI, LBBB, and tobacco use. Illiterate and cannot read.   In 2/20 he presented to [facility] ED with SOB, cough, and orthopnea in setting of med non-compliance. Was unable to afford copays with Medicaid. He was tachycardic and tachypneic on arrival, and was transferred to [facility]2/[facility]/20for further evaluation. Echo performed which showed newly decreased EF of 25-30% from 50-55% in 12/2017. Taken for [facility] which showed severe 1 v disease with occluded LAD within previous stent. RCA and LCX normal. LIMA to LAD and SVG to D2 patent. Cath also showed low filling pressures with moderately low cardiac output. Meds adjusted as tolerated.  OV 04/05/20 returns for HF follow up after not being seen in > 1 year. Lives with his girlfriend. Followed by HF Paramedicine. Lasix was stopped & Wilder Glade was started.  Today he returns for HF follow up. Overall feeling fine. Denies SOB, CP, palpitations, or PND/Orthopnea. Intermittent dizziness with standing, resolves quickly. No fatigue. Appetite ok. No fever or chills. Weight at home ~115 pounds. Confirmed with Zack (Paramedicine) he is taking all medications. Not taking lasix.   Echo 01/2019: EF 20-25%  Echo 06/2018: EF 20-25%  cMRI 06/27/18: EF 24%. Normal RV  RHC/LHC 06/24/2018 RA = 1 RV = 19/3 PA = 24/5 (14) PCW = 7 Fick cardiac output/index = 4.0/2.5 PVR = 1.9 WU Ao sat = 95% PA sat = 68%, 71%  Assessment: 1. Severe 1-vessel CAD with occluded LAD within previous stent 2. Normal RCA and LCX 3. LIMA to LAD widely patent 4. SVG - D2 widely patent 5. SVG - D1 likely occluded (grafts not marked)   ROS: All systems negative except as listed in HPI, PMH and Problem List.  SH:  Social History    Socioeconomic History  . Marital status: Single    Spouse name: Not on file  . Number of children: Not on file  . Years of education: Not on file  . Highest education level: Not on file  Occupational History  . Not on file  Tobacco Use  . Smoking status: Former Smoker    Packs/day: 0.50    Types: Cigarettes  . Smokeless tobacco: Never Used  Vaping Use  . Vaping Use: Never used  Substance and Sexual Activity  . Alcohol use: Yes    Comment: daily  . Drug use: Yes    Types: Marijuana  . Sexual activity: Not on file  Other Topics Concern  . Not on file  Social History Narrative  . Not on file   Social Determinants of Health   Financial Resource Strain: Not on file  Food Insecurity: Not on file  Transportation Needs: Not on file  Physical Activity: Not on file  Stress: Not on file  Social Connections: Not on file  Intimate Partner Violence: Not on file    FH: No family history on file.  Past Medical History:  Diagnosis Date  . Cirrhosis ([place])   . Coronary artery disease   . Hypertension   . Pancreatitis     Current Outpatient Medications  Medication Sig Dispense Refill  . albuterol (VENTOLIN HFA) 108 (90 Base) MCG/ACT inhaler Inhale 2 puffs into the lungs every 6 (six) hours as needed for wheezing or shortness of breath. 18 g  1  . aspirin EC 81 MG tablet Take 81 mg by mouth daily.    Marland Kitchen atorvastatin (LIPITOR) 40 MG tablet TAKE 1 TABLET BY MOUTH DAILY AT 6PM 30 tablet 5  . carvedilol (COREG) 3.125 MG tablet Take 1 tablet (3.125 mg total) by mouth 2 (two) times daily with a meal. 180 tablet 3  . dapagliflozin propanediol (FARXIGA) 10 MG TABS tablet Take 1 tablet (10 mg total) by mouth daily before breakfast. 30 tablet 3  . FEROSUL 325 (65 Fe) MG tablet TAKE 1 TABLET BY MOUTH TWICE DAILY WITH A MEAL 180 tablet 3  . potassium chloride SA (KLOR-CON) 20 MEQ tablet Take 2 tablets (40 mEq total) by mouth 2 (two) times daily. 120 tablet 3  . sacubitril-valsartan  (ENTRESTO) 24-26 MG Take 1 tablet by mouth 2 (two) times daily. 60 tablet 3  . sertraline (ZOLOFT) 25 MG tablet Take 1 tablet (25 mg total) by mouth daily. 30 tablet 0  . spironolactone (ALDACTONE) 25 MG tablet TAKE 1 TABLET BY MOUTH EACH NIGHT AT BEDTIME 30 tablet 2   No current facility-administered medications for this encounter.    Vitals:   05/23/20 1054  BP: (!) 142/80  Pulse: (!) 121  SpO2: 99%  Weight: 52.7 kg (116 lb 3.2 oz)    Wt Readings from Last 3 Encounters:  05/23/20 52.7 kg (116 lb 3.2 oz)  05/18/20 53.8 kg (118 lb 9.6 oz)  05/11/20 56.7 kg (125 lb)   ECG: ST LBBB, 100 bpm (Personally reviewed) ReDs: 18%  PHYSICAL EXAM: General:  Thin, NAD. No resp difficulty HEENT: Normal Neck: Supple. No JVD. Carotids 2+ bilat; no bruits. No lymphadenopathy or thryomegaly appreciated. Cor: PMI nondisplaced. Regular tachy rate & rhythm. No rubs, gallops or murmurs. Lungs: Clear Abdomen: Soft, nontender, nondistended. No hepatosplenomegaly. No bruits or masses. Good bowel sounds. Extremities: No cyanosis, clubbing, rash, edema Neuro: Alert & oriented x 3, cranial nerves grossly intact. Moves all 4 extremities w/o difficulty. Affect pleasant.   ASSESSMENT & PLAN: 1.Chronic Systolic Heart Failure, NICM - Echo 06/2018 EF 25-30%,  cMRI 06/27/2018 with EF 24%, Echo 01/2019 EF 20-25%  - NYHA III. - Volume status stable, ReDs suggests slight hypovolemia, but no worrisome symptoms.  - Continue Farxiga. A1c 4.6 (12/21) & glucose on labs acceptable. - Continue carvedilol to 6.25 mg twice a day.  - Continue Entresto 24-26 mg twice a day.  - Continue spironolactone to 25 mg daily at bedtime.  - Check BMET today. - Repeat Echo now that medications are optimized in 1 month. ? Needs CRT-D.  - Continue with Paramedicine.   2. CAD - S/P CABG 2017, LHC 06/2018  with occluded LAD normal LCX and RCA. LIMA to LAD and SVG to D2 patent (SVG to D1 occluded) - No chest pain.  - Continue aspirin  81 mg daily + atorvastatin.     3. LBBB - EKG QRS 170 ms.  - ? CRT-D placement.   4.  Tobacco Abuse - No longer smoking. Lives with people who do smoke.  5. HTN - Slightly elevated today but has not taken his AM meds yet. - Continue current regimen.   6. Illiterate - Cannot read. Able to write numbers. Needs assistance with medications.  - Continue with paramedicine.  Discussed medication changes and recommendations for the management of chronic heart failure to control volume/symptoms and reduce risk of acute exacerbation that may necessitate hospitalization.  These measures include continuation of current diuretics with close outpatient monitoring of volume  status through daily weights.  Patient advised to check weight daily and to call our office if greater than 3 pound weight gain in 24 hours or greater than 5 pound weight gain in the course of 1 week. Patient also strongly encouraged to adhere to a low salt diet, reducing intake to less than 2 g daily.  Follow up with Dr. [name] in 4 weeks with Echo.   Prince Rome, FNP-BC 05/23/20 11:04 AM

## 2020-05-25 ENCOUNTER — Telehealth (HOSPITAL_COMMUNITY): Payer: Self-pay

## 2020-05-25 ENCOUNTER — Other Ambulatory Visit (HOSPITAL_COMMUNITY): Payer: Self-pay

## 2020-05-25 ENCOUNTER — Other Ambulatory Visit (HOSPITAL_COMMUNITY): Payer: Self-pay | Admitting: Cardiology

## 2020-05-25 MED ORDER — ATORVASTATIN CALCIUM 40 MG PO TABS: ORAL_TABLET | ORAL | 5 refills | Status: DC

## 2020-05-25 NOTE — Telephone Encounter (Signed)
I called Jeremy Powers to schedule an appointment. He did not answer so I left a message requesting he call me back.  Jacqualine Code, EMT 05/25/20

## 2020-05-25 NOTE — Progress Notes (Signed)
Paramedicine Encounter    Patient ID: MAKANA ROSTAD, male    DOB: 11/26/1955, 65 y.o.   MRN: [id number]   Patient Care Team: [name] as PCP - General (Internal Medicine)  Patient Active Problem List   Diagnosis Date Noted  . Acute systolic (congestive) heart failure (HCC) 06/24/2018  . Aortic insufficiency 06/22/2018  . Acute on chronic congestive heart failure (HCC) 06/22/2018  . Elevated troponin 06/22/2018  . CAD (coronary artery disease) 06/22/2018  . Left bundle branch block 06/22/2018  . History of iron deficiency anemia 06/22/2018  . Acute on chronic heart failure (HCC) 06/22/2018    Current Outpatient Medications:  .  albuterol (VENTOLIN HFA) 108 (90 Base) MCG/ACT inhaler, Inhale 2 puffs into the lungs every 6 (six) hours as needed for wheezing or shortness of breath., Disp: 18 g, Rfl: 1 .  aspirin EC 81 MG tablet, Take 81 mg by mouth daily., Disp: , Rfl:  .  atorvastatin (LIPITOR) 40 MG tablet, TAKE 1 TABLET BY MOUTH DAILY AT 6PM, Disp: 30 tablet, Rfl: 5 .  carvedilol (COREG) 3.125 MG tablet, Take 1 tablet (3.125 mg total) by mouth 2 (two) times daily with a meal., Disp: 180 tablet, Rfl: 3 .  dapagliflozin propanediol (FARXIGA) 10 MG TABS tablet, Take 1 tablet (10 mg total) by mouth daily before breakfast., Disp: 30 tablet, Rfl: 3 .  FEROSUL 325 (65 Fe) MG tablet, TAKE 1 TABLET BY MOUTH TWICE DAILY WITH A MEAL, Disp: 180 tablet, Rfl: 3 .  potassium chloride SA (KLOR-CON) 20 MEQ tablet, Take 2 tablets (40 mEq total) by mouth 2 (two) times daily., Disp: 120 tablet, Rfl: 3 .  sacubitril-valsartan (ENTRESTO) 24-26 MG, Take 1 tablet by mouth 2 (two) times daily., Disp: 60 tablet, Rfl: 3 .  sertraline (ZOLOFT) 25 MG tablet, Take 1 tablet (25 mg total) by mouth daily., Disp: 30 tablet, Rfl: 0 .  spironolactone (ALDACTONE) 25 MG tablet, TAKE 1 TABLET BY MOUTH EACH NIGHT AT BEDTIME, Disp: 30 tablet, Rfl: 2 No Known Allergies    Social History   Socioeconomic History   . Marital status: Single    Spouse name: Not on file  . Number of children: Not on file  . Years of education: Not on file  . Highest education level: Not on file  Occupational History  . Not on file  Tobacco Use  . Smoking status: Former Smoker    Packs/day: 0.50    Types: Cigarettes  . Smokeless tobacco: Never Used  Vaping Use  . Vaping Use: Never used  Substance and Sexual Activity  . Alcohol use: Yes    Comment: daily  . Drug use: Yes    Types: Marijuana  . Sexual activity: Not on file  Other Topics Concern  . Not on file  Social History Narrative  . Not on file   Social Determinants of Health   Financial Resource Strain: Not on file  Food Insecurity: Not on file  Transportation Needs: Not on file  Physical Activity: Not on file  Stress: Not on file  Social Connections: Not on file  Intimate Partner Violence: Not on file    Physical Exam Cardiovascular:     Rate and Rhythm: Normal rate and regular rhythm.     Pulses: Normal pulses.  Pulmonary:     Effort: Pulmonary effort is normal.     Breath sounds: Normal breath sounds.  Abdominal:     General: There is no distension.  Musculoskeletal:  General: Normal range of motion.     Right lower leg: No edema.     Left lower leg: No edema.  Skin:    General: Skin is warm and dry.     Capillary Refill: Capillary refill takes less than 2 seconds.  Neurological:     Mental Status: He is alert and oriented to person, place, and time.  Psychiatric:        Mood and Affect: Mood normal.         Future Appointments  Date Time Provider Department Center  05/31/2020  8:40 AM Saguier, Ramon Dredge, PA-C [facility]  06/02/2020  9:30 AM [facility] None  07/15/2020 10:00 AM [facility] 1 [facility]  07/15/2020 11:00 AM Bensimhon, Bevelyn Buckles, MD [facility] None    BP 103/64 (BP Location: Left Arm, Patient Position: Sitting, Cuff Size: Normal)   Pulse 90   Resp 16   Wt 113 lb 9.6 oz (51.5 kg)   SpO2 100%    BMI 18.34 kg/m   Weight yesterday- did not weigh  Last visit weight- 118.6 lb  Mr Pardo was seen at home today and reported feeling well. He denied chest pain, SOB, headache, dizziness, orthopnea, fever or cough over the past week. He stated he has been compliant with his medications over the past week and his weight has been stable. His medications were verified and his pillbox was refilled. I will follow next week.   Jacqualine Code, EMT 05/25/20  ACTION: Home visit completed Next visit planned for 1 week

## 2020-05-30 ENCOUNTER — Other Ambulatory Visit: Payer: Self-pay

## 2020-05-31 ENCOUNTER — Ambulatory Visit: Payer: Medicare Other | Admitting: Medical

## 2020-06-02 ENCOUNTER — Other Ambulatory Visit: Payer: Self-pay

## 2020-06-02 ENCOUNTER — Ambulatory Visit (HOSPITAL_COMMUNITY)
Admission: RE | Admit: 2020-06-02 | Discharge: 2020-06-02 | Disposition: A | Discharge status: Home / Self Care | Payer: Medicare Other | Source: Ambulatory Visit | Attending: Cardiology | Admitting: Cardiology

## 2020-06-02 DIAGNOSIS — I5022 Chronic systolic (congestive) heart failure: Secondary | ICD-10-CM | POA: Diagnosis present

## 2020-06-02 LAB — BASIC METABOLIC PANEL
Anion gap: 8 (ref 5–15)
BUN: <5 mg/dL — ABNORMAL LOW (ref 8–23)
CO2: 22 mmol/L (ref 22–32)
Calcium: 8.4 mg/dL — ABNORMAL LOW (ref 8.9–10.3)
Chloride: 104 mmol/L (ref 98–111)
Creatinine, Ser: 0.8 mg/dL (ref 0.61–1.24)
GFR, Estimated: >60 mL/min (ref >60)
Glucose, Bld: 94 mg/dL (ref 70–99)
Potassium: 4.8 mmol/L (ref 3.5–5.1)
Sodium: 134 mmol/L — ABNORMAL LOW (ref 135–145)

## 2020-06-07 ENCOUNTER — Ambulatory Visit (INDEPENDENT_AMBULATORY_CARE_PROVIDER_SITE_OTHER): Payer: Medicare Other | Admitting: Medical

## 2020-06-07 ENCOUNTER — Other Ambulatory Visit: Payer: Self-pay

## 2020-06-07 VITALS — BP 133/60 | HR 74 | Temp 98.2°F | Resp 18 | Ht 66.0 in | Wt 121.0 lb

## 2020-06-07 DIAGNOSIS — Z7185 Encounter for immunization safety counseling: Secondary | ICD-10-CM

## 2020-06-07 DIAGNOSIS — E785 Hyperlipidemia, unspecified: Secondary | ICD-10-CM

## 2020-06-07 DIAGNOSIS — I10 Essential (primary) hypertension: Secondary | ICD-10-CM | POA: Diagnosis not present

## 2020-06-07 DIAGNOSIS — I5021 Acute systolic (congestive) heart failure: Secondary | ICD-10-CM | POA: Diagnosis not present

## 2020-06-07 DIAGNOSIS — I2510 Atherosclerotic heart disease of native coronary artery without angina pectoris: Secondary | ICD-10-CM

## 2020-06-07 MED ORDER — SERTRALINE HCL 25 MG PO TABS: 25.0000 mg | ORAL_TABLET | Freq: Every day | ORAL | 5 refills | Status: DC

## 2020-06-07 NOTE — Patient Instructions (Addendum)
CHF is clnically well controlled.  Continue entresto, klor con and spirnolactone.  For diabetes, you average sugar is was well  well controlled December . Can take 1/2 10 mg tab   farxiga/ dapaglifolozin.  For htn continue coreg.  For chf continue entresto, klor con and spirnolactone.  Depression and anxiety well controlled. Continue sertraline 25 mg daily dose.  Vaccine counseling given. Pt declined covid vaccine.  Follow up in 3 months or as needed.

## 2020-06-07 NOTE — Progress Notes (Signed)
Subjective:    Patient ID: Jeremy Powers, male    DOB: [birth year], 65 y.o.   MRN: [id number]  HPI  Pt in for follow up.  Hx of htn, diabetes, high cholesterol, chf and depression.  Pt is on coreg for htn.  For chf continue entresto, klor con and spirnolactone.  For diabetes farxiga. Last a1c was 4.6. pt states not seeing endocrinologist.  Pt was depressed and anxious last visit. He states sertraline is helping him a lot. No homicidal or suicidal ideations. PHq-9 score 4.     Review of Systems  Constitutional: Negative for chills, fatigue and fever.  Respiratory: Negative for cough, chest tightness, shortness of breath and wheezing.   Cardiovascular: Negative for chest pain and palpitations.  Gastrointestinal: Negative for abdominal distention and vomiting.  Musculoskeletal: Negative for back pain and myalgias.  Skin: Negative for rash.  Neurological: Negative for dizziness, tremors, seizures, weakness and headaches.  Hematological: Negative for adenopathy. Does not bruise/bleed easily.  Psychiatric/Behavioral: Positive for dysphoric mood. Negative for behavioral problems, decreased concentration and suicidal ideas. The patient is nervous/anxious.        Only mild much improved.   Past Medical History:  Diagnosis Date  . Cirrhosis (HCC)   . Coronary artery disease   . Hypertension   . Pancreatitis      Social History   Socioeconomic History  . Marital status: Single    Spouse name: Not on file  . Number of children: Not on file  . Years of education: Not on file  . Highest education level: Not on file  Occupational History  . Not on file  Tobacco Use  . Smoking status: Former Smoker    Packs/day: 0.50    Types: Cigarettes  . Smokeless tobacco: Never Used  Vaping Use  . Vaping Use: Never used  Substance and Sexual Activity  . Alcohol use: Yes    Comment: daily  . Drug use: Yes    Types: Marijuana  . Sexual activity: Not on file  Other Topics Concern  .  Not on file  Social History Narrative  . Not on file   Social Determinants of Health   Financial Resource Strain: Not on file  Food Insecurity: Not on file  Transportation Needs: Not on file  Physical Activity: Not on file  Stress: Not on file  Social Connections: Not on file  Intimate Partner Violence: Not on file    Past Surgical History:  Procedure Laterality Date  . CARDIAC SURGERY    . RIGHT/LEFT HEART CATH AND CORONARY/GRAFT ANGIOGRAPHY N/A 06/24/2018   Procedure: RIGHT/LEFT HEART CATH AND CORONARY/GRAFT ANGIOGRAPHY;  Surgeon: Dolores Patty, MD;  [facility];  Service: Cardiovascular;  Laterality: N/A;    No family history on file.  No Known Allergies  Current Outpatient Medications on File Prior to Visit  Medication Sig Dispense Refill  . albuterol (VENTOLIN HFA) 108 (90 Base) MCG/ACT inhaler Inhale 2 puffs into the lungs every 6 (six) hours as needed for wheezing or shortness of breath. 18 g 1  . aspirin EC 81 MG tablet Take 81 mg by mouth daily.    Marland Kitchen atorvastatin (LIPITOR) 40 MG tablet TAKE 1 TABLET BY MOUTH DAILY AT 6PM 30 tablet 5  . carvedilol (COREG) 3.125 MG tablet Take 1 tablet (3.125 mg total) by mouth 2 (two) times daily with a meal. 180 tablet 3  . dapagliflozin propanediol (FARXIGA) 10 MG TABS tablet Take 1 tablet (10 mg total) by  mouth daily before breakfast. 30 tablet 3  . FEROSUL 325 (65 Fe) MG tablet TAKE 1 TABLET BY MOUTH TWICE DAILY WITH A MEAL 180 tablet 3  . potassium chloride SA (KLOR-CON) 20 MEQ tablet Take 2 tablets (40 mEq total) by mouth 2 (two) times daily. 120 tablet 3  . sacubitril-valsartan (ENTRESTO) 24-26 MG Take 1 tablet by mouth 2 (two) times daily. 60 tablet 3  . sertraline (ZOLOFT) 25 MG tablet Take 1 tablet (25 mg total) by mouth daily. 30 tablet 0  . spironolactone (ALDACTONE) 25 MG tablet TAKE 1 TABLET BY MOUTH EACH NIGHT AT BEDTIME 30 tablet 2   No current facility-administered medications on file prior to  visit.    BP 133/60   Pulse 74   Temp 98.2 F (36.8 C)   Resp 18   Ht 5\' 6"  (1.676 m)   Wt 121 lb (54.9 kg)   SpO2 100%   BMI 19.53 kg/m       Objective:   Physical Exam   General Mental Status- Alert. General Appearance- Not in acute distress.   Skin General: Color- Normal Color. Moisture- Normal Moisture.  Neck Carotid Arteries- Normal color. Moisture- Normal Moisture. No carotid bruits. No JVD.  Chest and Lung Exam Auscultation: Breath Sounds:-Normal.  Cardiovascular Auscultation:Rythm- Regular. Murmurs & Other Heart Sounds:Auscultation of the heart reveals- No Murmurs.  Abdomen Inspection:-Inspeection Normal. Palpation/Percussion:Note:No mass. Palpation and Percussion of the abdomen reveal- Non Tender, Non Distended + BS, no rebound or guarding.    Neurologic Cranial Nerve exam:- CN III-XII intact(No nystagmus), symmetric smile. Strength:- 5/5 equal and symmetric strength both upper and lower extremities.  Lower ext- no pedal edema.       Assessment & Plan:  CHF is clnically well controlled.  Continue entresto, klor con and spirnolactone.  For diabetes, you average sugar is was well  well controlled December . Can take 1/2 10 mg tab   farxiga/ dapaglifolozin.  For htn continue coreg.  For chf continue entresto, klor con and spirnolactone.  Depression and anxiety well controlled. Continue sertraline 25 mg daily dose.  Vaccine counseling given. Pt declined covid vaccine.  Follow up in 3 months or as needed.  January, PA-C

## 2020-06-09 ENCOUNTER — Telehealth (HOSPITAL_COMMUNITY): Payer: Self-pay

## 2020-06-09 NOTE — Telephone Encounter (Signed)
I called Mr Jeremy Powers to see how he has been doing. He reported feeling well, denying chest pain, SOB, headache, dizziness, orthopnea, fever or cough over the past week. He stated he has been compliant with his medications over the past week and his weight has started coming back up from the low 110's. He stated he would be willing to have a visit next week so I will let Florentina Addison and Herbert Seta know to reach out for a visit next week.   Jacqualine Code, EMT 06/09/20

## 2020-06-15 ENCOUNTER — Telehealth (HOSPITAL_COMMUNITY): Payer: Self-pay

## 2020-06-15 NOTE — Telephone Encounter (Signed)
Left voicemail and text to [place] for scheduling a home visit. No reply or answer. I will continue reaching out.

## 2020-06-27 ENCOUNTER — Telehealth (HOSPITAL_COMMUNITY): Payer: Self-pay

## 2020-06-27 ENCOUNTER — Other Ambulatory Visit (HOSPITAL_COMMUNITY): Payer: Self-pay | Admitting: *Deleted

## 2020-06-27 MED ORDER — CARVEDILOL 3.125 MG PO TABS: 3.1250 mg | ORAL_TABLET | Freq: Two times a day (BID) | ORAL | 3 refills | Status: DC

## 2020-06-27 MED ORDER — SPIRONOLACTONE 25 MG PO TABS: ORAL_TABLET | ORAL | 2 refills | Status: DC

## 2020-06-27 NOTE — Telephone Encounter (Signed)
Jeremy Powers called me to advise that he was in need of his medications. I contacted the pharmacy who stated they could refill everything except spironolactone and requested an updated prescription of coreg. I will have the HF clinic send these in immediately for refill.   Jacqualine Code, EMT 06/27/20

## 2020-06-29 ENCOUNTER — Other Ambulatory Visit (HOSPITAL_COMMUNITY): Payer: Self-pay

## 2020-06-29 ENCOUNTER — Telehealth (HOSPITAL_COMMUNITY): Payer: Self-pay

## 2020-06-29 NOTE — Telephone Encounter (Signed)
Confirmed with patient and Zack for appointment today at 3:30-4:00.

## 2020-06-29 NOTE — Progress Notes (Signed)
Paramedicine Encounter    Patient ID: Jeremy Powers, male    DOB: February 16, 1956, 65 y.o.   MRN: [id number]   Patient Care Team: [name] as PCP - General (Internal Medicine)  Patient Active Problem List   Diagnosis Date Noted  . Acute systolic (congestive) heart failure (HCC) 06/24/2018  . Aortic insufficiency 06/22/2018  . Acute on chronic congestive heart failure (HCC) 06/22/2018  . Elevated troponin 06/22/2018  . CAD (coronary artery disease) 06/22/2018  . Left bundle branch block 06/22/2018  . History of iron deficiency anemia 06/22/2018  . Acute on chronic heart failure (HCC) 06/22/2018    Current Outpatient Medications:  .  albuterol (VENTOLIN HFA) 108 (90 Base) MCG/ACT inhaler, Inhale 2 puffs into the lungs every 6 (six) hours as needed for wheezing or shortness of breath., Disp: 18 g, Rfl: 1 .  aspirin EC 81 MG tablet, Take 81 mg by mouth daily., Disp: , Rfl:  .  atorvastatin (LIPITOR) 40 MG tablet, TAKE 1 TABLET BY MOUTH DAILY AT 6PM, Disp: 30 tablet, Rfl: 5 .  carvedilol (COREG) 3.125 MG tablet, Take 1 tablet (3.125 mg total) by mouth 2 (two) times daily with a meal., Disp: 180 tablet, Rfl: 3 .  dapagliflozin propanediol (FARXIGA) 10 MG TABS tablet, Take 1 tablet (10 mg total) by mouth daily before breakfast., Disp: 30 tablet, Rfl: 3 .  FEROSUL 325 (65 Fe) MG tablet, TAKE 1 TABLET BY MOUTH TWICE DAILY WITH A MEAL, Disp: 180 tablet, Rfl: 3 .  potassium chloride SA (KLOR-CON) 20 MEQ tablet, Take 2 tablets (40 mEq total) by mouth 2 (two) times daily., Disp: 120 tablet, Rfl: 3 .  sacubitril-valsartan (ENTRESTO) 24-26 MG, Take 1 tablet by mouth 2 (two) times daily., Disp: 60 tablet, Rfl: 3 .  sertraline (ZOLOFT) 25 MG tablet, Take 1 tablet (25 mg total) by mouth daily., Disp: 30 tablet, Rfl: 5 .  spironolactone (ALDACTONE) 25 MG tablet, TAKE 1 TABLET BY MOUTH EACH NIGHT AT BEDTIME, Disp: 30 tablet, Rfl: 2 No Known Allergies   Social History   Socioeconomic History   . Marital status: Single    Spouse name: Not on file  . Number of children: Not on file  . Years of education: Not on file  . Highest education level: Not on file  Occupational History  . Not on file  Tobacco Use  . Smoking status: Former Smoker    Packs/day: 0.50    Types: Cigarettes  . Smokeless tobacco: Never Used  Vaping Use  . Vaping Use: Never used  Substance and Sexual Activity  . Alcohol use: Yes    Comment: daily  . Drug use: Yes    Types: Marijuana  . Sexual activity: Not on file  Other Topics Concern  . Not on file  Social History Narrative  . Not on file   Social Determinants of Health   Financial Resource Strain: Not on file  Food Insecurity: Not on file  Transportation Needs: Not on file  Physical Activity: Not on file  Stress: Not on file  Social Connections: Not on file  Intimate Partner Violence: Not on file    Physical Exam Vitals reviewed.  Constitutional:      Appearance: Normal appearance. He is normal weight.  HENT:     Nose: Nose normal.     Mouth/Throat:     Mouth: Mucous membranes are moist.     Pharynx: Oropharynx is clear.  Eyes:     Conjunctiva/sclera: Conjunctivae normal.  Pupils: Pupils are equal, round, and reactive to light.  Cardiovascular:     Rate and Rhythm: Regular rhythm. Tachycardia present.     Pulses: Normal pulses.     Heart sounds: Normal heart sounds.  Pulmonary:     Effort: Pulmonary effort is normal.     Breath sounds: Normal breath sounds.  Abdominal:     General: Abdomen is flat.     Palpations: Abdomen is soft.  Musculoskeletal:        General: Normal range of motion.     Cervical back: Normal range of motion.     Right lower leg: No edema.     Left lower leg: No edema.  Skin:    General: Skin is warm and dry.  Neurological:     General: No focal deficit present.     Mental Status: He is alert. Mental status is at baseline.  Psychiatric:        Mood and Affect: Mood normal.     Arrived for  home visit for Jeremy Powers who was seated on the couch alert and oriented reporting he was feeling good with no complaints. Jeremy Powers stated he has been doing well with no reports of dizziness, chest pain or shortness of breath, swelling. Lung sounds clear, no JVD. I obtained vitals and we reviewed medications. I filled pill box accordingly. Jeremy Powers agreed to home visit one week. No refills needed.    Appointment on 2/25 needing transportation for same. I will discuss with [place] and Zack on further.   Future Appointments  Date Time Provider Department Center  07/15/2020 10:00 AM [facility] OP 1 [facility]  07/15/2020 11:00 AM Bensimhon, Bevelyn Buckles, MD [facility] None  09/06/2020  9:40 AM Saguier, Kateri Mc [facility]     ACTION: Home visit completed Next visit planned for one week

## 2020-07-05 ENCOUNTER — Telehealth (HOSPITAL_COMMUNITY): Payer: Self-pay

## 2020-07-05 NOTE — Telephone Encounter (Signed)
Attempted to reach Jeremy Powers for confirmation of home visit tomorrow at 1230. I will continue to reach out and be at the home at 1230.

## 2020-07-06 ENCOUNTER — Other Ambulatory Visit (HOSPITAL_COMMUNITY): Payer: Medicare Other

## 2020-07-06 ENCOUNTER — Other Ambulatory Visit (HOSPITAL_COMMUNITY): Payer: Self-pay

## 2020-07-06 ENCOUNTER — Encounter (HOSPITAL_COMMUNITY): Payer: Medicare Other | Admitting: Internal Medicine

## 2020-07-06 NOTE — Progress Notes (Signed)
Paramedicine Encounter    Patient ID: Jeremy Powers, male    DOB: 19-Oct-1955, 65 y.o.   MRN: [id number]   Patient Care Team: [name] as PCP - General (Internal Medicine)  Patient Active Problem List   Diagnosis Date Noted  . Acute systolic (congestive) heart failure (HCC) 06/24/2018  . Aortic insufficiency 06/22/2018  . Acute on chronic congestive heart failure (HCC) 06/22/2018  . Elevated troponin 06/22/2018  . CAD (coronary artery disease) 06/22/2018  . Left bundle branch block 06/22/2018  . History of iron deficiency anemia 06/22/2018  . Acute on chronic heart failure (HCC) 06/22/2018    Current Outpatient Medications:  .  albuterol (VENTOLIN HFA) 108 (90 Base) MCG/ACT inhaler, Inhale 2 puffs into the lungs every 6 (six) hours as needed for wheezing or shortness of breath., Disp: 18 g, Rfl: 1 .  aspirin EC 81 MG tablet, Take 81 mg by mouth daily., Disp: , Rfl:  .  atorvastatin (LIPITOR) 40 MG tablet, TAKE 1 TABLET BY MOUTH DAILY AT 6PM, Disp: 30 tablet, Rfl: 5 .  carvedilol (COREG) 3.125 MG tablet, Take 1 tablet (3.125 mg total) by mouth 2 (two) times daily with a meal., Disp: 180 tablet, Rfl: 3 .  dapagliflozin propanediol (FARXIGA) 10 MG TABS tablet, Take 1 tablet (10 mg total) by mouth daily before breakfast., Disp: 30 tablet, Rfl: 3 .  FEROSUL 325 (65 Fe) MG tablet, TAKE 1 TABLET BY MOUTH TWICE DAILY WITH A MEAL, Disp: 180 tablet, Rfl: 3 .  potassium chloride SA (KLOR-CON) 20 MEQ tablet, Take 2 tablets (40 mEq total) by mouth 2 (two) times daily., Disp: 120 tablet, Rfl: 3 .  sacubitril-valsartan (ENTRESTO) 24-26 MG, Take 1 tablet by mouth 2 (two) times daily., Disp: 60 tablet, Rfl: 3 .  sertraline (ZOLOFT) 25 MG tablet, Take 1 tablet (25 mg total) by mouth daily., Disp: 30 tablet, Rfl: 5 .  spironolactone (ALDACTONE) 25 MG tablet, TAKE 1 TABLET BY MOUTH EACH NIGHT AT BEDTIME, Disp: 30 tablet, Rfl: 2 No Known Allergies   Social History   Socioeconomic History   . Marital status: Single    Spouse name: Not on file  . Number of children: Not on file  . Years of education: Not on file  . Highest education level: Not on file  Occupational History  . Not on file  Tobacco Use  . Smoking status: Former Smoker    Packs/day: 0.50    Types: Cigarettes  . Smokeless tobacco: Never Used  Vaping Use  . Vaping Use: Never used  Substance and Sexual Activity  . Alcohol use: Yes    Comment: daily  . Drug use: Yes    Types: Marijuana  . Sexual activity: Not on file  Other Topics Concern  . Not on file  Social History Narrative  . Not on file   Social Determinants of Health   Financial Resource Strain: Not on file  Food Insecurity: Not on file  Transportation Needs: Not on file  Physical Activity: Not on file  Stress: Not on file  Social Connections: Not on file  Intimate Partner Violence: Not on file    Physical Exam Constitutional:      Appearance: He is normal weight.  HENT:     Head: Normocephalic.     Nose: Nose normal. No congestion or rhinorrhea.     Mouth/Throat:     Mouth: Mucous membranes are moist.     Pharynx: Oropharynx is clear.  Eyes:  Pupils: Pupils are equal, round, and reactive to light.  Cardiovascular:     Rate and Rhythm: Regular rhythm. Tachycardia present.     Pulses: Normal pulses.     Heart sounds: Normal heart sounds.  Pulmonary:     Effort: Pulmonary effort is normal.     Breath sounds: Normal breath sounds.  Abdominal:     General: Abdomen is flat.     Palpations: Abdomen is soft.  Musculoskeletal:        General: Normal range of motion.     Cervical back: Normal range of motion.     Right lower leg: No edema.     Left lower leg: No edema.  Skin:    General: Skin is warm and dry.     Capillary Refill: Capillary refill takes less than 2 seconds.  Neurological:     General: No focal deficit present.     Mental Status: He is alert. Mental status is at baseline.  Psychiatric:        Mood and  Affect: Mood normal.      Arrived for home visit for Jeremy Powers who was alert and oriented seated on the couch. Jeremy Powers reports feeling okay today but when looking at his pillbox he hadn't taken any of his medications. He admitted he hasn't taken any of them this past week because he reports they make him "sick". He said, "after taking them I throw up". However, there were no medications taken over the last week and he did not report any of this last week at our home visit. I advised Jeremy Powers to be sure is taking his medications and if he is still feeling sick after taking them to inform me so we can inform the doctor. He agreed with plan and stated he would do better this week. I assessed vitals and physical assessment. Vitals as noted, slight increased heart rate with a regular rhythm. No edema, lung sounds clear with no JVD. I set up transportation with [facility] for appointment for [facility]. Pick up from home at 09:05, Jeremy Powers given written instructions on same and to bring medications and pill box to visit. He agreed with plan. Jeremy Powers at the home also informed of same. I will follow up with patient as needed. Home visit complete.   Refills- NONE      Future Appointments  Date Time Provider Department Center  07/15/2020 10:00 AM [facility]  07/15/2020 11:00 AM Bensimhon, Bevelyn Buckles, MD [facility] None  09/06/2020  9:40 AM Saguier, Kateri Mc [facility]     ACTION: Home visit completed Next visit planned for one to two weeks

## 2020-07-14 NOTE — Progress Notes (Signed)
PCP: [name], PA-C Primary Cardiologist: Dr [name]  HPI: Jeremy Powers is a 65 y.o. male CAD s/p PCI to LAD in 2015 and 2016, s/p CABG 2017, systolic HF EF 20-25%, microcytic/iron deficiency anemia followed by hematology and GI, LBBB, and tobacco use. Illiterate and cannot read.   In 2/20 he presented to [facility] ED with SOB, cough, and orthopnea in setting of med non-compliance. Was unable to afford copays with Medicaid. He was tachycardic and tachypneic on arrival, and was transferred to [facility]2/2/20for further evaluation. Echo performed which showed newly decreased EF of 25-30% from 50-55% in 12/2017. Taken for [facility] which showed severe 1 v disease with occluded LAD within previous stent. RCA and LCX normal. LIMA to LAD and SVG to D2 patent. Cath also showed low filling pressures with moderately low cardiac output. Meds adjusted as tolerated.  OV 04/05/20 returns for HF follow up after not being seen in > 1 year. Lives with his girlfriend. Followed by HF Paramedicine. Lasix was stopped & [name] was started.  Today he returns for HF follow up. Says he feels good. Does all ADLs without problem. Gets SOB if he tries to go to store or do more. No edema, orthopnea or PND. Not smoking any more.Taking all meds.   Echo today 07/15/20: EF 20-25% RV ok Personally reviewed   Echo 01/2019: EF 20-25%  Echo 06/2018: EF 20-25%  cMRI 06/27/18: EF 24%. Normal RV  RHC/LHC 06/24/2018 RA = 1 RV = 19/3 PA = 24/5 (14) PCW = 7 Fick cardiac output/index = 4.0/2.5 PVR = 1.9 WU Ao sat = 95% PA sat = 68%, 71%  Assessment: 1. Severe 1-vessel CAD with occluded LAD within previous stent 2. Normal RCA and LCX 3. LIMA to LAD widely patent 4. SVG - D2 widely patent 5. SVG - D1 likely occluded (grafts not marked)   ROS: All systems negative except as listed in HPI, PMH and Problem List.  SH:  Social History   Socioeconomic History  . Marital status: Single    Spouse name: Not on file  . Number of  children: Not on file  . Years of education: Not on file  . Highest education level: Not on file  Occupational History  . Not on file  Tobacco Use  . Smoking status: Former Smoker    Packs/day: 0.50    Types: Cigarettes  . Smokeless tobacco: Never Used  Vaping Use  . Vaping Use: Never used  Substance and Sexual Activity  . Alcohol use: Yes    Comment: daily  . Drug use: Yes    Types: Marijuana  . Sexual activity: Not on file  Other Topics Concern  . Not on file  Social History Narrative  . Not on file   Social Determinants of Health   Financial Resource Strain: Not on file  Food Insecurity: Not on file  Transportation Needs: Not on file  Physical Activity: Not on file  Stress: Not on file  Social Connections: Not on file  Intimate Partner Violence: Not on file    FH: No family history on file.  Past Medical History:  Diagnosis Date  . Cirrhosis (HCC)   . Coronary artery disease   . Hypertension   . Pancreatitis     Current Outpatient Medications  Medication Sig Dispense Refill  . albuterol (VENTOLIN HFA) 108 (90 Base) MCG/ACT inhaler Inhale 2 puffs into the lungs every 6 (six) hours as needed for wheezing or shortness of breath. 18 g 1  .  aspirin EC 81 MG tablet Take 81 mg by mouth daily.    Marland Kitchen atorvastatin (LIPITOR) 40 MG tablet TAKE 1 TABLET BY MOUTH DAILY AT 6PM 30 tablet 5  . carvedilol (COREG) 3.125 MG tablet Take 1 tablet (3.125 mg total) by mouth 2 (two) times daily with a meal. 180 tablet 3  . dapagliflozin propanediol (FARXIGA) 10 MG TABS tablet Take 1 tablet (10 mg total) by mouth daily before breakfast. 30 tablet 3  . FEROSUL 325 (65 Fe) MG tablet TAKE 1 TABLET BY MOUTH TWICE DAILY WITH A MEAL 180 tablet 3  . potassium chloride SA (KLOR-CON) 20 MEQ tablet Take 2 tablets (40 mEq total) by mouth 2 (two) times daily. 120 tablet 3  . sacubitril-valsartan (ENTRESTO) 24-26 MG Take 1 tablet by mouth 2 (two) times daily. 60 tablet 3  . sertraline (ZOLOFT) 25 MG  tablet Take 1 tablet (25 mg total) by mouth daily. 30 tablet 5  . spironolactone (ALDACTONE) 25 MG tablet TAKE 1 TABLET BY MOUTH EACH NIGHT AT BEDTIME 30 tablet 2   No current facility-administered medications for this encounter.    Vitals:   07/15/20 1108  BP: 140/78  Pulse: 72  SpO2: 99%  Weight: 55.2 kg (121 lb 9.6 oz)    Wt Readings from Last 3 Encounters:  07/15/20 55.2 kg (121 lb 9.6 oz)  07/06/20 54.3 kg (119 lb 12.8 oz)  06/29/20 53.5 kg (118 lb)   ECG: SR 78  LBBB Personally reviewed   PHYSICAL EXAM: General:  Thin, NAD. No resp difficulty HEENT: normal Neck: supple. no JVD. Carotids 2+ bilat; no bruits. No lymphadenopathy or thryomegaly appreciated. Cor: PMI nondisplaced. Regular rate & rhythm. No rubs, gallops or murmurs. Lungs: clear Abdomen: soft, nontender, nondistended. No hepatosplenomegaly. No bruits or masses. Good bowel sounds. Extremities: no cyanosis, clubbing, rash, edema Neuro: alert & orientedx3, cranial nerves grossly intact. moves all 4 extremities w/o difficulty. Affect pleasant    ASSESSMENT & PLAN: 1.Chronic Systolic Heart Failure, NICM - Echo 06/2018 EF 25-30%,  cMRI 06/27/2018 with EF 24%, Echo 01/2019 EF 20-25% - Echo today 07/15/20 EF 20-25% Personally reviewed  - NYHA II-III. - Volume status ok - Continue Farxiga 10 - Continue carvedilol to 6.25 mg twice a day.  - Increase Entresto 49/51 mg bid - Continue spironolactone to 25 mg daily at bedtime.  - Refer for CRT-D with wide LBBB - Continue with Paramedicine.   2. CAD - S/P CABG 2017, LHC 06/2018  with occluded LAD normal LCX and RCA. LIMA to LAD and SVG to D2 patent (SVG to D1 occluded) - No s/s ischemia - Continue aspirin 81 mg daily + atorvastatin.     3. LBBB - EKG QRS 170 ms.  - Refer for CRT-D with wide LBBB   4.  Tobacco Abuse - No longer smoking. Lives with people who do smoke. - No change  5. HTN - Slightly elevated today  - Increase Entresto to 49/51  bid  6. Illiterate - Cannot read. Able to write numbers. Needs assistance with medications. Now has help putting pills in pill box   07/14/20 10:50 PM

## 2020-07-15 ENCOUNTER — Ambulatory Visit (HOSPITAL_BASED_OUTPATIENT_CLINIC_OR_DEPARTMENT_OTHER)
Admission: RE | Admit: 2020-07-15 | Discharge: 2020-07-15 | Disposition: A | Discharge status: Home / Self Care | Payer: Medicare Other | Source: Ambulatory Visit | Attending: Internal Medicine | Admitting: Internal Medicine

## 2020-07-15 ENCOUNTER — Encounter (HOSPITAL_COMMUNITY): Payer: Self-pay | Admitting: Internal Medicine

## 2020-07-15 ENCOUNTER — Other Ambulatory Visit: Payer: Self-pay

## 2020-07-15 ENCOUNTER — Other Ambulatory Visit (HOSPITAL_COMMUNITY): Payer: Self-pay | Admitting: Internal Medicine

## 2020-07-15 ENCOUNTER — Ambulatory Visit (HOSPITAL_COMMUNITY)
Admission: RE | Admit: 2020-07-15 | Discharge: 2020-07-15 | Disposition: A | Discharge status: Home / Self Care | Payer: Medicare Other | Source: Ambulatory Visit | Attending: Internal Medicine | Admitting: Internal Medicine

## 2020-07-15 VITALS — BP 140/78 | HR 72 | Wt 121.6 lb

## 2020-07-15 DIAGNOSIS — I447 Left bundle-branch block, unspecified: Secondary | ICD-10-CM | POA: Diagnosis not present

## 2020-07-15 DIAGNOSIS — Z955 Presence of coronary angioplasty implant and graft: Secondary | ICD-10-CM | POA: Insufficient documentation

## 2020-07-15 DIAGNOSIS — I110 Hypertensive heart disease with heart failure: Secondary | ICD-10-CM | POA: Insufficient documentation

## 2020-07-15 DIAGNOSIS — Z550 Illiteracy and low-level literacy: Secondary | ICD-10-CM | POA: Insufficient documentation

## 2020-07-15 DIAGNOSIS — Z7982 Long term (current) use of aspirin: Secondary | ICD-10-CM | POA: Diagnosis not present

## 2020-07-15 DIAGNOSIS — Z951 Presence of aortocoronary bypass graft: Secondary | ICD-10-CM | POA: Insufficient documentation

## 2020-07-15 DIAGNOSIS — I351 Nonrheumatic aortic (valve) insufficiency: Secondary | ICD-10-CM | POA: Insufficient documentation

## 2020-07-15 DIAGNOSIS — I10 Essential (primary) hypertension: Secondary | ICD-10-CM | POA: Diagnosis not present

## 2020-07-15 DIAGNOSIS — I5022 Chronic systolic (congestive) heart failure: Secondary | ICD-10-CM | POA: Diagnosis not present

## 2020-07-15 DIAGNOSIS — Z79899 Other long term (current) drug therapy: Secondary | ICD-10-CM | POA: Diagnosis not present

## 2020-07-15 DIAGNOSIS — I428 Other cardiomyopathies: Secondary | ICD-10-CM | POA: Insufficient documentation

## 2020-07-15 DIAGNOSIS — Z7984 Long term (current) use of oral hypoglycemic drugs: Secondary | ICD-10-CM | POA: Insufficient documentation

## 2020-07-15 DIAGNOSIS — Z87891 Personal history of nicotine dependence: Secondary | ICD-10-CM | POA: Diagnosis not present

## 2020-07-15 DIAGNOSIS — I2510 Atherosclerotic heart disease of native coronary artery without angina pectoris: Secondary | ICD-10-CM

## 2020-07-15 HISTORY — DX: Heart failure, unspecified: I50.9

## 2020-07-15 LAB — ECHOCARDIOGRAM COMPLETE
Area-P 1/2: 3.99 cm2
Calc EF: 26 %
P 1/2 time: 288 msec
S' Lateral: 4.6 cm
Single Plane A2C EF: 20.8 %
Single Plane A4C EF: 25.7 %

## 2020-07-15 LAB — CBC
HCT: 31.5 % — ABNORMAL LOW (ref 39.0–52.0)
Hemoglobin: 10.8 g/dL — ABNORMAL LOW (ref 13.0–17.0)
MCH: 33 pg (ref 26.0–34.0)
MCHC: 34.3 g/dL (ref 30.0–36.0)
MCV: 96.3 fL (ref 80.0–100.0)
Platelets: 257 10*3/uL (ref 150–400)
RBC: 3.27 MIL/uL — ABNORMAL LOW (ref 4.22–5.81)
RDW: 14.7 % (ref 11.5–15.5)
WBC: 4.8 10*3/uL (ref 4.0–10.5)
nRBC: 0 % (ref 0.0–0.2)

## 2020-07-15 LAB — BASIC METABOLIC PANEL
Anion gap: 11 (ref 5–15)
BUN: <5 mg/dL — ABNORMAL LOW (ref 8–23)
CO2: 24 mmol/L (ref 22–32)
Calcium: 8.4 mg/dL — ABNORMAL LOW (ref 8.9–10.3)
Chloride: 103 mmol/L (ref 98–111)
Creatinine, Ser: 0.8 mg/dL (ref 0.61–1.24)
GFR, Estimated: >60 mL/min (ref >60)
Glucose, Bld: 100 mg/dL — ABNORMAL HIGH (ref 70–99)
Potassium: 3.6 mmol/L (ref 3.5–5.1)
Sodium: 138 mmol/L (ref 135–145)

## 2020-07-15 LAB — BRAIN NATRIURETIC PEPTIDE: B Natriuretic Peptide: 93 pg/mL (ref 0.0–100.0)

## 2020-07-15 MED ORDER — ENTRESTO 49-51 MG PO TABS: 1.0000 | ORAL_TABLET | Freq: Two times a day (BID) | ORAL | 5 refills | Status: DC

## 2020-07-15 MED FILL — ENTRESTO 49 MG-51 MG TABLET: 49-51 | 30 days supply | Qty: 60 | Fill #0

## 2020-07-15 NOTE — Patient Instructions (Signed)
Increase Entresto to 49/51( 1 tablet) Twice daily  Lab work done today we will call you with any abnormal results  You have been referred to Dr. Graciela Husbands  Your physician recommends that you schedule a follow-up appointment in: 3 months  If you have any questions or concerns before your next appointment please send [place] a message through [place] or call our office at [phone].    TO LEAVE A MESSAGE FOR THE NURSE SELECT OPTION 2, PLEASE LEAVE A MESSAGE INCLUDING: . YOUR NAME . DATE OF BIRTH . CALL BACK NUMBER . REASON FOR CALL**this is important as we prioritize the call backs  YOU WILL RECEIVE A CALL BACK THE SAME DAY AS LONG AS YOU CALL BEFORE 4:00 PM  At the [facility], you and your health needs are our priority. As part of our continuing mission to provide you with exceptional heart care, we have created designated Provider Care Teams. These Care Teams include your primary Cardiologist (physician) and Advanced Practice Providers (APPs- Physician Assistants and Nurse Practitioners) who all work together to provide you with the care you need, when you need it.   You may see any of the following providers on your designated Care Team at your next follow up: Marland Kitchen Dr Arvilla Meres . Dr Marca Ancona . Dr Thornell Mule . Tonye Becket, NP . Robbie Lis, PA . Shanda Bumps [place],NP . Karle Plumber, PharmD   Please be sure to bring in all your medications bottles to every appointment.

## 2020-07-20 ENCOUNTER — Other Ambulatory Visit (HOSPITAL_COMMUNITY): Payer: Self-pay

## 2020-07-20 NOTE — Progress Notes (Signed)
Paramedicine Encounter    Patient ID: Jeremy Powers, male    DOB: [birth year], 65 y.o.   MRN: [id number]  Arrived for home visit for Jeremy Powers for a medication check today. Jeremy Powers was alert and reporting he was feeling much better this week and reports he has been taking his medicines. Pill box was full, he reports his friend who is a CNA filled it for him. I checked same for accuracy and it was correct except new updated dose of 49-51 Entresto in which I doubled his 24-46 dose to create the new dose. I will call [facility] to verify they have the new dose and will be sending it out. Jeremy Powers was encouraged to weigh daily and I would be out next week to see him for a full visit. Jeremy Powers agreed with plan. Visit complete.   Patient was referred to CRT-D I will follow up on same.     ACTION: Home visit completed Next visit planned for one week

## 2020-07-22 ENCOUNTER — Other Ambulatory Visit (HOSPITAL_COMMUNITY): Payer: Self-pay | Admitting: Adult Health

## 2020-07-27 ENCOUNTER — Other Ambulatory Visit (HOSPITAL_COMMUNITY): Payer: Self-pay

## 2020-07-27 NOTE — Progress Notes (Signed)
Paramedicine Encounter    Patient ID: Jeremy Powers, male    DOB: 25-Apr-1956, 65 y.o.   MRN: [id number]   Patient Care Team: [name] as PCP - General (Internal Medicine)  Patient Active Problem List   Diagnosis Date Noted  . Acute systolic (congestive) heart failure (HCC) 06/24/2018  . Aortic insufficiency 06/22/2018  . Acute on chronic congestive heart failure (HCC) 06/22/2018  . Elevated troponin 06/22/2018  . CAD (coronary artery disease) 06/22/2018  . Left bundle branch block 06/22/2018  . History of iron deficiency anemia 06/22/2018  . Acute on chronic heart failure (HCC) 06/22/2018    Current Outpatient Medications:  .  aspirin EC 81 MG tablet, Take 81 mg by mouth daily., Disp: , Rfl:  .  atorvastatin (LIPITOR) 40 MG tablet, TAKE 1 TABLET BY MOUTH DAILY AT 6PM, Disp: 30 tablet, Rfl: 5 .  carvedilol (COREG) 3.125 MG tablet, Take 1 tablet (3.125 mg total) by mouth 2 (two) times daily with a meal., Disp: 180 tablet, Rfl: 3 .  FARXIGA 10 MG TABS tablet, TAKE 1 TABLET BY MOUTH EVERY MORNING BEFORE BREAKFAST, Disp: 30 tablet, Rfl: 3 .  FEROSUL 325 (65 Fe) MG tablet, TAKE 1 TABLET BY MOUTH TWICE DAILY WITH A MEAL, Disp: 180 tablet, Rfl: 3 .  potassium chloride SA (KLOR-CON) 20 MEQ tablet, Take 2 tablets (40 mEq total) by mouth 2 (two) times daily., Disp: 120 tablet, Rfl: 3 .  sacubitril-valsartan (ENTRESTO) 49-51 MG, Take 1 tablet by mouth 2 (two) times daily., Disp: 60 tablet, Rfl: 5 .  sertraline (ZOLOFT) 25 MG tablet, Take 1 tablet (25 mg total) by mouth daily., Disp: 30 tablet, Rfl: 5 .  spironolactone (ALDACTONE) 25 MG tablet, TAKE 1 TABLET BY MOUTH EACH NIGHT AT BEDTIME, Disp: 30 tablet, Rfl: 2 No Known Allergies   Social History   Socioeconomic History  . Marital status: Single    Spouse name: Not on file  . Number of children: Not on file  . Years of education: Not on file  . Highest education level: Not on file  Occupational History  . Not on file   Tobacco Use  . Smoking status: Former Smoker    Packs/day: 0.50    Types: Cigarettes  . Smokeless tobacco: Never Used  Vaping Use  . Vaping Use: Never used  Substance and Sexual Activity  . Alcohol use: Yes    Comment: daily  . Drug use: Yes    Types: Marijuana  . Sexual activity: Not on file  Other Topics Concern  . Not on file  Social History Narrative  . Not on file   Social Determinants of Health   Financial Resource Strain: Not on file  Food Insecurity: Not on file  Transportation Needs: Not on file  Physical Activity: Not on file  Stress: Not on file  Social Connections: Not on file  Intimate Partner Violence: Not on file    Physical Exam Vitals reviewed.  Constitutional:      Appearance: Normal appearance. He is normal weight.  HENT:     Head: Normocephalic.     Nose: Nose normal.     Mouth/Throat:     Mouth: Mucous membranes are moist.     Pharynx: Oropharynx is clear.  Eyes:     Conjunctiva/sclera: Conjunctivae normal.     Pupils: Pupils are equal, round, and reactive to light.  Cardiovascular:     Rate and Rhythm: Normal rate and regular rhythm.     Pulses: Normal  pulses.     Heart sounds: Normal heart sounds.  Pulmonary:     Effort: Pulmonary effort is normal.     Breath sounds: Normal breath sounds.  Abdominal:     General: Abdomen is flat.     Palpations: Abdomen is soft.  Musculoskeletal:        General: Normal range of motion.     Cervical back: Normal range of motion.     Right lower leg: No edema.     Left lower leg: No edema.  Skin:    General: Skin is warm and dry.     Capillary Refill: Capillary refill takes less than 2 seconds.  Neurological:     General: No focal deficit present.     Mental Status: He is alert. Mental status is at baseline.  Psychiatric:        Mood and Affect: Mood normal.     Arrived for home visit for [place] who was alert and oriented reporting he was feeling good and has been taking his medicines. I  reviewed pillbox and it was empty meaning he took his prescribed medications. I obtained vitals and they are as noted. Quintel denied dizziness, shortness of breath, or trouble sleeping or walking around. I reviewed medications and confirmed same filling pill box accordingly. Xai agreed to visit in one week. No edema noted, lung sounds clear. Home visit complete.         Future Appointments  Date Time Provider Department Center  09/06/2020  9:40 AM Saguier, Kateri Mc [facility]  10/12/2020 11:30 AM [facility] PA/NP [facility] None     ACTION: Home visit completed Next visit planned for one wee

## 2020-08-04 ENCOUNTER — Other Ambulatory Visit (HOSPITAL_COMMUNITY): Payer: Self-pay

## 2020-08-04 NOTE — Progress Notes (Signed)
Paramedicine Encounter    Patient ID: Jeremy Powers, male    DOB: 25-Apr-1956, 65 y.o.   MRN: [id number]   Patient Care Team: [name] as PCP - General (Internal Medicine)  Patient Active Problem List   Diagnosis Date Noted  . Acute systolic (congestive) heart failure (HCC) 06/24/2018  . Aortic insufficiency 06/22/2018  . Acute on chronic congestive heart failure (HCC) 06/22/2018  . Elevated troponin 06/22/2018  . CAD (coronary artery disease) 06/22/2018  . Left bundle branch block 06/22/2018  . History of iron deficiency anemia 06/22/2018  . Acute on chronic heart failure (HCC) 06/22/2018    Current Outpatient Medications:  .  aspirin EC 81 MG tablet, Take 81 mg by mouth daily., Disp: , Rfl:  .  atorvastatin (LIPITOR) 40 MG tablet, TAKE 1 TABLET BY MOUTH DAILY AT 6PM, Disp: 30 tablet, Rfl: 5 .  carvedilol (COREG) 3.125 MG tablet, Take 1 tablet (3.125 mg total) by mouth 2 (two) times daily with a meal., Disp: 180 tablet, Rfl: 3 .  FARXIGA 10 MG TABS tablet, TAKE 1 TABLET BY MOUTH EVERY MORNING BEFORE BREAKFAST, Disp: 30 tablet, Rfl: 3 .  FEROSUL 325 (65 Fe) MG tablet, TAKE 1 TABLET BY MOUTH TWICE DAILY WITH A MEAL, Disp: 180 tablet, Rfl: 3 .  potassium chloride SA (KLOR-CON) 20 MEQ tablet, Take 2 tablets (40 mEq total) by mouth 2 (two) times daily., Disp: 120 tablet, Rfl: 3 .  sacubitril-valsartan (ENTRESTO) 49-51 MG, Take 1 tablet by mouth 2 (two) times daily., Disp: 60 tablet, Rfl: 5 .  sertraline (ZOLOFT) 25 MG tablet, Take 1 tablet (25 mg total) by mouth daily., Disp: 30 tablet, Rfl: 5 .  spironolactone (ALDACTONE) 25 MG tablet, TAKE 1 TABLET BY MOUTH EACH NIGHT AT BEDTIME, Disp: 30 tablet, Rfl: 2 No Known Allergies   Social History   Socioeconomic History  . Marital status: Single    Spouse name: Not on file  . Number of children: Not on file  . Years of education: Not on file  . Highest education level: Not on file  Occupational History  . Not on file   Tobacco Use  . Smoking status: Former Smoker    Packs/day: 0.50    Types: Cigarettes  . Smokeless tobacco: Never Used  Vaping Use  . Vaping Use: Never used  Substance and Sexual Activity  . Alcohol use: Yes    Comment: daily  . Drug use: Yes    Types: Marijuana  . Sexual activity: Not on file  Other Topics Concern  . Not on file  Social History Narrative  . Not on file   Social Determinants of Health   Financial Resource Strain: Not on file  Food Insecurity: Not on file  Transportation Needs: Not on file  Physical Activity: Not on file  Stress: Not on file  Social Connections: Not on file  Intimate Partner Violence: Not on file    Physical Exam Vitals reviewed.  Constitutional:      Appearance: Normal appearance. He is normal weight.  HENT:     Head: Normocephalic.     Nose: Nose normal.     Mouth/Throat:     Mouth: Mucous membranes are moist.     Pharynx: Oropharynx is clear.  Eyes:     Conjunctiva/sclera: Conjunctivae normal.     Pupils: Pupils are equal, round, and reactive to light.  Cardiovascular:     Rate and Rhythm: Normal rate and regular rhythm.     Pulses: Normal  pulses.     Heart sounds: Normal heart sounds.  Pulmonary:     Effort: Pulmonary effort is normal.     Breath sounds: Normal breath sounds.  Abdominal:     General: Abdomen is flat.     Palpations: Abdomen is soft.  Musculoskeletal:        General: Normal range of motion.     Cervical back: Normal range of motion.     Right lower leg: No edema.     Left lower leg: No edema.  Skin:    General: Skin is warm and dry.     Capillary Refill: Capillary refill takes less than 2 seconds.  Neurological:     General: No focal deficit present.     Mental Status: He is alert. Mental status is at baseline.  Psychiatric:        Mood and Affect: Mood normal.     Arrived for home visit with Jeremy Powers where he was alert and oriented reporting to be feeling really good. Eduin was compliant with his  medications over the last week. Pill box was empty. I reviewed medications and gave him his morning dose of meds. I filled one pill box accordingly.   I spoke to Jeremy Powers about moving him to bubble packs at [facility] and the plan for discharge in a few weeks. He agreed with plan and was understanding.   I took all medications remaining from [facility] to [facility] where they are going to complete his bubble packs and deliver them. I plan to come back out in a week to confirm bubble packs and officially discharge.   Home visit complete.        Future Appointments  Date Time Provider Department Center  09/06/2020  9:40 AM Saguier, Kateri Mc [facility]  10/12/2020 11:30 AM [facility] PA/NP [facility] None     ACTION: Home visit completed Next visit planned for one-two weeks

## 2020-08-11 ENCOUNTER — Telehealth (HOSPITAL_COMMUNITY): Payer: Self-pay

## 2020-08-11 NOTE — Telephone Encounter (Signed)
Spoke to [place] who reports he received his bubble packs and we confirmed medications in same with [place] on the line. Tabitha understands if he needs any further to reach out to myself or clinic but he is officially graduated from [employer]. Jeremy Powers agreed with plan and was grateful for our assistance. Call complete.   Patient is now discharged from [facility].  Patient has/has not met the following goals:  Yes :Patient expresses basic understanding of medications and what they are for Yes :Patient able to verbalize heart failure specific dietary/fluid restrictions Yes :Patient is aware of who to call if they have medical concerns or if they need to schedule or change appts Yes :Patient has a scale for daily weights and weighs regularly Yes :Patient able to verbalize concerning symptoms when they should call the HF clinic (weight gain ranges, etc) Yes :Patient has a PCP and has seen within the past year or has upcoming appt Yes :Patient has reliable access to getting their medications Yes :Patient has shown they are able to reorder medications reliably Yes :Patient has had admission in past 30 days- if yes how many? No :Patient has had admission in past 90 days- if yes how many?  Discharge Comments:

## 2020-09-06 ENCOUNTER — Ambulatory Visit: Payer: Medicare Other | Admitting: Medical

## 2020-09-20 ENCOUNTER — Other Ambulatory Visit: Payer: Self-pay | Admitting: Cardiology

## 2020-09-29 ENCOUNTER — Other Ambulatory Visit: Payer: Self-pay | Admitting: Cardiology

## 2020-10-03 ENCOUNTER — Other Ambulatory Visit: Payer: Self-pay | Admitting: Cardiology

## 2020-10-12 ENCOUNTER — Encounter (HOSPITAL_COMMUNITY): Payer: Self-pay

## 2020-10-12 ENCOUNTER — Ambulatory Visit (HOSPITAL_COMMUNITY)
Admission: RE | Admit: 2020-10-12 | Discharge: 2020-10-12 | Disposition: A | Discharge status: Home / Self Care | Payer: Medicare Other | Source: Ambulatory Visit | Attending: Cardiology | Admitting: Cardiology

## 2020-10-12 ENCOUNTER — Other Ambulatory Visit: Payer: Self-pay

## 2020-10-12 VITALS — BP 152/86 | HR 91 | Wt 129.8 lb

## 2020-10-12 DIAGNOSIS — Z9114 Patient's other noncompliance with medication regimen: Secondary | ICD-10-CM | POA: Diagnosis not present

## 2020-10-12 DIAGNOSIS — I447 Left bundle-branch block, unspecified: Secondary | ICD-10-CM | POA: Insufficient documentation

## 2020-10-12 DIAGNOSIS — Z7901 Long term (current) use of anticoagulants: Secondary | ICD-10-CM | POA: Diagnosis not present

## 2020-10-12 DIAGNOSIS — Z951 Presence of aortocoronary bypass graft: Secondary | ICD-10-CM | POA: Insufficient documentation

## 2020-10-12 DIAGNOSIS — Z79899 Other long term (current) drug therapy: Secondary | ICD-10-CM | POA: Diagnosis not present

## 2020-10-12 DIAGNOSIS — Z7982 Long term (current) use of aspirin: Secondary | ICD-10-CM | POA: Diagnosis not present

## 2020-10-12 DIAGNOSIS — I110 Hypertensive heart disease with heart failure: Secondary | ICD-10-CM | POA: Diagnosis not present

## 2020-10-12 DIAGNOSIS — I5022 Chronic systolic (congestive) heart failure: Secondary | ICD-10-CM | POA: Insufficient documentation

## 2020-10-12 DIAGNOSIS — Z955 Presence of coronary angioplasty implant and graft: Secondary | ICD-10-CM | POA: Insufficient documentation

## 2020-10-12 DIAGNOSIS — I428 Other cardiomyopathies: Secondary | ICD-10-CM | POA: Insufficient documentation

## 2020-10-12 DIAGNOSIS — I2510 Atherosclerotic heart disease of native coronary artery without angina pectoris: Secondary | ICD-10-CM | POA: Diagnosis not present

## 2020-10-12 DIAGNOSIS — R0609 Other forms of dyspnea: Secondary | ICD-10-CM | POA: Insufficient documentation

## 2020-10-12 DIAGNOSIS — Z87891 Personal history of nicotine dependence: Secondary | ICD-10-CM | POA: Insufficient documentation

## 2020-10-12 LAB — BASIC METABOLIC PANEL
Anion gap: 11 (ref 5–15)
BUN: <5 mg/dL — ABNORMAL LOW (ref 8–23)
CO2: 24 mmol/L (ref 22–32)
Calcium: 8.1 mg/dL — ABNORMAL LOW (ref 8.9–10.3)
Chloride: 105 mmol/L (ref 98–111)
Creatinine, Ser: 0.75 mg/dL (ref 0.61–1.24)
GFR, Estimated: >60 mL/min (ref >60)
Glucose, Bld: 91 mg/dL (ref 70–99)
Potassium: 3 mmol/L — ABNORMAL LOW (ref 3.5–5.1)
Sodium: 140 mmol/L (ref 135–145)

## 2020-10-12 LAB — BRAIN NATRIURETIC PEPTIDE: B Natriuretic Peptide: >4500 pg/mL — ABNORMAL HIGH (ref 0.0–100.0)

## 2020-10-12 MED ORDER — ENTRESTO 49-51 MG PO TABS: 1.0000 | ORAL_TABLET | Freq: Two times a day (BID) | ORAL | 5 refills | Status: DC

## 2020-10-12 NOTE — Progress Notes (Signed)
ReDS Vest / Clip - 10/12/20 1100      ReDS Vest / Clip   Station Marker D    Ruler Value 28    Anatomical Comments sitting

## 2020-10-12 NOTE — Progress Notes (Signed)
PCP: [name], PA-C Primary Cardiologist: Dr [name]  Reason for Visit: f/u for chronic systolic heart failure   HPI: Jeremy Powers is a 65 y.o. male CAD s/p PCI to LAD in 2015 and 2016, s/p CABG 2017, systolic HF EF 20-25%, microcytic/iron deficiency anemia followed by hematology and GI, LBBB, and tobacco use. Illiterate and cannot read.   In 2/20 he presented to [facility] ED with SOB, cough, and orthopnea in setting of med non-compliance. Was unable to afford copays with Medicaid. He was tachycardic and tachypneic on arrival, and was transferred to [facility]2/2/20for further evaluation. Echo performed which showed newly decreased EF of 25-30% from 50-55% in 12/2017. Taken for [facility] which showed severe 1 v disease with occluded LAD within previous stent. RCA and LCX normal. LIMA to LAD and SVG to D2 patent. Cath also showed low filling pressures with moderately low cardiac output. Meds adjusted as tolerated.  OV 04/05/20 returns for HF follow up after not being seen in > 1 year. Lives with his girlfriend. Followed by HF Paramedicine. Lasix was stopped & [name] was started.  OV 07/15/20 Echo was completed LVEF 20-25%, RV ok. He was instructed to increase Entresto to 49-51. Unfortunately, refill was sent to wrong pharmacy. We contacted pharmacy today that dispenses his pill packs and he has currently been taking 24-26 mg dose.    3/22 he was discharged from community paramedicine program and bubble pill packs arranged.   Today he returns for HF follow up. Here by himself. Denies resting dyspnea, orthopnea/PND. Has exertional dyspnea w/ moderate level activity. No CP. BP elevated at 152/86.  He reports he took his AM meds today but forgot to take his meds last PM. ReDs clip 33%.  He smells of smoke but denies use. He reports his roommate smokes. Denies ETOH use.     Echo 06/2920: EF 20-25%, RV ok  Echo 01/2019: EF 20-25%  Echo 06/2018: EF 20-25%  cMRI 06/27/18: EF 24%. Normal RV  RHC/LHC  06/24/2018 RA = 1 RV = 19/3 PA = 24/5 (14) PCW = 7 Fick cardiac output/index = 4.0/2.5 PVR = 1.9 WU Ao sat = 95% PA sat = 68%, 71%  Assessment: 1. Severe 1-vessel CAD with occluded LAD within previous stent 2. Normal RCA and LCX 3. LIMA to LAD widely patent 4. SVG - D2 widely patent 5. SVG - D1 likely occluded (grafts not marked)   ROS: All systems negative except as listed in HPI, PMH and Problem List.  SH:  Social History   Socioeconomic History  . Marital status: Single    Spouse name: Not on file  . Number of children: Not on file  . Years of education: Not on file  . Highest education level: Not on file  Occupational History  . Not on file  Tobacco Use  . Smoking status: Former Smoker    Packs/day: 0.50    Types: Cigarettes  . Smokeless tobacco: Never Used  Vaping Use  . Vaping Use: Never used  Substance and Sexual Activity  . Alcohol use: Yes    Comment: daily  . Drug use: Yes    Types: Marijuana  . Sexual activity: Not on file  Other Topics Concern  . Not on file  Social History Narrative  . Not on file   Social Determinants of Health   Financial Resource Strain: Not on file  Food Insecurity: Not on file  Transportation Needs: Not on file  Physical Activity: Not on file  Stress: Not on  file  Social Connections: Not on file  Intimate Partner Violence: Not on file    FH: No family history on file.  Past Medical History:  Diagnosis Date  . CHF (congestive heart failure) (HCC)   . Cirrhosis (HCC)   . Coronary artery disease   . Hypertension   . Pancreatitis     Current Outpatient Medications  Medication Sig Dispense Refill  . aspirin EC 81 MG tablet Take 81 mg by mouth daily.    Marland Kitchen atorvastatin (LIPITOR) 40 MG tablet TAKE 1 TABLET BY MOUTH DAILY AT 6PM 30 tablet 5  . carvedilol (COREG) 3.125 MG tablet Take 1 tablet (3.125 mg total) by mouth 2 (two) times daily with a meal. 180 tablet 3  . ENTRESTO 24-26 MG TAKE ONE TABLET BY MOUTH TWO  TIMES A DAY 60 tablet 0  . FARXIGA 10 MG TABS tablet TAKE 1 TABLET BY MOUTH EVERY MORNING BEFORE BREAKFAST 30 tablet 3  . FEROSUL 325 (65 Fe) MG tablet TAKE 1 TABLET BY MOUTH TWICE DAILY WITH A MEAL 180 tablet 3  . potassium chloride SA (KLOR-CON) 20 MEQ tablet TAKE TWO TABLETS BY MOUTH TWO TIMES A DAY 120 tablet 0  . sacubitril-valsartan (ENTRESTO) 49-51 MG Take 1 tablet by mouth 2 (two) times daily. 60 tablet 5  . sertraline (ZOLOFT) 25 MG tablet Take 1 tablet (25 mg total) by mouth daily. 30 tablet 5  . spironolactone (ALDACTONE) 25 MG tablet TAKE ONE TABLET BY MOUTH AT BEDTIME 30 tablet 10   No current facility-administered medications for this encounter.    Vitals:   10/12/20 1114  BP: (!) 152/86  Pulse: 91  SpO2: 95%  Weight: 58.9 kg (129 lb 12.8 oz)    Wt Readings from Last 3 Encounters:  10/12/20 58.9 kg (129 lb 12.8 oz)  08/04/20 56.7 kg (125 lb)  07/27/20 54 kg (119 lb)   ECG: Not performed   PHYSICAL EXAM: ReDs clip 33%  General:  disheveled appearing, looks older than actual age, smells of smoke. No respiratory difficulty HEENT: normal Neck: supple. JVD 7 cm. Carotids 2+ bilat; no bruits. No lymphadenopathy or thyromegaly appreciated. Cor: PMI nondisplaced. Regular rate & rhythm. No rubs, gallops or murmurs. Lungs: clear Abdomen: soft, nontender, nondistended. No hepatosplenomegaly. No bruits or masses. Good bowel sounds. Extremities: no cyanosis, clubbing, rash, edema Neuro: alert & oriented x 3, cranial nerves grossly intact. moves all 4 extremities w/o difficulty. Affect pleasant.   ASSESSMENT & PLAN: 1.Chronic Systolic Heart Failure, NICM - Echo 06/2018 EF 25-30%,  cMRI 06/27/2018 with EF 24%, Echo 01/2019 EF 20-25% - Echo 07/15/20 EF 20-25% RV ok  - NYHA Class II-early III. Volume status ok, ReDs Clip 33%  - Continue Farxiga 10 - Continue carvedilol 6.25 mg twice a day.  - Increase Entresto 49-51 mg bid (confirmed w/ pharmacy, he has currently been taking  24-26 dose) - Continue spironolactone 25 mg daily at bedtime.  - Refer for CRT-D with wide LBBB - Refer back to paramedicine (see below) - BMP today and again in 7 days   2. CAD - S/P CABG 2017, LHC 06/2018  with occluded LAD normal LCX and RCA. LIMA to LAD and SVG to D2 patent (SVG to D1 occluded) - stable w/o CP  - Continue aspirin 81 mg daily + atorvastatin.     3. LBBB - EKG QRS 170 ms.  - He was previously referred to EP for CRT-D with wide LBBB. No appt made yet. Will check on status  of referral    4.  Tobacco Abuse - No longer smoking. Lives with people who do smoke. Denies any recent use   5. HTN - Slightly elevated today  - Increase Entresto to 49-51 bid  6. Illiterate - Cannot read. Able to write numbers. Needs assistance with medications. Has pill packs but still struggling w/ meds. I'm still concerned about his compliance and poor health literacy. Will ask if paramedicine can pick back up   F/u w/ APP in 4 weeks   Robbie Lis, PA-C 10/12/2020

## 2020-10-12 NOTE — Patient Instructions (Addendum)
INCREASE Entresto to 49/51mg  (1 tab) twice a day  Labs today We will only contact you if something comes back abnormal or we need to make some changes. Otherwise no news is good news!  Your physician recommends that you schedule a follow-up appointment in: 4-6 weeks with the Nurse Practitioner/Physician Assistant  Please call office at [phone] option 2 if you have any questions or concerns.   At the [facility], you and your health needs are our priority. As part of our continuing mission to provide you with exceptional heart care, we have created designated Provider Care Teams. These Care Teams include your primary Cardiologist (physician) and Advanced Practice Providers (APPs- Physician Assistants and Nurse Practitioners) who all work together to provide you with the care you need, when you need it.   You may see any of the following providers on your designated Care Team at your next follow up: Marland Kitchen Dr Arvilla Meres . Dr Marca Ancona . Dr Thornell Mule . Tonye Becket, NP . Robbie Lis, PA . Shanda Bumps [place],NP . Karle Plumber, PharmD   Please be sure to bring in all your medications bottles to every appointment.

## 2020-10-13 ENCOUNTER — Telehealth (HOSPITAL_COMMUNITY): Payer: Self-pay | Admitting: Licensed Clinical Social Worker

## 2020-10-13 ENCOUNTER — Encounter (HOSPITAL_COMMUNITY): Payer: Medicare Other

## 2020-10-13 NOTE — Telephone Encounter (Signed)
Pt referred back to paramedicine for home management- referral completed and sent out to paramedics.  Will continue to follow and assist as needed  Burna Sis, LCSW Clinical Social Worker [facility] Desk#: [phone] Cell#: [phone]

## 2020-10-14 ENCOUNTER — Telehealth (HOSPITAL_COMMUNITY): Payer: Self-pay

## 2020-10-14 NOTE — Telephone Encounter (Signed)
Spoke to a friend of Jeremy Powers who reports he had just went to run an errand. I made her aware of his upcoming appointment for labs on 6/1 at 3:15 and she already had planned to bring him to same. I asked her to notify him I would be coming back out to see him and  I would meet him at his lab appointment Wednesday to confirm medications if he could bring them all with him to appointment. She agreed and said she will make sure he does. Call complete. I will call and remind them of this again next week.

## 2020-10-19 ENCOUNTER — Ambulatory Visit (HOSPITAL_COMMUNITY)
Admission: RE | Admit: 2020-10-19 | Discharge: 2020-10-19 | Disposition: A | Discharge status: Home / Self Care | Payer: Medicare Other | Source: Ambulatory Visit | Attending: Cardiology | Admitting: Cardiology

## 2020-10-19 ENCOUNTER — Telehealth (HOSPITAL_COMMUNITY): Payer: Self-pay

## 2020-10-19 ENCOUNTER — Other Ambulatory Visit: Payer: Self-pay

## 2020-10-19 ENCOUNTER — Other Ambulatory Visit (HOSPITAL_COMMUNITY): Payer: Self-pay

## 2020-10-19 DIAGNOSIS — I5022 Chronic systolic (congestive) heart failure: Secondary | ICD-10-CM | POA: Diagnosis present

## 2020-10-19 LAB — BASIC METABOLIC PANEL
Anion gap: 9 (ref 5–15)
BUN: <5 mg/dL — ABNORMAL LOW (ref 8–23)
CO2: 24 mmol/L (ref 22–32)
Calcium: 8 mg/dL — ABNORMAL LOW (ref 8.9–10.3)
Chloride: 107 mmol/L (ref 98–111)
Creatinine, Ser: 0.66 mg/dL (ref 0.61–1.24)
GFR, Estimated: >60 mL/min (ref >60)
Glucose, Bld: 71 mg/dL (ref 70–99)
Potassium: 3 mmol/L — ABNORMAL LOW (ref 3.5–5.1)
Sodium: 140 mmol/L (ref 135–145)

## 2020-10-19 NOTE — Telephone Encounter (Signed)
Labs noting potassium is low, per [place] increase K+ to BID. (3 tablets) I sent message to [name] and her daughter Rodney Booze who assists with [name] care about increase in Potassium. They are aware and will ensure he gets the extra potassium once they receive it from the pharmacy. I will check in on Monday to ensure he received it.

## 2020-10-19 NOTE — Progress Notes (Signed)
Paramedicine Encounter    Patient ID: Jeremy Powers, male    DOB: 07/02/1955, 64 y.o.   MRN: [id number]  Met Jeremy Powers in the clinic today where he was getting his labs taken. He brought his bubble packs with him and I confirmed medications. He currently has everything however the Entresto is the old dose of 24-26mg and his dose was increased by Brittany Simmons PA last week to 49-51mg. I spoke to pharmacy who report they will be filling the next months supply on the 16th including the new dose of Entresto. Jeremy Powers reports he is taking his medicines morning and bedtime. I talked to Tasha his girlfriends daughter who lives with him who is a CNA and assists with his medicines and appointments. I scheduled a PCP appointment for Monday June 6th 0820. I will have Tasha help set up his transportation. I will go out next week for home visit.      ACTION: Next visit planned for one week      

## 2020-10-19 NOTE — Telephone Encounter (Signed)
Spoke to [place] and his care taker to remind him of his appointment this afternoon and to bring his medications. [facility] has his transportation set up. Call complete.

## 2020-10-24 ENCOUNTER — Emergency Department (HOSPITAL_COMMUNITY)
Admission: EM | Admit: 2020-10-24 | Discharge: 2020-10-25 | Disposition: A | Discharge status: Home / Self Care | Payer: Medicare Other | Attending: Emergency Medicine | Admitting: Emergency Medicine

## 2020-10-24 ENCOUNTER — Encounter (HOSPITAL_COMMUNITY): Payer: Self-pay | Admitting: *Deleted

## 2020-10-24 ENCOUNTER — Emergency Department (HOSPITAL_COMMUNITY): Payer: Medicare Other

## 2020-10-24 ENCOUNTER — Other Ambulatory Visit: Payer: Self-pay

## 2020-10-24 ENCOUNTER — Other Ambulatory Visit (HOSPITAL_COMMUNITY): Payer: Self-pay

## 2020-10-24 ENCOUNTER — Telehealth (HOSPITAL_COMMUNITY): Payer: Self-pay

## 2020-10-24 ENCOUNTER — Ambulatory Visit: Payer: Medicare Other | Admitting: Medical

## 2020-10-24 DIAGNOSIS — Z7982 Long term (current) use of aspirin: Secondary | ICD-10-CM | POA: Diagnosis not present

## 2020-10-24 DIAGNOSIS — I509 Heart failure, unspecified: Secondary | ICD-10-CM

## 2020-10-24 DIAGNOSIS — R0602 Shortness of breath: Secondary | ICD-10-CM | POA: Diagnosis not present

## 2020-10-24 DIAGNOSIS — Z951 Presence of aortocoronary bypass graft: Secondary | ICD-10-CM | POA: Insufficient documentation

## 2020-10-24 DIAGNOSIS — R059 Cough, unspecified: Secondary | ICD-10-CM | POA: Insufficient documentation

## 2020-10-24 DIAGNOSIS — I5021 Acute systolic (congestive) heart failure: Secondary | ICD-10-CM | POA: Diagnosis not present

## 2020-10-24 DIAGNOSIS — Z79899 Other long term (current) drug therapy: Secondary | ICD-10-CM | POA: Insufficient documentation

## 2020-10-24 DIAGNOSIS — Z87891 Personal history of nicotine dependence: Secondary | ICD-10-CM | POA: Insufficient documentation

## 2020-10-24 DIAGNOSIS — R0902 Hypoxemia: Secondary | ICD-10-CM | POA: Diagnosis present

## 2020-10-24 DIAGNOSIS — I2510 Atherosclerotic heart disease of native coronary artery without angina pectoris: Secondary | ICD-10-CM | POA: Diagnosis not present

## 2020-10-24 DIAGNOSIS — R0781 Pleurodynia: Secondary | ICD-10-CM | POA: Diagnosis not present

## 2020-10-24 DIAGNOSIS — I110 Hypertensive heart disease with heart failure: Secondary | ICD-10-CM | POA: Insufficient documentation

## 2020-10-24 DIAGNOSIS — Z20822 Contact with and (suspected) exposure to covid-19: Secondary | ICD-10-CM | POA: Insufficient documentation

## 2020-10-24 LAB — CBC
HCT: 36.9 % — ABNORMAL LOW (ref 39.0–52.0)
Hemoglobin: 11.8 g/dL — ABNORMAL LOW (ref 13.0–17.0)
MCH: 30.9 pg (ref 26.0–34.0)
MCHC: 32 g/dL (ref 30.0–36.0)
MCV: 96.6 fL (ref 80.0–100.0)
Platelets: 238 10*3/uL (ref 150–400)
RBC: 3.82 MIL/uL — ABNORMAL LOW (ref 4.22–5.81)
RDW: 16 % — ABNORMAL HIGH (ref 11.5–15.5)
WBC: 3 10*3/uL — ABNORMAL LOW (ref 4.0–10.5)
nRBC: 0 % (ref 0.0–0.2)

## 2020-10-24 LAB — BRAIN NATRIURETIC PEPTIDE: B Natriuretic Peptide: 2439 pg/mL — ABNORMAL HIGH (ref 0.0–100.0)

## 2020-10-24 LAB — COMPREHENSIVE METABOLIC PANEL
ALT: 11 U/L (ref 0–44)
AST: 42 U/L — ABNORMAL HIGH (ref 15–41)
Albumin: 3.1 g/dL — ABNORMAL LOW (ref 3.5–5.0)
Alkaline Phosphatase: 106 U/L (ref 38–126)
Anion gap: 9 (ref 5–15)
BUN: 6 mg/dL — ABNORMAL LOW (ref 8–23)
CO2: 25 mmol/L (ref 22–32)
Calcium: 7.9 mg/dL — ABNORMAL LOW (ref 8.9–10.3)
Chloride: 106 mmol/L (ref 98–111)
Creatinine, Ser: 0.65 mg/dL (ref 0.61–1.24)
GFR, Estimated: >60 mL/min (ref >60)
Glucose, Bld: 81 mg/dL (ref 70–99)
Potassium: 3.1 mmol/L — ABNORMAL LOW (ref 3.5–5.1)
Sodium: 140 mmol/L (ref 135–145)
Total Bilirubin: 0.9 mg/dL (ref 0.3–1.2)
Total Protein: 7 g/dL (ref 6.5–8.1)

## 2020-10-24 LAB — TROPONIN I (HIGH SENSITIVITY)
Troponin I (High Sensitivity): 32 ng/L — ABNORMAL HIGH (ref <18)
Troponin I (High Sensitivity): 34 ng/L — ABNORMAL HIGH (ref <18)

## 2020-10-24 MED ORDER — POTASSIUM CHLORIDE CRYS ER 20 MEQ PO TBCR: 60.0000 meq | EXTENDED_RELEASE_TABLET | Freq: Two times a day (BID) | ORAL | 11 refills | Status: DC

## 2020-10-24 MED ORDER — FUROSEMIDE 10 MG/ML IJ SOLN
60.0000 mg | Freq: Once | INTRAMUSCULAR | Status: AC
  Administered 2020-10-24: 60 mg via INTRAVENOUS
  Filled 2020-10-24: qty 6

## 2020-10-24 NOTE — ED Provider Notes (Signed)
Emergency Medicine Provider Triage Evaluation Note  Jeremy Powers , a 65 y.o. male  was evaluated in triage.  Pt complains of shortness of breath, orthopnea, PND, leg swelling.  Denies chest pain, history of MI, history of CHF, poor historian, states that he needs a pacemaker but has not been able to get back here to get it yet..  Review of Systems  Positive: Peripheral edema orthopnea Negative: Cough  Physical Exam  BP 133/79 (BP Location: Left Arm)   Pulse 83   Temp (!) 97.5 F (36.4 C) (Oral)   Resp 18   SpO2 98%  Gen:   Awake, no distress   Resp:  Normal effort  MSK:   Moves extremities without difficulty  Other:  No respiratory distress  Medical Decision Making  Medically screening exam initiated at 6:40 PM.  Appropriate orders placed.  [name] was informed that the remainder of the evaluation will be completed by another provider, this initial triage assessment does not replace that evaluation, and the importance of remaining in the ED until their evaluation is complete.  Work-up initiated for shortness of breath   Arthor Captain, PA-C 10/24/20 1841    Maia Plan, MD 10/24/20 2015

## 2020-10-24 NOTE — Telephone Encounter (Signed)
Received call from [facility] girlfriend) reporting he is coughing up his spit, emotionally upset and having trouble breathing with near syncopal episodes. [name] son called for an ambulance and he reports they are going to take him to [facility]. I will follow up upon discharge.

## 2020-10-24 NOTE — ED Triage Notes (Signed)
Pt arrived by ems from home. Pt having fatigue, weakness and leg swelling. Sob occurs when lying down. Pt was scheduled for pacemaker placement last week but left ama.

## 2020-10-24 NOTE — Telephone Encounter (Signed)
Got a call from Karol's girl friends daughter who reports the ambulance was called on him this morning. Jona had an episode of coughing and tremors this morning. I spoke to the paramedic on scene and they advised Fredrick was alert and oriented and reported he had not been taking his medications over the weekend. I expressed the importance to them to advise Dorwin the importance to take his medications. They reported they would talk with him about this also. His vitals are as follows:    BP- 118/60 O2- 98% HR- 88   Anush is electing not to go to the ER. I advised him to ensure he is taking his medicines and to call me if needed but I would be out later this week to see him.  Call complete.

## 2020-10-24 NOTE — ED Provider Notes (Signed)
[facility] EMERGENCY DEPARTMENT Provider Note   CSN: [id number] Arrival date & time: 10/24/20  1700     History Chief Complaint  Patient presents with  . Weakness  . Leg Swelling    Jeremy Powers is a 65 y.o. male with history of CAD s/p PCI, CABG, CHF EF 20-25%, anemia, LBBB, tobacco use presents to ED for evaluation of shortness of breath. States he can't even walk a block before he gets very winded, has to sit down. Has associated pain in his chest in right and left ribs, sharp. Also light headed and feels faint.  Has a cough with intermittent bloody sputum, ankle swelling.  States he can't lay on his back for long because he gets uncomfortable.  Long history of tobacco use but states he quit 2 years ago.  No fever. No vomiting. Says he takes all of his medicines but doesn't know the names or what they are for.  Thinks he takes something for fluid but doesn't know the dose. Was told he needed a pacemaker but hasn't had an appointment for that yet.  Patient can read or write. He had sausage and cheese and [employer] hot dogs for meals last night.   HPI     Past Medical History:  Diagnosis Date  . CHF (congestive heart failure) (HCC)   . Cirrhosis (HCC)   . Coronary artery disease   . Hypertension   . Pancreatitis     Patient Active Problem List   Diagnosis Date Noted  . Acute systolic (congestive) heart failure (HCC) 06/24/2018  . Aortic insufficiency 06/22/2018  . Acute on chronic congestive heart failure (HCC) 06/22/2018  . Elevated troponin 06/22/2018  . CAD (coronary artery disease) 06/22/2018  . Left bundle branch block 06/22/2018  . History of iron deficiency anemia 06/22/2018  . Acute on chronic heart failure (HCC) 06/22/2018    Past Surgical History:  Procedure Laterality Date  . CARDIAC SURGERY    . RIGHT/LEFT HEART CATH AND CORONARY/GRAFT ANGIOGRAPHY N/A 06/24/2018   Procedure: RIGHT/LEFT HEART CATH AND CORONARY/GRAFT ANGIOGRAPHY;  Surgeon:  Dolores Patty, MD;  Location: [facility] INVASIVE CV LAB;  Service: Cardiovascular;  Laterality: N/A;       History reviewed. No pertinent family history.  Social History   Tobacco Use  . Smoking status: Former Smoker    Packs/day: 0.50    Types: Cigarettes  . Smokeless tobacco: Never Used  Vaping Use  . Vaping Use: Never used  Substance Use Topics  . Alcohol use: Yes    Comment: daily  . Drug use: Yes    Types: Marijuana    Home Medications Prior to Admission medications   Medication Sig Start Date End Date Taking? Authorizing Provider  aspirin EC 81 MG tablet Take 81 mg by mouth daily.    [provider]  atorvastatin (LIPITOR) 40 MG tablet TAKE 1 TABLET BY MOUTH DAILY AT 6PM 05/25/20   Bensimhon, Bevelyn Buckles, MD  carvedilol (COREG) 3.125 MG tablet Take 1 tablet (3.125 mg total) by mouth 2 (two) times daily with a meal. 06/27/20   Simmons, Brittainy M, PA-C  FARXIGA 10 MG TABS tablet TAKE 1 TABLET BY MOUTH EVERY MORNING BEFORE BREAKFAST 07/22/20   Bensimhon, Bevelyn Buckles, MD  FEROSUL 325 (65 Fe) MG tablet TAKE 1 TABLET BY MOUTH TWICE DAILY WITH A MEAL 05/18/20   Laurey Morale, MD  potassium chloride SA (KLOR-CON) 20 MEQ tablet Take 3 tablets (60 mEq total) by mouth 2 (two)  times daily. 10/24/20   Robbie Lis M, PA-C  sacubitril-valsartan (ENTRESTO) 49-51 MG Take 1 tablet by mouth 2 (two) times daily. 10/12/20   Robbie Lis M, PA-C  sertraline (ZOLOFT) 25 MG tablet Take 1 tablet (25 mg total) by mouth daily. 06/07/20   Saguier, Ramon Dredge, PA-C  spironolactone (ALDACTONE) 25 MG tablet TAKE ONE TABLET BY MOUTH AT BEDTIME 10/03/20   Bensimhon, Bevelyn Buckles, MD    Allergies    Patient has no known allergies.  Review of Systems   Review of Systems  Respiratory: Positive for cough and shortness of breath.   Cardiovascular: Positive for chest pain and leg swelling.  Neurological: Positive for light-headedness.  All other systems reviewed and are negative.   Physical  Exam Updated Vital Signs BP 131/76   Pulse 86   Temp (!) 97.5 F (36.4 C) (Oral)   Resp 14   SpO2 99%   Physical Exam Constitutional:      Appearance: He is well-developed.     Comments: NAD. Non toxic.   HENT:     Head: Normocephalic and atraumatic.     Nose: Nose normal.  Eyes:     General: Lids are normal.     Conjunctiva/sclera: Conjunctivae normal.  Neck:     Trachea: Trachea normal.     Comments: Trachea midline.  Cardiovascular:     Rate and Rhythm: Normal rate and regular rhythm.     Pulses:          Radial pulses are 1+ on the right side and 1+ on the left side.       Dorsalis pedis pulses are 1+ on the right side and 1+ on the left side.     Heart sounds: Normal heart sounds, S1 normal and S2 normal.     Comments: 1+ ankle edema, symmetric. No murmurs. No calf tenderness.  Pulmonary:     Effort: Pulmonary effort is normal.     Breath sounds: Rhonchi and rales present.  Abdominal:     General: Bowel sounds are normal.     Palpations: Abdomen is soft.     Tenderness: There is no abdominal tenderness.     Comments: No epigastric or upper abdominal tenderness.  Musculoskeletal:     Cervical back: Normal range of motion.  Skin:    General: Skin is warm and dry.     Capillary Refill: Capillary refill takes less than 2 seconds.     Comments: No rash to chest wall  Neurological:     Mental Status: He is alert.     GCS: GCS eye subscore is 4. GCS verbal subscore is 5. GCS motor subscore is 6.  Psychiatric:        Speech: Speech normal.        Behavior: Behavior normal.        Thought Content: Thought content normal.     ED Results / Procedures / Treatments   Labs (all labs ordered are listed, but only abnormal results are displayed) Labs Reviewed  CBC - Abnormal; Notable for the following components:      Result Value   WBC 3.0 (*)    RBC 3.82 (*)    Hemoglobin 11.8 (*)    HCT 36.9 (*)    RDW 16.0 (*)    All other components within normal limits   COMPREHENSIVE METABOLIC PANEL - Abnormal; Notable for the following components:   Potassium 3.1 (*)    BUN 6 (*)    Calcium 7.9 (*)  Albumin 3.1 (*)    AST 42 (*)    All other components within normal limits  BRAIN NATRIURETIC PEPTIDE - Abnormal; Notable for the following components:   B Natriuretic Peptide 2,439.0 (*)    All other components within normal limits  TROPONIN I (HIGH SENSITIVITY) - Abnormal; Notable for the following components:   Troponin I (High Sensitivity) 32 (*)    All other components within normal limits  TROPONIN I (HIGH SENSITIVITY) - Abnormal; Notable for the following components:   Troponin I (High Sensitivity) 34 (*)    All other components within normal limits  RESP PANEL BY RT-PCR (FLU A&B, COVID) [id number]    EKG EKG Interpretation  Date/Time:  Monday October 24 2020 18:39:57 EDT Ventricular Rate:  83 PR Interval:  184 QRS Duration: 180 QT Interval:  494 QTC Calculation: 580 R Axis:   99 Text Interpretation: Normal sinus rhythm Possible Left atrial enlargement Rightward axis Left bundle branch block Abnormal ECG Confirmed by Kristine Royal [phone]) on 10/24/2020 7:35:08 PM   Radiology DG Chest 2 View  Result Date: 10/24/2020 CLINICAL DATA:  Shortness of breath.  Weakness and leg swelling. EXAM: CHEST - 2 VIEW COMPARISON:  Radiograph 06/24/2018 FINDINGS: Post median sternotomy. Cardiomegaly is new from prior exam. Coronary stent is visualized. Aortic atherosclerosis. Small right and trace left pleural effusion with mild fluid in the fissures. No convincing pulmonary edema. No pneumothorax. No acute osseous abnormalities are seen. IMPRESSION: Cardiomegaly with small right and trace left pleural effusion. No convincing pulmonary edema. Electronically Signed   By: Narda Rutherford M.D.   On: 10/24/2020 20:02    Procedures .Critical Care Performed by: Liberty Handy, PA-C Authorized by: Liberty Handy, PA-C   Critical care provider statement:     Critical care time (minutes):  45   Critical care was necessary to treat or prevent imminent or life-threatening deterioration of the following conditions:  Respiratory failure and cardiac failure   Critical care was time spent personally by me on the following activities:  Discussions with consultants, evaluation of patient's response to treatment, examination of patient, ordering and performing treatments and interventions, ordering and review of laboratory studies, ordering and review of radiographic studies, pulse oximetry, re-evaluation of patient's condition, obtaining history from patient or surrogate, review of old charts and development of treatment plan with patient or surrogate   I assumed direction of critical care for this patient from another provider in my specialty: no       Medications Ordered in ED Medications  furosemide (LASIX) injection 60 mg (60 mg Intravenous Given 10/24/20 2328)    ED Course  I have reviewed the triage vital signs and the nursing notes.  Pertinent labs & imaging results that were available during my care of the patient were reviewed by me and considered in my medical decision making (see chart for details).  Clinical Course as of 10/24/20 2357  Mon Oct 24, 2020  2023 DG Chest 2 View IMPRESSION: Cardiomegaly with small right and trace left pleural effusion. No convincing pulmonary edema. [CG]    Clinical Course User Index [CG] Jerrell Mylar   MDM Rules/Calculators/A&P                           65 y.o. yo male presents to the ED for exertional shortness of breath, chest pain, ankle swelling, orthopnea, cough.  Additional information obtained from chart, nursing and triage notes review  Chart review  reveals -patient has extensive cardiac history.  Seen recently by cardiology.  Chronic systolic heart failure with EF of 20%.  Has been referred to EP for a defibrillator.  History of CAD/CABG.  Ordered lab, imaging were personally  reviewed and interpreted  Labs reveal -BNP 2439.  Troponin was 32.  Stable hemoglobin.  K3.1.  Albumin 3.1.  Creatinine is normal.  Imaging reveals -chest x-ray shows cardiomegaly with small right and trace left pleural effusions.  No overt pulmonary edema.  EKG shows LBBB otherwise unchanged from previous.  Medicines ordered -Lasix  ED course & MDM  2215: Clinical presentation is most consistent with acute CHF exacerbation.  Suspect due to medical noncompliance and diet indiscretion.  I reevaluated patient and SPO2 is 80 to 92% on room air at rest.  He was placed on 2 L [place].  Vital signs otherwise normal.  Pending cardiology consult for admission.  2355: Spoke with cardiology, will see patient.   Portions of this note were generated with Scientist, clinical (histocompatibility and immunogenetics). Dictation errors may occur despite best attempts at proofreading   Final Clinical Impression(s) / ED Diagnoses Final diagnoses:  Hypoxia    Rx / DC Orders ED Discharge Orders    None       Liberty Handy, PA-C 10/24/20 2357    Wynetta Fines, MD 10/27/20 [phone]

## 2020-10-25 DIAGNOSIS — I509 Heart failure, unspecified: Secondary | ICD-10-CM | POA: Diagnosis not present

## 2020-10-25 LAB — RESP PANEL BY RT-PCR (FLU A&B, COVID) ARPGX2
Influenza A by PCR: NEGATIVE
Influenza B by PCR: NEGATIVE
SARS Coronavirus 2 by RT PCR: NEGATIVE

## 2020-10-25 MED ORDER — FUROSEMIDE 40 MG PO TABS: 40.0000 mg | ORAL_TABLET | Freq: Every day | ORAL | 0 refills | Status: DC

## 2020-10-25 NOTE — ED Notes (Signed)
Patient verbalizes understanding of discharge instructions. Opportunity for questioning and answers were provided. Armband removed by staff, pt discharged from ED via wheelchair.  

## 2020-10-25 NOTE — ED Notes (Addendum)
Pt ambulated to restroom with this RN assistance. Pt O2 stayed between 94-100% pulse 110-114. Pt stated that he felt a little SOB while walking but no pain

## 2020-10-25 NOTE — Discharge Instructions (Addendum)
You were seen today for CHF exacerbation.  It is important you take your medications as directed.  We are going to add Lasix 40 mg every day.  This will make you urinate.  Please return to the emergency department for worsening shortness of breath, lightheadedness, syncope or other concerns.

## 2020-10-25 NOTE — ED Notes (Signed)
With pt permission this RN called pt sister, [name] - gave her an update on pt and she is coming to pick pt up

## 2020-10-25 NOTE — ED Provider Notes (Signed)
Care assumed from [place], [place].  Please see her full H&P.  In short,  Jeremy Powers is a 65 y.o. male presents for worsening shortness of breath on exertion with and right-sided chest pain.  Initial work-up consistent with acute CHF exacerbation.  Patient has a history of same and a history of medication noncompliance.  Physical Exam  BP 131/67   Pulse 84   Temp (!) 97.5 F (36.4 C) (Oral)   Resp 14   SpO2 93%   Physical Exam Vitals and nursing note reviewed.  Constitutional:      General: He is not in acute distress.    Appearance: He is well-developed.  HENT:     Head: Normocephalic.  Eyes:     General: No scleral icterus.    Conjunctiva/sclera: Conjunctivae normal.  Cardiovascular:     Rate and Rhythm: Normal rate.  Pulmonary:     Effort: Pulmonary effort is normal.     Comments: Speaks in full sentences, no accessory muscle usage Musculoskeletal:        General: Normal range of motion.     Cervical back: Normal range of motion.     Right lower leg: Edema present.     Left lower leg: Edema present.  Skin:    General: Skin is warm and dry.  Neurological:     Mental Status: He is alert.     ED Course/Procedures   Clinical Course as of 10/25/20 0446  Mon Oct 24, 2020  2023 DG Chest 2 View IMPRESSION: Cardiomegaly with small right and trace left pleural effusion. No convincing pulmonary edema. [CG]    Clinical Course User Index [CG] Liberty Handy, PA-C    Procedures  MDM    4:36 AM Patient has been evaluated by cardiology Dr. Cherly Beach and does not feel patient needs admission at this time.  After IV diuresis with Lasix he is no longer hypoxic.  Ambulates with mild shortness of breath without hypoxia and without need for oxygen.  Cardiology recommends adding Lasix 40 mg daily to his daily medication regimen.  He only intermittently takes his medications.  Patient is followed by community paramedic.  I have prescribed Lasix and messaged her about  the change in medications.  He will need close follow-up with the heart failure clinic.  Reports he feels well and is comfortable with d/c home  Hypoxia  Acute on chronic congestive heart failure, unspecified heart failure type [facility])       Dannelle Rhymes, Boyd Kerbs 10/25/20 0446    Melene Plan, DO 10/25/20 0501

## 2020-10-25 NOTE — Consult Note (Addendum)
Cardiology Consultation:   Patient ID: Jeremy Powers MRN: [id number]; DOB: 1956-04-05  Admit date: 10/24/2020 Date of Consult: 10/25/2020  Primary Care Provider: [name], PA-C [facility] Cardiologist: Dr. Cyn[facility] Electrophysiologist:  None   Patient Profile:   Jeremy Powers is a 65 y.o. male with CAD s/p PCI to LAD in 2015 and 2016, s/p CABG 2017, systolic HF EF 20-25%, microcytic/iron deficiency anemia followed by hematology and GI, LBBB, and tobacco use who presents with SOB c/f HFrEF exacerbation.   History of Present Illness:   Jeremy Powers reports that over the past 3 to 4 weeks he has noticed gradually worsening shortness of breath, mild lower extremity edema, and dyspnea on exertion along with some PND.  He has 2-3 pillow orthopnea and admits that he only takes his medications about 3 days out of the week.  Health literacy and inability to read/write unfortunately serves a significant barrier to his care.  He states that he otherwise was feeling okay but his girlfriend insisted that he go to the emergency department to be evaluated.  His troponins were mildly elevated without significant delta and his ECG was unremarkable for any new signs of ischemia.  He received 1 dose of Lasix 60 mg IV in the ED with significant urine output and symptom relief.  He denied any chest pain, chest pressure, or other anginal equivalents.  He is not sure whether he is gained weight as he does not routinely weigh himself.  Past Medical History:  Diagnosis Date  . CHF (congestive heart failure) (HCC)   . Cirrhosis (HCC)   . Coronary artery disease   . Hypertension   . Pancreatitis    Past Surgical History:  Procedure Laterality Date  . CARDIAC SURGERY    . RIGHT/LEFT HEART CATH AND CORONARY/GRAFT ANGIOGRAPHY N/A 06/24/2018   Procedure: RIGHT/LEFT HEART CATH AND CORONARY/GRAFT ANGIOGRAPHY;  Surgeon: Dolores Patty, MD;  Location: [facility] INVASIVE CV LAB;  Service:  Cardiovascular;  Laterality: N/A;    Home Medications:  Prior to Admission medications   Medication Sig Start Date End Date Taking? Authorizing Provider  furosemide (LASIX) 40 MG tablet Take 1 tablet (40 mg total) by mouth daily. 10/25/20  Yes Muthersbaugh, Boyd Kerbs  aspirin EC 81 MG tablet Take 81 mg by mouth daily.    [provider]  atorvastatin (LIPITOR) 40 MG tablet TAKE 1 TABLET BY MOUTH DAILY AT 6PM 05/25/20   Bensimhon, Bevelyn Buckles, MD  carvedilol (COREG) 3.125 MG tablet Take 1 tablet (3.125 mg total) by mouth 2 (two) times daily with a meal. 06/27/20   Simmons, Brittainy M, PA-C  FARXIGA 10 MG TABS tablet TAKE 1 TABLET BY MOUTH EVERY MORNING BEFORE BREAKFAST 07/22/20   Bensimhon, Bevelyn Buckles, MD  FEROSUL 325 (65 Fe) MG tablet TAKE 1 TABLET BY MOUTH TWICE DAILY WITH A MEAL 05/18/20   Laurey Morale, MD  potassium chloride SA (KLOR-CON) 20 MEQ tablet Take 3 tablets (60 mEq total) by mouth 2 (two) times daily. 10/24/20   Robbie Lis M, PA-C  sacubitril-valsartan (ENTRESTO) 49-51 MG Take 1 tablet by mouth 2 (two) times daily. 10/12/20   Robbie Lis M, PA-C  sertraline (ZOLOFT) 25 MG tablet Take 1 tablet (25 mg total) by mouth daily. 06/07/20   Saguier, Ramon Dredge, PA-C  spironolactone (ALDACTONE) 25 MG tablet TAKE ONE TABLET BY MOUTH AT BEDTIME 10/03/20   Bensimhon, Bevelyn Buckles, MD    Inpatient Medications: Scheduled Meds:  Continuous Infusions:  PRN Meds:  Allergies:   No Known Allergies  Social History:   Social History   Socioeconomic History  . Marital status: Single    Spouse name: Not on file  . Number of children: Not on file  . Years of education: Not on file  . Highest education level: Not on file  Occupational History  . Not on file  Tobacco Use  . Smoking status: Former Smoker    Packs/day: 0.50    Types: Cigarettes  . Smokeless tobacco: Never Used  Vaping Use  . Vaping Use: Never used  Substance and Sexual Activity  . Alcohol use: Yes    Comment:  daily  . Drug use: Yes    Types: Marijuana  . Sexual activity: Not on file  Other Topics Concern  . Not on file  Social History Narrative  . Not on file   Social Determinants of Health   Financial Resource Strain: Not on file  Food Insecurity: Not on file  Transportation Needs: Not on file  Physical Activity: Not on file  Stress: Not on file  Social Connections: Not on file  Intimate Partner Violence: Not on file    Family History:   History reviewed. No pertinent family history.   ROS:  Review of Systems: [y] = yes, [ ]  = no       General: Weight gain [ ] ; Weight loss [ ] ; Anorexia [ ] ; Fatigue [ ] ; Fever [ ] ; Chills [ ] ; Weakness [ ]     Cardiac: Chest pain/pressure [ ] ; Resting SOB [y]; Exertional SOB [y]; Orthopnea [y]; Pedal Edema [y]; Palpitations [ ] ; Syncope [ ] ; Presyncope [ ] ; Paroxysmal nocturnal dyspnea [y]    Pulmonary: Cough [ ] ; Wheezing [ ] ; Hemoptysis [ ] ; Sputum [ ] ; Snoring [ ]     GI: Vomiting [ ] ; Dysphagia [ ] ; Melena [ ] ; Hematochezia [ ] ; Heartburn [ ] ; Abdominal pain [ ] ; Constipation [ ] ; Diarrhea [ ] ; BRBPR [ ]     GU: Hematuria [ ] ; Dysuria [ ] ; Nocturia [ ]   Vascular: Pain in legs with walking [ ] ; Pain in feet with lying flat [ ] ; Non-healing sores [ ] ; Stroke [ ] ; TIA [ ] ; Slurred speech [ ] ;    Neuro: Headaches [ ] ; Vertigo [ ] ; Seizures [ ] ; Paresthesias [ ] ;Blurred vision [ ] ; Diplopia [ ] ; Vision changes [ ]     Ortho/Skin: Arthritis [ ] ; Joint pain [ ] ; Muscle pain [ ] ; Joint swelling [ ] ; Back Pain [ ] ; Rash [ ]     Psych: Depression [ ] ; Anxiety [ ]     Heme: Bleeding problems [ ] ; Clotting disorders [ ] ; Anemia [ ]     Endocrine: Diabetes [ ] ; Thyroid dysfunction [ ]    Physical Exam/Data:   Vitals:   10/25/20 0345 10/25/20 0445 10/25/20 0448 10/25/20 0449  BP: 131/67 133/74    Pulse: 84 88 83 84  Resp: 14 15 12 14   Temp:  98 F (36.7 C)    TempSrc:      SpO2: 93% 95% 96% 95%   No intake or output data in the 24 hours ending  10/25/20 0656 Last 3 Weights 10/12/2020 08/04/2020 07/27/2020  Weight (lbs) 129 lb 12.8 oz 125 lb 119 lb  Weight (kg) 58.877 kg 56.7 kg 53.978 kg     There is no height or weight on file to calculate BMI.  General: No acute distress HEENT: normal Lymph: no adenopathy Neck: JVD at clavicle at 30 degrees Endocrine:  No thryomegaly Vascular: No carotid bruits; FA pulses 2+ bilaterally without bruits  Cardiac:  normal S1, S2; RRR; no murmur Lungs: basilar crackles in b/l lower lung fields Abd: soft, nontender, no hepatomegaly  Ext: mild LE edema Musculoskeletal:  No deformities, BUE and BLE strength normal and equal Skin: warm and dry  Neuro:  CNs 2-12 intact, no focal abnormalities noted Psych:  Normal affect   EKG:  The EKG was personally reviewed and demonstrates: NSR, no ischemic changes Telemetry:  Telemetry was personally reviewed and demonstrates: NSR  Relevant CV Studies:  TTE Result date: 07/15/20 1. Diffuse hypokinesis abnormal septal motion EF similar to echo done  02/05/19 . Left ventricular ejection fraction, by estimation, is 20 to 25%.  The left ventricle has severely decreased function. The left ventricle  demonstrates global hypokinesis. The  left ventricular internal cavity size was severely dilated. Left  ventricular diastolic parameters are consistent with Grade II diastolic  dysfunction (pseudonormalization). Elevated left atrial pressure.  2. Right ventricular systolic function is normal. The right ventricular  size is normal.  3. The mitral valve is normal in structure. Trivial mitral valve  regurgitation. No evidence of mitral stenosis.  4. The aortic valve was not well visualized. Aortic valve regurgitation  is mild. No aortic stenosis is present.  5. The inferior vena cava is normal in size with greater than 50%  respiratory variability, suggesting right atrial pressure of 3 mmHg.   Laboratory Data:  High Sensitivity Troponin:   Recent Labs  Lab  10/24/20 1841 10/24/20 2106  TROPONINIHS 32* 34*     Chemistry Recent Labs  Lab 10/19/20 1504 10/24/20 1841  NA 140 140  K 3.0* 3.1*  CL 107 106  CO2 24 25  GLUCOSE 71 81  BUN <5* 6*  CREATININE 0.66 0.65  CALCIUM 8.0* 7.9*  GFRNONAA >60 >60  ANIONGAP 9 9    Recent Labs  Lab 10/24/20 1841  PROT 7.0  ALBUMIN 3.1*  AST 42*  ALT 11  ALKPHOS 106  BILITOT 0.9   Hematology Recent Labs  Lab 10/24/20 1841  WBC 3.0*  RBC 3.82*  HGB 11.8*  HCT 36.9*  MCV 96.6  MCH 30.9  MCHC 32.0  RDW 16.0*  PLT 238   BNP Recent Labs  Lab 10/24/20 2002  BNP 2,439.0*    DDimer No results for input(s): DDIMER in the last 168 hours.  Radiology/Studies:  DG Chest 2 View  Result Date: 10/24/2020 CLINICAL DATA:  Shortness of breath.  Weakness and leg swelling. EXAM: CHEST - 2 VIEW COMPARISON:  Radiograph 06/24/2018 FINDINGS: Post median sternotomy. Cardiomegaly is new from prior exam. Coronary stent is visualized. Aortic atherosclerosis. Small right and trace left pleural effusion with mild fluid in the fissures. No convincing pulmonary edema. No pneumothorax. No acute osseous abnormalities are seen. IMPRESSION: Cardiomegaly with small right and trace left pleural effusion. No convincing pulmonary edema. Electronically Signed   By: Narda Rutherford M.D.   On: 10/24/2020 20:02   Assessment and Plan:   1. HFrEF exacerbation Jeremy Powers presents with acute on chronic worsening HFrEF complicated by medication noncompliance.  He has intermittent nausea when taking multiple medications and sometimes does not eat prior to taking his meds which makes him feel worse.  He does not know any of his medications as he is not literate so they are all sent to him in blister packs.  Unfortunately around half the time he does not take his medications as prescribed which makes it difficult  to manage his severely reduced EF.  He had good diuresis with Lasix 60 mg IV x1 dose.  He will be prescribed Lasix 40 mg  p.o. daily which I instructed him to take over the next week and then take as needed daily for worsening shortness of breath.  We will have our community care team meet up with home to better assess his weight trends and need for additional diuresis.  His proBNP was elevated however was also elevated at his last clinic appointment to a higher level.  His exam is otherwise reassuring with only mild signs of heart failure.  He is comfortable being discharged home as he is not requiring ongoing oxygen and is otherwise now symptom-free. - start lasix 40 mg PO daily for the next week and prn thereafter  For questions or updates, please contact [facility] Please consult [email or web address] for contact info under   Signed, Linton Rump, MD  10/25/2020 6:56 AM

## 2020-10-28 ENCOUNTER — Telehealth (HOSPITAL_COMMUNITY): Payer: Self-pay

## 2020-10-28 NOTE — Telephone Encounter (Signed)
Completed phone call with [facility] to verify bubble packs with medications and dosing. All correct and will be delivered to patient today. Family and patient made aware of same. Call complete.

## 2020-11-01 ENCOUNTER — Other Ambulatory Visit (HOSPITAL_COMMUNITY): Payer: Self-pay

## 2020-11-01 NOTE — Progress Notes (Signed)
Paramedicine Encounter    Patient ID: Jeremy Powers, male    DOB: 02/27/1956, 65 y.o.   MRN: [id number]   Patient Care Team: [name], Cordelia Poche as PCP - General (Internal Medicine)  Patient Active Problem List   Diagnosis Date Noted   Acute systolic (congestive) heart failure (HCC) 06/24/2018   Aortic insufficiency 06/22/2018   Acute on chronic congestive heart failure (HCC) 06/22/2018   Elevated troponin 06/22/2018   CAD (coronary artery disease) 06/22/2018   Left bundle branch block 06/22/2018   History of iron deficiency anemia 06/22/2018   Acute on chronic heart failure (HCC) 06/22/2018    Current Outpatient Medications:    aspirin EC 81 MG tablet, Take 81 mg by mouth daily., Disp: , Rfl:    atorvastatin (LIPITOR) 40 MG tablet, TAKE 1 TABLET BY MOUTH DAILY AT 6PM, Disp: 30 tablet, Rfl: 5   carvedilol (COREG) 3.125 MG tablet, Take 1 tablet (3.125 mg total) by mouth 2 (two) times daily with a meal., Disp: 180 tablet, Rfl: 3   FARXIGA 10 MG TABS tablet, TAKE 1 TABLET BY MOUTH EVERY MORNING BEFORE BREAKFAST, Disp: 30 tablet, Rfl: 3   FEROSUL 325 (65 Fe) MG tablet, TAKE 1 TABLET BY MOUTH TWICE DAILY WITH A MEAL, Disp: 180 tablet, Rfl: 3   furosemide (LASIX) 40 MG tablet, Take 1 tablet (40 mg total) by mouth daily., Disp: 30 tablet, Rfl: 0   potassium chloride SA (KLOR-CON) 20 MEQ tablet, Take 3 tablets (60 mEq total) by mouth 2 (two) times daily., Disp: 180 tablet, Rfl: 11   sacubitril-valsartan (ENTRESTO) 49-51 MG, Take 1 tablet by mouth 2 (two) times daily., Disp: 60 tablet, Rfl: 5   sertraline (ZOLOFT) 25 MG tablet, Take 1 tablet (25 mg total) by mouth daily., Disp: 30 tablet, Rfl: 5   spironolactone (ALDACTONE) 25 MG tablet, TAKE ONE TABLET BY MOUTH AT BEDTIME, Disp: 30 tablet, Rfl: 10 No Known Allergies   Social History   Socioeconomic History   Marital status: Single    Spouse name: Not on file   Number of children: Not on file   Years of education: Not on file    Highest education level: Not on file  Occupational History   Not on file  Tobacco Use   Smoking status: Former    Packs/day: 0.50    Pack years: 0.00    Types: Cigarettes   Smokeless tobacco: Never  Vaping Use   Vaping Use: Never used  Substance and Sexual Activity   Alcohol use: Yes    Comment: daily   Drug use: Yes    Types: Marijuana   Sexual activity: Not on file  Other Topics Concern   Not on file  Social History Narrative   Not on file   Social Determinants of Health   Financial Resource Strain: Not on file  Food Insecurity: Not on file  Transportation Needs: Not on file  Physical Activity: Not on file  Stress: Not on file  Social Connections: Not on file  Intimate Partner Violence: Not on file    Physical Exam Vitals reviewed.  Constitutional:      Appearance: Normal appearance. He is normal weight.  HENT:     Head: Normocephalic.     Nose: Nose normal.     Mouth/Throat:     Mouth: Mucous membranes are moist.     Pharynx: Oropharynx is clear.  Eyes:     Pupils: Pupils are equal, round, and reactive to light.  Cardiovascular:  Rate and Rhythm: Normal rate and regular rhythm.     Pulses: Normal pulses.     Heart sounds: Normal heart sounds.  Pulmonary:     Effort: Pulmonary effort is normal. No respiratory distress.     Breath sounds: Normal breath sounds.  Abdominal:     General: Abdomen is flat.     Palpations: Abdomen is soft.  Musculoskeletal:        General: No swelling. Normal range of motion.     Cervical back: Normal range of motion.     Right lower leg: No edema.     Left lower leg: No edema.  Skin:    General: Skin is warm and dry.     Capillary Refill: Capillary refill takes less than 2 seconds.  Neurological:     General: No focal deficit present.     Mental Status: He is alert. Mental status is at baseline.  Psychiatric:        Mood and Affect: Mood normal.   Arrived for home visit for Jeremy Powers who was alert and oriented reporting  to be feeling much better this week. Jeremy Powers said after his hospital stay he has been feeling better and his breathing has gotten a lot better. Jeremy Powers's breathing was not labored upon walking around his apartment. Lung sounds clear. Vitals as noted. No edema noted, no JVD. Jeremy Powers reports he has been taking his medications but received new bubble packs and got confused on which ones to take. I reviewed bubble packs and coached Jeremy Powers on which ones to use and he understood. I labeled the morning and evening packs to assist in decreasing confusion. Jeremy Powers was able to pick out which ones to take and how to take them within the bubble packs. I took old bubble packs to [facility] for pharmacist to discard properly. Jeremy Powers was grateful foe this. I plan on seeing Jeremy Powers in two weeks in clinic. He agreed with plan. Home visit complete.   Refills: NONE    Future Appointments  Date Time Provider Department Center  11/14/2020 11:30 AM [facility] PA/NP [facility] None     ACTION: Home visit completed

## 2020-11-11 ENCOUNTER — Telehealth (HOSPITAL_COMMUNITY): Payer: Self-pay

## 2020-11-11 NOTE — Telephone Encounter (Signed)
Spoke to Jeremy Powers today who reports Jeremy Powers was up all night coughing with shortness of breath. Jeremy Powers's Powers states they gave him a DuoNeb treatment and it helped Jeremy Powers breath better. They also report Jeremy Powers has not been compliant with his medications and his legs are swollen. I stressed to him the importance of Jeremy Powers taking his medications and to be careful with giving him a breathing treatment when having heart failure. Jeremy Powers Powers agreed and understood. I reminded him of Jeremy Powers appointment Monday at 1130 in the clinic where I will meet him there. Call complete.

## 2020-11-14 ENCOUNTER — Ambulatory Visit (HOSPITAL_COMMUNITY)
Admission: RE | Admit: 2020-11-14 | Discharge: 2020-11-14 | Disposition: A | Discharge status: Home / Self Care | Payer: Medicare Other | Source: Ambulatory Visit | Attending: Family Medicine | Admitting: Family Medicine

## 2020-11-14 ENCOUNTER — Other Ambulatory Visit (HOSPITAL_COMMUNITY): Payer: Self-pay | Admitting: Internal Medicine

## 2020-11-14 ENCOUNTER — Telehealth (HOSPITAL_COMMUNITY): Payer: Self-pay

## 2020-11-14 ENCOUNTER — Encounter (HOSPITAL_COMMUNITY): Payer: Self-pay

## 2020-11-14 ENCOUNTER — Other Ambulatory Visit: Payer: Self-pay

## 2020-11-14 VITALS — Wt 121.2 lb

## 2020-11-14 DIAGNOSIS — I2510 Atherosclerotic heart disease of native coronary artery without angina pectoris: Secondary | ICD-10-CM | POA: Diagnosis not present

## 2020-11-14 DIAGNOSIS — Z7982 Long term (current) use of aspirin: Secondary | ICD-10-CM | POA: Insufficient documentation

## 2020-11-14 DIAGNOSIS — Z7984 Long term (current) use of oral hypoglycemic drugs: Secondary | ICD-10-CM | POA: Diagnosis not present

## 2020-11-14 DIAGNOSIS — I447 Left bundle-branch block, unspecified: Secondary | ICD-10-CM | POA: Insufficient documentation

## 2020-11-14 DIAGNOSIS — Z951 Presence of aortocoronary bypass graft: Secondary | ICD-10-CM | POA: Insufficient documentation

## 2020-11-14 DIAGNOSIS — Z79899 Other long term (current) drug therapy: Secondary | ICD-10-CM | POA: Insufficient documentation

## 2020-11-14 DIAGNOSIS — I10 Essential (primary) hypertension: Secondary | ICD-10-CM

## 2020-11-14 DIAGNOSIS — Z955 Presence of coronary angioplasty implant and graft: Secondary | ICD-10-CM | POA: Insufficient documentation

## 2020-11-14 DIAGNOSIS — Z87891 Personal history of nicotine dependence: Secondary | ICD-10-CM | POA: Insufficient documentation

## 2020-11-14 DIAGNOSIS — I428 Other cardiomyopathies: Secondary | ICD-10-CM | POA: Insufficient documentation

## 2020-11-14 DIAGNOSIS — I5022 Chronic systolic (congestive) heart failure: Secondary | ICD-10-CM | POA: Diagnosis present

## 2020-11-14 DIAGNOSIS — I110 Hypertensive heart disease with heart failure: Secondary | ICD-10-CM | POA: Diagnosis not present

## 2020-11-14 DIAGNOSIS — Z550 Illiteracy and low-level literacy: Secondary | ICD-10-CM

## 2020-11-14 DIAGNOSIS — Z720 Tobacco use: Secondary | ICD-10-CM

## 2020-11-14 NOTE — Addendum Note (Signed)
Encounter addended by: [name], CMA on: 11/14/2020 11:35 AM  Actions taken: Clinical Note Signed

## 2020-11-14 NOTE — Progress Notes (Signed)
Heart Failure TeleHealth Note  Due to national recommendations of social distancing due to COVID 19, Audio telehealth visit is felt to be most appropriate for this patient at this time.  See MyChart message from today for patient consent regarding telehealth for [facility].  Date:  11/14/2020   ID:  Jeremy Powers, DOB 1955/09/10, MRN [id number]  Location: Home  Provider location: [place] [facility] Type of Visit: Established patient   PCP:  Jeremy Richters, PA-C  Cardiologist:  None Primary HF: Jeremy Powers  Chief Complaint:  Chief Complaint  Patient presents with   Other    Virtual visit 4 weeks f/u   CC: Heart Failure Follow Up   History of Present Illness: Jeremy Powers is a 66 y.o. male CAD s/p PCI to LAD in 2015 and 2016, s/p CABG 2017, systolic HF EF 20-25%, microcytic/iron deficiency anemia followed by hematology and GI, LBBB, and tobacco use. Illiterate and cannot read.   He presents via Special educational needs teacher for a telehealth visit today.     he endorses symptoms worrisome for COVID 19.   In 2/20 he presented to [facility] ED with SOB, cough, and orthopnea in setting of med non-compliance. Was unable to afford copays with Medicaid. He was tachycardic and tachypneic on arrival, and was transferred to [facility] 06/22/18 for further evaluation. Echo performed which showed newly decreased EF of 25-30% from 50-55% in 12/2017.  Taken for [facility] which showed severe 1 v disease with occluded LAD within previous stent. RCA and LCX normal. LIMA to LAD and SVG to D2 patent. Cath also showed low filling pressures with moderately low cardiac output. Meds adjusted as tolerated.   OV 04/05/20 returns for HF follow up after not being seen in > 1 year. Lives with his girlfriend. Followed by HF Paramedicine. Lasix was stopped & [name] was started.  OV 07/15/20 Echo was completed LVEF 20-25%, RV ok. He was instructed to increase Entresto to 49-51. Unfortunately, refill was sent to wrong  pharmacy. We contacted pharmacy today that dispenses his pill packs and he has currently been taking 24-26 mg dose.    3/22 he was discharged from community paramedicine program and bubble pill packs arranged.   He returned for HF follow up 5/22. Here by himself. Denies resting dyspnea, orthopnea/PND. Has exertional dyspnea w/ moderate level activity. No CP. BP elevated at 152/86.  He reports he took his AM meds today but forgot to take his meds last PM. ReDs clip 33%.  He smells of smoke but denies use. He reports his roommate smokes. Denies ETOH use.   Seen in the ED 10/25/20 for CHF exacerbation in the setting of medication non-compliance. He was diuresed with IV lasix and discharged home.  Today he presents via phone for HF follow up. Family member called and was concerned Jeremy Powers may have COVID. Overall feeling fine, some dyspnea with moderate activity. Denies increasing SOB, CP, dizziness, edema, or PND/Orthopnea. Appetite ok. No fever or chills. Says he is taking all medications, but endorses missing 2 days of meds. 1 ETOH/day. Eating bologna & spam sandwiches. Followed now by Paramedicine.  Echo 06/2920: EF 20-25%, RV ok  Echo 01/2019: EF 20-25%  Echo 06/2018: EF 20-25%  cMRI 06/27/18: EF 24%. Normal RV   RHC/LHC 06/24/2018  RA = 1 RV = 19/3 PA = 24/5 (14) PCW = 7 Fick cardiac output/index = 4.0/2.5 PVR = 1.9 WU Ao sat = 95% PA sat = 68%, 71%  Assessment: 1. Severe 1-vessel CAD with occluded LAD within previous stent 2. Normal RCA and LCX 3. LIMA to LAD widely patent 4. SVG - D2 widely patent 5. SVG - D1 likely occluded (grafts not marked)  ROS: All systems negative except as listed in HPI, PMH and Problem List.  Past Medical History:  Diagnosis Date   CHF (congestive heart failure) (HCC)    Cirrhosis (HCC)    Coronary artery disease    Hypertension    Pancreatitis    Past Surgical History:  Procedure Laterality Date   CARDIAC SURGERY     RIGHT/LEFT HEART CATH AND  CORONARY/GRAFT ANGIOGRAPHY N/A 06/24/2018   Procedure: RIGHT/LEFT HEART CATH AND CORONARY/GRAFT ANGIOGRAPHY;  Surgeon: Jeremy Patty, MD;  Location: [facility] INVASIVE CV LAB;  Service: Cardiovascular;  Laterality: N/A;   Current Outpatient Medications  Medication Sig Dispense Refill   aspirin EC 81 MG tablet Take 81 mg by mouth daily.     atorvastatin (LIPITOR) 40 MG tablet TAKE 1 TABLET BY MOUTH DAILY AT 6PM 30 tablet 5   carvedilol (COREG) 3.125 MG tablet Take 1 tablet (3.125 mg total) by mouth 2 (two) times daily with a meal. 180 tablet 3   FARXIGA 10 MG TABS tablet TAKE 1 TABLET BY MOUTH EVERY MORNING BEFORE BREAKFAST 30 tablet 3   FEROSUL 325 (65 Fe) MG tablet TAKE 1 TABLET BY MOUTH TWICE DAILY WITH A MEAL 180 tablet 3   furosemide (LASIX) 40 MG tablet Take 1 tablet (40 mg total) by mouth daily. 30 tablet 0   potassium chloride SA (KLOR-CON) 20 MEQ tablet Take 3 tablets (60 mEq total) by mouth 2 (two) times daily. 180 tablet 11   sacubitril-valsartan (ENTRESTO) 49-51 MG Take 1 tablet by mouth 2 (two) times daily. 60 tablet 5   sertraline (ZOLOFT) 25 MG tablet Take 1 tablet (25 mg total) by mouth daily. 30 tablet 5   spironolactone (ALDACTONE) 25 MG tablet TAKE ONE TABLET BY MOUTH AT BEDTIME 30 tablet 10   No current facility-administered medications for this encounter.   Allergies:   Patient has no known allergies.   Social History:  The patient  reports that he has quit smoking. His smoking use included cigarettes. He smoked an average of 0.50 packs per day. He has never used smokeless tobacco. He reports current alcohol use. He reports current drug use. Drug: Marijuana.   Family History:  The patient's family history is not on file.   ROS:  Please see the history of present illness.   All other systems are personally reviewed and negative.   Exam:  Tele Health Call; Exam is subjective  General:  Speaks in full sentences. No resp difficulty. Lungs: Normal respiratory effort with  conversation.  Abdomen: Non-distended per patient report Extremities: Pt denies edema. Neuro: Alert & oriented x 3.   Recent Labs: 04/28/2020: Pro B Natriuretic peptide (BNP) 717.0 10/24/2020: ALT 11; B Natriuretic Peptide 2,439.0; BUN 6; Creatinine, Ser 0.65; Hemoglobin 11.8; Platelets 238; Potassium 3.1; Sodium 140  Personally reviewed   Wt 55 kg (121 lb 3.2 oz)   BMI 19.56 kg/m   Wt Readings from Last 3 Encounters:  11/14/20 55 kg (121 lb 3.2 oz)  11/01/20 56.3 kg (124 lb 3.2 oz)  10/12/20 58.9 kg (129 lb 12.8 oz)    ASSESSMENT AND PLAN:  1.  Chronic Systolic Heart Failure, NICM - Echo 06/2018 EF 25-30%,  cMRI 06/27/2018 with EF 24%, Echo 01/2019 EF 20-25% - Echo 07/15/20 EF 20-25% RV  ok  - NYHA II-early III. Weight stable - Continue Farxiga 10 mg daily. - Continue carvedilol 6.25 mg bid. - Continue Entresto 49-51 mg bid (confirmed w/ pharmacy, he has currently been taking 24-26 dose). - Continue spironolactone 25 mg daily at bedtime.  - Refer for CRT-D with wide LBBB. - Continue with Paramedicine. - Reinforced limiting high salt foods. - Check BMET & BNP.  2. CAD - S/P CABG 2017, LHC 06/2018  with occluded LAD normal LCX and RCA. LIMA to LAD and SVG to D2 patent (SVG to D1 occluded) - Stable w/o CP.  - Continue aspirin 81 mg daily + atorvastatin.      3. LBBB - EKG QRS 170 ms.  - He was previously referred to EP for CRT-D with wide LBBB. No appt made yet.  - Will check on status of referral.     4.  Tobacco Abuse - No longer smoking. Lives with people who do smoke.   5. HTN - Continue current, unable to check BP today. - If remains elevated, increase Entresto.  6. Illiterate - Cannot read. Able to write numbers. Needs assistance with medications. Has pill packs but still struggling w/ meds. I'm still concerned about his compliance and poor health literacy. Appreciated Paramedicine's assistance.   F/u w/ APP in 3-4 weeks   Jacklynn Ganong,  FNP-BC 11/14/2020   COVID screen The patient does not have any symptoms that suggest any further testing/ screening at this time.  Social distancing reinforced today.  Patient Risk: After full review of this patients clinical status, I feel that they are at moderate risk for cardiac decompensation at this time.  Relevant cardiac medications were reviewed at length with the patient today. The patient does not have concerns regarding their medications at this time.   The following changes were made today:  No medication changes made today, will check BMET & BNP. Re-educated on importance of limiting high salt foods.  Recommended follow-up:  3 weeks with APP  Today, I have spent 22 minutes with the patient with telehealth technology discussing the above issues .    Cydney Ok, FNP  11/14/2020 11:01 AM  [facility] [address] Heart and Vascular [place] [address] [phone] (office) [phone] (fax)

## 2020-11-14 NOTE — Telephone Encounter (Signed)
Spoke to Ty, Normal's son who reports Richrd is likely Covid positive including the rest of his family members. I referred Ty to call Jailin's PCP and follow up with them and I will be following accordingly. I made clinic staff aware. Call complete.

## 2020-11-14 NOTE — Patient Instructions (Addendum)
It was great to speak with you today! No medication changes are needed at this time.   Labs needed in 10 days  You have been referred to [facility]rophys[facility] -they will be in contact with an appointment    Your physician recommends that you schedule a follow-up appointment in: 3-4 weeks  in the Advanced Practitioners (PA/NP) Clinic    Do the following things EVERYDAY: Weigh yourself in the morning before breakfast. Write it down and keep it in a log. Take your medicines as prescribed Eat low salt foods--Limit salt (sodium) to 2000 mg per day.  Stay as active as you can everyday Limit all fluids for the day to less than 2 liters  At the [facility], you and your health needs are our priority. As part of our continuing mission to provide you with exceptional heart care, we have created designated Provider Care Teams. These Care Teams include your primary Cardiologist (physician) and Advanced Practice Providers (APPs- Physician Assistants and Nurse Practitioners) who all work together to provide you with the care you need, when you need it.   You may see any of the following providers on your designated Care Team at your next follow up: Dr Arvilla Meres Dr Marca Ancona Dr Brandon Melnick, NP Robbie Lis, [place] Mikki Santee Karle Plumber, PharmD   Please be sure to bring in all your medications bottles to every appointment.   If you have any questions or concerns before your next appointment please send [place] a message through [place] or call our office at [phone].    TO LEAVE A MESSAGE FOR THE NURSE SELECT OPTION 2, PLEASE LEAVE A MESSAGE INCLUDING: YOUR NAME DATE OF BIRTH CALL BACK NUMBER REASON FOR CALL**this is important as we prioritize the call backs  YOU WILL RECEIVE A CALL BACK THE SAME DAY AS LONG AS YOU CALL BEFORE 4:00 PM

## 2020-11-24 ENCOUNTER — Encounter: Payer: Self-pay | Admitting: Family Medicine

## 2020-11-28 ENCOUNTER — Other Ambulatory Visit (HOSPITAL_COMMUNITY): Payer: Medicare Other

## 2020-11-30 ENCOUNTER — Telehealth (HOSPITAL_COMMUNITY): Payer: Self-pay

## 2020-11-30 NOTE — Telephone Encounter (Signed)
Attempted to reach Jeremy Powers for scheduling home visit for tomorrow. No answer on all phone options, I will try again tomorrow.

## 2020-12-07 ENCOUNTER — Telehealth (HOSPITAL_COMMUNITY): Payer: Self-pay

## 2020-12-07 NOTE — Telephone Encounter (Signed)
Reached out to Hulan Fray and [name]'s daughter in attempt to talk with Jonny Ruiz with no success. I will continue to attempt same. Messages left on VM's.

## 2020-12-13 ENCOUNTER — Telehealth (HOSPITAL_COMMUNITY): Payer: Self-pay

## 2020-12-13 NOTE — Telephone Encounter (Signed)
I called Mr Yabut to let him know Herbert Seta would be out of the office this week and see if there was anything I could help him with over the phone. His phone went directly to voicemail so I left a message with the aforementioned information.   Jacqualine Code, EMT 12/13/20

## 2020-12-16 ENCOUNTER — Encounter (HOSPITAL_COMMUNITY): Payer: Medicare Other

## 2020-12-22 ENCOUNTER — Other Ambulatory Visit: Payer: Self-pay

## 2020-12-22 ENCOUNTER — Telehealth: Payer: Self-pay | Admitting: Medical

## 2020-12-22 ENCOUNTER — Other Ambulatory Visit (HOSPITAL_COMMUNITY): Payer: Self-pay

## 2020-12-22 ENCOUNTER — Telehealth (HOSPITAL_COMMUNITY): Payer: Self-pay

## 2020-12-22 MED ORDER — SERTRALINE HCL 25 MG PO TABS: 25.0000 mg | ORAL_TABLET | Freq: Every day | ORAL | 5 refills | Status: DC

## 2020-12-22 NOTE — Progress Notes (Signed)
Paramedicine Encounter    Patient ID: Jeremy Powers, male    DOB: Jul 07, 1955, 65 y.o.   MRN: [id number]   Patient Care Team: [name], Cordelia Poche as PCP - General (Internal Medicine)  Patient Active Problem List   Diagnosis Date Noted   Acute systolic (congestive) heart failure (HCC) 06/24/2018   Aortic insufficiency 06/22/2018   Acute on chronic congestive heart failure (HCC) 06/22/2018   Elevated troponin 06/22/2018   CAD (coronary artery disease) 06/22/2018   Left bundle branch block 06/22/2018   History of iron deficiency anemia 06/22/2018   Acute on chronic heart failure (HCC) 06/22/2018    Current Outpatient Medications:    aspirin EC 81 MG tablet, Take 81 mg by mouth daily., Disp: , Rfl:    atorvastatin (LIPITOR) 40 MG tablet, TAKE ONE TABLET BY MOUTH EVERYDAY TABLET 6PM FOR CHOLESTORL, Disp: 30 tablet, Rfl: 1   carvedilol (COREG) 3.125 MG tablet, Take 1 tablet (3.125 mg total) by mouth 2 (two) times daily with a meal., Disp: 180 tablet, Rfl: 3   FARXIGA 10 MG TABS tablet, TAKE ONE TABLET BY MOUTH DAILY IN THE MORNING BEFORE BREAKFAST, Disp: 30 tablet, Rfl: 1   FEROSUL 325 (65 Fe) MG tablet, TAKE 1 TABLET BY MOUTH TWICE DAILY WITH A MEAL, Disp: 180 tablet, Rfl: 3   furosemide (LASIX) 40 MG tablet, Take 1 tablet (40 mg total) by mouth daily., Disp: 30 tablet, Rfl: 0   potassium chloride SA (KLOR-CON) 20 MEQ tablet, Take 3 tablets (60 mEq total) by mouth 2 (two) times daily., Disp: 180 tablet, Rfl: 11   sacubitril-valsartan (ENTRESTO) 49-51 MG, Take 1 tablet by mouth 2 (two) times daily., Disp: 60 tablet, Rfl: 5   sertraline (ZOLOFT) 25 MG tablet, Take 1 tablet (25 mg total) by mouth daily., Disp: 30 tablet, Rfl: 5   spironolactone (ALDACTONE) 25 MG tablet, TAKE ONE TABLET BY MOUTH AT BEDTIME, Disp: 30 tablet, Rfl: 10 No Known Allergies   Social History   Socioeconomic History   Marital status: Single    Spouse name: Not on file   Number of children: Not on file   Years  of education: Not on file   Highest education level: Not on file  Occupational History   Not on file  Tobacco Use   Smoking status: Former    Packs/day: 0.50    Types: Cigarettes   Smokeless tobacco: Never  Vaping Use   Vaping Use: Never used  Substance and Sexual Activity   Alcohol use: Yes    Comment: daily   Drug use: Yes    Types: Marijuana   Sexual activity: Not on file  Other Topics Concern   Not on file  Social History Narrative   Not on file   Social Determinants of Health   Financial Resource Strain: Not on file  Food Insecurity: Not on file  Transportation Needs: Not on file  Physical Activity: Not on file  Stress: Not on file  Social Connections: Not on file  Intimate Partner Violence: Not on file    Physical Exam Vitals reviewed.  Constitutional:      Appearance: He is normal weight.  HENT:     Head: Normocephalic.     Nose: Congestion present.     Mouth/Throat:     Mouth: Mucous membranes are moist.     Pharynx: Oropharynx is clear.  Eyes:     Conjunctiva/sclera: Conjunctivae normal.     Pupils: Pupils are equal, round, and reactive to light.  Cardiovascular:     Rate and Rhythm: Normal rate and regular rhythm.     Pulses: Normal pulses.     Heart sounds: Normal heart sounds.  Pulmonary:     Effort: Pulmonary effort is normal.     Breath sounds: Wheezing present.  Abdominal:     General: Abdomen is flat.     Palpations: Abdomen is soft.  Musculoskeletal:        General: No swelling. Normal range of motion.     Right lower leg: No edema.     Left lower leg: No edema.  Skin:    General: Skin is warm and dry.     Capillary Refill: Capillary refill takes less than 2 seconds.  Neurological:     General: No focal deficit present.     Mental Status: He is alert. Mental status is at baseline.  Psychiatric:        Mood and Affect: Mood normal.   Arrived for home visit for Jeremy Powers who was walking in his kitchen to weigh himself. He was not short of  breath upon ambulating but did have a productive cough in which he says he has had for several weeks. He has not been evaluated for this. Lung sounds revealed wheezes globally. Jeremy Powers denied chest pain, dizziness but stated he gets short of breath when he walks long distances but says he can sleep with one or two pillows. I contacted his PCP and scheduled an appointment for tomorrow at 11. Jeremy Powers agreed with plan.   Jeremy Powers gets bubble packs delivered from [employer] however he has not been compliant with them. Our last visit was over a month ago and he has only taken 5 doses of medications from these bubble packs. I explained in depth the importance of Jeremy Powers taking his medications and he states he understands and will do better. Family members at the house also say they have tried to get Jeremy Powers to take his medications but he chooses not to.   I reminded Jeremy Powers of his appointment with HF clinic on 8/16. I will be there with him. Family agreed they will have transportation set up.   I advised Jeremy Powers if his cough or breathing worsened to call EMS, he understood.   Home visit complete.      Future Appointments  Date Time Provider Department Center  01/03/2021 11:30 AM [facility] PA/NP [facility] None     ACTION: Home visit completed

## 2020-12-22 NOTE — Telephone Encounter (Signed)
Called to schedule visit for Jeremy Powers. I will see him at 0930 today.

## 2020-12-23 ENCOUNTER — Encounter (HOSPITAL_BASED_OUTPATIENT_CLINIC_OR_DEPARTMENT_OTHER): Payer: Self-pay | Admitting: *Deleted

## 2020-12-23 ENCOUNTER — Emergency Department (HOSPITAL_BASED_OUTPATIENT_CLINIC_OR_DEPARTMENT_OTHER): Payer: Medicare Other

## 2020-12-23 ENCOUNTER — Inpatient Hospital Stay (HOSPITAL_BASED_OUTPATIENT_CLINIC_OR_DEPARTMENT_OTHER)
Admission: EM | Admit: 2020-12-23 | Discharge: 2020-12-27 | DRG: 291 | Disposition: A | Discharge status: Home / Self Care | Payer: Medicare Other | Attending: Internal Medicine | Admitting: Internal Medicine

## 2020-12-23 ENCOUNTER — Ambulatory Visit (HOSPITAL_BASED_OUTPATIENT_CLINIC_OR_DEPARTMENT_OTHER)
Admission: RE | Admit: 2020-12-23 | Discharge: 2020-12-23 | Disposition: A | Discharge status: Home / Self Care | Payer: Medicare Other | Source: Ambulatory Visit | Attending: Medical | Admitting: Medical

## 2020-12-23 ENCOUNTER — Other Ambulatory Visit: Payer: Self-pay

## 2020-12-23 ENCOUNTER — Ambulatory Visit (INDEPENDENT_AMBULATORY_CARE_PROVIDER_SITE_OTHER): Payer: Medicare Other | Admitting: Medical

## 2020-12-23 ENCOUNTER — Telehealth: Payer: Self-pay

## 2020-12-23 VITALS — BP 126/69 | HR 89 | Resp 20 | Ht 66.0 in | Wt 135.0 lb

## 2020-12-23 DIAGNOSIS — I5084 End stage heart failure: Secondary | ICD-10-CM | POA: Diagnosis present

## 2020-12-23 DIAGNOSIS — I509 Heart failure, unspecified: Secondary | ICD-10-CM | POA: Diagnosis not present

## 2020-12-23 DIAGNOSIS — Z7984 Long term (current) use of oral hypoglycemic drugs: Secondary | ICD-10-CM

## 2020-12-23 DIAGNOSIS — D649 Anemia, unspecified: Secondary | ICD-10-CM

## 2020-12-23 DIAGNOSIS — R0602 Shortness of breath: Secondary | ICD-10-CM | POA: Diagnosis not present

## 2020-12-23 DIAGNOSIS — Z951 Presence of aortocoronary bypass graft: Secondary | ICD-10-CM

## 2020-12-23 DIAGNOSIS — I2510 Atherosclerotic heart disease of native coronary artery without angina pectoris: Secondary | ICD-10-CM

## 2020-12-23 DIAGNOSIS — I5043 Acute on chronic combined systolic (congestive) and diastolic (congestive) heart failure: Secondary | ICD-10-CM

## 2020-12-23 DIAGNOSIS — I447 Left bundle-branch block, unspecified: Secondary | ICD-10-CM | POA: Diagnosis not present

## 2020-12-23 DIAGNOSIS — Z7982 Long term (current) use of aspirin: Secondary | ICD-10-CM

## 2020-12-23 DIAGNOSIS — R0609 Other forms of dyspnea: Secondary | ICD-10-CM

## 2020-12-23 DIAGNOSIS — J45909 Unspecified asthma, uncomplicated: Secondary | ICD-10-CM

## 2020-12-23 DIAGNOSIS — Z79899 Other long term (current) drug therapy: Secondary | ICD-10-CM

## 2020-12-23 DIAGNOSIS — I110 Hypertensive heart disease with heart failure: Principal | ICD-10-CM | POA: Diagnosis present

## 2020-12-23 DIAGNOSIS — U071 COVID-19: Secondary | ICD-10-CM | POA: Diagnosis present

## 2020-12-23 DIAGNOSIS — I5082 Biventricular heart failure: Secondary | ICD-10-CM | POA: Diagnosis present

## 2020-12-23 DIAGNOSIS — I351 Nonrheumatic aortic (valve) insufficiency: Secondary | ICD-10-CM | POA: Diagnosis present

## 2020-12-23 DIAGNOSIS — I959 Hypotension, unspecified: Secondary | ICD-10-CM | POA: Diagnosis not present

## 2020-12-23 DIAGNOSIS — R059 Cough, unspecified: Secondary | ICD-10-CM

## 2020-12-23 DIAGNOSIS — K7460 Unspecified cirrhosis of liver: Secondary | ICD-10-CM | POA: Diagnosis present

## 2020-12-23 DIAGNOSIS — I5023 Acute on chronic systolic (congestive) heart failure: Secondary | ICD-10-CM | POA: Diagnosis present

## 2020-12-23 DIAGNOSIS — I080 Rheumatic disorders of both mitral and aortic valves: Secondary | ICD-10-CM | POA: Diagnosis present

## 2020-12-23 DIAGNOSIS — R600 Localized edema: Secondary | ICD-10-CM | POA: Insufficient documentation

## 2020-12-23 DIAGNOSIS — D509 Iron deficiency anemia, unspecified: Secondary | ICD-10-CM | POA: Diagnosis present

## 2020-12-23 DIAGNOSIS — Z87891 Personal history of nicotine dependence: Secondary | ICD-10-CM

## 2020-12-23 DIAGNOSIS — Z955 Presence of coronary angioplasty implant and graft: Secondary | ICD-10-CM

## 2020-12-23 DIAGNOSIS — Z9114 Patient's other noncompliance with medication regimen: Secondary | ICD-10-CM

## 2020-12-23 DIAGNOSIS — R0600 Dyspnea, unspecified: Secondary | ICD-10-CM

## 2020-12-23 DIAGNOSIS — I428 Other cardiomyopathies: Secondary | ICD-10-CM | POA: Diagnosis present

## 2020-12-23 DIAGNOSIS — I340 Nonrheumatic mitral (valve) insufficiency: Secondary | ICD-10-CM | POA: Diagnosis present

## 2020-12-23 DIAGNOSIS — F32A Depression, unspecified: Secondary | ICD-10-CM | POA: Diagnosis present

## 2020-12-23 DIAGNOSIS — E876 Hypokalemia: Secondary | ICD-10-CM | POA: Diagnosis not present

## 2020-12-23 DIAGNOSIS — E785 Hyperlipidemia, unspecified: Secondary | ICD-10-CM | POA: Diagnosis present

## 2020-12-23 LAB — PHOSPHORUS: Phosphorus: 2.6 mg/dL (ref 2.5–4.6)

## 2020-12-23 LAB — LACTIC ACID, PLASMA
Lactic Acid, Venous: 2.4 mmol/L (ref 0.5–1.9)
Lactic Acid, Venous: 2.5 mmol/L (ref 0.5–1.9)

## 2020-12-23 LAB — CBC WITH DIFFERENTIAL/PLATELET
Abs Immature Granulocytes: 0.03 10*3/uL (ref 0.00–0.07)
Basophils Absolute: 0 10*3/uL (ref 0.0–0.1)
Basophils Absolute: 0 10*3/uL (ref 0.0–0.1)
Basophils Relative: 0.6 % (ref 0.0–3.0)
Basophils Relative: 1 %
Eosinophils Absolute: 0 10*3/uL (ref 0.0–0.7)
Eosinophils Absolute: 0.1 10*3/uL (ref 0.0–0.5)
Eosinophils Relative: 1 % (ref 0.0–5.0)
Eosinophils Relative: 1 %
HCT: 33.2 % — ABNORMAL LOW (ref 39.0–52.0)
HCT: 33.2 % — ABNORMAL LOW (ref 39.0–52.0)
Hemoglobin: 10.8 g/dL — ABNORMAL LOW (ref 13.0–17.0)
Hemoglobin: 11.2 g/dL — ABNORMAL LOW (ref 13.0–17.0)
Immature Granulocytes: 1 %
Lymphocytes Relative: 17.7 % (ref 12.0–46.0)
Lymphocytes Relative: 19 %
Lymphs Abs: 0.8 10*3/uL (ref 0.7–4.0)
Lymphs Abs: 1 10*3/uL (ref 0.7–4.0)
MCH: 30.8 pg (ref 26.0–34.0)
MCHC: 32.4 g/dL (ref 30.0–36.0)
MCHC: 33.7 g/dL (ref 30.0–36.0)
MCV: 94.1 fl (ref 78.0–100.0)
MCV: 91.2 fL (ref 80.0–100.0)
Monocytes Absolute: 0.6 10*3/uL (ref 0.1–1.0)
Monocytes Absolute: 0.7 10*3/uL (ref 0.1–1.0)
Monocytes Relative: 12.3 % — ABNORMAL HIGH (ref 3.0–12.0)
Monocytes Relative: 14 %
Neutro Abs: 3.2 10*3/uL (ref 1.4–7.7)
Neutro Abs: 3.4 10*3/uL (ref 1.7–7.7)
Neutrophils Relative %: 68.4 % (ref 43.0–77.0)
Neutrophils Relative %: 64 %
Platelets: 238 10*3/uL (ref 150.0–400.0)
Platelets: 263 10*3/uL (ref 150–400)
RBC: 3.53 Mil/uL — ABNORMAL LOW (ref 4.22–5.81)
RBC: 3.64 MIL/uL — ABNORMAL LOW (ref 4.22–5.81)
RDW: 21.3 % — ABNORMAL HIGH (ref 11.5–15.5)
RDW: 21.1 % — ABNORMAL HIGH (ref 11.5–15.5)
Smear Review: NORMAL
WBC: 4.7 10*3/uL (ref 4.0–10.5)
WBC: 5.1 10*3/uL (ref 4.0–10.5)
nRBC: 0 % (ref 0.0–0.2)

## 2020-12-23 LAB — COMPREHENSIVE METABOLIC PANEL
ALT: 10 U/L (ref 0–53)
ALT: 12 U/L (ref 0–44)
AST: 25 U/L (ref 0–37)
AST: 29 U/L (ref 15–41)
Albumin: 3.3 g/dL — ABNORMAL LOW (ref 3.5–5.2)
Albumin: 3.4 g/dL — ABNORMAL LOW (ref 3.5–5.0)
Alkaline Phosphatase: 81 U/L (ref 39–117)
Alkaline Phosphatase: 77 U/L (ref 38–126)
Anion gap: 11 (ref 5–15)
BUN: 9 mg/dL (ref 6–23)
BUN: 9 mg/dL (ref 8–23)
CO2: 26 mEq/L (ref 19–32)
CO2: 24 mmol/L (ref 22–32)
Calcium: 8.4 mg/dL (ref 8.4–10.5)
Calcium: 8.2 mg/dL — ABNORMAL LOW (ref 8.9–10.3)
Chloride: 103 mEq/L (ref 96–112)
Chloride: 102 mmol/L (ref 98–111)
Creatinine, Ser: 0.68 mg/dL (ref 0.40–1.50)
Creatinine, Ser: 0.66 mg/dL (ref 0.61–1.24)
GFR, Estimated: >60 mL/min (ref >60)
GFR: 96.55 mL/min (ref >60.00)
Glucose, Bld: 85 mg/dL (ref 70–99)
Glucose, Bld: 81 mg/dL (ref 70–99)
Potassium: 3.1 mEq/L — ABNORMAL LOW (ref 3.5–5.1)
Potassium: 2.7 mmol/L — CL (ref 3.5–5.1)
Sodium: 140 mEq/L (ref 135–145)
Sodium: 137 mmol/L (ref 135–145)
Total Bilirubin: 1.4 mg/dL — ABNORMAL HIGH (ref 0.2–1.2)
Total Bilirubin: 1.4 mg/dL — ABNORMAL HIGH (ref 0.3–1.2)
Total Protein: 7.2 g/dL (ref 6.0–8.3)
Total Protein: 7.8 g/dL (ref 6.5–8.1)

## 2020-12-23 LAB — PROTIME-INR
INR: 1.3 — ABNORMAL HIGH (ref 0.8–1.2)
Prothrombin Time: 16 seconds — ABNORMAL HIGH (ref 11.4–15.2)

## 2020-12-23 LAB — RESP PANEL BY RT-PCR (FLU A&B, COVID) ARPGX2
Influenza A by PCR: NEGATIVE
Influenza B by PCR: NEGATIVE
SARS Coronavirus 2 by RT PCR: POSITIVE — AB

## 2020-12-23 LAB — MAGNESIUM: Magnesium: 1.8 mg/dL (ref 1.7–2.4)

## 2020-12-23 LAB — BRAIN NATRIURETIC PEPTIDE
B Natriuretic Peptide: >4500 pg/mL — ABNORMAL HIGH (ref 0.0–100.0)
Pro B Natriuretic peptide (BNP): >4833 pg/mL — ABNORMAL HIGH (ref 0.0–100.0)

## 2020-12-23 LAB — TROPONIN I (HIGH SENSITIVITY)
High Sens Troponin I: 23 ng/L (ref 2–17)
Troponin I (High Sensitivity): 31 ng/L — ABNORMAL HIGH (ref <18)
Troponin I (High Sensitivity): 32 ng/L — ABNORMAL HIGH (ref <18)

## 2020-12-23 MED ORDER — POTASSIUM CHLORIDE 10 MEQ/100ML IV SOLN
10.0000 meq | INTRAVENOUS | Status: AC
  Administered 2020-12-23 (×3): 10 meq via INTRAVENOUS
  Filled 2020-12-23 (×3): qty 100

## 2020-12-23 MED ORDER — SODIUM CHLORIDE 0.9 % IV SOLN: INTRAVENOUS | Status: DC | PRN

## 2020-12-23 MED ORDER — POTASSIUM CHLORIDE CRYS ER 20 MEQ PO TBCR
40.0000 meq | EXTENDED_RELEASE_TABLET | Freq: Once | ORAL | Status: AC
  Administered 2020-12-23: 40 meq via ORAL
  Filled 2020-12-23: qty 2

## 2020-12-23 MED ORDER — FUROSEMIDE 10 MG/ML IJ SOLN
40.0000 mg | Freq: Once | INTRAMUSCULAR | Status: AC
  Administered 2020-12-23: 40 mg via INTRAVENOUS
  Filled 2020-12-23: qty 4

## 2020-12-23 NOTE — ED Notes (Signed)
ED Provider at bedside.Pfeiffer

## 2020-12-23 NOTE — Telephone Encounter (Signed)
Left message on machine to call back  

## 2020-12-23 NOTE — Progress Notes (Addendum)
Subjective:    Patient ID: Jeremy Powers, male    DOB: June 18, 1955, 65 y.o.   MRN: [id number]  HPI  Pt in for some recent shortness of breath and wheezing. Pt states this has been going on for about 3-4 weeks. Pt does have history of asthma.   On review of pt chart he did have chf flare beginning of June.   64 y.o. yo male presents to the ED for exertional shortness of breath, chest pain, ankle swelling, orthopnea, cough.   Additional information obtained from chart, nursing and triage notes review   Chart review reveals -patient has extensive cardiac history.  Seen recently by cardiology.  Chronic systolic heart failure with EF of 20%.  Has been referred to EP for a defibrillator.  History of CAD/CABG.   Ordered lab, imaging were personally reviewed and interpreted   Labs reveal -BNP 2439.  Troponin was 32.  Stable hemoglobin.  K3.1.  Albumin 3.1.  Creatinine is normal.   Imaging reveals -chest x-ray shows cardiomegaly with small right and trace left pleural effusions.  No overt pulmonary edema.  EKG shows LBBB otherwise unchanged from previous.   Medicines ordered -Lasix   ED course & MDM  2215: Clinical presentation is most consistent with acute CHF exacerbation.  Suspect due to medical noncompliance and diet indiscretion.  I reevaluated patient and SPO2 is 80 to 92% on room air at rest.  He was placed on 2 L [place].  Vital signs otherwise normal.  Pending cardiology consult for admission.   2355: Spoke with cardiology, will see patient.   Looks like after ED visit virtual visit scheduled with pt but never got connected.   Pt smoked since 65 yo and stopped this year. Former 2 pack a day.  On review since ED visit loks like weight has gone up from 124 lb to 135 lb.     Review of Systems  Constitutional:  Negative for chills, fatigue and fever.  HENT:  Negative for congestion, drooling and ear pain.   Respiratory:  Positive for cough. Negative for chest tightness, shortness of  breath and wheezing.        Some coughing when he was in rooom  Cardiovascular:  Negative for chest pain and palpitations.  Gastrointestinal:  Negative for abdominal pain.  Genitourinary:  Negative for dysuria, flank pain and frequency.  Musculoskeletal:  Negative for back pain.  Skin:  Negative for rash.  Neurological:  Negative for dizziness, syncope, weakness, numbness and headaches.  Hematological:  Negative for adenopathy. Does not bruise/bleed easily.  Psychiatric/Behavioral:  Negative for behavioral problems and decreased concentration.     Past Medical History:  Diagnosis Date   CHF (congestive heart failure) (HCC)    Cirrhosis (HCC)    Coronary artery disease    Hypertension    Pancreatitis      Social History   Socioeconomic History   Marital status: Single    Spouse name: Not on file   Number of children: Not on file   Years of education: Not on file   Highest education level: Not on file  Occupational History   Not on file  Tobacco Use   Smoking status: Former    Packs/day: 0.50    Types: Cigarettes   Smokeless tobacco: Never  Vaping Use   Vaping Use: Never used  Substance and Sexual Activity   Alcohol use: Yes    Comment: daily   Drug use: Yes    Types: Marijuana  Sexual activity: Not on file  Other Topics Concern   Not on file  Social History Narrative   Not on file   Social Determinants of Health   Financial Resource Strain: Not on file  Food Insecurity: Not on file  Transportation Needs: Not on file  Physical Activity: Not on file  Stress: Not on file  Social Connections: Not on file  Intimate Partner Violence: Not on file    Past Surgical History:  Procedure Laterality Date   CARDIAC SURGERY     RIGHT/LEFT HEART CATH AND CORONARY/GRAFT ANGIOGRAPHY N/A 06/24/2018   Procedure: RIGHT/LEFT HEART CATH AND CORONARY/GRAFT ANGIOGRAPHY;  Surgeon: Dolores Patty, MD;  Location: [facility];  Service: Cardiovascular;  Laterality: N/A;     No family history on file.  No Known Allergies  Current Outpatient Medications on File Prior to Visit  Medication Sig Dispense Refill   aspirin EC 81 MG tablet Take 81 mg by mouth daily.     atorvastatin (LIPITOR) 40 MG tablet TAKE ONE TABLET BY MOUTH EVERYDAY TABLET 6PM FOR CHOLESTORL 30 tablet 1   carvedilol (COREG) 3.125 MG tablet Take 1 tablet (3.125 mg total) by mouth 2 (two) times daily with a meal. 180 tablet 3   FARXIGA 10 MG TABS tablet TAKE ONE TABLET BY MOUTH DAILY IN THE MORNING BEFORE BREAKFAST 30 tablet 1   FEROSUL 325 (65 Fe) MG tablet TAKE 1 TABLET BY MOUTH TWICE DAILY WITH A MEAL 180 tablet 3   furosemide (LASIX) 40 MG tablet Take 1 tablet (40 mg total) by mouth daily. 30 tablet 0   potassium chloride SA (KLOR-CON) 20 MEQ tablet Take 3 tablets (60 mEq total) by mouth 2 (two) times daily. 180 tablet 11   sacubitril-valsartan (ENTRESTO) 49-51 MG Take 1 tablet by mouth 2 (two) times daily. 60 tablet 5   sertraline (ZOLOFT) 25 MG tablet Take 1 tablet (25 mg total) by mouth daily. 30 tablet 5   spironolactone (ALDACTONE) 25 MG tablet TAKE ONE TABLET BY MOUTH AT BEDTIME 30 tablet 10   No current facility-administered medications on file prior to visit.    BP 126/69   Pulse 89   Resp 20   Ht 5\' 6"  (1.676 m)   Wt 135 lb (61.2 kg)   SpO2 98% Comment: 95-98%  BMI 21.79 kg/m       Objective:   Physical Exam   General Mental Status- Alert. General Appearance- Not in acute distress.   Skin General: Color- Normal Color. Moisture- Normal Moisture.  Neck Carotid Arteries- Normal color. Moisture- Normal Moisture. No carotid bruits. No JVD.  Chest and Lung Exam Auscultation: Breath Sounds:-Normal.  Cardiovascular Auscultation:Rythm- Regular. Murmurs & Other Heart Sounds:Auscultation of the heart reveals- No Murmurs.  Abdomen Inspection:-Inspeection Normal. Palpation/Percussion:Note:No mass. Palpation and Percussion of the abdomen reveal- Non Tender, Non  Distended + BS, no rebound or guarding.  Neurologic Cranial Nerve exam:- CN III-XII intact(No nystagmus), symmetric smile. Strength:- 5/5 equal and symmetric strength both upper and lower extremities.    Lower ext- 1-2 + pedal edema. Negative homans sign.  Pt not coughing.    Assessment & Plan:   History of CHF, low ejection fraction, history of elevated troponins in the past, history of asthma and heavy smoker.  With recent described shortness of breath considering possible CHF flare, asthma flare or COPD exacerbation.  On review of prior labs in the past it looks like he might have had demand ischemia.  Approaching the weekend as evaluate this  patient.  Will get stat BNP, CMP and stat chest x-ray.  Also decided to include stat troponin..  Included CBC as well as this visit history of anemia.  If significantly abnormal labs come back did counsel patient that we will be advising ED evaluation as we approached the weekend.  Some abnormal labs might need to be repeated.  Treatment decisions to be made after imaging and lab review.  Follow-up date to be determined after assessment of work-up and response to treatment.  Note look for patient's labs and x-rays done did confirm that we have contact to call.  Patient was not aware of his cell phone  #but we do have number of his stepdaughter.  Time spent with patient today was  43 minutes which consisted of chart revdiew, discussing diagnosis, work up treatment and documentation.

## 2020-12-23 NOTE — ED Provider Notes (Addendum)
[facility] EMERGENCY DEPARTMENT Provider Note   CSN: [id number] Arrival date & time: 12/23/20  1635     History Chief Complaint  Patient presents with   Shortness of Breath   Wheezing    Jeremy Powers is a 65 y.o. male.  HPI Patient reports he has been short of breath for several days.  He has also had a lot of swelling in his legs.  He reports the swelling in his legs is new.  Was evaluated by his doctor today and found to have some significantly abnormal labs, very high BNP and mildly elevated troponin, and told to come to the emergency department.  He reports that leg swelling seemed of started over the past week.  He reports he can still rest lying flat but he is noticing increased shortness of breath with activity.  He denies any chest pain.  He reports he has had some coughing.  No fevers or body aches.  He has had some slight fatigue.  Patient quit smoking a long time ago.      Past Medical History:  Diagnosis Date   CHF (congestive heart failure) (HCC)    Cirrhosis (HCC)    Coronary artery disease    Hypertension    Pancreatitis     Patient Active Problem List   Diagnosis Date Noted   Acute systolic (congestive) heart failure (HCC) 06/24/2018   Aortic insufficiency 06/22/2018   Acute on chronic congestive heart failure (HCC) 06/22/2018   Elevated troponin 06/22/2018   CAD (coronary artery disease) 06/22/2018   Left bundle branch block 06/22/2018   History of iron deficiency anemia 06/22/2018   Acute on chronic heart failure (HCC) 06/22/2018    Past Surgical History:  Procedure Laterality Date   CARDIAC SURGERY     RIGHT/LEFT HEART CATH AND CORONARY/GRAFT ANGIOGRAPHY N/A 06/24/2018   Procedure: RIGHT/LEFT HEART CATH AND CORONARY/GRAFT ANGIOGRAPHY;  Surgeon: Dolores Patty, MD;  Location: [facility];  Service: Cardiovascular;  Laterality: N/A;       No family history on file.  Social History   Tobacco Use   Smoking status: Former     Packs/day: 0.50    Types: Cigarettes   Smokeless tobacco: Never  Vaping Use   Vaping Use: Never used  Substance Use Topics   Alcohol use: Yes    Comment: daily   Drug use: Yes    Types: Marijuana    Home Medications Prior to Admission medications   Medication Sig Start Date End Date Taking? Authorizing Provider  aspirin EC 81 MG tablet Take 81 mg by mouth daily.    [provider]  atorvastatin (LIPITOR) 40 MG tablet TAKE ONE TABLET BY MOUTH EVERYDAY TABLET 6PM FOR CHOLESTORL 11/15/20   Bensimhon, Bevelyn Buckles, MD  carvedilol (COREG) 3.125 MG tablet Take 1 tablet (3.125 mg total) by mouth 2 (two) times daily with a meal. 06/27/20   Simmons, Brittainy M, PA-C  FARXIGA 10 MG TABS tablet TAKE ONE TABLET BY MOUTH DAILY IN THE MORNING BEFORE BREAKFAST 11/15/20   Bensimhon, Bevelyn Buckles, MD  FEROSUL 325 (65 Fe) MG tablet TAKE 1 TABLET BY MOUTH TWICE DAILY WITH A MEAL 05/18/20   Laurey Morale, MD  furosemide (LASIX) 40 MG tablet Take 1 tablet (40 mg total) by mouth daily. 10/25/20   Muthersbaugh, Dahlia Client, PA-C  potassium chloride SA (KLOR-CON) 20 MEQ tablet Take 3 tablets (60 mEq total) by mouth 2 (two) times daily. 10/24/20   Allayne Butcher, PA-C  sacubitril-valsartan (ENTRESTO) 49-51 MG Take 1 tablet by mouth 2 (two) times daily. 10/12/20   Robbie Lis M, PA-C  sertraline (ZOLOFT) 25 MG tablet Take 1 tablet (25 mg total) by mouth daily. 12/22/20   Saguier, Ramon Dredge, PA-C  spironolactone (ALDACTONE) 25 MG tablet TAKE ONE TABLET BY MOUTH AT BEDTIME 10/03/20   Bensimhon, Bevelyn Buckles, MD    Allergies    Patient has no known allergies.  Review of Systems   Review of Systems 10 systems reviewed and negative except as per HPI Physical Exam Updated Vital Signs BP 128/72   Pulse 75   Temp 98.3 F (36.8 C) (Oral)   Resp 16   Ht 5\' 6"  (1.676 m)   Wt 61.2 kg   SpO2 99%   BMI 21.78 kg/m   Physical Exam Constitutional:      Appearance: Normal appearance.  HENT:     Mouth/Throat:      Pharynx: Oropharynx is clear.  Eyes:     Extraocular Movements: Extraocular movements intact.  Cardiovascular:     Rate and Rhythm: Normal rate.  Neurological:     Mental Status: He is alert.    ED Results / Procedures / Treatments   Labs (all labs ordered are listed, but only abnormal results are displayed) Labs Reviewed  RESP PANEL BY RT-PCR (FLU A&B, COVID) ARPGX2 - Abnormal; Notable for the following components:      Result Value   SARS Coronavirus 2 by RT PCR POSITIVE (*)    All other components within normal limits  COMPREHENSIVE METABOLIC PANEL - Abnormal; Notable for the following components:   Potassium 2.7 (*)    Calcium 8.2 (*)    Albumin 3.4 (*)    Total Bilirubin 1.4 (*)    All other components within normal limits  LACTIC ACID, PLASMA - Abnormal; Notable for the following components:   Lactic Acid, Venous 2.4 (*)    All other components within normal limits  LACTIC ACID, PLASMA - Abnormal; Notable for the following components:   Lactic Acid, Venous 2.5 (*)    All other components within normal limits  CBC WITH DIFFERENTIAL/PLATELET - Abnormal; Notable for the following components:   RBC 3.64 (*)    Hemoglobin 11.2 (*)    HCT 33.2 (*)    RDW 21.1 (*)    All other components within normal limits  PROTIME-INR - Abnormal; Notable for the following components:   Prothrombin Time 16.0 (*)    INR 1.3 (*)    All other components within normal limits  BRAIN NATRIURETIC PEPTIDE - Abnormal; Notable for the following components:   B Natriuretic Peptide >4,500.0 (*)    All other components within normal limits  TROPONIN I (HIGH SENSITIVITY) - Abnormal; Notable for the following components:   Troponin I (High Sensitivity) 31 (*)    All other components within normal limits  TROPONIN I (HIGH SENSITIVITY) - Abnormal; Notable for the following components:   Troponin I (High Sensitivity) 32 (*)    All other components within normal limits  MAGNESIUM  PHOSPHORUS   URINALYSIS, ROUTINE W REFLEX MICROSCOPIC    EKG EKG Interpretation  Date/Time:  Friday December 23 2020 16:51:27 EDT Ventricular Rate:  77 PR Interval:  184 QRS Duration: 196 QT Interval:  528 QTC Calculation: 597 R Axis:   84 Text Interpretation: Normal sinus rhythm Left bundle branch block Abnormal ECG old LBBB with progressed anterior voltage and elevation Confirmed by [id number] [phone]) on 12/23/2020 4:57:36 PM  Radiology DG  Chest 2 View  Result Date: 12/23/2020 CLINICAL DATA:  pedal edema, wt gain, hx of chf and wheezing. EXAM: CHEST - 2 VIEW COMPARISON:  10/24/2020 FINDINGS: Stable cardiomegaly. Prior median sternotomy. Atherosclerotic calcification of the aortic knob. Coronary artery stent is visualized. Small left pleural effusion. Mild vascular congestion without overt edema. No pneumothorax. IMPRESSION: 1. Small left pleural effusion. 2. Cardiomegaly and mild pulmonary vascular congestion. Electronically Signed   By: Duanne Guess D.O.   On: 12/23/2020 13:12   DG Chest Port 1 View  Result Date: 12/23/2020 CLINICAL DATA:  Shortness of breath EXAM: PORTABLE CHEST 1 VIEW COMPARISON:  12/23/2020, 10/24/2020 FINDINGS: Post sternotomy changes. Cardiomegaly with small left effusion. Patchy airspace disease at the left mid lung. IMPRESSION: 1. Cardiomegaly with small left effusion 2. Patchy atelectasis or minimal infiltrate in the left mid to lower lung Electronically Signed   By: Jasmine Pang M.D.   On: 12/23/2020 18:43    Procedures Procedures   Medications Ordered in ED Medications  potassium chloride 10 mEq in 100 mL IVPB (10 mEq Intravenous New Bag/Given 12/23/20 2010)  0.9 %  sodium chloride infusion ( Intravenous New Bag/Given 12/23/20 2009)  furosemide (LASIX) injection 40 mg (40 mg Intravenous Given 12/23/20 2004)  potassium chloride SA (KLOR-CON) CR tablet 40 mEq (40 mEq Oral Given 12/23/20 2004)    ED Course  I have reviewed the triage vital signs and the nursing  notes.  Pertinent labs & imaging results that were available during my care of the patient were reviewed by me and considered in my medical decision making (see chart for details).    MDM Rules/Calculators/A&P                           Patient presents as outlined.  He has had some shortness of breath and has fairly large peripheral edema.  Patient reports a peripheral edema as new for him.  He has history does include CHF.  Patient was seen by MD today and found to have significantly elevated BNP in conjunction with other symptoms suspected to have CHF exacerbation and told to come to the emergency department.  Patient's troponin remains flat.  EKG has old left bundle but appears to have some increased voltage in the early anterior leads with repolarization abnormality compared to previous.  At this time patient does have some wheezing on exam and has mild vascular congestion with quite elevated BNP.  He also test positive for COVID.  Patient does not seem particularly symptomatic with COVID.  He does not appear ill and is not endorsing significant constitutional symptoms.  At this time will opt to treat with Lasix for what appears to be primarily CHF exacerbation complicated by COVID but not hypoxic or in acute respiratory distress.  Consult: Reviewed with Dr.Zierle-Ghosh [facility] hospitalist for admission Final Clinical Impression(s) / ED Diagnoses Final diagnoses:  COVID  Acute on chronic congestive heart failure, unspecified heart failure type [facility])    Rx / DC Orders ED Discharge Orders     None        Arby Barrette, MD 12/23/20 2028    Arby Barrette, MD 12/23/20 2029    Arby Barrette, MD 12/23/20 2048

## 2020-12-23 NOTE — ED Triage Notes (Signed)
Pt was seen by his MD today. He was called at home with abnormal labs. His BNP was 4833 and his Troponin was 23. He was told to come to the ER. C.O sob and wheezing.

## 2020-12-23 NOTE — Telephone Encounter (Signed)
CRITICAL VALUE STICKER  CRITICAL VALUE:tropin 22.7  RECEIVER (on-site recipient of call):Lerry Liner  DATE & TIME NOTIFIED: 12/23/20 2:21  MESSENGER (representative from lab):Archie Patten  MD NOTIFIED:Edward Saguier  TIME OF NOTIFICATION:  RESPONSE:

## 2020-12-23 NOTE — ED Notes (Signed)
Attempted to call report to [facility] 3E. RN unavailable for report.

## 2020-12-23 NOTE — Patient Instructions (Addendum)
History of CHF, low ejection fraction, history of elevated troponins in the past, history of asthma and heavy smoker.  With recent described shortness of breath considering possible CHF flare, asthma flare or COPD exacerbation.  On review of prior labs in the past it looks like he might have had demand ischemia.  Approaching the weekend as evaluate this patient.  Will get stat BNP, CMP and stat chest x-ray.  Also decided to include stat troponin..  Included CBC as well as this visit history of anemia.  If significantly abnormal labs come back did counsel patient that we will be advising ED evaluation as we approached the weekend.  Some abnormal labs might need to be repeated.  Treatment decisions to be made after imaging and lab review.  Follow-up date to be determined after assessment of work-up and response to treatment.

## 2020-12-23 NOTE — ED Notes (Signed)
Report given to [facility]. 

## 2020-12-23 NOTE — Telephone Encounter (Signed)
Spoke with step-daughter and she will get patient to the ED at [facility] as soon as possible.  Lab results explained.   Spoke with ED downstairs to let them know patient is coming.

## 2020-12-23 NOTE — ED Notes (Signed)
Report called to Chief Operating Officer at [facility]

## 2020-12-23 NOTE — Progress Notes (Signed)
Patient accepted to tele bed at [facility] for likely CHF exacerbation. [facility] to assume care when patient arrives to accepting facility.

## 2020-12-24 ENCOUNTER — Encounter (HOSPITAL_COMMUNITY): Payer: Self-pay | Admitting: Family Medicine

## 2020-12-24 ENCOUNTER — Inpatient Hospital Stay (HOSPITAL_COMMUNITY): Payer: Medicare Other

## 2020-12-24 DIAGNOSIS — Z87891 Personal history of nicotine dependence: Secondary | ICD-10-CM | POA: Diagnosis not present

## 2020-12-24 DIAGNOSIS — I110 Hypertensive heart disease with heart failure: Secondary | ICD-10-CM | POA: Diagnosis present

## 2020-12-24 DIAGNOSIS — R0602 Shortness of breath: Secondary | ICD-10-CM | POA: Diagnosis present

## 2020-12-24 DIAGNOSIS — U071 COVID-19: Secondary | ICD-10-CM

## 2020-12-24 DIAGNOSIS — Z951 Presence of aortocoronary bypass graft: Secondary | ICD-10-CM

## 2020-12-24 DIAGNOSIS — I5082 Biventricular heart failure: Secondary | ICD-10-CM | POA: Diagnosis present

## 2020-12-24 DIAGNOSIS — E876 Hypokalemia: Secondary | ICD-10-CM | POA: Diagnosis not present

## 2020-12-24 DIAGNOSIS — I5043 Acute on chronic combined systolic (congestive) and diastolic (congestive) heart failure: Secondary | ICD-10-CM | POA: Diagnosis present

## 2020-12-24 DIAGNOSIS — Z79899 Other long term (current) drug therapy: Secondary | ICD-10-CM | POA: Diagnosis not present

## 2020-12-24 DIAGNOSIS — I2510 Atherosclerotic heart disease of native coronary artery without angina pectoris: Secondary | ICD-10-CM

## 2020-12-24 DIAGNOSIS — E785 Hyperlipidemia, unspecified: Secondary | ICD-10-CM

## 2020-12-24 DIAGNOSIS — Z9114 Patient's other noncompliance with medication regimen: Secondary | ICD-10-CM | POA: Diagnosis not present

## 2020-12-24 DIAGNOSIS — K7460 Unspecified cirrhosis of liver: Secondary | ICD-10-CM | POA: Diagnosis present

## 2020-12-24 DIAGNOSIS — Z7982 Long term (current) use of aspirin: Secondary | ICD-10-CM | POA: Diagnosis not present

## 2020-12-24 DIAGNOSIS — Z7984 Long term (current) use of oral hypoglycemic drugs: Secondary | ICD-10-CM | POA: Diagnosis not present

## 2020-12-24 DIAGNOSIS — I5084 End stage heart failure: Secondary | ICD-10-CM | POA: Diagnosis present

## 2020-12-24 DIAGNOSIS — F32A Depression, unspecified: Secondary | ICD-10-CM | POA: Diagnosis present

## 2020-12-24 DIAGNOSIS — I447 Left bundle-branch block, unspecified: Secondary | ICD-10-CM | POA: Diagnosis not present

## 2020-12-24 DIAGNOSIS — Z955 Presence of coronary angioplasty implant and graft: Secondary | ICD-10-CM | POA: Diagnosis not present

## 2020-12-24 DIAGNOSIS — I5023 Acute on chronic systolic (congestive) heart failure: Secondary | ICD-10-CM | POA: Diagnosis not present

## 2020-12-24 DIAGNOSIS — I5021 Acute systolic (congestive) heart failure: Secondary | ICD-10-CM

## 2020-12-24 DIAGNOSIS — I080 Rheumatic disorders of both mitral and aortic valves: Secondary | ICD-10-CM | POA: Diagnosis present

## 2020-12-24 DIAGNOSIS — I959 Hypotension, unspecified: Secondary | ICD-10-CM | POA: Diagnosis not present

## 2020-12-24 DIAGNOSIS — D509 Iron deficiency anemia, unspecified: Secondary | ICD-10-CM | POA: Diagnosis present

## 2020-12-24 DIAGNOSIS — I428 Other cardiomyopathies: Secondary | ICD-10-CM | POA: Diagnosis present

## 2020-12-24 LAB — ECHOCARDIOGRAM COMPLETE
Area-P 1/2: 5.97 cm2
Height: 66 in
MV M vel: 4.28 m/s
MV Peak grad: 73.3 mmHg
P 1/2 time: 379 msec
S' Lateral: 6.5 cm
Weight: 2123.2 oz

## 2020-12-24 LAB — CBC
HCT: 30.9 % — ABNORMAL LOW (ref 39.0–52.0)
Hemoglobin: 10.1 g/dL — ABNORMAL LOW (ref 13.0–17.0)
MCH: 30.3 pg (ref 26.0–34.0)
MCHC: 32.7 g/dL (ref 30.0–36.0)
MCV: 92.8 fL (ref 80.0–100.0)
Platelets: 248 10*3/uL (ref 150–400)
RBC: 3.33 MIL/uL — ABNORMAL LOW (ref 4.22–5.81)
RDW: 20.9 % — ABNORMAL HIGH (ref 11.5–15.5)
WBC: 3.5 10*3/uL — ABNORMAL LOW (ref 4.0–10.5)
nRBC: 0 % (ref 0.0–0.2)

## 2020-12-24 LAB — COMPREHENSIVE METABOLIC PANEL
ALT: 12 U/L (ref 0–44)
AST: 29 U/L (ref 15–41)
Albumin: 2.8 g/dL — ABNORMAL LOW (ref 3.5–5.0)
Alkaline Phosphatase: 69 U/L (ref 38–126)
Anion gap: 9 (ref 5–15)
BUN: 7 mg/dL — ABNORMAL LOW (ref 8–23)
CO2: 24 mmol/L (ref 22–32)
Calcium: 8.2 mg/dL — ABNORMAL LOW (ref 8.9–10.3)
Chloride: 105 mmol/L (ref 98–111)
Creatinine, Ser: 0.66 mg/dL (ref 0.61–1.24)
GFR, Estimated: >60 mL/min (ref >60)
Glucose, Bld: 80 mg/dL (ref 70–99)
Potassium: 3.7 mmol/L (ref 3.5–5.1)
Sodium: 138 mmol/L (ref 135–145)
Total Bilirubin: 1.9 mg/dL — ABNORMAL HIGH (ref 0.3–1.2)
Total Protein: 6.5 g/dL (ref 6.5–8.1)

## 2020-12-24 LAB — HIV ANTIBODY (ROUTINE TESTING W REFLEX): HIV Screen 4th Generation wRfx: NONREACTIVE

## 2020-12-24 MED ORDER — ACETAMINOPHEN 650 MG RE SUPP: 650.0000 mg | Freq: Four times a day (QID) | RECTAL | Status: DC | PRN

## 2020-12-24 MED ORDER — SERTRALINE HCL 50 MG PO TABS
25.0000 mg | ORAL_TABLET | Freq: Every day | ORAL | Status: DC
  Administered 2020-12-24 – 2020-12-27 (×4): 25 mg via ORAL
  Filled 2020-12-24 (×4): qty 1

## 2020-12-24 MED ORDER — POTASSIUM CHLORIDE CRYS ER 20 MEQ PO TBCR
20.0000 meq | EXTENDED_RELEASE_TABLET | Freq: Every day | ORAL | Status: DC
  Administered 2020-12-24: 20 meq via ORAL
  Filled 2020-12-24: qty 1

## 2020-12-24 MED ORDER — GUAIFENESIN ER 600 MG PO TB12
600.0000 mg | ORAL_TABLET | Freq: Two times a day (BID) | ORAL | Status: DC
  Administered 2020-12-24 – 2020-12-27 (×8): 600 mg via ORAL
  Filled 2020-12-24 (×8): qty 1

## 2020-12-24 MED ORDER — SACUBITRIL-VALSARTAN 49-51 MG PO TABS
1.0000 | ORAL_TABLET | Freq: Two times a day (BID) | ORAL | Status: DC
  Administered 2020-12-24 – 2020-12-25 (×4): 1 via ORAL
  Filled 2020-12-24 (×5): qty 1

## 2020-12-24 MED ORDER — ACETAMINOPHEN 325 MG PO TABS: 650.0000 mg | ORAL_TABLET | Freq: Four times a day (QID) | ORAL | Status: DC | PRN

## 2020-12-24 MED ORDER — FUROSEMIDE 10 MG/ML IJ SOLN
60.0000 mg | Freq: Two times a day (BID) | INTRAMUSCULAR | Status: DC
  Administered 2020-12-24 – 2020-12-25 (×3): 60 mg via INTRAVENOUS
  Filled 2020-12-24 (×3): qty 6

## 2020-12-24 MED ORDER — DAPAGLIFLOZIN PROPANEDIOL 10 MG PO TABS
10.0000 mg | ORAL_TABLET | Freq: Every day | ORAL | Status: DC
  Administered 2020-12-24 – 2020-12-27 (×4): 10 mg via ORAL
  Filled 2020-12-24 (×4): qty 1

## 2020-12-24 MED ORDER — CARVEDILOL 3.125 MG PO TABS
3.1250 mg | ORAL_TABLET | Freq: Two times a day (BID) | ORAL | Status: DC
  Administered 2020-12-24 – 2020-12-26 (×5): 3.125 mg via ORAL
  Filled 2020-12-24 (×5): qty 1

## 2020-12-24 MED ORDER — ASPIRIN EC 81 MG PO TBEC
81.0000 mg | DELAYED_RELEASE_TABLET | Freq: Every day | ORAL | Status: DC
  Administered 2020-12-24 – 2020-12-27 (×4): 81 mg via ORAL
  Filled 2020-12-24 (×4): qty 1

## 2020-12-24 MED ORDER — SODIUM CHLORIDE 0.9% FLUSH
3.0000 mL | Freq: Two times a day (BID) | INTRAVENOUS | Status: DC
  Administered 2020-12-24 – 2020-12-27 (×6): 3 mL via INTRAVENOUS

## 2020-12-24 MED ORDER — ENOXAPARIN SODIUM 40 MG/0.4ML IJ SOSY
40.0000 mg | PREFILLED_SYRINGE | INTRAMUSCULAR | Status: DC
  Administered 2020-12-24 – 2020-12-27 (×4): 40 mg via SUBCUTANEOUS
  Filled 2020-12-24 (×4): qty 0.4

## 2020-12-24 MED ORDER — FERROUS SULFATE 325 (65 FE) MG PO TABS
325.0000 mg | ORAL_TABLET | Freq: Two times a day (BID) | ORAL | Status: DC
  Administered 2020-12-24 – 2020-12-25 (×3): 325 mg via ORAL
  Filled 2020-12-24 (×3): qty 1

## 2020-12-24 MED ORDER — POTASSIUM CHLORIDE CRYS ER 20 MEQ PO TBCR
40.0000 meq | EXTENDED_RELEASE_TABLET | Freq: Once | ORAL | Status: AC
  Administered 2020-12-24: 40 meq via ORAL
  Filled 2020-12-24: qty 2

## 2020-12-24 MED ORDER — ATORVASTATIN CALCIUM 40 MG PO TABS
40.0000 mg | ORAL_TABLET | Freq: Every day | ORAL | Status: DC
  Administered 2020-12-24 – 2020-12-27 (×4): 40 mg via ORAL
  Filled 2020-12-24 (×4): qty 1

## 2020-12-24 MED ORDER — SPIRONOLACTONE 25 MG PO TABS
25.0000 mg | ORAL_TABLET | Freq: Every day | ORAL | Status: DC
  Administered 2020-12-24 – 2020-12-26 (×3): 25 mg via ORAL
  Filled 2020-12-24 (×3): qty 1

## 2020-12-24 NOTE — ED Notes (Signed)
Pt. Transferred via [facility]. 

## 2020-12-24 NOTE — Plan of Care (Signed)
°  Problem: Clinical Measurements: °Goal: Diagnostic test results will improve °Outcome: Progressing °  °Problem: Activity: °Goal: Risk for activity intolerance will decrease °Outcome: Progressing °  °Problem: Nutrition: °Goal: Adequate nutrition will be maintained °Outcome: Progressing °  °Problem: Elimination: °Goal: Will not experience complications related to bowel motility °Outcome: Progressing °  °

## 2020-12-24 NOTE — Progress Notes (Signed)
  Echocardiogram 2D Echocardiogram has been performed.  Roosvelt Maser F 12/24/2020, 10:22 AM

## 2020-12-24 NOTE — H&P (Signed)
History and Physical   Jeremy Powers LKG:[id number] DOB: 01-10-1956 DOA: 12/23/2020  PCP: [name], PA-C   Patient coming from: Home  Chief Complaint: Shortness of breath, edema  HPI: Jeremy Powers is a 65 y.o. male with medical history significant of CHF, CAD, hyperlipidemia, hypertension, anemia, chronic pain who presents with ongoing shortness of breath. Patient noticed that he has been having shortness of breath for the past 3 days.  Also with some increasing lower extremity edema.  He reports dyspnea on exertion especially and some fatigue. Denies significant orthopnea.  He has been taking all of his medications from his bubble packs.  He denies any change in his p.o. intake. He went for evaluation by his PCP earlier today.  Found to have elevated BNP and mildly elevated troponin.  Sent to the ED for further evaluation.  Patient denies fevers, chills, chest pain, abdominal pain, constipation, diarrhea, nausea, vomiting.  ED Course: Vital signs in the ED are stable.  Lab work-up shows CMP with potassium of 2.7, calcium 8.2 which corrects considering albumin 3.4, T bili 1.4.  CBC shows hemoglobin stable 11.2.  PT/INR elevated at 16 and 1.3 respectively.  BNP greater than 4500.  Troponin 31 and then stable at 32 on repeat.  Lactic acid mildly elevated at 2.4 and again at 2.5 on repeat.  Respiratory panel for flu and COVID positive for COVID.  Magnesium level and phosphorus level are normal.  Chest x-ray showed cardiomegaly, small left pleural effusion, vascular congestion, questionable patchy opacities.  Patient received IV and p.o. potassium as well as a dose of IV Lasix in the ED.  Review of Systems: As per HPI otherwise all other systems reviewed and are negative.  Past Medical History:  Diagnosis Date   CHF (congestive heart failure) (HCC)    Cirrhosis (HCC)    Coronary artery disease    Hypertension    Pancreatitis     Past Surgical History:  Procedure Laterality Date    CARDIAC SURGERY     RIGHT/LEFT HEART CATH AND CORONARY/GRAFT ANGIOGRAPHY N/A 06/24/2018   Procedure: RIGHT/LEFT HEART CATH AND CORONARY/GRAFT ANGIOGRAPHY;  Surgeon: Dolores Patty, MD;  Location: [facility];  Service: Cardiovascular;  Laterality: N/A;    Social History  reports that he has quit smoking. His smoking use included cigarettes. He smoked an average of .5 packs per day. He has never used smokeless tobacco. He reports current alcohol use. He reports current drug use. Drug: Marijuana.  No Known Allergies  Family History  Problem Relation Age of Onset   Iron deficiency Neg Hx   Reviewed on admission   Prior to Admission medications   Medication Sig Start Date End Date Taking? Authorizing Provider  aspirin EC 81 MG tablet Take 81 mg by mouth daily.    [provider]  atorvastatin (LIPITOR) 40 MG tablet TAKE ONE TABLET BY MOUTH EVERYDAY TABLET 6PM FOR CHOLESTORL 11/15/20   Bensimhon, Bevelyn Buckles, MD  carvedilol (COREG) 3.125 MG tablet Take 1 tablet (3.125 mg total) by mouth 2 (two) times daily with a meal. 06/27/20   Simmons, Brittainy M, PA-C  FARXIGA 10 MG TABS tablet TAKE ONE TABLET BY MOUTH DAILY IN THE MORNING BEFORE BREAKFAST 11/15/20   Bensimhon, Bevelyn Buckles, MD  FEROSUL 325 (65 Fe) MG tablet TAKE 1 TABLET BY MOUTH TWICE DAILY WITH A MEAL 05/18/20   Laurey Morale, MD  furosemide (LASIX) 40 MG tablet Take 1 tablet (40 mg total) by mouth daily. 10/25/20  Muthersbaugh, Dahlia Client, PA-C  potassium chloride SA (KLOR-CON) 20 MEQ tablet Take 3 tablets (60 mEq total) by mouth 2 (two) times daily. 10/24/20   Robbie Lis M, PA-C  sacubitril-valsartan (ENTRESTO) 49-51 MG Take 1 tablet by mouth 2 (two) times daily. 10/12/20   Robbie Lis M, PA-C  sertraline (ZOLOFT) 25 MG tablet Take 1 tablet (25 mg total) by mouth daily. 12/22/20   Saguier, Ramon Dredge, PA-C  spironolactone (ALDACTONE) 25 MG tablet TAKE ONE TABLET BY MOUTH AT BEDTIME 10/03/20   Bensimhon, Bevelyn Buckles, MD     Physical Exam: Vitals:   12/23/20 2130 12/23/20 2330 12/24/20 0036 12/24/20 0040  BP: 130/75 119/74  122/74  Pulse: 79 81  84  Resp: 17 16  18   Temp:    97.9 F (36.6 C)  TempSrc:    Oral  SpO2: 95% 99%  97%  Weight:   60.2 kg   Height:   5\' 6"  (1.676 m)    Physical Exam Constitutional:      General: He is not in acute distress.    Appearance: Normal appearance.  HENT:     Head: Normocephalic and atraumatic.     Mouth/Throat:     Mouth: Mucous membranes are moist.     Pharynx: Oropharynx is clear.  Eyes:     Extraocular Movements: Extraocular movements intact.     Pupils: Pupils are equal, round, and reactive to light.  Cardiovascular:     Rate and Rhythm: Normal rate and regular rhythm.     Pulses: Normal pulses.     Heart sounds: Normal heart sounds.  Pulmonary:     Effort: Pulmonary effort is normal. No respiratory distress.     Breath sounds: Examination of the right-middle field reveals rhonchi. Rhonchi and rales present.  Abdominal:     General: Bowel sounds are normal. There is no distension.     Palpations: Abdomen is soft.     Tenderness: There is no abdominal tenderness.  Musculoskeletal:        General: No swelling or deformity.     Right lower leg: Edema present.     Left lower leg: Edema present.  Skin:    General: Skin is warm and dry.  Neurological:     General: No focal deficit present.     Mental Status: Mental status is at baseline.   Labs on Admission: I have personally reviewed following labs and imaging studies  CBC: Recent Labs  Lab 12/23/20 1217 12/23/20 1753  WBC 4.7 5.1  NEUTROABS 3.2 3.4  HGB 10.8* 11.2*  HCT 33.2* 33.2*  MCV 94.1 91.2  PLT 238.0 263    Basic Metabolic Panel: Recent Labs  Lab 12/23/20 1217 12/23/20 1753  NA 140 137  K 3.1* 2.7*  CL 103 102  CO2 26 24  GLUCOSE 85 81  BUN 9 9  CREATININE 0.68 0.66  CALCIUM 8.4 8.2*  MG  --  1.8  PHOS  --  2.6    GFR: Estimated Creatinine Clearance: 77.3  mL/min (by C-G formula based on SCr of 0.66 mg/dL).  Liver Function Tests: Recent Labs  Lab 12/23/20 1217 12/23/20 1753  AST 25 29  ALT 10 12  ALKPHOS 81 77  BILITOT 1.4* 1.4*  PROT 7.2 7.8  ALBUMIN 3.3* 3.4*    Urine analysis: No results found for: COLORURINE, APPEARANCEUR, LABSPEC, PHURINE, GLUCOSEU, HGBUR, BILIRUBINUR, KETONESUR, PROTEINUR, UROBILINOGEN, NITRITE, LEUKOCYTESUR  Radiological Exams on Admission: DG Chest 2 View  Result Date: 12/23/2020 CLINICAL DATA:  pedal edema, wt gain, hx of chf and wheezing. EXAM: CHEST - 2 VIEW COMPARISON:  10/24/2020 FINDINGS: Stable cardiomegaly. Prior median sternotomy. Atherosclerotic calcification of the aortic knob. Coronary artery stent is visualized. Small left pleural effusion. Mild vascular congestion without overt edema. No pneumothorax. IMPRESSION: 1. Small left pleural effusion. 2. Cardiomegaly and mild pulmonary vascular congestion. Electronically Signed   By: Duanne Guess D.O.   On: 12/23/2020 13:12   DG Chest Port 1 View  Result Date: 12/23/2020 CLINICAL DATA:  Shortness of breath EXAM: PORTABLE CHEST 1 VIEW COMPARISON:  12/23/2020, 10/24/2020 FINDINGS: Post sternotomy changes. Cardiomegaly with small left effusion. Patchy airspace disease at the left mid lung. IMPRESSION: 1. Cardiomegaly with small left effusion 2. Patchy atelectasis or minimal infiltrate in the left mid to lower lung Electronically Signed   By: Jasmine Pang M.D.   On: 12/23/2020 18:43    EKG: Independently reviewed.  Normal sinus rhythm at 77 bpm.  Left bundle branch block.  Repolarization abnormality.  Similar to previous.  Assessment/Plan Principal Problem:   CHF exacerbation (HCC) Active Problems:   Aortic insufficiency   CAD (coronary artery disease)   Hyperlipidemia   Presence of aortocoronary bypass graft   COVID-19 virus infection  CHF exacerbation > Last echo in February of this year with EF 20-25%, G2 DD, normal RV function. > Follows with  heart failure clinic. > Shortness of breath for the past 3 days.  BNP greater than 4500.  Chest x-ray with cardiomegaly, small effusion, vascular congestion and questionable opacities. > Troponin mildly elevated to 31 but stable at 32 on repeat.  Lactic acid also mildly elevated at 2.4 and then 2.5 on repeat.  Could be a degree of decreased perfusion. - Monitor on telemetry - Continue Lasix at 60 mg IV twice daily - Continue home Clifton Custard, spironolactone, Coreg - Strict I's/O, daily weights -Trend renal function electrolytes  Hypokalemia > Potassium noted to be 2.7 in the ED.  Received 30 mEq IV and 40 mEq p.o. > Magnesium and Phos normal in the ED. - We will add additional 40 mEq p.o. given his goal is closer to 4 and is receiving diuresis. - Trend electrolytes  COVID-19 infection > Does not appear to have significant symptoms from this at this time.  Suspect his shortness of breath is due to his CHF. > If he develops worsening symptoms could consider reimaging chest to evaluate for worsening opacities versus improving edema. - Mucinex for cough  CAD > History of multiple PCI's and subsequent CABG in 2017. > Troponin flat at 31 and then 32 on repeat as above. -Continue home aspirin, atorvastatin, Entresto, Coreg  Iron deficiency anemia - Continue home iron  Depression  - Continue on sertraline  Illiterate > Per chart review - Noted  DVT prophylaxis: Lovenox  Code Status:   Full  Family Communication:  None on admission. States his girlfriend was with him at the med-center and knows the plan. Disposition Plan:   Patient is from:  Home  Anticipated DC to:  Home  Anticipated DC date:  2 to 4 days  Anticipated DC barriers: None  Consults called:  None  Admission status:    Inpatient, telemetry  Severity of Illness: The appropriate patient status for this patient is INPATIENT. Inpatient status is judged to be reasonable and necessary in order to provide the  required intensity of service to ensure the patient's safety. The patient's presenting symptoms, physical exam findings, and initial radiographic and laboratory data in  the context of their chronic comorbidities is felt to place them at high risk for further clinical deterioration. Furthermore, it is not anticipated that the patient will be medically stable for discharge from the hospital within 2 midnights of admission. The following factors support the patient status of inpatient.   " The patient's presenting symptoms include shortness of breath, edema. " The worrisome physical exam findings include edema, rales, minimal rhonchi. " The initial radiographic and laboratory data are worrisome because of Lab work-up shows CMP with potassium of 2.7, calcium 8.2 which corrects considering albumin 3.4, T bili 1.4.  CBC shows hemoglobin stable 11.2.  PT/INR elevated at 16 and 1.3 respectively.  BNP greater than 4500.  Troponin 31 and then stable at 32 on repeat.  Lactic acid mildly elevated at 2.4 and again at 2.5 on repeat.  Respiratory panel for flu and COVID positive for COVID.  Magnesium level and phosphorus level are normal.  Chest x-ray showed cardiomegaly, small left pleural effusion, vascular congestion, questionable patchy opacities. . " The chronic co-morbidities include CHF, CAD, hyperlipidemia, hypertension, anemia.   * I certify that at the point of admission it is my clinical judgment that the patient will require inpatient hospital care spanning beyond 2 midnights from the point of admission due to high intensity of service, high risk for further deterioration and high frequency of surveillance required.Synetta Fail MD [facility]  How to contact the [facility] Attending or Consulting provider 7A - 7P or covering provider during after hours 7P -7A, for this patient?   Check the care team in [facility] and look for a) attending/consulting [facility] provider listed and b) the [facility] team  listed Log into [email or web address] and use [place]'s universal password to access. If you do not have the password, please contact the hospital operator. Locate the [facility] provider you are looking for under [facility] and page to a number that you can be directly reached. If you still have difficulty reaching the provider, please page the [facility] (Director on Call) for the Hospitalists listed on amion for assistance.  12/24/2020, 1:33 AM

## 2020-12-24 NOTE — Progress Notes (Addendum)
Progress Note    Jeremy Powers  IFO:[id number] DOB: 04-Oct-1955  DOA: 12/23/2020 PCP: [name], PA-C      Brief Narrative:    Medical records reviewed and are as summarized below:  Jeremy Powers is a 65 y.o. 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  He was admitted to the hospital for acute exacerbation of chronic systolic and diastolic CHF.  He was treated with IV Lasix.  He also had hypokalemia that was repleted.  Incidentally, he tested positive for COVID-19 infection.    Assessment/Plan:   Principal Problem:   CHF exacerbation (HCC) Active Problems:   Aortic insufficiency   CAD (coronary artery disease)   Hyperlipidemia   Presence of aortocoronary bypass graft   COVID-19 virus infection   Body mass index is 21.42 kg/m.   Acute on chronic systolic and diastolic CHF: BNP > 4,500.  Continue IV Lasix.  Continue dapagliflozin, Entresto and carvedilol.  Monitor BMP, daily weight and urine output.  2D echo in February 2020 showed EF estimated at 20 to 25%, grade 2 diastolic dysfunction.  Repeat 2D echo is pending.  Hypokalemia: Improving.  Continue potassium repletion because of ongoing diuresis.  CAD with previous PCI and CABG: continue aspirin and Lipitor  COVID-19 infection: Asymptomatic.  He is on airborne and contact isolation.  Other comorbidities include iron deficiency anemia, depression, hyperlipidemia, hypertension    Diet Order             Diet Heart Room service appropriate? Yes; Fluid consistency: Thin; Fluid restriction: 1800 mL Fluid  Diet effective now                      Consultants: None  Procedures: None    Medications:    aspirin EC  81 mg Oral Daily   atorvastatin  40  mg Oral Daily   carvedilol  3.125 mg Oral BID WC   dapagliflozin propanediol  10 mg Oral Daily   enoxaparin (LOVENOX) injection  40 mg Subcutaneous Q24H   ferrous sulfate  325 mg Oral BID WC   furosemide  60 mg Intravenous Q12H   guaiFENesin  600 mg Oral BID   potassium chloride  20 mEq Oral Daily   sacubitril-valsartan  1 tablet Oral BID   sertraline  25 mg Oral Daily   sodium chloride flush  3 mL Intravenous Q12H   spironolactone  25 mg Oral QHS   Continuous Infusions:   Anti-infectives (From admission, onward)    None              Family Communication/Anticipated D/C date and plan/Code Status   DVT prophylaxis: enoxaparin (LOVENOX) injection 40 mg Start: 12/24/20 0600     Code Status: Full Code  Family Communication: None Disposition Plan:    Status is: Inpatient  Remains inpatient appropriate because:IV treatments appropriate due to intensity of illness or inability to take PO and Inpatient level of care appropriate due to severity of illness  Dispo: The patient is from: Home              Anticipated d/c is to: Home              Patient currently is not medically stable to d/c.   Difficult to  place patient No           Subjective:   C/o shortness of breath and leg swelling but he feels a little better today.  Objective:    Vitals:   12/24/20 0036 12/24/20 0040 12/24/20 0747 12/24/20 0804  BP:  122/74 (!) 111/92 132/77  Pulse:  84 84 89  Resp:  18 19   Temp:  97.9 F (36.6 C) 98.1 F (36.7 C)   TempSrc:  Oral Oral   SpO2:  97% 96%   Weight: 60.2 kg     Height: 5\' 6"  (1.676 m)      No data found.   Intake/Output Summary (Last 24 hours) at 12/24/2020 1033 Last data filed at 12/24/2020 0913 Gross per 24 hour  Intake 1303.31 ml  Output 2025 ml  Net -721.69 ml   Filed Weights   12/23/20 1646 12/24/20 0036  Weight: 61.2 kg 60.2 kg    Exam:  GEN: NAD SKIN: No rash EYES: EOMI ENT: MMM CV: RRR PULM: Bibasilar rales and  bilateral wheezing ABD: soft, ND, NT, +BS CNS: AAO x 3, non focal EXT: B/l leg and pedal edema, no erythema or tenderness        Data Reviewed:   I have personally reviewed following labs and imaging studies:  Labs: Labs show the following:   Basic Metabolic Panel: Recent Labs  Lab 12/23/20 1217 12/23/20 1753 12/24/20 0408  NA 140 137 138  K 3.1* 2.7* 3.7  CL 103 102 105  CO2 26 24 24   GLUCOSE 85 81 80  BUN 9 9 7*  CREATININE 0.68 0.66 0.66  CALCIUM 8.4 8.2* 8.2*  MG  --  1.8  --   PHOS  --  2.6  --    GFR Estimated Creatinine Clearance: 77.3 mL/min (by C-G formula based on SCr of 0.66 mg/dL). Liver Function Tests: Recent Labs  Lab 12/23/20 1217 12/23/20 1753 12/24/20 0408  AST 25 29 29   ALT 10 12 12   ALKPHOS 81 77 69  BILITOT 1.4* 1.4* 1.9*  PROT 7.2 7.8 6.5  ALBUMIN 3.3* 3.4* 2.8*   No results for input(s): LIPASE, AMYLASE in the last 168 hours. No results for input(s): AMMONIA in the last 168 hours. Coagulation profile Recent Labs  Lab 12/23/20 1753  INR 1.3*    CBC: Recent Labs  Lab 12/23/20 1217 12/23/20 1753 12/24/20 0408  WBC 4.7 5.1 3.5*  NEUTROABS 3.2 3.4  --   HGB 10.8* 11.2* 10.1*  HCT 33.2* 33.2* 30.9*  MCV 94.1 91.2 92.8  PLT 238.0 263 248   Cardiac Enzymes: No results for input(s): CKTOTAL, CKMB, CKMBINDEX, TROPONINI in the last 168 hours. BNP (last 3 results) Recent Labs    04/28/20 1111 12/23/20 1217  PROBNP 717.0* >4833.0*   CBG: No results for input(s): GLUCAP in the last 168 hours. D-Dimer: No results for input(s): DDIMER in the last 72 hours. Hgb A1c: No results for input(s): HGBA1C in the last 72 hours. Lipid Profile: No results for input(s): CHOL, HDL, LDLCALC, TRIG, CHOLHDL, LDLDIRECT in the last 72 hours. Thyroid function studies: No results for input(s): TSH, T4TOTAL, T3FREE, THYROIDAB in the last 72 hours.  Invalid input(s): FREET3 Anemia work up: No results for input(s): VITAMINB12, FOLATE,  FERRITIN, TIBC, IRON, RETICCTPCT in the last 72 hours. Sepsis Labs: Recent Labs  Lab 12/23/20 1217 12/23/20 1753 12/23/20 1815 12/24/20 0408  WBC 4.7 5.1  --  3.5*  LATICACIDVEN  --   --  2.5*  2.4*  --  Microbiology Recent Results (from the past 240 hour(s))  Resp Panel by RT-PCR (Flu A&B, Covid) Nasopharyngeal Swab     Status: Abnormal   Collection Time: 12/23/20  6:15 PM   Specimen: Nasopharyngeal Swab; Nasopharyngeal(NP) swabs in vial transport medium  Result Value Ref Range Status   SARS Coronavirus 2 by RT PCR POSITIVE (A) NEGATIVE Final    Comment: RESULT CALLED TO, READ BACK BY AND VERIFIED WITH:  COBLE,S RN @ 1902 12/23/20 EDENSCA (NOTE) SARS-CoV-2 target nucleic acids are DETECTED.  The SARS-CoV-2 RNA is generally detectable in upper respiratory specimens during the acute phase of infection. Positive results are indicative of the presence of the identified virus, but do not rule out bacterial infection or co-infection with other pathogens not detected by the test. Clinical correlation with patient history and other diagnostic information is necessary to determine patient infection status. The expected result is Negative.  Fact Sheet for Patients: BloggerCourse.com  Fact Sheet for Healthcare Providers: SeriousBroker.it  This test is not yet approved or cleared by the [place] FDA and  has been authorized for detection and/or diagnosis of SARS-CoV-2 by FDA under an Emergency Use Authorization (EUA).  This EUA will remain in effect (meaning this test can be  used) for the duration of  the COVID-19 declaration under Section 564(b)(1) of the Act, 21 U.S.C. section 360bbb-3(b)(1), unless the authorization is terminated or revoked sooner.     Influenza A by PCR NEGATIVE NEGATIVE Final   Influenza B by PCR NEGATIVE NEGATIVE Final    Comment: (NOTE) The Xpert Xpress SARS-CoV-2/FLU/RSV plus assay is intended as  an aid in the diagnosis of influenza from Nasopharyngeal swab specimens and should not be used as a sole basis for treatment. Nasal washings and aspirates are unacceptable for Xpert Xpress SARS-CoV-2/FLU/RSV testing.  Fact Sheet for Patients: BloggerCourse.com  Fact Sheet for Healthcare Providers: SeriousBroker.it  This test is not yet approved or cleared by the [place] FDA and has been authorized for detection and/or diagnosis of SARS-CoV-2 by FDA under an Emergency Use Authorization (EUA). This EUA will remain in effect (meaning this test can be used) for the duration of the COVID-19 declaration under Section 564(b)(1) of the Act, 21 U.S.C. section 360bbb-3(b)(1), unless the authorization is terminated or revoked.  Performed at [facility], [address] [place], [place] [address]     Procedures and diagnostic studies:  DG Chest 2 View  Result Date: 12/23/2020 CLINICAL DATA:  pedal edema, wt gain, hx of chf and wheezing. EXAM: CHEST - 2 VIEW COMPARISON:  10/24/2020 FINDINGS: Stable cardiomegaly. Prior median sternotomy. Atherosclerotic calcification of the aortic knob. Coronary artery stent is visualized. Small left pleural effusion. Mild vascular congestion without overt edema. No pneumothorax. IMPRESSION: 1. Small left pleural effusion. 2. Cardiomegaly and mild pulmonary vascular congestion. Electronically Signed   By: Duanne Guess D.O.   On: 12/23/2020 13:12   DG Chest Port 1 View  Result Date: 12/23/2020 CLINICAL DATA:  Shortness of breath EXAM: PORTABLE CHEST 1 VIEW COMPARISON:  12/23/2020, 10/24/2020 FINDINGS: Post sternotomy changes. Cardiomegaly with small left effusion. Patchy airspace disease at the left mid lung. IMPRESSION: 1. Cardiomegaly with small left effusion 2. Patchy atelectasis or minimal infiltrate in the left mid to lower lung Electronically Signed   By: Jasmine Pang M.D.   On: 12/23/2020  18:43               LOS: 0 days   Jeremy Powers  [facility]  Pager on [email or web address]. If 7PM-7AM, please contact night-coverage at [email or web address]     12/24/2020, 10:33 AM

## 2020-12-25 LAB — BASIC METABOLIC PANEL
Anion gap: 8 (ref 5–15)
BUN: 7 mg/dL — ABNORMAL LOW (ref 8–23)
CO2: 28 mmol/L (ref 22–32)
Calcium: 8.2 mg/dL — ABNORMAL LOW (ref 8.9–10.3)
Chloride: 101 mmol/L (ref 98–111)
Creatinine, Ser: 0.8 mg/dL (ref 0.61–1.24)
GFR, Estimated: >60 mL/min (ref >60)
Glucose, Bld: 90 mg/dL (ref 70–99)
Potassium: 3.2 mmol/L — ABNORMAL LOW (ref 3.5–5.1)
Sodium: 137 mmol/L (ref 135–145)

## 2020-12-25 LAB — MAGNESIUM: Magnesium: 1.3 mg/dL — ABNORMAL LOW (ref 1.7–2.4)

## 2020-12-25 MED ORDER — FERROUS SULFATE 325 (65 FE) MG PO TABS
325.0000 mg | ORAL_TABLET | Freq: Every day | ORAL | Status: DC
  Administered 2020-12-26 – 2020-12-27 (×2): 325 mg via ORAL
  Filled 2020-12-25 (×2): qty 1

## 2020-12-25 MED ORDER — MAGNESIUM SULFATE 4 GM/100ML IV SOLN
4.0000 g | Freq: Once | INTRAVENOUS | Status: AC
  Administered 2020-12-25: 4 g via INTRAVENOUS
  Filled 2020-12-25: qty 100

## 2020-12-25 MED ORDER — POTASSIUM CHLORIDE CRYS ER 20 MEQ PO TBCR
40.0000 meq | EXTENDED_RELEASE_TABLET | ORAL | Status: AC
Start: 2020-12-25 — End: 2020-12-25
  Administered 2020-12-25 (×2): 40 meq via ORAL
  Filled 2020-12-25 (×2): qty 2

## 2020-12-25 MED ORDER — FUROSEMIDE 10 MG/ML IJ SOLN
40.0000 mg | Freq: Two times a day (BID) | INTRAMUSCULAR | Status: DC
  Administered 2020-12-26: 40 mg via INTRAVENOUS
  Filled 2020-12-25 (×3): qty 4

## 2020-12-25 NOTE — Progress Notes (Signed)
Progress Note    Jeremy Powers  OXB:[id number] DOB: 11/10/1955  DOA: 12/23/2020 PCP: [name], PA-C      Brief Narrative:    Medical records reviewed and are as summarized below:  Jeremy Powers is a 65 y.o. 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  He was admitted to the hospital for acute exacerbation of chronic systolic and diastolic CHF.  He was treated with IV Lasix.  He also had hypokalemia that was repleted.  Incidentally, he tested positive for COVID-19 infection.    Assessment/Plan:   Principal Problem:   CHF exacerbation (HCC) Active Problems:   Aortic insufficiency   CAD (coronary artery disease)   Hyperlipidemia   Presence of aortocoronary bypass graft   COVID-19 virus infection   Body mass index is 20.3 kg/m.   Acute on chronic systolic and diastolic CHF: BNP > 4,500.  Decrease IV Lasix from 60 to 40 mg twice daily.  Continue dapagliflozin, Entresto and carvedilol.  Monitor BMP, daily weight and urine output.  2D echo in February 2020 showed EF estimated at 20 to 25%, grade 2 diastolic dysfunction.  Repeat 2D echo showed EF estimated at 10 to 15%, grade 2 diastolic dysfunction,, moderately dilated left and right atria, moderate to severe MR, moderate TR, moderate to severe AR.  Hypokalemia and hypomagnesemia: Replete potassium and magnesium and monitor levels.  CAD with previous PCI and CABG: continue aspirin and Lipitor  COVID-19 infection: Asymptomatic.  He is on airborne and contact isolation.  Other comorbidities include iron deficiency anemia, depression, hyperlipidemia, hypertension    Diet Order             Diet Heart Room service appropriate? Yes; Fluid consistency: Thin; Fluid restriction:  1800 mL Fluid  Diet effective now                      Consultants: None  Procedures: None    Medications:    aspirin EC  81 mg Oral Daily   atorvastatin  40 mg Oral Daily   carvedilol  3.125 mg Oral BID WC   dapagliflozin propanediol  10 mg Oral Daily   enoxaparin (LOVENOX) injection  40 mg Subcutaneous Q24H   [START ON 12/26/2020] ferrous sulfate  325 mg Oral Q breakfast   furosemide  40 mg Intravenous Q12H   guaiFENesin  600 mg Oral BID   potassium chloride  40 mEq Oral Q4H   sacubitril-valsartan  1 tablet Oral BID   sertraline  25 mg Oral Daily   sodium chloride flush  3 mL Intravenous Q12H   spironolactone  25 mg Oral QHS   Continuous Infusions:   Anti-infectives (From admission, onward)    None              Family Communication/Anticipated D/C date and plan/Code Status   DVT prophylaxis: enoxaparin (LOVENOX) injection 40 mg Start: 12/24/20 0600     Code Status: Full Code  Family Communication: None Disposition Plan:    Status is: Inpatient  Remains inpatient appropriate because:IV treatments appropriate due to intensity of illness or inability to take PO and Inpatient level of care appropriate due to severity of illness  Dispo: The patient is from: Home  Anticipated d/c is to: Home              Patient currently is not medically stable to d/c.   Difficult to place patient No           Subjective:   Interval events noted.  He feels a little better today.  No shortness of breath or chest pain.  Objective:    Vitals:   12/25/20 0055 12/25/20 0509 12/25/20 0732 12/25/20 1000  BP: 119/74 113/68 119/63 107/66  Pulse:   74 66  Resp: 19 10 12 11   Temp: 97.7 F (36.5 C) 97.8 F (36.6 C) 98.3 F (36.8 C) 98.3 F (36.8 C)  TempSrc: Oral Oral Oral Oral  SpO2:   95%   Weight:      Height:       No data found.   Intake/Output Summary (Last 24 hours) at 12/25/2020 1327 Last data filed at 12/25/2020 1214 Gross  per 24 hour  Intake 1003 ml  Output 4000 ml  Net -2997 ml   Filed Weights   12/23/20 1646 12/24/20 0036 12/25/20 0052  Weight: 61.2 kg 60.2 kg 57.1 kg    Exam:  GEN: NAD SKIN: Warm and dry EYES: EOMI ENT: MMM CV: RRR PULM: Bibasilar rales.  No wheezing heard. ABD: soft, ND, NT, +BS CNS: AAO x 3, non focal EXT: Bilateral ankle and pedal edema.  No erythema or tenderness.        Data Reviewed:   I have personally reviewed following labs and imaging studies:  Labs: Labs show the following:   Basic Metabolic Panel: Recent Labs  Lab 12/23/20 1217 12/23/20 1753 12/24/20 0408 12/25/20 0326  NA 140 137 138 137  K 3.1* 2.7* 3.7 3.2*  CL 103 102 105 101  CO2 26 24 24 28   GLUCOSE 85 81 80 90  BUN 9 9 7* 7*  CREATININE 0.68 0.66 0.66 0.80  CALCIUM 8.4 8.2* 8.2* 8.2*  MG  --  1.8  --  1.3*  PHOS  --  2.6  --   --    GFR Estimated Creatinine Clearance: 73.4 mL/min (by C-G formula based on SCr of 0.8 mg/dL). Liver Function Tests: Recent Labs  Lab 12/23/20 1217 12/23/20 1753 12/24/20 0408  AST 25 29 29   ALT 10 12 12   ALKPHOS 81 77 69  BILITOT 1.4* 1.4* 1.9*  PROT 7.2 7.8 6.5  ALBUMIN 3.3* 3.4* 2.8*   No results for input(s): LIPASE, AMYLASE in the last 168 hours. No results for input(s): AMMONIA in the last 168 hours. Coagulation profile Recent Labs  Lab 12/23/20 1753  INR 1.3*    CBC: Recent Labs  Lab 12/23/20 1217 12/23/20 1753 12/24/20 0408  WBC 4.7 5.1 3.5*  NEUTROABS 3.2 3.4  --   HGB 10.8* 11.2* 10.1*  HCT 33.2* 33.2* 30.9*  MCV 94.1 91.2 92.8  PLT 238.0 263 248   Cardiac Enzymes: No results for input(s): CKTOTAL, CKMB, CKMBINDEX, TROPONINI in the last 168 hours. BNP (last 3 results) Recent Labs    04/28/20 1111 12/23/20 1217  PROBNP 717.0* >4833.0*   CBG: No results for input(s): GLUCAP in the last 168 hours. D-Dimer: No results for input(s): DDIMER in the last 72 hours. Hgb A1c: No results for input(s): HGBA1C in the last  72 hours. Lipid Profile: No results for input(s): CHOL, HDL, LDLCALC, TRIG, CHOLHDL, LDLDIRECT in the last 72 hours. Thyroid function studies: No results for input(s): TSH, T4TOTAL, T3FREE, THYROIDAB in the last 72  hours.  Invalid input(s): FREET3 Anemia work up: No results for input(s): VITAMINB12, FOLATE, FERRITIN, TIBC, IRON, RETICCTPCT in the last 72 hours. Sepsis Labs: Recent Labs  Lab 12/23/20 1217 12/23/20 1753 12/23/20 1815 12/24/20 0408  WBC 4.7 5.1  --  3.5*  LATICACIDVEN  --   --  2.5*  2.4*  --     Microbiology Recent Results (from the past 240 hour(s))  Resp Panel by RT-PCR (Flu A&B, Covid) Nasopharyngeal Swab     Status: Abnormal   Collection Time: 12/23/20  6:15 PM   Specimen: Nasopharyngeal Swab; Nasopharyngeal(NP) swabs in vial transport medium  Result Value Ref Range Status   SARS Coronavirus 2 by RT PCR POSITIVE (A) NEGATIVE Final    Comment: RESULT CALLED TO, READ BACK BY AND VERIFIED WITH:  COBLE,S RN @ 1902 12/23/20 EDENSCA (NOTE) SARS-CoV-2 target nucleic acids are DETECTED.  The SARS-CoV-2 RNA is generally detectable in upper respiratory specimens during the acute phase of infection. Positive results are indicative of the presence of the identified virus, but do not rule out bacterial infection or co-infection with other pathogens not detected by the test. Clinical correlation with patient history and other diagnostic information is necessary to determine patient infection status. The expected result is Negative.  Fact Sheet for Patients: BloggerCourse.com  Fact Sheet for Healthcare Providers: SeriousBroker.it  This test is not yet approved or cleared by the [place] FDA and  has been authorized for detection and/or diagnosis of SARS-CoV-2 by FDA under an Emergency Use Authorization (EUA).  This EUA will remain in effect (meaning this test can be  used) for the duration of  the COVID-19  declaration under Section 564(b)(1) of the Act, 21 U.S.C. section 360bbb-3(b)(1), unless the authorization is terminated or revoked sooner.     Influenza A by PCR NEGATIVE NEGATIVE Final   Influenza B by PCR NEGATIVE NEGATIVE Final    Comment: (NOTE) The Xpert Xpress SARS-CoV-2/FLU/RSV plus assay is intended as an aid in the diagnosis of influenza from Nasopharyngeal swab specimens and should not be used as a sole basis for treatment. Nasal washings and aspirates are unacceptable for Xpert Xpress SARS-CoV-2/FLU/RSV testing.  Fact Sheet for Patients: BloggerCourse.com  Fact Sheet for Healthcare Providers: SeriousBroker.it  This test is not yet approved or cleared by the [place] FDA and has been authorized for detection and/or diagnosis of SARS-CoV-2 by FDA under an Emergency Use Authorization (EUA). This EUA will remain in effect (meaning this test can be used) for the duration of the COVID-19 declaration under Section 564(b)(1) of the Act, 21 U.S.C. section 360bbb-3(b)(1), unless the authorization is terminated or revoked.  Performed at [facility], [address] [place], [place] [address]     Procedures and diagnostic studies:  DG Chest [facility] 1 View  Result Date: 12/23/2020 CLINICAL DATA:  Shortness of breath EXAM: PORTABLE CHEST 1 VIEW COMPARISON:  12/23/2020, 10/24/2020 FINDINGS: Post sternotomy changes. Cardiomegaly with small left effusion. Patchy airspace disease at the left mid lung. IMPRESSION: 1. Cardiomegaly with small left effusion 2. Patchy atelectasis or minimal infiltrate in the left mid to lower lung Electronically Signed   By: Jasmine Pang M.D.   On: 12/23/2020 18:43   ECHOCARDIOGRAM COMPLETE  Result Date: 12/24/2020    ECHOCARDIOGRAM REPORT   Patient Name:   Jeremy Powers Date of Exam: 12/24/2020 Medical Rec #:  [id number]     Height:       66.0 in Accession #:    [id number]  Weight:        132.7 lb Date of Birth:  1956-04-09     BSA:          1.680 m Patient Age:    44 years      BP:           114/67 mmHg Patient Gender: M             HR:           78 bpm. Exam Location:  Inpatient Procedure: 2D Echo, Cardiac Doppler and Color Doppler Indications:    CHF Acute Systolic I50.21  History:        Patient has prior history of Echocardiogram examinations, most                 recent 07/15/2020. CHF; Prior CABG. Covid 19 positive.  Sonographer:    Roosvelt Maser RDCS Referring Phys: [id number] Cecille Po MELVIN IMPRESSIONS  1. Compared to prior study from 07/15/20, RVEF is depressed; LVEF is probably worse and Valvular dysfunction is worse.  2. Severe global hypokineiss; septal akinesis.. Left ventricular ejection fraction, by estimation, is 10-15%. The left ventricle has severely decreased function. The left ventricular internal cavity size was severely dilated. Left ventricular diastolic parameters are consistent with Grade II diastolic dysfunction (pseudonormalization). Elevated left atrial pressure.  3. Right ventricular systolic function moderate to severely reduced. The right ventricular size is normal.  4. Left atrial size was moderately dilated.  5. Right atrial size was moderately dilated.  6. The mitral valve is normal in structure. Moderate to severe mitral valve regurgitation.  7. Tricuspid valve regurgitation is moderate.  8. The aortic valve is tricuspid. Aortic valve regurgitation is moderate to severe. Mild aortic valve sclerosis is present, with no evidence of aortic valve stenosis.  9. The inferior vena cava is dilated in size with <50% respiratory variability, suggesting right atrial pressure of 15 mmHg. FINDINGS  Left Ventricle: Severe global hypokineiss; septal akinesis. Left ventricular ejection fraction, by estimation, is 10-15%%. The left ventricle has severely decreased function. The left ventricular internal cavity size was severely dilated. There is no left ventricular hypertrophy. Left  ventricular diastolic parameters are consistent with Grade II diastolic dysfunction (pseudonormalization). Elevated left atrial pressure. Right Ventricle: The right ventricular size is normal. Right vetricular wall thickness was not assessed. Right ventricular systolic function moderate to severely reduced. Left Atrium: Left atrial size was moderately dilated. Right Atrium: Right atrial size was moderately dilated. Pericardium: There is no evidence of pericardial effusion. Mitral Valve: The mitral valve is normal in structure. Moderate to severe mitral valve regurgitation. Tricuspid Valve: The tricuspid valve is normal in structure. Tricuspid valve regurgitation is moderate. Aortic Valve: The aortic valve is tricuspid. Aortic valve regurgitation is moderate to severe. Aortic regurgitation PHT measures 379 msec. Mild aortic valve sclerosis is present, with no evidence of aortic valve stenosis. Pulmonic Valve: The pulmonic valve was not well visualized. Pulmonic valve regurgitation is trivial. Aorta: The aortic root is normal in size and structure. Venous: The inferior vena cava is dilated in size with less than 50% respiratory variability, suggesting right atrial pressure of 15 mmHg. IAS/Shunts: No atrial level shunt detected by color flow Doppler.  LEFT VENTRICLE PLAX 2D LVIDd:         6.90 cm  Diastology LVIDs:         6.50 cm  LV e' medial:    3.70 cm/s LV PW:         1.00 cm  LV E/e' medial:  28.6 LV IVS:        1.10 cm  LV e' lateral:   8.70 cm/s LVOT diam:     1.90 cm  LV E/e' lateral: 12.2 LVOT Area:     2.84 cm  RIGHT VENTRICLE            IVC RV Basal diam:  3.50 cm    IVC diam: 2.00 cm RV S prime:     7.83 cm/s TAPSE (M-mode): 1.3 cm LEFT ATRIUM              Index       RIGHT ATRIUM           Index LA diam:        4.30 cm  2.56 cm/m  RA Area:     21.30 cm LA Vol (A2C):   108.0 ml 64.29 ml/m RA Volume:   69.10 ml  41.13 ml/m LA Vol (A4C):   53.3 ml  31.73 ml/m LA Biplane Vol: 75.4 ml  44.89 ml/m   AORTIC VALVE AI PHT:      379 msec  AORTA Ao Root diam: 3.10 cm MITRAL VALVE                TRICUSPID VALVE MV Area (PHT): 5.97 cm     TR Peak grad:   47.1 mmHg MV Decel Time: 127 msec     TR Vmax:        343.00 cm/s MR Peak grad: 73.3 mmHg MR Mean grad: 48.0 mmHg     SHUNTS MR Vmax:      428.00 cm/s   Systemic Diam: 1.90 cm MR Vmean:     326.0 cm/s MV E velocity: 106.00 cm/s MV A velocity: 96.00 cm/s MV E/A ratio:  1.10 Dietrich PatesPaula Ross MD Electronically signed by Dietrich PatesPaula Ross MD Signature Date/Time: 12/24/2020/1:00:43 PM    Final                LOS: 1 day   Jeremy Powers  [facility]   Pager on [email or web address]. If 7PM-7AM, please contact night-coverage at [email or web address]     12/25/2020, 1:27 PM

## 2020-12-25 NOTE — Plan of Care (Signed)
  Problem: Health Behavior/Discharge Planning: Goal: Ability to manage health-related needs will improve Outcome: Progressing   Problem: Clinical Measurements: Goal: Ability to maintain clinical measurements within normal limits will improve Outcome: Progressing   

## 2020-12-26 DIAGNOSIS — I340 Nonrheumatic mitral (valve) insufficiency: Secondary | ICD-10-CM | POA: Diagnosis present

## 2020-12-26 DIAGNOSIS — I5043 Acute on chronic combined systolic (congestive) and diastolic (congestive) heart failure: Secondary | ICD-10-CM

## 2020-12-26 DIAGNOSIS — I5084 End stage heart failure: Secondary | ICD-10-CM

## 2020-12-26 LAB — BASIC METABOLIC PANEL
Anion gap: 7 (ref 5–15)
BUN: 8 mg/dL (ref 8–23)
CO2: 26 mmol/L (ref 22–32)
Calcium: 8.3 mg/dL — ABNORMAL LOW (ref 8.9–10.3)
Chloride: 101 mmol/L (ref 98–111)
Creatinine, Ser: 0.91 mg/dL (ref 0.61–1.24)
GFR, Estimated: >60 mL/min (ref >60)
Glucose, Bld: 98 mg/dL (ref 70–99)
Potassium: 3.7 mmol/L (ref 3.5–5.1)
Sodium: 134 mmol/L — ABNORMAL LOW (ref 135–145)

## 2020-12-26 LAB — FERRITIN: Ferritin: 53 ng/mL (ref 24–336)

## 2020-12-26 LAB — IRON AND TIBC
Iron: 24 ug/dL — ABNORMAL LOW (ref 45–182)
Saturation Ratios: 7 % — ABNORMAL LOW (ref 17.9–39.5)
TIBC: 344 ug/dL (ref 250–450)
UIBC: 320 ug/dL

## 2020-12-26 LAB — LACTIC ACID, PLASMA: Lactic Acid, Venous: 2.6 mmol/L (ref 0.5–1.9)

## 2020-12-26 LAB — MAGNESIUM: Magnesium: 2 mg/dL (ref 1.7–2.4)

## 2020-12-26 MED ORDER — POTASSIUM CHLORIDE CRYS ER 20 MEQ PO TBCR
40.0000 meq | EXTENDED_RELEASE_TABLET | ORAL | Status: AC
  Administered 2020-12-26 (×2): 40 meq via ORAL
  Filled 2020-12-26 (×2): qty 2

## 2020-12-26 MED ORDER — DIGOXIN 125 MCG PO TABS
0.1250 mg | ORAL_TABLET | Freq: Every day | ORAL | Status: DC
  Administered 2020-12-26 – 2020-12-27 (×2): 0.125 mg via ORAL
  Filled 2020-12-26 (×2): qty 1

## 2020-12-26 NOTE — Consult Note (Signed)
   [facility] Inpatient Consult   12/26/2020  Jeremy Powers 08/25/1955 [id number]  [facility] [[facility]]  Accountable Care Organization [ACO] Patient: Medicare CMS DCE  Primary Care Provider:  [name] is with [facility] which is an Embedded provider.  Patient is active with the [facility] Clin[facility] and with Paramedicine note  Patient is currently on Airborne Precaution   Review of patient's medical record reveals patient is followed by the Heart and Vascular team. Plan: If patient has additional needs [facility] will follow for post hospital needs.    For questions contact:   Charlesetta Shanks, RN BSN CCM [facility]  [phone] business mobile phone Toll free office [phone]  Fax number: [phone] [email or web address]

## 2020-12-26 NOTE — Consult Note (Addendum)
Advanced Heart Failure Team Consult Note   Primary Physician: [name], PA-C PCP-Cardiologist:  Dr. Teressa Lower  Reason for Consultation: Acute on chronic systolic HF  HPI:    Jeremy Powers is seen today for evaluation of acute on chronic systolic HF at the request of Dr. Myriam Forehand with Hospitalist team.  This is a 65 year old male wit history of CAD s/p PCI to LAD in 2015 and 2016, s/p CABG 2017, systolic HF EF 20-25%, microcytic/iron deficiency anemia followed by hematology and GI, LBBB, and tobacco use. He is illiterate.  Admitted in 2020 with a/c CHF in setting of medication noncompliance.  EF 25-30%, previously 50-55% in 2019.   R/LHC showed severe 1 v disease with occluded LAD within previous stent. RCA and LCX normal. LIMA to LAD and SVG to D2 patent. Cath also showed low filling pressures with moderately low cardiac output.   Seen in the ED 10/25/20 for CHF exacerbation in the setting of medication non-compliance. He was diuresed with IV lasix and discharged home.  Had virtual visit with HF on 06/27. Endorsed symptoms concerning for COVID-19 infection. Had been missing does of medications and not following low sodium diet.   He was seen by his PCP on 08/05 with increased dyspnea and wheezing. Had not been feeling well 2-3 weeks. No known sick contacts. No fever or chills. Had some vomiting a few weeks ago. BNP >4,833. HS troponin 23 with flat trend. He was directed to the ED for additional evaluation. Tested positive for COVID-19. K 2.7, Scr 0.66, C02 24. Lactic acid elevated at 2.4 > 2.5. Chest x-ray demonstrated small left pleural effusion and mild pulmonary vascular congestion. He was given 40 mg lasix IV, potassium supplement and admitted for management of acute on chronic systolic HF.  He diuresed with IV lasix 60 mg BID which was switched to 40 mg IV BID this am.  Weight down 13 lb.   Reports less dyspnea after diuresis. Nocturnal cough and orthopnea improved. Less edema. No  chest pain. Reports he intermittently takes his meds. He thought they may be making him sick.  Denies alcohol or ETOH use.  Lives with his girlfriend and 2 of her children.    Echo 08/06: EF 10-15%, severe global hypokinesis with septal akinesis, LV severely dilated, grade II DD, RV fxn moderate to severely reduced, moderate to severe MR, moderate to severe AI, dilated IVC with estimated right atrial pressure of 15 mmHg  Review of Systems: [y] = yes, [ ]  = no   General: Weight gain [Y]; Weight loss [ ] ; Anorexia [ ] ; Fatigue [ ] ; Fever [ ] ; Chills [ ] ; Weakness [ ]   Cardiac: Chest pain/pressure [ ] ; Resting SOB [Y]; Exertional SOB [ ] ; Orthopnea [Y]; Pedal Edema [Y]; Palpitations [ ] ; Syncope [ ] ; Presyncope [ ] ; Paroxysmal nocturnal dyspnea[ ]   Pulmonary: Cough [Y]; Wheezing[Y]; Hemoptysis[ ] ; Sputum [ ] ; Snoring [ ]   GI: Vomiting[Y]; Dysphagia[ ] ; Melena[ ] ; Hematochezia [ ] ; Heartburn[ ] ; Abdominal pain [ ] ; Constipation [ ] ; Diarrhea [ ] ; BRBPR [ ]   GU: Hematuria[ ] ; Dysuria [ ] ; Nocturia[ ]   Vascular: Pain in legs with walking [ ] ; Pain in feet with lying flat [ ] ; Non-healing sores [ ] ; Stroke [ ] ; TIA [ ] ; Slurred speech [ ] ;  Neuro: Headaches[ ] ; Vertigo[ ] ; Seizures[ ] ; Paresthesias[ ] ;Blurred vision [ ] ; Diplopia [ ] ; Vision changes [ ]   Ortho/Skin: Arthritis [ ] ; Joint pain [ ] ; Muscle pain [ ] ;  Joint swelling [ ] ; Back Pain [ ] ; Rash [ ]   Psych: Depression[ ] ; Anxiety[ ]   Heme: Bleeding problems [ ] ; Clotting disorders [ ] ; Anemia [ ]   Endocrine: Diabetes [ ] ; Thyroid dysfunction[ ]   Home Medications Prior to Admission medications   Medication Sig Start Date End Date Taking? Authorizing Provider  aspirin EC 81 MG tablet Take 81 mg by mouth daily.   Yes [provider]  atorvastatin (LIPITOR) 40 MG tablet TAKE ONE TABLET BY MOUTH EVERYDAY TABLET 6PM FOR CHOLESTORL 11/15/20  Yes Veryl Abril, Bevelyn Bucklesaniel R, MD  carvedilol (COREG) 3.125 MG tablet Take 1 tablet (3.125 mg total) by  mouth 2 (two) times daily with a meal. 06/27/20  Yes Simmons, Brittainy M, PA-C  FARXIGA 10 MG TABS tablet TAKE ONE TABLET BY MOUTH DAILY IN THE MORNING BEFORE BREAKFAST 11/15/20  Yes Dierre Crevier, Bevelyn Bucklesaniel R, MD  FEROSUL 325 (65 Fe) MG tablet TAKE 1 TABLET BY MOUTH TWICE DAILY WITH A MEAL Patient taking differently: Take 325 mg by mouth daily with breakfast. 05/18/20  Yes Laurey MoraleMcLean, Dalton S, MD  potassium chloride SA (KLOR-CON) 20 MEQ tablet Take 3 tablets (60 mEq total) by mouth 2 (two) times daily. 10/24/20  Yes Simmons, Brittainy M, PA-C  sacubitril-valsartan (ENTRESTO) 49-51 MG Take 1 tablet by mouth 2 (two) times daily. 10/12/20  Yes Robbie LisSimmons, Brittainy M, PA-C  sertraline (ZOLOFT) 25 MG tablet Take 1 tablet (25 mg total) by mouth daily. 12/22/20  Yes Saguier, Ramon DredgeEdward, PA-C  spironolactone (ALDACTONE) 25 MG tablet TAKE ONE TABLET BY MOUTH AT BEDTIME 10/03/20  Yes Zavia Pullen, Bevelyn Bucklesaniel R, MD  furosemide (LASIX) 40 MG tablet Take 1 tablet (40 mg total) by mouth daily. 10/25/20   Muthersbaugh, Dahlia ClientHannah, PA-C    Past Medical History: Past Medical History:  Diagnosis Date   CHF (congestive heart failure) (HCC)    Cirrhosis (HCC)    Coronary artery disease    Hypertension    Pancreatitis     Past Surgical History: Past Surgical History:  Procedure Laterality Date   CARDIAC SURGERY     RIGHT/LEFT HEART CATH AND CORONARY/GRAFT ANGIOGRAPHY N/A 06/24/2018   Procedure: RIGHT/LEFT HEART CATH AND CORONARY/GRAFT ANGIOGRAPHY;  Surgeon: Dolores PattyBensimhon, Johncarlo Maalouf R, MD;  Location: [facility];  Service: Cardiovascular;  Laterality: N/A;    Family History: Family History  Problem Relation Age of Onset   Iron deficiency Neg Hx     Social History: Social History   Socioeconomic History   Marital status: Single    Spouse name: Not on file   Number of children: Not on file   Years of education: Not on file   Highest education level: Not on file  Occupational History   Not on file  Tobacco Use   Smoking status:  Former    Packs/day: 0.50    Types: Cigarettes   Smokeless tobacco: Never  Vaping Use   Vaping Use: Never used  Substance and Sexual Activity   Alcohol use: Yes    Comment: daily   Drug use: Yes    Types: Marijuana   Sexual activity: Not on file  Other Topics Concern   Not on file  Social History Narrative   Not on file   Social Determinants of Health   Financial Resource Strain: Not on file  Food Insecurity: Not on file  Transportation Needs: Not on file  Physical Activity: Not on file  Stress: Not on file  Social Connections: Not on file    Allergies:  No Known Allergies  Objective:    Vital Signs:   Temp:  [97.7 F (36.5 C)-99.3 F (37.4 C)] 98.5 F (36.9 C) (08/08 1102) Pulse Rate:  [64-84] 74 (08/08 1102) Resp:  [12-18] 18 (08/08 1102) BP: (85-112)/(46-70) 88/46 (08/08 1102) SpO2:  [91 %-95 %] 94 % (08/08 1102) Weight:  [55.3 kg] 55.3 kg (08/08 0400) Last BM Date: 12/25/20  Weight change: Filed Weights   12/24/20 0036 12/25/20 0052 12/26/20 0400  Weight: 60.2 kg 57.1 kg 55.3 kg    Intake/Output:   Intake/Output Summary (Last 24 hours) at 12/26/2020 1213 Last data filed at 12/26/2020 1210 Gross per 24 hour  Intake 700 ml  Output 1875 ml  Net -1175 ml      Physical Exam    General:  Chronically ill appearing, thin male. No resp difficulty HEENT: normal Neck: supple. JVP 9 cm. Carotids 2+ bilat; no bruits. No lymphadenopathy or thyromegaly appreciated. Cor: PMI nondisplaced. Regular rate & rhythm. No rubs, gallops or murmurs. Lungs: bibasilar crackles Abdomen: soft, nontender, nondistended. No hepatosplenomegaly. No bruits or masses. Good bowel sounds. Extremities: no cyanosis, clubbing, rash, edema Neuro: alert & orientedx3, cranial nerves grossly intact. moves all 4 extremities w/o difficulty. Affect pleasant   Telemetry   NSR 70s  EKG    Sinus 77 bpm, LBBB with QRS 196 ms  Labs   Basic Metabolic Panel: Recent Labs  Lab  12/23/20 1217 12/23/20 1753 12/24/20 0408 12/25/20 0326 12/26/20 0541  NA 140 137 138 137 134*  K 3.1* 2.7* 3.7 3.2* 3.7  CL 103 102 105 101 101  CO2 26 24 24 28 26   GLUCOSE 85 81 80 90 98  BUN 9 9 7* 7* 8  CREATININE 0.68 0.66 0.66 0.80 0.91  CALCIUM 8.4 8.2* 8.2* 8.2* 8.3*  MG  --  1.8  --  1.3* 2.0  PHOS  --  2.6  --   --   --     Liver Function Tests: Recent Labs  Lab 12/23/20 1217 12/23/20 1753 12/24/20 0408  AST 25 29 29   ALT 10 12 12   ALKPHOS 81 77 69  BILITOT 1.4* 1.4* 1.9*  PROT 7.2 7.8 6.5  ALBUMIN 3.3* 3.4* 2.8*   No results for input(s): LIPASE, AMYLASE in the last 168 hours. No results for input(s): AMMONIA in the last 168 hours.  CBC: Recent Labs  Lab 12/23/20 1217 12/23/20 1753 12/24/20 0408  WBC 4.7 5.1 3.5*  NEUTROABS 3.2 3.4  --   HGB 10.8* 11.2* 10.1*  HCT 33.2* 33.2* 30.9*  MCV 94.1 91.2 92.8  PLT 238.0 263 248    Cardiac Enzymes: No results for input(s): CKTOTAL, CKMB, CKMBINDEX, TROPONINI in the last 168 hours.  BNP: BNP (last 3 results) Recent Labs    10/12/20 1158 10/24/20 2002 12/23/20 1753  BNP >4,500.0* 2,439.0* >4,500.0*    ProBNP (last 3 results) Recent Labs    04/28/20 1111 12/23/20 1217  PROBNP 717.0* >4833.0*     CBG: No results for input(s): GLUCAP in the last 168 hours.  Coagulation Studies: Recent Labs    12/23/20 1753  LABPROT 16.0*  INR 1.3*     Imaging   No results found.   Medications:     Current Medications:  aspirin EC  81 mg Oral Daily   atorvastatin  40 mg Oral Daily   carvedilol  3.125 mg Oral BID WC   dapagliflozin propanediol  10 mg Oral Daily   enoxaparin (LOVENOX) injection  40 mg Subcutaneous Q24H   ferrous  sulfate  325 mg Oral Q breakfast   furosemide  40 mg Intravenous Q12H   guaiFENesin  600 mg Oral BID   sacubitril-valsartan  1 tablet Oral BID   sertraline  25 mg Oral Daily   sodium chloride flush  3 mL Intravenous Q12H   spironolactone  25 mg Oral QHS     Infusions:      Assessment/Plan   1.  Acute on chronic biventricular heart failure, NICM - Echo 06/2018 EF 25-30%,  cMRI 06/27/2018 with EF 24%, Echo 01/2019 EF 20-25% - Echo 07/15/20 EF 20-25% RV ok - Echo this admit with EF 10-15%, moderately to severely reduced RV function, moderate to severe MR, moderate to severe AI - Admit 08/05 with a/c CHF. BNP > 4,000. Lactic acid 2.5, C02 26. Some concern for low output HF. Diuresed well with IV lasix. Now on 40 mg BID. Replace K. Down 13 lb, 121 # (near prior baseline). - Recheck lactic acid - Continue Farxiga 10 mg daily. - Okay to continue spiro for now. - Stop beta blocker d/t a/c CHF - Hold Entresto d/t hypotension - Previously referred to EP for consideration of CRT-D with wide LBBB but didn't follow up. Likely too advanced currently to benefit - Not compliant with medications prior to admission   2. CAD - S/P CABG 2017, LHC 06/2018  with occluded LAD normal LCX and RCA. LIMA to LAD and SVG to D2 patent (SVG to D1 occluded) - Stable w/o CP.  - Continue aspirin 81 mg daily + atorvastatin.      3. LBBB - He was previously referred to EP for CRT-D with wide LBBB.   4. Valvular heart disease -Moderate to severe MR and moderate to severe AI on echo this admit. LVIDd 6.9 cm -Trivial MR and mild AI on echo in February.   5.  Tobacco Abuse - No longer smoking. Lives with people who do smoke.   6. HTN - Hypotensive. - See #1  7. Anemia -Hgb 10.1. -Iron deficient -Consider IV iron replacement in view of HFrEF   8. COVID-19 infection -Incidentally noted.  -He is not hypoxic. Afebrile   SDOH: He is illiterate and needs assistance with medications. Has paramedicine in the community.   Length of Stay: 2  FINCH, LINDSAY N, PA-C  12/26/2020, 12:13 PM  Advanced Heart Failure Team Pager [phone] (M-F; 7a - 5p)  Please contact [facility] for night-coverage after hours (4p -7a ) and weekends on [email or web address]    Patient seen  and examined with the above-signed Advanced Practice Provider and/or Housestaff. I personally reviewed laboratory data, imaging studies and relevant notes. I independently examined the patient and formulated the important aspects of the plan. I have edited the note to reflect any of my changes or salient points. I have personally discussed the plan with the patient and/or family.  65 y/o male with end-stage biventricular HF, CAD s/p CABG and noncompliance.  Admitted with A/C systolic HF. Incidentally found to have COIVD-19.   Echo EF 10-15% with severely dilated LV. Severe MR, mod AI severe RV HK  Has diuresed 13 pounds.Renal function stable. Bicarb ok.  Feels much better. Ambulating room. Says he is back to baseline.   General: Thin male lying flat in bed.  No resp difficulty HEENT: normal Neck: supple. JVP 7. Carotids 2+ bilat; no bruits. No lymphadenopathy or thryomegaly appreciated. Cor: PMI laterally displaced. Regular rate & rhythm.3/6 MR = Lungs: clear Abdomen: soft, nontender, nondistended. No hepatosplenomegaly. No bruits or  masses. Good bowel sounds. Extremities: no cyanosis, clubbing, rash, edema warm  Neuro: alert & orientedx3, cranial nerves grossly intact. moves all 4 extremities w/o difficulty. Affect pleasant  He has end-stage biventricular HF. But seems to be back to baseline with improved volume status and no evidence of end-organ hypoperfusion. He is not a candidate for advanced therapies of any sort.   Will continue to hold b-blocker. Stop IV lasix. Add digoxin. Can restart Entresto or losartan as BP tolerates. Suspect he may be able to go home tomorrow.   Continue paramedicine support. Can consider Palliative Care referral as outpatient.   Arvilla Meres, MD  5:39 PM

## 2020-12-26 NOTE — Progress Notes (Addendum)
Progress Note    Jeremy Powers  VQQ:[id number] DOB: 01-23-1956  DOA: 12/23/2020 PCP: [name], PA-C      Brief Narrative:    Medical records reviewed and are as summarized below:  Jeremy Powers is a 65 y.o. 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  He was admitted to the hospital for acute exacerbation of chronic systolic and diastolic CHF.  He was treated with IV Lasix.  He also had hypokalemia that was repleted.  Incidentally, he tested positive for COVID-19 infection.    Assessment/Plan:   Principal Problem:   CHF exacerbation (HCC) Active Problems:   Aortic insufficiency   CAD (coronary artery disease)   Hyperlipidemia   Presence of aortocoronary bypass graft   COVID-19 virus infection   Body mass index is 19.68 kg/m.   Acute on chronic systolic and diastolic CHF: BNP > 4,500.  IV Lasix has been held because of hypotension.  Consulted cardiologist for further management.  Plan for right heart cath noted.  Continue dapagliflozin.   He remains on Aldactone.  Check iron panel  Hypotension: Entresto and carvedilol have been held because of hypotension.  Monitor BP closely.    2D echo in February 2020 showed EF estimated at 20 to 25%, grade 2 diastolic dysfunction.  Repeat 2D echo showed EF estimated at 10 to 15%, grade 2 diastolic dysfunction,, moderately dilated left and right atria, moderate to severe MR, moderate TR, moderate to severe AR.  Hypokalemia and hypomagnesemia: Improved.  CAD with previous PCI and CABG: continue aspirin and Lipitor  COVID-19 infection: Asymptomatic.  He is on airborne and contact isolation.  Other comorbidities include iron deficiency anemia, depression, hyperlipidemia,  hypertension  Initially, patient wanted to leave AGAINST MEDICAL ADVICE.  However, he has been confused to stay for further management.  The gravity of his heart disease was explained in detail.  He understands that he is at increased risk of death    Diet Order             Diet Heart Room service appropriate? Yes; Fluid consistency: Thin; Fluid restriction: 1800 mL Fluid  Diet effective now                      Consultants: None  Procedures: None    Medications:    aspirin EC  81 mg Oral Daily   atorvastatin  40 mg Oral Daily   carvedilol  3.125 mg Oral BID WC   dapagliflozin propanediol  10 mg Oral Daily   enoxaparin (LOVENOX) injection  40 mg Subcutaneous Q24H   ferrous sulfate  325 mg Oral Q breakfast   furosemide  40 mg Intravenous Q12H   guaiFENesin  600 mg Oral BID   potassium chloride  40 mEq Oral Q4H   sacubitril-valsartan  1 tablet Oral BID   sertraline  25 mg Oral Daily   sodium chloride flush  3 mL Intravenous Q12H   spironolactone  25 mg Oral QHS   Continuous Infusions:   Anti-infectives (From admission, onward)    None              Family Communication/Anticipated D/C date and plan/Code Status   DVT prophylaxis: enoxaparin (LOVENOX) injection 40 mg Start: 12/24/20 0600  Code Status: Full Code  Family Communication: His wife over the phone Disposition Plan:    Status is: Inpatient  Remains inpatient appropriate because:IV treatments appropriate due to intensity of illness or inability to take PO and Inpatient level of care appropriate due to severity of illness  Dispo: The patient is from: Home              Anticipated d/c is to: Home              Patient currently is not medically stable to d/c.   Difficult to place patient No           Subjective:   Interval events noted.  No shortness of breath or chest pain.  He feels a little better today.  He wants to go home today.  Objective:    Vitals:    12/26/20 0400 12/26/20 0700 12/26/20 0745 12/26/20 1102  BP: (!) 106/50 112/70  (!) 88/46  Pulse: 84   74  Resp: 17 17  18   Temp: 99.3 F (37.4 C)  98.6 F (37 C) 98.5 F (36.9 C)  TempSrc: Oral  Oral Oral  SpO2: 93% 91% 94% 94%  Weight: 55.3 kg     Height:       No data found.   Intake/Output Summary (Last 24 hours) at 12/26/2020 1324 Last data filed at 12/26/2020 1210 Gross per 24 hour  Intake 600 ml  Output 1200 ml  Net -600 ml   Filed Weights   12/24/20 0036 12/25/20 0052 12/26/20 0400  Weight: 60.2 kg 57.1 kg 55.3 kg    Exam:  GEN: NAD SKIN: No rash EYES: EOMI ENT: MMM CV: RRR PULM: Bibasilar rales.  No wheezing or rhonchi. ABD: soft, ND, NT, +BS CNS: AAO x 3, non focal EXT: No edema or tenderness         Data Reviewed:   I have personally reviewed following labs and imaging studies:  Labs: Labs show the following:   Basic Metabolic Panel: Recent Labs  Lab 12/23/20 1217 12/23/20 1753 12/24/20 0408 12/25/20 0326 12/26/20 0541  NA 140 137 138 137 134*  K 3.1* 2.7* 3.7 3.2* 3.7  CL 103 102 105 101 101  CO2 26 24 24 28 26   GLUCOSE 85 81 80 90 98  BUN 9 9 7* 7* 8  CREATININE 0.68 0.66 0.66 0.80 0.91  CALCIUM 8.4 8.2* 8.2* 8.2* 8.3*  MG  --  1.8  --  1.3* 2.0  PHOS  --  2.6  --   --   --    GFR Estimated Creatinine Clearance: 62.5 mL/min (by C-G formula based on SCr of 0.91 mg/dL). Liver Function Tests: Recent Labs  Lab 12/23/20 1217 12/23/20 1753 12/24/20 0408  AST 25 29 29   ALT 10 12 12   ALKPHOS 81 77 69  BILITOT 1.4* 1.4* 1.9*  PROT 7.2 7.8 6.5  ALBUMIN 3.3* 3.4* 2.8*   No results for input(s): LIPASE, AMYLASE in the last 168 hours. No results for input(s): AMMONIA in the last 168 hours. Coagulation profile Recent Labs  Lab 12/23/20 1753  INR 1.3*    CBC: Recent Labs  Lab 12/23/20 1217 12/23/20 1753 12/24/20 0408  WBC 4.7 5.1 3.5*  NEUTROABS 3.2 3.4  --   HGB 10.8* 11.2* 10.1*  HCT 33.2* 33.2* 30.9*  MCV 94.1 91.2  92.8  PLT 238.0 263 248   Cardiac Enzymes: No results for input(s): CKTOTAL, CKMB, CKMBINDEX, TROPONINI in the last 168 hours. BNP (  last 3 results) Recent Labs    04/28/20 1111 12/23/20 1217  PROBNP 717.0* >4833.0*   CBG: No results for input(s): GLUCAP in the last 168 hours. D-Dimer: No results for input(s): DDIMER in the last 72 hours. Hgb A1c: No results for input(s): HGBA1C in the last 72 hours. Lipid Profile: No results for input(s): CHOL, HDL, LDLCALC, TRIG, CHOLHDL, LDLDIRECT in the last 72 hours. Thyroid function studies: No results for input(s): TSH, T4TOTAL, T3FREE, THYROIDAB in the last 72 hours.  Invalid input(s): FREET3 Anemia work up: No results for input(s): VITAMINB12, FOLATE, FERRITIN, TIBC, IRON, RETICCTPCT in the last 72 hours. Sepsis Labs: Recent Labs  Lab 12/23/20 1217 12/23/20 1753 12/23/20 1815 12/24/20 0408  WBC 4.7 5.1  --  3.5*  LATICACIDVEN  --   --  2.5*  2.4*  --     Microbiology Recent Results (from the past 240 hour(s))  Resp Panel by RT-PCR (Flu A&B, Covid) Nasopharyngeal Swab     Status: Abnormal   Collection Time: 12/23/20  6:15 PM   Specimen: Nasopharyngeal Swab; Nasopharyngeal(NP) swabs in vial transport medium  Result Value Ref Range Status   SARS Coronavirus 2 by RT PCR POSITIVE (A) NEGATIVE Final    Comment: RESULT CALLED TO, READ BACK BY AND VERIFIED WITH:  COBLE,S RN @ 1902 12/23/20 EDENSCA (NOTE) SARS-CoV-2 target nucleic acids are DETECTED.  The SARS-CoV-2 RNA is generally detectable in upper respiratory specimens during the acute phase of infection. Positive results are indicative of the presence of the identified virus, but do not rule out bacterial infection or co-infection with other pathogens not detected by the test. Clinical correlation with patient history and other diagnostic information is necessary to determine patient infection status. The expected result is Negative.  Fact Sheet for  Patients: BloggerCourse.com  Fact Sheet for Healthcare Providers: SeriousBroker.it  This test is not yet approved or cleared by the [place] FDA and  has been authorized for detection and/or diagnosis of SARS-CoV-2 by FDA under an Emergency Use Authorization (EUA).  This EUA will remain in effect (meaning this test can be  used) for the duration of  the COVID-19 declaration under Section 564(b)(1) of the Act, 21 U.S.C. section 360bbb-3(b)(1), unless the authorization is terminated or revoked sooner.     Influenza A by PCR NEGATIVE NEGATIVE Final   Influenza B by PCR NEGATIVE NEGATIVE Final    Comment: (NOTE) The Xpert Xpress SARS-CoV-2/FLU/RSV plus assay is intended as an aid in the diagnosis of influenza from Nasopharyngeal swab specimens and should not be used as a sole basis for treatment. Nasal washings and aspirates are unacceptable for Xpert Xpress SARS-CoV-2/FLU/RSV testing.  Fact Sheet for Patients: BloggerCourse.com  Fact Sheet for Healthcare Providers: SeriousBroker.it  This test is not yet approved or cleared by the [place] FDA and has been authorized for detection and/or diagnosis of SARS-CoV-2 by FDA under an Emergency Use Authorization (EUA). This EUA will remain in effect (meaning this test can be used) for the duration of the COVID-19 declaration under Section 564(b)(1) of the Act, 21 U.S.C. section 360bbb-3(b)(1), unless the authorization is terminated or revoked.  Performed at [facility], [address] [place], [place] [address]     Procedures and diagnostic studies:  No results found.             LOS: 2 days   Kerryann Allaire  [facility]   Pager on [email or web address]. If 7PM-7AM, please contact night-coverage at [email or web address]  12/26/2020, 1:24 PM

## 2020-12-26 NOTE — Progress Notes (Signed)
Patient considering leaving AMA. He spoke to wife and asked RN to speak to her by phone. She requests that the MD call her and speak to her so that she can help explain things to patient. RN has messaged MD to please call and speak to her.

## 2020-12-27 DIAGNOSIS — D509 Iron deficiency anemia, unspecified: Secondary | ICD-10-CM

## 2020-12-27 DIAGNOSIS — I5023 Acute on chronic systolic (congestive) heart failure: Secondary | ICD-10-CM

## 2020-12-27 LAB — BASIC METABOLIC PANEL
Anion gap: 6 (ref 5–15)
BUN: 8 mg/dL (ref 8–23)
CO2: 27 mmol/L (ref 22–32)
Calcium: 8.3 mg/dL — ABNORMAL LOW (ref 8.9–10.3)
Chloride: 100 mmol/L (ref 98–111)
Creatinine, Ser: 1.06 mg/dL (ref 0.61–1.24)
GFR, Estimated: >60 mL/min (ref >60)
Glucose, Bld: 97 mg/dL (ref 70–99)
Potassium: 4.5 mmol/L (ref 3.5–5.1)
Sodium: 133 mmol/L — ABNORMAL LOW (ref 135–145)

## 2020-12-27 LAB — CBC
HCT: 33.4 % — ABNORMAL LOW (ref 39.0–52.0)
Hemoglobin: 11 g/dL — ABNORMAL LOW (ref 13.0–17.0)
MCH: 30.7 pg (ref 26.0–34.0)
MCHC: 32.9 g/dL (ref 30.0–36.0)
MCV: 93.3 fL (ref 80.0–100.0)
Platelets: 267 10*3/uL (ref 150–400)
RBC: 3.58 MIL/uL — ABNORMAL LOW (ref 4.22–5.81)
RDW: 22.3 % — ABNORMAL HIGH (ref 11.5–15.5)
WBC: 15.7 10*3/uL — ABNORMAL HIGH (ref 4.0–10.5)
nRBC: 0 % (ref 0.0–0.2)

## 2020-12-27 LAB — MAGNESIUM: Magnesium: 1.9 mg/dL (ref 1.7–2.4)

## 2020-12-27 MED ORDER — FERROUS SULFATE 325 (65 FE) MG PO TABS: 325.0000 mg | ORAL_TABLET | Freq: Every day | ORAL | Status: DC

## 2020-12-27 MED ORDER — DIGOXIN 125 MCG PO TABS: 0.1250 mg | ORAL_TABLET | Freq: Every day | ORAL | 0 refills | Status: DC

## 2020-12-27 MED ORDER — FUROSEMIDE 40 MG PO TABS
40.0000 mg | ORAL_TABLET | Freq: Every day | ORAL | Status: DC
  Administered 2020-12-27: 40 mg via ORAL
  Filled 2020-12-27: qty 1

## 2020-12-27 MED ORDER — SODIUM CHLORIDE 0.9 % IV SOLN
200.0000 mg | Freq: Once | INTRAVENOUS | Status: AC
  Administered 2020-12-27: 200 mg via INTRAVENOUS
  Filled 2020-12-27: qty 10

## 2020-12-27 MED ORDER — FUROSEMIDE 40 MG PO TABS: 40.0000 mg | ORAL_TABLET | Freq: Every day | ORAL | 0 refills | Status: DC

## 2020-12-27 NOTE — Plan of Care (Signed)

## 2020-12-27 NOTE — TOC Transition Note (Signed)
Transition of Care [facility]) - CM/SW Discharge Note Heart Failure   Patient Details  Name: Jeremy Powers MRN: [id number] Date of Birth: [birth year]  Transition of Care [facility]) CM/SW Contact:  Shanena Pellegrino, LCSWA Phone Number: 12/27/2020, 12:07 PM   Clinical Narrative:    CSW spoke with the patient over the phone due to Jeremy Powers COVID+ status. CSW completed a very brief SDOH with the patient who denied having any needs at this time. Patient reported they do have a PCP and he can get to the pharmacy to pick up his medications. CSW informed Jeremy Powers about his outpatient Advanced HF follow up appointment on 01/03/21 at 11:30am and this will be on his discharge paperwork and Jeremy Powers reported that he does have transportation to get to that appointment. Jeremy Powers reported that his family will provide transportation home today at discharge.  CSW will sign off for now as social work intervention is no longer needed. Please consult [place] again if new needs arise.   Final next level of care: Home/Self Care Barriers to Discharge: No Barriers Identified   Patient Goals and CMS Choice        Discharge Placement                       Discharge Plan and Services In-house Referral: Clinical Social Work                                   Social Determinants of Health (SDOH) Interventions Housing Interventions: Intervention Not Indicated Transportation Interventions: Intervention Not Indicated (Pt. reported having transportation)   Readmission Risk Interventions No flowsheet data found.  Jeremy Powers, MSW, LCSWA [phone] Heart Failure Social Worker

## 2020-12-27 NOTE — Progress Notes (Signed)
Advanced Heart Failure Rounding Note   Subjective:     Feels good. Denies CP or SOB. SBP 90-100.    Objective:   Weight Range:  Vital Signs:   Temp:  [98.7 F (37.1 C)-99.6 F (37.6 C)] 99 F (37.2 C) (08/09 0400) Pulse Rate:  [73-93] 79 (08/09 1100) Resp:  [11-23] 16 (08/09 0900) BP: (86-111)/(50-70) 111/61 (08/09 1100) SpO2:  [92 %-99 %] 98 % (08/09 1100) Weight:  [55.4 kg] 55.4 kg (08/09 0500) Last BM Date: 12/25/20  Weight change: Filed Weights   12/25/20 0052 12/26/20 0400 12/27/20 0500  Weight: 57.1 kg 55.3 kg 55.4 kg    Intake/Output:   Intake/Output Summary (Last 24 hours) at 12/27/2020 1119 Last data filed at 12/27/2020 0900 Gross per 24 hour  Intake 240 ml  Output 400 ml  Net -160 ml     Physical Exam: General:  Thin male No resp difficulty HEENT: normal Neck: supple. JVP 7 . Carotids 2+ bilat; no bruits. No lymphadenopathy or thryomegaly appreciated. Cor: PMI nondisplaced. Regular rate & rhythm. No rubs, gallops or murmurs. Lungs: clear Abdomen: soft, nontender, nondistended. No hepatosplenomegaly. No bruits or masses. Good bowel sounds. Extremities: no cyanosis, clubbing, rash, edema Neuro: alert & orientedx3, cranial nerves grossly intact. moves all 4 extremities w/o difficulty. Affect pleasant  Telemetry: Sinus 70-80s  Labs: Basic Metabolic Panel: Recent Labs  Lab 12/23/20 1753 12/24/20 0408 12/25/20 0326 12/26/20 0541 12/27/20 0307  NA 137 138 137 134* 133*  K 2.7* 3.7 3.2* 3.7 4.5  CL 102 105 101 101 100  CO2 24 24 28 26 27   GLUCOSE 81 80 90 98 97  BUN 9 7* 7* 8 8  CREATININE 0.66 0.66 0.80 0.91 1.06  CALCIUM 8.2* 8.2* 8.2* 8.3* 8.3*  MG 1.8  --  1.3* 2.0 1.9  PHOS 2.6  --   --   --   --     Liver Function Tests: Recent Labs  Lab 12/23/20 1217 12/23/20 1753 12/24/20 0408  AST 25 29 29   ALT 10 12 12   ALKPHOS 81 77 69  BILITOT 1.4* 1.4* 1.9*  PROT 7.2 7.8 6.5  ALBUMIN 3.3* 3.4* 2.8*   No results for input(s):  LIPASE, AMYLASE in the last 168 hours. No results for input(s): AMMONIA in the last 168 hours.  CBC: Recent Labs  Lab 12/23/20 1217 12/23/20 1753 12/24/20 0408 12/27/20 0307  WBC 4.7 5.1 3.5* 15.7*  NEUTROABS 3.2 3.4  --   --   HGB 10.8* 11.2* 10.1* 11.0*  HCT 33.2* 33.2* 30.9* 33.4*  MCV 94.1 91.2 92.8 93.3  PLT 238.0 263 248 267    Cardiac Enzymes: No results for input(s): CKTOTAL, CKMB, CKMBINDEX, TROPONINI in the last 168 hours.  BNP: BNP (last 3 results) Recent Labs    10/12/20 1158 10/24/20 2002 12/23/20 1753  BNP >4,500.0* 2,439.0* >4,500.0*    ProBNP (last 3 results) Recent Labs    04/28/20 1111 12/23/20 1217  PROBNP 717.0* >4833.0*      Other results:  Imaging: No results found.   Medications:     Scheduled Medications:  aspirin EC  81 mg Oral Daily   atorvastatin  40 mg Oral Daily   dapagliflozin propanediol  10 mg Oral Daily   digoxin  0.125 mg Oral Daily   enoxaparin (LOVENOX) injection  40 mg Subcutaneous Q24H   ferrous sulfate  325 mg Oral Q breakfast   furosemide  40 mg Oral Daily   guaiFENesin  600 mg  Oral BID   sertraline  25 mg Oral Daily   sodium chloride flush  3 mL Intravenous Q12H   spironolactone  25 mg Oral QHS    Infusions:   PRN Medications: acetaminophen **OR** acetaminophen   Assessment/Plan:    1.  Acute on chronic biventricular heart failure, NICM - Echo 06/2018 EF 25-30%,  cMRI 06/27/2018 with EF 24%, Echo 01/2019 EF 20-25% - Echo 07/15/20 EF 20-25% RV ok - Echo this admit with EF 10-15%, moderately to severely reduced RV function, moderate to severe MR, moderate to severe AI - Previously referred to EP for consideration of CRT-D with wide LBBB but didn't follow up. Likely too advanced currently to benefit - Admit 08/05 with a/c CHF. BNP > 4,000. Lactic acid 2.5, C02 26. Some concern for low output HF. Diuresed well with IV lasix. Now on 40 mg BID. Replace K. Down 13 lb, 121 # (near prior baseline). - He is much  improved today. Ok for d/c from our standpoint. Not candidate for advanced therapies. - HF meds on d/c  - Lasix 40 daily - Farxiga 10 mg daily - Digoxin 0.125 daily - Spiro 25 daily - ASA 81 - Atorva 40   Hold Entresto and carvedilol   Will arrange Paramedicine f/u    2. CAD - S/P CABG 2017, LHC 06/2018  with occluded LAD normal LCX and RCA. LIMA to LAD and SVG to D2 patent (SVG to D1 occluded) - No s/s angina  - Continue aspirin 81 mg daily + atorvastatin.      3. LBBB - Previously referred to EP for consideration of CRT-D with wide LBBB but didn't follow up. Likely too advanced currently to benefit   4. Valvular heart disease -Moderate to severe MR and moderate to severe AI on echo this admit. LVIDd 6.9 cm -Trivial MR and mild AI on echo in February.   5.  Tobacco Abuse - No longer smoking. Lives with people who do smoke.   6. HTN - meds changed as above    7. Anemia -Hgb 10.1. -Iron deficient -Consider IV iron replacement in view of HFrEF   8. COVID-19 infection -Incidentally noted. -He is not hypoxic. Afebrile - Pre primary team    HF team will s/o     Length of Stay: 3   Arvilla Meres MD 12/27/2020, 11:19 AM  Advanced Heart Failure Team Pager [phone] (M-F; 7a - 4p)  Please contact [facility] for night-coverage after hours (4p -7a ) and weekends on [email or web address]

## 2020-12-27 NOTE — Plan of Care (Signed)
Problem: Education: Goal: Knowledge of General Education information will improve Description: Including pain rating scale, medication(s)/side effects and non-pharmacologic comfort measures 12/27/2020 1259 by Sonny Masters, RN Outcome: Adequate for Discharge 12/27/2020 1100 by Sonny Masters, RN Outcome: Progressing   Problem: Health Behavior/Discharge Planning: Goal: Ability to manage health-related needs will improve 12/27/2020 1259 by Sonny Masters, RN Outcome: Adequate for Discharge 12/27/2020 1100 by Sonny Masters, RN Outcome: Progressing   Problem: Clinical Measurements: Goal: Ability to maintain clinical measurements within normal limits will improve 12/27/2020 1259 by Sonny Masters, RN Outcome: Adequate for Discharge 12/27/2020 1100 by Sonny Masters, RN Outcome: Progressing Goal: Will remain free from infection 12/27/2020 1259 by Sonny Masters, RN Outcome: Adequate for Discharge 12/27/2020 1100 by Sonny Masters, RN Outcome: Progressing Goal: Diagnostic test results will improve 12/27/2020 1259 by Sonny Masters, RN Outcome: Adequate for Discharge 12/27/2020 1100 by Sonny Masters, RN Outcome: Progressing Goal: Respiratory complications will improve 12/27/2020 1259 by Sonny Masters, RN Outcome: Adequate for Discharge 12/27/2020 1100 by Sonny Masters, RN Outcome: Progressing Goal: Cardiovascular complication will be avoided 12/27/2020 1259 by Sonny Masters, RN Outcome: Adequate for Discharge 12/27/2020 1100 by Sonny Masters, RN Outcome: Progressing   Problem: Activity: Goal: Risk for activity intolerance will decrease 12/27/2020 1259 by Sonny Masters, RN Outcome: Adequate for Discharge 12/27/2020 1100 by Sonny Masters, RN Outcome: Progressing   Problem: Nutrition: Goal: Adequate nutrition will be maintained 12/27/2020 1259 by Sonny Masters, RN Outcome: Adequate for Discharge 12/27/2020 1100 by Sonny Masters, RN Outcome: Progressing   Problem: Coping: Goal: Level of anxiety will  decrease 12/27/2020 1259 by Sonny Masters, RN Outcome: Adequate for Discharge 12/27/2020 1100 by Sonny Masters, RN Outcome: Progressing   Problem: Elimination: Goal: Will not experience complications related to bowel motility 12/27/2020 1259 by Sonny Masters, RN Outcome: Adequate for Discharge 12/27/2020 1100 by Sonny Masters, RN Outcome: Progressing Goal: Will not experience complications related to urinary retention 12/27/2020 1259 by Sonny Masters, RN Outcome: Adequate for Discharge 12/27/2020 1100 by Sonny Masters, RN Outcome: Progressing   Problem: Pain Managment: Goal: General experience of comfort will improve 12/27/2020 1259 by Sonny Masters, RN Outcome: Adequate for Discharge 12/27/2020 1100 by Sonny Masters, RN Outcome: Progressing   Problem: Safety: Goal: Ability to remain free from injury will improve 12/27/2020 1259 by Sonny Masters, RN Outcome: Adequate for Discharge 12/27/2020 1100 by Sonny Masters, RN Outcome: Progressing   Problem: Skin Integrity: Goal: Risk for impaired skin integrity will decrease 12/27/2020 1259 by Sonny Masters, RN Outcome: Adequate for Discharge 12/27/2020 1100 by Sonny Masters, RN Outcome: Progressing   Problem: Education: Goal: Ability to demonstrate management of disease process will improve 12/27/2020 1259 by Sonny Masters, RN Outcome: Adequate for Discharge 12/27/2020 1100 by Sonny Masters, RN Outcome: Progressing Goal: Ability to verbalize understanding of medication therapies will improve 12/27/2020 1259 by Sonny Masters, RN Outcome: Adequate for Discharge 12/27/2020 1100 by Sonny Masters, RN Outcome: Progressing Goal: Individualized Educational Video(s) 12/27/2020 1259 by Sonny Masters, RN Outcome: Adequate for Discharge 12/27/2020 1100 by Sonny Masters, RN Outcome: Progressing   Problem: Activity: Goal: Capacity to carry out activities will improve 12/27/2020 1259 by Sonny Masters, RN Outcome: Adequate for Discharge 12/27/2020 1100 by Sonny Masters,  RN Outcome: Progressing   Problem: Cardiac: Goal: Ability to achieve and maintain adequate cardiopulmonary perfusion  will improve 12/27/2020 1259 by Sonny Masters, RN Outcome: Adequate for Discharge 12/27/2020 1100 by Sonny Masters, RN Outcome: Progressing

## 2020-12-27 NOTE — Discharge Summary (Signed)
Physician Discharge Summary  Jeremy Powers FHL:[id number] DOB: 1956/02/16 DOA: 12/23/2020  PCP: [name], PA-C  Admit date: 12/23/2020 Discharge date: 12/27/2020  Discharge disposition: Home   Recommendations for Outpatient Follow-Up:   Follow-up with CHF clinic on 01/03/2021 as scheduled. Follow-up with PCP in 1 week.  Discharge Diagnosis:   Principal Problem:   Acute on chronic systolic HF (heart failure) (HCC) Active Problems:   Aortic insufficiency   CAD (coronary artery disease)   Left bundle branch block   CHF exacerbation (HCC)   Hyperlipidemia   Iron deficiency anemia   Presence of aortocoronary bypass graft   COVID-19 virus infection   Mitral regurgitation    Discharge Condition: Stable.  Diet recommendation:  Diet Order             Diet - low sodium heart healthy           Diet Heart Room service appropriate? Yes; Fluid consistency: Thin; Fluid restriction: 1800 mL Fluid  Diet effective now                     Code Status: Full Code     Hospital Course:   [name] is a 65 y.o. 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   He was admitted to the hospital for acute exacerbation of chronic systolic and diastolic CHF.  He was treated with IV Lasix.  2D echo showed EF estimated at 10 to 15%.  He developed hypotension with treatment so cardiologist was consulted to assist with management.  Cardiologist recommended that Entresto and carvedilol be discontinued at discharge.  He was given IV iron infusion for iron deficiency anemia.   He also had hypokalemia that was repleted.  Incidentally, he tested positive for COVID-19 infection.  He was asymptomatic from potential COVID-19 infection was not  treated.  His condition has improved and he is deemed stable for discharge to home today.  The importance of medical adherence was emphasized.      Medical Consultants:   Cardiologist   Discharge Exam:    Vitals:   12/27/20 0500 12/27/20 0600 12/27/20 0900 12/27/20 1100  BP: (!) 106/59 (!) 104/56 (!) 96/53 111/61  Pulse: 75 76 73 79  Resp: (!) 21 18 16    Temp:      TempSrc:      SpO2: 93% 93% 93% 98%  Weight: 55.4 kg     Height:         GEN: NAD SKIN: Warm and dry EYES: No pallor or icterus ENT: MMM CV: RRR PULM: CTA B ABD: soft, ND, NT, +BS CNS: AAO x 3, non focal EXT: No edema or tenderness   The results of significant diagnostics from this hospitalization (including imaging, microbiology, ancillary and laboratory) are listed below for reference.     Procedures and Diagnostic Studies:   DG Chest 2 View  Result Date: 12/23/2020 CLINICAL DATA:  pedal edema, wt gain, hx of chf and wheezing. EXAM: CHEST - 2 VIEW COMPARISON:  10/24/2020 FINDINGS: Stable cardiomegaly. Prior median sternotomy. Atherosclerotic calcification of the aortic knob. Coronary artery stent is visualized. Small left pleural effusion. Mild vascular congestion without overt edema. No pneumothorax. IMPRESSION: 1. Small left pleural effusion. 2. Cardiomegaly and mild pulmonary vascular congestion. Electronically Signed  By: Duanne GuessNicholas  Plundo D.O.   On: 12/23/2020 13:12   DG Chest Port 1 View  Result Date: 12/23/2020 CLINICAL DATA:  Shortness of breath EXAM: PORTABLE CHEST 1 VIEW COMPARISON:  12/23/2020, 10/24/2020 FINDINGS: Post sternotomy changes. Cardiomegaly with small left effusion. Patchy airspace disease at the left mid lung. IMPRESSION: 1. Cardiomegaly with small left effusion 2. Patchy atelectasis or minimal infiltrate in the left mid to lower lung Electronically Signed   By: Jasmine PangKim  Fujinaga M.D.   On: 12/23/2020 18:43   ECHOCARDIOGRAM COMPLETE  Result Date: 12/24/2020    ECHOCARDIOGRAM REPORT    Patient Name:   Jeremy Powers Date of Exam: 12/24/2020 Medical Rec #:  [id number]     Height:       66.0 in Accession #:    [id number]    Weight:       132.7 lb Date of Birth:  22-Dec-1955     BSA:          1.680 m Patient Age:    [age] years      BP:           114/67 mmHg Patient Gender: M             HR:           78 bpm. Exam Location:  Inpatient Procedure: 2D Echo, Cardiac Doppler and Color Doppler Indications:    CHF Acute Systolic I50.21  History:        Patient has prior history of Echocardiogram examinations, most                 recent 07/15/2020. CHF; Prior CABG. Covid 19 positive.  Sonographer:    Roosvelt Maserachel Lane RDCS Referring Phys: [id number] Cecille PoLEXANDER B MELVIN IMPRESSIONS  1. Compared to prior study from 07/15/20, RVEF is depressed; LVEF is probably worse and Valvular dysfunction is worse.  2. Severe global hypokineiss; septal akinesis.. Left ventricular ejection fraction, by estimation, is 10-15%. The left ventricle has severely decreased function. The left ventricular internal cavity size was severely dilated. Left ventricular diastolic parameters are consistent with Grade II diastolic dysfunction (pseudonormalization). Elevated left atrial pressure.  3. Right ventricular systolic function moderate to severely reduced. The right ventricular size is normal.  4. Left atrial size was moderately dilated.  5. Right atrial size was moderately dilated.  6. The mitral valve is normal in structure. Moderate to severe mitral valve regurgitation.  7. Tricuspid valve regurgitation is moderate.  8. The aortic valve is tricuspid. Aortic valve regurgitation is moderate to severe. Mild aortic valve sclerosis is present, with no evidence of aortic valve stenosis.  9. The inferior vena cava is dilated in size with <50% respiratory variability, suggesting right atrial pressure of 15 mmHg. FINDINGS  Left Ventricle: Severe global hypokineiss; septal akinesis. Left ventricular ejection fraction, by estimation, is 10-15%%. The left  ventricle has severely decreased function. The left ventricular internal cavity size was severely dilated. There is no left ventricular hypertrophy. Left ventricular diastolic parameters are consistent with Grade II diastolic dysfunction (pseudonormalization). Elevated left atrial pressure. Right Ventricle: The right ventricular size is normal. Right vetricular wall thickness was not assessed. Right ventricular systolic function moderate to severely reduced. Left Atrium: Left atrial size was moderately dilated. Right Atrium: Right atrial size was moderately dilated. Pericardium: There is no evidence of pericardial effusion. Mitral Valve: The mitral valve is normal in structure. Moderate to severe mitral valve regurgitation. Tricuspid Valve: The tricuspid valve is normal in structure. Tricuspid valve regurgitation is  moderate. Aortic Valve: The aortic valve is tricuspid. Aortic valve regurgitation is moderate to severe. Aortic regurgitation PHT measures 379 msec. Mild aortic valve sclerosis is present, with no evidence of aortic valve stenosis. Pulmonic Valve: The pulmonic valve was not well visualized. Pulmonic valve regurgitation is trivial. Aorta: The aortic root is normal in size and structure. Venous: The inferior vena cava is dilated in size with less than 50% respiratory variability, suggesting right atrial pressure of 15 mmHg. IAS/Shunts: No atrial level shunt detected by color flow Doppler.  LEFT VENTRICLE PLAX 2D LVIDd:         6.90 cm  Diastology LVIDs:         6.50 cm  LV e' medial:    3.70 cm/s LV PW:         1.00 cm  LV E/e' medial:  28.6 LV IVS:        1.10 cm  LV e' lateral:   8.70 cm/s LVOT diam:     1.90 cm  LV E/e' lateral: 12.2 LVOT Area:     2.84 cm  RIGHT VENTRICLE            IVC RV Basal diam:  3.50 cm    IVC diam: 2.00 cm RV S prime:     7.83 cm/s TAPSE (M-mode): 1.3 cm LEFT ATRIUM              Index       RIGHT ATRIUM           Index LA diam:        4.30 cm  2.56 cm/m  RA Area:     21.30  cm LA Vol (A2C):   108.0 ml 64.29 ml/m RA Volume:   69.10 ml  41.13 ml/m LA Vol (A4C):   53.3 ml  31.73 ml/m LA Biplane Vol: 75.4 ml  44.89 ml/m  AORTIC VALVE AI PHT:      379 msec  AORTA Ao Root diam: 3.10 cm MITRAL VALVE                TRICUSPID VALVE MV Area (PHT): 5.97 cm     TR Peak grad:   47.1 mmHg MV Decel Time: 127 msec     TR Vmax:        343.00 cm/s MR Peak grad: 73.3 mmHg MR Mean grad: 48.0 mmHg     SHUNTS MR Vmax:      428.00 cm/s   Systemic Diam: 1.90 cm MR Vmean:     326.0 cm/s MV E velocity: 106.00 cm/s MV A velocity: 96.00 cm/s MV E/A ratio:  1.10 Dietrich Pates MD Electronically signed by Dietrich Pates MD Signature Date/Time: 12/24/2020/1:00:43 PM    Final      Labs:   Basic Metabolic Panel: Recent Labs  Lab 12/23/20 1753 12/24/20 0408 12/25/20 0326 12/26/20 0541 12/27/20 0307  NA 137 138 137 134* 133*  K 2.7* 3.7 3.2* 3.7 4.5  CL 102 105 101 101 100  CO2 24 24 28 26 27   GLUCOSE 81 80 90 98 97  BUN 9 7* 7* 8 8  CREATININE 0.66 0.66 0.80 0.91 1.06  CALCIUM 8.2* 8.2* 8.2* 8.3* 8.3*  MG 1.8  --  1.3* 2.0 1.9  PHOS 2.6  --   --   --   --    GFR Estimated Creatinine Clearance: 53.7 mL/min (by C-G formula based on SCr of 1.06 mg/dL). Liver Function Tests: Recent Labs  Lab 12/23/20 1217 12/23/20 1753 12/24/20  0408  AST 25 29 29   ALT 10 12 12   ALKPHOS 81 77 69  BILITOT 1.4* 1.4* 1.9*  PROT 7.2 7.8 6.5  ALBUMIN 3.3* 3.4* 2.8*   No results for input(s): LIPASE, AMYLASE in the last 168 hours. No results for input(s): AMMONIA in the last 168 hours. Coagulation profile Recent Labs  Lab 12/23/20 1753  INR 1.3*    CBC: Recent Labs  Lab 12/23/20 1217 12/23/20 1753 12/24/20 0408 12/27/20 0307  WBC 4.7 5.1 3.5* 15.7*  NEUTROABS 3.2 3.4  --   --   HGB 10.8* 11.2* 10.1* 11.0*  HCT 33.2* 33.2* 30.9* 33.4*  MCV 94.1 91.2 92.8 93.3  PLT 238.0 263 248 267   Cardiac Enzymes: No results for input(s): CKTOTAL, CKMB, CKMBINDEX, TROPONINI in the last 168  hours. BNP: Invalid input(s): POCBNP CBG: No results for input(s): GLUCAP in the last 168 hours. D-Dimer No results for input(s): DDIMER in the last 72 hours. Hgb A1c No results for input(s): HGBA1C in the last 72 hours. Lipid Profile No results for input(s): CHOL, HDL, LDLCALC, TRIG, CHOLHDL, LDLDIRECT in the last 72 hours. Thyroid function studies No results for input(s): TSH, T4TOTAL, T3FREE, THYROIDAB in the last 72 hours.  Invalid input(s): FREET3 Anemia work up Recent Labs    12/26/20 0541  FERRITIN 53  TIBC 344  IRON 24*   Microbiology Recent Results (from the past 240 hour(s))  Resp Panel by RT-PCR (Flu A&B, Covid) Nasopharyngeal Swab     Status: Abnormal   Collection Time: 12/23/20  6:15 PM   Specimen: Nasopharyngeal Swab; Nasopharyngeal(NP) swabs in vial transport medium  Result Value Ref Range Status   SARS Coronavirus 2 by RT PCR POSITIVE (A) NEGATIVE Final    Comment: RESULT CALLED TO, READ BACK BY AND VERIFIED WITH:  COBLE,S RN @ 1902 12/23/20 EDENSCA (NOTE) SARS-CoV-2 target nucleic acids are DETECTED.  The SARS-CoV-2 RNA is generally detectable in upper respiratory specimens during the acute phase of infection. Positive results are indicative of the presence of the identified virus, but do not rule out bacterial infection or co-infection with other pathogens not detected by the test. Clinical correlation with patient history and other diagnostic information is necessary to determine patient infection status. The expected result is Negative.  Fact Sheet for Patients: BloggerCourse.com  Fact Sheet for Healthcare Providers: SeriousBroker.it  This test is not yet approved or cleared by the [place] FDA and  has been authorized for detection and/or diagnosis of SARS-CoV-2 by FDA under an Emergency Use Authorization (EUA).  This EUA will remain in effect (meaning this test can be  used) for the duration  of  the COVID-19 declaration under Section 564(b)(1) of the Act, 21 U.S.C. section 360bbb-3(b)(1), unless the authorization is terminated or revoked sooner.     Influenza A by PCR NEGATIVE NEGATIVE Final   Influenza B by PCR NEGATIVE NEGATIVE Final    Comment: (NOTE) The Xpert Xpress SARS-CoV-2/FLU/RSV plus assay is intended as an aid in the diagnosis of influenza from Nasopharyngeal swab specimens and should not be used as a sole basis for treatment. Nasal washings and aspirates are unacceptable for Xpert Xpress SARS-CoV-2/FLU/RSV testing.  Fact Sheet for Patients: BloggerCourse.com  Fact Sheet for Healthcare Providers: SeriousBroker.it  This test is not yet approved or cleared by the [place] FDA and has been authorized for detection and/or diagnosis of SARS-CoV-2 by FDA under an Emergency Use Authorization (EUA). This EUA will remain in effect (meaning this test can be used)  for the duration of the COVID-19 declaration under Section 564(b)(1) of the Act, 21 U.S.C. section 360bbb-3(b)(1), unless the authorization is terminated or revoked.  Performed at [facility], [address] [place], [place] [address]      Discharge Instructions:   Discharge Instructions     (HEART FAILURE PATIENTS) Call MD:  Anytime you have any of the following symptoms: 1) 3 pound weight gain in 24 hours or 5 pounds in 1 week 2) shortness of breath, with or without a dry hacking cough 3) swelling in the hands, feet or stomach 4) if you have to sleep on extra pillows at night in order to breathe.   Complete by: As directed    AMB referral to CHF clinic   Complete by: As directed    Call MD for:  difficulty breathing, headache or visual disturbances   Complete by: As directed    Call MD for:  extreme fatigue   Complete by: As directed    Call MD for:  persistant dizziness or light-headedness   Complete by: As directed    Diet -  low sodium heart healthy   Complete by: As directed    Heart Failure patients record your daily weight using the same scale at the same time of day   Complete by: As directed    Increase activity slowly   Complete by: As directed    STOP any activity that causes chest pain, shortness of breath, dizziness, sweating, or exessive weakness   Complete by: As directed    Schedule appointment   Complete by: As directed    Follow up with PCP in 1 week      Allergies as of 12/27/2020   No Known Allergies      Medication List     STOP taking these medications    carvedilol 3.125 MG tablet Commonly known as: COREG   Entresto 49-51 MG Generic drug: sacubitril-valsartan       TAKE these medications    aspirin EC 81 MG tablet Take 81 mg by mouth daily.   atorvastatin 40 MG tablet Commonly known as: LIPITOR TAKE ONE TABLET BY MOUTH EVERYDAY TABLET 6PM FOR CHOLESTORL   digoxin 0.125 MG tablet Commonly known as: LANOXIN Take 1 tablet (0.125 mg total) by mouth daily. Start taking on: December 28, 2020   Farxiga 10 MG Tabs tablet Generic drug: dapagliflozin propanediol TAKE ONE TABLET BY MOUTH DAILY IN THE MORNING BEFORE BREAKFAST   ferrous sulfate 325 (65 FE) MG tablet Commonly known as: FeroSul Take 1 tablet (325 mg total) by mouth daily with breakfast. What changed:  medication strength how much to take when to take this   furosemide 40 MG tablet Commonly known as: LASIX Take 1 tablet (40 mg total) by mouth daily.   potassium chloride SA 20 MEQ tablet Commonly known as: KLOR-CON Take 3 tablets (60 mEq total) by mouth 2 (two) times daily.   sertraline 25 MG tablet Commonly known as: Zoloft Take 1 tablet (25 mg total) by mouth daily.   spironolactone 25 MG tablet Commonly known as: ALDACTONE TAKE ONE TABLET BY MOUTH AT BEDTIME        Follow-up Information      [facility] Follow up on 01/03/2021.   Specialty:  Cardiology Why: Advanced [facility] Failure [facility] at [facility] 11:30 am Entrance C Garage Code [id number] Contact information: [address] [id number] mc [place] [address] [phone]  Time coordinating discharge: 33 minutes  Signed:  Christa Fasig  [facility] 12/27/2020, 12:10 PM   Pager on [email or web address]. If 7PM-7AM, please contact night-coverage at [email or web address]

## 2020-12-28 ENCOUNTER — Telehealth: Payer: Self-pay

## 2020-12-28 NOTE — Progress Notes (Signed)
See phone message

## 2020-12-28 NOTE — Telephone Encounter (Signed)
Appointment length changed to 40

## 2020-12-28 NOTE — Telephone Encounter (Signed)
Transition Care Management Follow-up Telephone Call Date of discharge and from where: 12/27/2020-[facility] How have you been since you were released from the hospital? Doing well Any questions or concerns? No  Items Reviewed: Did the pt receive and understand the discharge instructions provided? Yes  Medications obtained and verified? No  Other? Yes  Any new allergies since your discharge? No  Dietary orders reviewed? Yes Do you have support at home? Yes   Home Care and Equipment/Supplies: Were home health services ordered? no If so, what is the name of the agency? N/a  Has the agency set up a time to come to the patient's home? not applicable Were any new equipment or medical supplies ordered?  No What is the name of the medical supply agency? N/a Were you able to get the supplies/equipment? not applicable Do you have any questions related to the use of the equipment or supplies? N/a  Functional Questionnaire: (I = Independent and D = Dependent) ADLs: I  Bathing/Dressing- I  Meal Prep- I  Eating- II  Maintaining continence- I  Transferring/Ambulation- I  Managing Meds- I WITH ASSISTANCE  Follow up appointments reviewed:  PCP Hospital f/u appt confirmed? Yes  Scheduled to see [name] on 01/09/2021 @ 10:40. Specialist Hospital f/u appt confirmed? Yes  Scheduled to see [facility] on 01/03/2021 @ 11:30. Are transportation arrangements needed? No  If their condition worsens, is the pt aware to call PCP or go to the Emergency Dept.? Yes Was the patient provided with contact information for the PCP's office or ED? Yes Was to pt encouraged to call back with questions or concerns? Yes

## 2020-12-29 ENCOUNTER — Other Ambulatory Visit (HOSPITAL_COMMUNITY): Payer: Self-pay

## 2020-12-29 ENCOUNTER — Telehealth (HOSPITAL_COMMUNITY): Payer: Self-pay

## 2020-12-29 NOTE — Progress Notes (Signed)
Paramedicine Encounter    Patient ID: Jeremy Powers, male    DOB: 01/14/1956, 64 y.o.   MRN: [id number]  Met with Jeremy Powers today in the home to review medications. Jeremy Powers has been receiving bubble packs from [facility] however he has been non compliant with same and was recently admitted and had some med changes following the admission. I filled one pill box using the bubble pack medications verifying what he is now taking and discarding the medications he was taken off of. Pill box explained to Jeremy Powers and he understood how to take same. Jeremy Powers, Jeremy Powers girlfriend also understood and said she would assist in helping him remember to take his medicines.   I verified appointment for Tuesday in clinic where I will meet him. He agreed with plan.   I will take bubble packs to [facility] and have them move back to pill bottles for Jeremy Powers until we can get him back on a stable medication regimen with decreased admissions. Jeremy Powers agreed with plan.   Visit complete. I will see him in clinic on Tuesday.   ACTION: Home visit completed       

## 2020-12-29 NOTE — Telephone Encounter (Signed)
Jeremy Powers and he agreed to paramedicine home visit today at 1130. Call complete.

## 2021-01-02 NOTE — Progress Notes (Addendum)
Advanced Heart Failure Clinic Note  Date:  01/03/2021   PCP:  [name], PA-C  Cardiologist:  None Primary HF: Dr. [name]  Chief Complaint: HF Follow up   History of Present Illness: Jeremy Powers is a 65 y.o. male CAD s/p PCI to LAD in 2015 and 2016, s/p CABG 2017, systolic HF EF 20-25%, microcytic/iron deficiency anemia followed by hematology and GI, LBBB, and tobacco use. Illiterate and cannot read.   In 2/20 he presented to [facility] ED with SOB, cough, and orthopnea in setting of med non-compliance. Was unable to afford copays with Medicaid. He was tachycardic and tachypneic on arrival, and was transferred to [facility] 06/22/18 for further evaluation. Echo performed which showed newly decreased EF of 25-30% from 50-55% in 12/2017.  Taken for [facility] which showed severe 1 v disease with occluded LAD within previous stent. RCA and LCX normal. LIMA to LAD and SVG to D2 patent. Cath also showed low filling pressures with moderately low cardiac output. Meds adjusted as tolerated.   OV 04/05/20 returns for HF follow up after not being seen in > 1 year. Lives with his girlfriend. Followed by HF Paramedicine. Lasix was stopped & [name] was started.  Echo 2/22 LVEF 20-25%, RV ok.    3/22 he was discharged from community paramedicine program and bubble pill packs arranged.   Seen in the ED 10/25/20 for CHF exacerbation in the setting of medication non-compliance. He was diuresed with IV lasix and discharged home.  Sent to the ED 12/23/20 by his PCP and found to be A/C CHF and COVID +. Repeat echo showed EF 10-15%, severe global hypokinesis with septal akinesis, LV severely dilated, grade II DD, RV fxn moderate to severely reduced, moderate to severe MR, moderate to severe AI. Diuresed 13 lbs, beta blocker stopped with acute decompensation, digoxin added and Entresto held with low BP with plans to restart as able.  Today he returns for post hospitalization HF follow up here with Paramedicine. Gets  around OK at home walking on flat surface. Denies CP, dizziness, edema, or PND/Orthopnea. Appetite ok, eating 2 meals a day. No fever or chills, no COVID symptoms. Weights consistent with Paramedicine. Taking all medications.   Echo 8/22: EF 10-15% Echo 06/2920: EF 20-25%, RV ok  Echo 01/2019: EF 20-25%  Echo 06/2018: EF 20-25%  cMRI 06/27/18: EF 24%. Normal RV   RHC/LHC 06/24/2018  RA = 1 RV = 19/3 PA = 24/5 (14) PCW = 7 Fick cardiac output/index = 4.0/2.5 PVR = 1.9 WU Ao sat = 95% PA sat = 68%, 71%   Assessment: 1. Severe 1-vessel CAD with occluded LAD within previous stent 2. Normal RCA and LCX 3. LIMA to LAD widely patent 4. SVG - D2 widely patent 5. SVG - D1 likely occluded (grafts not marked)  ROS: All systems negative except as listed in HPI, PMH and Problem List.  Past Medical History:  Diagnosis Date   CHF (congestive heart failure) (HCC)    Cirrhosis (HCC)    Coronary artery disease    Hypertension    Pancreatitis    Past Surgical History:  Procedure Laterality Date   CARDIAC SURGERY     RIGHT/LEFT HEART CATH AND CORONARY/GRAFT ANGIOGRAPHY N/A 06/24/2018   Procedure: RIGHT/LEFT HEART CATH AND CORONARY/GRAFT ANGIOGRAPHY;  Surgeon: Dolores Patty, MD;  Location: [facility] INVASIVE CV LAB;  Service: Cardiovascular;  Laterality: N/A;   Current Outpatient Medications  Medication Sig Dispense Refill   aspirin EC 81 MG  tablet Take 81 mg by mouth daily.     atorvastatin (LIPITOR) 40 MG tablet TAKE ONE TABLET BY MOUTH EVERYDAY TABLET 6PM FOR CHOLESTORL 30 tablet 1   digoxin (LANOXIN) 0.125 MG tablet Take 1 tablet (0.125 mg total) by mouth daily. 30 tablet 0   FARXIGA 10 MG TABS tablet TAKE ONE TABLET BY MOUTH DAILY IN THE MORNING BEFORE BREAKFAST 30 tablet 1   ferrous sulfate (FEROSUL) 325 (65 FE) MG tablet Take 1 tablet (325 mg total) by mouth daily with breakfast.     furosemide (LASIX) 40 MG tablet Take 1 tablet (40 mg total) by mouth daily. 30 tablet 0   potassium chloride  SA (KLOR-CON) 20 MEQ tablet Take 3 tablets (60 mEq total) by mouth 2 (two) times daily. 180 tablet 11   sertraline (ZOLOFT) 25 MG tablet Take 1 tablet (25 mg total) by mouth daily. 30 tablet 5   spironolactone (ALDACTONE) 25 MG tablet TAKE ONE TABLET BY MOUTH AT BEDTIME 30 tablet 10   No current facility-administered medications for this encounter.   Allergies:   Patient has no known allergies.   Social History:  The patient  reports that he has quit smoking. His smoking use included cigarettes. He smoked an average of .5 packs per day. He has never used smokeless tobacco. He reports current alcohol use. He reports current drug use. Drug: Marijuana.   Family History:  The patient's family history is not on file.   ROS:  Please see the history of present illness.   All other systems are personally reviewed and negative.   Recent Labs: 12/23/2020: B Natriuretic Peptide >4,500.0; Pro B Natriuretic peptide (BNP) >4833.0 12/24/2020: ALT 12 12/27/2020: BUN 8; Creatinine, Ser 1.06; Hemoglobin 11.0; Magnesium 1.9; Platelets 267; Potassium 4.5; Sodium 133  Personally reviewed   BP 120/69   Pulse 86   Wt 50 kg (110 lb 3.2 oz)   SpO2 97%   BMI 17.79 kg/m   Wt Readings from Last 3 Encounters:  01/03/21 50 kg (110 lb 3.2 oz)  12/27/20 55.4 kg (122 lb 3 oz)  12/23/20 61.2 kg (135 lb)   Physical Exam: General:  NAD. No resp difficulty, thin HEENT: Normal Neck: Supple. JVP 5-6. Carotids 2+ bilat; no bruits. No lymphadenopathy or thryomegaly appreciated. Cor: PMI nondisplaced. Regular rate & rhythm. No rubs, gallops or murmurs. Lungs: Clear Abdomen: Soft, nontender, nondistended. No hepatosplenomegaly. No bruits or masses. Good bowel sounds. Extremities: No cyanosis, clubbing, rash, edema Neuro: Alert & oriented x 3, cranial nerves grossly intact. Moves all 4 extremities w/o difficulty. Affect pleasant.  ECG: SR w/ LBBB qrs 184 ms (personally reviewed).  ASSESSMENT AND PLAN: 1.  Chronic  biventricular heart failure, NICM - Echo 06/2018 EF 25-30%,  cMRI 06/27/2018 with EF 24%, Echo 01/2019 EF 20-25% - Echo 07/15/20 EF 20-25% RV ok - Echo 8/22 with EF 10-15%, moderately to severely reduced RV function, moderate to severe MR, moderate to severe AI. - Previously referred to EP for consideration of CRT-D with wide LBBB but didn't follow up. Likely too advanced  - NYHA II, volume good.  - Start Entresto 24/26 mg bid.  - Change lasix to prn. - Continue Farxiga 10 mg daily. - Continue Digoxin 0.125 daily. - Continue Spiro 25 daily. - Hold off on beta blocker for now with recent decompensation. - He is not a candidate for advanced therapies. - BMET and dig level today; repeat BMET in 10 days.   2. CAD - S/P CABG 2017, LHC  06/2018  with occluded LAD normal LCX and RCA. LIMA to LAD and SVG to D2 patent (SVG to D1 occluded) - No s/s angina.  - Continue aspirin 81 mg daily + atorvastatin.      3. LBBB - Previously referred to EP for consideration of CRT-D with wide LBBB but didn't follow up. Likely too advanced currently to benefit.   4. Valvular heart disease - Moderate to severe MR and moderate to severe AI on echo 8/22. LVIDd 6.9 cm -Trivial MR and mild AI on echo 2/22.   5.  Former Tobacco Use - No longer smoking.  - Lives with people who do smoke.   6. HTN - Stable today.   7. Anemia - Iron deficient. - CBC today.  8 COVID infection - Resolved. - Encouraged vaccination   9. Illiterate - Cannot read. Able to write numbers. Needs assistance with medications.  Appreciate Paramedicine's assistance.   Follow up in 3 weeks with PharmD for further medication titration and in 6 weeks with APP.  Cydney Ok, FNP  01/03/2021 11:33 AM  [facility] [address] Heart and Vascular [place] [address] [phone] (office) [phone] (fax)

## 2021-01-03 ENCOUNTER — Other Ambulatory Visit (HOSPITAL_COMMUNITY): Payer: Self-pay

## 2021-01-03 ENCOUNTER — Ambulatory Visit (HOSPITAL_COMMUNITY)
Admission: RE | Admit: 2021-01-03 | Discharge: 2021-01-03 | Disposition: A | Discharge status: Home / Self Care | Payer: Medicare Other | Source: Ambulatory Visit | Attending: Family Medicine | Admitting: Family Medicine

## 2021-01-03 ENCOUNTER — Other Ambulatory Visit: Payer: Self-pay

## 2021-01-03 ENCOUNTER — Encounter (HOSPITAL_COMMUNITY): Payer: Self-pay

## 2021-01-03 VITALS — BP 120/69 | HR 86 | Wt 110.2 lb

## 2021-01-03 DIAGNOSIS — F1290 Cannabis use, unspecified, uncomplicated: Secondary | ICD-10-CM | POA: Diagnosis not present

## 2021-01-03 DIAGNOSIS — Z7984 Long term (current) use of oral hypoglycemic drugs: Secondary | ICD-10-CM | POA: Insufficient documentation

## 2021-01-03 DIAGNOSIS — I110 Hypertensive heart disease with heart failure: Secondary | ICD-10-CM | POA: Insufficient documentation

## 2021-01-03 DIAGNOSIS — I2510 Atherosclerotic heart disease of native coronary artery without angina pectoris: Secondary | ICD-10-CM | POA: Insufficient documentation

## 2021-01-03 DIAGNOSIS — D509 Iron deficiency anemia, unspecified: Secondary | ICD-10-CM | POA: Diagnosis not present

## 2021-01-03 DIAGNOSIS — I340 Nonrheumatic mitral (valve) insufficiency: Secondary | ICD-10-CM

## 2021-01-03 DIAGNOSIS — I38 Endocarditis, valve unspecified: Secondary | ICD-10-CM | POA: Insufficient documentation

## 2021-01-03 DIAGNOSIS — I5022 Chronic systolic (congestive) heart failure: Secondary | ICD-10-CM

## 2021-01-03 DIAGNOSIS — I351 Nonrheumatic aortic (valve) insufficiency: Secondary | ICD-10-CM

## 2021-01-03 DIAGNOSIS — Z951 Presence of aortocoronary bypass graft: Secondary | ICD-10-CM | POA: Diagnosis not present

## 2021-01-03 DIAGNOSIS — Z550 Illiteracy and low-level literacy: Secondary | ICD-10-CM | POA: Diagnosis not present

## 2021-01-03 DIAGNOSIS — I10 Essential (primary) hypertension: Secondary | ICD-10-CM

## 2021-01-03 DIAGNOSIS — Z7982 Long term (current) use of aspirin: Secondary | ICD-10-CM | POA: Insufficient documentation

## 2021-01-03 DIAGNOSIS — I447 Left bundle-branch block, unspecified: Secondary | ICD-10-CM | POA: Insufficient documentation

## 2021-01-03 DIAGNOSIS — Z87891 Personal history of nicotine dependence: Secondary | ICD-10-CM

## 2021-01-03 DIAGNOSIS — I5023 Acute on chronic systolic (congestive) heart failure: Secondary | ICD-10-CM | POA: Diagnosis not present

## 2021-01-03 DIAGNOSIS — Z79899 Other long term (current) drug therapy: Secondary | ICD-10-CM | POA: Diagnosis not present

## 2021-01-03 DIAGNOSIS — Z955 Presence of coronary angioplasty implant and graft: Secondary | ICD-10-CM | POA: Diagnosis not present

## 2021-01-03 DIAGNOSIS — U071 COVID-19: Secondary | ICD-10-CM | POA: Diagnosis not present

## 2021-01-03 DIAGNOSIS — I428 Other cardiomyopathies: Secondary | ICD-10-CM | POA: Insufficient documentation

## 2021-01-03 DIAGNOSIS — I5082 Biventricular heart failure: Secondary | ICD-10-CM | POA: Diagnosis not present

## 2021-01-03 DIAGNOSIS — Z8616 Personal history of COVID-19: Secondary | ICD-10-CM

## 2021-01-03 LAB — BASIC METABOLIC PANEL
Anion gap: 11 (ref 5–15)
BUN: 13 mg/dL (ref 8–23)
CO2: 23 mmol/L (ref 22–32)
Calcium: 9.8 mg/dL (ref 8.9–10.3)
Chloride: 103 mmol/L (ref 98–111)
Creatinine, Ser: 1.08 mg/dL (ref 0.61–1.24)
GFR, Estimated: >60 mL/min (ref >60)
Glucose, Bld: 106 mg/dL — ABNORMAL HIGH (ref 70–99)
Potassium: 4.8 mmol/L (ref 3.5–5.1)
Sodium: 137 mmol/L (ref 135–145)

## 2021-01-03 LAB — CBC
HCT: 43.3 % (ref 39.0–52.0)
Hemoglobin: 14 g/dL (ref 13.0–17.0)
MCH: 30.7 pg (ref 26.0–34.0)
MCHC: 32.3 g/dL (ref 30.0–36.0)
MCV: 95 fL (ref 80.0–100.0)
Platelets: 430 10*3/uL — ABNORMAL HIGH (ref 150–400)
RBC: 4.56 MIL/uL (ref 4.22–5.81)
RDW: 22.2 % — ABNORMAL HIGH (ref 11.5–15.5)
WBC: 5.1 10*3/uL (ref 4.0–10.5)
nRBC: 0 % (ref 0.0–0.2)

## 2021-01-03 LAB — DIGOXIN LEVEL: Digoxin Level: 0.4 ng/mL — ABNORMAL LOW (ref 0.8–2.0)

## 2021-01-03 MED ORDER — ENTRESTO 24-26 MG PO TABS: 1.0000 | ORAL_TABLET | Freq: Two times a day (BID) | ORAL | 4 refills | Status: DC

## 2021-01-03 MED ORDER — FUROSEMIDE 40 MG PO TABS: 40.0000 mg | ORAL_TABLET | ORAL | 0 refills | Status: DC | PRN

## 2021-01-03 NOTE — Patient Instructions (Signed)
Lab work was performed today, if labs are abnormal the clinic will call you  Your physician recommends that you return for lab work in: 10 days  START Entresto 24/26 mg (1 tablet) twice daily  CHANGE Lasix to as needed  Your physician recommends that you schedule a follow-up appointment in: 6 weeks  milAt the [facility], you and your health needs are our priority. As part of our continuing mission to provide you with exceptional heart care, we have created designated Provider Care Teams. These Care Teams include your primary Cardiologist (physician) and Advanced Practice Providers (APPs- Physician Assistants and Nurse Practitioners) who all work together to provide you with the care you need, when you need it.   You may see any of the following providers on your designated Care Team at your next follow up: Dr Arvilla Meres Dr Marca Ancona Dr Brandon Melnick, NP Robbie Lis, [place] Mikki Santee Karle Plumber, PharmD   Please be sure to bring in all your medications bottles to every appointment.    If you have any questions or concerns before your next appointment please send [place] a message through [place] or call our office at [phone].    TO LEAVE A MESSAGE FOR THE NURSE SELECT OPTION 2, PLEASE LEAVE A MESSAGE INCLUDING: YOUR NAME DATE OF BIRTH CALL BACK NUMBER REASON FOR CALL**this is important as we prioritize the call backs  YOU WILL RECEIVE A CALL BACK THE SAME DAY AS LONG AS YOU CALL BEFORE 4:00 PM

## 2021-01-03 NOTE — Progress Notes (Signed)
Paramedicine Encounter    Patient ID: LEELAN RAJEWSKI, male    DOB: 07/19/1955, 65 y.o.   MRN: [id number]  Met with Leopold in clinic today where he reports feeling okay. Alicia had no complaints on assessment other than his normal increased work of breathing while carrying things or going upstairs. Moyses was more complaint with his medications over the last week. He took all but one morning dose and two evening doses. I obtained medications from [facility] and verified same with clinic. Labs and EKG were obtained today.   Janett Billow made the following medication changes.:  -lasix as needed -entresto 24-26 BID   LABS IN 10 DAYS F/U WITH PHARMACY CLINIC IN 3 WEEKS  I will see Lenardo in one week for follow up visit in the home. Tyjay was reminded and appointment note wrote down for his PCP appointment on Monday.   (Samples provided of Entresto 24-26, I will pick up RX from [facility] next week.)  ACTION: Home visit completed

## 2021-01-04 ENCOUNTER — Encounter (HOSPITAL_COMMUNITY): Payer: Self-pay | Admitting: Adult Health

## 2021-01-04 NOTE — Progress Notes (Unsigned)
HF Paramedicine Team Based Care Meeting  HF MD- NA  HF NP - Odella Appelhans NP-C   [facility] HF Paramedicine  Zack Renae Gloss  Katie Lynch [facility] admit within the last 30 days for heart failure? No   Medications concerns? Misses meds a couple time a week.   Transportation issues ? Has [facility].   Education needs? No needs.   SDOH - Lives with girlfriend. No issues.   Eligible for discharge?  Not yet until HF meds stabilized.    Essie Gehret NP-C  3:29 PM

## 2021-01-05 ENCOUNTER — Other Ambulatory Visit (HOSPITAL_COMMUNITY): Payer: Self-pay | Admitting: Internal Medicine

## 2021-01-09 ENCOUNTER — Encounter (HOSPITAL_COMMUNITY): Payer: Medicare Other

## 2021-01-09 ENCOUNTER — Other Ambulatory Visit (HOSPITAL_COMMUNITY): Payer: Self-pay | Admitting: Family Medicine

## 2021-01-09 ENCOUNTER — Ambulatory Visit: Payer: Medicare Other | Admitting: Medical

## 2021-01-10 ENCOUNTER — Telehealth (HOSPITAL_COMMUNITY): Payer: Self-pay

## 2021-01-10 NOTE — Telephone Encounter (Signed)
I called Ms. Whitfield after being unable to reach Mr. Mula. I advised that Herbert Seta was out due to a family emergency and would not be able to see Mr. Worlds today. She stated that she would be able to help Mr. Tetrault fill his pillbox. I advised that if she had questions she could cal me directly but otherwise just to refer to his bottles for instructions. She was understanding and agreeable. Herbert Seta will follow up next week.   Jacqualine Code, EMT 01/10/21

## 2021-01-13 ENCOUNTER — Ambulatory Visit (HOSPITAL_COMMUNITY)
Admission: RE | Admit: 2021-01-13 | Discharge: 2021-01-13 | Disposition: A | Discharge status: Home / Self Care | Payer: Medicare Other | Source: Ambulatory Visit | Attending: Cardiology | Admitting: Cardiology

## 2021-01-13 ENCOUNTER — Other Ambulatory Visit: Payer: Self-pay

## 2021-01-13 DIAGNOSIS — I5022 Chronic systolic (congestive) heart failure: Secondary | ICD-10-CM | POA: Diagnosis not present

## 2021-01-13 LAB — BASIC METABOLIC PANEL
Anion gap: 7 (ref 5–15)
BUN: 5 mg/dL — ABNORMAL LOW (ref 8–23)
CO2: 23 mmol/L (ref 22–32)
Calcium: 8.6 mg/dL — ABNORMAL LOW (ref 8.9–10.3)
Chloride: 108 mmol/L (ref 98–111)
Creatinine, Ser: 0.72 mg/dL (ref 0.61–1.24)
GFR, Estimated: >60 mL/min (ref >60)
Glucose, Bld: 87 mg/dL (ref 70–99)
Potassium: 3.9 mmol/L (ref 3.5–5.1)
Sodium: 138 mmol/L (ref 135–145)

## 2021-01-13 LAB — BRAIN NATRIURETIC PEPTIDE: B Natriuretic Peptide: 2527.5 pg/mL — ABNORMAL HIGH (ref 0.0–100.0)

## 2021-01-19 ENCOUNTER — Other Ambulatory Visit (HOSPITAL_COMMUNITY): Payer: Self-pay

## 2021-01-19 NOTE — Progress Notes (Signed)
Paramedicine Encounter    Patient ID: Jeremy Powers, male    DOB: 1955-12-25, 65 y.o.   MRN: [id number]   Patient Care Team: [name] as PCP - General (Internal Medicine)  Patient Active Problem List   Diagnosis Date Noted   Mitral regurgitation 12/26/2020   COVID-19 virus infection 12/24/2020   CHF exacerbation (HCC) 12/23/2020   Acute on chronic systolic HF (heart failure) (HCC) 06/24/2018   Aortic insufficiency 06/22/2018   Acute on chronic congestive heart failure (HCC) 06/22/2018   Elevated troponin 06/22/2018   CAD (coronary artery disease) 06/22/2018   Left bundle branch block 06/22/2018   History of iron deficiency anemia 06/22/2018   Acute on chronic heart failure (HCC) 06/22/2018   Chronic bilateral low back pain without sciatica 12/12/2015   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   Iron deficiency anemia 03/28/2015   Hyperlipidemia 10/12/2014    Current Outpatient Medications:    aspirin EC 81 MG tablet, Take 81 mg by mouth daily., Disp: , Rfl:    atorvastatin (LIPITOR) 40 MG tablet, TAKE ONE TABLET BY MOUTH EVERYDAY TABLET 6PM FOR CHOLESTEROL, Disp: 30 tablet, Rfl: 4   digoxin (LANOXIN) 0.125 MG tablet, TAKE ONE TABLET BY MOUTH DAILY, Disp: 30 tablet, Rfl: 0   FARXIGA 10 MG TABS tablet, TAKE ONE TABLET BY MOUTH DAILY IN THE MORNING BEFORE BREAKFAST, Disp: 30 tablet, Rfl: 4   ferrous sulfate (FEROSUL) 325 (65 FE) MG tablet, Take 1 tablet (325 mg total) by mouth daily with breakfast., Disp: , Rfl:    furosemide (LASIX) 40 MG tablet, Take 1 tablet (40 mg total) by mouth as needed., Disp: 30 tablet, Rfl: 0   potassium chloride SA (KLOR-CON) 20 MEQ tablet, Take 3 tablets (60 mEq total) by mouth 2 (two) times daily., Disp: 180 tablet, Rfl: 11   sacubitril-valsartan (ENTRESTO) 24-26 MG, Take 1 tablet by mouth 2 (two) times daily., Disp: 60 tablet, Rfl: 4   sertraline (ZOLOFT) 25 MG tablet, Take 1 tablet (25 mg total) by mouth daily., Disp: 30 tablet,  Rfl: 5   spironolactone (ALDACTONE) 25 MG tablet, TAKE ONE TABLET BY MOUTH AT BEDTIME, Disp: 30 tablet, Rfl: 10 No Known Allergies   Social History   Socioeconomic History   Marital status: Single    Spouse name: Not on file   Number of children: Not on file   Years of education: Not on file   Highest education level: Not on file  Occupational History   Not on file  Tobacco Use   Smoking status: Former    Packs/day: 0.50    Types: Cigarettes   Smokeless tobacco: Never  Vaping Use   Vaping Use: Never used  Substance and Sexual Activity   Alcohol use: Yes    Comment: daily   Drug use: Yes    Types: Marijuana   Sexual activity: Not on file  Other Topics Concern   Not on file  Social History Narrative   Not on file   Social Determinants of Health   Financial Resource Strain: Not on file  Food Insecurity: Not on file  Transportation Needs: No Transportation Needs   Lack of Transportation (Medical): No   Lack of Transportation (Non-Medical): No  Physical Activity: Not on file  Stress: Not on file  Social Connections: Not on file  Intimate Partner Violence: Not on file    Physical Exam Vitals reviewed.  Constitutional:      Appearance: Normal appearance. He is normal weight.  HENT:  Head: Normocephalic.     Nose: Nose normal.     Mouth/Throat:     Mouth: Mucous membranes are moist.     Pharynx: Oropharynx is clear.  Eyes:     Conjunctiva/sclera: Conjunctivae normal.     Pupils: Pupils are equal, round, and reactive to light.  Cardiovascular:     Rate and Rhythm: Normal rate and regular rhythm.     Pulses: Normal pulses.     Heart sounds: Normal heart sounds.  Pulmonary:     Effort: Pulmonary effort is normal.     Breath sounds: Normal breath sounds.  Abdominal:     General: Abdomen is flat.     Palpations: Abdomen is soft.  Musculoskeletal:        General: No swelling. Normal range of motion.     Cervical back: Normal range of motion.     Right  lower leg: No edema.     Left lower leg: No edema.  Skin:    General: Skin is warm and dry.     Capillary Refill: Capillary refill takes less than 2 seconds.  Neurological:     General: No focal deficit present.     Mental Status: He is alert. Mental status is at baseline.  Psychiatric:        Mood and Affect: Mood normal.    Arrived for home visit for Zaden who was alert and oriented reporting to be feeling great. Gay denied shortness of breath, dizziness, chest pain or trouble taking his medicines. His pill box was empty and his girlfriend [name] reports he has been taking his medicines daily.   Assessment obtained, lung sounds clear, no edema or swelling. Weight gain noted however Mont reports he is eating more often since having covid.   I reviewed medications and filled one weeks worth of pill box. I wrote on pill bottles where each medication goes in the box and reviewed same with [name] and her daughter [name] who will be filling the pill box next week with my absence.   Mouhamad reminded of upcoming appointments. I set up transportation for same.  Tuesday Sept 6th with PCP at 1000, RIDE PICK UP AT 0930 from home.   Ameer was given written confirmations.   Home visit complete. I will see Kade in clinic Sept 12th.    Future Appointments  Date Time Provider Department Center  01/30/2021  2:00 PM [facility] PHARMACY [facility] None  02/07/2021 11:00 AM [facility] PA/NP [facility] None     ACTION: Home visit completed

## 2021-01-24 ENCOUNTER — Ambulatory Visit: Payer: Medicare Other | Admitting: Medical

## 2021-01-25 ENCOUNTER — Ambulatory Visit: Payer: Medicare Other | Admitting: Medical

## 2021-01-30 ENCOUNTER — Other Ambulatory Visit: Payer: Self-pay

## 2021-01-30 ENCOUNTER — Ambulatory Visit (HOSPITAL_COMMUNITY)
Admission: RE | Admit: 2021-01-30 | Discharge: 2021-01-30 | Disposition: A | Discharge status: Home / Self Care | Payer: Medicare Other | Source: Ambulatory Visit | Attending: Internal Medicine | Admitting: Internal Medicine

## 2021-01-30 ENCOUNTER — Other Ambulatory Visit (HOSPITAL_COMMUNITY): Payer: Self-pay

## 2021-01-30 ENCOUNTER — Telehealth (HOSPITAL_COMMUNITY): Payer: Self-pay

## 2021-01-30 VITALS — BP 120/68 | HR 75 | Wt 128.6 lb

## 2021-01-30 DIAGNOSIS — I110 Hypertensive heart disease with heart failure: Secondary | ICD-10-CM | POA: Diagnosis not present

## 2021-01-30 DIAGNOSIS — I080 Rheumatic disorders of both mitral and aortic valves: Secondary | ICD-10-CM | POA: Insufficient documentation

## 2021-01-30 DIAGNOSIS — D509 Iron deficiency anemia, unspecified: Secondary | ICD-10-CM | POA: Insufficient documentation

## 2021-01-30 DIAGNOSIS — Z951 Presence of aortocoronary bypass graft: Secondary | ICD-10-CM | POA: Insufficient documentation

## 2021-01-30 DIAGNOSIS — Z87891 Personal history of nicotine dependence: Secondary | ICD-10-CM | POA: Insufficient documentation

## 2021-01-30 DIAGNOSIS — I5022 Chronic systolic (congestive) heart failure: Secondary | ICD-10-CM

## 2021-01-30 DIAGNOSIS — Z550 Illiteracy and low-level literacy: Secondary | ICD-10-CM | POA: Diagnosis not present

## 2021-01-30 DIAGNOSIS — I5082 Biventricular heart failure: Secondary | ICD-10-CM | POA: Diagnosis present

## 2021-01-30 DIAGNOSIS — Z7982 Long term (current) use of aspirin: Secondary | ICD-10-CM | POA: Diagnosis not present

## 2021-01-30 DIAGNOSIS — I2510 Atherosclerotic heart disease of native coronary artery without angina pectoris: Secondary | ICD-10-CM | POA: Diagnosis not present

## 2021-01-30 DIAGNOSIS — I428 Other cardiomyopathies: Secondary | ICD-10-CM | POA: Insufficient documentation

## 2021-01-30 DIAGNOSIS — Z8616 Personal history of COVID-19: Secondary | ICD-10-CM | POA: Diagnosis not present

## 2021-01-30 DIAGNOSIS — I447 Left bundle-branch block, unspecified: Secondary | ICD-10-CM | POA: Diagnosis not present

## 2021-01-30 MED ORDER — SACUBITRIL-VALSARTAN 49-51 MG PO TABS: 1.0000 | ORAL_TABLET | Freq: Two times a day (BID) | ORAL | 11 refills | Status: DC

## 2021-01-30 NOTE — Progress Notes (Signed)
Paramedicine Encounter    Patient ID: Jeremy Powers, male    DOB: [birth year], 65 y.o.   MRN: [id number]   Ellen Mayol in pharmacy clinic today where he was seen by student Maralyn Sago and pharmacist Leotis Shames.   [name] reports doing well with current medication regimen, he has been complaint with same as I am filling weekly pill boxes.   Entresto increased to 49-51mg    Pill box filled to reflect same.   -PCP appointment set up for Monday at 0940 with RIDE PICK UP AT 0836  -HF Clinic appointment set up for Tuesday at 1100 with RIDE PICK UP AT 1005.   Written reminder and phone call to [name] (girlfriend) to remind her of same. I will see [name] in clinic next Tuesday.     ACTION: Home visit completed

## 2021-01-30 NOTE — Patient Instructions (Signed)
It was a pleasure seeing you today!  MEDICATIONS: -We are changing your medications today -Increase Entresto to 49/51 mg (1 tablet) twice daily. You may take 2 tablets of the 24/26 mg strength twice daily until you receive the new strength.  -Call if you have questions about your medications.  LABS: -We will call you if your labs need attention.  NEXT APPOINTMENT: Return to clinic in 1 week with APP Clinic.  In general, to take care of your heart failure: -Limit your fluid intake to 2 Liters (half-gallon) per day.   -Limit your salt intake to ideally 2-3 grams (2000-3000 mg) per day. -Weigh yourself daily and record, and bring that "weight diary" to your next appointment.  (Weight gain of 2-3 pounds in 1 day typically means fluid weight.) -The medications for your heart are to help your heart and help you live longer.   -Please contact us before stopping any of your heart medications.  Call the clinic at [phone] with questions or to reschedule future appointments.

## 2021-01-30 NOTE — Progress Notes (Signed)
PCP:  [name], PA-C           Cardiologist:  None Primary HF: Dr. [name]  HPI:  Jeremy Powers is a 65 y.o. male with PMH of CAD s/p PCI to LAD in 2015 and 2016, s/p CABG 2017, systolic HF EF 20-25%, microcytic/iron deficiency anemia followed by hematology and GI, LBBB, and tobacco use. Illiterate and cannot read.    In 06/2018 he presented to [facility] ED with SOB, cough, and orthopnea in setting of med non-compliance. Was unable to afford copays with Medicaid. He was tachycardic and tachypneic on arrival, and was transferred to [facility] 06/22/18 for further evaluation. Echo performed which showed newly decreased EF of 25-30% from 50-55% in 12/2017. Taken for [facility] which showed severe 1 v disease with occluded LAD within previous stent. RCA and LCX normal. LIMA to LAD and SVG to D2 patent. Cath also showed low filling pressures with moderately low cardiac output. Meds adjusted as tolerated.    OV 04/05/20 returns for HF follow up after not being seen in > 1 year. Lives with his girlfriend. Followed by HF Paramedicine. Lasix was stopped & [name] was started.   Echo 06/2020 LVEF 20-25%, RV ok.     07/2020 he was discharged from community paramedicine program and bubble pill packs arranged.    Seen in the ED 10/25/20 for CHF exacerbation in the setting of medication non-compliance. He was diuresed with IV lasix and discharged home.   Sent to the ED 12/23/20 by his PCP and found to be A/C CHF and COVID +. Repeat echo showed EF 10-15%, severe global hypokinesis with septal akinesis, LV severely dilated, grade II DD, RV fxn moderate to severely reduced, moderate to severe MR, moderate to severe AI. Diuresed 13 lbs, beta blocker stopped with acute decompensation, digoxin added and Entresto held with low BP with plans to restart as able.   He returned to HF Clinic on 01/03/21 for post hospitalization HF follow up. Here with Paramedicine. Gets around OK at home walking on flat surface. Denied CP, dizziness, edema, or  PND/Orthopnea. Appetite was ok, eating 2 meals a day. No fever or chills, no COVID symptoms. Weights consistent with Paramedicine. Taking all medications.   Today he returns to HF clinic for pharmacist medication titration. At last visit with APP, Entresto 24/26 mg BID was started and furosemide was decreased to PRN only. Overall, feeling good. Denies dizziness, lightheadedness and fatigue. Reports no chest pain. States his breathing has been very good. Only gets SOB slightly. Able to walk to the dumpster ~1.5 blocks away. Weight at home stable ~127 lbs per Paramedicine. Takes furosemide 40 mg PRN and has not needed any doses. No LEE, PND, or orthopnea. Heather from paramedicine states patient is doing much better with medication compliance after switching back to pillbox method from bubble packs. Taking all medications as prescribed and tolerating them.  HF Medications: Entresto 24/26 mg BID Spironolactone 25 mg daily Farxiga 10 mg daily Digoxin 0.125 mg daily Furosemide 40 mg PRN  Has the patient been experiencing any side effects to the medications prescribed? No, patient is tolerating all his medications.  Does the patient have any problems obtaining medications due to transportation or finances? Pt has Medicare Part A and B and [place] Medicaid. Gets medications delivered to door from [employer].  Understanding of regimen: good Understanding of indications: good Potential of compliance: good Patient understands to avoid NSAIDs. Patient understands to avoid decongestants.  Pertinent Lab Values: 01/13/21: Serum creatinine  0.72, BUN 5, Potassium 3.9, Sodium 138, Digoxin (01/03/21) 0.4   Vital Signs: Weight: 128.6 (last clinic weight: 110.2 lbs) Blood pressure: 120/68  Heart rate: 75   Assessment/Plan: 1.  Chronic biventricular heart failure, NICM - Echo 06/2018 EF 25-30%, cMRI 06/27/2018 with EF 24%, Echo 01/2019 EF 20-25% - Echo 07/15/20 EF 20-25% RV ok - Echo 12/2020 with EF 10-15%,  moderately to severely reduced RV function, moderate to severe MR, moderate to severe AI. - Previously referred to EP for consideration of CRT-D with wide LBBB but did not follow up. Likely too advanced.  - NYHA II, euvolemic on exam. Not requiring any doses of PRN furosemide. - Continue furosemide 40 mg PRN - Increase Entresto to 49/51 mg BID. Repeat BMET in 1 week at HF clinic visit.  - Continue spironolactone 25 mg daily - Continue Farxiga 10 mg daily. - Continue Digoxin 0.125 mg daily. - Hold off on beta blocker for now with recent decompensation - He is not a candidate for advanced therapies.   2. CAD - S/P CABG 2017, LHC 06/2018  with occluded LAD normal LCX and RCA. LIMA to LAD and SVG to D2 patent (SVG to D1 occluded) - No s/sx angina.  - Continue aspirin 81 mg daily + atorvastatin 40 mg daily.      3. LBBB - Previously referred to EP for consideration of CRT-D with wide LBBB but did not follow up. Likely too advanced currently to benefit.   4. Valvular heart disease - Moderate to severe MR and moderate to severe AI on echo 12/2020. LVIDd 6.9 cm -Trivial MR and mild AI on echo 06/2020.   5.  Former Tobacco Use - No longer smoking.  - Lives with people who do smoke.   6. HTN - Stable today.   7. Anemia - Iron deficient. - CBC done 01/03/21.   8 COVID infection - Resolved. - Encouraged vaccination    9. Illiterate - Cannot read. Able to write numbers. Needs assistance with medications. Appreciate Paramedicine's assistance.  Follow-up on 02/07/21 with APP clinic.   Karle Plumber, PharmD, BCPS, BCCP, CPP Heart Failure Clinic Pharmacist [phone]

## 2021-01-30 NOTE — Telephone Encounter (Signed)
Spoke to Jeremy Powers and Mickie Bail (his daughter in law) about his appointment today for 2:00 with the pharmacy team at the HF clinic. He agreed, I set up [employer] for a 1:08 pick up from home, Karlton Lemon aware. I will meet Dyami at clinic at 2:00. Call complete.

## 2021-02-04 NOTE — Progress Notes (Signed)
Advanced Heart Failure Clinic Note  Date:  02/07/2021   PCP:  [name], PA-C  Cardiologist:  None Primary HF: Dr. [name]  Chief Complaint: HF Follow up   History of Present Illness: Jeremy Powers is a 65 y.o.male CAD s/p PCI to LAD in 2015 and 2016, s/p CABG 2017, systolic HF EF 20-25%, microcytic/iron deficiency anemia followed by hematology and GI, LBBB, and tobacco use. Illiterate and cannot read.   In 2/20 he presented to [facility] ED with SOB, cough, and orthopnea in setting of med non-compliance. Was unable to afford copays with Medicaid. He was tachycardic and tachypneic on arrival, and was transferred to [facility] 06/22/18 for further evaluation. Echo performed which showed newly decreased EF of 25-30% from 50-55% in 12/2017.  Taken for [facility] which showed severe 1 v disease with occluded LAD within previous stent. RCA and LCX normal. LIMA to LAD and SVG to D2 patent. Cath also showed low filling pressures with moderately low cardiac output. Meds adjusted as tolerated.   OV 04/05/20 returns for HF follow up after not being seen in > 1 year. Lives with his girlfriend. Followed by HF Paramedicine. Lasix was stopped & [name] was started.  Echo 2/22 LVEF 20-25%, RV ok.    3/22 he was discharged from community paramedicine program and bubble pill packs arranged.   Seen in the ED 10/25/20 for CHF exacerbation in the setting of medication non-compliance. He was diuresed with IV lasix and discharged home.  Sent to the ED 12/23/20 by his PCP and found to be A/C CHF and COVID +. Repeat echo showed EF 10-15%, severe global hypokinesis with septal akinesis, LV severely dilated, grade II DD, RV fxn moderate to severely reduced, moderate to severe MR, moderate to severe AI. Diuresed 13 lbs, beta blocker stopped with acute decompensation, digoxin added, and Entresto held with low BP with plans to restart as able.  Today he returns for HF follow up with paramedicine. He gets around at home OK without  much dyspnea. He is a little dizzy today. Saw PCP yesterday and starting on antibiotic for possible PNA. He has productive cough with yellow phlegm. Denies CP, edema, or PND/Orthopnea. Appetite ok, says he is eating a lot more. No fever or chills. Weight with paramedicine stable. Missed a couple days of meds last week.   Echo 8/22: EF 10-15% Echo 06/2920: EF 20-25%, RV ok  Echo 01/2019: EF 20-25%  Echo 06/2018: EF 20-25%  cMRI 06/27/18: EF 24%. Normal RV   RHC/LHC 06/24/2018  RA = 1 RV = 19/3 PA = 24/5 (14) PCW = 7 Fick cardiac output/index = 4.0/2.5 PVR = 1.9 WU Ao sat = 95% PA sat = 68%, 71%   Assessment: 1. Severe 1-vessel CAD with occluded LAD within previous stent 2. Normal RCA and LCX 3. LIMA to LAD widely patent 4. SVG - D2 widely patent 5. SVG - D1 likely occluded (grafts not marked)  ROS: All systems negative except as listed in HPI, PMH and Problem List.  Past Medical History:  Diagnosis Date   CHF (congestive heart failure) (HCC)    Cirrhosis (HCC)    Coronary artery disease    Hypertension    Pancreatitis    Past Surgical History:  Procedure Laterality Date   CARDIAC SURGERY     RIGHT/LEFT HEART CATH AND CORONARY/GRAFT ANGIOGRAPHY N/A 06/24/2018   Procedure: RIGHT/LEFT HEART CATH AND CORONARY/GRAFT ANGIOGRAPHY;  Surgeon: Dolores Patty, MD;  Location: [facility] INVASIVE CV LAB;  Service: Cardiovascular;  Laterality: N/A;   Current Outpatient Medications  Medication Sig Dispense Refill   aspirin EC 81 MG tablet Take 81 mg by mouth daily.     atorvastatin (LIPITOR) 40 MG tablet TAKE ONE TABLET BY MOUTH EVERYDAY TABLET 6PM FOR CHOLESTEROL 30 tablet 4   digoxin (LANOXIN) 0.125 MG tablet TAKE ONE TABLET BY MOUTH DAILY 30 tablet 0   FARXIGA 10 MG TABS tablet TAKE ONE TABLET BY MOUTH DAILY IN THE MORNING BEFORE BREAKFAST 30 tablet 4   ferrous sulfate (FEROSUL) 325 (65 FE) MG tablet Take 1 tablet (325 mg total) by mouth daily with breakfast.     furosemide (LASIX) 40 MG  tablet Take 1 tablet (40 mg total) by mouth as needed. 30 tablet 0   potassium chloride SA (KLOR-CON) 20 MEQ tablet Take 3 tablets (60 mEq total) by mouth 2 (two) times daily. 180 tablet 11   sacubitril-valsartan (ENTRESTO) 49-51 MG Take 1 tablet by mouth 2 (two) times daily. 60 tablet 11   sertraline (ZOLOFT) 25 MG tablet Take 1 tablet (25 mg total) by mouth daily. 30 tablet 5   spironolactone (ALDACTONE) 25 MG tablet TAKE ONE TABLET BY MOUTH AT BEDTIME 30 tablet 10   doxycycline (VIBRA-TABS) 100 MG tablet Take 1 tablet (100 mg total) by mouth 2 (two) times daily. (Patient not taking: Reported on 02/07/2021) 14 tablet 0   No current facility-administered medications for this encounter.   Allergies:   Patient has no known allergies.   Social History:  The patient  reports that he has quit smoking. His smoking use included cigarettes. He smoked an average of .5 packs per day. He has never used smokeless tobacco. He reports current alcohol use. He reports current drug use. Drug: Marijuana.   Family History:  The patient's family history is not on file.   ROS:  Please see the history of present illness.   All other systems are personally reviewed and negative.   Recent Labs: 12/27/2020: Magnesium 1.9 01/13/2021: B Natriuretic Peptide 2,527.5 02/06/2021: ALT 9; BUN 13; Creatinine, Ser 0.84; Hemoglobin 11.1; Platelets 268.0; Potassium 3.9; Pro B Natriuretic peptide (BNP) 3,234.0; Sodium 141  Personally reviewed   BP 112/70   Pulse 70   Wt 58.2 kg   SpO2 98%   BMI 20.72 kg/m   Wt Readings from Last 3 Encounters:  02/07/21 58.2 kg  02/06/21 57.6 kg  01/30/21 58.3 kg   Physical Exam: General:  NAD. No resp difficulty, thin HEENT: Normal Neck: Supple. No JVD. Carotids 2+ bilat; no bruits. No lymphadenopathy or thryomegaly appreciated. Cor: PMI nondisplaced. Regular rate & rhythm. No rubs, gallops or murmurs. Lungs: Clear Abdomen: Soft, nontender, nondistended. No hepatosplenomegaly. No  bruits or masses. Good bowel sounds. Extremities: No cyanosis, clubbing, rash, edema Neuro: Alert & oriented x 3, cranial nerves grossly intact. Moves all 4 extremities w/o difficulty. Affect pleasant.  ReDs: 33%  ASSESSMENT AND PLAN: 1.  Chronic biventricular heart failure, NICM - Echo 06/2018 EF 25-30%,  cMRI 06/27/2018 with EF 24%, Echo 01/2019 EF 20-25% - Echo 07/15/20 EF 20-25% RV ok - Echo 8/22 with EF 10-15%, moderately to severely reduced RV function, moderate to severe MR, moderate to severe AI. - Previously referred to EP for consideration of CRT-D with wide LBBB but didn't follow up. Likely too advanced  - NYHA II, volume good today.  - Continue Entresto 49/51 mg bid.  - Continue Farxiga 10 mg daily. - Continue Digoxin 0.125 daily. - Continue Spiro 25 daily. -  Hold off on beta blocker with dizziness today.  - He is not a candidate for advanced therapies. - Recent labs at PCP stable, check dig level today.   2. CAD - S/P CABG 2017, LHC 06/2018  with occluded LAD normal LCX and RCA. LIMA to LAD and SVG to D2 patent (SVG to D1 occluded) - No s/s angina.  - Continue aspirin 81 mg daily + atorvastatin.      3. LBBB - Previously referred to EP for consideration of CRT-D with wide LBBB but didn't follow up. Likely too advanced currently to benefit.   4. Valvular heart disease - Moderate to severe MR and moderate to severe AI on echo 8/22. LVIDd 6.9 cm -Trivial MR and mild AI on echo 2/22.   5.  Former Tobacco Use - No longer smoking.  - Lives with people who do smoke.   6. HTN - Stable today. - Continue current medications.   7. Anemia - Iron deficient. - Followed by PCP.  8. Illiterate - Cannot read. Able to write numbers. Needs assistance with medications.  Appreciate Paramedicine's assistance.   Follow up in 4-6 weeks with APP (anticipate adding low-dose carvedilol) and with Dr. [name] in 3 months + echo.  Cydney Ok, FNP  02/07/2021 11:10  AM  [facility] [address] Heart and Vascular [place] [address] [phone] (office) [phone] (fax)

## 2021-02-06 ENCOUNTER — Telehealth: Payer: Self-pay | Admitting: Medical

## 2021-02-06 ENCOUNTER — Other Ambulatory Visit: Payer: Self-pay

## 2021-02-06 ENCOUNTER — Other Ambulatory Visit (HOSPITAL_COMMUNITY): Payer: Self-pay | Admitting: Family Medicine

## 2021-02-06 ENCOUNTER — Telehealth (HOSPITAL_COMMUNITY): Payer: Self-pay

## 2021-02-06 ENCOUNTER — Ambulatory Visit (INDEPENDENT_AMBULATORY_CARE_PROVIDER_SITE_OTHER): Payer: Medicare Other | Admitting: Medical

## 2021-02-06 ENCOUNTER — Ambulatory Visit (HOSPITAL_BASED_OUTPATIENT_CLINIC_OR_DEPARTMENT_OTHER)
Admission: RE | Admit: 2021-02-06 | Discharge: 2021-02-06 | Disposition: A | Discharge status: Home / Self Care | Payer: Medicare Other | Source: Ambulatory Visit | Attending: Medical | Admitting: Medical

## 2021-02-06 VITALS — BP 124/60 | HR 77 | Resp 18 | Ht 66.0 in | Wt 127.0 lb

## 2021-02-06 DIAGNOSIS — D649 Anemia, unspecified: Secondary | ICD-10-CM | POA: Diagnosis not present

## 2021-02-06 DIAGNOSIS — E611 Iron deficiency: Secondary | ICD-10-CM

## 2021-02-06 DIAGNOSIS — I509 Heart failure, unspecified: Secondary | ICD-10-CM

## 2021-02-06 DIAGNOSIS — I2510 Atherosclerotic heart disease of native coronary artery without angina pectoris: Secondary | ICD-10-CM | POA: Diagnosis not present

## 2021-02-06 DIAGNOSIS — I10 Essential (primary) hypertension: Secondary | ICD-10-CM

## 2021-02-06 DIAGNOSIS — E876 Hypokalemia: Secondary | ICD-10-CM

## 2021-02-06 LAB — COMPREHENSIVE METABOLIC PANEL
ALT: 9 U/L (ref 0–53)
AST: 23 U/L (ref 0–37)
Albumin: 3.6 g/dL (ref 3.5–5.2)
Alkaline Phosphatase: 81 U/L (ref 39–117)
BUN: 13 mg/dL (ref 6–23)
CO2: 25 mEq/L (ref 19–32)
Calcium: 8.6 mg/dL (ref 8.4–10.5)
Chloride: 109 mEq/L (ref 96–112)
Creatinine, Ser: 0.84 mg/dL (ref 0.40–1.50)
GFR: 90.5 mL/min (ref >60.00)
Glucose, Bld: 76 mg/dL (ref 70–99)
Potassium: 3.9 mEq/L (ref 3.5–5.1)
Sodium: 141 mEq/L (ref 135–145)
Total Bilirubin: 0.6 mg/dL (ref 0.2–1.2)
Total Protein: 7.5 g/dL (ref 6.0–8.3)

## 2021-02-06 LAB — CBC WITH DIFFERENTIAL/PLATELET
Basophils Absolute: 0 10*3/uL (ref 0.0–0.1)
Basophils Relative: 0.8 % (ref 0.0–3.0)
Eosinophils Absolute: 0.1 10*3/uL (ref 0.0–0.7)
Eosinophils Relative: 1.8 % (ref 0.0–5.0)
HCT: 35.1 % — ABNORMAL LOW (ref 39.0–52.0)
Hemoglobin: 11.1 g/dL — ABNORMAL LOW (ref 13.0–17.0)
Lymphocytes Relative: 28.9 % (ref 12.0–46.0)
Lymphs Abs: 1.3 10*3/uL (ref 0.7–4.0)
MCHC: 31.8 g/dL (ref 30.0–36.0)
MCV: 98.3 fl (ref 78.0–100.0)
Monocytes Absolute: 0.7 10*3/uL (ref 0.1–1.0)
Monocytes Relative: 15.1 % — ABNORMAL HIGH (ref 3.0–12.0)
Neutro Abs: 2.4 10*3/uL (ref 1.4–7.7)
Neutrophils Relative %: 53.4 % (ref 43.0–77.0)
Platelets: 268 10*3/uL (ref 150.0–400.0)
RBC: 3.57 Mil/uL — ABNORMAL LOW (ref 4.22–5.81)
RDW: 20.4 % — ABNORMAL HIGH (ref 11.5–15.5)
WBC: 4.5 10*3/uL (ref 4.0–10.5)

## 2021-02-06 LAB — IRON: Iron: 33 ug/dL — ABNORMAL LOW (ref 42–165)

## 2021-02-06 LAB — BRAIN NATRIURETIC PEPTIDE: Pro B Natriuretic peptide (BNP): 3234 pg/mL — ABNORMAL HIGH (ref 0.0–100.0)

## 2021-02-06 MED ORDER — DOXYCYCLINE HYCLATE 100 MG PO TABS: 100.0000 mg | ORAL_TABLET | Freq: Two times a day (BID) | ORAL | 0 refills | Status: DC

## 2021-02-06 NOTE — Telephone Encounter (Signed)
Spoke to Jeremy Powers and reminded him of his [facility] pick up taking him to PCP today at 0845. He confirmed and said he was ready. I also reminded him of pick up for tomorrow. Call complete.

## 2021-02-06 NOTE — Telephone Encounter (Signed)
Rx doxycycline sent to pt pharmacy. 

## 2021-02-06 NOTE — Addendum Note (Signed)
Addended by: Gwenevere Abbot on: 02/06/2021 10:08 AM   Modules accepted: Orders

## 2021-02-06 NOTE — Patient Instructions (Addendum)
History of CHF with hospitalization weeks ago.  On review EF in the past was very low.  Presently stable post hospitalization and has seen cardiology.  On review weight of 110 pounds on day of cardiologist visit like today weight 127 pounds.  But clinically stable with O2 sat of 98% lying supine and no pedal edema.  For safety sake we will get repeat chest x-ray and a BNP.  History of hypertension.  Blood pressure stable.  History of low potassium, low iron and history of anemia.  We will recheck CMP, CBC and iron level.  Make change in 2 current medication regimen if needed/abnormality found.  Also tested positive for COVID recently in the hospital. Consider covid vaccine booster by age and covid risk score.   Follow-up date to be determined after lab review.

## 2021-02-06 NOTE — Progress Notes (Signed)
Subjective:    Patient ID: Jeremy Powers, male    DOB: Oct 21, 1955, 65 y.o.   MRN: [id number]  HPI  Pt in for follow up.   Last time I saw pt he was admitted to the hospital. That was on 12-23-2020.   "Admit date: 12/23/2020 Discharge date: 12/27/2020   Discharge disposition: Home     Recommendations for Outpatient Follow-Up:    Follow-up with CHF clinic on 01/03/2021 as scheduled. Follow-up with PCP in 1 week.   Discharge Diagnosis:    Principal Problem:   Acute on chronic systolic HF (heart failure) (HCC) Active Problems:   Aortic insufficiency   CAD (coronary artery disease)   Left bundle branch block   CHF exacerbation (HCC)   Hyperlipidemia   Iron deficiency anemia   Presence of aortocoronary bypass graft   COVID-19 virus infection   Mitral regurgitation    Hospital Course:    Jeremy Powers is a 65 y.o. 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   He was admitted to the hospital for acute exacerbation of chronic systolic and diastolic CHF.  He was treated with IV Lasix.  2D echo showed EF estimated at 10 to 15%.  He developed hypotension with treatment so cardiologist was consulted to assist with management.  Cardiologist recommended that Entresto and carvedilol be discontinued at discharge.  He was given IV iron infusion for iron deficiency anemia.    He also had hypokalemia that was repleted.  Incidentally, he tested positive for COVID-19 infection.  He was asymptomatic from potential COVID-19 infection was not treated.  His condition has improved and he is deemed stable for discharge to home today.  The importance of medical adherence was emphasized."  Echo showed very low very low at one point..  Post dc  5 weeks  approximat and pt states he feels very good. No wheezing. Pt states some daily mild dyspnea but nothing like prior to last hospitalization.   Pt has followed up with cardiologist post discharge from hospital.  Day of visit with cardiologist wt was 110 lb. Today weight on our scale showed 127 lb.   "Below is A/P from cardiologist.   "ASSESSMENT AND PLAN: 1.  Chronic biventricular heart failure, NICM - Echo 06/2018 EF 25-30%,  cMRI 06/27/2018 with EF 24%, Echo 01/2019 EF 20-25% - Echo 07/15/20 EF 20-25% RV ok - Echo 8/22 with EF 10-15%, moderately to severely reduced RV function, moderate to severe MR, moderate to severe AI. - Previously referred to EP for consideration of CRT-D with wide LBBB but didn't follow up. Likely too advanced  - NYHA II, volume good.  - Start Entresto 24/26 mg bid.  - Change lasix to prn. - Continue Farxiga 10 mg daily. - Continue Digoxin 0.125 daily. - Continue Spiro 25 daily. - Hold off on beta blocker for now with recent decompensation. - He is not a candidate for advanced therapies. - BMET and dig level today; repeat BMET in 10 days.   2. CAD - S/P CABG 2017, LHC 06/2018  with occluded LAD normal LCX and RCA. LIMA to LAD and SVG to D2 patent (SVG to D1 occluded) - No s/s angina.  - Continue aspirin 81 mg daily + atorvastatin.      3. LBBB - Previously  referred to EP for consideration of CRT-D with wide LBBB but didn't follow up. Likely too advanced currently to benefit.   4. Valvular heart disease - Moderate to severe MR and moderate to severe AI on echo 8/22. LVIDd 6.9 cm -Trivial MR and mild AI on echo 2/22.   5.  Former Tobacco Use - No longer smoking.  - Lives with people who do smoke.   6. HTN - Stable today.   7. Anemia - Iron deficient. - CBC today.   8 COVID infection - Resolved. - Encouraged vaccination"    Review of Systems  Constitutional:  Negative for chills, fatigue and fever.  Respiratory:  Positive for shortness of breath.  Negative for cough, chest tightness, wheezing and stridor.        Back to former baseline.  Cardiovascular:  Negative for chest pain and palpitations.  Gastrointestinal:  Negative for abdominal pain.  Genitourinary:  Negative for dysuria, flank pain and frequency.  Musculoskeletal:  Negative for back pain, joint swelling, myalgias and neck pain.      Objective:   Physical Exam  General Mental Status- Alert. General Appearance- Not in acute distress.   Skin General: Color- Normal Color. Moisture- Normal Moisture.  Neck Carotid Arteries- Normal color. Moisture- Normal Moisture. No carotid bruits. No JVD.  Chest and Lung Exam Auscultation: Breath Sounds:-Normal. Event unlabored. Lying supine 02 sat 98%.  Cardiovascular Auscultation:Rythm- Regular. Murmurs & Other Heart Sounds:Auscultation of the heart reveals- No Murmurs.  Abdomen Inspection:-Inspeection Normal. Palpation/Percussion:Note:No mass. Palpation and Percussion of the abdomen reveal- Non Tender, Non Distended + BS, no rebound or guarding.    Neurologic Cranial Nerve exam:- CN III-XII intact(No nystagmus), symmetric smile. Strength:- 5/5 equal and symmetric strength both upper and lower extremities.   Lower ext- no pedal edema. Negative homans sign.    Assessment & Plan:   Patient Instructions  History of CHF with hospitalization weeks ago.  On review EF in the past was very low.  Presently stable post hospitalization and has seen cardiology.  On review weight of 110 pounds on day of cardiologist visit like today weight 127 pounds.  But clinically stable with O2 sat of 98% lying supine and no pedal edema.  For safety sake we will get repeat chest x-ray and a BNP.  History of hypertension.  Blood pressure stable.  History of low potassium, low iron and history of anemia.  We will recheck CMP, CBC and iron level.  Make change in 2 current medication regimen if needed/abnormality found.  Also tested positive for  COVID recently in the hospital. Consider covid vaccine booster.   Follow-up date to be determined after lab review.

## 2021-02-06 NOTE — Telephone Encounter (Signed)
Called to confirm/remind patient of their appointment at the [facility] on 02/07/21.   Patient reminded to bring all medications and/or complete list.  Confirmed patient has transportation. Gave directions, instructed to utilize valet parking.  Confirmed appointment prior to ending call.

## 2021-02-07 ENCOUNTER — Ambulatory Visit (HOSPITAL_COMMUNITY)
Admission: RE | Admit: 2021-02-07 | Discharge: 2021-02-07 | Disposition: A | Discharge status: Home / Self Care | Payer: Medicare Other | Source: Ambulatory Visit | Attending: Family Medicine | Admitting: Family Medicine

## 2021-02-07 ENCOUNTER — Encounter (HOSPITAL_COMMUNITY): Payer: Self-pay

## 2021-02-07 ENCOUNTER — Other Ambulatory Visit (HOSPITAL_COMMUNITY): Payer: Self-pay

## 2021-02-07 VITALS — BP 112/70 | HR 70 | Wt 128.4 lb

## 2021-02-07 DIAGNOSIS — I428 Other cardiomyopathies: Secondary | ICD-10-CM | POA: Insufficient documentation

## 2021-02-07 DIAGNOSIS — I340 Nonrheumatic mitral (valve) insufficiency: Secondary | ICD-10-CM | POA: Diagnosis not present

## 2021-02-07 DIAGNOSIS — R42 Dizziness and giddiness: Secondary | ICD-10-CM | POA: Diagnosis present

## 2021-02-07 DIAGNOSIS — I351 Nonrheumatic aortic (valve) insufficiency: Secondary | ICD-10-CM | POA: Diagnosis not present

## 2021-02-07 DIAGNOSIS — Z955 Presence of coronary angioplasty implant and graft: Secondary | ICD-10-CM | POA: Diagnosis not present

## 2021-02-07 DIAGNOSIS — Z8616 Personal history of COVID-19: Secondary | ICD-10-CM | POA: Insufficient documentation

## 2021-02-07 DIAGNOSIS — Z7982 Long term (current) use of aspirin: Secondary | ICD-10-CM | POA: Diagnosis not present

## 2021-02-07 DIAGNOSIS — Z79899 Other long term (current) drug therapy: Secondary | ICD-10-CM | POA: Insufficient documentation

## 2021-02-07 DIAGNOSIS — I5022 Chronic systolic (congestive) heart failure: Secondary | ICD-10-CM

## 2021-02-07 DIAGNOSIS — Z951 Presence of aortocoronary bypass graft: Secondary | ICD-10-CM | POA: Diagnosis not present

## 2021-02-07 DIAGNOSIS — Z09 Encounter for follow-up examination after completed treatment for conditions other than malignant neoplasm: Secondary | ICD-10-CM | POA: Insufficient documentation

## 2021-02-07 DIAGNOSIS — I10 Essential (primary) hypertension: Secondary | ICD-10-CM

## 2021-02-07 DIAGNOSIS — I2510 Atherosclerotic heart disease of native coronary artery without angina pectoris: Secondary | ICD-10-CM | POA: Diagnosis not present

## 2021-02-07 DIAGNOSIS — R058 Other specified cough: Secondary | ICD-10-CM | POA: Insufficient documentation

## 2021-02-07 DIAGNOSIS — Z87891 Personal history of nicotine dependence: Secondary | ICD-10-CM | POA: Diagnosis not present

## 2021-02-07 DIAGNOSIS — I447 Left bundle-branch block, unspecified: Secondary | ICD-10-CM | POA: Diagnosis not present

## 2021-02-07 DIAGNOSIS — I110 Hypertensive heart disease with heart failure: Secondary | ICD-10-CM | POA: Insufficient documentation

## 2021-02-07 DIAGNOSIS — I5082 Biventricular heart failure: Secondary | ICD-10-CM | POA: Insufficient documentation

## 2021-02-07 DIAGNOSIS — D509 Iron deficiency anemia, unspecified: Secondary | ICD-10-CM

## 2021-02-07 DIAGNOSIS — Z7984 Long term (current) use of oral hypoglycemic drugs: Secondary | ICD-10-CM | POA: Insufficient documentation

## 2021-02-07 DIAGNOSIS — Z550 Illiteracy and low-level literacy: Secondary | ICD-10-CM

## 2021-02-07 LAB — DIGOXIN LEVEL: Digoxin Level: <0.2 ng/mL — ABNORMAL LOW (ref 0.8–2.0)

## 2021-02-07 NOTE — Patient Instructions (Addendum)
Labs were done today if any labs come back abnormal the clinic will call you  Your physician recommends that you schedule a follow-up appointment in: 4 weeks and then in 3 months with echocardiogram   At the [facility], you and your health needs are our priority. As part of our continuing mission to provide you with exceptional heart care, we have created designated Provider Care Teams. These Care Teams include your primary Cardiologist (physician) and Advanced Practice Providers (APPs- Physician Assistants and Nurse Practitioners) who all work together to provide you with the care you need, when you need it.   You may see any of the following providers on your designated Care Team at your next follow up: Dr Arvilla Meres Dr Marca Ancona Dr Brandon Melnick, NP Robbie Lis, [place] Mikki Santee Karle Plumber, PharmD   Please be sure to bring in all your medications bottles to every appointment.    If you have any questions or concerns before your next appointment please send [place] a message through [place] or call our office at [phone].    TO LEAVE A MESSAGE FOR THE NURSE SELECT OPTION 2, PLEASE LEAVE A MESSAGE INCLUDING: YOUR NAME DATE OF BIRTH CALL BACK NUMBER REASON FOR CALL**this is important as we prioritize the call backs  YOU WILL RECEIVE A CALL BACK THE SAME DAY AS LONG AS YOU CALL BEFORE 4:00 PM

## 2021-02-07 NOTE — Progress Notes (Signed)
Paramedicine Encounter    Patient ID: Jeremy Powers, male    DOB: [birth year], 65 y.o.   MRN: [id number]    Met with Raymund today in clinic with Allena Katz NP who made no med changes today and reports his RED CLIP is 33%. Ty reports feeling okay and he is doing well with his weekly pill boxes. I plan to see Austen in one week in the home. Clinic visit complete.   -Patient understands to start Doxycycline once RX gets delivered.   Refills: NONE   SOCIAL: NEEDS LISTS OF RENTAL HOMES/APARTMENTS IN [place] AND [place] -current rent >$1400    ACTION: Home visit completed

## 2021-02-07 NOTE — Progress Notes (Signed)
ReDS Vest / Clip - 02/07/21 1100       ReDS Vest / Clip   Station Marker C    Ruler Value 24    ReDS Value Range Low volume    ReDS Actual Value 33

## 2021-02-14 ENCOUNTER — Telehealth (HOSPITAL_COMMUNITY): Payer: Self-pay

## 2021-02-14 NOTE — Telephone Encounter (Signed)
Reached out to Jonita Albee where I tried to set up a home visit and he advised he wanted me to come next week instead. I asked if he had enough medicines because his pill box should be empty, he advised he missed some doses. I encouraged him to be taking his medications daily as prescribed. I will follow up next week. Call complete.

## 2021-02-21 ENCOUNTER — Telehealth (HOSPITAL_COMMUNITY): Payer: Self-pay

## 2021-02-21 NOTE — Telephone Encounter (Signed)
Attempted to reach Mr. Craun in attempt to schedule a home visit for today. No success. I will have Katie reach out next week. Call complete.

## 2021-02-27 ENCOUNTER — Telehealth (HOSPITAL_COMMUNITY): Payer: Self-pay

## 2021-02-27 NOTE — Telephone Encounter (Signed)
I called pt to sch home visit this week.  No answer.  LVM for him to call myself or heathers phone back.   Kerry Hough, EMT-Paramedic  02/27/21

## 2021-03-01 ENCOUNTER — Telehealth (HOSPITAL_COMMUNITY): Payer: Self-pay

## 2021-03-01 NOTE — Telephone Encounter (Signed)
Contacted pt again regarding home visit this week, again, no answer. I did LVM for him to return my call.   Kerry Hough, EMT-Paramedic  03/01/21

## 2021-03-06 ENCOUNTER — Other Ambulatory Visit (HOSPITAL_COMMUNITY): Payer: Self-pay | Admitting: Family Medicine

## 2021-03-07 ENCOUNTER — Encounter (HOSPITAL_COMMUNITY): Payer: Medicare Other

## 2021-03-07 NOTE — Progress Notes (Signed)
Advanced [facility] Clinic Note  Date:  03/09/2021   PCP:  [name], PA-C  Cardiologist:  None Primary HF: Dr. [name]  Chief Complaint: HF Follow up   History of Present Illness: Jeremy Powers is a 65 y.o.male CAD s/p PCI to LAD in 2015 and 2016, s/p CABG 2017, systolic HF EF 20-25%, microcytic/iron deficiency anemia followed by hematology and GI, LBBB, and tobacco use. Illiterate and cannot read.   In 2/20 he presented to [facility] ED with SOB, cough, and orthopnea in setting of med non-compliance. Was unable to afford copays with Medicaid. He was tachycardic and tachypneic on arrival, and was transferred to [facility] 06/22/18 for further evaluation. Echo showed newly decreased EF of 25-30% from 50-55% in 12/2017.  Underwent R/LHC showing severe 1 v disease with occluded LAD within previous stent. RCA and LCX normal. LIMA to LAD and SVG to D2 patent. Cath also showed low filling pressures with moderately low cardiac output. Meds adjusted as tolerated.   OV 04/05/20 returns for HF follow up after not being seen in > 1 year. Lives with his girlfriend. Followed by HF Paramedicine. Lasix was stopped & [name] was started.  Echo 2/22 LVEF 20-25%, RV ok.    3/22 he was discharged from community paramedicine program and bubble pill packs arranged.   Seen in the ED 10/25/20 for CHF exacerbation in the setting of medication non-compliance. He was diuresed with IV lasix and discharged home.  Sent to the ED 12/23/20 by his PCP and found to be A/C CHF and COVID +. Repeat echo EF 10-15%. Diuresed 13 lbs, beta blocker stopped with acute decompensation, digoxin added, and Entresto held with low BP with plans to restart as able.  Today he returns for HF follow up. Overall feeling ok, breathing is about the same.  Denies CP, dizziness, edema, or PND/Orthopnea. Appetite ok. No fever or chills. He thinks he may have missed a few days of medications. He drinks a lot soda during the day. Eating a lot of salty  foods-pork chops, bacon, and beans.   Cardiac Studies: - Echo 8/22: EF 10-15%, Repeat echo showed EF 10-15%, severe global hypokinesis with septal akinesis, LV severely dilated, grade II DD, RV fxn moderate to severely reduced, moderate to severe MR, moderate to severe AI.  - Echo 06/2920: EF 20-25%, RV ok  - Echo 01/2019: EF 20-25%  - Echo 06/2018: EF 20-25%  - cMRI 06/27/18: EF 24%. Normal RV   - RHC/LHC 06/24/2018  RA = 1 RV = 19/3 PA = 24/5 (14) PCW = 7 Fick cardiac output/index = 4.0/2.5 PVR = 1.9 WU Ao sat = 95% PA sat = 68%, 71%   Assessment: 1. Severe 1-vessel CAD with occluded LAD within previous stent 2. Normal RCA and LCX 3. LIMA to LAD widely patent 4. SVG - D2 widely patent 5. SVG - D1 likely occluded (grafts not marked)  ROS: All systems negative except as listed in HPI, PMH and Problem List.  Past Medical History:  Diagnosis Date   CHF (congestive heart failure) (HCC)    Cirrhosis (HCC)    Coronary artery disease    Hypertension    Pancreatitis    Past Surgical History:  Procedure Laterality Date   CARDIAC SURGERY     RIGHT/LEFT HEART CATH AND CORONARY/GRAFT ANGIOGRAPHY N/A 06/24/2018   Procedure: RIGHT/LEFT HEART CATH AND CORONARY/GRAFT ANGIOGRAPHY;  Surgeon: Dolores Patty, MD;  Location: [facility] INVASIVE CV LAB;  Service: Cardiovascular;  Laterality:  N/A;   Current Outpatient Medications  Medication Sig Dispense Refill   aspirin EC 81 MG tablet Take 81 mg by mouth daily.     atorvastatin (LIPITOR) 40 MG tablet TAKE ONE TABLET BY MOUTH EVERYDAY TABLET 6PM FOR CHOLESTEROL 30 tablet 4   carvedilol (COREG) 3.125 MG tablet Take 3.125 mg by mouth 2 (two) times daily with a meal.     digoxin (LANOXIN) 0.125 MG tablet TAKE ONE TABLET BY MOUTH DAILY 30 tablet 0   doxycycline (VIBRA-TABS) 100 MG tablet Take 1 tablet (100 mg total) by mouth 2 (two) times daily. 14 tablet 0   FARXIGA 10 MG TABS tablet TAKE ONE TABLET BY MOUTH DAILY IN THE MORNING BEFORE BREAKFAST 30  tablet 4   ferrous sulfate (FEROSUL) 325 (65 FE) MG tablet Take 1 tablet (325 mg total) by mouth daily with breakfast. (Patient taking differently: Take 325 mg by mouth 2 (two) times daily with a meal.)     furosemide (LASIX) 40 MG tablet TAKE ONE TABLET BY MOUTH DAILY AS NEEDED 15 tablet 0   potassium chloride SA (KLOR-CON) 20 MEQ tablet Take 3 tablets (60 mEq total) by mouth 2 (two) times daily. 180 tablet 11   sacubitril-valsartan (ENTRESTO) 49-51 MG Take 1 tablet by mouth 2 (two) times daily. 60 tablet 11   sertraline (ZOLOFT) 25 MG tablet Take 1 tablet (25 mg total) by mouth daily. 30 tablet 5   spironolactone (ALDACTONE) 25 MG tablet TAKE ONE TABLET BY MOUTH AT BEDTIME 30 tablet 10   No current facility-administered medications for this encounter.   Allergies:   Patient has no known allergies.   Social History:  The patient  reports that he has quit smoking. His smoking use included cigarettes. He smoked an average of .5 packs per day. He has never used smokeless tobacco. He reports current alcohol use. He reports current drug use. Drug: Marijuana.   Family History:  The patient's family history is not on file.   ROS:  Please see the history of present illness.   All other systems are personally reviewed and negative.   Recent Labs: 12/27/2020: Magnesium 1.9 01/13/2021: B Natriuretic Peptide 2,527.5 02/06/2021: ALT 9; BUN 13; Creatinine, Ser 0.84; Hemoglobin 11.1; Platelets 268.0; Potassium 3.9; Pro B Natriuretic peptide (BNP) 3,234.0; Sodium 141  Personally reviewed   BP 122/74   Pulse 89   Wt 66.7 kg (147 lb)   SpO2 97%   BMI 23.73 kg/m   Wt Readings from Last 3 Encounters:  03/09/21 66.7 kg (147 lb)  02/07/21 58.2 kg (128 lb 6.4 oz)  02/06/21 57.6 kg (127 lb)   Physical Exam: General:  NAD. No resp difficulty, thin HEENT: Normal Neck: Supple. JVP 7-8. Carotids 2+ bilat; no bruits. No lymphadenopathy or thryomegaly appreciated. Cor: PMI nondisplaced. Regular rate & rhythm.  No rubs, gallops or murmurs. Lungs: Clear Abdomen: Soft, nontender, nondistended. No hepatosplenomegaly. No bruits or masses. Good bowel sounds. Extremities: No cyanosis, clubbing, rash, 1+ BLE edema Neuro: Alert & oriented x 3, cranial nerves grossly intact. Moves all 4 extremities w/o difficulty. Affect pleasant.  ReDs: 34%  ASSESSMENT AND PLAN: 1.  Chronic biventricular heart failure, NICM - Echo 06/2018 EF 25-30%,  cMRI 06/27/2018 with EF 24%, Echo 01/2019 EF 20-25% - Echo 07/15/20 EF 20-25% RV ok - Echo 8/22 with EF 10-15%, moderately to severely reduced RV function, moderate to severe MR, moderate to severe AI. - Previously referred to EP for consideration of CRT-D with wide LBBB but didn't  follow up. Likely too advanced  - NYHA II, mildly volume up on exam, weight up, likely due to dietary indiscretion and missing several days of medications. - Start lasix 40 mg daily + 20 KCL - Continue carvedilol 3.125 mg bid. - Continue Entresto 49/51 mg bid.  - Continue Farxiga 10 mg daily. - Continue Digoxin 0.125 daily. - Continue Spiro 25 mg daily. - He is not a candidate for advanced therapies. - BMET and digoxin level today; repeat BMEt in 10 days.   2. CAD - S/P CABG 2017, LHC 06/2018  with occluded LAD normal LCX and RCA. LIMA to LAD and SVG to D2 patent (SVG to D1 occluded) - No s/s angina.  - Continue aspirin 81 mg daily + atorvastatin.      3. LBBB - Previously referred to EP for consideration of CRT-D with wide LBBB but didn't follow up. Likely too advanced currently to benefit.   4. Valvular heart disease - Moderate to severe MR and moderate to severe AI on echo 8/22. LVIDd 6.9 cm -Trivial MR and mild AI on echo 2/22.   5.  Former Tobacco Use - No longer smoking.  - Lives with people who do smoke.   6. HTN - Stable today. - Continue current medications.   7. Anemia - Iron deficient. On oral Fe. - Followed by PCP.  8. Illiterate - Cannot read. Able to write numbers.  Needs assistance with medications.  Appreciate Paramedicine's assistance.   Follow up in 4 weeks with APP to reassess volume.  Cydney Ok, FNP  03/09/2021 10:17 AM  Advanced [facility] Clinic [facility] [address] Heart and Vascular [place] [address] [phone] (office) [phone] (fax)

## 2021-03-08 ENCOUNTER — Telehealth (HOSPITAL_COMMUNITY): Payer: Self-pay

## 2021-03-08 NOTE — Telephone Encounter (Signed)
Called to confirm/remind patient of their appointment at the Advanced [facility] Failure Clinic on 03/09/21.   Patient reminded to bring all medications and/or complete list.  Confirmed patient has transportation. Gave directions, instructed to utilize valet parking.  Confirmed appointment prior to ending call.   

## 2021-03-09 ENCOUNTER — Encounter (HOSPITAL_COMMUNITY): Payer: Self-pay

## 2021-03-09 ENCOUNTER — Ambulatory Visit (HOSPITAL_COMMUNITY)
Admission: RE | Admit: 2021-03-09 | Discharge: 2021-03-09 | Disposition: A | Discharge status: Home / Self Care | Payer: Medicare Other | Source: Ambulatory Visit | Attending: Family Medicine | Admitting: Family Medicine

## 2021-03-09 ENCOUNTER — Other Ambulatory Visit (HOSPITAL_COMMUNITY): Payer: Self-pay

## 2021-03-09 ENCOUNTER — Other Ambulatory Visit: Payer: Self-pay

## 2021-03-09 VITALS — BP 122/74 | HR 89 | Wt 147.0 lb

## 2021-03-09 DIAGNOSIS — Z951 Presence of aortocoronary bypass graft: Secondary | ICD-10-CM | POA: Diagnosis not present

## 2021-03-09 DIAGNOSIS — Z7901 Long term (current) use of anticoagulants: Secondary | ICD-10-CM | POA: Diagnosis not present

## 2021-03-09 DIAGNOSIS — I428 Other cardiomyopathies: Secondary | ICD-10-CM | POA: Insufficient documentation

## 2021-03-09 DIAGNOSIS — I340 Nonrheumatic mitral (valve) insufficiency: Secondary | ICD-10-CM

## 2021-03-09 DIAGNOSIS — I110 Hypertensive heart disease with heart failure: Secondary | ICD-10-CM | POA: Diagnosis not present

## 2021-03-09 DIAGNOSIS — I351 Nonrheumatic aortic (valve) insufficiency: Secondary | ICD-10-CM | POA: Insufficient documentation

## 2021-03-09 DIAGNOSIS — Z7982 Long term (current) use of aspirin: Secondary | ICD-10-CM | POA: Insufficient documentation

## 2021-03-09 DIAGNOSIS — I5022 Chronic systolic (congestive) heart failure: Secondary | ICD-10-CM

## 2021-03-09 DIAGNOSIS — I2510 Atherosclerotic heart disease of native coronary artery without angina pectoris: Secondary | ICD-10-CM

## 2021-03-09 DIAGNOSIS — Z79899 Other long term (current) drug therapy: Secondary | ICD-10-CM | POA: Diagnosis not present

## 2021-03-09 DIAGNOSIS — Z09 Encounter for follow-up examination after completed treatment for conditions other than malignant neoplasm: Secondary | ICD-10-CM | POA: Insufficient documentation

## 2021-03-09 DIAGNOSIS — I10 Essential (primary) hypertension: Secondary | ICD-10-CM

## 2021-03-09 DIAGNOSIS — Z87891 Personal history of nicotine dependence: Secondary | ICD-10-CM | POA: Diagnosis not present

## 2021-03-09 DIAGNOSIS — I5082 Biventricular heart failure: Secondary | ICD-10-CM | POA: Diagnosis not present

## 2021-03-09 DIAGNOSIS — Z955 Presence of coronary angioplasty implant and graft: Secondary | ICD-10-CM | POA: Insufficient documentation

## 2021-03-09 DIAGNOSIS — Z550 Illiteracy and low-level literacy: Secondary | ICD-10-CM

## 2021-03-09 DIAGNOSIS — D509 Iron deficiency anemia, unspecified: Secondary | ICD-10-CM

## 2021-03-09 DIAGNOSIS — Z7984 Long term (current) use of oral hypoglycemic drugs: Secondary | ICD-10-CM | POA: Diagnosis not present

## 2021-03-09 DIAGNOSIS — Z8616 Personal history of COVID-19: Secondary | ICD-10-CM | POA: Insufficient documentation

## 2021-03-09 DIAGNOSIS — I447 Left bundle-branch block, unspecified: Secondary | ICD-10-CM | POA: Diagnosis not present

## 2021-03-09 LAB — BASIC METABOLIC PANEL
Anion gap: 5 (ref 5–15)
BUN: 5 mg/dL — ABNORMAL LOW (ref 8–23)
CO2: 26 mmol/L (ref 22–32)
Calcium: 8.1 mg/dL — ABNORMAL LOW (ref 8.9–10.3)
Chloride: 110 mmol/L (ref 98–111)
Creatinine, Ser: 0.67 mg/dL (ref 0.61–1.24)
GFR, Estimated: >60 mL/min (ref >60)
Glucose, Bld: 84 mg/dL (ref 70–99)
Potassium: 4.5 mmol/L (ref 3.5–5.1)
Sodium: 141 mmol/L (ref 135–145)

## 2021-03-09 LAB — DIGOXIN LEVEL: Digoxin Level: <0.2 ng/mL — ABNORMAL LOW (ref 0.8–2.0)

## 2021-03-09 MED ORDER — POTASSIUM CHLORIDE CRYS ER 20 MEQ PO TBCR: 20.0000 meq | EXTENDED_RELEASE_TABLET | Freq: Every day | ORAL | 3 refills | Status: DC

## 2021-03-09 MED ORDER — FUROSEMIDE 40 MG PO TABS: 40.0000 mg | ORAL_TABLET | Freq: Every day | ORAL | 3 refills | Status: DC

## 2021-03-09 NOTE — Patient Instructions (Addendum)
Labs done today. We will contact you only if your labs are abnormal.  START Lasix to 40mg  (1 tablet) by mouth daily.   START  Potassium (1 tablet) by mouth daily.   No medication changes were made. Please continue all current medications as prescribed.  Your physician recommends that you schedule a follow-up appointment in: 10 days for a lab only appointment and in 4 weeks with our APP Clinic  If you have any questions or concerns before your next appointment please send a message through [place] or call our office at [phone].    TO LEAVE A MESSAGE FOR THE NURSE SELECT OPTION 2, PLEASE LEAVE A MESSAGE INCLUDING: YOUR NAME DATE OF BIRTH CALL BACK NUMBER REASON FOR CALL**this is important as we prioritize the call backs  YOU WILL RECEIVE A CALL BACK THE SAME DAY AS LONG AS YOU CALL BEFORE 4:00 PM   Do the following things EVERYDAY: Weigh yourself in the morning before breakfast. Write it down and keep it in a log. Take your medicines as prescribed Eat low salt foods--Limit salt (sodium) to 2000 mg per day.  Stay as active as you can everyday Limit all fluids for the day to less than 2 liters   At the [facility] Clinic, you and your health needs are our priority. As part of our continuing mission to provide you with exceptional heart care, we have created designated Provider Care Teams. These Care Teams include your primary Cardiologist (physician) and Advanced Practice Providers (APPs- Physician Assistants and Nurse Practitioners) who all work together to provide you with the care you need, when you need it.   You may see any of the following providers on your designated Care Team at your next follow up: Dr [phone] Dr Arvilla Meres, NP Carron Curie, Robbie Lis [place], PharmD   Please be sure to bring in all your medications bottles to every appointment.

## 2021-03-09 NOTE — Progress Notes (Signed)
Paramedicine Encounter    Patient ID: Jeremy Powers, male    DOB: [birth year], 65 y.o.   MRN: [id number]    Met with Jeremy Powers in clinic today who reports doing well but missed some doses of his medications and failed to return paramedics phone calls for home visit over the last two weeks. I discussed with patient the importance of adhering to home visits and answering phone calls. He understood.   Med Changes: 58m Lasix daily  80MEQ Potassium BID   Repeat labs on Oct 31  I will see Jeremy Powers in two weeks.   ACTION: Home visit completed

## 2021-03-09 NOTE — Progress Notes (Signed)
ReDS Vest / Clip - 03/09/21 1000       ReDS Vest / Clip   Station Marker D    Ruler Value 27    ReDS Value Range Low volume    ReDS Actual Value 34

## 2021-03-20 ENCOUNTER — Other Ambulatory Visit (HOSPITAL_COMMUNITY): Payer: Medicare Other

## 2021-03-28 ENCOUNTER — Other Ambulatory Visit (HOSPITAL_COMMUNITY): Payer: Medicare Other

## 2021-03-28 ENCOUNTER — Telehealth (HOSPITAL_COMMUNITY): Payer: Self-pay

## 2021-03-28 NOTE — Telephone Encounter (Signed)
Attempted to reach Mr. Lorio in reference to labs appointment and home paramedicine visit with no success. I will attempt again on 11/10.

## 2021-04-06 ENCOUNTER — Telehealth (HOSPITAL_COMMUNITY): Payer: Self-pay

## 2021-04-06 NOTE — Telephone Encounter (Signed)
Called and left patient a detailed message to confirm/remind patient of their appointment at the [facility] on 04/07/21.   Patient can also give our office a call if needed.

## 2021-04-06 NOTE — Progress Notes (Incomplete)
Advanced Heart Failure Clinic Note  Date:  04/06/2021   PCP:  [name], PA-C  Cardiologist:  None Primary HF: Dr. [name]  Chief Complaint: HF Follow up   History of Present Illness: Jeremy Powers is a 65 y.o.male CAD s/p PCI to LAD in 2015 and 2016, s/p CABG 2017, systolic HF EF 20-25%, microcytic/iron deficiency anemia followed by hematology and GI, LBBB, and tobacco use. Illiterate and cannot read.   In 2/20 he presented to [facility] ED with SOB, cough, and orthopnea in setting of med non-compliance. Was unable to afford copays with Medicaid. He was tachycardic and tachypneic on arrival, and was transferred to [facility] 06/22/18 for further evaluation. Echo showed newly decreased EF of 25-30% from 50-55% in 12/2017.  Underwent R/LHC showing severe 1 v disease with occluded LAD within previous stent. RCA and LCX normal. LIMA to LAD and SVG to D2 patent. Cath also showed low filling pressures with moderately low cardiac output. Meds adjusted as tolerated.   OV 04/05/20 returns for HF follow up after not being seen in > 1 year. Lives with his girlfriend. Followed by HF Paramedicine. Lasix was stopped & [name] was started.  Echo 2/22 LVEF 20-25%, RV ok.    3/22 he was discharged from community paramedicine program and bubble pill packs arranged.   Seen in the ED 10/25/20 for CHF exacerbation in the setting of medication non-compliance. He was diuresed with IV lasix and discharged home.  Sent to the ED 12/23/20 by his PCP and found to be A/C CHF and COVID +. Repeat echo EF 10-15%. Diuresed 13 lbs, beta blocker stopped with acute decompensation, digoxin added, and Entresto held with low BP with plans to restart as able.  Today he returns for HF follow up. Overall feeling ok, breathing is about the same.  Denies CP, dizziness, edema, or PND/Orthopnea. Appetite ok. No fever or chills. He thinks he may have missed a few days of medications. He drinks a lot soda during the day. Eating a lot of salty  foods-pork chops, bacon, and beans.   Cardiac Studies: - Echo 8/22: EF 10-15%, Repeat echo showed EF 10-15%, severe global hypokinesis with septal akinesis, LV severely dilated, grade II DD, RV fxn moderate to severely reduced, moderate to severe MR, moderate to severe AI.  - Echo 06/2920: EF 20-25%, RV ok  - Echo 01/2019: EF 20-25%  - Echo 06/2018: EF 20-25%  - cMRI 06/27/18: EF 24%. Normal RV   - RHC/LHC 06/24/2018  RA = 1 RV = 19/3 PA = 24/5 (14) PCW = 7 Fick cardiac output/index = 4.0/2.5 PVR = 1.9 WU Ao sat = 95% PA sat = 68%, 71%   Assessment: 1. Severe 1-vessel CAD with occluded LAD within previous stent 2. Normal RCA and LCX 3. LIMA to LAD widely patent 4. SVG - D2 widely patent 5. SVG - D1 likely occluded (grafts not marked)  ROS: All systems negative except as listed in HPI, PMH and Problem List.  Past Medical History:  Diagnosis Date   CHF (congestive heart failure) (HCC)    Cirrhosis (HCC)    Coronary artery disease    Hypertension    Pancreatitis    Past Surgical History:  Procedure Laterality Date   CARDIAC SURGERY     RIGHT/LEFT HEART CATH AND CORONARY/GRAFT ANGIOGRAPHY N/A 06/24/2018   Procedure: RIGHT/LEFT HEART CATH AND CORONARY/GRAFT ANGIOGRAPHY;  Surgeon: Dolores Patty, MD;  Location: [facility] INVASIVE CV LAB;  Service: Cardiovascular;  Laterality:  N/A;   Current Outpatient Medications  Medication Sig Dispense Refill   aspirin EC 81 MG tablet Take 81 mg by mouth daily. (Patient not taking: Reported on 03/09/2021)     atorvastatin (LIPITOR) 40 MG tablet TAKE ONE TABLET BY MOUTH EVERYDAY TABLET 6PM FOR CHOLESTEROL 30 tablet 4   carvedilol (COREG) 3.125 MG tablet Take 3.125 mg by mouth 2 (two) times daily with a meal.     digoxin (LANOXIN) 0.125 MG tablet TAKE ONE TABLET BY MOUTH DAILY 30 tablet 0   doxycycline (VIBRA-TABS) 100 MG tablet Take 1 tablet (100 mg total) by mouth 2 (two) times daily. (Patient not taking: Reported on 03/09/2021) 14 tablet 0    FARXIGA 10 MG TABS tablet TAKE ONE TABLET BY MOUTH DAILY IN THE MORNING BEFORE BREAKFAST 30 tablet 4   ferrous sulfate (FEROSUL) 325 (65 FE) MG tablet Take 1 tablet (325 mg total) by mouth daily with breakfast. (Patient taking differently: Take 325 mg by mouth 2 (two) times daily with a meal.)     furosemide (LASIX) 40 MG tablet Take 1 tablet (40 mg total) by mouth daily. 90 tablet 3   potassium chloride SA (KLOR-CON) 20 MEQ tablet Take 1 tablet (20 mEq total) by mouth daily. 90 tablet 3   sacubitril-valsartan (ENTRESTO) 49-51 MG Take 1 tablet by mouth 2 (two) times daily. 60 tablet 11   sertraline (ZOLOFT) 25 MG tablet Take 1 tablet (25 mg total) by mouth daily. 30 tablet 5   spironolactone (ALDACTONE) 25 MG tablet TAKE ONE TABLET BY MOUTH AT BEDTIME 30 tablet 10   No current facility-administered medications for this visit.   Allergies:   Patient has no known allergies.   Social History:  The patient  reports that he has quit smoking. His smoking use included cigarettes. He smoked an average of .5 packs per day. He has never used smokeless tobacco. He reports current alcohol use. He reports current drug use. Drug: Marijuana.   Family History:  The patient's family history is not on file.   ROS:  Please see the history of present illness.   All other systems are personally reviewed and negative.   Recent Labs: 12/27/2020: Magnesium 1.9 01/13/2021: B Natriuretic Peptide 2,527.5 02/06/2021: ALT 9; Hemoglobin 11.1; Platelets 268.0; Pro B Natriuretic peptide (BNP) 3,234.0 03/09/2021: BUN 5; Creatinine, Ser 0.67; Potassium 4.5; Sodium 141  Personally reviewed   There were no vitals taken for this visit.  Wt Readings from Last 3 Encounters:  03/09/21 66.7 kg (147 lb)  02/07/21 58.2 kg (128 lb 6.4 oz)  02/06/21 57.6 kg (127 lb)   Physical Exam: General:  NAD. No resp difficulty, thin HEENT: Normal Neck: Supple. JVP 7-8. Carotids 2+ bilat; no bruits. No lymphadenopathy or thryomegaly  appreciated. Cor: PMI nondisplaced. Regular rate & rhythm. No rubs, gallops or murmurs. Lungs: Clear Abdomen: Soft, nontender, nondistended. No hepatosplenomegaly. No bruits or masses. Good bowel sounds. Extremities: No cyanosis, clubbing, rash, 1+ BLE edema Neuro: Alert & oriented x 3, cranial nerves grossly intact. Moves all 4 extremities w/o difficulty. Affect pleasant.  ReDs: 34%  ASSESSMENT AND PLAN: 1.  Chronic biventricular heart failure, NICM - Echo 06/2018 EF 25-30%,  cMRI 06/27/2018 with EF 24%, Echo 01/2019 EF 20-25% - Echo 07/15/20 EF 20-25% RV ok - Echo 8/22 with EF 10-15%, moderately to severely reduced RV function, moderate to severe MR, moderate to severe AI. - Previously referred to EP for consideration of CRT-D with wide LBBB but didn't follow up. Likely too  advanced  - NYHA II, mildly volume up on exam, weight up, likely due to dietary indiscretion and missing several days of medications. - Start lasix 40 mg daily + 20 KCL - Continue carvedilol 3.125 mg bid. - Continue Entresto 49/51 mg bid.  - Continue Farxiga 10 mg daily. - Continue Digoxin 0.125 daily. - Continue Spiro 25 mg daily. - He is not a candidate for advanced therapies. - BMET and digoxin level today; repeat BMEt in 10 days.   2. CAD - S/P CABG 2017, LHC 06/2018  with occluded LAD normal LCX and RCA. LIMA to LAD and SVG to D2 patent (SVG to D1 occluded) - No s/s angina.  - Continue aspirin 81 mg daily + atorvastatin.      3. LBBB - Previously referred to EP for consideration of CRT-D with wide LBBB but didn't follow up. Likely too advanced currently to benefit.   4. Valvular heart disease - Moderate to severe MR and moderate to severe AI on echo 8/22. LVIDd 6.9 cm -Trivial MR and mild AI on echo 2/22.   5.  Former Tobacco Use - No longer smoking.  - Lives with people who do smoke.   6. HTN - Stable today. - Continue current medications.   7. Anemia - Iron deficient. On oral Fe. - Followed by  PCP.  8. Illiterate - Cannot read. Able to write numbers. Needs assistance with medications.  Appreciate Paramedicine's assistance.   Follow up in 4 weeks with APP to reassess volume.  Cydney Ok, FNP  04/06/2021 1:03 PM  Advanced [facility] Clinic [facility] [address] Heart and Vascular [place] [address] [phone] (office) [phone] (fax)

## 2021-04-06 NOTE — Telephone Encounter (Signed)
Left message for Jeremy Powers who I am attempting to set up a home paramedicine visit with. He also has a HF clinic appointment tomorrow in which I reminded him of over voicemail. I will continue to reach out for home visit. Call complete.

## 2021-04-07 ENCOUNTER — Encounter (HOSPITAL_COMMUNITY): Payer: Medicare Other

## 2021-04-18 ENCOUNTER — Other Ambulatory Visit: Payer: Self-pay | Admitting: Cardiology

## 2021-04-20 ENCOUNTER — Telehealth (HOSPITAL_COMMUNITY): Payer: Self-pay

## 2021-04-20 NOTE — Telephone Encounter (Signed)
Spoke to Jeremy Powers who reported his phone was messing up but he is doing okay and has been taking his medications stating his roommate Jeremy Powers has been filling his pill box. He agreed to a home visit next week. I will call Monday and set up same. Call complete.

## 2021-04-25 ENCOUNTER — Other Ambulatory Visit: Payer: Self-pay | Admitting: Cardiology

## 2021-04-26 ENCOUNTER — Telehealth (HOSPITAL_COMMUNITY): Payer: Self-pay | Admitting: Licensed Clinical Social Worker

## 2021-04-26 NOTE — Telephone Encounter (Signed)
HF Paramedicine Team Based Care Meeting  HF MD- NA  HF NP - Amy Clegg NP-C   [facility] HF Paramedicine  Katie Vicente Males  [facility] admit within the last 30 days for heart failure? no  Medications concerns? Knows what he is supposed to do but sometimes chooses not to  Transportation issues ? no  Education needs? no  SDOH - concerns with drinking- leads to noncompliance/lack of communication  Eligible for discharge? Has MD visit on the 23rd- will assess readiness at that time.  Burna Sis, LCSW Clinical Social Worker [facility] Desk#: [phone] Cell#: [phone]

## 2021-04-27 ENCOUNTER — Telehealth (HOSPITAL_COMMUNITY): Payer: Self-pay

## 2021-04-27 NOTE — Telephone Encounter (Signed)
Attempted to reach [name] to schedule home visit for today and no answer. I left message for him to return my call. I will continue to follow.

## 2021-05-04 ENCOUNTER — Telehealth (HOSPITAL_COMMUNITY): Payer: Self-pay

## 2021-05-04 NOTE — Telephone Encounter (Signed)
Attempted to reach [name] for paramedicine home visit, no answer. I will attempt to follow up next week.

## 2021-05-06 ENCOUNTER — Other Ambulatory Visit: Payer: Self-pay

## 2021-05-06 ENCOUNTER — Encounter (HOSPITAL_BASED_OUTPATIENT_CLINIC_OR_DEPARTMENT_OTHER): Payer: Self-pay | Admitting: Emergency Medicine

## 2021-05-06 ENCOUNTER — Inpatient Hospital Stay (HOSPITAL_BASED_OUTPATIENT_CLINIC_OR_DEPARTMENT_OTHER)
Admission: EM | Admit: 2021-05-06 | Discharge: 2021-05-10 | DRG: 291 | Disposition: A | Discharge status: Home Health | Payer: Medicare Other | Attending: Family Medicine | Admitting: Family Medicine

## 2021-05-06 ENCOUNTER — Emergency Department (HOSPITAL_BASED_OUTPATIENT_CLINIC_OR_DEPARTMENT_OTHER): Payer: Medicare Other

## 2021-05-06 DIAGNOSIS — I509 Heart failure, unspecified: Secondary | ICD-10-CM

## 2021-05-06 DIAGNOSIS — Z91128 Patient's intentional underdosing of medication regimen for other reason: Secondary | ICD-10-CM

## 2021-05-06 DIAGNOSIS — I248 Other forms of acute ischemic heart disease: Secondary | ICD-10-CM | POA: Diagnosis present

## 2021-05-06 DIAGNOSIS — Z20822 Contact with and (suspected) exposure to covid-19: Secondary | ICD-10-CM | POA: Diagnosis present

## 2021-05-06 DIAGNOSIS — Z79899 Other long term (current) drug therapy: Secondary | ICD-10-CM

## 2021-05-06 DIAGNOSIS — I2510 Atherosclerotic heart disease of native coronary artery without angina pectoris: Secondary | ICD-10-CM | POA: Diagnosis present

## 2021-05-06 DIAGNOSIS — I5023 Acute on chronic systolic (congestive) heart failure: Secondary | ICD-10-CM | POA: Diagnosis present

## 2021-05-06 DIAGNOSIS — E876 Hypokalemia: Secondary | ICD-10-CM | POA: Diagnosis present

## 2021-05-06 DIAGNOSIS — I5043 Acute on chronic combined systolic (congestive) and diastolic (congestive) heart failure: Secondary | ICD-10-CM | POA: Diagnosis present

## 2021-05-06 DIAGNOSIS — I110 Hypertensive heart disease with heart failure: Principal | ICD-10-CM | POA: Diagnosis present

## 2021-05-06 DIAGNOSIS — E785 Hyperlipidemia, unspecified: Secondary | ICD-10-CM | POA: Diagnosis present

## 2021-05-06 DIAGNOSIS — I447 Left bundle-branch block, unspecified: Secondary | ICD-10-CM | POA: Diagnosis present

## 2021-05-06 DIAGNOSIS — Z9114 Patient's other noncompliance with medication regimen: Secondary | ICD-10-CM

## 2021-05-06 DIAGNOSIS — Z87891 Personal history of nicotine dependence: Secondary | ICD-10-CM

## 2021-05-06 DIAGNOSIS — Z951 Presence of aortocoronary bypass graft: Secondary | ICD-10-CM

## 2021-05-06 DIAGNOSIS — Z7982 Long term (current) use of aspirin: Secondary | ICD-10-CM

## 2021-05-06 DIAGNOSIS — Z8616 Personal history of COVID-19: Secondary | ICD-10-CM

## 2021-05-06 DIAGNOSIS — K7460 Unspecified cirrhosis of liver: Secondary | ICD-10-CM | POA: Diagnosis present

## 2021-05-06 DIAGNOSIS — I429 Cardiomyopathy, unspecified: Secondary | ICD-10-CM | POA: Diagnosis present

## 2021-05-06 DIAGNOSIS — D509 Iron deficiency anemia, unspecified: Secondary | ICD-10-CM | POA: Diagnosis present

## 2021-05-06 LAB — COMPREHENSIVE METABOLIC PANEL
ALT: 33 U/L (ref 0–44)
AST: 101 U/L — ABNORMAL HIGH (ref 15–41)
Albumin: 3.5 g/dL (ref 3.5–5.0)
Alkaline Phosphatase: 117 U/L (ref 38–126)
Anion gap: 13 (ref 5–15)
BUN: 14 mg/dL (ref 8–23)
CO2: 18 mmol/L — ABNORMAL LOW (ref 22–32)
Calcium: 8.6 mg/dL — ABNORMAL LOW (ref 8.9–10.3)
Chloride: 106 mmol/L (ref 98–111)
Creatinine, Ser: 0.93 mg/dL (ref 0.61–1.24)
GFR, Estimated: >60 mL/min (ref >60)
Glucose, Bld: 93 mg/dL (ref 70–99)
Potassium: 3.4 mmol/L — ABNORMAL LOW (ref 3.5–5.1)
Sodium: 137 mmol/L (ref 135–145)
Total Bilirubin: 1.5 mg/dL — ABNORMAL HIGH (ref 0.3–1.2)
Total Protein: 8.3 g/dL — ABNORMAL HIGH (ref 6.5–8.1)

## 2021-05-06 LAB — RESP PANEL BY RT-PCR (FLU A&B, COVID) ARPGX2
Influenza A by PCR: NEGATIVE
Influenza B by PCR: NEGATIVE
SARS Coronavirus 2 by RT PCR: NEGATIVE

## 2021-05-06 LAB — CBC
HCT: 39.6 % (ref 39.0–52.0)
Hemoglobin: 12.9 g/dL — ABNORMAL LOW (ref 13.0–17.0)
MCH: 29.7 pg (ref 26.0–34.0)
MCHC: 32.6 g/dL (ref 30.0–36.0)
MCV: 91 fL (ref 80.0–100.0)
Platelets: 207 10*3/uL (ref 150–400)
RBC: 4.35 MIL/uL (ref 4.22–5.81)
RDW: 18.6 % — ABNORMAL HIGH (ref 11.5–15.5)
WBC: 4.2 10*3/uL (ref 4.0–10.5)
nRBC: 0 % (ref 0.0–0.2)

## 2021-05-06 LAB — BRAIN NATRIURETIC PEPTIDE: B Natriuretic Peptide: >4500 pg/mL — ABNORMAL HIGH (ref 0.0–100.0)

## 2021-05-06 LAB — TROPONIN I (HIGH SENSITIVITY): Troponin I (High Sensitivity): 90 ng/L — ABNORMAL HIGH (ref <18)

## 2021-05-06 LAB — DIGOXIN LEVEL: Digoxin Level: <0.2 ng/mL — ABNORMAL LOW (ref 0.8–2.0)

## 2021-05-06 MED ORDER — FUROSEMIDE 10 MG/ML IJ SOLN
40.0000 mg | Freq: Once | INTRAMUSCULAR | Status: AC
  Administered 2021-05-07: 04:00:00 40 mg via INTRAVENOUS
  Filled 2021-05-06: qty 4

## 2021-05-06 MED ORDER — FUROSEMIDE 10 MG/ML IJ SOLN
40.0000 mg | Freq: Once | INTRAMUSCULAR | Status: AC
  Administered 2021-05-06: 40 mg via INTRAVENOUS
  Filled 2021-05-06: qty 4

## 2021-05-06 MED ORDER — POTASSIUM CHLORIDE CRYS ER 20 MEQ PO TBCR
40.0000 meq | EXTENDED_RELEASE_TABLET | Freq: Once | ORAL | Status: AC
  Administered 2021-05-06: 40 meq via ORAL
  Filled 2021-05-06: qty 2

## 2021-05-06 MED ORDER — POTASSIUM CHLORIDE 10 MEQ/100ML IV SOLN
10.0000 meq | Freq: Once | INTRAVENOUS | Status: AC
  Administered 2021-05-06: 10 meq via INTRAVENOUS
  Filled 2021-05-06: qty 100

## 2021-05-06 NOTE — ED Triage Notes (Signed)
BL lew swelling and shob x 1 - 2 weeks. Also endorses n/v and cough. Hx of chf. Last took lasix on Tuesday. Denies cp.

## 2021-05-06 NOTE — Plan of Care (Signed)
[facility] transfer: severe HFrEF.  Not taking meds as instructed (cant read).  Last had meds from EMS a couple days ago.  Not taking since due to inability to read.  In CHF exacerbation.  Lasix in ED.  No beds available so transfer may end up being delayed.  Will put in for tele bed.  [facility] will assume care on arrival to accepting facility. Until arrival, care as per EDP. However, [facility] available 24/7 for questions and assistance.  Nursing staff, please page [facility] Admits and Consults [phone]) as soon as the patient arrives the hospital.

## 2021-05-06 NOTE — ED Provider Notes (Signed)
[facility]ENCY DEPARTMENT Provider Note   CSN: [id number] Arrival date & time: 05/06/21  2137     History Chief Complaint  Patient presents with   Leg Swelling   Shortness of Breath    Jeremy Powers is a 65 y.o. male.  65 yo M with a chief complaints of shortness of breath and leg swelling.  Patient has a history of heart failure and has not taken his diuretics in about a week.  He tells me that he is unable to read and he only takes his medication when he has someone there to help tell him if he is supposed to take it or not.    The history is provided by the patient.  Shortness of Breath Severity:  Moderate Onset quality:  Gradual Duration:  2 hours Timing:  Constant Progression:  Worsening Chronicity:  New Relieved by:  Nothing Worsened by:  Nothing Ineffective treatments:  None tried Associated symptoms: no abdominal pain, no chest pain, no fever, no headaches, no rash and no vomiting       Past Medical History:  Diagnosis Date   CHF (congestive heart failure) (HCC)    Cirrhosis ([place])    Coronary artery disease    Hypertension    Pancreatitis     Patient Active Problem List   Diagnosis Date Noted   Mitral regurgitation 12/26/2020   COVID-19 virus infection 12/24/2020   CHF exacerbation ([place]) 12/23/2020   Acute on chronic systolic HF (heart failure) ([place]) 06/24/2018   Aortic insufficiency 06/22/2018   Acute on chronic congestive heart failure ([place]) 06/22/2018   Elevated troponin 06/22/2018   CAD (coronary artery disease) 06/22/2018   Left bundle branch block 06/22/2018   History of iron deficiency anemia 06/22/2018   Acute on chronic heart failure ([place]) 06/22/2018   Chronic bilateral low back pain without sciatica 12/12/2015   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   Iron deficiency anemia 03/28/2015   Hyperlipidemia 10/12/2014    Past Surgical History:  Procedure Laterality Date   CARDIAC SURGERY     RIGHT/LEFT HEART  CATH AND CORONARY/GRAFT ANGIOGRAPHY N/A 06/24/2018   Procedure: RIGHT/LEFT HEART CATH AND CORONARY/GRAFT ANGIOGRAPHY;  Surgeon: Jolaine Artist, MD;  Location: [facility];  Service: Cardiovascular;  Laterality: N/A;       Family History  Problem Relation Age of Onset   Iron deficiency Neg Hx     Social History   Tobacco Use   Smoking status: Former    Packs/day: 0.50    Types: Cigarettes   Smokeless tobacco: Never  Vaping Use   Vaping Use: Never used  Substance Use Topics   Alcohol use: Yes    Comment: daily   Drug use: Yes    Types: Marijuana    Home Medications Prior to Admission medications   Medication Sig Start Date End Date Taking? Authorizing Provider  aspirin EC 81 MG tablet Take 81 mg by mouth daily. Patient not taking: Reported on 03/09/2021    [provider]  atorvastatin (LIPITOR) 40 MG tablet TAKE ONE TABLET BY MOUTH EVERYDAY TABLET 6PM FOR CHOLESTEROL 01/05/21   Bensimhon, Shaune Pascal, MD  carvedilol (COREG) 3.125 MG tablet TAKE ONE TABLET BY MOUTH TWO TIMES A DAY WITH MEALS 04/18/21   Bensimhon, Shaune Pascal, MD  digoxin (LANOXIN) 0.125 MG tablet TAKE ONE TABLET BY MOUTH DAILY 01/10/21   Bensimhon, Shaune Pascal, MD  doxycycline (VIBRA-TABS) 100 MG tablet Take 1 tablet (100 mg total) by mouth 2 (  two) times daily. Patient not taking: Reported on 03/09/2021 02/06/21   Saguier, Ramon Dredge, PA-C  FARXIGA 10 MG TABS tablet TAKE ONE TABLET BY MOUTH DAILY IN THE MORNING BEFORE BREAKFAST 01/05/21   Bensimhon, Bevelyn Buckles, MD  ferrous sulfate 325 (65 FE) MG tablet TAKE ONE TABLET BY MOUTH TWO TIMES A DAY WITH MEALS 04/25/21   Bensimhon, Bevelyn Buckles, MD  furosemide (LASIX) 40 MG tablet Take 1 tablet (40 mg total) by mouth daily. 03/09/21   Milford, Anderson Malta, FNP  potassium chloride SA (KLOR-CON) 20 MEQ tablet Take 1 tablet (20 mEq total) by mouth daily. 03/09/21   Milford, Anderson Malta, FNP  sacubitril-valsartan (ENTRESTO) 49-51 MG Take 1 tablet by mouth 2 (two) times daily.  01/30/21   Bensimhon, Bevelyn Buckles, MD  sertraline (ZOLOFT) 25 MG tablet Take 1 tablet (25 mg total) by mouth daily. 12/22/20   Saguier, Ramon Dredge, PA-C  spironolactone (ALDACTONE) 25 MG tablet TAKE ONE TABLET BY MOUTH AT BEDTIME 10/03/20   Bensimhon, Bevelyn Buckles, MD    Allergies    Patient has no known allergies.  Review of Systems   Review of Systems  Constitutional:  Negative for chills and fever.  HENT:  Negative for congestion and facial swelling.   Eyes:  Negative for discharge and visual disturbance.  Respiratory:  Positive for shortness of breath.   Cardiovascular:  Positive for leg swelling. Negative for chest pain and palpitations.  Gastrointestinal:  Negative for abdominal pain, diarrhea and vomiting.  Musculoskeletal:  Negative for arthralgias and myalgias.  Skin:  Negative for color change and rash.  Neurological:  Negative for tremors, syncope and headaches.  Psychiatric/Behavioral:  Negative for confusion and dysphoric mood.    Physical Exam Updated Vital Signs BP (!) 147/79    Pulse (!) 101    Temp 97.9 F (36.6 C) (Oral)    Resp 17    SpO2 98%   Physical Exam Vitals and nursing note reviewed.  Constitutional:      Appearance: He is well-developed.  HENT:     Head: Normocephalic and atraumatic.  Eyes:     Pupils: Pupils are equal, round, and reactive to light.  Neck:     Vascular: JVD (to the jaw) present.  Cardiovascular:     Rate and Rhythm: Normal rate and regular rhythm.     Heart sounds: No murmur heard.   No friction rub. No gallop.  Pulmonary:     Effort: No respiratory distress.     Breath sounds: Rales (bases) present. No wheezing.  Abdominal:     General: There is no distension.     Tenderness: There is no abdominal tenderness. There is no guarding or rebound.  Musculoskeletal:        General: Normal range of motion.     Cervical back: Normal range of motion and neck supple.     Right lower leg: Edema present.     Left lower leg: Edema present.      Comments: Up to the thighs bilaterally  Skin:    Coloration: Skin is not pale.     Findings: No rash.  Neurological:     Mental Status: He is alert and oriented to person, place, and time.  Psychiatric:        Behavior: Behavior normal.    ED Results / Procedures / Treatments   Labs (all labs ordered are listed, but only abnormal results are displayed) Labs Reviewed  CBC - Abnormal; Notable for the following components:  Result Value   Hemoglobin 12.9 (*)    RDW 18.6 (*)    All other components within normal limits  COMPREHENSIVE METABOLIC PANEL - Abnormal; Notable for the following components:   Potassium 3.4 (*)    CO2 18 (*)    Calcium 8.6 (*)    Total Protein 8.3 (*)    AST 101 (*)    Total Bilirubin 1.5 (*)    All other components within normal limits  BRAIN NATRIURETIC PEPTIDE - Abnormal; Notable for the following components:   B Natriuretic Peptide >4,500.0 (*)    All other components within normal limits  TROPONIN I (HIGH SENSITIVITY) - Abnormal; Notable for the following components:   Troponin I (High Sensitivity) 90 (*)    All other components within normal limits  RESP PANEL BY RT-PCR (FLU A&B, COVID) [id number]  DIGOXIN LEVEL    EKG EKG Interpretation  Date/Time:  Saturday May 06 2021 21:49:23 EST Ventricular Rate:  90 PR Interval:  142 QRS Duration: 199 QT Interval:  484 QTC Calculation: 593 R Axis:   -19 Text Interpretation: Sinus or ectopic atrial rhythm Probable left atrial enlargement Left bundle branch block No significant change since last tracing Confirmed by Deno Etienne [phone]) on 05/06/2021 10:48:24 PM  Radiology DG Chest 2 View  Result Date: 05/06/2021 CLINICAL DATA:  Dyspnea EXAM: CHEST - 2 VIEW COMPARISON:  02/06/2021 FINDINGS: Lungs are clear. No pneumothorax or pleural effusion. Coronary artery bypass grafting has been performed. Mild cardiomegaly is stable. Pulmonary vascularity is normal. No acute bone abnormality. IMPRESSION: No  active cardiopulmonary disease.  Stable cardiomegaly Electronically Signed   By: Fidela Salisbury M.D.   On: 05/06/2021 22:59    Procedures Procedures   Medications Ordered in ED Medications  potassium chloride SA (KLOR-CON M) CR tablet 40 mEq (has no administration in time range)  furosemide (LASIX) injection 40 mg (40 mg Intravenous Given 05/06/21 2214)    ED Course  I have reviewed the triage vital signs and the nursing notes.  Pertinent labs & imaging results that were available during my care of the patient were reviewed by me and considered in my medical decision making (see chart for details).    MDM Rules/Calculators/A&P                         65 yo M with a chief complaints of leg swelling and difficulty breathing.  This been going on for a couple weeks now.  Has a history of heart failure and is on digoxin.  Tells me that he does not know which medications to take and so has not really been taking them unless there is someone there to help him.  BNP is elevated, troponin is mildly elevated likely due to worsening fluid overload.  No chest pain.  Very mild hypokalemia.  Renal function mildly worse than baseline.  With the extent that the patient is fluid overloaded and his tachypnea and elevated troponin will discuss with medicine for admission.  The patients results and plan were reviewed and discussed.   Any x-rays performed were independently reviewed by myself.   Differential diagnosis were considered with the presenting HPI.  Medications  potassium chloride SA (KLOR-CON M) CR tablet 40 mEq (has no administration in time range)  furosemide (LASIX) injection 40 mg (40 mg Intravenous Given 05/06/21 2214)    Vitals:   05/06/21 2148 05/06/21 2150  BP: (!) 147/79   Pulse: (!) 101   Resp: 17  Temp:  97.9 F (36.6 C)  TempSrc:  Oral  SpO2: 98%     Final diagnoses:  Acute on chronic systolic congestive heart failure (HCC)    Admission/ observation were  discussed with the admitting physician, patient and/or family and they are comfortable with the plan.      Final Clinical Impression(s) / ED Diagnoses Final diagnoses:  Acute on chronic systolic congestive heart failure [facility])    Rx / DC Orders ED Discharge Orders          [place]        05/06/21 2231    Face-to-face encounter (required for Medicare/Medicaid patients)       Comments: I Cecilio Asper certify that this patient is under my care and that I, or a nurse practitioner or physician's assistant working with me, had a face-to-face encounter that meets the physician face-to-face encounter requirements with this patient on 05/06/2021. The encounter with the patient was in whole, or in part for the following medical condition(s) which is the primary reason for home health care (List medical condition): Patient has poor literacy level and is unable to read or understand his medications.  He has advanced heart failure requiring multiple medications to be taken regularly at home and depends on others to door to his house and talk to him about which ones to take into and visually give them to him each day.  If they are not available on a given day he does not take any medications.   05/06/21 [place], [place], DO 05/06/21 2305

## 2021-05-07 ENCOUNTER — Encounter (HOSPITAL_COMMUNITY): Payer: Self-pay | Admitting: Internal Medicine

## 2021-05-07 DIAGNOSIS — Z9114 Patient's other noncompliance with medication regimen: Secondary | ICD-10-CM | POA: Diagnosis not present

## 2021-05-07 DIAGNOSIS — Z91128 Patient's intentional underdosing of medication regimen for other reason: Secondary | ICD-10-CM | POA: Diagnosis not present

## 2021-05-07 DIAGNOSIS — I5043 Acute on chronic combined systolic (congestive) and diastolic (congestive) heart failure: Secondary | ICD-10-CM | POA: Diagnosis not present

## 2021-05-07 DIAGNOSIS — Z951 Presence of aortocoronary bypass graft: Secondary | ICD-10-CM | POA: Diagnosis not present

## 2021-05-07 DIAGNOSIS — I447 Left bundle-branch block, unspecified: Secondary | ICD-10-CM | POA: Diagnosis not present

## 2021-05-07 DIAGNOSIS — Z20822 Contact with and (suspected) exposure to covid-19: Secondary | ICD-10-CM | POA: Diagnosis not present

## 2021-05-07 DIAGNOSIS — Z7982 Long term (current) use of aspirin: Secondary | ICD-10-CM | POA: Diagnosis not present

## 2021-05-07 DIAGNOSIS — Z8616 Personal history of COVID-19: Secondary | ICD-10-CM | POA: Diagnosis not present

## 2021-05-07 DIAGNOSIS — I248 Other forms of acute ischemic heart disease: Secondary | ICD-10-CM | POA: Diagnosis not present

## 2021-05-07 DIAGNOSIS — D509 Iron deficiency anemia, unspecified: Secondary | ICD-10-CM | POA: Diagnosis not present

## 2021-05-07 DIAGNOSIS — E876 Hypokalemia: Secondary | ICD-10-CM | POA: Diagnosis not present

## 2021-05-07 DIAGNOSIS — I429 Cardiomyopathy, unspecified: Secondary | ICD-10-CM | POA: Diagnosis not present

## 2021-05-07 DIAGNOSIS — Z87891 Personal history of nicotine dependence: Secondary | ICD-10-CM | POA: Diagnosis not present

## 2021-05-07 DIAGNOSIS — Z79899 Other long term (current) drug therapy: Secondary | ICD-10-CM | POA: Diagnosis not present

## 2021-05-07 DIAGNOSIS — K7460 Unspecified cirrhosis of liver: Secondary | ICD-10-CM | POA: Diagnosis not present

## 2021-05-07 DIAGNOSIS — E785 Hyperlipidemia, unspecified: Secondary | ICD-10-CM

## 2021-05-07 DIAGNOSIS — I5023 Acute on chronic systolic (congestive) heart failure: Secondary | ICD-10-CM | POA: Diagnosis present

## 2021-05-07 DIAGNOSIS — I110 Hypertensive heart disease with heart failure: Secondary | ICD-10-CM | POA: Diagnosis not present

## 2021-05-07 DIAGNOSIS — I2510 Atherosclerotic heart disease of native coronary artery without angina pectoris: Secondary | ICD-10-CM | POA: Diagnosis not present

## 2021-05-07 MED ORDER — ENOXAPARIN SODIUM 40 MG/0.4ML IJ SOSY
40.0000 mg | PREFILLED_SYRINGE | INTRAMUSCULAR | Status: DC
  Administered 2021-05-07 – 2021-05-09 (×3): 40 mg via SUBCUTANEOUS
  Filled 2021-05-07 (×3): qty 0.4

## 2021-05-07 MED ORDER — ATORVASTATIN CALCIUM 40 MG PO TABS
40.0000 mg | ORAL_TABLET | Freq: Every evening | ORAL | Status: DC
  Administered 2021-05-07 – 2021-05-09 (×3): 40 mg via ORAL
  Filled 2021-05-07 (×3): qty 1

## 2021-05-07 MED ORDER — FUROSEMIDE 10 MG/ML IJ SOLN
40.0000 mg | Freq: Every day | INTRAMUSCULAR | Status: DC
  Administered 2021-05-08: 11:00:00 40 mg via INTRAVENOUS
  Filled 2021-05-07: qty 4

## 2021-05-07 MED ORDER — SACUBITRIL-VALSARTAN 49-51 MG PO TABS
1.0000 | ORAL_TABLET | Freq: Two times a day (BID) | ORAL | Status: DC
  Administered 2021-05-07 – 2021-05-10 (×5): 1 via ORAL
  Filled 2021-05-07 (×7): qty 1

## 2021-05-07 MED ORDER — SODIUM CHLORIDE 0.9% FLUSH
3.0000 mL | Freq: Two times a day (BID) | INTRAVENOUS | Status: DC
  Administered 2021-05-07 – 2021-05-10 (×5): 3 mL via INTRAVENOUS

## 2021-05-07 MED ORDER — FERROUS SULFATE 325 (65 FE) MG PO TABS
325.0000 mg | ORAL_TABLET | Freq: Every day | ORAL | Status: DC
  Administered 2021-05-08: 10:00:00 325 mg via ORAL
  Filled 2021-05-07: qty 1

## 2021-05-07 MED ORDER — ACETAMINOPHEN 325 MG PO TABS: 650.0000 mg | ORAL_TABLET | ORAL | Status: DC | PRN

## 2021-05-07 MED ORDER — CARVEDILOL 3.125 MG PO TABS
3.1250 mg | ORAL_TABLET | Freq: Two times a day (BID) | ORAL | Status: DC
  Administered 2021-05-08 – 2021-05-10 (×4): 3.125 mg via ORAL
  Filled 2021-05-07 (×4): qty 1

## 2021-05-07 MED ORDER — POTASSIUM CHLORIDE CRYS ER 20 MEQ PO TBCR
20.0000 meq | EXTENDED_RELEASE_TABLET | Freq: Every day | ORAL | Status: DC
  Administered 2021-05-07: 20:00:00 20 meq via ORAL
  Filled 2021-05-07: qty 1

## 2021-05-07 MED ORDER — SODIUM CHLORIDE 0.9 % IV SOLN: 250.0000 mL | INTRAVENOUS | Status: DC | PRN

## 2021-05-07 MED ORDER — SPIRONOLACTONE 25 MG PO TABS
25.0000 mg | ORAL_TABLET | Freq: Every day | ORAL | Status: DC
  Administered 2021-05-07 – 2021-05-10 (×4): 25 mg via ORAL
  Filled 2021-05-07 (×4): qty 1

## 2021-05-07 MED ORDER — INFLUENZA VAC A&B SA ADJ QUAD 0.5 ML IM PRSY
0.5000 mL | PREFILLED_SYRINGE | INTRAMUSCULAR | Status: DC
  Filled 2021-05-07: qty 0.5

## 2021-05-07 MED ORDER — PNEUMOCOCCAL VAC POLYVALENT 25 MCG/0.5ML IJ INJ
0.5000 mL | INJECTION | INTRAMUSCULAR | Status: DC
  Filled 2021-05-07: qty 0.5

## 2021-05-07 MED ORDER — DIGOXIN 125 MCG PO TABS
125.0000 ug | ORAL_TABLET | Freq: Every day | ORAL | Status: DC
  Administered 2021-05-07 – 2021-05-10 (×4): 125 ug via ORAL
  Filled 2021-05-07 (×4): qty 1

## 2021-05-07 MED ORDER — DAPAGLIFLOZIN PROPANEDIOL 10 MG PO TABS
10.0000 mg | ORAL_TABLET | Freq: Every day | ORAL | Status: DC
  Administered 2021-05-08 – 2021-05-10 (×3): 10 mg via ORAL
  Filled 2021-05-07 (×3): qty 1

## 2021-05-07 MED ORDER — SERTRALINE HCL 25 MG PO TABS
25.0000 mg | ORAL_TABLET | Freq: Every day | ORAL | Status: DC
  Administered 2021-05-07 – 2021-05-10 (×4): 25 mg via ORAL
  Filled 2021-05-07 (×4): qty 1

## 2021-05-07 MED ORDER — ONDANSETRON HCL 4 MG/2ML IJ SOLN: 4.0000 mg | Freq: Four times a day (QID) | INTRAMUSCULAR | Status: DC | PRN

## 2021-05-07 MED ORDER — SODIUM CHLORIDE 0.9% FLUSH: 3.0000 mL | INTRAVENOUS | Status: DC | PRN

## 2021-05-07 NOTE — H&P (Signed)
[facility] History and Physical  Jeremy Powers NLZ:[id number] DOB: 1955-11-11 DOA: 05/06/2021  Referring physician: ED  PCP: [name], PA-C   Patient is coming from: Home  Chief Complaint: Shortness of breath  HPI: Jeremy Powers is a 65 y.o. male with past medical history of chronic systolic and diastolic heart failure, hypertension, hyperlipidemia, iron deficiency anemia, , history of coronary artery disease status post PCI and CABG  presented to the hospital with increasing shortness of breath, fatigue.  His latest ejection fraction from 4 months back was estimated at 10 to 15%.  Patient follows up with cardiology as outpatient.  Patient states that he does have plenty of medications at home including refills but he has not been taking them because of difficulty reading the labels.  He has good eye sight but is not able to read letters.   Denies any dizziness lightheadedness but has shortness of breath on exertion.  Patient is able to walk to his mailbox and back but feels very fatigued while doing that.  He states that he is not been taking his medication for the last 1 month.  Denies any chest pain, fever, chills or rigor.  Denies any nausea, vomiting, abdominal pain.  Denies any sick contacts or recent travel.  Lives with his girlfriend at home.  Patient states that he is sometimes incontinent of urine.  Denies cough with a sputum production.  Patient states that he has gained weight and has been having lower extremity swelling but unable to quantify how much.  He usually sleeps in a recliner and has orthopnea.  ED Course: In the ED, patient had blood pressure 147/79 on presentation with a heart rate of 101.  Patient was afebrile.  Initial labs showed a hypokalemia with a potassium of 3.4.  Creatinine was 0.9.  Digoxin level was noted to be less than 0.2.  BNP was elevated at 4500.  COVID and influenza was negative.  Patient was then considered for admission to the hospital for  decompensated heart failure.  Review of Systems:  All systems were reviewed and were negative unless otherwise mentioned in the HPI  Past Medical History:  Diagnosis Date   CHF (congestive heart failure) (HCC)    Cirrhosis (HCC)    Coronary artery disease    Hypertension    Pancreatitis    Past Surgical History:  Procedure Laterality Date   CARDIAC SURGERY     RIGHT/LEFT HEART CATH AND CORONARY/GRAFT ANGIOGRAPHY N/A 06/24/2018   Procedure: RIGHT/LEFT HEART CATH AND CORONARY/GRAFT ANGIOGRAPHY;  Surgeon: Dolores Patty, MD;  Location: [facility];  Service: Cardiovascular;  Laterality: N/A;    Social History:  reports that he has quit smoking. His smoking use included cigarettes. He smoked an average of .5 packs per day. He has never used smokeless tobacco. He reports current alcohol use. He reports current drug use. Drug: Marijuana.  No Known Allergies  Family History  Problem Relation Age of Onset   Iron deficiency Neg Hx      Prior to Admission medications   Medication Sig Start Date End Date Taking? Authorizing Provider  aspirin EC 81 MG tablet Take 81 mg by mouth daily. Patient not taking: Reported on 03/09/2021    [provider]  atorvastatin (LIPITOR) 40 MG tablet TAKE ONE TABLET BY MOUTH EVERYDAY TABLET 6PM FOR CHOLESTEROL 01/05/21   Bensimhon, Bevelyn Buckles, MD  carvedilol (COREG) 3.125 MG tablet TAKE ONE TABLET BY MOUTH TWO TIMES A DAY WITH MEALS 04/18/21  Bensimhon, Shaune Pascal, MD  digoxin (LANOXIN) 0.125 MG tablet TAKE ONE TABLET BY MOUTH DAILY 01/10/21   Bensimhon, Shaune Pascal, MD  doxycycline (VIBRA-TABS) 100 MG tablet Take 1 tablet (100 mg total) by mouth 2 (two) times daily. Patient not taking: Reported on 03/09/2021 02/06/21   Saguier, Percell Miller, PA-C  FARXIGA 10 MG TABS tablet TAKE ONE TABLET BY MOUTH DAILY IN THE MORNING BEFORE BREAKFAST 01/05/21   Bensimhon, Shaune Pascal, MD  ferrous sulfate 325 (65 FE) MG tablet TAKE ONE TABLET BY MOUTH TWO TIMES A DAY WITH  MEALS 04/25/21   Bensimhon, Shaune Pascal, MD  furosemide (LASIX) 40 MG tablet Take 1 tablet (40 mg total) by mouth daily. 03/09/21   Milford, Maricela Bo, FNP  potassium chloride SA (KLOR-CON) 20 MEQ tablet Take 1 tablet (20 mEq total) by mouth daily. 03/09/21   Milford, Maricela Bo, FNP  sacubitril-valsartan (ENTRESTO) 49-51 MG Take 1 tablet by mouth 2 (two) times daily. 01/30/21   Bensimhon, Shaune Pascal, MD  sertraline (ZOLOFT) 25 MG tablet Take 1 tablet (25 mg total) by mouth daily. 12/22/20   Saguier, Percell Miller, PA-C  spironolactone (ALDACTONE) 25 MG tablet TAKE ONE TABLET BY MOUTH AT BEDTIME 10/03/20   Bensimhon, Shaune Pascal, MD    Physical Exam: Vitals:   05/07/21 1530 05/07/21 1600 05/07/21 1702 05/07/21 1705  BP: 125/77 135/72  135/76  Pulse: 88 84  87  Resp: 16 16  18   Temp:    98.7 F (37.1 C)  TempSrc:      SpO2: 98% 97%  96%  Weight:   67.7 kg   Height:   5\' 4"  (1.626 m)    Wt Readings from Last 3 Encounters:  05/07/21 67.7 kg  03/09/21 66.7 kg  02/07/21 58.2 kg   Body mass index is 25.62 kg/m.  General:  Average built, not in obvious distress HENT: Normocephalic, pupils equally reacting to light and accommodation.  No scleral pallor or icterus noted. Oral mucosa is moist.  Chest:   Diminished breath sounds bilaterally.  Bilateral crackles noted to CVS: S1 &S2 heard. No murmur.  Regular rate and rhythm. Abdomen: Soft, nontender, nondistended.  Bowel sounds are heard.  Liver is not palpable, no abdominal mass palpated Extremities: No cyanosis, clubbing lateral diffuse pitting edema noted.  Edema extending up to the lower thigh.  Peripheral pulses are palpable. Psych: Alert, awake and oriented, normal mood CNS:  No cranial nerve deficits.  Power equal in all extremities.    Skin: Warm and dry.  No rashes noted.  Labs on Admission:   CBC: Recent Labs  Lab 05/06/21 2159  WBC 4.2  HGB 12.9*  HCT 39.6  MCV 91.0  PLT [id number]    Basic Metabolic Panel: Recent Labs  Lab 05/06/21 2159   NA 137  K 3.4*  CL 106  CO2 18*  GLUCOSE 93  BUN 14  CREATININE 0.93  CALCIUM 8.6*    Liver Function Tests: Recent Labs  Lab 05/06/21 2159  AST 101*  ALT 33  ALKPHOS 117  BILITOT 1.5*  PROT 8.3*  ALBUMIN 3.5   No results for input(s): LIPASE, AMYLASE in the last 168 hours. No results for input(s): AMMONIA in the last 168 hours.  Cardiac Enzymes: No results for input(s): CKTOTAL, CKMB, CKMBINDEX, TROPONINI in the last 168 hours.  BNP (last 3 results) Recent Labs    12/23/20 1753 01/13/21 1013 05/06/21 2159  BNP >4,500.0* 2,527.5* >4,500.0*    ProBNP (last 3 results) Recent Labs  12/23/20 1217 02/06/21 1009  PROBNP >4833.0* 3,234.0*    CBG: No results for input(s): GLUCAP in the last 168 hours.  Lipase  No results found for: LIPASE   Urinalysis No results found for: COLORURINE, APPEARANCEUR, LABSPEC, PHURINE, GLUCOSEU, HGBUR, BILIRUBINUR, KETONESUR, PROTEINUR, UROBILINOGEN, NITRITE, LEUKOCYTESUR   Drugs of Abuse  No results found for: LABOPIA, COCAINSCRNUR, [place], [place], [facility], [place] on Admission: DG Chest 2 View  Result Date: 05/06/2021 CLINICAL DATA:  Dyspnea EXAM: CHEST - 2 VIEW COMPARISON:  02/06/2021 FINDINGS: Lungs are clear. No pneumothorax or pleural effusion. Coronary artery bypass grafting has been performed. Mild cardiomegaly is stable. Pulmonary vascularity is normal. No acute bone abnormality. IMPRESSION: No active cardiopulmonary disease.  Stable cardiomegaly Electronically Signed   By: Fidela Salisbury M.D.   On: 05/06/2021 22:59    EKG: Personally reviewed by me which shows left bundle branch block.  Unchanged from previous EKG.  Assessment/Plan Principal Problem:   Acute on chronic systolic CHF (congestive heart failure) (HCC) Active Problems:   Hyperlipidemia   Iron deficiency anemia  Acute on chronic systolic heart failure.  Patient has orthopnea, dyspnea on exertion with gross peripheral edema.  BNP  on presentation more than 4500. Patient is noncompliant to medication due to lack of inability to read the labels.  Patient has advanced heart failure and follows up with cardiology as outpatient has not seen cardiology recently.  We will continue strict intake and output charting and daily weights, CHF protocol.  Patient will need assistance on discharge, PT OT evaluation as appropriate.  We will get 2D echocardiogram at this time.  Continue Lisabeth Register, digoxin, Spironolactone and Lasix IV daily.  Continue potassium supplement.  Closely monitor.  Digoxin level was subtherapeutic.  Patient is COVID and influenza negative.  Elevated troponin.  No chest pain.  Patient appears to be volume overload.  Likely secondary to decompensated heart failure.  Patient has severe systolic dysfunction.  History of coronary artery disease status post CABG.  No acute chest pain at this time.  EKG with left bundle branch block.  Unchanged from prior.  Mild hypokalemia.  We will replenish.  Closely monitor on Entresto.  Patient will be resumed on Lasix while in the hospital.  Essential hypertension.  Patient will be on diuretics and Entresto.  Continue Coreg.   DVT Prophylaxis: Heparin subcu  Consultant: Heart failure team  Code Status: Full code  Microbiology none  Antibiotics: None  Family Communication:  Patients' condition and plan of care including tests being ordered have been discussed with the patient and  who indicate understanding and agree with the plan.   Status is: Observation  The patient remains OBS appropriate and will d/c before 2 midnights.  Severity of Illness: The appropriate patient status for this patient is OBSERVATION. Observation status is judged to be reasonable and necessary in order to provide the required intensity of service to ensure the patient's safety. The patient's presenting symptoms, physical exam findings, and initial radiographic and laboratory data in the  context of their medical condition is felt to place them at decreased risk for further clinical deterioration. Furthermore, it is anticipated that the patient will be medically stable for discharge from the hospital within 2 midnights of admission.   Signed, Flora Lipps, MD [facility] 05/07/2021

## 2021-05-07 NOTE — ED Notes (Signed)
Phone Handoff Report given to [employer] ([id number])

## 2021-05-07 NOTE — ED Notes (Signed)
Attempted to call report to [facility]. Staff reports primary RN will call back when available.

## 2021-05-07 NOTE — ED Notes (Signed)
[facility] at bedside 

## 2021-05-07 NOTE — Plan of Care (Signed)
Problem: Clinical Measurements: Goal: Respiratory complications will improve Outcome: Progressing Goal: Cardiovascular complication will be avoided Outcome: Progressing   Problem: Activity: Goal: Risk for activity intolerance will decrease Outcome: Progressing   Problem: Education: Goal: Knowledge of General Education information will improve Description: Including pain rating scale, medication(s)/side effects and non-pharmacologic comfort measures Outcome: Completed/Met

## 2021-05-08 ENCOUNTER — Observation Stay (HOSPITAL_COMMUNITY): Payer: Medicare Other

## 2021-05-08 DIAGNOSIS — Z9114 Patient's other noncompliance with medication regimen: Secondary | ICD-10-CM | POA: Diagnosis not present

## 2021-05-08 DIAGNOSIS — K7460 Unspecified cirrhosis of liver: Secondary | ICD-10-CM | POA: Diagnosis present

## 2021-05-08 DIAGNOSIS — I110 Hypertensive heart disease with heart failure: Secondary | ICD-10-CM | POA: Diagnosis present

## 2021-05-08 DIAGNOSIS — I5021 Acute systolic (congestive) heart failure: Secondary | ICD-10-CM | POA: Diagnosis not present

## 2021-05-08 DIAGNOSIS — Z87891 Personal history of nicotine dependence: Secondary | ICD-10-CM | POA: Diagnosis not present

## 2021-05-08 DIAGNOSIS — Z7982 Long term (current) use of aspirin: Secondary | ICD-10-CM | POA: Diagnosis not present

## 2021-05-08 DIAGNOSIS — I429 Cardiomyopathy, unspecified: Secondary | ICD-10-CM | POA: Diagnosis present

## 2021-05-08 DIAGNOSIS — Z8616 Personal history of COVID-19: Secondary | ICD-10-CM | POA: Diagnosis not present

## 2021-05-08 DIAGNOSIS — Z20822 Contact with and (suspected) exposure to covid-19: Secondary | ICD-10-CM | POA: Diagnosis present

## 2021-05-08 DIAGNOSIS — E785 Hyperlipidemia, unspecified: Secondary | ICD-10-CM | POA: Diagnosis present

## 2021-05-08 DIAGNOSIS — Z951 Presence of aortocoronary bypass graft: Secondary | ICD-10-CM | POA: Diagnosis not present

## 2021-05-08 DIAGNOSIS — I509 Heart failure, unspecified: Secondary | ICD-10-CM

## 2021-05-08 DIAGNOSIS — E876 Hypokalemia: Secondary | ICD-10-CM | POA: Diagnosis present

## 2021-05-08 DIAGNOSIS — I5043 Acute on chronic combined systolic (congestive) and diastolic (congestive) heart failure: Secondary | ICD-10-CM | POA: Diagnosis present

## 2021-05-08 DIAGNOSIS — I447 Left bundle-branch block, unspecified: Secondary | ICD-10-CM | POA: Diagnosis present

## 2021-05-08 DIAGNOSIS — D509 Iron deficiency anemia, unspecified: Secondary | ICD-10-CM | POA: Diagnosis present

## 2021-05-08 DIAGNOSIS — Z91128 Patient's intentional underdosing of medication regimen for other reason: Secondary | ICD-10-CM | POA: Diagnosis not present

## 2021-05-08 DIAGNOSIS — I5023 Acute on chronic systolic (congestive) heart failure: Secondary | ICD-10-CM | POA: Diagnosis present

## 2021-05-08 DIAGNOSIS — I248 Other forms of acute ischemic heart disease: Secondary | ICD-10-CM | POA: Diagnosis present

## 2021-05-08 DIAGNOSIS — I2510 Atherosclerotic heart disease of native coronary artery without angina pectoris: Secondary | ICD-10-CM | POA: Diagnosis present

## 2021-05-08 DIAGNOSIS — Z79899 Other long term (current) drug therapy: Secondary | ICD-10-CM | POA: Diagnosis not present

## 2021-05-08 LAB — CBC
HCT: 34.2 % — ABNORMAL LOW (ref 39.0–52.0)
Hemoglobin: 11.6 g/dL — ABNORMAL LOW (ref 13.0–17.0)
MCH: 29.7 pg (ref 26.0–34.0)
MCHC: 33.9 g/dL (ref 30.0–36.0)
MCV: 87.5 fL (ref 80.0–100.0)
Platelets: 202 10*3/uL (ref 150–400)
RBC: 3.91 MIL/uL — ABNORMAL LOW (ref 4.22–5.81)
RDW: 18.4 % — ABNORMAL HIGH (ref 11.5–15.5)
WBC: 4.7 10*3/uL (ref 4.0–10.5)
nRBC: 0 % (ref 0.0–0.2)

## 2021-05-08 LAB — ECHOCARDIOGRAM COMPLETE
Area-P 1/2: 3.91 cm2
Calc EF: 31.3 %
Height: 64 in
MV M vel: 4.55 m/s
MV Peak grad: 82.8 mmHg
P 1/2 time: 475 msec
Radius: 0.4 cm
S' Lateral: 5.8 cm
Single Plane A2C EF: 36.2 %
Single Plane A4C EF: 21.2 %
Weight: 2345.69 oz

## 2021-05-08 LAB — COMPREHENSIVE METABOLIC PANEL
ALT: 55 U/L — ABNORMAL HIGH (ref 0–44)
AST: 136 U/L — ABNORMAL HIGH (ref 15–41)
Albumin: 2.8 g/dL — ABNORMAL LOW (ref 3.5–5.0)
Alkaline Phosphatase: 94 U/L (ref 38–126)
Anion gap: 8 (ref 5–15)
BUN: 14 mg/dL (ref 8–23)
CO2: 24 mmol/L (ref 22–32)
Calcium: 8.3 mg/dL — ABNORMAL LOW (ref 8.9–10.3)
Chloride: 107 mmol/L (ref 98–111)
Creatinine, Ser: 0.95 mg/dL (ref 0.61–1.24)
GFR, Estimated: >60 mL/min (ref >60)
Glucose, Bld: 99 mg/dL (ref 70–99)
Potassium: 3.4 mmol/L — ABNORMAL LOW (ref 3.5–5.1)
Sodium: 139 mmol/L (ref 135–145)
Total Bilirubin: 2.4 mg/dL — ABNORMAL HIGH (ref 0.3–1.2)
Total Protein: 6.9 g/dL (ref 6.5–8.1)

## 2021-05-08 LAB — PHOSPHORUS: Phosphorus: 2.7 mg/dL (ref 2.5–4.6)

## 2021-05-08 LAB — MAGNESIUM: Magnesium: 1.5 mg/dL — ABNORMAL LOW (ref 1.7–2.4)

## 2021-05-08 MED ORDER — PERFLUTREN LIPID MICROSPHERE
1.0000 mL | INTRAVENOUS | Status: AC | PRN
Start: 2021-05-08 — End: 2021-05-08
  Administered 2021-05-08: 13:00:00 2 mL via INTRAVENOUS
  Filled 2021-05-08: qty 10

## 2021-05-08 MED ORDER — POLYSACCHARIDE IRON COMPLEX 150 MG PO CAPS
150.0000 mg | ORAL_CAPSULE | Freq: Every day | ORAL | Status: DC
  Administered 2021-05-08 – 2021-05-10 (×3): 150 mg via ORAL
  Filled 2021-05-08 (×3): qty 1

## 2021-05-08 MED ORDER — POTASSIUM CHLORIDE CRYS ER 20 MEQ PO TBCR
40.0000 meq | EXTENDED_RELEASE_TABLET | Freq: Every day | ORAL | Status: DC
  Administered 2021-05-08 – 2021-05-10 (×3): 40 meq via ORAL
  Filled 2021-05-08 (×3): qty 2

## 2021-05-08 MED ORDER — MAGNESIUM SULFATE 2 GM/50ML IV SOLN
2.0000 g | Freq: Once | INTRAVENOUS | Status: AC
  Administered 2021-05-08: 12:00:00 2 g via INTRAVENOUS
  Filled 2021-05-08: qty 50

## 2021-05-08 MED ORDER — ASPIRIN EC 81 MG PO TBEC
81.0000 mg | DELAYED_RELEASE_TABLET | Freq: Every day | ORAL | Status: DC
  Administered 2021-05-08 – 2021-05-10 (×3): 81 mg via ORAL
  Filled 2021-05-08 (×3): qty 1

## 2021-05-08 MED ORDER — FUROSEMIDE 10 MG/ML IJ SOLN
40.0000 mg | Freq: Two times a day (BID) | INTRAMUSCULAR | Status: DC
  Administered 2021-05-08 – 2021-05-10 (×4): 40 mg via INTRAVENOUS
  Filled 2021-05-08 (×4): qty 4

## 2021-05-08 MED ORDER — MAGNESIUM OXIDE -MG SUPPLEMENT 400 (240 MG) MG PO TABS
400.0000 mg | ORAL_TABLET | Freq: Two times a day (BID) | ORAL | Status: DC
  Administered 2021-05-08 – 2021-05-10 (×5): 400 mg via ORAL
  Filled 2021-05-08 (×5): qty 1

## 2021-05-08 NOTE — Consult Note (Addendum)
Cardiology Consultation:   Patient ID: Jeremy Powers MRN: [id number]; DOB: [birth year]  Admit date: 05/06/2021 Date of Consult: 05/08/2021  PCP:  Mackie Pai, PA-C   [facility] Providers Cardiologist:  Glori Bickers, MD   {      Patient Profile:   Jeremy Powers is a 65 y.o. male with a hx of systolic and diastolic CHF, HTN, HLD, iron deficiency anemia, hx of CAD PCI to LAD in 2015 and 2016, s/p CABG 2017 who is being seen 05/08/2021 for the evaluation of acute on chronic systolic CHF at the request of Dr. Louanne Belton.  History of Present Illness:   [name] presented to the ED on 12/17 complaining of bilateral lower extremity edema, SOB for the past 1-2 weeks. Reported associated nausea and vomiting, cough. Denied chest pain. Patient is followed by cardiology and on several outpatient medications for CHF. However, the patient reported that he is unable to read and only takes his medications when there is someone to tell him what medications to take. In the ED, EKG showed sinus rhythm with LBBB (preexisting). BNP >4500. HSTN 90. Labs showed Na 137, K 3.4, creatinine 0.93, eGFR >60, hemoglobin 12.9, WBC 4.2. CXR showed no active cardiopulmonary disease. Stable cardiomegaly.   Most recent echo was 12/24/2020 and showed LVEF 10-15% (down from 25-30% on 07/15/2020), severe global hypokinesis,septal akinesis.   Patient has been hospitalized multiple times for his CHF. Notably, patient was admitted from 06/22/2018-06/27/2018 for acute on chronic CHF in the setting of medication noncompliance. During that hospitalization, echo 06/23/2018 showed EF 25-30%, when the EF was previously 50-55% in 2019. Patient underwent right and left heart catheterization which showed severe 1 vesel disease with occluded LAD within previous stent. RCA and LCX normal. LIMA to LAD and SVG to D2 patent. Cath also showed low filling pressures with moderately low cardiac output.   Patient was again seen in the ED for CHF  exacerbation and hypoxia in the setting of medication non-compliance. Patient was given IV diuresis with lasix, and his hypoxia improved. Patient was sent home and was not admitted to the hospital.   Patient was again admitted to the hospital from 8/5-8/9. Patient was initially seen by his PCP with complaints of increased dyspnea and wheezing for the past 2-3 weeks. BNP> [id number]. HSTN 23 and flat. PCP directed the patient to the ED. In the ED, patient tested positive for COVID. He was diuresed with IV Lasix, and his dyspnea improved.    Past Medical History:  Diagnosis Date   CHF (congestive heart failure) (HCC)    Cirrhosis ([place])    Coronary artery disease    Hypertension    Pancreatitis     Past Surgical History:  Procedure Laterality Date   CARDIAC SURGERY     RIGHT/LEFT HEART CATH AND CORONARY/GRAFT ANGIOGRAPHY N/A 06/24/2018   Procedure: RIGHT/LEFT HEART CATH AND CORONARY/GRAFT ANGIOGRAPHY;  Surgeon: Jolaine Artist, MD;  Location: [facility];  Service: Cardiovascular;  Laterality: N/A;     Home Medications:  Prior to Admission medications   Medication Sig Start Date End Date Taking? Authorizing Provider  atorvastatin (LIPITOR) 40 MG tablet TAKE ONE TABLET BY MOUTH EVERYDAY TABLET 6PM FOR CHOLESTEROL Patient not taking: Reported on 05/07/2021 01/05/21   Bensimhon, Shaune Pascal, MD  carvedilol (COREG) 3.125 MG tablet TAKE ONE TABLET BY MOUTH TWO TIMES A DAY WITH MEALS Patient not taking: Reported on 05/07/2021 04/18/21   Bensimhon, Shaune Pascal, MD  digoxin (LANOXIN) 0.125 MG tablet TAKE  ONE TABLET BY MOUTH DAILY Patient not taking: Reported on 05/07/2021 01/10/21   Bensimhon, Shaune Pascal, MD  FARXIGA 10 MG TABS tablet TAKE ONE TABLET BY MOUTH DAILY IN THE MORNING BEFORE BREAKFAST Patient not taking: Reported on 05/07/2021 01/05/21   Bensimhon, Shaune Pascal, MD  ferrous sulfate 325 (65 FE) MG tablet TAKE ONE TABLET BY MOUTH TWO TIMES A DAY WITH MEALS Patient not taking: Reported on  05/07/2021 04/25/21   Bensimhon, Shaune Pascal, MD  furosemide (LASIX) 40 MG tablet Take 1 tablet (40 mg total) by mouth daily. Patient not taking: Reported on 05/07/2021 03/09/21   Rafael Bihari, FNP  potassium chloride SA (KLOR-CON) 20 MEQ tablet Take 1 tablet (20 mEq total) by mouth daily. Patient not taking: Reported on 05/07/2021 03/09/21   Rafael Bihari, FNP  sacubitril-valsartan (ENTRESTO) 49-51 MG Take 1 tablet by mouth 2 (two) times daily. Patient not taking: Reported on 05/07/2021 01/30/21   Bensimhon, Shaune Pascal, MD  sertraline (ZOLOFT) 25 MG tablet Take 1 tablet (25 mg total) by mouth daily. Patient not taking: Reported on 05/07/2021 12/22/20   Saguier, Percell Miller, PA-C  spironolactone (ALDACTONE) 25 MG tablet TAKE ONE TABLET BY MOUTH AT BEDTIME Patient not taking: Reported on 05/07/2021 10/03/20   Bensimhon, Shaune Pascal, MD    Inpatient Medications: Scheduled Meds:  aspirin EC  81 mg Oral Daily   atorvastatin  40 mg Oral QPM   carvedilol  3.125 mg Oral BID WC   dapagliflozin propanediol  10 mg Oral Q breakfast   digoxin  125 mcg Oral Daily   enoxaparin (LOVENOX) injection  40 mg Subcutaneous Q24H   ferrous sulfate  325 mg Oral Q breakfast   furosemide  40 mg Intravenous Daily   influenza vaccine adjuvanted  0.5 mL Intramuscular Tomorrow-1000   pneumococcal 23 valent vaccine  0.5 mL Intramuscular Tomorrow-1000   potassium chloride SA  40 mEq Oral Daily   sacubitril-valsartan  1 tablet Oral BID   sertraline  25 mg Oral Daily   sodium chloride flush  3 mL Intravenous Q12H   spironolactone  25 mg Oral Daily   Continuous Infusions:  sodium chloride     magnesium sulfate bolus IVPB     PRN Meds: sodium chloride, acetaminophen, ondansetron (ZOFRAN) IV, sodium chloride flush  Allergies:   No Known Allergies  Social History:   Social History   Socioeconomic History   Marital status: Single    Spouse name: Not on file   Number of children: Not on file   Years of education:  Not on file   Highest education level: Not on file  Occupational History   Not on file  Tobacco Use   Smoking status: Former    Packs/day: 0.50    Types: Cigarettes   Smokeless tobacco: Never  Vaping Use   Vaping Use: Never used  Substance and Sexual Activity   Alcohol use: Yes    Comment: daily   Drug use: Yes    Types: Marijuana   Sexual activity: Not on file  Other Topics Concern   Not on file  Social History Narrative   Not on file   Social Determinants of Health   Financial Resource Strain: Not on file  Food Insecurity: Not on file  Transportation Needs: No Transportation Needs   Lack of Transportation (Medical): No   Lack of Transportation (Non-Medical): No  Physical Activity: Not on file  Stress: Not on file  Social Connections: Not on file  Intimate Partner Violence: Not  on file    Family History:    Family History  Problem Relation Age of Onset   Iron deficiency Neg Hx      ROS:  Please see the history of present illness.   All other ROS reviewed and negative.     Physical Exam/Data:   Vitals:   05/08/21 0131 05/08/21 0447 05/08/21 0450 05/08/21 1012  BP: 127/69 (!) 118/51  130/61  Pulse: 77 72  71  Resp: _0 Temp: 98.2 F (36.8 C) 98.4 F (36.9 C)    TempSrc: Oral Oral    SpO2: 96% 98%  98%  Weight:   66.5 kg   Height:       No intake or output data in the 24 hours ending 05/08/21 1026  Last 3 Weights 05/08/2021 05/07/2021 03/09/2021  Weight (lbs) 146 lb 9.7 oz 149 lb 4 oz 147 lb  Weight (kg) 66.5 kg 67.7 kg 66.679 kg     Body mass index is 25.16 kg/m.  General:  Well nourished, sitting comfortably in the chair  HEENT: normal, poor dentition  Vascular: Radial pulses 2+ bilaterally Cardiac:  normal S1, S2; RRR; no murmur Lungs:  Expiratory wheezes heard in all lung fields, no rales or rhonchi  Abd: soft, nontender Ext: 3+ pitting edema in the lower extremities  Musculoskeletal:  No deformities  Skin: warm and dry  Neuro:   CNs 2-12 intact, no focal abnormalities noted Psych:  Normal affect   EKG:  The EKG was personally reviewed and demonstrates: Sinus rhythm, preexisting LBBB  Telemetry:  Telemetry was personally reviewed and demonstrates:  Sinus rhythm   Relevant CV Studies:  Echo 12/23/2020  1. Compared to prior study from 07/15/20, RVEF is depressed; LVEF is  probably worse and Valvular dysfunction is worse.   2. Severe global hypokineiss; septal akinesis.. Left ventricular ejection  fraction, by estimation, is 10-15%. The left ventricle has severely  decreased function. The left ventricular internal cavity size was severely  dilated. Left ventricular diastolic  parameters are consistent with Grade II diastolic dysfunction  (pseudonormalization). Elevated left atrial pressure.   3. Right ventricular systolic function moderate to severely reduced. The  right ventricular size is normal.   4. Left atrial size was moderately dilated.   5. Right atrial size was moderately dilated.   6. The mitral valve is normal in structure. Moderate to severe mitral  valve regurgitation.   7. Tricuspid valve regurgitation is moderate.   8. The aortic valve is tricuspid. Aortic valve regurgitation is moderate  to severe. Mild aortic valve sclerosis is present, with no evidence of  aortic valve stenosis.   9. The inferior vena cava is dilated in size with <50% respiratory  variability, suggesting right atrial pressure of 15 mmHg.   Laboratory Data:  High Sensitivity Troponin:   Recent Labs  Lab 05/06/21 2159  TROPONINIHS 90*     Chemistry Recent Labs  Lab 05/06/21 2159 05/08/21 0423  NA 137 139  K 3.4* 3.4*  CL 106 107  CO2 18* 24  GLUCOSE 93 99  BUN 14 14  CREATININE 0.93 0.95  CALCIUM 8.6* 8.3*  MG  --  1.5*  GFRNONAA >60 >60  ANIONGAP 13 8    Recent Labs  Lab 05/06/21 2159 05/08/21 0423  PROT 8.3* 6.9  ALBUMIN 3.5 2.8*  AST 101* 136*  ALT 33 55*  ALKPHOS 117 94  BILITOT 1.5* 2.4*    Lipids No results for input(s): CHOL, TRIG, HDL,  LABVLDL, LDLCALC, CHOLHDL in the last 168 hours.  Hematology Recent Labs  Lab 05/06/21 2159 05/08/21 0423  WBC 4.2 4.7  RBC 4.35 3.91*  HGB 12.9* 11.6*  HCT 39.6 34.2*  MCV 91.0 87.5  MCH 29.7 29.7  MCHC 32.6 33.9  RDW 18.6* 18.4*  PLT 207 202   Thyroid No results for input(s): TSH, FREET4 in the last 168 hours.  BNP Recent Labs  Lab 05/06/21 2159  BNP >4,500.0*    DDimer No results for input(s): DDIMER in the last 168 hours.   Radiology/Studies:  DG Chest 2 View  Result Date: 05/06/2021 CLINICAL DATA:  Dyspnea EXAM: CHEST - 2 VIEW COMPARISON:  02/06/2021 FINDINGS: Lungs are clear. No pneumothorax or pleural effusion. Coronary artery bypass grafting has been performed. Mild cardiomegaly is stable. Pulmonary vascularity is normal. No acute bone abnormality. IMPRESSION: No active cardiopulmonary disease.  Stable cardiomegaly Electronically Signed   By: Fidela Salisbury M.D.   On: 05/06/2021 22:59     Assessment and Plan:   Acute on Chronic CHF with reduced EF: Secondary to medication noncompliance. Patient presented complaining of orthopnea, dyspnea on exertion with gross peripheral edema. BNP >4500 in the ED. Most recent echo was 12/24/2020 and showed LVEF 10-15% (down from 25-30% on 07/15/2020), severe global hypokinesis,septal akinesis. Patient is followed by cardiology advanced heart failure.  - Continue strict I/Os and daily weights  - Echocardiogram ordered by primary team. Can be discontinued as it will not change management plan.  - Continue Farxiga 10 mg, entresto 49-51 mg, digoxin 125 mcg, spironolactone 25 mg - Continue lasix 40 mg IV daily, consider increasing to BID as K allows (creatinine 0.95, monitor renal function during diuresis)   Medication noncompliance: Patient reports that he does not take his medications because he cannot read the labels. Takes his medications only when someone helps him. Serum digoxin  <0.2. Patient reports that there is usually someone who comes to his home to help him with his medications, but she was gone for a few weeks and he did not take his medications during that time.  - Consult social work.  Make sure he is still established with paramedicine   Hypokalemia: K 3.4 on admission>> 3.4 on 12/19  - Closely monitor as patient is on entresto and IV lasix  - Replete until K>4   CAD: s/p CABG 2017, LHC 06/2018  with occluded LAD normal LCX and RCA. LIMA to LAD and SVG to D2 patent (SVG to D1 occluded). HSTN 90 - Patient does not have chest pain at this time.  - HSTN likely elevated in the setting of acute on chronic HF with fluid overload  - Continue aspirin 20m, coreg 3.1254m and Lipitor 40 mg   Anemia: Hemoglobin 12.9>>11.6. Baseline around 11. - Iron supplementation with ferrous sulfate 325 mg tablet  - Consider stool hemoccult if anemia continues to worsen   HTN:  - Patient will be on diuretics, entresto, and Coreg   Risk Assessment/Risk Scores:  { New York Heart Association (NYHA) Functional Class NYHA Class III   For questions or updates, please contact [facility] Please consult [email or web address] for contact info under    Signed, KaMargie BilletPA-C  05/08/2021 10:26 AM As above, patient seen and examined.  Briefly he is a [age]ear old male with past medical history of chronic systolic congestive heart failure, coronary artery disease, noncompliance for evaluation of acute on chronic systolic congestive heart failure.  Patient does not read.  He has not taken his  medications in the past 2 months as he has had no one to fill his pillbox.  He developed increasing dyspnea and bilateral lower extremity edema and was admitted.  Cardiology now asked to evaluate.  Note last echocardiogram August 2022 showed ejection fraction 10 to 15%, severe RV dysfunction, biatrial enlargement, moderate to severe aortic insufficiency, moderate to severe mitral regurgitation  and moderate tricuspid regurgitation.  He is significantly volume overloaded on examination though improved with diuresis.  Creatinine 0.95, potassium 3.4, BNP greater than 4500, electrocardiogram shows sinus rhythm with left bundle branch block. 1 acute on chronic systolic congestive heart failure-markedly volume overloaded on examination.  Increase Lasix to 40 mg IV twice daily and continue spironolactone.  Continue Farxiga.  Follow potassium and renal function.  We discussed importance of compliance with medications and will likely need social services at discharge (we cannot read and wanting someone to assist with his medications).  2 coronary artery disease-continue aspirin and statin.  3 left bundle branch block-likely needs to arrange evaluation with electrophysiology following discharge for consideration of CRT-D.  4 cardiomyopathy-continue present dose of Entresto.  He is also on low-dose carvedilol and digoxin.  We will advance carvedilol later as CHF improves and as blood pressure/pulse allow.  5 Valvular heart disease-await results of follow-up echocardiogram.  Follow-up in CHF clinic.  Kirk Ruths, MD

## 2021-05-08 NOTE — Plan of Care (Signed)
Problem: Clinical Measurements: °Goal: Will remain free from infection °Outcome: Progressing °Goal: Respiratory complications will improve °Outcome: Progressing °  °Problem: Activity: °Goal: Risk for activity intolerance will decrease °Outcome: Progressing °  °Problem: Nutrition: °Goal: Adequate nutrition will be maintained °Outcome: Progressing °  °Problem: Coping: °Goal: Level of anxiety will decrease °Outcome: Completed/Met °  °

## 2021-05-08 NOTE — Progress Notes (Deleted)
PROGRESS NOTE  Jeremy Powers ZOX:[id number] DOB: Jan 27, 1956 DOA: 05/06/2021 PCP: [name], PA-C   LOS: 0 days   Brief narrative:  Jeremy Powers is a 65 y.o. male with past medical history of chronic systolic and diastolic heart failure, hypertension, hyperlipidemia, iron deficiency anemia,  history of coronary artery disease status post PCI and CABG  presented to the hospital with increasing shortness of breath, fatigue.  His latest ejection fraction from 4 months back was estimated at 10 to 15%.    Patient states that he does have plenty of medications at home including refills but he has not been taking them because of difficulty reading the labels.   He stated that he is not been taking his medication for the last 1 month.   In the ED, patient had blood pressure 147/79 on presentation with a heart rate of 101.  Patient was afebrile.  Initial labs showed a hypokalemia with a potassium of 3.4.  Creatinine was 0.9.  Digoxin level was noted to be less than 0.2.  BNP was elevated at 4500.  COVID and influenza was negative.  Patient was then considered for admission to the hospital for decompensated heart failure.   Assessment/Plan:  Principal Problem:   Acute on chronic systolic CHF (congestive heart failure) (HCC) Active Problems:   Hyperlipidemia   Iron deficiency anemia Hypokalemia Hypomagnesemia  Acute on chronic systolic heart failure.  Patient has orthopnea, dyspnea on exertion with gross peripheral edema.  BNP on presentation more than 4500. Patient is noncompliant to medication due to lack of inability to read the labels.  Patient has advanced heart failure and follows up with cardiology as outpatient, last visit October 2022. we will continue strict intake and output charting and daily weights, CHF protocol.  Patient will need TOC assistance on discharge, PT OT evaluation. Check 2D echocardiogram at this time.  Continue Clifton Custard, digoxin, Spironolactone and Lasix IV daily.   Continue potassium supplement.  Digoxin level was subtherapeutic.  Advanced heart failure team has been consulted.  Continue fluid restriction.  Cardiology has been notified   Elevated troponin.  No chest pain.  Patient appears to be volume overload.  Likely secondary to decompensated heart failure.  Patient has severe systolic dysfunction.  Check 2D echocardiogram.   History of coronary artery disease status post CABG.  No acute chest pain at this time.  EKG with left bundle branch block.  Unchanged from prior.   Mild hypokalemia.  Continue to replenish potassium.  Closely monitor on Entresto.  We will closely monitor on diuretics, spironolactone, entresto  Mild hypomagnesemia.  We will replenish through IV.  Magnesium of 1.5 today.  Add oral magnesium oxide as well.   Essential hypertension.   on IV diuretics and Entresto.  Continue Coreg.  DVT prophylaxis: enoxaparin (LOVENOX) injection 40 mg Start: 05/07/21 2200   Code Status: Full code  Family Communication: Spoke with the patient at bedside.  Status is: Observation  The patient will require care spanning > 2 midnights and should be moved to inpatient because: Decompensated heart failure, IV diuretics   Consultants: Cardiology  Procedures: None  Anti-infectives:  None  Anti-infectives (From admission, onward)    None       Subjective: Today, patient was seen and examined at bedside.  Patient denies any chest pain, dizziness, lightheadedness.  Feels little better with breathing.  States that he has been urinating okay  Objective: Vitals:   05/08/21 0131 05/08/21 0447  BP: 127/69 (!) 118/51  Pulse: 77 72  Resp: 18 18  Temp: 98.2 F (36.8 C) 98.4 F (36.9 C)  SpO2: 96% 98%    Intake/Output Summary (Last 24 hours) at 05/08/2021 0730 Last data filed at 05/07/2021 0852 Gross per 24 hour  Intake --  Output 200 ml  Net -200 ml   Filed Weights   05/07/21 1702 05/08/21 0450  Weight: 67.7 kg 66.5 kg    Body mass index is 25.16 kg/m.   Physical Exam:  GENERAL: Patient is alert awake and oriented. Not in obvious distress. HENT: No scleral pallor or icterus. Pupils equally reactive to light. Oral mucosa is moist NECK: is supple, no gross swelling noted. CHEST: Diminished breath sounds bilaterally.  Minimal crackles of the bases CVS: S1 and S2 heard, no murmur. Regular rate and rhythm.  ABDOMEN: Soft, non-tender, bowel sounds are present. EXTREMITIES: Bilateral lower extremity and gross pitting edema up to the thigh CNS: Cranial nerves are intact. No focal motor deficits. SKIN: warm and dry without rashes.  Data Review: I have personally reviewed the following laboratory data and studies,  CBC: Recent Labs  Lab 05/06/21 2159 05/08/21 0423  WBC 4.2 4.7  HGB 12.9* 11.6*  HCT 39.6 34.2*  MCV 91.0 87.5  PLT 207 202   Basic Metabolic Panel: Recent Labs  Lab 05/06/21 2159 05/08/21 0423  NA 137 139  K 3.4* 3.4*  CL 106 107  CO2 18* 24  GLUCOSE 93 99  BUN 14 14  CREATININE 0.93 0.95  CALCIUM 8.6* 8.3*  MG  --  1.5*  PHOS  --  2.7   Liver Function Tests: Recent Labs  Lab 05/06/21 2159 05/08/21 0423  AST 101* 136*  ALT 33 55*  ALKPHOS 117 94  BILITOT 1.5* 2.4*  PROT 8.3* 6.9  ALBUMIN 3.5 2.8*   No results for input(s): LIPASE, AMYLASE in the last 168 hours. No results for input(s): AMMONIA in the last 168 hours. Cardiac Enzymes: No results for input(s): CKTOTAL, CKMB, CKMBINDEX, TROPONINI in the last 168 hours. BNP (last 3 results) Recent Labs    12/23/20 1753 01/13/21 1013 05/06/21 2159  BNP >4,500.0* 2,527.5* >4,500.0*    ProBNP (last 3 results) Recent Labs    12/23/20 1217 02/06/21 1009  PROBNP >4833.0* 3,234.0*    CBG: No results for input(s): GLUCAP in the last 168 hours. Recent Results (from the past 240 hour(s))  Resp Panel by RT-PCR (Flu A&B, Covid) Nasopharyngeal Swab     Status: None   Collection Time: 05/06/21 11:13 PM   Specimen:  Nasopharyngeal Swab; Nasopharyngeal(NP) swabs in vial transport medium  Result Value Ref Range Status   SARS Coronavirus 2 by RT PCR NEGATIVE NEGATIVE Final    Comment: (NOTE) SARS-CoV-2 target nucleic acids are NOT DETECTED.  The SARS-CoV-2 RNA is generally detectable in upper respiratory specimens during the acute phase of infection. The lowest concentration of SARS-CoV-2 viral copies this assay can detect is 138 copies/mL. A negative result does not preclude SARS-Cov-2 infection and should not be used as the sole basis for treatment or other patient management decisions. A negative result may occur with  improper specimen collection/handling, submission of specimen other than nasopharyngeal swab, presence of viral mutation(s) within the areas targeted by this assay, and inadequate number of viral copies(<138 copies/mL). A negative result must be combined with clinical observations, patient history, and epidemiological information. The expected result is Negative.  Fact Sheet for Patients:  BloggerCourse.com  Fact Sheet for Healthcare Providers:  SeriousBroker.it  This test  is no t yet approved or cleared by the [place] and  has been authorized for detection and/or diagnosis of SARS-CoV-2 by FDA under an Emergency Use Authorization (EUA). This EUA will remain  in effect (meaning this test can be used) for the duration of the COVID-19 declaration under Section 564(b)(1) of the Act, 21 U.S.C.section 360bbb-3(b)(1), unless the authorization is terminated  or revoked sooner.       Influenza A by PCR NEGATIVE NEGATIVE Final   Influenza B by PCR NEGATIVE NEGATIVE Final    Comment: (NOTE) The Xpert Xpress SARS-CoV-2/FLU/RSV plus assay is intended as an aid in the diagnosis of influenza from Nasopharyngeal swab specimens and should not be used as a sole basis for treatment. Nasal washings and aspirates are unacceptable for  Xpert Xpress SARS-CoV-2/FLU/RSV testing.  Fact Sheet for Patients: BloggerCourse.com  Fact Sheet for Healthcare Providers: SeriousBroker.it  This test is not yet approved or cleared by the [place] FDA and has been authorized for detection and/or diagnosis of SARS-CoV-2 by FDA under an Emergency Use Authorization (EUA). This EUA will remain in effect (meaning this test can be used) for the duration of the COVID-19 declaration under Section 564(b)(1) of the Act, 21 U.S.C. section 360bbb-3(b)(1), unless the authorization is terminated or revoked.  Performed at [facility], [address] [place], [place] [address]      Studies: DG Chest 2 View  Result Date: 05/06/2021 CLINICAL DATA:  Dyspnea EXAM: CHEST - 2 VIEW COMPARISON:  02/06/2021 FINDINGS: Lungs are clear. No pneumothorax or pleural effusion. Coronary artery bypass grafting has been performed. Mild cardiomegaly is stable. Pulmonary vascularity is normal. No acute bone abnormality. IMPRESSION: No active cardiopulmonary disease.  Stable cardiomegaly Electronically Signed   By: Helyn Numbers M.D.   On: 05/06/2021 22:59      Joycelyn Das, MD  [facility] 05/08/2021  If 7PM-7AM, please contact night-coverage

## 2021-05-08 NOTE — Progress Notes (Addendum)
PROGRESS NOTE  Jeremy Powers KXF:[id number] DOB: 1955/11/06 DOA: 05/06/2021 PCP: [name], PA-C   LOS: 0 days   Brief narrative:  Jeremy Powers is a 65 y.o. male with past medical history of chronic systolic and diastolic heart failure, hypertension, hyperlipidemia, iron deficiency anemia,  history of coronary artery disease status post PCI and CABG  presented to the hospital with increasing shortness of breath, fatigue.  His latest ejection fraction from 4 months back was estimated at 10 to 15%.    Patient states that he does have plenty of medications at home including refills but he has not been taking them because of difficulty reading the labels.   He stated that he is not been taking his medication for the last 1 month.   In the ED, patient had blood pressure 147/79 on presentation with a heart rate of 101.  Patient was afebrile.  Initial labs showed a hypokalemia with a potassium of 3.4.  Creatinine was 0.9.  Digoxin level was noted to be less than 0.2.  BNP was elevated at 4500.  COVID and influenza was negative.  Patient was then considered for admission to the hospital for decompensated heart failure.   Assessment/Plan:  Principal Problem:   Acute on chronic systolic CHF (congestive heart failure) (HCC) Active Problems:   Hyperlipidemia   Iron deficiency anemia Hypokalemia Hypomagnesemia  Acute on chronic systolic heart failure.  Patient has orthopnea, dyspnea on exertion with gross peripheral edema.  BNP on presentation more than 4500. Patient is noncompliant to medication due to lack of inability to read the labels.  Patient has advanced heart failure and follows up with cardiology as outpatient, last visit October 2022. we will continue strict intake and output charting and daily weights, CHF protocol.  Patient will need TOC assistance on discharge, PT OT evaluation. Check 2D echocardiogram at this time.  Continue Jeremy Powers, digoxin, Spironolactone and Lasix IV daily.   Continue potassium supplement.  Digoxin level was subtherapeutic.  Advanced heart failure team has been consulted.  Continue fluid restriction.  Cardiology has been notified   Elevated troponin.  No chest pain.  Patient appears to be volume overload.  Likely secondary to decompensated heart failure.  Patient has severe systolic dysfunction.  Check 2D echocardiogram.   History of coronary artery disease status post CABG.  No acute chest pain at this time.  EKG with left bundle branch block.  Unchanged from prior.   Mild hypokalemia.  Continue to replenish potassium.  Closely monitor on Entresto.  We will closely monitor on diuretics, spironolactone, entresto  Mild hypomagnesemia.  We will replenish through IV.  Magnesium of 1.5 today.  Add oral magnesium oxide as well.   Essential hypertension.   on IV diuretics and Entresto.  Continue Coreg.  Disposition likely home with home health.  Patient might need home health RN to supervise his medications on discharge.  Patient does have plenty of medications at home please do not  prescribe new medication unless its not on the home med list.  DVT prophylaxis: enoxaparin (LOVENOX) injection 40 mg Start: 05/07/21 2200   Code Status: Full code  Family Communication: Spoke with the patient at bedside.  Status is: Observation  The patient will require care spanning > 2 midnights and should be moved to inpatient because: Decompensated heart failure, IV diuretics   Consultants: Cardiology  Procedures: None  Anti-infectives:  None  Anti-infectives (From admission, onward)    None       Subjective: Today,  patient was seen and examined at bedside.  Patient denies any chest pain, dizziness, lightheadedness.  Feels little better with breathing.  States that he has been urinating okay  Objective: Vitals:   05/08/21 0447 05/08/21 1012  BP: (!) 118/51 130/61  Pulse: 72 71  Resp: 18 16  Temp: 98.4 F (36.9 C)   SpO2: 98% 98%   No  intake or output data in the 24 hours ending 05/08/21 1150  Filed Weights   05/07/21 1702 05/08/21 0450  Weight: 67.7 kg 66.5 kg   Body mass index is 25.16 kg/m.   Physical Exam:  GENERAL: Patient is alert awake and oriented. Not in obvious distress. HENT: No scleral pallor or icterus. Pupils equally reactive to light. Oral mucosa is moist NECK: is supple, no gross swelling noted. CHEST: Diminished breath sounds bilaterally.  Minimal crackles of the bases CVS: S1 and S2 heard, no murmur. Regular rate and rhythm.  ABDOMEN: Soft, non-tender, bowel sounds are present. EXTREMITIES: Bilateral lower extremity and gross pitting edema up to the thigh CNS: Cranial nerves are intact. No focal motor deficits. SKIN: warm and dry without rashes.  Data Review: I have personally reviewed the following laboratory data and studies,  CBC: Recent Labs  Lab 05/06/21 2159 05/08/21 0423  WBC 4.2 4.7  HGB 12.9* 11.6*  HCT 39.6 34.2*  MCV 91.0 87.5  PLT 207 202    Basic Metabolic Panel: Recent Labs  Lab 05/06/21 2159 05/08/21 0423  NA 137 139  K 3.4* 3.4*  CL 106 107  CO2 18* 24  GLUCOSE 93 99  BUN 14 14  CREATININE 0.93 0.95  CALCIUM 8.6* 8.3*  MG  --  1.5*  PHOS  --  2.7    Liver Function Tests: Recent Labs  Lab 05/06/21 2159 05/08/21 0423  AST 101* 136*  ALT 33 55*  ALKPHOS 117 94  BILITOT 1.5* 2.4*  PROT 8.3* 6.9  ALBUMIN 3.5 2.8*    No results for input(s): LIPASE, AMYLASE in the last 168 hours. No results for input(s): AMMONIA in the last 168 hours. Cardiac Enzymes: No results for input(s): CKTOTAL, CKMB, CKMBINDEX, TROPONINI in the last 168 hours. BNP (last 3 results) Recent Labs    12/23/20 1753 01/13/21 1013 05/06/21 2159  BNP >4,500.0* 2,527.5* >4,500.0*     ProBNP (last 3 results) Recent Labs    12/23/20 1217 02/06/21 1009  PROBNP >4833.0* 3,234.0*     CBG: No results for input(s): GLUCAP in the last 168 hours. Recent Results (from the past  240 hour(s))  Resp Panel by RT-PCR (Flu A&B, Covid) Nasopharyngeal Swab     Status: None   Collection Time: 05/06/21 11:13 PM   Specimen: Nasopharyngeal Swab; Nasopharyngeal(NP) swabs in vial transport medium  Result Value Ref Range Status   SARS Coronavirus 2 by RT PCR NEGATIVE NEGATIVE Final    Comment: (NOTE) SARS-CoV-2 target nucleic acids are NOT DETECTED.  The SARS-CoV-2 RNA is generally detectable in upper respiratory specimens during the acute phase of infection. The lowest concentration of SARS-CoV-2 viral copies this assay can detect is 138 copies/mL. A negative result does not preclude SARS-Cov-2 infection and should not be used as the sole basis for treatment or other patient management decisions. A negative result may occur with  improper specimen collection/handling, submission of specimen other than nasopharyngeal swab, presence of viral mutation(s) within the areas targeted by this assay, and inadequate number of viral copies(<138 copies/mL). A negative result must be combined with clinical observations, patient  history, and epidemiological information. The expected result is Negative.  Fact Sheet for Patients:  BloggerCourse.com  Fact Sheet for Healthcare Providers:  SeriousBroker.it  This test is no t yet approved or cleared by the [place] FDA and  has been authorized for detection and/or diagnosis of SARS-CoV-2 by FDA under an Emergency Use Authorization (EUA). This EUA will remain  in effect (meaning this test can be used) for the duration of the COVID-19 declaration under Section 564(b)(1) of the Act, 21 U.S.C.section 360bbb-3(b)(1), unless the authorization is terminated  or revoked sooner.       Influenza A by PCR NEGATIVE NEGATIVE Final   Influenza B by PCR NEGATIVE NEGATIVE Final    Comment: (NOTE) The Xpert Xpress SARS-CoV-2/FLU/RSV plus assay is intended as an aid in the diagnosis of influenza  from Nasopharyngeal swab specimens and should not be used as a sole basis for treatment. Nasal washings and aspirates are unacceptable for Xpert Xpress SARS-CoV-2/FLU/RSV testing.  Fact Sheet for Patients: BloggerCourse.com  Fact Sheet for Healthcare Providers: SeriousBroker.it  This test is not yet approved or cleared by the [place] FDA and has been authorized for detection and/or diagnosis of SARS-CoV-2 by FDA under an Emergency Use Authorization (EUA). This EUA will remain in effect (meaning this test can be used) for the duration of the COVID-19 declaration under Section 564(b)(1) of the Act, 21 U.S.C. section 360bbb-3(b)(1), unless the authorization is terminated or revoked.  Performed at [facility], [address] [place], [place] [address]       Studies: DG Chest 2 View  Result Date: 05/06/2021 CLINICAL DATA:  Dyspnea EXAM: CHEST - 2 VIEW COMPARISON:  02/06/2021 FINDINGS: Lungs are clear. No pneumothorax or pleural effusion. Coronary artery bypass grafting has been performed. Mild cardiomegaly is stable. Pulmonary vascularity is normal. No acute bone abnormality. IMPRESSION: No active cardiopulmonary disease.  Stable cardiomegaly Electronically Signed   By: Helyn Numbers M.D.   On: 05/06/2021 22:59      Joycelyn Das, MD  [facility] 05/08/2021  If 7PM-7AM, please contact night-coverage

## 2021-05-08 NOTE — Progress Notes (Signed)
°  Echocardiogram 2D Echocardiogram has been performed.  Jeremy Powers 05/08/2021, 12:41 PM

## 2021-05-08 NOTE — Evaluation (Signed)
Physical Therapy Evaluation Patient Details Name: Jeremy Powers MRN: [id number] DOB: 1955/10/24 Today's Date: 05/08/2021  History of Present Illness  Patient is 65 y.o. male presented to the ED on 12/17 complaining of bilateral lower extremity edema, SOB for the past 1-2 weeks. Reported associated nausea and vomiting, cough. Denied chest pain. Patient is followed by cardiology and on several outpatient medications for CHF. However, the patient reported that he is unable to read and only takes his medications when there is someone to tell him what medications to take. PMH significant for systolic and diastolic CHF, HTN, HLD, iron deficiency anemia, hx of CAD PCI to LAD in 2015 and 2016, s/p CABG 2017.   Clinical Impression  Jeremy Powers is 65 y.o. male admitted with above HPI and diagnosis. Patient is currently limited by functional impairments below (see PT problem list). Patient lives with sig. other and reports independence with mobility but assist needed for IADL's at baseline. Currently patient required min assist/HHA for safety with gait and transfers and was able to ambulate ~300' total. Pt with generalized weakness and unable to complete sit<>stand without use of Ue's. Patient will benefit from continued skilled PT interventions to address impairments and progress independence with mobility, recommending HHPT. Acute PT will follow and progress as able.        Recommendations for follow up therapy are one component of a multi-disciplinary discharge planning process, led by the attending physician.  Recommendations may be updated based on patient status, additional functional criteria and insurance authorization.  Follow Up Recommendations Home health PT    Assistance Recommended at Discharge Intermittent Supervision/Assistance  Functional Status Assessment Patient has had a recent decline in their functional status and demonstrates the ability to make significant improvements in function in  a reasonable and predictable amount of time.  Equipment Recommendations  None recommended by PT    Recommendations for Other Services       Precautions / Restrictions Precautions Precautions: Fall Restrictions Weight Bearing Restrictions: No      Mobility  Bed Mobility               General bed mobility comments: pt OOB in recliner    Transfers Overall transfer level: Needs assistance Equipment used: None Transfers: Sit to/from Stand Sit to Stand: Min guard;Supervision           General transfer comment: cue for hand placement with RW, pt completed power up from Recliner with bil UE use for power up. pt able to complete with RW for standing balance as well. pt unable to rise without UE use.    Ambulation/Gait Ambulation/Gait assistance: Min guard;Min assist Gait Distance (Feet): 200 Feet Assistive device: Rolling walker (2 wheels) Gait Pattern/deviations: Step-to pattern;Decreased stride length Gait velocity: decr     General Gait Details: pt attempted gait with RW for support but keep RW too far forward and balance improved with no AD and a1HHA. HHA provided initially and progressed to no UE support, pt with slight drift and bil knees flexed in stance as pt fatigued.  Stairs            Wheelchair Mobility    Modified Rankin (Stroke Patients Only)       Balance Overall balance assessment: Needs assistance;Mild deficits observed, not formally tested Sitting-balance support: Feet supported Sitting balance-Leahy Scale: Good     Standing balance support: During functional activity;Bilateral upper extremity supported;No upper extremity supported;Single extremity supported Standing balance-Leahy Scale: Good  Pertinent Vitals/Pain      Home Living Family/patient expects to be discharged to:: Private residence Living Arrangements: Spouse/significant other Available Help at Discharge:  Friend(s);Neighbor Type of Home: Apartment Home Access: Level entry       Home Layout: One level Home Equipment: None Additional Comments: pt reports his girlfriend is at home with him.    Prior Function Prior Level of Function : Independent/Modified Independent             Mobility Comments: and does the homemaking and grocery shopping. States she helps with his medications.       Hand Dominance   Dominant Hand: Right    Extremity/Trunk Assessment   Upper Extremity Assessment Upper Extremity Assessment: Overall WFL for tasks assessed    Lower Extremity Assessment Lower Extremity Assessment: Generalized weakness (5xSit<>Stand: 37.07 secs with bil UE use)    Cervical / Trunk Assessment Cervical / Trunk Assessment: Normal  Communication   Communication: No difficulties  Cognition Arousal/Alertness: Awake/alert Behavior During Therapy: WFL for tasks assessed/performed Overall Cognitive Status: Within Functional Limits for tasks assessed                                          General Comments      Exercises     Assessment/Plan    PT Assessment Patient needs continued PT services  PT Problem List Decreased strength;Decreased activity tolerance;Decreased balance;Decreased mobility;Decreased knowledge of use of DME;Decreased knowledge of precautions       PT Treatment Interventions DME instruction;Gait training;Stair training;Functional mobility training;Therapeutic activities;Balance training;Therapeutic exercise;Neuromuscular re-education;Patient/family education    PT Goals (Current goals can be found in the Care Plan section)  Acute Rehab PT Goals Patient Stated Goal: get back home PT Goal Formulation: With patient Time For Goal Achievement: 05/22/21 Potential to Achieve Goals: Good    Frequency Min 3X/week   Barriers to discharge        Co-evaluation               AM-PAC PT "6 Clicks" Mobility  Outcome Measure Help  needed turning from your back to your side while in a flat bed without using bedrails?: A Little Help needed moving from lying on your back to sitting on the side of a flat bed without using bedrails?: A Little Help needed moving to and from a bed to a chair (including a wheelchair)?: A Little Help needed standing up from a chair using your arms (e.g., wheelchair or bedside chair)?: A Little Help needed to walk in hospital room?: A Little Help needed climbing 3-5 steps with a railing? : A Lot 6 Click Score: 17    End of Session Equipment Utilized During Treatment: Gait belt Activity Tolerance: Patient tolerated treatment well Patient left: in chair;with call bell/phone within reach;with chair alarm set Nurse Communication: Mobility status PT Visit Diagnosis: Muscle weakness (generalized) (M62.81);Difficulty in walking, not elsewhere classified (R26.2)    Time: 8502-7741 PT Time Calculation (min) (ACUTE ONLY): 27 min   Charges:   PT Evaluation $PT Eval Low Complexity: 1 Low PT Treatments $Gait Training: 8-22 mins        [name], DPT Acute Rehabilitation Services Office [phone] Pager [phone]   [name] 05/08/2021, 1:08 PM

## 2021-05-09 LAB — COMPREHENSIVE METABOLIC PANEL
ALT: 45 U/L — ABNORMAL HIGH (ref 0–44)
AST: 98 U/L — ABNORMAL HIGH (ref 15–41)
Albumin: 2.7 g/dL — ABNORMAL LOW (ref 3.5–5.0)
Alkaline Phosphatase: 90 U/L (ref 38–126)
Anion gap: 9 (ref 5–15)
BUN: 10 mg/dL (ref 8–23)
CO2: 27 mmol/L (ref 22–32)
Calcium: 8.3 mg/dL — ABNORMAL LOW (ref 8.9–10.3)
Chloride: 102 mmol/L (ref 98–111)
Creatinine, Ser: 1.03 mg/dL (ref 0.61–1.24)
GFR, Estimated: >60 mL/min (ref >60)
Glucose, Bld: 86 mg/dL (ref 70–99)
Potassium: 3.4 mmol/L — ABNORMAL LOW (ref 3.5–5.1)
Sodium: 138 mmol/L (ref 135–145)
Total Bilirubin: 1.8 mg/dL — ABNORMAL HIGH (ref 0.3–1.2)
Total Protein: 6.4 g/dL — ABNORMAL LOW (ref 6.5–8.1)

## 2021-05-09 LAB — CBC
HCT: 40.9 % (ref 39.0–52.0)
Hemoglobin: 13.7 g/dL (ref 13.0–17.0)
MCH: 30.3 pg (ref 26.0–34.0)
MCHC: 33.5 g/dL (ref 30.0–36.0)
MCV: 90.5 fL (ref 80.0–100.0)
Platelets: 224 10*3/uL (ref 150–400)
RBC: 4.52 MIL/uL (ref 4.22–5.81)
RDW: 18.8 % — ABNORMAL HIGH (ref 11.5–15.5)
WBC: 3.6 10*3/uL — ABNORMAL LOW (ref 4.0–10.5)
nRBC: 0 % (ref 0.0–0.2)

## 2021-05-09 LAB — MAGNESIUM: Magnesium: 1.6 mg/dL — ABNORMAL LOW (ref 1.7–2.4)

## 2021-05-09 LAB — PHOSPHORUS: Phosphorus: 2.2 mg/dL — ABNORMAL LOW (ref 2.5–4.6)

## 2021-05-09 MED ORDER — POTASSIUM CHLORIDE 20 MEQ PO PACK
40.0000 meq | PACK | Freq: Once | ORAL | Status: AC
  Administered 2021-05-09: 13:00:00 40 meq via ORAL
  Filled 2021-05-09: qty 2

## 2021-05-09 MED ORDER — MAGNESIUM SULFATE 2 GM/50ML IV SOLN
2.0000 g | Freq: Once | INTRAVENOUS | Status: AC
  Administered 2021-05-09: 10:00:00 2 g via INTRAVENOUS
  Filled 2021-05-09: qty 50

## 2021-05-09 NOTE — Progress Notes (Signed)
Progress Note  Patient Name: Jeremy Powers Date of Encounter: 05/09/2021  [facility] Cardiologist: Arvilla Meres, MD   Subjective   No CP or dyspnea  Inpatient Medications    Scheduled Meds:  aspirin EC  81 mg Oral Daily   atorvastatin  40 mg Oral QPM   carvedilol  3.125 mg Oral BID WC   dapagliflozin propanediol  10 mg Oral Q breakfast   digoxin  125 mcg Oral Daily   enoxaparin (LOVENOX) injection  40 mg Subcutaneous Q24H   furosemide  40 mg Intravenous BID   influenza vaccine adjuvanted  0.5 mL Intramuscular Tomorrow-1000   iron polysaccharides  150 mg Oral Daily   magnesium oxide  400 mg Oral BID   pneumococcal 23 valent vaccine  0.5 mL Intramuscular Tomorrow-1000   potassium chloride  40 mEq Oral Once   potassium chloride SA  40 mEq Oral Daily   sacubitril-valsartan  1 tablet Oral BID   sertraline  25 mg Oral Daily   sodium chloride flush  3 mL Intravenous Q12H   spironolactone  25 mg Oral Daily   Continuous Infusions:  sodium chloride     magnesium sulfate bolus IVPB     PRN Meds: sodium chloride, acetaminophen, ondansetron (ZOFRAN) IV, sodium chloride flush   Vital Signs    Vitals:   05/08/21 2103 05/09/21 0449 05/09/21 0500 05/09/21 0600  BP: (!) 100/52 (!) 117/56    Pulse: 63 64    Resp: 20 19    Temp: 98 F (36.7 C) 97.7 F (36.5 C)    TempSrc: Oral Oral    SpO2: 93% 91%  93%  Weight:   61.9 kg   Height:        Intake/Output Summary (Last 24 hours) at 05/09/2021 0904 Last data filed at 05/09/2021 0044 Gross per 24 hour  Intake 1180 ml  Output 4050 ml  Net -2870 ml   Last 3 Weights 05/09/2021 05/08/2021 05/07/2021  Weight (lbs) 136 lb 7.4 oz 146 lb 9.7 oz 149 lb 4 oz  Weight (kg) 61.9 kg 66.5 kg 67.7 kg      Telemetry    Sinus with 3 beats NSVT - Personally Reviewed   Physical Exam   GEN: No acute distress.   Neck: Positive JVD Cardiac: RRR Respiratory: Clear to auscultation bilaterally. GI: Soft, nontender,  non-distended  MS: 1+ edema Neuro:  Nonfocal  Psych: Normal affect   Labs    High Sensitivity Troponin:   Recent Labs  Lab 05/06/21 2159  TROPONINIHS 90*     Chemistry Recent Labs  Lab 05/06/21 2159 05/08/21 0423 05/09/21 0457  NA 137 139 138  K 3.4* 3.4* 3.4*  CL 106 107 102  CO2 18* 24 27  GLUCOSE 93 99 86  BUN 14 14 10   CREATININE 0.93 0.95 1.03  CALCIUM 8.6* 8.3* 8.3*  MG  --  1.5* 1.6*  PROT 8.3* 6.9 6.4*  ALBUMIN 3.5 2.8* 2.7*  AST 101* 136* 98*  ALT 33 55* 45*  ALKPHOS 117 94 90  BILITOT 1.5* 2.4* 1.8*  GFRNONAA >60 >60 >60  ANIONGAP 13 8 9      Hematology Recent Labs  Lab 05/06/21 2159 05/08/21 0423 05/09/21 0457  WBC 4.2 4.7 3.6*  RBC 4.35 3.91* 4.52  HGB 12.9* 11.6* 13.7  HCT 39.6 34.2* 40.9  MCV 91.0 87.5 90.5  MCH 29.7 29.7 30.3  MCHC 32.6 33.9 33.5  RDW 18.6* 18.4* 18.8*  PLT 207 202 224   BNP  Recent Labs  Lab 05/06/21 2159  BNP >4,500.0*      Radiology    ECHOCARDIOGRAM COMPLETE  Result Date: 05/08/2021    ECHOCARDIOGRAM REPORT   Patient Name:   [name] Date of Exam: 05/08/2021 Medical Rec #:  [id number]     Height:       64.0 in Accession #:    [id number]    Weight:       146.6 lb Date of Birth:  April 02, 1956     BSA:          1.714 m Patient Age:    15 years      BP:           118/51 mmHg Patient Gender: M             HR:           69 bpm. Exam Location:  Inpatient Procedure: 2D Echo, 3D Echo, Cardiac Doppler, Color Doppler and Intracardiac            Opacification Agent Indications:    CHF-Acute Systolic I50.21  History:        Patient has prior history of Echocardiogram examinations, most                 recent 12/24/2020. CHF, CAD; Risk Factors:Hypertension.  Sonographer:    Eulah Pont RDCS Referring Phys: [id number] [facility] POKHREL IMPRESSIONS  1. Left ventricular ejection fraction, by estimation, is 20 to 25%. The left ventricle has severely decreased function. The left ventricle demonstrates global hypokinesis. The left  ventricular internal cavity size was severely dilated. Left ventricular diastolic parameters are consistent with Grade II diastolic dysfunction (pseudonormalization). Elevated left atrial pressure.  2. Right ventricular systolic function is moderately reduced. The right ventricular size is mildly enlarged. There is mildly elevated pulmonary artery systolic pressure. The estimated right ventricular systolic pressure is 43.2 mmHg.  3. Left atrial size was mildly dilated.  4. Right atrial size was mildly dilated.  5. The mitral valve is normal in structure. Moderate mitral valve regurgitation.  6. The aortic valve was not well visualized. Aortic valve regurgitation is mild. No aortic stenosis is present.  7. The inferior vena cava is normal in size with greater than 50% respiratory variability, suggesting right atrial pressure of 3 mmHg. FINDINGS  Left Ventricle: Left ventricular ejection fraction, by estimation, is 20 to 25%. The left ventricle has severely decreased function. The left ventricle demonstrates global hypokinesis. Definity contrast agent was given IV to delineate the left ventricular endocardial borders. The left ventricular internal cavity size was severely dilated. There is no left ventricular hypertrophy. Left ventricular diastolic parameters are consistent with Grade II diastolic dysfunction (pseudonormalization). Elevated left atrial pressure. Right Ventricle: The right ventricular size is mildly enlarged. Right vetricular wall thickness was not well visualized. Right ventricular systolic function is moderately reduced. There is mildly elevated pulmonary artery systolic pressure. The tricuspid  regurgitant velocity is 3.17 m/s, and with an assumed right atrial pressure of 3 mmHg, the estimated right ventricular systolic pressure is 43.2 mmHg. Left Atrium: Left atrial size was mildly dilated. Right Atrium: Right atrial size was mildly dilated. Pericardium: There is no evidence of pericardial  effusion. Mitral Valve: The mitral valve is normal in structure. Moderate mitral valve regurgitation. Tricuspid Valve: The tricuspid valve is normal in structure. Tricuspid valve regurgitation is trivial. Aortic Valve: The aortic valve was not well visualized. Aortic valve regurgitation is mild. Aortic regurgitation PHT measures 475 msec. No aortic stenosis  is present. Pulmonic Valve: The pulmonic valve was not well visualized. Pulmonic valve regurgitation is not visualized. Aorta: The aortic root and ascending aorta are structurally normal, with no evidence of dilitation. Venous: The inferior vena cava is normal in size with greater than 50% respiratory variability, suggesting right atrial pressure of 3 mmHg. IAS/Shunts: The interatrial septum was not well visualized.  LEFT VENTRICLE PLAX 2D LVIDd:         7.10 cm      Diastology LVIDs:         5.80 cm      LV e' medial:    3.77 cm/s LV PW:         1.00 cm      LV E/e' medial:  27.6 LV IVS:        0.80 cm      LV e' lateral:   7.37 cm/s LVOT diam:     1.80 cm      LV E/e' lateral: 14.1 LV SV:         71 LV SV Index:   42 LVOT Area:     2.54 cm                              3D Volume EF: LV Volumes (MOD)            3D EF:        24 % LV vol d, MOD A2C: 367.0 ml LV EDV:       375 ml LV vol d, MOD A4C: 292.0 ml LV ESV:       284 ml LV vol s, MOD A2C: 234.0 ml LV SV:        91 ml LV vol s, MOD A4C: 230.0 ml LV SV MOD A2C:     133.0 ml LV SV MOD A4C:     292.0 ml LV SV MOD BP:      104.6 ml RIGHT VENTRICLE RV S prime:     5.80 cm/s TAPSE (M-mode): 1.4 cm LEFT ATRIUM             Index        RIGHT ATRIUM           Index LA diam:        5.10 cm 2.97 cm/m   RA Area:     21.20 cm LA Vol (A2C):   69.6 ml 40.60 ml/m  RA Volume:   66.70 ml  38.90 ml/m LA Vol (A4C):   58.6 ml 34.18 ml/m LA Biplane Vol: 63.1 ml 36.80 ml/m  AORTIC VALVE LVOT Vmax:   161.00 cm/s LVOT Vmean:  102.000 cm/s LVOT VTI:    0.280 m AI PHT:      475 msec  AORTA Ao Root diam: 3.20 cm Ao Asc diam:   2.80 cm MITRAL VALVE                  TRICUSPID VALVE MV Area (PHT): 3.91 cm       TV Peak grad:   20.8 mmHg MV Decel Time: 194 msec       TV Vmax:        2.28 m/s MR Peak grad:    82.8 mmHg    TR Peak grad:   40.2 mmHg MR Mean grad:    51.0 mmHg    TR Vmax:        317.00 cm/s MR Vmax:  455.00 cm/s MR Vmean:        333.0 cm/s   SHUNTS MR PISA:         1.01 cm     Systemic VTI:  0.28 m MR PISA Eff ROA: 9 mm        Systemic Diam: 1.80 cm MR PISA Radius:  0.40 cm MV E velocity: 104.00 cm/s MV A velocity: 96.00 cm/s MV E/A ratio:  1.08 Epifanio Lesches MD Electronically signed by Epifanio Lesches MD Signature Date/Time: 05/08/2021/1:38:09 PM    Final      Patient Profile     65 year old male with past medical history of chronic systolic congestive heart failure, coronary artery disease, noncompliance for evaluation of acute on chronic systolic congestive heart failure. Patient does not read.  He has not taken his medications in the past 2 months as he has had no one to fill his pillbox.   Assessment & Plan    1 acute on chronic systolic congestive heart failure-I/O -2720. Wt 61.9 kg.  Significant improvement but remains volume overloaded.  We will continue Lasix and spironolactone.  Follow potassium and renal function.  Will likely decrease Lasix tomorrow.  Continue Farxiga.  We discussed importance of compliance with medications and will likely need social services at discharge (we cannot read and wanting someone to assist with his medications).   2 coronary artery disease-continue aspirin and statin.   3 left bundle branch block-need to arrange evaluation with electrophysiology following discharge for consideration of CRT-D.   4 cardiomyopathy-continue present dose of Entresto.  He is also on low-dose carvedilol and digoxin.  We will advance carvedilol later as CHF improves and as blood pressure/pulse allow.   5 Valvular heart disease-await results of follow-up echocardiogram.   Follow-up in CHF clinic.  For questions or updates, please contact [facility] Please consult [email or web address] for contact info under        Signed, Olga Millers, MD  05/09/2021, 9:04 AM

## 2021-05-09 NOTE — Plan of Care (Signed)
  Problem: Nutrition: Goal: Adequate nutrition will be maintained Outcome: Progressing   

## 2021-05-09 NOTE — Progress Notes (Signed)
PROGRESS NOTE    Jeremy Powers  DDU:[id number] DOB: [birth year] DOA: 05/06/2021 PCP: [name], PA-C    Brief Narrative: This 65 y.o. male with PMH significant for chronic systolic and diastolic heart failure, hypertension, hyperlipidemia, iron deficiency anemia,  history of coronary artery disease status post PCI and CABG  presented to the hospital with increasing shortness of breath, fatigue.  His latest ejection fraction from 4 months back was estimated at 10 to 15%.  Patient states that he does have plenty of medications at home including refills but he has not been taking them because of difficulty reading the labels. He states that he is not been taking his medication for the last 1 month.   In the ED, patient had blood pressure 147/79 on presentation with heart rate of 101.  Patient was afebrile.  Initial labs showed a hypokalemia with a potassium of 3.4.  Creatinine was 0.9.  Digoxin level was noted to be less than 0.2.  BNP was elevated at 4500.  COVID and influenza was negative.  Patient was then considered for admission to the hospital for decompensated heart failure.   Assessment & Plan:   Principal Problem:   Acute on chronic systolic CHF (congestive heart failure) (HCC) Active Problems:   Hyperlipidemia   Iron deficiency anemia   CHF (congestive heart failure) (HCC)   Acute on chronic systolic heart failure: Patient presented with orthopnea, SOB on exertion with gross peripheral edema. BNP on presentation more than 4500.   Patient has been noncompliant with medication due to inability to read the labels. Patient has advanced heart failure and follows up with cardiology as outpatient, last visit October 2022.  Continue with strict intake output charting and daily weights.  CHF protocol. 2D  Echo: LVEF slightly improved to 20 to 25% , still severely reduced. Continue Clifton Custard, digoxin, Spironolactone and Lasix IV daily.  Digoxin level was subtherapeutic.   Advanced heart failure team has been consulted.   Continue fluid restriction.  Cardiologist recommended continue current management. Patient will need TOC assistance on discharge, PT OT evaluation.    Elevated troponin: Patient denies any chest pain, appears volume overloaded. Likely demand ischemia due to decompensated heart failure. Patient has severe systolic dysfunction.  2D echo shows improving LVEF. Continue aspirin and statin.  History of CAD s/p CABG:  No acute chest pain at this time.  EKG with left bundle branch block.  Unchanged from prior.   Hypokalemia: Replaced.  Continue to monitor  Hypomagnesemia: Replaced, Continue to monitor   Essential hypertension: Continue Entresto and IV Lasix.  Continue Coreg   Disposition likely home with home health.  Patient might need home health RN to supervise his medications on discharge.  Patient does have plenty of medications at home please do not  prescribe new medication unless its not on the home med list.   DVT prophylaxis: Lovenox Code Status: Full code Family Communication: No family at bedside Disposition Plan:   Status is: Inpatient  Remains inpatient appropriate because: Acute systolic CHF requiring IV Lasix.  Anticipated discharge home tomorrow.  Consultants:  Cardiology  Procedures: 2D echocardiogram Antimicrobials: None  Subjective: Patient was seen and examined at bedside.  Overnight events noted.  Patient sitting comfortably on the recliner watching television.  He states he is doing much better.  Objective: Vitals:   05/09/21 0500 05/09/21 0600 05/09/21 1207 05/09/21 1212  BP:   (!) 89/46 (!) 90/56  Pulse:   65 66  Resp:   16  Temp:   (!) 97.5 F (36.4 C)   TempSrc:   Oral   SpO2:  93% 97%   Weight: 61.9 kg     Height:        Intake/Output Summary (Last 24 hours) at 05/09/2021 1326 Last data filed at 05/09/2021 1101 Gross per 24 hour  Intake 1050 ml  Output 4050 ml  Net -3000 ml   Filed  Weights   05/07/21 1702 05/08/21 0450 05/09/21 0500  Weight: 67.7 kg 66.5 kg 61.9 kg    Examination:  General exam: Appears comfortable, not in any acute distress. Respiratory system: Diminished breath sounds bilaterally, bibasilar crackles. Cardiovascular system: S1-S2 heard, regular rate and rhythm, no murmur. Gastrointestinal system: Domine soft, nontender, nondistended, BS+ Central nervous system: Alert and oriented x 3. No focal neurological deficits. Extremities: Edema +, no cyanosis, no clubbing Skin: No rashes, lesions or ulcers Psychiatry: Judgement and insight appear normal. Mood & affect appropriate.     Data Reviewed: I have personally reviewed following labs and imaging studies  CBC: Recent Labs  Lab 05/06/21 2159 05/08/21 0423 05/09/21 0457  WBC 4.2 4.7 3.6*  HGB 12.9* 11.6* 13.7  HCT 39.6 34.2* 40.9  MCV 91.0 87.5 90.5  PLT 207 202 224   Basic Metabolic Panel: Recent Labs  Lab 05/06/21 2159 05/08/21 0423 05/09/21 0457  NA 137 139 138  K 3.4* 3.4* 3.4*  CL 106 107 102  CO2 18* 24 27  GLUCOSE 93 99 86  BUN 14 14 10   CREATININE 0.93 0.95 1.03  CALCIUM 8.6* 8.3* 8.3*  MG  --  1.5* 1.6*  PHOS  --  2.7 2.2*   GFR: Estimated Creatinine Clearance: 58.3 mL/min (by C-G formula based on SCr of 1.03 mg/dL). Liver Function Tests: Recent Labs  Lab 05/06/21 2159 05/08/21 0423 05/09/21 0457  AST 101* 136* 98*  ALT 33 55* 45*  ALKPHOS 117 94 90  BILITOT 1.5* 2.4* 1.8*  PROT 8.3* 6.9 6.4*  ALBUMIN 3.5 2.8* 2.7*   No results for input(s): LIPASE, AMYLASE in the last 168 hours. No results for input(s): AMMONIA in the last 168 hours. Coagulation Profile: No results for input(s): INR, PROTIME in the last 168 hours. Cardiac Enzymes: No results for input(s): CKTOTAL, CKMB, CKMBINDEX, TROPONINI in the last 168 hours. BNP (last 3 results) Recent Labs    12/23/20 1217 02/06/21 1009  PROBNP >4833.0* 3,234.0*   HbA1C: No results for input(s): HGBA1C in  the last 72 hours. CBG: No results for input(s): GLUCAP in the last 168 hours. Lipid Profile: No results for input(s): CHOL, HDL, LDLCALC, TRIG, CHOLHDL, LDLDIRECT in the last 72 hours. Thyroid Function Tests: No results for input(s): TSH, T4TOTAL, FREET4, T3FREE, THYROIDAB in the last 72 hours. Anemia Panel: No results for input(s): VITAMINB12, FOLATE, FERRITIN, TIBC, IRON, RETICCTPCT in the last 72 hours. Sepsis Labs: No results for input(s): PROCALCITON, LATICACIDVEN in the last 168 hours.  Recent Results (from the past 240 hour(s))  Resp Panel by RT-PCR (Flu A&B, Covid) Nasopharyngeal Swab     Status: None   Collection Time: 05/06/21 11:13 PM   Specimen: Nasopharyngeal Swab; Nasopharyngeal(NP) swabs in vial transport medium  Result Value Ref Range Status   SARS Coronavirus 2 by RT PCR NEGATIVE NEGATIVE Final    Comment: (NOTE) SARS-CoV-2 target nucleic acids are NOT DETECTED.  The SARS-CoV-2 RNA is generally detectable in upper respiratory specimens during the acute phase of infection. The lowest concentration of SARS-CoV-2 viral copies this assay can detect is  138 copies/mL. A negative result does not preclude SARS-Cov-2 infection and should not be used as the sole basis for treatment or other patient management decisions. A negative result may occur with  improper specimen collection/handling, submission of specimen other than nasopharyngeal swab, presence of viral mutation(s) within the areas targeted by this assay, and inadequate number of viral copies(<138 copies/mL). A negative result must be combined with clinical observations, patient history, and epidemiological information. The expected result is Negative.  Fact Sheet for Patients:  BloggerCourse.com  Fact Sheet for Healthcare Providers:  SeriousBroker.it  This test is no t yet approved or cleared by the [place] FDA and  has been authorized for detection  and/or diagnosis of SARS-CoV-2 by FDA under an Emergency Use Authorization (EUA). This EUA will remain  in effect (meaning this test can be used) for the duration of the COVID-19 declaration under Section 564(b)(1) of the Act, 21 U.S.C.section 360bbb-3(b)(1), unless the authorization is terminated  or revoked sooner.       Influenza A by PCR NEGATIVE NEGATIVE Final   Influenza B by PCR NEGATIVE NEGATIVE Final    Comment: (NOTE) The Xpert Xpress SARS-CoV-2/FLU/RSV plus assay is intended as an aid in the diagnosis of influenza from Nasopharyngeal swab specimens and should not be used as a sole basis for treatment. Nasal washings and aspirates are unacceptable for Xpert Xpress SARS-CoV-2/FLU/RSV testing.  Fact Sheet for Patients: BloggerCourse.com  Fact Sheet for Healthcare Providers: SeriousBroker.it  This test is not yet approved or cleared by the [place] FDA and has been authorized for detection and/or diagnosis of SARS-CoV-2 by FDA under an Emergency Use Authorization (EUA). This EUA will remain in effect (meaning this test can be used) for the duration of the COVID-19 declaration under Section 564(b)(1) of the Act, 21 U.S.C. section 360bbb-3(b)(1), unless the authorization is terminated or revoked.  Performed at [facility], [address] [place], [place] [address]     Radiology Studies: ECHOCARDIOGRAM COMPLETE  Result Date: 05/08/2021    ECHOCARDIOGRAM REPORT   Patient Name:   Jeremy Powers Date of Exam: 05/08/2021 Medical Rec #:  [id number]     Height:       64.0 in Accession #:    [id number]    Weight:       146.6 lb Date of Birth:  [birth year]     BSA:          1.714 m Patient Age:    69 years      BP:           118/51 mmHg Patient Gender: M             HR:           69 bpm. Exam Location:  Inpatient Procedure: 2D Echo, 3D Echo, Cardiac Doppler, Color Doppler and Intracardiac            Opacification  Agent Indications:    CHF-Acute Systolic I50.21  History:        Patient has prior history of Echocardiogram examinations, most                 recent 12/24/2020. CHF, CAD; Risk Factors:Hypertension.  Sonographer:    Eulah Pont RDCS Referring Phys: [id number] [facility] POKHREL IMPRESSIONS  1. Left ventricular ejection fraction, by estimation, is 20 to 25%. The left ventricle has severely decreased function. The left ventricle demonstrates global hypokinesis. The left ventricular internal cavity size was severely dilated. Left ventricular diastolic parameters  are consistent with Grade II diastolic dysfunction (pseudonormalization). Elevated left atrial pressure.  2. Right ventricular systolic function is moderately reduced. The right ventricular size is mildly enlarged. There is mildly elevated pulmonary artery systolic pressure. The estimated right ventricular systolic pressure is 43.2 mmHg.  3. Left atrial size was mildly dilated.  4. Right atrial size was mildly dilated.  5. The mitral valve is normal in structure. Moderate mitral valve regurgitation.  6. The aortic valve was not well visualized. Aortic valve regurgitation is mild. No aortic stenosis is present.  7. The inferior vena cava is normal in size with greater than 50% respiratory variability, suggesting right atrial pressure of 3 mmHg. FINDINGS  Left Ventricle: Left ventricular ejection fraction, by estimation, is 20 to 25%. The left ventricle has severely decreased function. The left ventricle demonstrates global hypokinesis. Definity contrast agent was given IV to delineate the left ventricular endocardial borders. The left ventricular internal cavity size was severely dilated. There is no left ventricular hypertrophy. Left ventricular diastolic parameters are consistent with Grade II diastolic dysfunction (pseudonormalization). Elevated left atrial pressure. Right Ventricle: The right ventricular size is mildly enlarged. Right vetricular wall thickness  was not well visualized. Right ventricular systolic function is moderately reduced. There is mildly elevated pulmonary artery systolic pressure. The tricuspid  regurgitant velocity is 3.17 m/s, and with an assumed right atrial pressure of 3 mmHg, the estimated right ventricular systolic pressure is 43.2 mmHg. Left Atrium: Left atrial size was mildly dilated. Right Atrium: Right atrial size was mildly dilated. Pericardium: There is no evidence of pericardial effusion. Mitral Valve: The mitral valve is normal in structure. Moderate mitral valve regurgitation. Tricuspid Valve: The tricuspid valve is normal in structure. Tricuspid valve regurgitation is trivial. Aortic Valve: The aortic valve was not well visualized. Aortic valve regurgitation is mild. Aortic regurgitation PHT measures 475 msec. No aortic stenosis is present. Pulmonic Valve: The pulmonic valve was not well visualized. Pulmonic valve regurgitation is not visualized. Aorta: The aortic root and ascending aorta are structurally normal, with no evidence of dilitation. Venous: The inferior vena cava is normal in size with greater than 50% respiratory variability, suggesting right atrial pressure of 3 mmHg. IAS/Shunts: The interatrial septum was not well visualized.  LEFT VENTRICLE PLAX 2D LVIDd:         7.10 cm      Diastology LVIDs:         5.80 cm      LV e' medial:    3.77 cm/s LV PW:         1.00 cm      LV E/e' medial:  27.6 LV IVS:        0.80 cm      LV e' lateral:   7.37 cm/s LVOT diam:     1.80 cm      LV E/e' lateral: 14.1 LV SV:         71 LV SV Index:   42 LVOT Area:     2.54 cm                              3D Volume EF: LV Volumes (MOD)            3D EF:        24 % LV vol d, MOD A2C: 367.0 ml LV EDV:       375 ml LV vol d, MOD A4C: 292.0 ml LV ESV:  284 ml LV vol s, MOD A2C: 234.0 ml LV SV:        91 ml LV vol s, MOD A4C: 230.0 ml LV SV MOD A2C:     133.0 ml LV SV MOD A4C:     292.0 ml LV SV MOD BP:      104.6 ml RIGHT VENTRICLE RV S  prime:     5.80 cm/s TAPSE (M-mode): 1.4 cm LEFT ATRIUM             Index        RIGHT ATRIUM           Index LA diam:        5.10 cm 2.97 cm/m   RA Area:     21.20 cm LA Vol (A2C):   69.6 ml 40.60 ml/m  RA Volume:   66.70 ml  38.90 ml/m LA Vol (A4C):   58.6 ml 34.18 ml/m LA Biplane Vol: 63.1 ml 36.80 ml/m  AORTIC VALVE LVOT Vmax:   161.00 cm/s LVOT Vmean:  102.000 cm/s LVOT VTI:    0.280 m AI PHT:      475 msec  AORTA Ao Root diam: 3.20 cm Ao Asc diam:  2.80 cm MITRAL VALVE                  TRICUSPID VALVE MV Area (PHT): 3.91 cm       TV Peak grad:   20.8 mmHg MV Decel Time: 194 msec       TV Vmax:        2.28 m/s MR Peak grad:    82.8 mmHg    TR Peak grad:   40.2 mmHg MR Mean grad:    51.0 mmHg    TR Vmax:        317.00 cm/s MR Vmax:         455.00 cm/s MR Vmean:        333.0 cm/s   SHUNTS MR PISA:         1.01 cm     Systemic VTI:  0.28 m MR PISA Eff ROA: 9 mm        Systemic Diam: 1.80 cm MR PISA Radius:  0.40 cm MV E velocity: 104.00 cm/s MV A velocity: 96.00 cm/s MV E/A ratio:  1.08 Epifanio Lesches MD Electronically signed by Epifanio Lesches MD Signature Date/Time: 05/08/2021/1:38:09 PM    Final     Scheduled Meds:  aspirin EC  81 mg Oral Daily   atorvastatin  40 mg Oral QPM   carvedilol  3.125 mg Oral BID WC   dapagliflozin propanediol  10 mg Oral Q breakfast   digoxin  125 mcg Oral Daily   enoxaparin (LOVENOX) injection  40 mg Subcutaneous Q24H   furosemide  40 mg Intravenous BID   influenza vaccine adjuvanted  0.5 mL Intramuscular Tomorrow-1000   iron polysaccharides  150 mg Oral Daily   magnesium oxide  400 mg Oral BID   pneumococcal 23 valent vaccine  0.5 mL Intramuscular Tomorrow-1000   potassium chloride SA  40 mEq Oral Daily   sacubitril-valsartan  1 tablet Oral BID   sertraline  25 mg Oral Daily   sodium chloride flush  3 mL Intravenous Q12H   spironolactone  25 mg Oral Daily   Continuous Infusions:  sodium chloride       LOS: 1 day    Time spent: 35  mins    Cipriano Bunker, MD [facility]   If 7PM-7AM, please  contact night-coverage

## 2021-05-09 NOTE — Plan of Care (Signed)
  Problem: Health Behavior/Discharge Planning: Goal: Ability to manage health-related needs will improve Outcome: Progressing   Problem: Clinical Measurements: Goal: Ability to maintain clinical measurements within normal limits will improve Outcome: Progressing Goal: Will remain free from infection Outcome: Progressing Goal: Diagnostic test results will improve Outcome: Progressing Goal: Respiratory complications will improve Outcome: Progressing Goal: Cardiovascular complication will be avoided Outcome: Progressing   Problem: Activity: Goal: Risk for activity intolerance will decrease Outcome: Progressing   Problem: Nutrition: Goal: Adequate nutrition will be maintained Outcome: Progressing   Problem: Elimination: Goal: Will not experience complications related to bowel motility Outcome: Progressing Goal: Will not experience complications related to urinary retention Outcome: Progressing   Problem: Pain Managment: Goal: General experience of comfort will improve Outcome: Progressing   Problem: Safety: Goal: Ability to remain free from injury will improve Outcome: Progressing   Problem: Skin Integrity: Goal: Risk for impaired skin integrity will decrease Outcome: Progressing   Problem: Education: Goal: Ability to demonstrate management of disease process will improve Outcome: Progressing Goal: Ability to verbalize understanding of medication therapies will improve Outcome: Progressing Goal: Individualized Educational Video(s) Outcome: Progressing   Problem: Activity: Goal: Capacity to carry out activities will improve Outcome: Progressing   Problem: Cardiac: Goal: Ability to achieve and maintain adequate cardiopulmonary perfusion will improve Outcome: Progressing   

## 2021-05-09 NOTE — Progress Notes (Deleted)
Progress Note  Patient Name: CURRIE DENNIN Date of Encounter: 05/09/2021  [facility] Cardiologist: Arvilla Meres, MD   Subjective   Denies chest pain, palpitations, SOB, nausea. Reports occasional light headedness when he stands up to fast. Reports feeling like the swelling in his legs is improving and he overall feels well today.   Inpatient Medications    Scheduled Meds:  aspirin EC  81 mg Oral Daily   atorvastatin  40 mg Oral QPM   carvedilol  3.125 mg Oral BID WC   dapagliflozin propanediol  10 mg Oral Q breakfast   digoxin  125 mcg Oral Daily   enoxaparin (LOVENOX) injection  40 mg Subcutaneous Q24H   furosemide  40 mg Intravenous BID   influenza vaccine adjuvanted  0.5 mL Intramuscular Tomorrow-1000   iron polysaccharides  150 mg Oral Daily   magnesium oxide  400 mg Oral BID   pneumococcal 23 valent vaccine  0.5 mL Intramuscular Tomorrow-1000   potassium chloride  40 mEq Oral Once   potassium chloride SA  40 mEq Oral Daily   sacubitril-valsartan  1 tablet Oral BID   sertraline  25 mg Oral Daily   sodium chloride flush  3 mL Intravenous Q12H   spironolactone  25 mg Oral Daily   Continuous Infusions:  sodium chloride     magnesium sulfate bolus IVPB     PRN Meds: sodium chloride, acetaminophen, ondansetron (ZOFRAN) IV, sodium chloride flush   Vital Signs    Vitals:   05/08/21 2103 05/09/21 0449 05/09/21 0500 05/09/21 0600  BP: (!) 100/52 (!) 117/56    Pulse: 63 64    Resp: 20 19    Temp: 98 F (36.7 C) 97.7 F (36.5 C)    TempSrc: Oral Oral    SpO2: 93% 91%  93%  Weight:   61.9 kg   Height:        Intake/Output Summary (Last 24 hours) at 05/09/2021 0843 Last data filed at 05/09/2021 0044 Gross per 24 hour  Intake 1180 ml  Output 4050 ml  Net -2870 ml   Last 3 Weights 05/09/2021 05/08/2021 05/07/2021  Weight (lbs) 136 lb 7.4 oz 146 lb 9.7 oz 149 lb 4 oz  Weight (kg) 61.9 kg 66.5 kg 67.7 kg      Telemetry    Sinus rhythm, wide QRS  complex consistent with LBBB - Personally Reviewed  ECG    No new tracing - Personally Reviewed  Physical Exam   GEN: No acute distress, sitting comfortably in the chair.   Neck: No JVD Cardiac: RRR, no murmurs, rubs, or gallops.  Respiratory: Occasional inspiratory wheezes. No rales or rhonchi MS: 3+ pitting edema BLE; No deformity. Radial pulses 2+ and regular bilaterally  Neuro:  Nonfocal  Psych: Normal affect   Labs    High Sensitivity Troponin:   Recent Labs  Lab 05/06/21 2159  TROPONINIHS 90*     Chemistry Recent Labs  Lab 05/06/21 2159 05/08/21 0423 05/09/21 0457  NA 137 139 138  K 3.4* 3.4* 3.4*  CL 106 107 102  CO2 18* 24 27  GLUCOSE 93 99 86  BUN 14 14 10   CREATININE 0.93 0.95 1.03  CALCIUM 8.6* 8.3* 8.3*  MG  --  1.5* 1.6*  PROT 8.3* 6.9 6.4*  ALBUMIN 3.5 2.8* 2.7*  AST 101* 136* 98*  ALT 33 55* 45*  ALKPHOS 117 94 90  BILITOT 1.5* 2.4* 1.8*  GFRNONAA >60 >60 >60  ANIONGAP 13 8 9  Lipids No results for input(s): CHOL, TRIG, HDL, LABVLDL, LDLCALC, CHOLHDL in the last 168 hours.  Hematology Recent Labs  Lab 05/06/21 2159 05/08/21 0423 05/09/21 0457  WBC 4.2 4.7 3.6*  RBC 4.35 3.91* 4.52  HGB 12.9* 11.6* 13.7  HCT 39.6 34.2* 40.9  MCV 91.0 87.5 90.5  MCH 29.7 29.7 30.3  MCHC 32.6 33.9 33.5  RDW 18.6* 18.4* 18.8*  PLT 207 202 224   Thyroid No results for input(s): TSH, FREET4 in the last 168 hours.  BNP Recent Labs  Lab 05/06/21 2159  BNP >4,500.0*    DDimer No results for input(s): DDIMER in the last 168 hours.   Radiology    ECHOCARDIOGRAM COMPLETE  Result Date: 05/08/2021    ECHOCARDIOGRAM REPORT   Patient Name:   SELMA RODELO Date of Exam: 05/08/2021 Medical Rec #:  [id number]     Height:       64.0 in Accession #:    [id number]    Weight:       146.6 lb Date of Birth:  [birth year]     BSA:          1.714 m Patient Age:    65 years      BP:           118/51 mmHg Patient Gender: M             HR:           69 bpm. Exam  Location:  Inpatient Procedure: 2D Echo, 3D Echo, Cardiac Doppler, Color Doppler and Intracardiac            Opacification Agent Indications:    CHF-Acute Systolic I50.21  History:        Patient has prior history of Echocardiogram examinations, most                 recent 12/24/2020. CHF, CAD; Risk Factors:Hypertension.  Sonographer:    Eulah Pont RDCS Referring Phys: [id number] [facility] POKHREL IMPRESSIONS  1. Left ventricular ejection fraction, by estimation, is 20 to 25%. The left ventricle has severely decreased function. The left ventricle demonstrates global hypokinesis. The left ventricular internal cavity size was severely dilated. Left ventricular diastolic parameters are consistent with Grade II diastolic dysfunction (pseudonormalization). Elevated left atrial pressure.  2. Right ventricular systolic function is moderately reduced. The right ventricular size is mildly enlarged. There is mildly elevated pulmonary artery systolic pressure. The estimated right ventricular systolic pressure is 43.2 mmHg.  3. Left atrial size was mildly dilated.  4. Right atrial size was mildly dilated.  5. The mitral valve is normal in structure. Moderate mitral valve regurgitation.  6. The aortic valve was not well visualized. Aortic valve regurgitation is mild. No aortic stenosis is present.  7. The inferior vena cava is normal in size with greater than 50% respiratory variability, suggesting right atrial pressure of 3 mmHg. FINDINGS  Left Ventricle: Left ventricular ejection fraction, by estimation, is 20 to 25%. The left ventricle has severely decreased function. The left ventricle demonstrates global hypokinesis. Definity contrast agent was given IV to delineate the left ventricular endocardial borders. The left ventricular internal cavity size was severely dilated. There is no left ventricular hypertrophy. Left ventricular diastolic parameters are consistent with Grade II diastolic dysfunction (pseudonormalization).  Elevated left atrial pressure. Right Ventricle: The right ventricular size is mildly enlarged. Right vetricular wall thickness was not well visualized. Right ventricular systolic function is moderately reduced. There is mildly elevated pulmonary artery systolic pressure. The  tricuspid  regurgitant velocity is 3.17 m/s, and with an assumed right atrial pressure of 3 mmHg, the estimated right ventricular systolic pressure is 43.2 mmHg. Left Atrium: Left atrial size was mildly dilated. Right Atrium: Right atrial size was mildly dilated. Pericardium: There is no evidence of pericardial effusion. Mitral Valve: The mitral valve is normal in structure. Moderate mitral valve regurgitation. Tricuspid Valve: The tricuspid valve is normal in structure. Tricuspid valve regurgitation is trivial. Aortic Valve: The aortic valve was not well visualized. Aortic valve regurgitation is mild. Aortic regurgitation PHT measures 475 msec. No aortic stenosis is present. Pulmonic Valve: The pulmonic valve was not well visualized. Pulmonic valve regurgitation is not visualized. Aorta: The aortic root and ascending aorta are structurally normal, with no evidence of dilitation. Venous: The inferior vena cava is normal in size with greater than 50% respiratory variability, suggesting right atrial pressure of 3 mmHg. IAS/Shunts: The interatrial septum was not well visualized.  LEFT VENTRICLE PLAX 2D LVIDd:         7.10 cm      Diastology LVIDs:         5.80 cm      LV e' medial:    3.77 cm/s LV PW:         1.00 cm      LV E/e' medial:  27.6 LV IVS:        0.80 cm      LV e' lateral:   7.37 cm/s LVOT diam:     1.80 cm      LV E/e' lateral: 14.1 LV SV:         71 LV SV Index:   42 LVOT Area:     2.54 cm                              3D Volume EF: LV Volumes (MOD)            3D EF:        24 % LV vol d, MOD A2C: 367.0 ml LV EDV:       375 ml LV vol d, MOD A4C: 292.0 ml LV ESV:       284 ml LV vol s, MOD A2C: 234.0 ml LV SV:        91 ml LV vol s,  MOD A4C: 230.0 ml LV SV MOD A2C:     133.0 ml LV SV MOD A4C:     292.0 ml LV SV MOD BP:      104.6 ml RIGHT VENTRICLE RV S prime:     5.80 cm/s TAPSE (M-mode): 1.4 cm LEFT ATRIUM             Index        RIGHT ATRIUM           Index LA diam:        5.10 cm 2.97 cm/m   RA Area:     21.20 cm LA Vol (A2C):   69.6 ml 40.60 ml/m  RA Volume:   66.70 ml  38.90 ml/m LA Vol (A4C):   58.6 ml 34.18 ml/m LA Biplane Vol: 63.1 ml 36.80 ml/m  AORTIC VALVE LVOT Vmax:   161.00 cm/s LVOT Vmean:  102.000 cm/s LVOT VTI:    0.280 m AI PHT:      475 msec  AORTA Ao Root diam: 3.20 cm Ao Asc diam:  2.80 cm MITRAL VALVE  TRICUSPID VALVE MV Area (PHT): 3.91 cm       TV Peak grad:   20.8 mmHg MV Decel Time: 194 msec       TV Vmax:        2.28 m/s MR Peak grad:    82.8 mmHg    TR Peak grad:   40.2 mmHg MR Mean grad:    51.0 mmHg    TR Vmax:        317.00 cm/s MR Vmax:         455.00 cm/s MR Vmean:        333.0 cm/s   SHUNTS MR PISA:         1.01 cm     Systemic VTI:  0.28 m MR PISA Eff ROA: 9 mm        Systemic Diam: 1.80 cm MR PISA Radius:  0.40 cm MV E velocity: 104.00 cm/s MV A velocity: 96.00 cm/s MV E/A ratio:  1.08 Epifanio Lesches MD Electronically signed by Epifanio Lesches MD Signature Date/Time: 05/08/2021/1:38:09 PM    Final     Cardiac Studies  Echo 05/08/2021  1. Left ventricular ejection fraction, by estimation, is 20 to 25%. The  left ventricle has severely decreased function. The left ventricle  demonstrates global hypokinesis. The left ventricular internal cavity size  was severely dilated. Left ventricular  diastolic parameters are consistent with Grade II diastolic dysfunction  (pseudonormalization). Elevated left atrial pressure.   2. Right ventricular systolic function is moderately reduced. The right  ventricular size is mildly enlarged. There is mildly elevated pulmonary  artery systolic pressure. The estimated right ventricular systolic  pressure is 43.2 mmHg.   3. Left  atrial size was mildly dilated.   4. Right atrial size was mildly dilated.   5. The mitral valve is normal in structure. Moderate mitral valve  regurgitation.   6. The aortic valve was not well visualized. Aortic valve regurgitation  is mild. No aortic stenosis is present.   7. The inferior vena cava is normal in size with greater than 50%  respiratory variability, suggesting right atrial pressure of 3 mmHg.   Echo 12/23/2020  1. Compared to prior study from 07/15/20, RVEF is depressed; LVEF is  probably worse and Valvular dysfunction is worse.   2. Severe global hypokineiss; septal akinesis.. Left ventricular ejection  fraction, by estimation, is 10-15%. The left ventricle has severely  decreased function. The left ventricular internal cavity size was severely  dilated. Left ventricular diastolic  parameters are consistent with Grade II diastolic dysfunction  (pseudonormalization). Elevated left atrial pressure.   3. Right ventricular systolic function moderate to severely reduced. The  right ventricular size is normal.   4. Left atrial size was moderately dilated.   5. Right atrial size was moderately dilated.   6. The mitral valve is normal in structure. Moderate to severe mitral  valve regurgitation.   7. Tricuspid valve regurgitation is moderate.   8. The aortic valve is tricuspid. Aortic valve regurgitation is moderate  to severe. Mild aortic valve sclerosis is present, with no evidence of  aortic valve stenosis.   9. The inferior vena cava is dilated in size with <50% respiratory  variability, suggesting right atrial pressure of 15 mmHg.     Patient Profile     65 y.o. male with a hx of systolic and diastolic CHF, HTN, HLD, iron deficiency anemia, hx of CAD PCI to LAD in 2015 and 2016, s/p CABG 2017 who is being seen  05/08/2021 for the evaluation of acute on chronic systolic CHF at the request of Dr. Tyson Babinski.  Assessment & Plan   Acute on Chronic CHF with reduced EF:  Secondary to medication noncompliance. Patient presented complaining of orthopnea, dyspnea on exertion with gross peripheral edema. BNP >4500 in the ED. Most recent echo was 12/24/2020 and showed LVEF 10-15% (down from 25-30% on 07/15/2020), severe global hypokinesis,septal akinesis. Patient is followed by cardiology advanced heart failure. Fluids net -4.01 L since admission.  - Continue strict I/Os and daily weights  - Echocardiogram on 05/08/21 showed LVEF 20-25%, global hypokinesis. RV systolic function moderately reduced. RA and LA mildly dilated. Moderate MVR.  - Continue Farxiga 10 mg, entresto 49-51 mg, digoxin 125 mcg, spironolactone 25 mg - Continue lasix 40 mg IV BID (creatinine 10.3 on 12/20, up from 0.95)  - Continue current dose of Coreg, consider increasing as BP allows.    Medication noncompliance: Patient reports that he does not take his medications because he cannot read the labels. Takes his medications only when someone helps him. Serum digoxin <0.2. Patient reports that there is usually someone who comes to his home to help him with his medications, but she was gone for a few weeks and he did not take his medications during that time.  - Consult social work.  Make sure he is still established with paramedicine    Hypokalemia: K 3.4 on admission>> 3.4 on 12/19>> 3.4 on 12.20  - Closely monitor as patient is on entresto and IV lasix  - Replete until K>4, consider increasing potassium supplementation as K has not improved on current regiment    CAD: s/p CABG 2017, LHC 06/2018  with occluded LAD normal LCX and RCA. LIMA to LAD and SVG to D2 patent (SVG to D1 occluded). HSTN 90 - Patient does not have chest pain at this time.  - HSTN likely elevated in the setting of acute on chronic HF with fluid overload  - Continue aspirin 81mg , coreg 3.125mg , and Lipitor 40 mg    HTN:  - Patient will be on diuretics, entresto, and Coreg    LBBB: old  - Will likely need EP follow up at discharge  for consideration of CRT-D  For questions or updates, please contact [facility] Please consult [email or web address] for contact info under        Signed, Jonita Albee, PA-C  05/09/2021, 8:43 AM

## 2021-05-09 NOTE — TOC Initial Note (Signed)
Transition of Care [facility]) - Initial/Assessment Note    Patient Details  Name: Jeremy Powers MRN: [id number] Date of Birth: Oct 05, 1955  Transition of Care [facility]) CM/SW Contact:    Golda Acre, RN Phone Number: 05/09/2021, 7:57 AM  Clinical Narrative:                  Transition of Care [facility]) Screening Note   Patient Details  Name: Jeremy Powers Date of Birth: 16-Apr-1956   Transition of Care [facility]) CM/SW Contact:    Golda Acre, RN Phone Number: 05/09/2021, 7:57 AM    Transition of Care Department [facility]) has reviewed patient and no TOC needs have been identified at this time. We will continue to monitor patient advancement through interdisciplinary progression rounds. If new patient transition needs arise, please place a TOC consult.    Expected Discharge Plan: Home/Self Care Barriers to Discharge: Continued Medical Work up   Patient Goals and CMS Choice Patient states their goals for this hospitalization and ongoing recovery are:: i would like to go home CMS Medicare.gov Compare Post Acute Care list provided to:: Patient    Expected Discharge Plan and Services Expected Discharge Plan: Home/Self Care   Discharge Planning Services: CM Consult   Living arrangements for the past 2 months: Apartment                                      Prior Living Arrangements/Services Living arrangements for the past 2 months: Apartment Lives with:: Self Patient language and need for interpreter reviewed:: Yes Do you feel safe going back to the place where you live?: Yes            Criminal Activity/Legal Involvement Pertinent to Current Situation/Hospitalization: No - Comment as needed  Activities of Daily Living Home Assistive Devices/Equipment: None ADL Screening (condition at time of admission) Patient's cognitive ability adequate to safely complete daily activities?: Yes Is the patient deaf or have difficulty hearing?: No Does the patient have  difficulty seeing, even when wearing glasses/contacts?: No Does the patient have difficulty concentrating, remembering, or making decisions?: No Patient able to express need for assistance with ADLs?: Yes Does the patient have difficulty dressing or bathing?: Yes Independently performs ADLs?: No Communication: Independent Dressing (OT): Needs assistance Is this a change from baseline?: Pre-admission baseline Grooming: Needs assistance Is this a change from baseline?: Pre-admission baseline Feeding: Independent Bathing: Needs assistance Is this a change from baseline?: Pre-admission baseline Toileting: Needs assistance Is this a change from baseline?: Pre-admission baseline In/Out Bed: Needs assistance Is this a change from baseline?: Pre-admission baseline Walks in Home: Needs assistance Is this a change from baseline?: Pre-admission baseline Does the patient have difficulty walking or climbing stairs?: Yes Weakness of Legs: Both Weakness of Arms/Hands: None  Permission Sought/Granted                  Emotional Assessment Appearance:: Appears stated age Attitude/Demeanor/Rapport: Engaged Affect (typically observed): Calm Orientation: : Oriented to Place, Oriented to Self, Oriented to  Time, Oriented to Situation Alcohol / Substance Use: Not Applicable Psych Involvement: No (comment)  Admission diagnosis:  Acute on chronic systolic congestive heart failure (HCC) [I50.23] Acute on chronic systolic CHF (congestive heart failure) (HCC) [I50.23] CHF (congestive heart failure) (HCC) [I50.9] Patient Active Problem List   Diagnosis Date Noted   CHF (congestive heart failure) (HCC) 05/08/2021   Acute on chronic systolic  CHF (congestive heart failure) (HCC) 05/06/2021   Mitral regurgitation 12/26/2020   COVID-19 virus infection 12/24/2020   CHF exacerbation (HCC) 12/23/2020   Acute on chronic systolic HF (heart failure) (HCC) 06/24/2018   Aortic insufficiency 06/22/2018    Acute on chronic congestive heart failure (HCC) 06/22/2018   Elevated troponin 06/22/2018   CAD (coronary artery disease) 06/22/2018   Left bundle branch block 06/22/2018   History of iron deficiency anemia 06/22/2018   Acute on chronic heart failure (HCC) 06/22/2018   Chronic bilateral low back pain without sciatica 12/12/2015   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   Iron deficiency anemia 03/28/2015   Hyperlipidemia 10/12/2014   PCP:  [name], PA-C Pharmacy:   [facility] - [place], [place] - [address] [place] [address] [place] [address] Phone: [phone] Fax: [phone]     Social Determinants of Health (SDOH) Interventions    Readmission Risk Interventions No flowsheet data found.

## 2021-05-09 NOTE — TOC Progression Note (Signed)
Transition of Care [facility]) - Progression Note    Patient Details  Name: Jeremy Powers MRN: [id number] Date of Birth: [birth year]  Transition of Care [facility]) CM/SW Contact  Golda Acre, RN Phone Number: 05/09/2021, 11:15 AM  Clinical Narrative:    Request for home heart failure screening and possible RN sent to [facility] at 1115   Expected Discharge Plan: Home/Self Care Barriers to Discharge: Continued Medical Work up  Expected Discharge Plan and Services Expected Discharge Plan: Home/Self Care   Discharge Planning Services: CM Consult   Living arrangements for the past 2 months: Apartment                                       Social Determinants of Health (SDOH) Interventions    Readmission Risk Interventions No flowsheet data found.

## 2021-05-10 ENCOUNTER — Other Ambulatory Visit: Payer: Self-pay | Admitting: Physician Assistant

## 2021-05-10 DIAGNOSIS — I5022 Chronic systolic (congestive) heart failure: Secondary | ICD-10-CM

## 2021-05-10 LAB — BASIC METABOLIC PANEL
Anion gap: 6 (ref 5–15)
BUN: 9 mg/dL (ref 8–23)
CO2: 30 mmol/L (ref 22–32)
Calcium: 7.8 mg/dL — ABNORMAL LOW (ref 8.9–10.3)
Chloride: 100 mmol/L (ref 98–111)
Creatinine, Ser: 0.95 mg/dL (ref 0.61–1.24)
GFR, Estimated: >60 mL/min (ref >60)
Glucose, Bld: 90 mg/dL (ref 70–99)
Potassium: 3.3 mmol/L — ABNORMAL LOW (ref 3.5–5.1)
Sodium: 136 mmol/L (ref 135–145)

## 2021-05-10 LAB — MAGNESIUM: Magnesium: 1.9 mg/dL (ref 1.7–2.4)

## 2021-05-10 LAB — CBC
HCT: 37.3 % — ABNORMAL LOW (ref 39.0–52.0)
Hemoglobin: 12.4 g/dL — ABNORMAL LOW (ref 13.0–17.0)
MCH: 30 pg (ref 26.0–34.0)
MCHC: 33.2 g/dL (ref 30.0–36.0)
MCV: 90.1 fL (ref 80.0–100.0)
Platelets: 221 10*3/uL (ref 150–400)
RBC: 4.14 MIL/uL — ABNORMAL LOW (ref 4.22–5.81)
RDW: 18.3 % — ABNORMAL HIGH (ref 11.5–15.5)
WBC: 4.3 10*3/uL (ref 4.0–10.5)
nRBC: 0 % (ref 0.0–0.2)

## 2021-05-10 LAB — PHOSPHORUS: Phosphorus: 1.8 mg/dL — ABNORMAL LOW (ref 2.5–4.6)

## 2021-05-10 MED ORDER — ASPIRIN 81 MG PO TBEC: 81.0000 mg | DELAYED_RELEASE_TABLET | Freq: Every day | ORAL | 11 refills | Status: DC

## 2021-05-10 MED ORDER — POTASSIUM PHOSPHATES 15 MMOLE/5ML IV SOLN
30.0000 mmol | Freq: Once | INTRAVENOUS | Status: AC
  Administered 2021-05-10: 09:00:00 30 mmol via INTRAVENOUS
  Filled 2021-05-10: qty 10

## 2021-05-10 MED ORDER — FUROSEMIDE 40 MG PO TABS: 40.0000 mg | ORAL_TABLET | Freq: Every day | ORAL | Status: DC

## 2021-05-10 MED ORDER — SODIUM CHLORIDE 0.9 % IV SOLN: INTRAVENOUS | Status: DC | PRN

## 2021-05-10 NOTE — Progress Notes (Signed)
Progress Note  Patient Name: Jeremy Powers Date of Encounter: 05/10/2021  [facility] Cardiologist: Arvilla Meres, MD   Subjective   Pt denies CP or dyspnea  Inpatient Medications    Scheduled Meds:  aspirin EC  81 mg Oral Daily   atorvastatin  40 mg Oral QPM   carvedilol  3.125 mg Oral BID WC   dapagliflozin propanediol  10 mg Oral Q breakfast   digoxin  125 mcg Oral Daily   enoxaparin (LOVENOX) injection  40 mg Subcutaneous Q24H   furosemide  40 mg Intravenous BID   influenza vaccine adjuvanted  0.5 mL Intramuscular Tomorrow-1000   iron polysaccharides  150 mg Oral Daily   magnesium oxide  400 mg Oral BID   pneumococcal 23 valent vaccine  0.5 mL Intramuscular Tomorrow-1000   potassium chloride SA  40 mEq Oral Daily   sacubitril-valsartan  1 tablet Oral BID   sertraline  25 mg Oral Daily   sodium chloride flush  3 mL Intravenous Q12H   spironolactone  25 mg Oral Daily   Continuous Infusions:  sodium chloride     sodium chloride     potassium PHOSPHATE IVPB (in mmol) 30 mmol (05/10/21 0912)   PRN Meds: sodium chloride, sodium chloride, acetaminophen, ondansetron (ZOFRAN) IV, sodium chloride flush   Vital Signs    Vitals:   05/10/21 0509 05/10/21 0901 05/10/21 0902 05/10/21 0903  BP: 106/62 (!) 118/98  (!) 112/101  Pulse: 61  64   Resp: 14     Temp: 98.4 F (36.9 C)     TempSrc: Oral     SpO2: 93%     Weight:      Height:        Intake/Output Summary (Last 24 hours) at 05/10/2021 0937 Last data filed at 05/09/2021 2137 Gross per 24 hour  Intake 833 ml  Output 2200 ml  Net -1367 ml    Last 3 Weights 05/10/2021 05/09/2021 05/08/2021  Weight (lbs) 135 lb 12.9 oz 136 lb 7.4 oz 146 lb 9.7 oz  Weight (kg) 61.6 kg 61.9 kg 66.5 kg      Telemetry    Sinus - Personally Reviewed   Physical Exam   GEN: WD, NAD Neck: supple Cardiac: RRR Respiratory: CTA GI: Soft, NT/ND MS: trace edema Neuro:  Grossly intact Psych: Normal affect   Labs     High Sensitivity Troponin:   Recent Labs  Lab 05/06/21 2159  TROPONINIHS 90*      Chemistry Recent Labs  Lab 05/06/21 2159 05/08/21 0423 05/09/21 0457 05/10/21 0504  NA 137 139 138 136  K 3.4* 3.4* 3.4* 3.3*  CL 106 107 102 100  CO2 18* 24 27 30   GLUCOSE 93 99 86 90  BUN 14 14 10 9   CREATININE 0.93 0.95 1.03 0.95  CALCIUM 8.6* 8.3* 8.3* 7.8*  MG  --  1.5* 1.6* 1.9  PROT 8.3* 6.9 6.4*  --   ALBUMIN 3.5 2.8* 2.7*  --   AST 101* 136* 98*  --   ALT 33 55* 45*  --   ALKPHOS 117 94 90  --   BILITOT 1.5* 2.4* 1.8*  --   GFRNONAA >60 >60 >60 >60  ANIONGAP 13 8 9 6       Hematology Recent Labs  Lab 05/08/21 0423 05/09/21 0457 05/10/21 0504  WBC 4.7 3.6* 4.3  RBC 3.91* 4.52 4.14*  HGB 11.6* 13.7 12.4*  HCT 34.2* 40.9 37.3*  MCV 87.5 90.5 90.1  MCH 29.7  30.3 30.0  MCHC 33.9 33.5 33.2  RDW 18.4* 18.8* 18.3*  PLT 202 224 221    BNP Recent Labs  Lab 05/06/21 2159  BNP >4,500.0*       Radiology    ECHOCARDIOGRAM COMPLETE  Result Date: 05/08/2021    ECHOCARDIOGRAM REPORT   Patient Name:   Jeremy Powers Date of Exam: 05/08/2021 Medical Rec #:  [id number]     Height:       64.0 in Accession #:    [id number]    Weight:       146.6 lb Date of Birth:  1955-07-26     BSA:          1.714 m Patient Age:    64 years      BP:           118/51 mmHg Patient Gender: M             HR:           69 bpm. Exam Location:  Inpatient Procedure: 2D Echo, 3D Echo, Cardiac Doppler, Color Doppler and Intracardiac            Opacification Agent Indications:    CHF-Acute Systolic I50.21  History:        Patient has prior history of Echocardiogram examinations, most                 recent 12/24/2020. CHF, CAD; Risk Factors:Hypertension.  Sonographer:    Eulah Pont RDCS Referring Phys: [id number] [facility] POKHREL IMPRESSIONS  1. Left ventricular ejection fraction, by estimation, is 20 to 25%. The left ventricle has severely decreased function. The left ventricle demonstrates global hypokinesis. The  left ventricular internal cavity size was severely dilated. Left ventricular diastolic parameters are consistent with Grade II diastolic dysfunction (pseudonormalization). Elevated left atrial pressure.  2. Right ventricular systolic function is moderately reduced. The right ventricular size is mildly enlarged. There is mildly elevated pulmonary artery systolic pressure. The estimated right ventricular systolic pressure is 43.2 mmHg.  3. Left atrial size was mildly dilated.  4. Right atrial size was mildly dilated.  5. The mitral valve is normal in structure. Moderate mitral valve regurgitation.  6. The aortic valve was not well visualized. Aortic valve regurgitation is mild. No aortic stenosis is present.  7. The inferior vena cava is normal in size with greater than 50% respiratory variability, suggesting right atrial pressure of 3 mmHg. FINDINGS  Left Ventricle: Left ventricular ejection fraction, by estimation, is 20 to 25%. The left ventricle has severely decreased function. The left ventricle demonstrates global hypokinesis. Definity contrast agent was given IV to delineate the left ventricular endocardial borders. The left ventricular internal cavity size was severely dilated. There is no left ventricular hypertrophy. Left ventricular diastolic parameters are consistent with Grade II diastolic dysfunction (pseudonormalization). Elevated left atrial pressure. Right Ventricle: The right ventricular size is mildly enlarged. Right vetricular wall thickness was not well visualized. Right ventricular systolic function is moderately reduced. There is mildly elevated pulmonary artery systolic pressure. The tricuspid  regurgitant velocity is 3.17 m/s, and with an assumed right atrial pressure of 3 mmHg, the estimated right ventricular systolic pressure is 43.2 mmHg. Left Atrium: Left atrial size was mildly dilated. Right Atrium: Right atrial size was mildly dilated. Pericardium: There is no evidence of pericardial  effusion. Mitral Valve: The mitral valve is normal in structure. Moderate mitral valve regurgitation. Tricuspid Valve: The tricuspid valve is normal in structure. Tricuspid valve regurgitation is trivial. Aortic  Valve: The aortic valve was not well visualized. Aortic valve regurgitation is mild. Aortic regurgitation PHT measures 475 msec. No aortic stenosis is present. Pulmonic Valve: The pulmonic valve was not well visualized. Pulmonic valve regurgitation is not visualized. Aorta: The aortic root and ascending aorta are structurally normal, with no evidence of dilitation. Venous: The inferior vena cava is normal in size with greater than 50% respiratory variability, suggesting right atrial pressure of 3 mmHg. IAS/Shunts: The interatrial septum was not well visualized.  LEFT VENTRICLE PLAX 2D LVIDd:         7.10 cm      Diastology LVIDs:         5.80 cm      LV e' medial:    3.77 cm/s LV PW:         1.00 cm      LV E/e' medial:  27.6 LV IVS:        0.80 cm      LV e' lateral:   7.37 cm/s LVOT diam:     1.80 cm      LV E/e' lateral: 14.1 LV SV:         71 LV SV Index:   42 LVOT Area:     2.54 cm                              3D Volume EF: LV Volumes (MOD)            3D EF:        24 % LV vol d, MOD A2C: 367.0 ml LV EDV:       375 ml LV vol d, MOD A4C: 292.0 ml LV ESV:       284 ml LV vol s, MOD A2C: 234.0 ml LV SV:        91 ml LV vol s, MOD A4C: 230.0 ml LV SV MOD A2C:     133.0 ml LV SV MOD A4C:     292.0 ml LV SV MOD BP:      104.6 ml RIGHT VENTRICLE RV S prime:     5.80 cm/s TAPSE (M-mode): 1.4 cm LEFT ATRIUM             Index        RIGHT ATRIUM           Index LA diam:        5.10 cm 2.97 cm/m   RA Area:     21.20 cm LA Vol (A2C):   69.6 ml 40.60 ml/m  RA Volume:   66.70 ml  38.90 ml/m LA Vol (A4C):   58.6 ml 34.18 ml/m LA Biplane Vol: 63.1 ml 36.80 ml/m  AORTIC VALVE LVOT Vmax:   161.00 cm/s LVOT Vmean:  102.000 cm/s LVOT VTI:    0.280 m AI PHT:      475 msec  AORTA Ao Root diam: 3.20 cm Ao Asc diam:   2.80 cm MITRAL VALVE                  TRICUSPID VALVE MV Area (PHT): 3.91 cm       TV Peak grad:   20.8 mmHg MV Decel Time: 194 msec       TV Vmax:        2.28 m/s MR Peak grad:    82.8 mmHg    TR Peak grad:   40.2 mmHg MR Mean grad:  51.0 mmHg    TR Vmax:        317.00 cm/s MR Vmax:         455.00 cm/s MR Vmean:        333.0 cm/s   SHUNTS MR PISA:         1.01 cm     Systemic VTI:  0.28 m MR PISA Eff ROA: 9 mm        Systemic Diam: 1.80 cm MR PISA Radius:  0.40 cm MV E velocity: 104.00 cm/s MV A velocity: 96.00 cm/s MV E/A ratio:  1.08 Epifanio Lesches MD Electronically signed by Epifanio Lesches MD Signature Date/Time: 05/08/2021/1:38:09 PM    Final      Patient Profile     65 year old male with past medical history of chronic systolic congestive heart failure, coronary artery disease, noncompliance for evaluation of acute on chronic systolic congestive heart failure. Patient does not read.  He has not taken his medications in the past 2 months as he has had no one to fill his pillbox.   Assessment & Plan    1 acute on chronic systolic congestive heart failure-I/O -5257 since admission. Wt 61.6 kg.  Much improved.  We will change Lasix to 40 mg by mouth daily and continue spironolactone 25 mg daily.  Continue Farxiga.  He will need assistance with his medications at discharge.     2 coronary artery disease-continue aspirin and statin.   3 left bundle branch block-need to arrange evaluation with electrophysiology following discharge for consideration of CRT-D.   4 cardiomyopathy-continue present dose of Entresto.  He is also on low-dose carvedilol and digoxin.  We will advance carvedilol as an outpatient as CHF improves and as blood pressure/pulse allow.  5 hypokalemia-supplement.  Would discharge on K-Dur 20 meq p.o. daily.  Patient can be discharged from a cardiac standpoint on preadmission medications as listed.  We will arrange follow-up in CHF clinic in 2 to 4 weeks.  We will  check potassium and renal function 1 week following discharge.  Cardiology will sign off.  Please call with questions.  For questions or updates, please contact [facility] Please consult [email or web address] for contact info under        Signed, Olga Millers, MD  05/10/2021, 9:37 AM

## 2021-05-10 NOTE — Discharge Instructions (Signed)
Follow-up primary care physician in 1 week. Follow-up with cardiology as scheduled in CHF clinic. Advised to continue prior home medications.

## 2021-05-10 NOTE — Progress Notes (Signed)
Physical Therapy Treatment Patient Details Name: Jeremy Powers MRN: [id number] DOB: 1955/07/04 Today's Date: 05/10/2021   History of Present Illness Patient is 65 y.o. male presented to the ED on 12/17 complaining of bilateral lower extremity edema, SOB for the past 1-2 weeks. Reported associated nausea and vomiting, cough. Denied chest pain. Patient is followed by cardiology and on several outpatient medications for CHF. However, the patient reported that he is unable to read and only takes his medications when there is someone to tell him what medications to take. PMH significant for systolic and diastolic CHF, HTN, HLD, iron deficiency anemia, hx of CAD PCI to LAD in 2015 and 2016, s/p CABG 2017.    PT Comments    Patient making good progress with mobility and completed bed mobility and transfers at mod independent to supervision level. Pt experienced 1 bout of Lt knee buckling in gait and mildly unsteady. Balance improved with use of RW, recommend RW for discharge home and HHPT to progress safety with mobility and activity tolerance. Acute PT will continue to progress during stay.    Recommendations for follow up therapy are one component of a multi-disciplinary discharge planning process, led by the attending physician.  Recommendations may be updated based on patient status, additional functional criteria and insurance authorization.  Follow Up Recommendations  Home health PT     Assistance Recommended at Discharge Intermittent Supervision/Assistance  Equipment Recommendations  Rolling walker (2 wheels)    Recommendations for Other Services       Precautions / Restrictions Precautions Precautions: Fall Restrictions Weight Bearing Restrictions: No     Mobility  Bed Mobility Overal bed mobility: Modified Independent             General bed mobility comments: HOB elevated and pt taking extra time, no assist needed    Transfers Overall transfer level: Needs  assistance Equipment used: None Transfers: Sit to/from Stand Sit to Stand: Supervision           General transfer comment: pt steady with rise from EOB and repeated sit<>stand from recliner, using bil UE for power up.    Ambulation/Gait Ambulation/Gait assistance: Min guard;Min assist Gait Distance (Feet): 300 Feet Assistive device: None;IV Pole;Rolling walker (2 wheels) Gait Pattern/deviations: Decreased stride length;Step-through pattern;Knee flexed in stance - left Gait velocity: decr     General Gait Details: pt ambulated with no device and then transitioned to use of IV pole for stability after 1 episode of Lt knee buckling in stance. pt appears to have slight edema of Lt knee. pt ambulated last ~130' of gait with RW for increased stability. Cues needed for proximity and posture to look ahead and scan environment. Pt more stable with RW.   Stairs             Wheelchair Mobility    Modified Rankin (Stroke Patients Only)       Balance Overall balance assessment: Needs assistance;Mild deficits observed, not formally tested Sitting-balance support: Feet supported Sitting balance-Leahy Scale: Good     Standing balance support: During functional activity;Bilateral upper extremity supported;No upper extremity supported;Single extremity supported Standing balance-Leahy Scale: Good                              Cognition Arousal/Alertness: Awake/alert Behavior During Therapy: WFL for tasks assessed/performed Overall Cognitive Status: Within Functional Limits for tasks assessed  Exercises Other Exercises Other Exercises: 10 reps Sit<>Stand from recliner, pt usign bil UE for power up    General Comments        Pertinent Vitals/Pain Pain Assessment: No/denies pain    Home Living                          Prior Function            PT Goals (current goals can now be found in  the care plan section) Acute Rehab PT Goals Patient Stated Goal: get back home PT Goal Formulation: With patient Time For Goal Achievement: 05/22/21 Potential to Achieve Goals: Good Progress towards PT goals: Progressing toward goals    Frequency    Min 3X/week      PT Plan Current plan remains appropriate    Co-evaluation              AM-PAC PT "6 Clicks" Mobility   Outcome Measure  Help needed turning from your back to your side while in a flat bed without using bedrails?: A Little Help needed moving from lying on your back to sitting on the side of a flat bed without using bedrails?: A Little Help needed moving to and from a bed to a chair (including a wheelchair)?: A Little Help needed standing up from a chair using your arms (e.g., wheelchair or bedside chair)?: A Little Help needed to walk in hospital room?: A Little Help needed climbing 3-5 steps with a railing? : A Lot 6 Click Score: 17    End of Session Equipment Utilized During Treatment: Gait belt Activity Tolerance: Patient tolerated treatment well Patient left: in chair;with call bell/phone within reach;with chair alarm set Nurse Communication: Mobility status PT Visit Diagnosis: Muscle weakness (generalized) (M62.81);Difficulty in walking, not elsewhere classified (R26.2)     Time: 1053-1110 PT Time Calculation (min) (ACUTE ONLY): 17 min  Charges:  $Gait Training: 8-22 mins                     Jeremy Powers, DPT Acute Rehabilitation Services Office [phone] Pager [phone]    Jeremy Powers 05/10/2021, 11:52 AM

## 2021-05-10 NOTE — Discharge Summary (Signed)
Physician Discharge Summary  Jeremy Powers JKD:[id number] DOB: 15-Nov-1955 DOA: 05/06/2021  PCP: [name], PA-C  Admit date: 05/06/2021  Discharge date: 05/10/2021  Admitted From: Home.  Disposition:  Home health services.  Recommendations for Outpatient Follow-up:  Follow up with PCP in 1-2 weeks. Please obtain BMP/CBC in one week. Follow-up with cardiology as scheduled in CHF clinic. Advised to continue prior home medications.  Home Health: Yes HH RN Equipment/Devices:None  Discharge Condition: Stable CODE STATUS: Full code Diet recommendation: Heart Healthy   Brief Summary / Hospital Course: This 65 y.o. male with PMH significant for chronic systolic and diastolic heart failure, hypertension, hyperlipidemia, iron deficiency anemia,  history of coronary artery disease status post PCI and CABG  presented to the hospital with increasing shortness of breath, fatigue.  His latest ejection fraction from 4 months back was estimated at 10 to 15%.  Patient states that he does have plenty of medications at home including refills but he has not been taking them because of difficulty reading the labels. He states that he is not been taking his medication for the last 1 month.   In the ED, patient had blood pressure 147/79 on presentation with heart rate of 101.  Patient was afebrile.  Initial labs showed a hypokalemia with a potassium of 3.4.  Creatinine was 0.9.  Digoxin level was noted to be less than 0.2.  BNP was elevated at 4500.  COVID and influenza was negative.  Patient was then considered for admission to the hospital for decompensated heart failure. Patient was admitted for decompensated heart failure.  Cardiology was consulted medications were adjusted.  2D echocardiogram showed improved LVEF to 20 to 25% , is still severely reduced.  Elevated troponins were could be due to demand ischemia.  No active ischemic event. Patient was continued on all home medications.  Patient is  cleared from cardiology, appears to be euvolemic on discharge.  Patient will follow up with cardiology,  appointment has been made.  Patient wants to be discharged.  Home health services been arranged to help with medication management.  He was managed for below problems.  Discharge Diagnoses:  Principal Problem:   Acute on chronic systolic CHF (congestive heart failure) (HCC) Active Problems:   Hyperlipidemia   Iron deficiency anemia   CHF (congestive heart failure) (HCC)  Acute on chronic systolic heart failure: Patient presented with orthopnea, SOB on exertion with gross peripheral edema. BNP on presentation more than 4500.   Patient has been noncompliant with medication due to inability to read the labels. Patient has advanced heart failure and follows up with cardiology as outpatient, last visit October 2022.  Continue with strict intake output charting and daily weights.  CHF protocol. 2D  Echo: LVEF slightly improved to 20 to 25% , still severely reduced. Continue Clifton Custard, digoxin, Spironolactone and Lasix IV daily.  Digoxin level was subtherapeutic.  Advanced heart failure team has been consulted.   Continue fluid restriction.  Cardiologist recommended continue current management. Patient will need TOC assistance on discharge, PT OT evaluation.    Elevated troponin: Patient denies any chest pain, appears volume overloaded. Likely demand ischemia due to decompensated heart failure. Patient has severe systolic dysfunction.  2D echo shows improving LVEF. Continue aspirin and statin.   History of CAD s/p CABG:  No acute chest pain at this time.  EKG with left bundle branch block.  Unchanged from prior.   Hypokalemia: Replaced.  Continue to monitor   Hypomagnesemia: Replaced, Continue to monitor  Essential hypertension: Continue Entresto and IV Lasix.  Continue Coreg   Disposition likely home with home health.  Patient might need home health RN to supervise his  medications on discharge.  Patient does have plenty of medications at home please do not  prescribe new medication unless its not on the home med list.  Discharge Instructions  Discharge Instructions     Call MD for:  difficulty breathing, headache or visual disturbances   Complete by: As directed    Call MD for:  persistant dizziness or light-headedness   Complete by: As directed    Call MD for:  persistant nausea and vomiting   Complete by: As directed    Diet - low sodium heart healthy   Complete by: As directed    Diet Carb Modified   Complete by: As directed    Discharge instructions   Complete by: As directed    Follow-up primary care physician in 1 week. Follow-up with cardiology as scheduled in CHF clinic. Advised to continue prior home medications.   Face-to-face encounter (required for Medicare/Medicaid patients)   Complete by: As directed    I Rae Roam certify that this patient is under my care and that I, or a nurse practitioner or physician's assistant working with me, had a face-to-face encounter that meets the physician face-to-face encounter requirements with this patient on 05/06/2021. The encounter with the patient was in whole, or in part for the following medical condition(s) which is the primary reason for home health care (List medical condition): Patient has poor literacy level and is unable to read or understand his medications.  He has advanced heart failure requiring multiple medications to be taken regularly at home and depends on others to door to his house and talk to him about which ones to take into and visually give them to him each day.  If they are not available on a given day he does not take any medications.   The encounter with the patient was in whole, or in part, for the following medical condition, which is the primary reason for home health care: heart failure with poor literacy difficulty taking medicines without significant help   I  certify that, based on my findings, the following services are medically necessary home health services: Nursing   Reason for Medically Necessary Home Health Services:  Skilled Nursing- Change/Decline in Patient Status Skilled Nursing- Changes in Medication/Medication Management Therapy- Home Adaptation to Facilitate Safety     My clinical findings support the need for the above services: Cognitive impairments, dementia, or mental confusion  that make it unsafe to leave home   Further, I certify that my clinical findings support that this patient is homebound due to: Mental confusion   Home Health   Complete by: As directed    To provide the following care/treatments:  PT OT RN Home Health Aide Social work     Increase activity slowly   Complete by: As directed       Allergies as of 05/10/2021   No Known Allergies      Medication List     TAKE these medications    aspirin 81 MG EC tablet Take 1 tablet (81 mg total) by mouth daily. Swallow whole. Start taking on: May 11, 2021   atorvastatin 40 MG tablet Commonly known as: LIPITOR TAKE ONE TABLET BY MOUTH EVERYDAY TABLET 6PM FOR CHOLESTEROL   carvedilol 3.125 MG tablet Commonly known as: COREG TAKE ONE TABLET BY MOUTH TWO TIMES  A DAY WITH MEALS   digoxin 0.125 MG tablet Commonly known as: LANOXIN TAKE ONE TABLET BY MOUTH DAILY   Farxiga 10 MG Tabs tablet Generic drug: dapagliflozin propanediol TAKE ONE TABLET BY MOUTH DAILY IN THE MORNING BEFORE BREAKFAST   ferrous sulfate 325 (65 FE) MG tablet TAKE ONE TABLET BY MOUTH TWO TIMES A DAY WITH MEALS   furosemide 40 MG tablet Commonly known as: LASIX Take 1 tablet (40 mg total) by mouth daily.   potassium chloride SA 20 MEQ tablet Commonly known as: KLOR-CON M Take 1 tablet (20 mEq total) by mouth daily.   sacubitril-valsartan 49-51 MG Commonly known as: ENTRESTO Take 1 tablet by mouth 2 (two) times daily.   sertraline 25 MG tablet Commonly known as:  Zoloft Take 1 tablet (25 mg total) by mouth daily.   spironolactone 25 MG tablet Commonly known as: ALDACTONE TAKE ONE TABLET BY MOUTH AT BEDTIME        Follow-up Information     Saguier, Kateri Mc. Schedule an appointment as soon as possible for a visit in 1 week(s).   Specialties: Internal Medicine, Family Medicine Contact information: [address] [place] [address] [phone]         Bensimhon, Bevelyn Buckles, MD Follow up in 2 week(s).   Specialty: Cardiology Why: office will call with appt time Contact information: [address] [place] [address] [phone]         [facility] Follow up in 1 week(s).   Specialty: Cardiology Why: Please present for labs - do not need to be fasting Contact information: [address] [id number] mc [place] [address] [phone]               No Known Allergies  Consultations: Cardiology   Procedures/Studies: DG Chest 2 View  Result Date: 05/06/2021 CLINICAL DATA:  Dyspnea EXAM: CHEST - 2 VIEW COMPARISON:  02/06/2021 FINDINGS: Lungs are clear. No pneumothorax or pleural effusion. Coronary artery bypass grafting has been performed. Mild cardiomegaly is stable. Pulmonary vascularity is normal. No acute bone abnormality. IMPRESSION: No active cardiopulmonary disease.  Stable cardiomegaly Electronically Signed   By: Helyn Numbers M.D.   On: 05/06/2021 22:59   ECHOCARDIOGRAM COMPLETE  Result Date: 05/08/2021    ECHOCARDIOGRAM REPORT   Patient Name:   Jeremy Powers Date of Exam: 05/08/2021 Medical Rec #:  [id number]     Height:       64.0 in Accession #:    [id number]    Weight:       146.6 lb Date of Birth:  28-Oct-1955     BSA:          1.714 m Patient Age:    65 years      BP:           118/51 mmHg Patient Gender: M             HR:           69 bpm. Exam Location:  Inpatient Procedure: 2D Echo, 3D Echo, Cardiac  Doppler, Color Doppler and Intracardiac            Opacification Agent Indications:    CHF-Acute Systolic I50.21  History:        Patient has prior history of Echocardiogram examinations, most                 recent 12/24/2020. CHF, CAD; Risk Factors:Hypertension.  Sonographer:    Eulah Pont RDCS Referring Phys: [id number] [facility] POKHREL IMPRESSIONS  1. Left ventricular ejection fraction, by estimation, is 20 to 25%. The left ventricle has severely decreased function. The left ventricle demonstrates global hypokinesis. The left ventricular internal cavity size was severely dilated. Left ventricular diastolic parameters are consistent with Grade II diastolic dysfunction (pseudonormalization). Elevated left atrial pressure.  2. Right ventricular systolic function is moderately reduced. The right ventricular size is mildly enlarged. There is mildly elevated pulmonary artery systolic pressure. The estimated right ventricular systolic pressure is 43.2 mmHg.  3. Left atrial size was mildly dilated.  4. Right atrial size was mildly dilated.  5. The mitral valve is normal in structure. Moderate mitral valve regurgitation.  6. The aortic valve was not well visualized. Aortic valve regurgitation is mild. No aortic stenosis is present.  7. The inferior vena cava is normal in size with greater than 50% respiratory variability, suggesting right atrial pressure of 3 mmHg. FINDINGS  Left Ventricle: Left ventricular ejection fraction, by estimation, is 20 to 25%. The left ventricle has severely decreased function. The left ventricle demonstrates global hypokinesis. Definity contrast agent was given IV to delineate the left ventricular endocardial borders. The left ventricular internal cavity size was severely dilated. There is no left ventricular hypertrophy. Left ventricular diastolic parameters are consistent with Grade II diastolic dysfunction (pseudonormalization). Elevated left atrial pressure. Right Ventricle: The right  ventricular size is mildly enlarged. Right vetricular wall thickness was not well visualized. Right ventricular systolic function is moderately reduced. There is mildly elevated pulmonary artery systolic pressure. The tricuspid  regurgitant velocity is 3.17 m/s, and with an assumed right atrial pressure of 3 mmHg, the estimated right ventricular systolic pressure is 43.2 mmHg. Left Atrium: Left atrial size was mildly dilated. Right Atrium: Right atrial size was mildly dilated. Pericardium: There is no evidence of pericardial effusion. Mitral Valve: The mitral valve is normal in structure. Moderate mitral valve regurgitation. Tricuspid Valve: The tricuspid valve is normal in structure. Tricuspid valve regurgitation is trivial. Aortic Valve: The aortic valve was not well visualized. Aortic valve regurgitation is mild. Aortic regurgitation PHT measures 475 msec. No aortic stenosis is present. Pulmonic Valve: The pulmonic valve was not well visualized. Pulmonic valve regurgitation is not visualized. Aorta: The aortic root and ascending aorta are structurally normal, with no evidence of dilitation. Venous: The inferior vena cava is normal in size with greater than 50% respiratory variability, suggesting right atrial pressure of 3 mmHg. IAS/Shunts: The interatrial septum was not well visualized.  LEFT VENTRICLE PLAX 2D LVIDd:         7.10 cm      Diastology LVIDs:         5.80 cm      LV e' medial:    3.77 cm/s LV PW:         1.00 cm      LV E/e' medial:  27.6 LV IVS:        0.80 cm      LV e' lateral:   7.37 cm/s LVOT diam:     1.80 cm      LV E/e' lateral: 14.1 LV SV:         71 LV SV Index:   42 LVOT Area:     2.54 cm                              3D Volume EF: LV  Volumes (MOD)            3D EF:        24 % LV vol d, MOD A2C: 367.0 ml LV EDV:       375 ml LV vol d, MOD A4C: 292.0 ml LV ESV:       284 ml LV vol s, MOD A2C: 234.0 ml LV SV:        91 ml LV vol s, MOD A4C: 230.0 ml LV SV MOD A2C:     133.0 ml LV SV MOD  A4C:     292.0 ml LV SV MOD BP:      104.6 ml RIGHT VENTRICLE RV S prime:     5.80 cm/s TAPSE (M-mode): 1.4 cm LEFT ATRIUM             Index        RIGHT ATRIUM           Index LA diam:        5.10 cm 2.97 cm/m   RA Area:     21.20 cm LA Vol (A2C):   69.6 ml 40.60 ml/m  RA Volume:   66.70 ml  38.90 ml/m LA Vol (A4C):   58.6 ml 34.18 ml/m LA Biplane Vol: 63.1 ml 36.80 ml/m  AORTIC VALVE LVOT Vmax:   161.00 cm/s LVOT Vmean:  102.000 cm/s LVOT VTI:    0.280 m AI PHT:      475 msec  AORTA Ao Root diam: 3.20 cm Ao Asc diam:  2.80 cm MITRAL VALVE                  TRICUSPID VALVE MV Area (PHT): 3.91 cm       TV Peak grad:   20.8 mmHg MV Decel Time: 194 msec       TV Vmax:        2.28 m/s MR Peak grad:    82.8 mmHg    TR Peak grad:   40.2 mmHg MR Mean grad:    51.0 mmHg    TR Vmax:        317.00 cm/s MR Vmax:         455.00 cm/s MR Vmean:        333.0 cm/s   SHUNTS MR PISA:         1.01 cm     Systemic VTI:  0.28 m MR PISA Eff ROA: 9 mm        Systemic Diam: 1.80 cm MR PISA Radius:  0.40 cm MV E velocity: 104.00 cm/s MV A velocity: 96.00 cm/s MV E/A ratio:  1.08 Epifanio Lesches MD Electronically signed by Epifanio Lesches MD Signature Date/Time: 05/08/2021/1:38:09 PM    Final       Subjective: Patient was seen and examined at bedside.  Overnight events noted.   Patient reports feeling much improved.  He wants to be discharged.  Patient cleared from cardiology,  outpatient appointment has been made.  Discharge Exam: Vitals:   05/10/21 0903 05/10/21 1242  BP: (!) 112/101 (!) 88/47  Pulse:  (!) 57  Resp:  16  Temp:  98 F (36.7 C)  SpO2:  95%   Vitals:   05/10/21 0901 05/10/21 0902 05/10/21 0903 05/10/21 1242  BP: (!) 118/98  (!) 112/101 (!) 88/47  Pulse:  64  (!) 57  Resp:    16  Temp:    98 F (36.7 C)  TempSrc:      SpO2:  95%  Weight:      Height:        General: Pt is alert, awake, not in acute distress Cardiovascular: RRR, S1/S2 +, no rubs, no gallops Respiratory: CTA  bilaterally, no wheezing, no rhonchi Abdominal: Soft, NT, ND, bowel sounds + Extremities: no edema, no cyanosis    The results of significant diagnostics from this hospitalization (including imaging, microbiology, ancillary and laboratory) are listed below for reference.     Microbiology: Recent Results (from the past 240 hour(s))  Resp Panel by RT-PCR (Flu A&B, Covid) Nasopharyngeal Swab     Status: None   Collection Time: 05/06/21 11:13 PM   Specimen: Nasopharyngeal Swab; Nasopharyngeal(NP) swabs in vial transport medium  Result Value Ref Range Status   SARS Coronavirus 2 by RT PCR NEGATIVE NEGATIVE Final    Comment: (NOTE) SARS-CoV-2 target nucleic acids are NOT DETECTED.  The SARS-CoV-2 RNA is generally detectable in upper respiratory specimens during the acute phase of infection. The lowest concentration of SARS-CoV-2 viral copies this assay can detect is 138 copies/mL. A negative result does not preclude SARS-Cov-2 infection and should not be used as the sole basis for treatment or other patient management decisions. A negative result may occur with  improper specimen collection/handling, submission of specimen other than nasopharyngeal swab, presence of viral mutation(s) within the areas targeted by this assay, and inadequate number of viral copies(<138 copies/mL). A negative result must be combined with clinical observations, patient history, and epidemiological information. The expected result is Negative.  Fact Sheet for Patients:  BloggerCourse.com  Fact Sheet for Healthcare Providers:  SeriousBroker.it  This test is no t yet approved or cleared by the [place] FDA and  has been authorized for detection and/or diagnosis of SARS-CoV-2 by FDA under an Emergency Use Authorization (EUA). This EUA will remain  in effect (meaning this test can be used) for the duration of the COVID-19 declaration under Section  564(b)(1) of the Act, 21 U.S.C.section 360bbb-3(b)(1), unless the authorization is terminated  or revoked sooner.       Influenza A by PCR NEGATIVE NEGATIVE Final   Influenza B by PCR NEGATIVE NEGATIVE Final    Comment: (NOTE) The Xpert Xpress SARS-CoV-2/FLU/RSV plus assay is intended as an aid in the diagnosis of influenza from Nasopharyngeal swab specimens and should not be used as a sole basis for treatment. Nasal washings and aspirates are unacceptable for Xpert Xpress SARS-CoV-2/FLU/RSV testing.  Fact Sheet for Patients: BloggerCourse.com  Fact Sheet for Healthcare Providers: SeriousBroker.it  This test is not yet approved or cleared by the [place] FDA and has been authorized for detection and/or diagnosis of SARS-CoV-2 by FDA under an Emergency Use Authorization (EUA). This EUA will remain in effect (meaning this test can be used) for the duration of the COVID-19 declaration under Section 564(b)(1) of the Act, 21 U.S.C. section 360bbb-3(b)(1), unless the authorization is terminated or revoked.  Performed at [facility], [address] [place], [place] [address]      Labs: BNP (last 3 results) Recent Labs    12/23/20 1753 01/13/21 1013 05/06/21 2159  BNP >4,500.0* 2,527.5* >4,500.0*   Basic Metabolic Panel: Recent Labs  Lab 05/06/21 2159 05/08/21 0423 05/09/21 0457 05/10/21 0504  NA 137 139 138 136  K 3.4* 3.4* 3.4* 3.3*  CL 106 107 102 100  CO2 18* 24 27 30   GLUCOSE 93 99 86 90  BUN 14 14 10 9   CREATININE 0.93 0.95 1.03 0.95  CALCIUM 8.6*  8.3* 8.3* 7.8*  MG  --  1.5* 1.6* 1.9  PHOS  --  2.7 2.2* 1.8*   Liver Function Tests: Recent Labs  Lab 05/06/21 2159 05/08/21 0423 05/09/21 0457  AST 101* 136* 98*  ALT 33 55* 45*  ALKPHOS 117 94 90  BILITOT 1.5* 2.4* 1.8*  PROT 8.3* 6.9 6.4*  ALBUMIN 3.5 2.8* 2.7*   No results for input(s): LIPASE, AMYLASE in the last 168 hours. No  results for input(s): AMMONIA in the last 168 hours. CBC: Recent Labs  Lab 05/06/21 2159 05/08/21 0423 05/09/21 0457 05/10/21 0504  WBC 4.2 4.7 3.6* 4.3  HGB 12.9* 11.6* 13.7 12.4*  HCT 39.6 34.2* 40.9 37.3*  MCV 91.0 87.5 90.5 90.1  PLT 207 202 224 221   Cardiac Enzymes: No results for input(s): CKTOTAL, CKMB, CKMBINDEX, TROPONINI in the last 168 hours. BNP: Invalid input(s): POCBNP CBG: No results for input(s): GLUCAP in the last 168 hours. D-Dimer No results for input(s): DDIMER in the last 72 hours. Hgb A1c No results for input(s): HGBA1C in the last 72 hours. Lipid Profile No results for input(s): CHOL, HDL, LDLCALC, TRIG, CHOLHDL, LDLDIRECT in the last 72 hours. Thyroid function studies No results for input(s): TSH, T4TOTAL, T3FREE, THYROIDAB in the last 72 hours.  Invalid input(s): FREET3 Anemia work up No results for input(s): VITAMINB12, FOLATE, FERRITIN, TIBC, IRON, RETICCTPCT in the last 72 hours. Urinalysis No results found for: COLORURINE, APPEARANCEUR, LABSPEC, PHURINE, GLUCOSEU, HGBUR, BILIRUBINUR, KETONESUR, PROTEINUR, UROBILINOGEN, NITRITE, LEUKOCYTESUR Sepsis Labs Invalid input(s): PROCALCITONIN,  WBC,  LACTICIDVEN Microbiology Recent Results (from the past 240 hour(s))  Resp Panel by RT-PCR (Flu A&B, Covid) Nasopharyngeal Swab     Status: None   Collection Time: 05/06/21 11:13 PM   Specimen: Nasopharyngeal Swab; Nasopharyngeal(NP) swabs in vial transport medium  Result Value Ref Range Status   SARS Coronavirus 2 by RT PCR NEGATIVE NEGATIVE Final    Comment: (NOTE) SARS-CoV-2 target nucleic acids are NOT DETECTED.  The SARS-CoV-2 RNA is generally detectable in upper respiratory specimens during the acute phase of infection. The lowest concentration of SARS-CoV-2 viral copies this assay can detect is 138 copies/mL. A negative result does not preclude SARS-Cov-2 infection and should not be used as the sole basis for treatment or other patient  management decisions. A negative result may occur with  improper specimen collection/handling, submission of specimen other than nasopharyngeal swab, presence of viral mutation(s) within the areas targeted by this assay, and inadequate number of viral copies(<138 copies/mL). A negative result must be combined with clinical observations, patient history, and epidemiological information. The expected result is Negative.  Fact Sheet for Patients:  BloggerCourse.com  Fact Sheet for Healthcare Providers:  SeriousBroker.it  This test is no t yet approved or cleared by the [place] FDA and  has been authorized for detection and/or diagnosis of SARS-CoV-2 by FDA under an Emergency Use Authorization (EUA). This EUA will remain  in effect (meaning this test can be used) for the duration of the COVID-19 declaration under Section 564(b)(1) of the Act, 21 U.S.C.section 360bbb-3(b)(1), unless the authorization is terminated  or revoked sooner.       Influenza A by PCR NEGATIVE NEGATIVE Final   Influenza B by PCR NEGATIVE NEGATIVE Final    Comment: (NOTE) The Xpert Xpress SARS-CoV-2/FLU/RSV plus assay is intended as an aid in the diagnosis of influenza from Nasopharyngeal swab specimens and should not be used as a sole basis for treatment. Nasal washings and aspirates are unacceptable for  Xpert Xpress SARS-CoV-2/FLU/RSV testing.  Fact Sheet for Patients: BloggerCourse.com  Fact Sheet for Healthcare Providers: SeriousBroker.it  This test is not yet approved or cleared by the [place] FDA and has been authorized for detection and/or diagnosis of SARS-CoV-2 by FDA under an Emergency Use Authorization (EUA). This EUA will remain in effect (meaning this test can be used) for the duration of the COVID-19 declaration under Section 564(b)(1) of the Act, 21 U.S.C. section 360bbb-3(b)(1),  unless the authorization is terminated or revoked.  Performed at [facility], [address] [place], [place] [address]      Time coordinating discharge: Over 30 minutes  SIGNED:   Cipriano Bunker, MD  [facility] 05/10/2021, 4:07 PM Pager   If 7PM-7AM, please contact night-coverage

## 2021-05-10 NOTE — TOC Transition Note (Signed)
Transition of Care [facility]) - CM/SW Discharge Note   Patient Details  Name: Jeremy Powers MRN: [id number] Date of Birth: 07/01/1955  Transition of Care [facility]) CM/SW Contact:  Golda Acre, RN Phone Number: 05/10/2021, 11:52 AM   Clinical Narrative:    Home p.t. ordered through [facility]   Final next level of care: Home w Home Health Services Barriers to Discharge: Barriers Resolved   Patient Goals and CMS Choice Patient states their goals for this hospitalization and ongoing recovery are:: i would like to go home CMS Medicare.gov Compare Post Acute Care list provided to:: Patient    Discharge Placement                       Discharge Plan and Services   Discharge Planning Services: CM Consult                      HH Arranged: PT [facility] Agency: [facility] Date [facility] Agency Contacted: 05/10/21 Time HH Agency Contacted: 1152 Representative spoke with at [facility] Agency: stacie  Social Determinants of Health (SDOH) Interventions     Readmission Risk Interventions No flowsheet data found.

## 2021-05-11 ENCOUNTER — Telehealth: Payer: Self-pay

## 2021-05-11 NOTE — Telephone Encounter (Signed)
Transition Care Management Follow-up Telephone Call Date of discharge and from where: 05/10/2021-[place] How have you been since you were released from the hospital? Doing fine per significant other. Any questions or concerns? No  Items Reviewed: Did the pt receive and understand the discharge instructions provided? Yes  Medications obtained and verified? Yes  Other? Yes  Any new allergies since your discharge? No  Dietary orders reviewed? Yes Do you have support at home? Yes   Home Care and Equipment/Supplies: Were home health services ordered? yes If so, what is the name of the agency? unknown  Has the agency set up a time to come to the patient's home? no Were any new equipment or medical supplies ordered?  No What is the name of the medical supply agency? N/a Were you able to get the supplies/equipment? not applicable Do you have any questions related to the use of the equipment or supplies? N/a  Functional Questionnaire: (I = Independent and D = Dependent) ADLs: I  Bathing/Dressing- I  Meal Prep- I  Eating- I  Maintaining continence- I  Transferring/Ambulation- I WITH WALKER  Managing Meds- I  Follow up appointments reviewed:  PCP Hospital f/u appt confirmed? Yes  Scheduled to see [name] on 05/19/2021 @ 10:00. Specialist Hospital f/u appt confirmed? Yes  Scheduled to see Dr. Pamella Pert on 05/12/2021 @ 11:00. Are transportation arrangements needed? No  If their condition worsens, is the pt aware to call PCP or go to the Emergency Dept.? Yes Was the patient provided with contact information for the PCP's office or ED? Yes Was to pt encouraged to call back with questions or concerns? Yes

## 2021-05-12 ENCOUNTER — Other Ambulatory Visit: Payer: Self-pay

## 2021-05-12 ENCOUNTER — Ambulatory Visit (HOSPITAL_COMMUNITY): Admission: RE | Admit: 2021-05-12 | Payer: Medicare Other | Source: Ambulatory Visit

## 2021-05-12 ENCOUNTER — Telehealth (HOSPITAL_COMMUNITY): Payer: Self-pay | Admitting: Licensed Clinical Social Worker

## 2021-05-12 ENCOUNTER — Ambulatory Visit (HOSPITAL_COMMUNITY)
Admission: RE | Admit: 2021-05-12 | Discharge: 2021-05-12 | Disposition: A | Discharge status: Home / Self Care | Payer: Medicare Other | Source: Ambulatory Visit | Attending: Internal Medicine | Admitting: Internal Medicine

## 2021-05-12 VITALS — BP 120/76 | HR 77 | Wt 141.0 lb

## 2021-05-12 DIAGNOSIS — Z79899 Other long term (current) drug therapy: Secondary | ICD-10-CM | POA: Diagnosis not present

## 2021-05-12 DIAGNOSIS — I447 Left bundle-branch block, unspecified: Secondary | ICD-10-CM | POA: Insufficient documentation

## 2021-05-12 DIAGNOSIS — Z955 Presence of coronary angioplasty implant and graft: Secondary | ICD-10-CM | POA: Diagnosis not present

## 2021-05-12 DIAGNOSIS — F1290 Cannabis use, unspecified, uncomplicated: Secondary | ICD-10-CM | POA: Diagnosis not present

## 2021-05-12 DIAGNOSIS — Z7982 Long term (current) use of aspirin: Secondary | ICD-10-CM | POA: Diagnosis not present

## 2021-05-12 DIAGNOSIS — I2510 Atherosclerotic heart disease of native coronary artery without angina pectoris: Secondary | ICD-10-CM | POA: Insufficient documentation

## 2021-05-12 DIAGNOSIS — I5082 Biventricular heart failure: Secondary | ICD-10-CM | POA: Diagnosis not present

## 2021-05-12 DIAGNOSIS — D509 Iron deficiency anemia, unspecified: Secondary | ICD-10-CM | POA: Diagnosis not present

## 2021-05-12 DIAGNOSIS — Z550 Illiteracy and low-level literacy: Secondary | ICD-10-CM | POA: Insufficient documentation

## 2021-05-12 DIAGNOSIS — I5022 Chronic systolic (congestive) heart failure: Secondary | ICD-10-CM

## 2021-05-12 DIAGNOSIS — F17210 Nicotine dependence, cigarettes, uncomplicated: Secondary | ICD-10-CM | POA: Diagnosis not present

## 2021-05-12 DIAGNOSIS — Z951 Presence of aortocoronary bypass graft: Secondary | ICD-10-CM | POA: Diagnosis not present

## 2021-05-12 DIAGNOSIS — I080 Rheumatic disorders of both mitral and aortic valves: Secondary | ICD-10-CM | POA: Diagnosis not present

## 2021-05-12 DIAGNOSIS — I509 Heart failure, unspecified: Secondary | ICD-10-CM

## 2021-05-12 DIAGNOSIS — Z87891 Personal history of nicotine dependence: Secondary | ICD-10-CM

## 2021-05-12 DIAGNOSIS — Z7984 Long term (current) use of oral hypoglycemic drugs: Secondary | ICD-10-CM | POA: Diagnosis not present

## 2021-05-12 DIAGNOSIS — I110 Hypertensive heart disease with heart failure: Secondary | ICD-10-CM | POA: Diagnosis present

## 2021-05-12 DIAGNOSIS — I340 Nonrheumatic mitral (valve) insufficiency: Secondary | ICD-10-CM

## 2021-05-12 LAB — BASIC METABOLIC PANEL
Anion gap: 7 (ref 5–15)
BUN: <5 mg/dL — ABNORMAL LOW (ref 8–23)
CO2: 26 mmol/L (ref 22–32)
Calcium: 8.4 mg/dL — ABNORMAL LOW (ref 8.9–10.3)
Chloride: 100 mmol/L (ref 98–111)
Creatinine, Ser: 0.79 mg/dL (ref 0.61–1.24)
GFR, Estimated: >60 mL/min (ref >60)
Glucose, Bld: 91 mg/dL (ref 70–99)
Potassium: 4.1 mmol/L (ref 3.5–5.1)
Sodium: 133 mmol/L — ABNORMAL LOW (ref 135–145)

## 2021-05-12 LAB — BRAIN NATRIURETIC PEPTIDE: B Natriuretic Peptide: >4500 pg/mL — ABNORMAL HIGH (ref 0.0–100.0)

## 2021-05-12 MED ORDER — SACUBITRIL-VALSARTAN 49-51 MG PO TABS: 1.0000 | ORAL_TABLET | Freq: Two times a day (BID) | ORAL | 11 refills | Status: DC

## 2021-05-12 NOTE — Patient Instructions (Signed)
RESTART Entresto 49/51 mg Twice daily  RESTART Lasix 40 mg daily  RESTART Digoxin 0.125 mg daily   Labs done today, your results will be available in MyChart, we will contact you for abnormal readings.   Your physician recommends that you schedule a follow-up appointment in: 2 weeks  Please wear compression hose as directed  If you have any questions or concerns before your next appointment please send [place] a message through [place] or call our office at [phone].    TO LEAVE A MESSAGE FOR THE NURSE SELECT OPTION 2, PLEASE LEAVE A MESSAGE INCLUDING: YOUR NAME DATE OF BIRTH CALL BACK NUMBER REASON FOR CALL**this is important as we prioritize the call backs  YOU WILL RECEIVE A CALL BACK THE SAME DAY AS LONG AS YOU CALL BEFORE 4:00 PM  At the [facility], you and your health needs are our priority. As part of our continuing mission to provide you with exceptional heart care, we have created designated Provider Care Teams. These Care Teams include your primary Cardiologist (physician) and Advanced Practice Providers (APPs- Physician Assistants and Nurse Practitioners) who all work together to provide you with the care you need, when you need it.   You may see any of the following providers on your designated Care Team at your next follow up: Dr Arvilla Meres Dr Carron Curie, NP Robbie Lis, [place] [facility] [place], [place] Karle Plumber, PharmD   Please be sure to bring in all your medications bottles to every appointment.

## 2021-05-12 NOTE — Progress Notes (Signed)
[facility] Note  Date:  05/12/2021   PCP:  [name], PA-C  Cardiologist:  Arvilla Meres, MD Primary HF: Dr. [name]  Chief Complaint: HF Follow up   History of Present Illness: Jeremy Powers is a 65 y.o.male CAD s/p PCI to LAD in 2015 and 2016, s/p CABG 2017, systolic HF EF 20-25%, microcytic/iron deficiency anemia followed by hematology and GI, LBBB, and tobacco use. Illiterate and cannot read.   In 2/20 he presented to [facility] ED with SOB, cough, and orthopnea in setting of med non-compliance. Was unable to afford copays with Medicaid. He was tachycardic and tachypneic on arrival, and was transferred to [facility] 06/22/18 for further evaluation. Echo showed newly decreased EF of 25-30% from 50-55% in 12/2017.  Underwent R/LHC showing severe 1 v disease with occluded LAD within previous stent. RCA and LCX normal. LIMA to LAD and SVG to D2 patent. Cath also showed low filling pressures with moderately low cardiac output. Meds adjusted as tolerated.   OV 04/05/20 returns for HF follow up after not being seen in > 1 year. Lives with his girlfriend. Followed by HF Paramedicine. Lasix was stopped & [name] was started.  Echo 2/22 LVEF 20-25%, RV ok.    3/22 he was discharged from community paramedicine program and bubble pill packs arranged.   Seen in the ED 10/25/20 for CHF exacerbation in the setting of medication non-compliance. He was diuresed with IV lasix and discharged home.  Sent to the ED 12/23/20 by his PCP and found to be A/C CHF and COVID +. Repeat echo EF 10-15%. Diuresed 13 lbs, beta blocker stopped with acute decompensation, digoxin added, and Entresto held with low BP with plans to restart as able.  Admitted 12/22 with A/C CHF after stopping his medications a month prior. Echo showed EF 20-25%. He was diuresed with IV lasix and GDMT restarted.   Today he returns for post hospital HF follow up. Breathing is about the same, some SOB with stairs, but does OK  if he takes his time. Gets around OK at home. He says his swelling is better since hospitalization. He does not know what medications he is taking and knows that he has not taken all of his prescribed meds since discharge. Denies CP, dizziness, edema, or PND/Orthopnea. Appetite ok. No fever or chills. He does not weigh at home.  He lives with his girlfriend and daughter. Has been followed by paramedicine.  Cardiac Studies: - Echo 12/22: EF 20-25%, global HK, grade II DD, RV moderately reduced, moderate MR - Echo 8/22: EF 10-15%, Repeat echo showed EF 10-15%, severe global hypokinesis with septal akinesis, LV severely dilated, grade II DD, RV fxn moderate to severely reduced, moderate to severe MR, moderate to severe AI.  - Echo 06/2920: EF 20-25%, RV ok  - Echo 01/2019: EF 20-25%  - Echo 06/2018: EF 20-25%  - cMRI 06/27/18: EF 24%. Normal RV   - RHC/LHC 06/24/2018  RA = 1 RV = 19/3 PA = 24/5 (14) PCW = 7 Fick cardiac output/index = 4.0/2.5 PVR = 1.9 WU Ao sat = 95% PA sat = 68%, 71%   Assessment: 1. Severe 1-vessel CAD with occluded LAD within previous stent 2. Normal RCA and LCX 3. LIMA to LAD widely patent 4. SVG - D2 widely patent 5. SVG - D1 likely occluded (grafts not marked)  ROS: All systems negative except as listed in HPI, PMH and Problem List.  Past Medical History:  Diagnosis  Date   CHF (congestive heart failure) (HCC)    Cirrhosis (HCC)    Coronary artery disease    Hypertension    Pancreatitis    Past Surgical History:  Procedure Laterality Date   CARDIAC SURGERY     RIGHT/LEFT HEART CATH AND CORONARY/GRAFT ANGIOGRAPHY N/A 06/24/2018   Procedure: RIGHT/LEFT HEART CATH AND CORONARY/GRAFT ANGIOGRAPHY;  Surgeon: Dolores Patty, MD;  Location: [facility] INVASIVE CV LAB;  Service: Cardiovascular;  Laterality: N/A;   Current Outpatient Medications  Medication Sig Dispense Refill   aspirin EC 81 MG EC tablet Take 1 tablet (81 mg total) by mouth daily. Swallow whole. 30 tablet  11   atorvastatin (LIPITOR) 40 MG tablet TAKE ONE TABLET BY MOUTH EVERYDAY TABLET 6PM FOR CHOLESTEROL 30 tablet 4   carvedilol (COREG) 3.125 MG tablet TAKE ONE TABLET BY MOUTH TWO TIMES A DAY WITH MEALS 180 tablet 1   FARXIGA 10 MG TABS tablet TAKE ONE TABLET BY MOUTH DAILY IN THE MORNING BEFORE BREAKFAST 30 tablet 4   ferrous sulfate 325 (65 FE) MG tablet TAKE ONE TABLET BY MOUTH TWO TIMES A DAY WITH MEALS 180 tablet 7   sertraline (ZOLOFT) 25 MG tablet Take 1 tablet (25 mg total) by mouth daily. 30 tablet 5   spironolactone (ALDACTONE) 25 MG tablet TAKE ONE TABLET BY MOUTH AT BEDTIME 30 tablet 10   digoxin (LANOXIN) 0.125 MG tablet TAKE ONE TABLET BY MOUTH DAILY (Patient not taking: Reported on 05/12/2021) 30 tablet 0   furosemide (LASIX) 40 MG tablet Take 1 tablet (40 mg total) by mouth daily. (Patient not taking: Reported on 05/12/2021) 90 tablet 3   potassium chloride SA (KLOR-CON) 20 MEQ tablet Take 1 tablet (20 mEq total) by mouth daily. (Patient not taking: Reported on 05/12/2021) 90 tablet 3   sacubitril-valsartan (ENTRESTO) 49-51 MG Take 1 tablet by mouth 2 (two) times daily. (Patient not taking: Reported on 05/12/2021) 60 tablet 11   No current facility-administered medications for this encounter.   Allergies:   Patient has no known allergies.   Social History:  The patient  reports that he has quit smoking. His smoking use included cigarettes. He smoked an average of .5 packs per day. He has never used smokeless tobacco. He reports current alcohol use. He reports current drug use. Drug: Marijuana.   Family History:  The patient's family history is not on file.   ROS:  Please see the history of present illness.   All other systems are personally reviewed and negative.   Recent Labs: 02/06/2021: Pro B Natriuretic peptide (BNP) 3,234.0 05/06/2021: B Natriuretic Peptide >4,500.0 05/09/2021: ALT 45 05/10/2021: BUN 9; Creatinine, Ser 0.95; Hemoglobin 12.4; Magnesium 1.9; Platelets 221;  Potassium 3.3; Sodium 136  Personally reviewed   BP 120/76    Pulse 77    Wt 64 kg (141 lb)    SpO2 96%    BMI 24.20 kg/m   Wt Readings from Last 3 Encounters:  05/12/21 64 kg (141 lb)  05/10/21 61.6 kg (135 lb 12.9 oz)  03/09/21 66.7 kg (147 lb)   Physical Exam: General:  NAD. No resp difficulty, arrived in [facility], thin HEENT: Normal Neck: Supple. JVP to ear. Carotids 2+ bilat; no bruits. No lymphadenopathy or thryomegaly appreciated. Cor: PMI nondisplaced. Regular rate & rhythm. No rubs, gallops or murmurs. Lungs: Clear Abdomen: Soft, nontender, nondistended. No hepatosplenomegaly. No bruits or masses. Good bowel sounds. Extremities: No cyanosis, clubbing, rash, 2+ BLE edema to knees Neuro: Alert & oriented x 3,  cranial nerves grossly intact. Moves all 4 extremities w/o difficulty. Affect pleasant.  ECG: SR LBBB PR 182 msec (personally reviewed).  ASSESSMENT AND PLAN: 1.  Chronic biventricular heart failure, NICM - Echo 06/2018 EF 25-30%,  cMRI 06/27/2018 with EF 24%, Echo 01/2019 EF 20-25% - Echo 07/15/20 EF 20-25% RV ok - Echo 8/22 with EF 10-15%, moderately to severely reduced RV function, moderate to severe MR, moderate to severe AI. - Echo (12/22): EF 20-25%, global HK, grade II DD, RV moderately reduced, moderate MR - Previously referred to EP for consideration of CRT-D with wide LBBB but didn't follow up. Likely too advanced  - NYHA II-early III, not very active in general. Volume up on exam, unclear if he is taking diuretics correctly. - Given Rx for compression hose. - Restart Entresto 49/51 mg bid. - Restart Lasix 40 mg daily. - Restart digoxin 0.125 mg daily. - Continue carvedilol 3.125 mg bid. - Continue Farxiga 10 mg daily. - Continue Spiro 25 mg daily. - He is not a candidate for advanced therapies. - I will reach out to Paramedicine to see if they can see him early next week and get medications arranged. - I personally set an alarm on his cell phone today for 6 am and  6 pm with hopes that this will remind him to take his meds - BMET/BNP today.   2. CAD - S/P CABG 2017, LHC 06/2018  with occluded LAD normal LCX and RCA. LIMA to LAD and SVG to D2 patent (SVG to D1 occluded) - No s/s angina.  - Continue aspirin 81 mg daily + atorvastatin.      3. LBBB - Previously referred to EP for consideration of CRT-D with wide LBBB but didn't follow up. Likely too advanced currently to benefit.   4. Valvular heart disease - Moderate MR and mild AI on echo 12/22. LVIDd 7.1 cm -Trivial MR and mild AI on echo 2/22.   5.  Former Tobacco Use - No longer smoking.  - Lives with people who do smoke.   6. HTN - Stable today. - Continue current medications.   7. Anemia - Iron deficient. On oral Fe. - Followed by PCP.  8. Illiterate - Cannot read. Able to write numbers. Needs assistance with medications.   - Appreciate Paramedicine's assistance.   Follow up in 2 weeks with APP to reassess volume (will need ReDs, BMET, and Dig level).  Cydney Ok, FNP  05/12/2021 12:27 PM  [facility] [address] [facility] [place] [address] [phone] (office) [phone] (fax)   Patient seen and examined with the above-signed Advanced Practice Provider and/or Housestaff. I personally reviewed laboratory data, imaging studies and relevant notes. I independently examined the patient and formulated the important aspects of the plan. I have edited the note to reflect any of my changes or salient points. I have personally discussed the plan with the patient and/or family.  He continues to struggle with med compliance. Fluid up today. + exertional dyspnea. No CP   General:  Thin appearing. No resp difficulty HEENT: normal Neck: supple.JVP to jaw  Carotids 2+ bilat; no bruits. No lymphadenopathy or thryomegaly appreciated. Cor: PMI nondisplaced. Regular rate & rhythm. No rubs, gallops or murmurs. Lungs:  clear Abdomen: soft, nontender, nondistended. No hepatosplenomegaly. No bruits or masses. Good bowel sounds. Extremities: no cyanosis, clubbing, rash, 2+ edema  Neuro: alert & orientedx3, cranial nerves grossly intact. moves all 4 extremities w/o difficulty. Affect  pleasant  Agree with restarting meds. Will engage paramedicine to assist him. Not candidate for ICD or advanced therapies due to compliance difficulties. Check labs today.   Arvilla Meres, MD  1:22 PM

## 2021-05-12 NOTE — Telephone Encounter (Signed)
CSW helped arranged ride through [facility] to get pt back home.  Burna Sis, LCSW Clinical Social Worker [facility] Desk#: [phone] Cell#: [phone]

## 2021-05-16 ENCOUNTER — Telehealth (HOSPITAL_COMMUNITY): Payer: Self-pay

## 2021-05-16 NOTE — Telephone Encounter (Signed)
Spoke to Jonita Albee who agreed on 9:00 visit tomorrow morning on 12/28. Call complete.

## 2021-05-17 ENCOUNTER — Other Ambulatory Visit (HOSPITAL_COMMUNITY): Payer: Self-pay | Admitting: Family Medicine

## 2021-05-17 ENCOUNTER — Other Ambulatory Visit (HOSPITAL_COMMUNITY): Payer: Self-pay

## 2021-05-17 NOTE — Progress Notes (Signed)
Paramedicine Encounter    Patient ID: Jeremy Powers, male    DOB: [birth year], 65 y.o.   MRN: [id number]  Arrived for home visit for [name] who advised he is feeling good and had no complaints. He denied chest pain, dizziness, swelling or trouble sleeping. He reports he has been taking his medications and that his roommate filled his pill box. I checked them for accuracy and they were not filled correctly so I emptied them out and filled them accordingly.   All meds confirmed in Epic. 2 pill boxes filled for 2 weeks.   We reviewed appointments and I set up transportation with [facility]. Rides were confirmed with patient. He agreed with plan.   Assessment, vitals obtained. Same are as noted. No swelling noted. Weight down 6 lbs this week. He denied any shortness of breath and was able to walk across the living room to the kitchen with no difficulty breathing.   I will see Deavin in two weeks.   Refills: Digoxin (called [facility], needs refills sent in)     Patient Care Team: Saguier, Kateri Mc as PCP - General (Internal Medicine) [name], Bevelyn Buckles, MD as PCP - Cardiology (Cardiology)  Patient Active Problem List   Diagnosis Date Noted   CHF (congestive heart failure) (HCC) 05/08/2021   Acute on chronic systolic CHF (congestive heart failure) (HCC) 05/06/2021   Mitral regurgitation 12/26/2020   COVID-19 virus infection 12/24/2020   CHF exacerbation (HCC) 12/23/2020   Acute on chronic systolic HF (heart failure) (HCC) 06/24/2018   Aortic insufficiency 06/22/2018   Acute on chronic congestive heart failure (HCC) 06/22/2018   Elevated troponin 06/22/2018   CAD (coronary artery disease) 06/22/2018   Left bundle branch block 06/22/2018   History of iron deficiency anemia 06/22/2018   Acute on chronic heart failure (HCC) 06/22/2018   Chronic bilateral low back pain without sciatica 12/12/2015   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 Swallow whole., Disp: 30 tablet, Rfl: 11   atorvastatin (LIPITOR) 40 MG tablet, TAKE ONE TABLET BY MOUTH EVERYDAY TABLET 6PM FOR CHOLESTEROL, Disp: 30 tablet, Rfl: 4   carvedilol (COREG) 3.125 MG tablet, TAKE ONE TABLET BY MOUTH TWO TIMES A DAY WITH MEALS, Disp: 180 tablet, Rfl: 1   digoxin (LANOXIN) 0.125 MG tablet, TAKE ONE TABLET BY MOUTH DAILY (Patient not taking: Reported on 05/12/2021), Disp: 30 tablet, Rfl: 0   FARXIGA 10 MG TABS tablet, TAKE ONE TABLET BY MOUTH DAILY IN THE MORNING BEFORE BREAKFAST, Disp: 30 tablet, Rfl: 4   ferrous sulfate 325 (65 FE) MG tablet, TAKE ONE TABLET BY MOUTH TWO TIMES A DAY WITH MEALS, Disp: 180 tablet, Rfl: 7   furosemide (LASIX) 40 MG tablet, Take 1 tablet (40 mg total) by mouth daily. (Patient not taking: Reported on 05/12/2021), Disp: 90 tablet, Rfl: 3   potassium chloride SA (KLOR-CON) 20 MEQ tablet, Take 1 tablet (20 mEq total) by mouth daily. (Patient not taking: Reported on 05/12/2021), Disp: 90 tablet, Rfl: 3   sacubitril-valsartan (ENTRESTO) 49-51 MG, Take 1 tablet by mouth 2 (two) times daily., Disp: 60 tablet, Rfl: 11   sertraline (ZOLOFT) 25 MG tablet, Take 1 tablet (25 mg total) by mouth daily., Disp: 30 tablet, Rfl: 5   spironolactone (ALDACTONE) 25 MG tablet, TAKE ONE TABLET BY  MOUTH AT BEDTIME, Disp: 30 tablet, Rfl: 10 No Known Allergies   Social History   Socioeconomic History   Marital status: Single    Spouse name: Not on file   Number of children: Not on file   Years of education: Not on file   Highest education level: Not on file  Occupational History   Not on file  Tobacco Use   Smoking status: Former    Packs/day: 0.50    Types: Cigarettes   Smokeless tobacco: Never  Vaping Use   Vaping Use: Never used  Substance and Sexual Activity   Alcohol use: Yes    Comment: daily    Drug use: Yes    Types: Marijuana   Sexual activity: Not on file  Other Topics Concern   Not on file  Social History Narrative   Not on file   Social Determinants of Health   Financial Resource Strain: Not on file  Food Insecurity: Not on file  Transportation Needs: No Transportation Needs   Lack of Transportation (Medical): No   Lack of Transportation (Non-Medical): No  Physical Activity: Not on file  Stress: Not on file  Social Connections: Not on file  Intimate Partner Violence: Not on file    Physical Exam Vitals reviewed.  Constitutional:      Appearance: Normal appearance. He is normal weight.  HENT:     Head: Normocephalic.     Nose: Nose normal.     Mouth/Throat:     Mouth: Mucous membranes are moist.     Pharynx: Oropharynx is clear.  Eyes:     Conjunctiva/sclera: Conjunctivae normal.     Pupils: Pupils are equal, round, and reactive to light.  Cardiovascular:     Rate and Rhythm: Normal rate and regular rhythm.     Pulses: Normal pulses.     Heart sounds: Normal heart sounds.  Pulmonary:     Effort: Pulmonary effort is normal.     Breath sounds: Normal breath sounds.  Abdominal:     General: Abdomen is flat.     Palpations: Abdomen is soft.  Musculoskeletal:        General: No swelling. Normal range of motion.     Cervical back: Normal range of motion.     Right lower leg: No edema.     Left lower leg: No edema.  Skin:    General: Skin is warm and dry.     Capillary Refill: Capillary refill takes less than 2 seconds.  Neurological:     General: No focal deficit present.     Mental Status: He is alert. Mental status is at baseline.  Psychiatric:        Mood and Affect: Mood normal.        Future Appointments  Date Time Provider Department Center  05/19/2021 10:00 AM [name] [facility]  05/26/2021 10:30 AM [facility] PA/NP [facility] None     ACTION: Home visit completed

## 2021-05-19 ENCOUNTER — Telehealth: Payer: Self-pay | Admitting: Medical

## 2021-05-19 ENCOUNTER — Inpatient Hospital Stay: Payer: Medicare Other | Admitting: Medical

## 2021-05-19 NOTE — Telephone Encounter (Signed)
Caller/Agency: [facility] : Julieanne Cotton Number: [phone] Requesting OT/PT/Skilled Nursing/Social Work/Speech Therapy: PT/ SN/OT Frequency:  1x week for 9 weeks PT/OT : eval and treat with MS consult.

## 2021-05-19 NOTE — Telephone Encounter (Signed)
Pt no showed appointment today. Pt called. LVM to reschedule.

## 2021-05-19 NOTE — Telephone Encounter (Signed)
Verbal given 

## 2021-05-22 ENCOUNTER — Other Ambulatory Visit (HOSPITAL_COMMUNITY): Payer: Self-pay | Admitting: Internal Medicine

## 2021-05-23 ENCOUNTER — Telehealth (HOSPITAL_COMMUNITY): Payer: Self-pay

## 2021-05-23 NOTE — Telephone Encounter (Signed)
PCP appointment rescheduled for 1/0 at 13:00 Ride pick up at 12:13.  I texted this to his daughter in law Mickie Bail who helps with his care.

## 2021-05-23 NOTE — Telephone Encounter (Signed)
Attempted to call Jeremy Powers as his girlfriend [name] had called me over the weekend reporting he had missed his appointment with his PCP due to transportation not showing up.   I will assist him in rescheduling same and I left voicemail reminding him that he has an appointment on Friday in HF clinic and ride is set up.   I will try to call back again Thursday to remind him.   Call attempt complete.

## 2021-05-25 ENCOUNTER — Telehealth (HOSPITAL_COMMUNITY): Payer: Self-pay

## 2021-05-25 NOTE — Telephone Encounter (Signed)
Called and left patient a detailed message to confirm/remind patient of their appointment at the [facility] on 05/26/21.  ° °  ° °

## 2021-05-25 NOTE — Progress Notes (Incomplete)
Advanced Heart Failure Clinic Note  Date:  05/25/2021   PCP:  [name], PA-C  Cardiologist:  Arvilla Meres, MD Primary HF: Dr. [name]  Chief Complaint: HF Follow up   History of Present Illness: Jeremy Powers is a 66 y.o.male CAD s/p PCI to LAD in 2015 and 2016, s/p CABG 2017, systolic HF EF 20-25%, microcytic/iron deficiency anemia followed by hematology and GI, LBBB, and tobacco use. Illiterate and cannot read.   In 2/20 he presented to [facility] ED with SOB, cough, and orthopnea in setting of med non-compliance. Was unable to afford copays with Medicaid. He was tachycardic and tachypneic on arrival, and was transferred to [facility] 06/22/18 for further evaluation. Echo showed newly decreased EF of 25-30% from 50-55% in 12/2017.  Underwent R/LHC showing severe 1 v disease with occluded LAD within previous stent. RCA and LCX normal. LIMA to LAD and SVG to D2 patent. Cath also showed low filling pressures with moderately low cardiac output. Meds adjusted as tolerated.   OV 04/05/20 returns for HF follow up after not being seen in > 1 year. Lives with his girlfriend. Followed by HF Paramedicine. Lasix was stopped & [name] was started.  Echo 2/22 LVEF 20-25%, RV ok.    3/22 he was discharged from community paramedicine program and bubble pill packs arranged.   Seen in the ED 10/25/20 for CHF exacerbation in the setting of medication non-compliance. He was diuresed with IV lasix and discharged home.  Sent to the ED 12/23/20 by his PCP and found to be A/C CHF and COVID +. Repeat echo EF 10-15%. Diuresed 13 lbs, beta blocker stopped with acute decompensation, digoxin added, and Entresto held with low BP with plans to restart as able.  Admitted 12/22 with A/C CHF after stopping his medications a month prior. Echo showed EF 20-25%. He was diuresed with IV lasix and GDMT restarted.   Today he returns for post hospital HF follow up. Breathing is about the same, some SOB with stairs, but does OK if  he takes his time. Gets around OK at home. He says his swelling is better since hospitalization. He does not know what medications he is taking and knows that he has not taken all of his prescribed meds since discharge. Denies CP, dizziness, edema, or PND/Orthopnea. Appetite ok. No fever or chills. He does not weigh at home.  He lives with his girlfriend and daughter. Has been followed by paramedicine.  Cardiac Studies: - Echo 12/22: EF 20-25%, global HK, grade II DD, RV moderately reduced, moderate MR - Echo 8/22: EF 10-15%, Repeat echo showed EF 10-15%, severe global hypokinesis with septal akinesis, LV severely dilated, grade II DD, RV fxn moderate to severely reduced, moderate to severe MR, moderate to severe AI.  - Echo 06/2920: EF 20-25%, RV ok  - Echo 01/2019: EF 20-25%  - Echo 06/2018: EF 20-25%  - cMRI 06/27/18: EF 24%. Normal RV   - RHC/LHC 06/24/2018  RA = 1 RV = 19/3 PA = 24/5 (14) PCW = 7 Fick cardiac output/index = 4.0/2.5 PVR = 1.9 WU Ao sat = 95% PA sat = 68%, 71%   Assessment: 1. Severe 1-vessel CAD with occluded LAD within previous stent 2. Normal RCA and LCX 3. LIMA to LAD widely patent 4. SVG - D2 widely patent 5. SVG - D1 likely occluded (grafts not marked)  ROS: All systems negative except as listed in HPI, PMH and Problem List.  Past Medical History:  Diagnosis  Date   CHF (congestive heart failure) (HCC)    Cirrhosis (HCC)    Coronary artery disease    Hypertension    Pancreatitis    Past Surgical History:  Procedure Laterality Date   CARDIAC SURGERY     RIGHT/LEFT HEART CATH AND CORONARY/GRAFT ANGIOGRAPHY N/A 06/24/2018   Procedure: RIGHT/LEFT HEART CATH AND CORONARY/GRAFT ANGIOGRAPHY;  Surgeon: Dolores Patty, MD;  Location: [facility] INVASIVE CV LAB;  Service: Cardiovascular;  Laterality: N/A;   Current Outpatient Medications  Medication Sig Dispense Refill   aspirin EC 81 MG EC tablet Take 1 tablet (81 mg total) by mouth daily. Swallow whole. 30 tablet 11    atorvastatin (LIPITOR) 40 MG tablet TAKE ONE TABLET BY MOUTH EVERYDAY TABLET 6PM FOR CHOLESTEROL 30 tablet 3   carvedilol (COREG) 3.125 MG tablet TAKE ONE TABLET BY MOUTH TWO TIMES A DAY WITH MEALS 180 tablet 1   digoxin (LANOXIN) 0.125 MG tablet TAKE ONE TABLET BY MOUTH DAILY 30 tablet 6   FARXIGA 10 MG TABS tablet TAKE ONE TABLET BY MOUTH DAILY IN THE MORNING BEFORE BREAKFAST 30 tablet 3   ferrous sulfate 325 (65 FE) MG tablet TAKE ONE TABLET BY MOUTH TWO TIMES A DAY WITH MEALS 180 tablet 7   furosemide (LASIX) 40 MG tablet Take 1 tablet (40 mg total) by mouth daily. 90 tablet 3   potassium chloride SA (KLOR-CON) 20 MEQ tablet Take 1 tablet (20 mEq total) by mouth daily. 90 tablet 3   sacubitril-valsartan (ENTRESTO) 49-51 MG Take 1 tablet by mouth 2 (two) times daily. 60 tablet 11   sertraline (ZOLOFT) 25 MG tablet Take 1 tablet (25 mg total) by mouth daily. 30 tablet 5   spironolactone (ALDACTONE) 25 MG tablet TAKE ONE TABLET BY MOUTH AT BEDTIME 30 tablet 10   No current facility-administered medications for this visit.   Allergies:   Patient has no known allergies.   Social History:  The patient  reports that he has quit smoking. His smoking use included cigarettes. He smoked an average of .5 packs per day. He has never used smokeless tobacco. He reports current alcohol use. He reports current drug use. Drug: Marijuana.   Family History:  The patient's family history is not on file.   ROS:  Please see the history of present illness.   All other systems are personally reviewed and negative.   Recent Labs: 02/06/2021: Pro B Natriuretic peptide (BNP) 3,234.0 05/09/2021: ALT 45 05/10/2021: Hemoglobin 12.4; Magnesium 1.9; Platelets 221 05/12/2021: B Natriuretic Peptide >4,500.0; BUN <5; Creatinine, Ser 0.79; Potassium 4.1; Sodium 133  Personally reviewed   There were no vitals taken for this visit.  Wt Readings from Last 3 Encounters:  05/17/21 60.2 kg (132 lb 12.8 oz)  05/12/21 64  kg (141 lb)  05/10/21 61.6 kg (135 lb 12.9 oz)   Physical Exam: General:  NAD. No resp difficulty, arrived in [facility], thin HEENT: Normal Neck: Supple. JVP to ear. Carotids 2+ bilat; no bruits. No lymphadenopathy or thryomegaly appreciated. Cor: PMI nondisplaced. Regular rate & rhythm. No rubs, gallops or murmurs. Lungs: Clear Abdomen: Soft, nontender, nondistended. No hepatosplenomegaly. No bruits or masses. Good bowel sounds. Extremities: No cyanosis, clubbing, rash, 2+ BLE edema to knees Neuro: Alert & oriented x 3, cranial nerves grossly intact. Moves all 4 extremities w/o difficulty. Affect pleasant.  ECG: SR LBBB PR 182 msec (personally reviewed).  ASSESSMENT AND PLAN: 1.  Chronic biventricular heart failure, NICM - Echo 06/2018 EF 25-30%,  cMRI 06/27/2018 with  EF 24%, Echo 01/2019 EF 20-25% - Echo 07/15/20 EF 20-25% RV ok - Echo 8/22 with EF 10-15%, moderately to severely reduced RV function, moderate to severe MR, moderate to severe AI. - Echo (12/22): EF 20-25%, global HK, grade II DD, RV moderately reduced, moderate MR - Previously referred to EP for consideration of CRT-D with wide LBBB but didn't follow up. Likely too advanced  - NYHA II-early III, not very active in general. Volume up on exam, unclear if he is taking diuretics correctly. - Given Rx for compression hose. - Restart Entresto 49/51 mg bid. - Restart Lasix 40 mg daily. - Restart digoxin 0.125 mg daily. - Continue carvedilol 3.125 mg bid. - Continue Farxiga 10 mg daily. - Continue Spiro 25 mg daily. - He is not a candidate for advanced therapies. - I will reach out to Paramedicine to see if they can see him early next week and get medications arranged. - I personally set an alarm on his cell phone today for 6 am and 6 pm with hopes that this will remind him to take his meds - BMET/BNP today.   2. CAD - S/P CABG 2017, LHC 06/2018  with occluded LAD normal LCX and RCA. LIMA to LAD and SVG to D2 patent (SVG to D1  occluded) - No s/s angina.  - Continue aspirin 81 mg daily + atorvastatin.      3. LBBB - Previously referred to EP for consideration of CRT-D with wide LBBB but didn't follow up. Likely too advanced currently to benefit.   4. Valvular heart disease - Moderate MR and mild AI on echo 12/22. LVIDd 7.1 cm -Trivial MR and mild AI on echo 2/22.   5.  Former Tobacco Use - No longer smoking.  - Lives with people who do smoke.   6. HTN - Stable today. - Continue current medications.   7. Anemia - Iron deficient. On oral Fe. - Followed by PCP.  8. Illiterate - Cannot read. Able to write numbers. Needs assistance with medications.   - Appreciate Paramedicine's assistance.   Follow up in 2 weeks with APP to reassess volume (will need ReDs, BMET, and Dig level).  Cydney Ok, FNP  05/25/2021 7:56 PM  [facility] [address] [facility] [place] [address] [phone] (office) [phone] (fax)   Patient seen and examined with the above-signed Advanced Practice Provider and/or Housestaff. I personally reviewed laboratory data, imaging studies and relevant notes. I independently examined the patient and formulated the important aspects of the plan. I have edited the note to reflect any of my changes or salient points. I have personally discussed the plan with the patient and/or family.  He continues to struggle with med compliance. Fluid up today. + exertional dyspnea. No CP   General:  Thin appearing. No resp difficulty HEENT: normal Neck: supple.JVP to jaw  Carotids 2+ bilat; no bruits. No lymphadenopathy or thryomegaly appreciated. Cor: PMI nondisplaced. Regular rate & rhythm. No rubs, gallops or murmurs. Lungs: clear Abdomen: soft, nontender, nondistended. No hepatosplenomegaly. No bruits or masses. Good bowel sounds. Extremities: no cyanosis, clubbing, rash, 2+ edema  Neuro: alert & orientedx3, cranial nerves  grossly intact. moves all 4 extremities w/o difficulty. Affect pleasant  Agree with restarting meds. Will engage paramedicine to assist him. Not candidate for ICD or advanced therapies due to compliance difficulties. Check labs today.   Jacklynn Ganong, FNP  7:56 PM

## 2021-05-26 ENCOUNTER — Encounter (HOSPITAL_COMMUNITY): Payer: Medicare Other

## 2021-05-29 ENCOUNTER — Telehealth (HOSPITAL_COMMUNITY): Payer: Self-pay

## 2021-05-29 ENCOUNTER — Inpatient Hospital Stay: Payer: Medicare Other | Admitting: Medical

## 2021-05-29 NOTE — Telephone Encounter (Signed)
Spoke to [employer] and reminded them of [name] appointment today at 1:00 with PCP and that his ride would be there around 12:14 to pick him up. He agreed with same. I will see him on Thursday.

## 2021-06-01 ENCOUNTER — Telehealth (HOSPITAL_COMMUNITY): Payer: Self-pay

## 2021-06-01 NOTE — Telephone Encounter (Signed)
Attempted to reach Jeremy Powers to have home visit today with no success.   I texted his girlfriend/roommate Jeremy Powers and his daughter Jeremy Powers with no response.   I left messages for him to call me back and text messages for Jeremy Powers and Jeremy Powers reminded them of his appointment and transportation for tomorrow.   I will attempt to follow up next week.

## 2021-06-02 ENCOUNTER — Ambulatory Visit (INDEPENDENT_AMBULATORY_CARE_PROVIDER_SITE_OTHER): Payer: Medicare Other | Admitting: Medical

## 2021-06-02 ENCOUNTER — Ambulatory Visit (HOSPITAL_BASED_OUTPATIENT_CLINIC_OR_DEPARTMENT_OTHER)
Admission: RE | Admit: 2021-06-02 | Discharge: 2021-06-02 | Disposition: A | Discharge status: Home / Self Care | Payer: Medicare Other | Source: Ambulatory Visit | Attending: Medical | Admitting: Medical

## 2021-06-02 ENCOUNTER — Other Ambulatory Visit: Payer: Self-pay

## 2021-06-02 VITALS — BP 116/54 | HR 65 | Temp 98.3°F | Resp 16 | Ht 64.0 in | Wt 127.8 lb

## 2021-06-02 DIAGNOSIS — D649 Anemia, unspecified: Secondary | ICD-10-CM | POA: Diagnosis not present

## 2021-06-02 DIAGNOSIS — I10 Essential (primary) hypertension: Secondary | ICD-10-CM

## 2021-06-02 DIAGNOSIS — I509 Heart failure, unspecified: Secondary | ICD-10-CM | POA: Diagnosis present

## 2021-06-02 LAB — CBC WITH DIFFERENTIAL/PLATELET
Basophils Absolute: 0 10*3/uL (ref 0.0–0.1)
Basophils Relative: 1.4 % (ref 0.0–3.0)
Eosinophils Absolute: 0.1 10*3/uL (ref 0.0–0.7)
Eosinophils Relative: 4.3 % (ref 0.0–5.0)
HCT: 37.8 % — ABNORMAL LOW (ref 39.0–52.0)
Hemoglobin: 12.1 g/dL — ABNORMAL LOW (ref 13.0–17.0)
Lymphocytes Relative: 31.6 % (ref 12.0–46.0)
Lymphs Abs: 1.1 10*3/uL (ref 0.7–4.0)
MCHC: 32.1 g/dL (ref 30.0–36.0)
MCV: 90.9 fl (ref 78.0–100.0)
Monocytes Absolute: 0.6 10*3/uL (ref 0.1–1.0)
Monocytes Relative: 18.1 % — ABNORMAL HIGH (ref 3.0–12.0)
Neutro Abs: 1.5 10*3/uL (ref 1.4–7.7)
Neutrophils Relative %: 44.6 % (ref 43.0–77.0)
Platelets: 201 10*3/uL (ref 150.0–400.0)
RBC: 4.16 Mil/uL — ABNORMAL LOW (ref 4.22–5.81)
RDW: 18.8 % — ABNORMAL HIGH (ref 11.5–15.5)
WBC: 3.3 10*3/uL — ABNORMAL LOW (ref 4.0–10.5)

## 2021-06-02 LAB — COMPREHENSIVE METABOLIC PANEL
ALT: 11 U/L (ref 0–53)
AST: 28 U/L (ref 0–37)
Albumin: 3.8 g/dL (ref 3.5–5.2)
Alkaline Phosphatase: 74 U/L (ref 39–117)
BUN: 11 mg/dL (ref 6–23)
CO2: 28 mEq/L (ref 19–32)
Calcium: 8.7 mg/dL (ref 8.4–10.5)
Chloride: 105 mEq/L (ref 96–112)
Creatinine, Ser: 0.74 mg/dL (ref 0.40–1.50)
GFR: 93.83 mL/min (ref >60.00)
Glucose, Bld: 77 mg/dL (ref 70–99)
Potassium: 3.9 mEq/L (ref 3.5–5.1)
Sodium: 139 mEq/L (ref 135–145)
Total Bilirubin: 0.7 mg/dL (ref 0.2–1.2)
Total Protein: 8 g/dL (ref 6.0–8.3)

## 2021-06-02 LAB — BRAIN NATRIURETIC PEPTIDE: Pro B Natriuretic peptide (BNP): 2394 pg/mL — ABNORMAL HIGH (ref 0.0–100.0)

## 2021-06-02 LAB — MAGNESIUM: Magnesium: 2 mg/dL (ref 1.5–2.5)

## 2021-06-02 NOTE — Patient Instructions (Addendum)
Follow-up from hospitalization due to decompensated heart failure.  Now patient appears clinically stable with his weight down and O2 sat at 99%.  No pedal edema.  Glad to know that home health RN is coming out to help with medication management.  Patient also expresses that he is doing well presently as long as he is taking the medication.  Continue medications on DC med list summary.  Will get CMP, CBC, BNP and chest x-ray.  I did go ahead and placed referral back to patient's cardiologist as that was listed on the discharge summary but not place.  Low magnesium in the hospital also as active labs today.  Hypertension well-controlled.  Follow-up date to be determined after lab and imaging review.

## 2021-06-02 NOTE — Addendum Note (Signed)
Addended by: Gwenevere Abbot on: 06/02/2021 09:48 AM   Modules accepted: Orders

## 2021-06-02 NOTE — Progress Notes (Signed)
Subjective:    Patient ID: Jeremy Powers, male    DOB: 05-07-1956, 66 y.o.   MRN: [id number]  HPI  Pt in for follow up for hospitalization. He state he feels well today.  Below Dc summary.  Admit date: 05/06/2021   Discharge date: 05/10/2021   Admitted From: Home.   Disposition:  Home health services.   Recommendations for Outpatient Follow-up:  Follow up with PCP in 1-2 weeks. Please obtain BMP/CBC in one week. Follow-up with cardiology as scheduled in CHF clinic. Advised to continue prior home medications.  Brief Summary / Hospital Course: This 66 y.o. male with PMH significant for chronic systolic and diastolic heart failure, hypertension, hyperlipidemia, iron deficiency anemia,  history of coronary artery disease status post PCI and CABG  presented to the hospital with increasing shortness of breath, fatigue.  His latest ejection fraction from 4 months back was estimated at 10 to 15%.  Patient states that he does have plenty of medications at home including refills but he has not been taking them because of difficulty reading the labels. He states that he is not been taking his medication for the last 1 month.   In the ED, patient had blood pressure 147/79 on presentation with heart rate of 101.  Patient was afebrile.  Initial labs showed a hypokalemia with a potassium of 3.4.  Creatinine was 0.9.  Digoxin level was noted to be less than 0.2.  BNP was elevated at 4500.  COVID and influenza was negative.  Patient was then considered for admission to the hospital for decompensated heart failure. Patient was admitted for decompensated heart failure.  Cardiology was consulted medications were adjusted.  2D echocardiogram showed improved LVEF to 20 to 25% , is still severely reduced.  Elevated troponins were could be due to demand ischemia.  No active ischemic event. Patient was continued on all home medications.  Patient is cleared from cardiology, appears to be euvolemic on discharge.   Patient will follow up with cardiology,  appointment has been made.  Patient wants to be discharged.  Home health services been arranged to help with medication management.   He was managed for below problems.   Discharge Diagnoses:  Principal Problem:   Acute on chronic systolic CHF (congestive heart failure) (HCC) Active Problems:   Hyperlipidemia   Iron deficiency anemia   CHF (congestive heart failure) (HCC)   Acute on chronic systolic heart failure: Patient presented with orthopnea, SOB on exertion with gross peripheral edema. BNP on presentation more than 4500.   Patient has been noncompliant with medication due to inability to read the labels. Patient has advanced heart failure and follows up with cardiology as outpatient, last visit October 2022.  Continue with strict intake output charting and daily weights.  CHF protocol. 2D  Echo: LVEF slightly improved to 20 to 25% , still severely reduced. Continue Clifton Custard, digoxin, Spironolactone and Lasix IV daily.  Digoxin level was subtherapeutic.  Advanced heart failure team has been consulted.   Continue fluid restriction.  Cardiologist recommended continue current management. Patient will need TOC assistance on discharge, PT OT evaluation.    Elevated troponin: Patient denies any chest pain, appears volume overloaded. Likely demand ischemia due to decompensated heart failure. Patient has severe systolic dysfunction.  2D echo shows improving LVEF. Continue aspirin and statin.   History of CAD s/p CABG:  No acute chest pain at this time.  EKG with left bundle branch block.  Unchanged from prior.   Hypokalemia:  Replaced.  Continue to monitor   Hypomagnesemia: Replaced, Continue to monitor   Essential hypertension: Continue Entresto and IV Lasix.  Continue Coreg   Disposition likely home with home health.  Patient might need home health RN to supervise his medications on discharge.  Patient does have plenty of  medications at home please do not  prescribe new medication unless its not on the home med list."  Pt does have home health nurse coming out to help him with med compliance. Also having therapy done.  He states breathing well now. He states along as he takes his medicine feeling well. No chest pain reported.  On review bnp 05-12-21 was greater than 4500. Looks like Dr. [name] reviewed that.     Review of Systems  Constitutional:  Negative for chills, fatigue and fever.  HENT:  Negative for congestion, drooling, ear pain and hearing loss.   Respiratory:  Negative for cough, choking, chest tightness, shortness of breath, wheezing and stridor.        Clinically improved.  Cardiovascular:  Negative for chest pain and palpitations.  Gastrointestinal:  Negative for abdominal pain, blood in stool, constipation and diarrhea.  Genitourinary:  Negative for dysuria and frequency.  Musculoskeletal:  Negative for back pain, gait problem, myalgias and neck pain.  Neurological:  Negative for dizziness, speech difficulty, weakness, numbness and headaches.  Hematological:  Negative for adenopathy. Does not bruise/bleed easily.  Psychiatric/Behavioral:  Negative for behavioral problems.     Past Medical History:  Diagnosis Date   CHF (congestive heart failure) (HCC)    Cirrhosis (HCC)    Coronary artery disease    Hypertension    Pancreatitis      Social History   Socioeconomic History   Marital status: Single    Spouse name: Not on file   Number of children: Not on file   Years of education: Not on file   Highest education level: Not on file  Occupational History   Not on file  Tobacco Use   Smoking status: Former    Packs/day: 0.50    Types: Cigarettes   Smokeless tobacco: Never  Vaping Use   Vaping Use: Never used  Substance and Sexual Activity   Alcohol use: Yes    Comment: daily   Drug use: Yes    Types: Marijuana   Sexual activity: Not on file  Other Topics Concern    Not on file  Social History Narrative   Not on file   Social Determinants of Health   Financial Resource Strain: Not on file  Food Insecurity: Not on file  Transportation Needs: No Transportation Needs   Lack of Transportation (Medical): No   Lack of Transportation (Non-Medical): No  Physical Activity: Not on file  Stress: Not on file  Social Connections: Not on file  Intimate Partner Violence: Not on file    Past Surgical History:  Procedure Laterality Date   CARDIAC SURGERY     RIGHT/LEFT HEART CATH AND CORONARY/GRAFT ANGIOGRAPHY N/A 06/24/2018   Procedure: RIGHT/LEFT HEART CATH AND CORONARY/GRAFT ANGIOGRAPHY;  Surgeon: Dolores Patty, MD;  Location: [facility] INVASIVE CV LAB;  Service: Cardiovascular;  Laterality: N/A;    Family History  Problem Relation Age of Onset   Iron deficiency Neg Hx     No Known Allergies  Current Outpatient Medications on File Prior to Visit  Medication Sig Dispense Refill   aspirin EC 81 MG EC tablet Take 1 tablet (81 mg total) by mouth daily. Swallow whole. 30 tablet  11   atorvastatin (LIPITOR) 40 MG tablet TAKE ONE TABLET BY MOUTH EVERYDAY TABLET 6PM FOR CHOLESTEROL 30 tablet 3   carvedilol (COREG) 3.125 MG tablet TAKE ONE TABLET BY MOUTH TWO TIMES A DAY WITH MEALS 180 tablet 1   digoxin (LANOXIN) 0.125 MG tablet TAKE ONE TABLET BY MOUTH DAILY 30 tablet 6   FARXIGA 10 MG TABS tablet TAKE ONE TABLET BY MOUTH DAILY IN THE MORNING BEFORE BREAKFAST 30 tablet 3   ferrous sulfate 325 (65 FE) MG tablet TAKE ONE TABLET BY MOUTH TWO TIMES A DAY WITH MEALS 180 tablet 7   furosemide (LASIX) 40 MG tablet Take 1 tablet (40 mg total) by mouth daily. 90 tablet 3   potassium chloride SA (KLOR-CON) 20 MEQ tablet Take 1 tablet (20 mEq total) by mouth daily. 90 tablet 3   sacubitril-valsartan (ENTRESTO) 49-51 MG Take 1 tablet by mouth 2 (two) times daily. 60 tablet 11   sertraline (ZOLOFT) 25 MG tablet Take 1 tablet (25 mg total) by mouth daily. 30 tablet 5    spironolactone (ALDACTONE) 25 MG tablet TAKE ONE TABLET BY MOUTH AT BEDTIME 30 tablet 10   No current facility-administered medications on file prior to visit.    BP (!) 116/54    Pulse 65    Temp 98.3 F (36.8 C)    Resp 16    Ht 5\' 4"  (1.626 m)    Wt 127 lb 12.8 oz (58 kg)    SpO2 99%    BMI 21.94 kg/m        Objective:   Physical Exam  General Mental Status- Alert. General Appearance- Not in acute distress.   Skin General: Color- Normal Color. Moisture- Normal Moisture.  Neck Carotid Arteries- Normal color. Moisture- Normal Moisture. No carotid bruits. No JVD.  Chest and Lung Exam Auscultation: Breath Sounds:-Normal.  Cardiovascular Auscultation:Rythm- Regular. Murmurs & Other Heart Sounds:Auscultation of the heart reveals- No Murmurs.  Abdomen Inspection:-Inspeection Normal. Palpation/Percussion:Note:No mass. Palpation and Percussion of the abdomen reveal- Non Tender, Non Distended + BS, no rebound or guarding.  Neurologic Cranial Nerve exam:- CN III-XII intact(No nystagmus), symmetric smile. Strength:- 5/5 equal and symmetric strength both upper and lower extremities.   Lower ext- symmetric and no pedal edema. Negative homans signs.    Assessment & Plan:   Patient Instructions  Follow-up from hospitalization due to decompensated heart failure.  Now patient appears clinically stable with his weight down and O2 sat at 99%.  No pedal edema.  Glad to know that home health RN is coming out to help with medication management.  Patient also expresses that he is doing well presently as long as he is taking the medication.  Continue medications on DC med list summary.  Will get CMP, CBC, BNP and chest x-ray.  I did go ahead and placed referral back to patient's cardiologist as that was listed on the discharge summary but not place.  Low magnesium in the hospital also as active labs today.  Hypertension well-controlled.  Follow-up date to be determined after lab and  imaging review.    , PA-C

## 2021-06-07 ENCOUNTER — Telehealth (HOSPITAL_COMMUNITY): Payer: Self-pay | Admitting: Licensed Clinical Social Worker

## 2021-06-07 NOTE — Telephone Encounter (Signed)
HF Paramedicine Team Based Care Meeting  HF MD- NA  HF NP - [place] NP-C   [facility] admit within the last 30 days for heart failure? no  Medications concerns? Dependent on paramedicine to manage at this time- was on bubble packs but had too many changes so went back on box- not good with appt compliance either- don't think there is SDOH reason behind this  Eligible for discharge? Hopeful to get back on bubble packs once medications are stable  Jorge Ny, [facility] Desk#: [phone] Cell#: [phone]

## 2021-06-08 ENCOUNTER — Telehealth (HOSPITAL_COMMUNITY): Payer: Self-pay

## 2021-06-08 NOTE — Telephone Encounter (Signed)
Attempted to reach Jeremy Powers via his cell phone and his girlfriends phone Jeremy Powers) with no success. I will continue to reach out to schedule home paramedicine visit.

## 2021-06-12 ENCOUNTER — Telehealth (HOSPITAL_COMMUNITY): Payer: Self-pay

## 2021-06-12 NOTE — Telephone Encounter (Signed)
Left message for Jeremy Powers to set up home visit for today. I also set up his ride for his appointment in clinic on Wednesday. His ride will pick him up from home at 0845 and his appointment is at HF clinic at 0930.   I will continue to attempt to reach Jeremy Powers.

## 2021-06-13 ENCOUNTER — Telehealth (HOSPITAL_COMMUNITY): Payer: Self-pay

## 2021-06-13 NOTE — Telephone Encounter (Signed)
Called and spoke to patient's girlfriend [name] (per patient's request) to confirm/remind patient of their appointment at the [facility] on 06/14/20.   Patient reminded to bring all medications and/or complete list.  Confirmed patient has transportation. Gave directions, instructed to utilize valet parking.  Confirmed appointment prior to ending call.

## 2021-06-14 ENCOUNTER — Ambulatory Visit (HOSPITAL_COMMUNITY)
Admission: RE | Admit: 2021-06-14 | Discharge: 2021-06-14 | Disposition: A | Discharge status: Home / Self Care | Payer: Medicare Other | Source: Ambulatory Visit | Attending: Family Medicine | Admitting: Family Medicine

## 2021-06-14 ENCOUNTER — Other Ambulatory Visit: Payer: Self-pay

## 2021-06-14 ENCOUNTER — Encounter (HOSPITAL_COMMUNITY): Payer: Self-pay

## 2021-06-14 VITALS — BP 152/80 | HR 76 | Wt 134.0 lb

## 2021-06-14 DIAGNOSIS — Z955 Presence of coronary angioplasty implant and graft: Secondary | ICD-10-CM | POA: Insufficient documentation

## 2021-06-14 DIAGNOSIS — Z951 Presence of aortocoronary bypass graft: Secondary | ICD-10-CM | POA: Insufficient documentation

## 2021-06-14 DIAGNOSIS — I080 Rheumatic disorders of both mitral and aortic valves: Secondary | ICD-10-CM | POA: Diagnosis not present

## 2021-06-14 DIAGNOSIS — I428 Other cardiomyopathies: Secondary | ICD-10-CM | POA: Diagnosis not present

## 2021-06-14 DIAGNOSIS — I340 Nonrheumatic mitral (valve) insufficiency: Secondary | ICD-10-CM | POA: Diagnosis not present

## 2021-06-14 DIAGNOSIS — Z87891 Personal history of nicotine dependence: Secondary | ICD-10-CM | POA: Insufficient documentation

## 2021-06-14 DIAGNOSIS — I5022 Chronic systolic (congestive) heart failure: Secondary | ICD-10-CM

## 2021-06-14 DIAGNOSIS — I5082 Biventricular heart failure: Secondary | ICD-10-CM | POA: Insufficient documentation

## 2021-06-14 DIAGNOSIS — I110 Hypertensive heart disease with heart failure: Secondary | ICD-10-CM | POA: Insufficient documentation

## 2021-06-14 DIAGNOSIS — I447 Left bundle-branch block, unspecified: Secondary | ICD-10-CM | POA: Diagnosis not present

## 2021-06-14 DIAGNOSIS — D509 Iron deficiency anemia, unspecified: Secondary | ICD-10-CM | POA: Diagnosis not present

## 2021-06-14 DIAGNOSIS — I2510 Atherosclerotic heart disease of native coronary artery without angina pectoris: Secondary | ICD-10-CM | POA: Diagnosis not present

## 2021-06-14 DIAGNOSIS — Z550 Illiteracy and low-level literacy: Secondary | ICD-10-CM

## 2021-06-14 DIAGNOSIS — I10 Essential (primary) hypertension: Secondary | ICD-10-CM

## 2021-06-14 DIAGNOSIS — K7460 Unspecified cirrhosis of liver: Secondary | ICD-10-CM | POA: Diagnosis not present

## 2021-06-14 MED ORDER — ENTRESTO 97-103 MG PO TABS: 1.0000 | ORAL_TABLET | Freq: Two times a day (BID) | ORAL | 11 refills | Status: DC

## 2021-06-14 NOTE — Progress Notes (Signed)
Advanced Heart Failure Clinic Note  Date:  06/14/2021   PCP:  Mackie Pai, PA-C  Cardiologist:  Glori Bickers, MD Primary HF: Dr. Haroldine Laws  Chief Complaint: HF Follow up   History of Present Illness: Jeremy Powers is a 66 y.o.male CAD s/p PCI to LAD in 2015 and 2016, s/p CABG 0000000, systolic HF EF 0000000, microcytic/iron deficiency anemia followed by hematology and GI, LBBB, and tobacco use. Illiterate and cannot read.   In 2/20 he presented to [facility] ED with SOB, cough, and orthopnea in setting of med non-compliance. Was unable to afford copays with Medicaid. He was tachycardic and tachypneic on arrival, and was transferred to [facility] 06/22/18 for further evaluation. Echo showed newly decreased EF of 25-30% from 50-55% in 12/2017.  Underwent R/LHC showing severe 1 v disease with occluded LAD within previous stent. RCA and LCX normal. LIMA to LAD and SVG to D2 patent. Cath also showed low filling pressures with moderately low cardiac output. Meds adjusted as tolerated.   OV 04/05/20 returns for HF follow up after not being seen in > 1 year. Lives with his girlfriend. Followed by HF Paramedicine. Lasix was stopped & Wilder Glade was started.  Echo 2/22 LVEF 20-25%, RV ok.    3/22 he was discharged from community paramedicine program and bubble pill packs arranged.   Seen in the ED 10/25/20 for CHF exacerbation in the setting of medication non-compliance. He was diuresed with IV lasix and discharged home. Sent to the ED 12/23/20 by his PCP and found to be A/C CHF and COVID +. Repeat echo EF 10-15%. Diuresed 13 lbs, beta blocker stopped with acute decompensation, digoxin added, and Entresto held with low BP.  Admitted 12/22 with A/C CHF after stopping his medications a month prior. Echo showed EF 20-25%. He was diuresed with IV lasix and GDMT restarted.   Out of HF meds at follow up, volume up. Meds restarted and paramedicine arranged.  Today he returns for HF follow up. Overall feeling fine. He  is back on all of his medications and feels good. Some SOB walking further distances. Lives with girlfriend (shirley) and her daughter, who helps with his meds/pillbox. Denies palpitations, abnormal  bleeding, CP, dizziness, edema, or PND/Orthopnea. Appetite ok. No fever or chills. Does not weigh at home. Taking all medications. He is illiterate, uses [employer].   Cardiac Studies: - Echo 12/22: EF 20-25%, global HK, grade II DD, RV moderately reduced, moderate MR - Echo 8/22: EF 10-15%, Repeat echo showed EF 10-15%, severe global hypokinesis with septal akinesis, LV severely dilated, grade II DD, RV fxn moderate to severely reduced, moderate to severe MR, moderate to severe AI.  - Echo 06/2920: EF 20-25%, RV ok  - Echo 01/2019: EF 20-25%  - Echo 06/2018: EF 20-25%  - cMRI 06/27/18: EF 24%. Normal RV   - RHC/LHC 06/24/2018  RA = 1 RV = 19/3 PA = 24/5 (14) PCW = 7 Fick cardiac output/index = 4.0/2.5 PVR = 1.9 WU Ao sat = 95% PA sat = 68%, 71%   Assessment: 1. Severe 1-vessel CAD with occluded LAD within previous stent 2. Normal RCA and LCX 3. LIMA to LAD widely patent 4. SVG - D2 widely patent 5. SVG - D1 likely occluded (grafts not marked)  ROS: All systems negative except as listed in HPI, PMH and Problem List.  Past Medical History:  Diagnosis Date   CHF (congestive heart failure) ([place])    Cirrhosis ([place])  Coronary artery disease    Hypertension    Pancreatitis    Past Surgical History:  Procedure Laterality Date   CARDIAC SURGERY     RIGHT/LEFT HEART CATH AND CORONARY/GRAFT ANGIOGRAPHY N/A 06/24/2018   Procedure: RIGHT/LEFT HEART CATH AND CORONARY/GRAFT ANGIOGRAPHY;  Surgeon: Dolores Patty, MD;  Location: [facility] INVASIVE CV LAB;  Service: Cardiovascular;  Laterality: N/A;   Current Outpatient Medications  Medication Sig Dispense Refill   aspirin EC 81 MG EC tablet Take 1 tablet (81 mg total) by mouth daily. Swallow whole. 30 tablet 11   atorvastatin (LIPITOR) 40 MG  tablet TAKE ONE TABLET BY MOUTH EVERYDAY TABLET 6PM FOR CHOLESTEROL 30 tablet 3   carvedilol (COREG) 3.125 MG tablet TAKE ONE TABLET BY MOUTH TWO TIMES A DAY WITH MEALS 180 tablet 1   digoxin (LANOXIN) 0.125 MG tablet TAKE ONE TABLET BY MOUTH DAILY 30 tablet 6   FARXIGA 10 MG TABS tablet TAKE ONE TABLET BY MOUTH DAILY IN THE MORNING BEFORE BREAKFAST 30 tablet 3   ferrous sulfate 325 (65 FE) MG tablet TAKE ONE TABLET BY MOUTH TWO TIMES A DAY WITH MEALS 180 tablet 7   furosemide (LASIX) 40 MG tablet Take 1 tablet (40 mg total) by mouth daily. 90 tablet 3   potassium chloride SA (KLOR-CON) 20 MEQ tablet Take 1 tablet (20 mEq total) by mouth daily. 90 tablet 3   sacubitril-valsartan (ENTRESTO) 49-51 MG Take 1 tablet by mouth 2 (two) times daily. 60 tablet 11   sertraline (ZOLOFT) 25 MG tablet Take 1 tablet (25 mg total) by mouth daily. 30 tablet 5   spironolactone (ALDACTONE) 25 MG tablet TAKE ONE TABLET BY MOUTH AT BEDTIME 30 tablet 10   No current facility-administered medications for this encounter.   Allergies:   Patient has no known allergies.   Social History:  The patient  reports that he has quit smoking. His smoking use included cigarettes. He smoked an average of .5 packs per day. He has never used smokeless tobacco. He reports current alcohol use. He reports current drug use. Drug: Marijuana.   Family History:  The patient's family history is not on file.   ROS:  Please see the history of present illness.   All other systems are personally reviewed and negative.   Recent Labs: 05/12/2021: B Natriuretic Peptide >4,500.0 06/02/2021: ALT 11; BUN 11; Creatinine, Ser 0.74; Hemoglobin 12.1; Magnesium 2.0; Platelets 201.0; Potassium 3.9; Pro B Natriuretic peptide (BNP) 2,394.0; Sodium 139  Personally reviewed   BP (!) 152/80    Pulse 76    Wt 60.8 kg (134 lb)    SpO2 95%    BMI 23.00 kg/m   Wt Readings from Last 3 Encounters:  06/14/21 60.8 kg (134 lb)  06/02/21 58 kg (127 lb 12.8 oz)   05/17/21 60.2 kg (132 lb 12.8 oz)   Physical Exam: General:  NAD. No resp difficulty, walked into clinic, thin HEENT: Normal Neck: Supple. No JVD. Carotids 2+ bilat; no bruits. No lymphadenopathy or thryomegaly appreciated. Cor: PMI nondisplaced. Regular rate & rhythm. No rubs, gallops or murmurs. Lungs: Clear Abdomen: Soft, nontender, nondistended. No hepatosplenomegaly. No bruits or masses. Good bowel sounds. Extremities: No cyanosis, clubbing, rash, edema Neuro: Alert & oriented x 3, cranial nerves grossly intact. Moves all 4 extremities w/o difficulty. Affect pleasant.  ReDs: 36%  ASSESSMENT AND PLAN: 1.  Chronic biventricular heart failure, NICM - Echo 06/2018 EF 25-30%,  cMRI 06/27/2018 with EF 24%, Echo 01/2019 EF 20-25% - Echo 07/15/20  EF 20-25% RV ok - Echo 8/22 with EF 10-15%, moderately to severely reduced RV function, moderate to severe MR, moderate to severe AI. - Echo (12/22): EF 20-25%, global HK, grade II DD, RV moderately reduced, moderate MR - Previously referred to EP for consideration of CRT-D with wide LBBB but didn't follow up. Likely too advanced  - Improved NYHA II, volume looks good today, weight down 7 lbs by our scale. ReDs 36%. - Increase Entresto to 97/103 mg bid. - Continue Lasix 40 mg daily for now. May be able to pull back next time. - Continue digoxin 0.125 mg daily. Dig level <0.2 05/06/21. - Continue carvedilol 3.125 mg bid. - Continue Farxiga 10 mg daily. - Continue spiro 25 mg daily. - He is not a candidate for advanced therapies. - Paramedicine has attempted to make a home visit,but seems to be managing OK w/ meds with his girlfriend's daughter's help. - Labs at PCP reviewed 06/02/21 and look ok, SCr 0.74, K 3.9, bnp 2394 - BMET and dig level in 10-14 days.   2. CAD - S/P CABG 2017, LHC 06/2018  with occluded LAD normal LCX and RCA. LIMA to LAD and SVG to D2 patent (SVG to D1 occluded) - No s/s angina.  - Continue aspirin 81 mg daily +  atorvastatin.      3. LBBB - Previously referred to EP for consideration of CRT-D with wide LBBB but didn't follow up. Likely too advanced currently to benefit.   4. Valvular heart disease - Moderate MR and mild AI on echo 12/22. LVIDd 7.1 cm   5.  Former Tobacco Use - No longer smoking.  - Lives with people who do smoke.   6. HTN - Elevated today. - Increase Entresto as above.   7. Anemia - Iron deficient. On oral Fe. - Followed by PCP.  8. Illiterate/SDOH - Cannot read. Able to write numbers. Needs assistance with medications. Girlfriend's daughter helps   - Appreciate Paramedicine's assistance.  - Uses [facility]  Follow up in 2 months with APP and 4 months with Dr. Haroldine Laws  Signed, Rafael Bihari, [place]  06/14/2021 10:18 AM  [facility] [address] [facility] [place] [address] [phone] (office) [phone] (fax)

## 2021-06-14 NOTE — Patient Instructions (Signed)
INCREASE Entresto to 97/103 mg, one tab twice a day  (IF YOU HAVE A LOT OF 49/51 MG TABLETS AT HOME, TAKE TWO TABLETS TWICE A DAY UNTIL BOTTLE IS EMPTY THEN START ON THE 97/103 MG BOTTLE AND FOLLOW INSTRUCTIONS ON THE BOTTLE)    Labs needed in 10-14 days  Your physician recommends that you schedule a follow-up appointment in: 2 months  in the Advanced Practitioners (PA/NP) Clinic and in 4 months with Dr Haroldine Laws  Do the following things EVERYDAY: Weigh yourself in the morning before breakfast. Write it down and keep it in a log. Take your medicines as prescribed Eat low salt foods--Limit salt (sodium) to 2000 mg per day.  Stay as active as you can everyday Limit all fluids for the day to less than 2 liters

## 2021-06-14 NOTE — Progress Notes (Signed)
ReDS Vest / Clip - 06/14/21 1000       ReDS Vest / Clip   Station Marker A    Ruler Value 27    ReDS Value Range Moderate volume overload    ReDS Actual Value 36

## 2021-06-19 ENCOUNTER — Other Ambulatory Visit (HOSPITAL_COMMUNITY): Payer: Self-pay

## 2021-06-19 NOTE — Progress Notes (Signed)
Paramedicine Encounter    Patient ID: Jeremy Powers, male    DOB: 1956-04-28, 66 y.o.   MRN: [id number]  Arrived for home visit for Jeremy Powers who reports feeling good today. He denied any chest pain, shortness of breath, dizziness. He reports taking his medications. He was seen in clinic last week.   I obtained assessment and vitals. Lungs clear, no swelling noted.   I reviewed meds and confirmed same. I filled two pill boxes for him with recent med changes to reflect.   We reviewed upcoming appointments and confirmed same. I wrote these down and placed on refrigerator.   Home visit complete. I will see Jeremy Powers in two weeks.    Patient Care Team: Saguier, Kateri Mc as PCP - General (Internal Medicine) [name] Bevelyn Buckles, MD as PCP - Cardiology (Cardiology)  Patient Active Problem List   Diagnosis Date Noted   CHF (congestive heart failure) (HCC) 05/08/2021   Acute on chronic systolic CHF (congestive heart failure) (HCC) 05/06/2021   Mitral regurgitation 12/26/2020   COVID-19 virus infection 12/24/2020   CHF exacerbation (HCC) 12/23/2020   Acute on chronic systolic HF (heart failure) (HCC) 06/24/2018   Aortic insufficiency 06/22/2018   Acute on chronic congestive heart failure (HCC) 06/22/2018   Elevated troponin 06/22/2018   CAD (coronary artery disease) 06/22/2018   Left bundle branch block 06/22/2018   History of iron deficiency anemia 06/22/2018   Acute on chronic heart failure (HCC) 06/22/2018   Chronic bilateral low back pain without sciatica 12/12/2015   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   Iron deficiency anemia 03/28/2015   Hyperlipidemia 10/12/2014    Current Outpatient Medications:    aspirin EC 81 MG EC tablet, Take 1 tablet (81 mg total) by mouth daily. Swallow whole., Disp: 30 tablet, Rfl: 11   atorvastatin (LIPITOR) 40 MG tablet, TAKE ONE TABLET BY MOUTH EVERYDAY TABLET 6PM FOR CHOLESTEROL, Disp: 30 tablet, Rfl: 3   carvedilol (COREG) 3.125 MG  tablet, TAKE ONE TABLET BY MOUTH TWO TIMES A DAY WITH MEALS, Disp: 180 tablet, Rfl: 1   digoxin (LANOXIN) 0.125 MG tablet, TAKE ONE TABLET BY MOUTH DAILY, Disp: 30 tablet, Rfl: 6   FARXIGA 10 MG TABS tablet, TAKE ONE TABLET BY MOUTH DAILY IN THE MORNING BEFORE BREAKFAST, Disp: 30 tablet, Rfl: 3   ferrous sulfate 325 (65 FE) MG tablet, TAKE ONE TABLET BY MOUTH TWO TIMES A DAY WITH MEALS, Disp: 180 tablet, Rfl: 7   furosemide (LASIX) 40 MG tablet, Take 1 tablet (40 mg total) by mouth daily., Disp: 90 tablet, Rfl: 3   potassium chloride SA (KLOR-CON) 20 MEQ tablet, Take 1 tablet (20 mEq total) by mouth daily., Disp: 90 tablet, Rfl: 3   sacubitril-valsartan (ENTRESTO) 97-103 MG, Take 1 tablet by mouth 2 (two) times daily., Disp: 60 tablet, Rfl: 11   sertraline (ZOLOFT) 25 MG tablet, Take 1 tablet (25 mg total) by mouth daily., Disp: 30 tablet, Rfl: 5   spironolactone (ALDACTONE) 25 MG tablet, TAKE ONE TABLET BY MOUTH AT BEDTIME, Disp: 30 tablet, Rfl: 10 No Known Allergies   Social History   Socioeconomic History   Marital status: Single    Spouse name: Not on file   Number of children: Not on file   Years of education: Not on file   Highest education level: Not on file  Occupational History   Not on file  Tobacco Use   Smoking status: Former    Packs/day: 0.50    Types: Cigarettes  Smokeless tobacco: Never  Vaping Use   Vaping Use: Never used  Substance and Sexual Activity   Alcohol use: Yes    Comment: daily   Drug use: Yes    Types: Marijuana   Sexual activity: Not on file  Other Topics Concern   Not on file  Social History Narrative   Not on file   Social Determinants of Health   Financial Resource Strain: Not on file  Food Insecurity: Not on file  Transportation Needs: No Transportation Needs   Lack of Transportation (Medical): No   Lack of Transportation (Non-Medical): No  Physical Activity: Not on file  Stress: Not on file  Social Connections: Not on file   Intimate Partner Violence: Not on file    Physical Exam Vitals reviewed.  Constitutional:      Appearance: Normal appearance. He is normal weight.  HENT:     Head: Normocephalic.     Nose: Nose normal.     Mouth/Throat:     Mouth: Mucous membranes are moist.     Pharynx: Oropharynx is clear.  Eyes:     Conjunctiva/sclera: Conjunctivae normal.     Pupils: Pupils are equal, round, and reactive to light.  Cardiovascular:     Rate and Rhythm: Normal rate and regular rhythm.     Pulses: Normal pulses.     Heart sounds: Normal heart sounds.  Pulmonary:     Effort: Pulmonary effort is normal.     Breath sounds: Normal breath sounds.  Abdominal:     General: Abdomen is flat.     Palpations: Abdomen is soft.  Musculoskeletal:        General: No swelling. Normal range of motion.     Cervical back: Normal range of motion.     Right lower leg: No edema.     Left lower leg: No edema.  Skin:    General: Skin is warm and dry.     Capillary Refill: Capillary refill takes less than 2 seconds.  Neurological:     General: No focal deficit present.     Mental Status: He is alert. Mental status is at baseline.  Psychiatric:        Mood and Affect: Mood normal.        Future Appointments  Date Time Provider Department Center  06/28/2021  9:00 AM [facility] LAB [facility] None  08/14/2021  9:00 AM [facility] PA/NP [facility] None  10/12/2021 10:40 AM Bensimhon, Bevelyn Buckles, MD [facility] None     ACTION: Home visit completed

## 2021-06-22 ENCOUNTER — Telehealth: Payer: Self-pay | Admitting: Medical

## 2021-06-22 NOTE — Telephone Encounter (Signed)
Left message for patient to call back and schedule Medicare Annual Wellness Visit (AWV) in office.  ° °If not able to come in office, please offer to do virtually or by telephone.  Left office number and my jabber #[phone]. ° °Due for AWVI ° °Please schedule at anytime with Nurse Health Advisor. °  °

## 2021-06-28 ENCOUNTER — Other Ambulatory Visit (HOSPITAL_COMMUNITY): Payer: Medicare Other

## 2021-07-03 ENCOUNTER — Telehealth (HOSPITAL_COMMUNITY): Payer: Self-pay

## 2021-07-03 NOTE — Telephone Encounter (Signed)
I attempted to call Jeremy Powers for home paramedicine visit today with no success.   I called his phone and his girlfriends phone leaving messages on both. I will continue to attempt to reach him this week.

## 2021-07-05 ENCOUNTER — Telehealth: Payer: Self-pay | Admitting: Medical

## 2021-07-05 NOTE — Telephone Encounter (Signed)
Jeremy Powers contacted the office from [facility] regarding pt. [phone]  HH would like to know if the pt can be prescribed a blood pressure machine cuff to monitor his readings daily.   Pt will be discharged from [facility] today 2/15. Pt has been compliment with meds and this will be with his daughter assistance.

## 2021-07-06 NOTE — Telephone Encounter (Signed)
Faxed script

## 2021-07-14 ENCOUNTER — Other Ambulatory Visit: Payer: Self-pay | Admitting: Cardiology

## 2021-07-17 ENCOUNTER — Telehealth (HOSPITAL_COMMUNITY): Payer: Self-pay

## 2021-07-17 NOTE — Telephone Encounter (Signed)
Left message in attempt to reach Jeremy Powers for home paramedicine visit. Unsuccessful. I will continue to reach out.

## 2021-07-20 ENCOUNTER — Telehealth: Payer: Self-pay | Admitting: Medical

## 2021-07-20 NOTE — Telephone Encounter (Signed)
Danielle called from [facility] regarding forms faxed over for pt  ? ?Forms were faxed on 1/10 & multiple times after regarding pt. HH would like forms faxed back asap  ? ?Please advise  ?

## 2021-07-20 NOTE — Telephone Encounter (Signed)
Spoke with Duwayne Heck , made her aware we never received forms for 05/30/21 but we did have forms from order date 06/13/21 , she stated that was a different order form ... danielle is faxing forms today ?

## 2021-07-20 NOTE — Telephone Encounter (Signed)
Opened in error

## 2021-07-27 ENCOUNTER — Telehealth (HOSPITAL_COMMUNITY): Payer: Self-pay

## 2021-07-27 ENCOUNTER — Telehealth: Payer: Self-pay | Admitting: Medical

## 2021-07-27 NOTE — Telephone Encounter (Signed)
Danielle called for update on forms that were faxed again on 3/2 ? ?Please advise  ?

## 2021-07-27 NOTE — Telephone Encounter (Signed)
Attempted to reach Jeremy Powers with no success. I will attempt to follow up upon my return on 3/20. ?

## 2021-07-27 NOTE — Telephone Encounter (Signed)
Left message for patient to call back and schedule Medicare Annual Wellness Visit (AWV) in office.  ° °If not able to come in office, please offer to do virtually or by telephone.  Left office number and my jabber #[phone]. ° °Due for AWVI ° °Please schedule at anytime with Nurse Health Advisor. °  °

## 2021-07-28 NOTE — Telephone Encounter (Signed)
Spoke with danielle , stated she is resending forms over ?

## 2021-07-28 NOTE — Telephone Encounter (Signed)
Orders received and faxed back to [facility] ?

## 2021-08-10 ENCOUNTER — Telehealth (HOSPITAL_COMMUNITY): Payer: Self-pay

## 2021-08-10 NOTE — Telephone Encounter (Signed)
Left message for Jeremy Powers to return my call to set up home paramedicine visit. I will continue to attempt and make HF clinic aware of multiple attempts to schedule.  ?

## 2021-08-14 ENCOUNTER — Encounter (HOSPITAL_COMMUNITY): Payer: Medicare Other

## 2021-08-14 NOTE — Progress Notes (Incomplete)
? ?  ? ?Advanced Heart Failure Clinic Note ? ?Date:  08/14/2021  ? ?PCP:  [name], PA-C  ?Cardiologist:  Arvilla Meres, MD ?Primary HF: Dr. [name] ? ?Chief Complaint: HF Follow up ?  ?History of Present Illness: ?Jeremy Powers is a 66 y.o.male CAD s/p PCI to LAD in 2015 and 2016, s/p CABG 2017, systolic HF EF 20-25%, microcytic/iron deficiency anemia followed by hematology and GI, LBBB, and tobacco use. Illiterate and cannot read.  ? ?In 2/20 he presented to [facility] ED with SOB, cough, and orthopnea in setting of med non-compliance. Was unable to afford copays with Medicaid. He was tachycardic and tachypneic on arrival, and was transferred to [facility] 06/22/18 for further evaluation. Echo showed newly decreased EF of 25-30% from 50-55% in 12/2017.  Underwent R/LHC showing severe 1 v disease with occluded LAD within previous stent. RCA and LCX normal. LIMA to LAD and SVG to D2 patent. Cath also showed low filling pressures with moderately low cardiac output. Meds adjusted as tolerated.  ? ?OV 04/05/20 returns for HF follow up after not being seen in > 1 year. Lives with his girlfriend. Followed by HF Paramedicine. Lasix was stopped & [name] was started. ? ?Echo 2/22 LVEF 20-25%, RV ok.   ? ?3/22 he was discharged from community paramedicine program and bubble pill packs arranged.  ? ?Seen in the ED 10/25/20 for CHF exacerbation in the setting of medication non-compliance. He was diuresed with IV lasix and discharged home. Sent to the ED 12/23/20 by his PCP and found to be A/C CHF and COVID +. Repeat echo EF 10-15%. Diuresed 13 lbs, beta blocker stopped with acute decompensation, digoxin added, and Entresto held with low BP. ? ?Admitted 12/22 with A/C CHF after stopping his medications a month prior. Echo showed EF 20-25%. He was diuresed with IV lasix and GDMT restarted.  ? ?Out of HF meds at follow up, volume up. Meds restarted and paramedicine arranged. ? ?Today he returns for HF follow up. Overall feeling fine. He  is back on all of his medications and feels good. Some SOB walking further distances. Lives with girlfriend (shirley) and her daughter, who helps with his meds/pillbox. Denies palpitations, abnormal  bleeding, CP, dizziness, edema, or PND/Orthopnea. Appetite ok. No fever or chills. Does not weigh at home. Taking all medications. He is illiterate, uses [employer].  ? ?Cardiac Studies: ?- Echo 12/22: EF 20-25%, global HK, grade II DD, RV moderately reduced, moderate MR ?- Echo 8/22: EF 10-15%, Repeat echo showed EF 10-15%, severe global hypokinesis with septal akinesis, LV severely dilated, grade II DD, RV fxn moderate to severely reduced, moderate to severe MR, moderate to severe AI.  ?- Echo 06/2920: EF 20-25%, RV ok  ?- Echo 01/2019: EF 20-25%  ?- Echo 06/2018: EF 20-25%  ?- cMRI 06/27/18: EF 24%. Normal RV ?  ?- RHC/LHC 06/24/2018  ?RA = 1 ?RV = 19/3 ?PA = 24/5 (14) ?PCW = 7 ?Fick cardiac output/index = 4.0/2.5 ?PVR = 1.9 WU ?Ao sat = 95% ?PA sat = 68%, 71%  ? ?Assessment: ?1. Severe 1-vessel CAD with occluded LAD within previous stent ?2. Normal RCA and LCX ?3. LIMA to LAD widely patent ?4. SVG - D2 widely patent ?5. SVG - D1 likely occluded (grafts not marked) ? ?ROS: All systems negative except as listed in HPI, PMH and Problem List. ? ?Past Medical History:  ?Diagnosis Date  ? CHF (congestive heart failure) (HCC)   ? Cirrhosis (HCC)   ?  Coronary artery disease   ? Hypertension   ? Pancreatitis   ? ?Past Surgical History:  ?Procedure Laterality Date  ? CARDIAC SURGERY    ? RIGHT/LEFT HEART CATH AND CORONARY/GRAFT ANGIOGRAPHY N/A 06/24/2018  ? Procedure: RIGHT/LEFT HEART CATH AND CORONARY/GRAFT ANGIOGRAPHY;  Surgeon: Dolores Patty, MD;  Location: [facility] INVASIVE CV LAB;  Service: Cardiovascular;  Laterality: N/A;  ? ?Current Outpatient Medications  ?Medication Sig Dispense Refill  ? spironolactone (ALDACTONE) 25 MG tablet TAKE ONE TABLET BY MOUTH AT BEDTIME 30 tablet 9  ? aspirin EC 81 MG EC tablet Take 1 tablet  (81 mg total) by mouth daily. Swallow whole. 30 tablet 11  ? atorvastatin (LIPITOR) 40 MG tablet TAKE ONE TABLET BY MOUTH EVERYDAY TABLET 6PM FOR CHOLESTEROL 30 tablet 3  ? carvedilol (COREG) 3.125 MG tablet TAKE ONE TABLET BY MOUTH TWO TIMES A DAY WITH MEALS 180 tablet 1  ? digoxin (LANOXIN) 0.125 MG tablet TAKE ONE TABLET BY MOUTH DAILY 30 tablet 6  ? FARXIGA 10 MG TABS tablet TAKE ONE TABLET BY MOUTH DAILY IN THE MORNING BEFORE BREAKFAST 30 tablet 3  ? ferrous sulfate 325 (65 FE) MG tablet TAKE ONE TABLET BY MOUTH TWO TIMES A DAY WITH MEALS 180 tablet 7  ? furosemide (LASIX) 40 MG tablet Take 1 tablet (40 mg total) by mouth daily. 90 tablet 3  ? potassium chloride SA (KLOR-CON) 20 MEQ tablet Take 1 tablet (20 mEq total) by mouth daily. 90 tablet 3  ? sacubitril-valsartan (ENTRESTO) 97-103 MG Take 1 tablet by mouth 2 (two) times daily. 60 tablet 11  ? sertraline (ZOLOFT) 25 MG tablet Take 1 tablet (25 mg total) by mouth daily. 30 tablet 5  ? ?No current facility-administered medications for this visit.  ? ?Allergies:   Patient has no known allergies.  ? ?Social History:  The patient  reports that he has quit smoking. His smoking use included cigarettes. He smoked an average of .5 packs per day. He has never used smokeless tobacco. He reports current alcohol use. He reports current drug use. Drug: Marijuana.  ? ?Family History:  The patient's family history is not on file.  ? ?ROS:  Please see the history of present illness.   All other systems are personally reviewed and negative.  ? ?Recent Labs: ?05/12/2021: B Natriuretic Peptide >4,500.0 ?06/02/2021: ALT 11; BUN 11; Creatinine, Ser 0.74; Hemoglobin 12.1; Magnesium 2.0; Platelets 201.0; Potassium 3.9; Pro B Natriuretic peptide (BNP) 2,394.0; Sodium 139  ?Personally reviewed  ? ?There were no vitals taken for this visit. ? ?Wt Readings from Last 3 Encounters:  ?06/19/21 60.3 kg (133 lb)  ?06/14/21 60.8 kg (134 lb)  ?06/02/21 58 kg (127 lb 12.8 oz)  ? ?Physical  Exam: ?General:  NAD. No resp difficulty, walked into clinic, thin ?HEENT: Normal ?Neck: Supple. No JVD. Carotids 2+ bilat; no bruits. No lymphadenopathy or thryomegaly appreciated. ?Cor: PMI nondisplaced. Regular rate & rhythm. No rubs, gallops or murmurs. ?Lungs: Clear ?Abdomen: Soft, nontender, nondistended. No hepatosplenomegaly. No bruits or masses. Good bowel sounds. ?Extremities: No cyanosis, clubbing, rash, edema ?Neuro: Alert & oriented x 3, cranial nerves grossly intact. Moves all 4 extremities w/o difficulty. Affect pleasant. ? ?ReDs: 36% ? ?ASSESSMENT AND PLAN: ?1.  Chronic biventricular heart failure, NICM ?- Echo 06/2018 EF 25-30%,  cMRI 06/27/2018 with EF 24%, Echo 01/2019 EF 20-25% ?- Echo 07/15/20 EF 20-25% RV ok ?- Echo 8/22 with EF 10-15%, moderately to severely reduced RV function, moderate to severe MR, moderate to  severe AI. ?- Echo (12/22): EF 20-25%, global HK, grade II DD, RV moderately reduced, moderate MR ?- Previously referred to EP for consideration of CRT-D with wide LBBB but didn't follow up. Likely too advanced  ?- Improved NYHA II, volume looks good today, weight down 7 lbs by our scale. ReDs 36%. ?- Increase Entresto to 97/103 mg bid. ?- Continue Lasix 40 mg daily for now. May be able to pull back next time. ?- Continue digoxin 0.125 mg daily. Dig level <0.2 05/06/21. ?- Continue carvedilol 3.125 mg bid. ?- Continue Farxiga 10 mg daily. ?- Continue spiro 25 mg daily. ?- He is not a candidate for advanced therapies. ?- Paramedicine has attempted to make a home visit,but seems to be managing OK w/ meds with his girlfriend's daughter's help. ?- Labs at PCP reviewed 06/02/21 and look ok, SCr 0.74, K 3.9, bnp 2394 ?- BMET and dig level in 10-14 days. ?  ?2. CAD ?- S/P CABG 2017, LHC 06/2018  with occluded LAD normal LCX and RCA. LIMA to LAD and SVG to D2 patent (SVG to D1 occluded) ?- No s/s angina.  ?- Continue aspirin 81 mg daily + atorvastatin.    ?  ?3. LBBB ?- Previously referred to EP for  consideration of CRT-D with wide LBBB but didn't follow up. Likely too advanced currently to benefit. ?  ?4. Valvular heart disease ?- Moderate MR and mild AI on echo 12/22. LVIDd 7.1 cm ?  ?5.  Former Tob

## 2021-08-23 ENCOUNTER — Telehealth (HOSPITAL_COMMUNITY): Payer: Self-pay

## 2021-08-23 NOTE — Telephone Encounter (Signed)
Left message for Jeremy Powers to reach out to schedule home visit on 4/3 and 4/5. No return of call over last several weeks.  I will have LCSW made aware.  ?

## 2021-08-24 ENCOUNTER — Telehealth (HOSPITAL_COMMUNITY): Payer: Self-pay | Admitting: Licensed Clinical Social Worker

## 2021-08-24 NOTE — Telephone Encounter (Signed)
CSW attempted to call pt to confirm if he still wanted to be with paramedicine program.  Unable to reach on home phone but unable to reach- left VM.  Spoke with pt dtr (girlfriends dtr) who reports she will pass along message to call us back. ? ?Burna Sis, LCSW ?Clinical Social Worker ?[facility] Failure Clinic ?Desk#: [phone] ?Cell#: [phone] ? ?

## 2021-09-04 ENCOUNTER — Telehealth (HOSPITAL_COMMUNITY): Payer: Self-pay | Admitting: Licensed Clinical Social Worker

## 2021-09-04 NOTE — Telephone Encounter (Signed)
CSW attempted to call pt again to assess continued interest in being part of [employer] program.  Unable to reach- left VM informing he will be removed from list but could be re-evaluated for enrollment if he reached back out. ? ?Pt being discharged from Paramedicine program at this time for lack of communication. ? ?Burna Sis, LCSW ?Clinical Social Worker ?Advanced [facility] Failure Clinic ?Desk#: [phone] ?Cell#: [phone] ? ?

## 2021-09-13 ENCOUNTER — Telehealth (HOSPITAL_COMMUNITY): Payer: Self-pay | Admitting: Licensed Clinical Social Worker

## 2021-09-13 NOTE — Telephone Encounter (Signed)
Entered in error

## 2021-09-20 ENCOUNTER — Emergency Department (HOSPITAL_COMMUNITY): Payer: Medicare Other

## 2021-09-20 ENCOUNTER — Encounter (HOSPITAL_COMMUNITY): Payer: Self-pay

## 2021-09-20 ENCOUNTER — Inpatient Hospital Stay (HOSPITAL_COMMUNITY)
Admission: EM | Admit: 2021-09-20 | Discharge: 2021-09-24 | DRG: 291 | Disposition: A | Discharge status: Home Health | Payer: Medicare Other | Attending: Internal Medicine | Admitting: Internal Medicine

## 2021-09-20 ENCOUNTER — Telehealth: Payer: Self-pay | Admitting: *Deleted

## 2021-09-20 DIAGNOSIS — R062 Wheezing: Secondary | ICD-10-CM | POA: Diagnosis not present

## 2021-09-20 DIAGNOSIS — I5043 Acute on chronic combined systolic (congestive) and diastolic (congestive) heart failure: Secondary | ICD-10-CM | POA: Diagnosis not present

## 2021-09-20 DIAGNOSIS — E876 Hypokalemia: Secondary | ICD-10-CM | POA: Diagnosis present

## 2021-09-20 DIAGNOSIS — Z91148 Patient's other noncompliance with medication regimen for other reason: Secondary | ICD-10-CM

## 2021-09-20 DIAGNOSIS — I340 Nonrheumatic mitral (valve) insufficiency: Secondary | ICD-10-CM | POA: Diagnosis present

## 2021-09-20 DIAGNOSIS — I10 Essential (primary) hypertension: Secondary | ICD-10-CM | POA: Diagnosis present

## 2021-09-20 DIAGNOSIS — Z91199 Patient's noncompliance with other medical treatment and regimen due to unspecified reason: Secondary | ICD-10-CM

## 2021-09-20 DIAGNOSIS — I509 Heart failure, unspecified: Secondary | ICD-10-CM

## 2021-09-20 DIAGNOSIS — I444 Left anterior fascicular block: Secondary | ICD-10-CM | POA: Diagnosis present

## 2021-09-20 DIAGNOSIS — Z79899 Other long term (current) drug therapy: Secondary | ICD-10-CM

## 2021-09-20 DIAGNOSIS — Z550 Illiteracy and low-level literacy: Secondary | ICD-10-CM

## 2021-09-20 DIAGNOSIS — I2510 Atherosclerotic heart disease of native coronary artery without angina pectoris: Secondary | ICD-10-CM | POA: Diagnosis not present

## 2021-09-20 DIAGNOSIS — R778 Other specified abnormalities of plasma proteins: Secondary | ICD-10-CM

## 2021-09-20 DIAGNOSIS — Z7982 Long term (current) use of aspirin: Secondary | ICD-10-CM

## 2021-09-20 DIAGNOSIS — Z7984 Long term (current) use of oral hypoglycemic drugs: Secondary | ICD-10-CM

## 2021-09-20 DIAGNOSIS — Z6821 Body mass index (BMI) 21.0-21.9, adult: Secondary | ICD-10-CM

## 2021-09-20 DIAGNOSIS — Z951 Presence of aortocoronary bypass graft: Secondary | ICD-10-CM

## 2021-09-20 DIAGNOSIS — I110 Hypertensive heart disease with heart failure: Secondary | ICD-10-CM | POA: Diagnosis not present

## 2021-09-20 DIAGNOSIS — K7460 Unspecified cirrhosis of liver: Secondary | ICD-10-CM | POA: Diagnosis present

## 2021-09-20 DIAGNOSIS — Z9861 Coronary angioplasty status: Secondary | ICD-10-CM

## 2021-09-20 DIAGNOSIS — N179 Acute kidney failure, unspecified: Secondary | ICD-10-CM | POA: Diagnosis present

## 2021-09-20 DIAGNOSIS — Z87891 Personal history of nicotine dependence: Secondary | ICD-10-CM

## 2021-09-20 DIAGNOSIS — D649 Anemia, unspecified: Secondary | ICD-10-CM | POA: Diagnosis present

## 2021-09-20 DIAGNOSIS — E440 Moderate protein-calorie malnutrition: Secondary | ICD-10-CM | POA: Diagnosis present

## 2021-09-20 DIAGNOSIS — E785 Hyperlipidemia, unspecified: Secondary | ICD-10-CM | POA: Diagnosis present

## 2021-09-20 LAB — CBC WITH DIFFERENTIAL/PLATELET
Abs Immature Granulocytes: 0.01 10*3/uL (ref 0.00–0.07)
Basophils Absolute: 0 10*3/uL (ref 0.0–0.1)
Basophils Relative: 1 %
Eosinophils Absolute: 0 10*3/uL (ref 0.0–0.5)
Eosinophils Relative: 0 %
HCT: 36.4 % — ABNORMAL LOW (ref 39.0–52.0)
Hemoglobin: 12.2 g/dL — ABNORMAL LOW (ref 13.0–17.0)
Immature Granulocytes: 0 %
Lymphocytes Relative: 17 %
Lymphs Abs: 0.9 10*3/uL (ref 0.7–4.0)
MCH: 31.4 pg (ref 26.0–34.0)
MCHC: 33.5 g/dL (ref 30.0–36.0)
MCV: 93.8 fL (ref 80.0–100.0)
Monocytes Absolute: 0.6 10*3/uL (ref 0.1–1.0)
Monocytes Relative: 13 %
Neutro Abs: 3.5 10*3/uL (ref 1.7–7.7)
Neutrophils Relative %: 69 %
Platelets: 188 10*3/uL (ref 150–400)
RBC: 3.88 MIL/uL — ABNORMAL LOW (ref 4.22–5.81)
RDW: 16.6 % — ABNORMAL HIGH (ref 11.5–15.5)
WBC: 5 10*3/uL (ref 4.0–10.5)
nRBC: 0 % (ref 0.0–0.2)

## 2021-09-20 LAB — BASIC METABOLIC PANEL
Anion gap: 12 (ref 5–15)
BUN: 6 mg/dL — ABNORMAL LOW (ref 8–23)
CO2: 23 mmol/L (ref 22–32)
Calcium: 8.5 mg/dL — ABNORMAL LOW (ref 8.9–10.3)
Chloride: 104 mmol/L (ref 98–111)
Creatinine, Ser: 0.96 mg/dL (ref 0.61–1.24)
GFR, Estimated: >60 mL/min (ref >60)
Glucose, Bld: 84 mg/dL (ref 70–99)
Potassium: 3.5 mmol/L (ref 3.5–5.1)
Sodium: 139 mmol/L (ref 135–145)

## 2021-09-20 LAB — TROPONIN I (HIGH SENSITIVITY)
Troponin I (High Sensitivity): 31 ng/L — ABNORMAL HIGH (ref <18)
Troponin I (High Sensitivity): 32 ng/L — ABNORMAL HIGH (ref <18)

## 2021-09-20 LAB — BRAIN NATRIURETIC PEPTIDE: B Natriuretic Peptide: >4500 pg/mL — ABNORMAL HIGH (ref 0.0–100.0)

## 2021-09-20 LAB — DIGOXIN LEVEL: Digoxin Level: <0.2 ng/mL — ABNORMAL LOW (ref 0.8–2.0)

## 2021-09-20 MED ORDER — FUROSEMIDE 10 MG/ML IJ SOLN
40.0000 mg | Freq: Once | INTRAMUSCULAR | Status: AC
  Administered 2021-09-20: 40 mg via INTRAVENOUS
  Filled 2021-09-20: qty 4

## 2021-09-20 MED ORDER — FUROSEMIDE 10 MG/ML IJ SOLN
40.0000 mg | Freq: Every day | INTRAMUSCULAR | Status: DC
  Administered 2021-09-21: 40 mg via INTRAVENOUS
  Filled 2021-09-20: qty 4

## 2021-09-20 MED ORDER — ENOXAPARIN SODIUM 40 MG/0.4ML IJ SOSY
40.0000 mg | PREFILLED_SYRINGE | INTRAMUSCULAR | Status: DC
  Administered 2021-09-21 – 2021-09-23 (×4): 40 mg via SUBCUTANEOUS
  Filled 2021-09-20 (×4): qty 0.4

## 2021-09-20 MED ORDER — ALBUTEROL SULFATE HFA 108 (90 BASE) MCG/ACT IN AERS
2.0000 | INHALATION_SPRAY | Freq: Once | RESPIRATORY_TRACT | Status: AC
  Administered 2021-09-20: 2 via RESPIRATORY_TRACT
  Filled 2021-09-20: qty 6.7

## 2021-09-20 NOTE — Chronic Care Management (AMB) (Signed)
  Care Management   Outreach Note  09/20/2021 Name: Jeremy Powers MRN: [id number] DOB: April 16, 1956  Referred by: Mackie Pai, PA-C Reason for referral : Care Coordination (Outreach to schedule initial with RNCM (HR list) )   An unsuccessful telephone outreach was attempted today. The patient was referred to the case management team for assistance with care management and care coordination.   Follow Up Plan:  A HIPAA compliant phone message was left for the patient providing contact information and requesting a return call.   Julian Hy, [facility]  Direct Dial: [phone]

## 2021-09-20 NOTE — ED Provider Triage Note (Signed)
Emergency Medicine Provider Triage Evaluation Note ? ?Jeremy Powers , a 66 y.o. male  was evaluated in triage.  Pt complains of shortness of breath and lower extremity swelling.  Patient states that he has not had his medications over the past few months for his congestive heart failure due to possible transportation issues versus possible issues with community para medicine.  He states that he has had increased shortness of breath over the past few months.  Today he decided to call EMS for evaluation.  Complains of shortness of breath, lower extremity swelling, nausea.  Denies chest pain ? ?Review of Systems  ?Positive: Shortness of breath, dependent edema ?Negative: Chest pain ? ?Physical Exam  ?BP 140/77 (BP Location: Right Arm)   Pulse 91   Temp 98.7 ?F (37.1 ?C) (Oral)   Resp 18   SpO2 98%  ?Gen:   Awake, no distress   ?Resp:  Normal effort  ?MSK:   Moves extremities without difficulty  ?Other:  Pitting edema noted in bilateral lower extremities ? ?Medical Decision Making  ?Medically screening exam initiated at 3:15 PM.  Appropriate orders placed.  Rosana Fret was informed that the remainder of the evaluation will be completed by another provider, this initial triage assessment does not replace that evaluation, and the importance of remaining in the ED until their evaluation is complete. ? ? ?  ?Dorothyann Peng, PA-C ?09/20/21 1517 ? ?

## 2021-09-20 NOTE — ED Provider Notes (Signed)
?[facility] EMERGENCY DEPARTMENT ?Provider Note ? ? ?CSN: [id number] ?Arrival date & time: 09/20/21  1456 ? ?  ?History ? ? ? ?Jeremy Powers is a 66 y.o. male hx of CHF, CAD, med non compliance due to illiteracy here for evaluation of DOE, rest of breath and increasing lower extremity swelling.  Limited he says he is taking his medications and then says he is not sure as there is no one to put them in his pill packs.  Has poor transportation issue and has been able to FU outpatient. Per Epic note seems they have been trying to reach out to patient for FU however has not returned calls.  Has had cough productive of white sputum.  No chest pain, abdominal pain, nausea, vomiting.  He feels generally weak which is at his baseline.  He has increase in his lower extremity swelling.  He thinks he is taking his Lasix.  Admits to some PND, orthopnea, sleeping with 3-4 pillows at night.  Wheeze on exam.  Denies prior history of COPD.  Does admit to chronic EtOH use, 3-4 times weekly.  Denies DTs, withdrawal seizure.  Unsure what his dry weight is. ? ?Patient is unable to read therefor does not know what meds to take at home ? ? ?PCP- [place] ? ? ?HPI ? ?  ?Home Medications ?Prior to Admission medications   ?Medication Sig Start Date End Date Taking? Authorizing Provider  ?spironolactone (ALDACTONE) 25 MG tablet TAKE ONE TABLET BY MOUTH AT BEDTIME 07/14/21   Bensimhon, Bevelyn Buckles, MD  ?aspirin EC 81 MG EC tablet Take 1 tablet (81 mg total) by mouth daily. Swallow whole. 05/11/21   Cipriano Bunker, MD  ?atorvastatin (LIPITOR) 40 MG tablet TAKE ONE TABLET BY MOUTH EVERYDAY TABLET 6PM FOR CHOLESTEROL 05/23/21   Bensimhon, Bevelyn Buckles, MD  ?carvedilol (COREG) 3.125 MG tablet TAKE ONE TABLET BY MOUTH TWO TIMES A DAY WITH MEALS 04/18/21   Bensimhon, Bevelyn Buckles, MD  ?digoxin (LANOXIN) 0.125 MG tablet TAKE ONE TABLET BY MOUTH DAILY 05/18/21   Bensimhon, Bevelyn Buckles, MD  ?FARXIGA 10 MG TABS tablet TAKE ONE TABLET BY MOUTH DAILY IN  THE MORNING BEFORE BREAKFAST 05/23/21   Bensimhon, Bevelyn Buckles, MD  ?ferrous sulfate 325 (65 FE) MG tablet TAKE ONE TABLET BY MOUTH TWO TIMES A DAY WITH MEALS 04/25/21   Bensimhon, Bevelyn Buckles, MD  ?furosemide (LASIX) 40 MG tablet Take 1 tablet (40 mg total) by mouth daily. 03/09/21   Jacklynn Ganong, FNP  ?potassium chloride SA (KLOR-CON) 20 MEQ tablet Take 1 tablet (20 mEq total) by mouth daily. 03/09/21   Jacklynn Ganong, FNP  ?sacubitril-valsartan (ENTRESTO) 97-103 MG Take 1 tablet by mouth 2 (two) times daily. 06/14/21   Jacklynn Ganong, FNP  ?sertraline (ZOLOFT) 25 MG tablet Take 1 tablet (25 mg total) by mouth daily. 12/22/20   Saguier, Ramon Dredge, PA-C  ?   ? ?Allergies    ?Patient has no known allergies.   ? ?Review of Systems   ?Review of Systems  ?Constitutional: Negative.   ?HENT: Negative.    ?Respiratory:  Positive for cough, shortness of breath and wheezing.   ?Cardiovascular:  Positive for leg swelling. Negative for chest pain and palpitations.  ?Gastrointestinal: Negative.   ?Genitourinary: Negative.   ?Musculoskeletal: Negative.   ?Skin: Negative.   ?Neurological:  Positive for weakness (generalized).  ?All other systems reviewed and are negative. ? ?Physical Exam ?Updated Vital Signs ?BP 131/72   Pulse 88  Temp 98.7 ?F (37.1 ?C) (Oral)   Resp 18   SpO2 97%  ?Physical Exam ?Vitals and nursing note reviewed.  ?Constitutional:   ?   General: He is not in acute distress. ?   Appearance: He is well-developed. He is ill-appearing (chronically ill appearing). He is not toxic-appearing or diaphoretic.  ?HENT:  ?   Head: Normocephalic and atraumatic.  ?   Nose: Nose normal.  ?   Mouth/Throat:  ?   Mouth: Mucous membranes are moist.  ?Eyes:  ?   Pupils: Pupils are equal, round, and reactive to light.  ?Cardiovascular:  ?   Rate and Rhythm: Normal rate and regular rhythm.  ?   Pulses:     ?     Radial pulses are 2+ on the right side and 2+ on the left side.  ?     Dorsalis pedis pulses are detected w/ Doppler  on the right side and detected w/ Doppler on the left side.  ?   Heart sounds: Normal heart sounds.  ?   Comments: DP pulses with doppler suspect due to fluid overload ?Pulmonary:  ?   Effort: Pulmonary effort is normal. No respiratory distress.  ?   Breath sounds: Wheezing and rales present.  ?Abdominal:  ?   General: Bowel sounds are normal. There is no distension.  ?   Palpations: Abdomen is soft.  ?   Tenderness: There is no abdominal tenderness. There is no right CVA tenderness, left CVA tenderness, guarding or rebound.  ?Musculoskeletal:     ?   General: Normal range of motion.  ?   Cervical back: Normal range of motion and neck supple.  ?   Comments: 4+ pitting edema to knees, 2+ mid thigh ?Full range of motion BLE  ?Feet:  ?   Right foot:  ?   Toenail Condition: Right toenails are abnormally thick and long.  ?   Left foot:  ?   Toenail Condition: Left toenails are abnormally thick and long.  ?   Comments: Extremely long bilateral toenails curving to plantar aspect Bl great toes ?Skin: ?   General: Skin is warm and dry.  ?   Capillary Refill: Capillary refill takes less than 2 seconds.  ?   Comments: No erythema, warmth, crepitus  ?Neurological:  ?   General: No focal deficit present.  ?   Mental Status: He is alert and oriented to person, place, and time.  ? ?ED Results / Procedures / Treatments   ?Labs ?(all labs ordered are listed, but only abnormal results are displayed) ?Labs Reviewed  ?BASIC METABOLIC PANEL - Abnormal; Notable for the following components:  ?    Result Value  ? BUN 6 (*)   ? Calcium 8.5 (*)   ? All other components within normal limits  ?CBC WITH DIFFERENTIAL/PLATELET - Abnormal; Notable for the following components:  ? RBC 3.88 (*)   ? Hemoglobin 12.2 (*)   ? HCT 36.4 (*)   ? RDW 16.6 (*)   ? All other components within normal limits  ?BRAIN NATRIURETIC PEPTIDE - Abnormal; Notable for the following components:  ? B Natriuretic Peptide >4,500.0 (*)   ? All other components within normal  limits  ?DIGOXIN LEVEL - Abnormal; Notable for the following components:  ? Digoxin Level <0.2 (*)   ? All other components within normal limits  ?TROPONIN I (HIGH SENSITIVITY) - Abnormal; Notable for the following components:  ? Troponin I (High Sensitivity) 31 (*)   ?  All other components within normal limits  ?TROPONIN I (HIGH SENSITIVITY)  ? ? ?EKG ?EKG Interpretation ? ?Date/Time:  Wednesday Sep 20 2021 17:02:49 EDT ?Ventricular Rate:  92 ?PR Interval:  146 ?QRS Duration: 198 ?QT Interval:  508 ?QTC Calculation: 629 ?R Axis:   -62 ?Text Interpretation: Sinus rhythm Left bundle branch block No significant change since last tracing Confirmed by Richardean Canal [id number]) on 09/20/2021 5:07:50 PM ? ?Radiology ?DG Chest 2 View ? ?Result Date: 09/20/2021 ?CLINICAL DATA:  Shortness of breath EXAM: CHEST - 2 VIEW COMPARISON:  Chest x-ray dated June 02, 2021 FINDINGS: Unchanged cardiomegaly. Prior median sternotomy. Mild linear opacities of the left lung which are likely due to atelectasis. Lungs otherwise clear. No pleural effusion or pneumothorax. IMPRESSION: Stable cardiomegaly.  No active cardiopulmonary disease. Electronically Signed   By: Allegra Lai M.D.   On: 09/20/2021 16:41   ? ? ? ?Last echo 12/22  ?>>>>EF 20-25% ? ?Procedures ?Procedures  ? ? ?Medications Ordered in ED ?Medications  ?albuterol (VENTOLIN HFA) 108 (90 Base) MCG/ACT inhaler 2 puff (2 puffs Inhalation Given 09/20/21 1835)  ?furosemide (LASIX) injection 40 mg (40 mg Intravenous Given 09/20/21 1835)  ? ?ED Course/ Medical Decision Making/ A&P ?  ? ?66 year old history of medication noncompliance due to poor literacy here for evaluation of shortness of breath, lower extremity swelling.  Has some PND, orthopnea.  Significantly fluid overloaded on exam.  Afebrile, nonseptic, not ill-appearing.  No fever.  No erythema, warmth.  No risk factors for PE, DVT. Unclear if taking meds at home, States he is in one sentences then says he is unsure what meds he is  taking. ? ?Labs and imaging personally viewed and interpreted: ? ?Trop 31 ?CBC no leukocytosis, hemoglobin 12.2 similar to prior ?BMP significant abnormality ?BNP >4.500 ?DG chest with cardiomegaly without

## 2021-09-20 NOTE — H&P (Signed)
?History and Physical  ? ? ?Patient: Jeremy Powers MBW:[id number] DOB: 04/04/1956 ?DOA: 09/20/2021 ?DOS: the patient was seen and examined on 09/20/2021 ?PCP: [name], PA-C  ?Patient coming from: Home ? ?Chief Complaint: Lower extremity edema ? ?HPI: Jeremy Powers is a 66 y.o. 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 ? ?Patient is a limited historian and has poor insight into his conditions.  He reports increasing lower extremity swelling for at least 4 to 5 months.  Has noticed intermittent shortness of breath and some very mild chest discomfort.  Has intermittent cough.  He has a lady at home that helps with his medicines but says that he has not been taking some of them.  Unclear which ones he stopped taking since he is illiterate. ?He was last admitted back in December for acute on chronic CHF due to medication nonadherence as well.  Last had follow-up with cardiology in January but he has no recollection of this. ? ?In the ED, he was afebrile normotensive on room air.  No leukocytosis, hemoglobin of 12.2.  BMP with stable creatinine 0.96.  BNP of greater than 4500.  Digoxin level was low at 0.2.  Chest x-ray shows cardiomegaly but no pulmonary edema. ? ?EKG with LBBB. ? ?He was given IV 40 mg Lasix and albuterol inhaler in the ED. ?Review of Systems: unable to review all systems due to the inability of the patient to answer questions. ?Past Medical History:  ?Diagnosis Date  ? CHF (congestive heart failure) (HCC)   ? Cirrhosis (HCC)   ? Coronary artery disease   ? Hypertension   ? Pancreatitis   ? ?Past Surgical History:  ?Procedure Laterality Date  ? CARDIAC SURGERY    ? RIGHT/LEFT HEART CATH AND CORONARY/GRAFT ANGIOGRAPHY N/A 06/24/2018  ? Procedure: RIGHT/LEFT HEART CATH AND CORONARY/GRAFT ANGIOGRAPHY;  Surgeon: Dolores Patty, MD;  Location: [facility];  Service: Cardiovascular;  Laterality: N/A;  ? ?Social History:  reports that he has quit smoking. His smoking use included cigarettes. He smoked an average of .5 packs per day. He has never used smokeless tobacco. He reports current alcohol use. He reports current drug use. Drug: Marijuana. ?Denies drug use to me on my evaluation.  ?Lives at home with a lady friend and her daughter ? ?No Known Allergies ? ?Family History  ?Problem Relation Age of Onset  ? Iron deficiency Neg Hx   ? ? ?Prior to Admission medications   ?Medication Sig Start Date End Date Taking? Authorizing Provider  ?spironolactone (ALDACTONE) 25 MG tablet TAKE ONE TABLET BY MOUTH AT BEDTIME 07/14/21   Bensimhon, Bevelyn Buckles, MD  ?aspirin EC 81 MG EC tablet Take 1 tablet (81 mg total) by mouth daily. Swallow whole. 05/11/21   Cipriano Bunker, MD  ?atorvastatin (LIPITOR) 40 MG tablet TAKE ONE TABLET BY MOUTH EVERYDAY TABLET 6PM FOR CHOLESTEROL 05/23/21   Bensimhon, Bevelyn Buckles, MD  ?carvedilol (COREG) 3.125 MG tablet TAKE ONE TABLET BY MOUTH TWO TIMES A DAY WITH MEALS 04/18/21   Bensimhon, Bevelyn Buckles, MD  ?digoxin (LANOXIN) 0.125 MG tablet TAKE ONE TABLET BY MOUTH DAILY 05/18/21   Bensimhon, Bevelyn Buckles, MD  ?FARXIGA 10 MG TABS tablet TAKE ONE TABLET BY MOUTH DAILY IN THE MORNING BEFORE BREAKFAST 05/23/21   Bensimhon, Bevelyn Buckles, MD  ?ferrous sulfate 325 (  65 FE) MG tablet TAKE ONE TABLET BY MOUTH TWO TIMES A DAY WITH MEALS 04/25/21   Bensimhon, Bevelyn Buckles, MD  ?furosemide (LASIX) 40 MG tablet Take 1 tablet (40 mg total) by mouth daily. 03/09/21   Jacklynn Ganong, FNP  ?potassium chloride SA (KLOR-CON) 20 MEQ tablet Take 1 tablet (20 mEq total) by mouth daily. 03/09/21   Jacklynn Ganong, FNP  ?sacubitril-valsartan (ENTRESTO) 97-103 MG Take 1 tablet by mouth 2 (two) times daily. 06/14/21   Jacklynn Ganong, FNP  ?sertraline (ZOLOFT) 25 MG tablet Take 1 tablet (25 mg total) by mouth daily. 12/22/20   Saguier, Ramon Dredge, PA-C   ? ? ?Physical Exam: ?Vitals:  ? 09/20/21 1830 09/20/21 1900 09/20/21 1915 09/20/21 1930  ?BP: 131/72 128/75 131/73 136/76  ?Pulse: 88 84 91 90  ?Resp: 18 14 19 17   ?Temp:      ?TempSrc:      ?SpO2: 97% 96% 95% 95%  ? ?Constitutional: NAD, calm, comfortable, thin male  ?Eyes: PERRL, lids and conjunctivae normal ?ENMT: Mucous membranes are moist. ?Neck: normal, supple ?Respiratory: Diffuse rhonchi.  Normal respiratory effort on room air. No accessory muscle use.  ?Cardiovascular: Regular rate and rhythm, no murmurs / rubs / gallops.  +4 pitting edema up to knee on bilateral lower extremity  ?abdomen: no tenderness, Bowel sounds positive.  ?Musculoskeletal: no clubbing / cyanosis. No joint deformity upper and lower extremities.  ?Skin: no rashes, lesions, ulcers. No induration ?Neurologic: CN 2-12 grossly intact.  Strength 5/5 in all 4.  ?Psychiatric: poor insight. Alert and oriented x 3. Normal mood. ?Data Reviewed: ? ?HPI ? ?Assessment and Plan: ?* Acute on chronic heart failure (HCC) ?Presented with increasing lower extremity edema, shortness of breath due to medication nonadherence.  Patient is illiterate. ?- BNP greater than 4500 on admission ?-Last echocardiogram in December 2022 with EF of 20 to 25% and grade 2 diastolic dysfunction ?-Digoxin was subtherapeutic.  Needs advanced heart failure team consult. ?-Continue Farxiga, Entresto, digoxin and spironolactone ?- Continue IV 40 mg Lasix daily ?- Strict intake and output, daily weight ? ?Essential hypertension ?Continue Entresto and IV Lasix ? ?CAD (coronary artery disease) ?S/p CABG ?No acute chest pain at this time ?Continue aspirin and statin ? ? ? ? ? Advance Care Planning:   Code Status: Full Code  ? ?Consults: None-needs cardiology heart failure team consult tomorrow ? ?Family Communication: No family bedside ? ?Severity of Illness: ?The appropriate patient status for this patient is OBSERVATION. Observation status is judged to be reasonable and necessary  in order to provide the required intensity of service to ensure the patient's safety. The patient's presenting symptoms, physical exam findings, and initial radiographic and laboratory data in the context of their medical condition is felt to place them at decreased risk for further clinical deterioration. Furthermore, it is anticipated that the patient will be medically stable for discharge from the hospital within 2 midnights of admission.  ? ?Author: 05-25-2002, DO ?09/20/2021 8:59 PM ? ?For on call review [email or web address].  ?

## 2021-09-20 NOTE — ED Triage Notes (Signed)
Pt arrives via [facility] from home for CHF exacerbation. Pt has been having SOB and DOE. Pt is followed by community outpatient services, but states that he has been non-compliant with medications and appointments due to poor literacy.  ? ?#18 L FA by EMS.  ? ?VSS with EMS throughout transport.  ?

## 2021-09-20 NOTE — Assessment & Plan Note (Signed)
S/p CABG ?No acute chest pain at this time ?Continue aspirin and statin ?

## 2021-09-20 NOTE — Assessment & Plan Note (Addendum)
Blood pressure controlled continue with spironolactone, carvedilol and Entresto.  ?Continue with dapagliflozin and furosemide.  ?

## 2021-09-20 NOTE — Assessment & Plan Note (Addendum)
Acute on chronic systolic heart failure.  ? ?Patient was admitted to the cardiac ward and received aggressive diuresis with IV furosemide, negative fluid balance was achieved with improvement in his symptoms.  ? ?Echocardiogram from 04/2021 with LV 20 to 25% ejection fraction, global hypokinesis, moderate reduction in RV systolic function, RVSP 43,2 mmHg. Moderate mitral valve regurgitation.  ? ?Patient has been advised about compliance with his medications.  ? ?Continue heart failure therapy with spironolactone, dapagliflozin, carvedilol and digoxin.  ?Compression stockings.  ? ?

## 2021-09-21 ENCOUNTER — Other Ambulatory Visit: Payer: Self-pay

## 2021-09-21 ENCOUNTER — Encounter (HOSPITAL_COMMUNITY): Payer: Self-pay | Admitting: Family Medicine

## 2021-09-21 DIAGNOSIS — E785 Hyperlipidemia, unspecified: Secondary | ICD-10-CM | POA: Diagnosis present

## 2021-09-21 DIAGNOSIS — I10 Essential (primary) hypertension: Secondary | ICD-10-CM | POA: Diagnosis not present

## 2021-09-21 DIAGNOSIS — D649 Anemia, unspecified: Secondary | ICD-10-CM | POA: Diagnosis present

## 2021-09-21 DIAGNOSIS — Z91148 Patient's other noncompliance with medication regimen for other reason: Secondary | ICD-10-CM | POA: Diagnosis not present

## 2021-09-21 DIAGNOSIS — K7460 Unspecified cirrhosis of liver: Secondary | ICD-10-CM | POA: Diagnosis present

## 2021-09-21 DIAGNOSIS — R062 Wheezing: Secondary | ICD-10-CM | POA: Diagnosis present

## 2021-09-21 DIAGNOSIS — I5043 Acute on chronic combined systolic (congestive) and diastolic (congestive) heart failure: Secondary | ICD-10-CM | POA: Diagnosis not present

## 2021-09-21 DIAGNOSIS — I340 Nonrheumatic mitral (valve) insufficiency: Secondary | ICD-10-CM | POA: Diagnosis present

## 2021-09-21 DIAGNOSIS — Z87891 Personal history of nicotine dependence: Secondary | ICD-10-CM | POA: Diagnosis not present

## 2021-09-21 DIAGNOSIS — N179 Acute kidney failure, unspecified: Secondary | ICD-10-CM | POA: Diagnosis present

## 2021-09-21 DIAGNOSIS — Z79899 Other long term (current) drug therapy: Secondary | ICD-10-CM | POA: Diagnosis not present

## 2021-09-21 DIAGNOSIS — Z550 Illiteracy and low-level literacy: Secondary | ICD-10-CM | POA: Diagnosis not present

## 2021-09-21 DIAGNOSIS — E876 Hypokalemia: Secondary | ICD-10-CM | POA: Diagnosis present

## 2021-09-21 DIAGNOSIS — Z951 Presence of aortocoronary bypass graft: Secondary | ICD-10-CM | POA: Diagnosis not present

## 2021-09-21 DIAGNOSIS — Z9861 Coronary angioplasty status: Secondary | ICD-10-CM | POA: Diagnosis not present

## 2021-09-21 DIAGNOSIS — E440 Moderate protein-calorie malnutrition: Secondary | ICD-10-CM | POA: Diagnosis present

## 2021-09-21 DIAGNOSIS — Z7984 Long term (current) use of oral hypoglycemic drugs: Secondary | ICD-10-CM | POA: Diagnosis not present

## 2021-09-21 DIAGNOSIS — Z7982 Long term (current) use of aspirin: Secondary | ICD-10-CM | POA: Diagnosis not present

## 2021-09-21 DIAGNOSIS — I2510 Atherosclerotic heart disease of native coronary artery without angina pectoris: Secondary | ICD-10-CM | POA: Diagnosis not present

## 2021-09-21 DIAGNOSIS — Z6821 Body mass index (BMI) 21.0-21.9, adult: Secondary | ICD-10-CM | POA: Diagnosis not present

## 2021-09-21 DIAGNOSIS — Z91199 Patient's noncompliance with other medical treatment and regimen due to unspecified reason: Secondary | ICD-10-CM | POA: Diagnosis not present

## 2021-09-21 DIAGNOSIS — I444 Left anterior fascicular block: Secondary | ICD-10-CM | POA: Diagnosis present

## 2021-09-21 DIAGNOSIS — I110 Hypertensive heart disease with heart failure: Secondary | ICD-10-CM | POA: Diagnosis present

## 2021-09-21 LAB — BASIC METABOLIC PANEL
Anion gap: 12 (ref 5–15)
BUN: 7 mg/dL — ABNORMAL LOW (ref 8–23)
CO2: 22 mmol/L (ref 22–32)
Calcium: 8.5 mg/dL — ABNORMAL LOW (ref 8.9–10.3)
Chloride: 105 mmol/L (ref 98–111)
Creatinine, Ser: 1.14 mg/dL (ref 0.61–1.24)
GFR, Estimated: >60 mL/min (ref >60)
Glucose, Bld: 82 mg/dL (ref 70–99)
Potassium: 3.3 mmol/L — ABNORMAL LOW (ref 3.5–5.1)
Sodium: 139 mmol/L (ref 135–145)

## 2021-09-21 MED ORDER — SERTRALINE HCL 50 MG PO TABS
25.0000 mg | ORAL_TABLET | Freq: Every day | ORAL | Status: DC
  Administered 2021-09-21 – 2021-09-24 (×4): 25 mg via ORAL
  Filled 2021-09-21 (×4): qty 1

## 2021-09-21 MED ORDER — DIGOXIN 125 MCG PO TABS
125.0000 ug | ORAL_TABLET | Freq: Every day | ORAL | Status: DC
  Administered 2021-09-21 – 2021-09-24 (×4): 125 ug via ORAL
  Filled 2021-09-21 (×4): qty 1

## 2021-09-21 MED ORDER — POTASSIUM CHLORIDE CRYS ER 20 MEQ PO TBCR
20.0000 meq | EXTENDED_RELEASE_TABLET | Freq: Every day | ORAL | Status: DC
  Administered 2021-09-21: 20 meq via ORAL
  Filled 2021-09-21: qty 1

## 2021-09-21 MED ORDER — FUROSEMIDE 10 MG/ML IJ SOLN
60.0000 mg | Freq: Two times a day (BID) | INTRAMUSCULAR | Status: DC
Start: 2021-09-21 — End: 2021-09-22
  Administered 2021-09-21 – 2021-09-22 (×2): 60 mg via INTRAVENOUS
  Filled 2021-09-21: qty 6

## 2021-09-21 MED ORDER — SPIRONOLACTONE 25 MG PO TABS
25.0000 mg | ORAL_TABLET | Freq: Every day | ORAL | Status: DC
  Administered 2021-09-21 – 2021-09-23 (×4): 25 mg via ORAL
  Filled 2021-09-21 (×5): qty 1

## 2021-09-21 MED ORDER — ATORVASTATIN CALCIUM 40 MG PO TABS
40.0000 mg | ORAL_TABLET | Freq: Every evening | ORAL | Status: DC
Start: 2021-09-21 — End: 2021-09-24
  Administered 2021-09-21 – 2021-09-23 (×3): 40 mg via ORAL
  Filled 2021-09-21 (×3): qty 1

## 2021-09-21 MED ORDER — SACUBITRIL-VALSARTAN 49-51 MG PO TABS
1.0000 | ORAL_TABLET | Freq: Two times a day (BID) | ORAL | Status: DC
  Administered 2021-09-21 – 2021-09-24 (×8): 1 via ORAL
  Filled 2021-09-21 (×10): qty 1

## 2021-09-21 MED ORDER — POTASSIUM CHLORIDE CRYS ER 20 MEQ PO TBCR
40.0000 meq | EXTENDED_RELEASE_TABLET | Freq: Once | ORAL | Status: AC
  Administered 2021-09-21: 40 meq via ORAL
  Filled 2021-09-21: qty 2

## 2021-09-21 MED ORDER — DAPAGLIFLOZIN PROPANEDIOL 10 MG PO TABS
10.0000 mg | ORAL_TABLET | Freq: Every day | ORAL | Status: DC
  Administered 2021-09-21 – 2021-09-24 (×4): 10 mg via ORAL
  Filled 2021-09-21 (×5): qty 1

## 2021-09-21 MED ORDER — CARVEDILOL 3.125 MG PO TABS
3.1250 mg | ORAL_TABLET | Freq: Two times a day (BID) | ORAL | Status: DC
  Administered 2021-09-21 – 2021-09-24 (×5): 3.125 mg via ORAL
  Filled 2021-09-21 (×7): qty 1

## 2021-09-21 MED ORDER — FERROUS SULFATE 325 (65 FE) MG PO TABS
325.0000 mg | ORAL_TABLET | Freq: Two times a day (BID) | ORAL | Status: DC
  Administered 2021-09-21 – 2021-09-24 (×7): 325 mg via ORAL
  Filled 2021-09-21 (×7): qty 1

## 2021-09-21 NOTE — Care Management Obs Status (Signed)
MEDICARE OBSERVATION STATUS NOTIFICATION ? ? ?Patient Details  ?Name: Jeremy Powers ?MRN: [id number] ?Date of Birth: Aug 06, 1955 ? ? ?Medicare Observation Status Notification Given:  Yes ? ? ? ?Oletta Cohn, RN ?09/21/2021, 1:40 PM ?

## 2021-09-21 NOTE — Assessment & Plan Note (Signed)
Continue with atorvastatin 

## 2021-09-21 NOTE — Hospital Course (Addendum)
Jeremy Powers was admitted to the hospital with the working diagnosis of decompensated heart failure ? ?66 yo male with the past medical history of systolic heart failure, coronary artery disease, and hypertension who presented with lower extremity edema and dyspnea. Worsening symptoms for several weeks. Suspected patient non compliant with his medications at home. Frequent hospitalizations for heart failure. On his initial physical examination his blood pressure was 131/72, HR 88, RR 18 02 saturation 97%, lungs with bilateral rhonchi, heart with S1 and S2 present and rhythmic, abdomen soft, positive lower extremity edema, pitting ++++.  ? ?Na 139, K 3,5 Cl 104, bicarbonate 23, glucose 84 bun 6 cr 0,96  ?BNP >4,500  ?Wbc 5,0 hgb 12,2 plt 188  ? ?Chest radiograph with cardiomegaly with mild hilar vascular congestion. ? ?EKG 92 bom, left axis deviation, left anterior fascicular block, qtc 629, sinus rhythm with poor R wave progression, no significant ST segment or T wave changes, positive LVH.  ? ?Placed on furosemide for diuresis with improvement in volume status. ?Plan to follow up as outpatient, he was advised about compliance with his medications.  ?

## 2021-09-21 NOTE — Progress Notes (Signed)
?  Progress Note ? ? ?Patient: Jeremy Powers [id number] DOB: 08/17/1955 DOA: 09/20/2021     0 ?DOS: the patient was seen and examined on 09/21/2021 ?  ?Brief hospital course: ?Jeremy Powers was admitted to the hospital with the working diagnosis of decompensated heart failure ? ?66 yo male with the past medical history of systolic heart failure, coronary artery disease, and hypertension who presented with lower extremity edema and dyspnea. Worsening symptoms for several weeks. Suspected patient non compliant with his medications at home. Frequent hospitalizations for heart failure. On his initial physical examination his blood pressure was 131/72, HR 88, RR 18 02 saturation 97%, lungs with bilateral rhonchi, heart with S1 and S2 present and rhythmic, abdomen soft, positive lower extremity edema, pitting ++++.  ? ?Na 139, K 3,5 Cl 104, bicarbonate 23, glucose 84 bun 6 cr 0,96  ?BNP >4,500  ?Wbc 5,0 hgb 12,2 plt 188  ? ?Chest radiograph with cardiomegaly with mild hilar vascular congestion. ? ?EKG 92 bom, left axis deviation, left anterior fascicular block, qtc 629, sinus rhythm with poor R wave progression, no significant ST segment or T wave changes, positive LVH.  ? ?Assessment and Plan: ?* Acute on chronic heart failure ([place]) ?Acute on chronic systolic heart failure.  ? ?Echocardiogram from 04/2021 with LV 20 to 25% ejection fraction, global hypokinesis, moderate reduction in RV systolic function, RVSP [id number] mmHg. Moderate mitral valve regurgitation.  ? ?Continue volume overloaded.  ?Systolic blood pressure 0000000 to 120 mmHg. ?Warm lower extremities.  ? ?Increase furosemide to 60 mg IV q12 hrs ?continue with spironolactone and dapagliflozin  ? ? ? ?CAD (coronary artery disease) ?S/p CABG ?No acute chest pain at this time ?Continue aspirin and statin ? ?Essential hypertension ?Continue blood pressure monitoring ?Continue diuresis with furosemide and spironolactone.  ?Dapagliflozin.  ? ? ?Dyslipidemia ?Continue with  atorvastatin.  ? ?Anemia ?Continue with iron supplementation.  ? ?Hypokalemia ?Renal function with serum cr at 1,14, K is 3,3 and serum bicarbonate at 22. ? ?Add total of 60 meq today and follow up renal function and electrolytes in am, including Mg.  ? ? ? ? ?  ? ?Subjective: Patient continue to have edema and dyspnea, improved but not back to baseline  ? ?Physical Exam: ?Vitals:  ? 09/21/21 1130 09/21/21 1145 09/21/21 1213 09/21/21 1528  ?BP: (!) 109/55 (!) 116/56  121/74  ?Pulse: 72 72  76  ?Resp: 18 13  16   ?Temp:   98.5 ?F (36.9 ?C) 97.6 ?F (36.4 ?C)  ?TempSrc:   Oral Oral  ?SpO2: 98% 97%  99%  ?Weight:    63.9 kg  ?Height:    5\' 7"  (1.702 m)  ? ?Neurology awake and alert ?ENT with mild pallor ?Cardiovascular with S1 and S2 present and rhythmic with no gallops or murmurs ?Respiratory with bilateral rales but not wheezing ?Abdomen not distended ?Positive lower extremity edema +++ bilaterally pitting  ?Data Reviewed: ? ? ? ?Family Communication: no family at the bedside  ? ?Disposition: ?Status is: Inpatient ?Remains inpatient appropriate because: heart failure  ? Planned Discharge Destination: Home ? ? ? ?Author: ?Tawni Millers, MD ?09/21/2021 5:38 PM ? ?For on call review [email or web address].  ?

## 2021-09-21 NOTE — Assessment & Plan Note (Signed)
Continue with iron supplementation.  

## 2021-09-21 NOTE — Progress Notes (Signed)
Heart Failure Navigator Progress Note  Assessed for Heart & Vascular TOC clinic readiness.  Patient does not meet criteria due to established with Advanced Heart Failure Team. .   Glinda Natzke, BSN, RN Heart Failure Nurse Navigator Secure Chat Only   

## 2021-09-21 NOTE — Assessment & Plan Note (Addendum)
Hypokalemia, hypomagnesemia.  ? ?Patient responded well to diuretic therapy, at the time of his discharge renal function with serum cr at 1.0 with K at 3,4 and serum bicarbonate 28. Mg 1,5 ? ?Patient will receive 40 meq Kcl x2 and 4 g mag sulfate before discharge. ?Follow up renal function as outpatient.  ?Continue with furosemide and spironolactone.  ?

## 2021-09-22 DIAGNOSIS — I5043 Acute on chronic combined systolic (congestive) and diastolic (congestive) heart failure: Secondary | ICD-10-CM | POA: Diagnosis not present

## 2021-09-22 DIAGNOSIS — N179 Acute kidney failure, unspecified: Secondary | ICD-10-CM | POA: Diagnosis not present

## 2021-09-22 DIAGNOSIS — D649 Anemia, unspecified: Secondary | ICD-10-CM | POA: Diagnosis not present

## 2021-09-22 DIAGNOSIS — I2510 Atherosclerotic heart disease of native coronary artery without angina pectoris: Secondary | ICD-10-CM | POA: Diagnosis not present

## 2021-09-22 LAB — BASIC METABOLIC PANEL
Anion gap: 10 (ref 5–15)
BUN: 10 mg/dL (ref 8–23)
CO2: 25 mmol/L (ref 22–32)
Calcium: 8.1 mg/dL — ABNORMAL LOW (ref 8.9–10.3)
Chloride: 104 mmol/L (ref 98–111)
Creatinine, Ser: 1.45 mg/dL — ABNORMAL HIGH (ref 0.61–1.24)
GFR, Estimated: 53 mL/min — ABNORMAL LOW (ref >60)
Glucose, Bld: 100 mg/dL — ABNORMAL HIGH (ref 70–99)
Potassium: 3.5 mmol/L (ref 3.5–5.1)
Sodium: 139 mmol/L (ref 135–145)

## 2021-09-22 LAB — MAGNESIUM: Magnesium: 1.5 mg/dL — ABNORMAL LOW (ref 1.7–2.4)

## 2021-09-22 MED ORDER — POTASSIUM CHLORIDE CRYS ER 20 MEQ PO TBCR
40.0000 meq | EXTENDED_RELEASE_TABLET | Freq: Once | ORAL | Status: AC
  Administered 2021-09-22: 40 meq via ORAL
  Filled 2021-09-22: qty 2

## 2021-09-22 MED ORDER — ENSURE ENLIVE PO LIQD
237.0000 mL | Freq: Three times a day (TID) | ORAL | Status: DC
  Administered 2021-09-22 – 2021-09-24 (×4): 237 mL via ORAL

## 2021-09-22 MED ORDER — MAGNESIUM SULFATE 2 GM/50ML IV SOLN
2.0000 g | Freq: Once | INTRAVENOUS | Status: AC
  Administered 2021-09-22: 2 g via INTRAVENOUS
  Filled 2021-09-22: qty 50

## 2021-09-22 MED ORDER — FUROSEMIDE 10 MG/ML IJ SOLN
60.0000 mg | Freq: Every day | INTRAMUSCULAR | Status: DC
  Administered 2021-09-23: 60 mg via INTRAVENOUS
  Filled 2021-09-22: qty 6

## 2021-09-22 NOTE — TOC Progression Note (Signed)
Transition of Care (TOC) - Progression Note  ? ? ?Patient Details  ?Name: Jeremy Powers ?MRN: [id number] ?Date of Birth: [birth year] ? ?Transition of Care (TOC) CM/SW Contact  ?Leone Haven, RN ?Phone Number: ?09/22/2021, 6:37 PM ? ?Clinical Narrative:    ?From home, presents with CHF, continues on IV lasix, TOC will continue to follow for dc needs. ? ? ?  ?  ? ?Expected Discharge Plan and Services ?  ?  ?  ?  ?  ?                ?  ?  ?  ?  ?  ?  ?  ?  ?  ?  ? ? ?Social Determinants of Health (SDOH) Interventions ?  ? ?Readmission Risk Interventions ?   ? View : No data to display.  ?  ?  ?  ? ? ?

## 2021-09-22 NOTE — Progress Notes (Addendum)
?  Progress Note ? ? ?Patient: Jeremy Powers GYI:[id number] DOB: Oct 09, 1955 DOA: 09/20/2021     1 ?DOS: the patient was seen and examined on 09/22/2021 ?  ?Brief hospital course: ?Jeremy Powers was admitted to the hospital with the working diagnosis of decompensated heart failure ? ?66 yo male with the past medical history of systolic heart failure, coronary artery disease, and hypertension who presented with lower extremity edema and dyspnea. Worsening symptoms for several weeks. Suspected patient non compliant with his medications at home. Frequent hospitalizations for heart failure. On his initial physical examination his blood pressure was 131/72, HR 88, RR 18 02 saturation 97%, lungs with bilateral rhonchi, heart with S1 and S2 present and rhythmic, abdomen soft, positive lower extremity edema, pitting ++++.  ? ?Na 139, K 3,5 Cl 104, bicarbonate 23, glucose 84 bun 6 cr 0,96  ?BNP >4,500  ?Wbc 5,0 hgb 12,2 plt 188  ? ?Chest radiograph with cardiomegaly with mild hilar vascular congestion. ? ?EKG 92 bom, left axis deviation, left anterior fascicular block, qtc 629, sinus rhythm with poor R wave progression, no significant ST segment or T wave changes, positive LVH.  ? ?Placed on furosemide for diuresis.  ? ?Assessment and Plan: ?* Acute on chronic heart failure (HCC) ?Acute on chronic systolic heart failure.  ? ?Echocardiogram from 04/2021 with LV 20 to 25% ejection fraction, global hypokinesis, moderate reduction in RV systolic function, RVSP 43,2 mmHg. Moderate mitral valve regurgitation.  ? ?Improved volume status but not back to baseline, suspected not accurate urine collection, documeted output is 825 ml.  ?Systolic blood pressure 115 to 109 mmHg.  ? ?Continue with furosemide to 60 mg IV q12 hrs, spironolactone and dapagliflozin  ? ? ? ?AKI (acute kidney injury) (HCC) ?Hypokalemia, hypomagnesemia.  ? ?Renal function with serum cr at 1,45, K is 3,5 and serum bicarbonate at 25, Mg is 1,5 Na 139. ? ?Plan to continue  diuresis with furosemide and spironolactone. ?Follow up renal function in am, avoid hypotension and nephrotoxic medications.  ?Add Mg and K.   ? ?CAD (coronary artery disease) ?S/p CABG ?No acute chest pain at this time ?Continue aspirin and statin ? ?Essential hypertension ?Continue diuresis with furosemide and spironolactone.  ?Dapagliflozin and Entresto.  ? ? ?Dyslipidemia ?Continue with atorvastatin.  ? ?Anemia ?Continue with iron supplementation.  ? ? ? ? ?  ? ?Subjective: Patient is feeling better edema is improving but not back to baseline, no chest pain, no nausea or vomiting.  ? ?Physical Exam: ?Vitals:  ? 09/22/21 0002 09/22/21 0503 09/22/21 0801 09/22/21 1142  ?BP: 118/60 (!) 115/55 123/75 109/63  ?Pulse: 72 67 70 61  ?Resp: 20 15 11 13   ?Temp: 97.7 ?F (36.5 ?C) 97.7 ?F (36.5 ?C) 97.6 ?F (36.4 ?C) 97.6 ?F (36.4 ?C)  ?TempSrc: Oral Oral Oral Oral  ?SpO2: 97% 97% 98% 96%  ?Weight:  63.7 kg    ?Height:      ? ?Neurology awake and alert ?ENT with no pallor ?Cardiovascular with S1 and S2 present and rhythmic with positive systolic murmur at the apex, no gallops ?Mild JVD ?Positive lower extremity edema + to ++ ?Respiratory with bilateral rales ?Abdomen not distended  ?Data Reviewed: ? ? ? ?Family Communication: no family at the bedside  ? ?Disposition: ?Status is: Inpatient ?Remains inpatient appropriate because: heart failure on IV furosemide  ? Planned Discharge Destination: Home ? ? ? ?Author: ? , MD ?09/22/2021 1:14 PM ? ?For on call review [email or web address].  ?

## 2021-09-22 NOTE — Consult Note (Signed)
? ?  [facility] CM Inpatient Consult ? ? ?09/22/2021 ? ?Rosana Fret ?1955/08/08 ?[id number] ? ?[place] Organization [ACO] Patient:  Medicare ACO REACH ? ?Primary Care Provider:  Saguier, Iris Pert, [place] [facility] ? ? ?Patient was discussed in unit progression meeting for hospitalization with noted for a recent referral to [facility] provider office to assess for potential [place] [facility] service needs for post hospital transition.  Review of patient's medical record reveals patient's paramedicine team had difficulty maintaining contact. ? ?Met with the patient at the bedside.  Patient states he has a new phone and does not know the number.  He states, "I am in a bit of a mess since I have not been able to get to my appointments. They used to come and pick me up and they stopped coming."  Patient was not sure of the name of who was picking him up and why they stop picking him up.  Patient states, "you can call my friend girl, Enid Derry [phone] per patient's phone], or her daughter Philis Nettle [phone] per patient's phone]" at the bedside. Patient states, "I usually keep a car around for [facility] to drive because I don't drive but the car has been down for a while now." Call placed to [place] at number provider and voicemail identified as Alvina Chou, was able to leave a voicemail with HIPAA compliant request for a return call number and name. ? ?However, patient denies having trouble getting groceries and not having difficulty with cost or medication cost.  He states he has someone that delivers his medications but Enid Derry helps him keep up with taking his medications. Spoke with Amy, HF regarding missed appointments with the HF team. ? ?1420 Met with patient again to follow up to see if Enid Derry has come or called.  Patient states he called also and got the voicemail. He will have her to call this writer back for  transportation updates and medications. ? ?Plan:  Continue to follow progress and disposition to assess for post hospital care management needs.   ? ?For questions contact:  ? ?Natividad Brood, RN BSN CCM ?[facility] Liaison ? [phone] business mobile phone ?Toll free office [phone]  ?Fax number: [phone] ?[email or web address]  ? ? ? ?

## 2021-09-22 NOTE — Progress Notes (Addendum)
Initial Nutrition Assessment ? ?DOCUMENTATION CODES:  ? ?Non-severe (moderate) malnutrition in context of chronic illness ? ?INTERVENTION:  ?Provide Ensure Enlive po TID, each supplement provides 350 kcal and 20 grams of protein. ? ?Encourage adequate PO intake.  ? ?NUTRITION DIAGNOSIS:  ? ?Moderate Malnutrition related to chronic illness (CHF) as evidenced by moderate fat depletion, moderate muscle depletion. ? ?GOAL:  ? ?Patient will meet greater than or equal to 90% of their needs ? ?MONITOR:  ? ?PO intake, Supplement acceptance, Labs, Weight trends, Skin, I & O's ? ?REASON FOR ASSESSMENT:  ? ?Consult ?Assessment of nutrition requirement/status ? ?ASSESSMENT:  ? ?66 yo male with the past medical history of systolic heart failure, coronary artery disease, and hypertension who presented with lower extremity edema and dyspnea. Pt with acute on chronic heart failure. ? ?Meal completion has been 25%. Pt reports having a good appetite with usual consumption of at least 2-3 meals a day with no difficulties. Pt currently diuresing. RD to order nutritional supplements to aid in caloric and protein needs. Pt encouraged to eat his food at meals and to drink his supplements.  ? ?NUTRITION - FOCUSED PHYSICAL EXAM: ? ?Flowsheet Row Most Recent Value  ?Orbital Region Moderate depletion  ?Upper Arm Region Severe depletion  ?Thoracic and Lumbar Region Moderate depletion  ?Buccal Region Moderate depletion  ?Temple Region Moderate depletion  ?Clavicle Bone Region Moderate depletion  ?Clavicle and Acromion Bone Region Moderate depletion  ?Scapular Bone Region Unable to assess  ?Dorsal Hand Moderate depletion  ?Patellar Region Moderate depletion  ?Anterior Thigh Region Moderate depletion  ?Posterior Calf Region Moderate depletion  ?Edema (RD Assessment) Mild  ?Hair Reviewed  ?Eyes Reviewed  ?Mouth Reviewed  ?Skin Reviewed  ?Nails Reviewed  ? ?  ? ?Labs and medications reviewed.  ? ?Diet Order:   ?Diet Order   ? ?       ?  Diet Heart  Room service appropriate? Yes; Fluid consistency: Thin; Fluid restriction: 1200 mL Fluid  Diet effective now       ?  ? ?  ?  ? ?  ? ? ?EDUCATION NEEDS:  ? ?Not appropriate for education at this time ? ?Skin:  Skin Assessment: Reviewed RN Assessment ? ?Last BM:  5/5 ? ?Height:  ? ?Ht Readings from Last 1 Encounters:  ?09/21/21 5\' 7"  (1.702 m)  ? ? ?Weight:  ? ?Wt Readings from Last 1 Encounters:  ?09/22/21 63.7 kg  ? ?BMI:  Body mass index is 21.99 kg/m?. ? ?Estimated Nutritional Needs:  ? ?Kcal:  1900-2100 ? ?Protein:  90-105 grams ? ?Fluid:  1.2 L/day ? ?11/22/21, MS, RD, LDN ?RD pager number/after hours weekend pager number on Amion. ? ?

## 2021-09-23 DIAGNOSIS — I5043 Acute on chronic combined systolic (congestive) and diastolic (congestive) heart failure: Secondary | ICD-10-CM | POA: Diagnosis not present

## 2021-09-23 DIAGNOSIS — I10 Essential (primary) hypertension: Secondary | ICD-10-CM | POA: Diagnosis not present

## 2021-09-23 DIAGNOSIS — D649 Anemia, unspecified: Secondary | ICD-10-CM | POA: Diagnosis not present

## 2021-09-23 DIAGNOSIS — N179 Acute kidney failure, unspecified: Secondary | ICD-10-CM | POA: Diagnosis not present

## 2021-09-23 DIAGNOSIS — E440 Moderate protein-calorie malnutrition: Secondary | ICD-10-CM | POA: Diagnosis present

## 2021-09-23 LAB — BASIC METABOLIC PANEL
Anion gap: 7 (ref 5–15)
BUN: 10 mg/dL (ref 8–23)
CO2: 27 mmol/L (ref 22–32)
Calcium: 7.7 mg/dL — ABNORMAL LOW (ref 8.9–10.3)
Chloride: 102 mmol/L (ref 98–111)
Creatinine, Ser: 1.28 mg/dL — ABNORMAL HIGH (ref 0.61–1.24)
GFR, Estimated: >60 mL/min (ref >60)
Glucose, Bld: 114 mg/dL — ABNORMAL HIGH (ref 70–99)
Potassium: 3 mmol/L — ABNORMAL LOW (ref 3.5–5.1)
Sodium: 136 mmol/L (ref 135–145)

## 2021-09-23 LAB — MAGNESIUM: Magnesium: 1.6 mg/dL — ABNORMAL LOW (ref 1.7–2.4)

## 2021-09-23 MED ORDER — POTASSIUM CHLORIDE CRYS ER 20 MEQ PO TBCR
40.0000 meq | EXTENDED_RELEASE_TABLET | Freq: Two times a day (BID) | ORAL | Status: AC
  Administered 2021-09-23 (×2): 40 meq via ORAL
  Filled 2021-09-23 (×2): qty 2

## 2021-09-23 MED ORDER — MAGNESIUM SULFATE 2 GM/50ML IV SOLN
2.0000 g | Freq: Once | INTRAVENOUS | Status: AC
  Administered 2021-09-23: 2 g via INTRAVENOUS
  Filled 2021-09-23: qty 50

## 2021-09-23 MED ORDER — FUROSEMIDE 40 MG PO TABS
40.0000 mg | ORAL_TABLET | Freq: Every day | ORAL | Status: DC
  Administered 2021-09-24: 40 mg via ORAL
  Filled 2021-09-23: qty 1

## 2021-09-23 NOTE — Progress Notes (Signed)
?Progress Note ? ? ?Patient: Jeremy Powers CWC:[id number] DOB: [birth year] DOA: 09/20/2021     2 ?DOS: the patient was seen and examined on 09/23/2021 ?  ?Brief hospital course: ?Jeremy Powers was admitted to the hospital with the working diagnosis of decompensated heart failure ? ?66 yo male with the past medical history of systolic heart failure, coronary artery disease, and hypertension who presented with lower extremity edema and dyspnea. Worsening symptoms for several weeks. Suspected patient non compliant with his medications at home. Frequent hospitalizations for heart failure. On his initial physical examination his blood pressure was 131/72, HR 88, RR 18 02 saturation 97%, lungs with bilateral rhonchi, heart with S1 and S2 present and rhythmic, abdomen soft, positive lower extremity edema, pitting ++++.  ? ?Na 139, K 3,5 Cl 104, bicarbonate 23, glucose 84 bun 6 cr 0,96  ?BNP >4,500  ?Wbc 5,0 hgb 12,2 plt 188  ? ?Chest radiograph with cardiomegaly with mild hilar vascular congestion. ? ?EKG 92 bom, left axis deviation, left anterior fascicular block, qtc 629, sinus rhythm with poor R wave progression, no significant ST segment or T wave changes, positive LVH.  ? ?Placed on furosemide for diuresis.  ? ?Assessment and Plan: ?* Acute on chronic heart failure (HCC) ?Acute on chronic systolic heart failure.  ? ?Echocardiogram from 04/2021 with LV 20 to 25% ejection fraction, global hypokinesis, moderate reduction in RV systolic function, RVSP 43,2 mmHg. Moderate mitral valve regurgitation.  ? ?Urine output documented at 1,375 ml over last 24 hrs  ?Edema has improved.  ?Systolic blood pressure 113 to 116 mmHg  ? ?Will transition to oral furosemide  ?Continue with spironolactone and dapagliflozin  ?Continue with compression stockings.  ?Continue with carvedilol and digoxin.  ? ? ?AKI (acute kidney injury) (HCC) ?Hypokalemia, hypomagnesemia.  ? ?Improved volume status, renal function with serum cr at 1,28 with K at 3,0 and  serum bicarbonate at 27 ?Mg is 1,6 ? ?Plan to ad 40 meq Kcl x2 and 2 g mag sulfate.  ?Follow up renal function in am ?Transition to oral furosemide and continue with spironolactone.  ? ?CAD (coronary artery disease) ?S/p CABG ?No acute chest pain at this time ?Continue aspirin and statin ? ?Essential hypertension ?Blood pressure controlled continue with spironolactone and Entresto.  ?On dapagliflozin and furosemide.  ? ?Dyslipidemia ?Continue with atorvastatin.  ? ?Anemia ?Continue with iron supplementation.  ? ?Malnutrition of moderate degree ?Continue with nutritional supplements.  ? ? ? ? ?  ? ?Subjective: Patient is feeling better dyspnea and edema have improved,  ? ?Physical Exam: ?Vitals:  ? 09/23/21 0114 09/23/21 0149 09/23/21 7616 09/23/21 0737  ?BP: (!) 104/57 116/64 (!) 112/49 113/60  ?Pulse: 60 75 63 65  ?Resp: 13 (!) 22 12 12   ?Temp: 98 ?F (36.7 ?C) 98 ?F (36.7 ?C) 98.2 ?F (36.8 ?C) 98 ?F (36.7 ?C)  ?TempSrc: Oral Oral Oral Oral  ?SpO2:  98% 94% 96%  ?Weight:   62.9 kg   ?Height:      ? ?Neurology awake and alert ?ENT with no pallor ?Cardiovascular with S1 and S2 present and rhythmic with no gallops ?No JVD ?No lower extremity edema ?Respiratory with no rales or wheezing ?Abdomen not distended  ?Data Reviewed: ? ? ? ?Family Communication: no family a the bedside  ? ?Disposition: ?Status is: Inpatient ?Remains inpatient appropriate because: follow up renal function in am ? Planned Discharge Destination: Home ? ? ? ? ? ?Author: ? , MD ?09/23/2021 1:17 PM ? ?For on  call review [email or web address].  ?

## 2021-09-23 NOTE — Evaluation (Signed)
Physical Therapy Evaluation ?Patient Details ?Name: Jeremy Powers ?MRN: [id number] ?DOB: Oct 04, 1955 ?Today's Date: 09/23/2021 ? ?History of Present Illness ? 66 yo male with onset of LE edema and SOB was admitted 5/3.  Pt has been potentially non compliant with meds, but cannot read to share the history of meds with MD.  Pt has CHF, pancreatitis, chest discomfort.  PMHx:  CHF, EF 20-25%, CAD, CABG, PCI, HTN, smoker including THC, repeat admissions for heart failure  ?Clinical Impression ? Pt was seen for mobility after being up in chair awhile, and on room air demonstrated a good tolerance for slow progression of gait.  Pt is maintaining O2 sats and HR with mobility, and was on room air during the walk.  Pt has reduced his previous effort to walk outdoors, and since a year ago per his report, does not take walks in the community to go to the store.  Pt is appropriate for RW to steady himself, reducing assistance to supervision level with this support.  Follow acutely for these balance concerns and will have HHPT come to him for progression to outdoor mobility and strengthening.  Pt is hopefully going to then transition to more outdoor mobility to maintain his progress.  ?   ? ?Recommendations for follow up therapy are one component of a multi-disciplinary discharge planning process, led by the attending physician.  Recommendations may be updated based on patient status, additional functional criteria and insurance authorization. ? ?Follow Up Recommendations Home health PT ? ?  ?Assistance Recommended at Discharge Intermittent Supervision/Assistance  ?Patient can return home with the following ? A little help with walking and/or transfers;A little help with bathing/dressing/bathroom;Assistance with cooking/housework;Direct supervision/assist for financial management;Direct supervision/assist for medications management;Assist for transportation;Help with stairs or ramp for entrance ? ?  ?Equipment Recommendations Rolling  walker (2 wheels)  ?Recommendations for Other Services ?    ?  ?Functional Status Assessment Patient has had a recent decline in their functional status and demonstrates the ability to make significant improvements in function in a reasonable and predictable amount of time.  ? ?  ?Precautions / Restrictions Precautions ?Precautions: Fall ?Restrictions ?Weight Bearing Restrictions: No  ? ?  ? ?Mobility ? Bed Mobility ?Overal bed mobility: Needs Assistance ?  ?  ?  ?  ?  ?  ?General bed mobility comments: in chair when PT arrived ?  ? ?Transfers ?Overall transfer level: Needs assistance ?Equipment used: Rolling walker (2 wheels) ?Transfers: Sit to/from Stand ?Sit to Stand: Min guard ?  ?  ?  ?  ?  ?  ?  ? ?Ambulation/Gait ?Ambulation/Gait assistance: Min guard ?Gait Distance (Feet): 45 Feet ?Assistive device: Rolling walker (2 wheels), 1 person hand held assist ?Gait Pattern/deviations: Step-through pattern, Decreased stride length, Wide base of support ?Gait velocity: reduced ?Gait velocity interpretation: <1.31 ft/sec, indicative of household ambulator ?Pre-gait activities: standing balance ck ?General Gait Details: pt is laterally steadying himself with RW ? ?Stairs ?  ?  ?  ?  ?  ? ?Wheelchair Mobility ?  ? ?Modified Rankin (Stroke Patients Only) ?  ? ?  ? ?Balance Overall balance assessment: Needs assistance ?Sitting-balance support: Feet supported ?Sitting balance-Leahy Scale: Good ?  ?  ?Standing balance support: Bilateral upper extremity supported, During functional activity ?Standing balance-Leahy Scale: Fair ?  ?  ?  ?  ?  ?  ?  ?  ?  ?  ?  ?  ?   ? ? ? ?Pertinent Vitals/Pain Pain Assessment ?Pain Assessment:  No/denies pain  ? ? ?Home Living Family/patient expects to be discharged to:: Private residence ?Living Arrangements: Spouse/significant other ?Available Help at Discharge: Friend(s);Neighbor;Family ?Type of Home: Apartment ?Home Access: Level entry ?  ?  ?  ?Home Layout: One level ?Home Equipment:  None ?Additional Comments: has his girlfriend with him 24/7  ?  ?Prior Function Prior Level of Function : Independent/Modified Independent ?  ?  ?  ?  ?  ?  ?Mobility Comments: no DME used but has interest in getting RW ?ADLs Comments: ADLs and girlfriend assists with IADLs. ?  ? ? ?Hand Dominance  ? Dominant Hand: Right ? ?  ?Extremity/Trunk Assessment  ? Upper Extremity Assessment ?Upper Extremity Assessment: Generalized weakness (mild) ?  ? ?Lower Extremity Assessment ?Lower Extremity Assessment: Defer to PT evaluation ?  ? ?Cervical / Trunk Assessment ?Cervical / Trunk Assessment: Normal  ?Communication  ? Communication: No difficulties  ?Cognition Arousal/Alertness: Awake/alert ?Behavior During Therapy: [facility] for tasks assessed/performed ?Overall Cognitive Status: No family/caregiver present to determine baseline cognitive functioning ?  ?  ?  ?  ?  ?  ?  ?  ?  ?  ?  ?  ?  ?  ?  ?  ?General Comments: struggles to give a bit of history and fully explain PLOF ?  ?  ? ?  ?General Comments General comments (skin integrity, edema, etc.): pt is up moving with RW and cues for safety, noted HR is stable and O2 sats are unchanged with gait ? ?  ?Exercises    ? ?Assessment/Plan  ?  ?PT Assessment Patient needs continued PT services  ?PT Problem List Decreased strength;Decreased activity tolerance;Decreased balance;Decreased mobility;Decreased coordination;Decreased knowledge of use of DME ? ?   ?  ?PT Treatment Interventions DME instruction;Gait training;Stair training;Functional mobility training;Therapeutic activities;Therapeutic exercise;Balance training;Neuromuscular re-education;Patient/family education   ? ?PT Goals (Current goals can be found in the Care Plan section)  ?Acute Rehab PT Goals ?Patient Stated Goal: to go home and do more walking as he did previously ?PT Goal Formulation: With patient ?Time For Goal Achievement: 09/30/21 ?Potential to Achieve Goals: Good ? ?  ?Frequency Min 3X/week ?  ? ? ?Co-evaluation    ?  ?  ?  ?  ? ? ?  ?AM-PAC PT "6 Clicks" Mobility  ?Outcome Measure Help needed turning from your back to your side while in a flat bed without using bedrails?: None ?Help needed moving from lying on your back to sitting on the side of a flat bed without using bedrails?: None ?Help needed moving to and from a bed to a chair (including a wheelchair)?: A Little ?Help needed standing up from a chair using your arms (e.g., wheelchair or bedside chair)?: A Little ?Help needed to walk in hospital room?: A Little ?Help needed climbing 3-5 steps with a railing? : A Lot ?6 Click Score: 19 ? ?  ?End of Session   ?Activity Tolerance: Patient limited by fatigue ?Patient left: in chair;with call bell/phone within reach;with chair alarm set ?Nurse Communication: Mobility status ?PT Visit Diagnosis: Unsteadiness on feet (R26.81);Muscle weakness (generalized) (M62.81);Difficulty in walking, not elsewhere classified (R26.2) ?  ? ?Time: 5732-2025 ?PT Time Calculation (min) (ACUTE ONLY): 25 min ? ? ?Charges:   PT Evaluation ?$PT Eval Moderate Complexity: 1 Mod ?PT Treatments ?$Gait Training: 8-22 mins ?  ?   ? ?Ivar Drape ?09/23/2021, 4:01 PM ? ?Samul Dada, PT PhD ?Acute Rehab Dept. Number: [facility] [phone] and [facility] [phone] ? ? ?

## 2021-09-23 NOTE — Assessment & Plan Note (Signed)
Continue with nutritional supplements.  

## 2021-09-23 NOTE — Evaluation (Signed)
Occupational Therapy Evaluation ?Patient Details ?Name: Jeremy Powers ?MRN: [id number] ?DOB: 01-15-1956 ?Today's Date: 09/23/2021 ? ? ?History of Present Illness  66 yo male presenting to ED on 5/3 with SOB and edema. Admitted back in December for acute on chronic CHF due to medication nonadherence. PMH including CHF, Cirrhosis, CAD, heart cath (2020), HTN, and pancreatitis.   ? ?Clinical Impression ?  ?PTA, pt was living with his girlfriend and was independent with ADLs; girlfriend performing IADLs. Pt presenting with Min Guard A level for ADLs and functional mobility. Pt presenting with decreased activity tolerance with fatigue. VSS on RA. Pt would benefit from further acute OT to facilitate safe dc. Recommend dc to home once medically stable per physician.   ? ?Recommendations for follow up therapy are one component of a multi-disciplinary discharge planning process, led by the attending physician.  Recommendations may be updated based on patient status, additional functional criteria and insurance authorization.  ? ?Follow Up Recommendations ? No OT follow up  ?  ?Assistance Recommended at Discharge Intermittent Supervision/Assistance  ?Patient can return home with the following A little help with walking and/or transfers;A little help with bathing/dressing/bathroom ? ?  ?Functional Status Assessment ? Patient has had a recent decline in their functional status and demonstrates the ability to make significant improvements in function in a reasonable and predictable amount of time.  ?Equipment Recommendations ? None recommended by OT  ?  ?Recommendations for Other Services   ? ? ?  ?Precautions / Restrictions Precautions ?Precautions: Fall  ? ?  ? ?Mobility Bed Mobility ?Overal bed mobility: Needs Assistance ?Bed Mobility: Supine to Sit ?  ?  ?Supine to sit: Supervision ?  ?  ?General bed mobility comments: Supervision for safety ?  ? ?Transfers ?Overall transfer level: Needs assistance ?Equipment used:  None ?Transfers: Sit to/from Stand ?Sit to Stand: Min guard ?  ?  ?  ?  ?  ?General transfer comment: Min Guard A for safety ?  ? ?  ?Balance Overall balance assessment: Needs assistance ?Sitting-balance support: No upper extremity supported, Feet supported ?Sitting balance-Leahy Scale: Good ?  ?  ?Standing balance support: No upper extremity supported, During functional activity ?Standing balance-Leahy Scale: Good ?  ?  ?  ?  ?  ?  ?  ?  ?  ?  ?  ?  ?   ? ?ADL either performed or assessed with clinical judgement  ? ?ADL Overall ADL's : Needs assistance/impaired ?Eating/Feeding: Set up;Sitting ?  ?Grooming: Wash/dry hands;Wash/dry face;Supervision/safety;Standing ?  ?Upper Body Bathing: Supervision/ safety;Set up;Sitting ?  ?Lower Body Bathing: Min guard;Sit to/from stand ?  ?Upper Body Dressing : Supervision/safety;Set up;Sitting ?  ?Lower Body Dressing: Min guard;Sit to/from stand ?  ?Toilet Transfer: Min guard;Ambulation;BSC/3in1;Grab bars ?  ?Toileting- Architect and Hygiene: Min guard;Sit to/from stand ?  ?  ?  ?Functional mobility during ADLs: Min guard ?General ADL Comments: Pt performing grooming, toileting, and functional mobility in room.  ? ? ? ?Vision   ?   ?   ?Perception   ?  ?Praxis   ?  ? ?Pertinent Vitals/Pain Pain Assessment ?Pain Assessment: No/denies pain  ? ? ? ?Hand Dominance Right ?  ?Extremity/Trunk Assessment Upper Extremity Assessment ?Upper Extremity Assessment: Overall WFL for tasks assessed ?  ?Lower Extremity Assessment ?Lower Extremity Assessment: Defer to PT evaluation ?  ?Cervical / Trunk Assessment ?Cervical / Trunk Assessment: Normal ?  ?Communication Communication ?Communication: No difficulties ?  ?Cognition Arousal/Alertness: Awake/alert ?Behavior During Therapy:  WFL for tasks assessed/performed ?Overall Cognitive Status: No family/caregiver present to determine baseline cognitive functioning ?  ?  ?  ?  ?  ?  ?  ?  ?  ?  ?  ?  ?  ?  ?  ?  ?General Comments: Feel pt  is baseline cognition. Reports he is unable to read. Slight tangiental. Following commands and cues. ?  ?  ?General Comments  VSS on RA ? ?  ?Exercises   ?  ?Shoulder Instructions    ? ? ?Home Living Family/patient expects to be discharged to:: Private residence ?Living Arrangements: Spouse/significant other ?Available Help at Discharge: Friend(s);Neighbor;Family ?Type of Home: Apartment ?Home Access: Level entry ?  ?  ?Home Layout: One level ?  ?  ?Bathroom Shower/Tub: Tub/shower unit ?  ?Bathroom Toilet: Standard ?  ?  ?Home Equipment: None ?  ?Additional Comments: pt reports his girlfriend is at home with him. ?  ? ?  ?Prior Functioning/Environment Prior Level of Function : Independent/Modified Independent ?  ?  ?  ?  ?  ?  ?Mobility Comments: doesnt DME ?ADLs Comments: ADLs and girlfriend assists with IADLs. ?  ? ?  ?  ?OT Problem List: Decreased strength;Decreased range of motion;Decreased activity tolerance;Impaired balance (sitting and/or standing);Decreased knowledge of use of DME or AE;Decreased knowledge of precautions ?  ?   ?OT Treatment/Interventions: Self-care/ADL training;Therapeutic exercise;Energy conservation;DME and/or AE instruction;Therapeutic activities;Patient/family education  ?  ?OT Goals(Current goals can be found in the care plan section) Acute Rehab OT Goals ?Patient Stated Goal: Go home ?OT Goal Formulation: With patient ?Time For Goal Achievement: 10/07/21 ?Potential to Achieve Goals: Good  ?OT Frequency: Min 2X/week ?  ? ?Co-evaluation   ?  ?  ?  ?  ? ?  ?AM-PAC OT "6 Clicks" Daily Activity     ?Outcome Measure Help from another person eating meals?: None ?Help from another person taking care of personal grooming?: A Little ?Help from another person toileting, which includes using toliet, bedpan, or urinal?: A Little ?Help from another person bathing (including washing, rinsing, drying)?: A Little ?Help from another person to put on and taking off regular upper body clothing?: A  Little ?Help from another person to put on and taking off regular lower body clothing?: A Little ?6 Click Score: 19 ?  ?End of Session Nurse Communication: Mobility status ? ?Activity Tolerance: Patient tolerated treatment well ?Patient left: in chair;with call bell/phone within reach;with chair alarm set ? ?OT Visit Diagnosis: Unsteadiness on feet (R26.81);Other abnormalities of gait and mobility (R26.89);Muscle weakness (generalized) (M62.81)  ?              ?Time: 1950-9326 ?OT Time Calculation (min): 23 min ?Charges:  OT General Charges ?$OT Visit: 1 Visit ?OT Evaluation ?$OT Eval Moderate Complexity: 1 Mod ?OT Treatments ?$Self Care/Home Management : 8-22 mins ? ?Ethon Wymer MSOT, OTR/L ?Acute Rehab ?Pager: [phone] ?Office: [phone] ? ?Teancum Brule M Virgie Kunda ?09/23/2021, 1:37 PM ?

## 2021-09-24 DIAGNOSIS — N179 Acute kidney failure, unspecified: Secondary | ICD-10-CM | POA: Diagnosis not present

## 2021-09-24 DIAGNOSIS — E785 Hyperlipidemia, unspecified: Secondary | ICD-10-CM | POA: Diagnosis not present

## 2021-09-24 DIAGNOSIS — D649 Anemia, unspecified: Secondary | ICD-10-CM | POA: Diagnosis not present

## 2021-09-24 DIAGNOSIS — I5043 Acute on chronic combined systolic (congestive) and diastolic (congestive) heart failure: Secondary | ICD-10-CM | POA: Diagnosis not present

## 2021-09-24 LAB — BASIC METABOLIC PANEL
Anion gap: 7 (ref 5–15)
BUN: 7 mg/dL — ABNORMAL LOW (ref 8–23)
CO2: 28 mmol/L (ref 22–32)
Calcium: 7.7 mg/dL — ABNORMAL LOW (ref 8.9–10.3)
Chloride: 105 mmol/L (ref 98–111)
Creatinine, Ser: 1 mg/dL (ref 0.61–1.24)
GFR, Estimated: >60 mL/min (ref >60)
Glucose, Bld: 91 mg/dL (ref 70–99)
Potassium: 3.4 mmol/L — ABNORMAL LOW (ref 3.5–5.1)
Sodium: 140 mmol/L (ref 135–145)

## 2021-09-24 LAB — MAGNESIUM: Magnesium: 1.5 mg/dL — ABNORMAL LOW (ref 1.7–2.4)

## 2021-09-24 MED ORDER — ATORVASTATIN CALCIUM 40 MG PO TABS: 40.0000 mg | ORAL_TABLET | Freq: Every evening | ORAL | 0 refills | Status: DC

## 2021-09-24 MED ORDER — POTASSIUM CHLORIDE CRYS ER 20 MEQ PO TBCR: 20.0000 meq | EXTENDED_RELEASE_TABLET | Freq: Every day | ORAL | 0 refills | Status: DC

## 2021-09-24 MED ORDER — SPIRONOLACTONE 25 MG PO TABS: 25.0000 mg | ORAL_TABLET | Freq: Every day | ORAL | 0 refills | Status: DC

## 2021-09-24 MED ORDER — CARVEDILOL 3.125 MG PO TABS: 3.1250 mg | ORAL_TABLET | Freq: Two times a day (BID) | ORAL | 0 refills | Status: DC

## 2021-09-24 MED ORDER — ASPIRIN 81 MG PO TBEC: 81.0000 mg | DELAYED_RELEASE_TABLET | Freq: Every day | ORAL | 0 refills | Status: DC

## 2021-09-24 MED ORDER — POTASSIUM CHLORIDE CRYS ER 20 MEQ PO TBCR
40.0000 meq | EXTENDED_RELEASE_TABLET | ORAL | Status: DC
  Administered 2021-09-24: 40 meq via ORAL
  Filled 2021-09-24: qty 2

## 2021-09-24 MED ORDER — DIGOXIN 125 MCG PO TABS: 0.1250 mg | ORAL_TABLET | Freq: Every day | ORAL | 0 refills | Status: DC

## 2021-09-24 MED ORDER — DAPAGLIFLOZIN PROPANEDIOL 10 MG PO TABS: 10.0000 mg | ORAL_TABLET | Freq: Every day | ORAL | 0 refills | Status: DC

## 2021-09-24 MED ORDER — ENTRESTO 49-51 MG PO TABS: 1.0000 | ORAL_TABLET | Freq: Two times a day (BID) | ORAL | 0 refills | Status: AC

## 2021-09-24 MED ORDER — MAGNESIUM SULFATE 4 GM/100ML IV SOLN
4.0000 g | Freq: Once | INTRAVENOUS | Status: AC
  Administered 2021-09-24: 4 g via INTRAVENOUS
  Filled 2021-09-24: qty 100

## 2021-09-24 MED ORDER — ENSURE ENLIVE PO LIQD: 237.0000 mL | Freq: Three times a day (TID) | ORAL | 0 refills | Status: AC

## 2021-09-24 NOTE — Discharge Summary (Signed)
?Physician Discharge Summary ?  ?Patient: Jeremy Powers MRN: [id number] DOB: [birth year]  ?Admit date:     09/20/2021  ?Discharge date: 09/24/21  ?Discharge Physician: Jeremy Powers  ? ?PCP: Jeremy Richters, PA-C  ? ?Recommendations at discharge:  ? ? Patient has been advised to be compliant with his medications, a refill for all his cardiac medications has been sent to his pharmacy.  ?Plan to follow up as outpatient in 7 to 10 days ?Follow up renal function and electrolytes in 7 days.  ? ?Discharge Diagnoses: ?Principal Problem: ?  Acute on chronic heart failure (HCC) ?Active Problems: ?  AKI (acute kidney injury) (HCC) ?  CAD (coronary artery disease) ?  Essential hypertension ?  Dyslipidemia ?  Anemia ?  Malnutrition of moderate degree ? ?Resolved Problems: ?  * No resolved hospital problems. * ? ?Hospital Course: ?Jeremy Powers was admitted to the hospital with the working diagnosis of decompensated heart failure ? ?66 yo male with the past medical history of systolic heart failure, coronary artery disease, and hypertension who presented with lower extremity edema and dyspnea. Worsening symptoms for several weeks. Suspected patient non compliant with his medications at home. Frequent hospitalizations for heart failure. On his initial physical examination his blood pressure was 131/72, HR 88, RR 18 02 saturation 97%, lungs with bilateral rhonchi, heart with S1 and S2 present and rhythmic, abdomen soft, positive lower extremity edema, pitting ++++.  ? ?Na 139, K 3,5 Cl 104, bicarbonate 23, glucose 84 bun 6 cr 0,96  ?BNP >4,500  ?Wbc 5,0 hgb 12,2 plt 188  ? ?Chest radiograph with cardiomegaly with mild hilar vascular congestion. ? ?EKG 92 bom, left axis deviation, left anterior fascicular block, qtc 629, sinus rhythm with poor R wave progression, no significant ST segment or T wave changes, positive LVH.  ? ?Placed on furosemide for diuresis with improvement in volume status. ?Plan to follow up as outpatient, he  was advised about compliance with his medications.  ? ?Assessment and Plan: ?* Acute on chronic heart failure (HCC) ?Acute on chronic systolic heart failure.  ? ?Patient was admitted to the cardiac ward and received aggressive diuresis with IV furosemide, negative fluid balance was achieved with improvement in his symptoms.  ? ?Echocardiogram from 04/2021 with LV 20 to 25% ejection fraction, global hypokinesis, moderate reduction in RV systolic function, RVSP 43,2 mmHg. Moderate mitral valve regurgitation.  ? ?Patient has been advised about compliance with his medications.  ? ?Continue heart failure therapy with spironolactone, dapagliflozin, carvedilol and digoxin.  ?Compression stockings.  ? ? ?AKI (acute kidney injury) (HCC) ?Hypokalemia, hypomagnesemia.  ? ?Patient responded well to diuretic therapy, at the time of his discharge renal function with serum cr at 1.0 with K at 3,4 and serum bicarbonate 28. Mg 1,5 ? ?Patient will receive 40 meq Kcl x2 and 4 g mag sulfate before discharge. ?Follow up renal function as outpatient.  ?Continue with furosemide and spironolactone.  ? ?CAD (coronary artery disease) ?S/p CABG ?No acute chest pain at this time ?Continue aspirin and statin ? ?Essential hypertension ?Blood pressure controlled continue with spironolactone, carvedilol and Entresto.  ?Continue with dapagliflozin and furosemide.  ? ?Dyslipidemia ?Continue with atorvastatin.  ? ?Anemia ?Continue with iron supplementation.  ? ?Malnutrition of moderate degree ?Continue with nutritional supplements.  ? ? ? ? ?  ? ? ?Consultants: none  ?Procedures performed:  none   ?Disposition: Home ?Diet recommendation:  ?Cardiac diet ?DISCHARGE MEDICATION: ?Allergies as of 09/24/2021   ?No Known Allergies ?  ? ?  ?  Medication List  ?  ? ?TAKE these medications   ? ?aspirin 81 MG EC tablet ?Take 1 tablet (81 mg total) by mouth daily. Swallow whole. ?  ?atorvastatin 40 MG tablet ?Commonly known as: LIPITOR ?Take 1 tablet (40 mg total)  by mouth every evening. ?  ?carvedilol 3.125 MG tablet ?Commonly known as: COREG ?Take 1 tablet (3.125 mg total) by mouth 2 (two) times daily. ?  ?dapagliflozin propanediol 10 MG Tabs tablet ?Commonly known as: [place] ?Take 1 tablet (10 mg total) by mouth daily. ?  ?digoxin 0.125 MG tablet ?Commonly known as: LANOXIN ?Take 1 tablet (0.125 mg total) by mouth daily. ?  ?Entresto 49-51 MG ?Generic drug: sacubitril-valsartan ?Take 1 tablet by mouth 2 (two) times daily. ?  ?feeding supplement Liqd ?Take 237 mLs by mouth 3 (three) times daily between meals. ?  ?ferrous sulfate 325 (65 FE) MG tablet ?TAKE ONE TABLET BY MOUTH TWO TIMES A DAY WITH MEALS ?What changed: See the new instructions. ?  ?furosemide 40 MG tablet ?Commonly known as: LASIX ?Take 1 tablet (40 mg total) by mouth daily. ?What changed: when to take this ?  ?potassium chloride SA 20 MEQ tablet ?Commonly known as: KLOR-CON M ?Take 1 tablet (20 mEq total) by mouth daily. ?  ?sertraline 25 MG tablet ?Commonly known as: Zoloft ?Take 1 tablet (25 mg total) by mouth daily. ?  ?spironolactone 25 MG tablet ?Commonly known as: ALDACTONE ?Take 1 tablet (25 mg total) by mouth at bedtime. ?  ? ?  ? ? Follow-up Information   ? ? Powers, Jeremy Dredge, PA-C. Go on 09/25/2021.   ?Specialties: Internal Medicine, Family Medicine ?Why: @10 :40am ?Contact information: ?[address] ?[address] ?[place] [address] ?[phone] ? ? ?  ?  ? ? Powers, Jeremy Buckles, MD .   ?Specialty: Cardiology ?Contact information: ?[address] ?[address] ?[place] [address] ?[phone] ? ? ?  ?  ? ?  ?  ? ?  ? ?Discharge Exam: ?Filed Weights  ? 09/22/21 0503 09/23/21 1561 09/24/21 0645  ?Weight: 63.7 kg 62.9 kg 62.9 kg  ? ?BP (!) 109/52 (BP Location: Right Arm)   Pulse 68   Temp 98.5 ?F (36.9 ?C) (Oral)   Resp 16   Ht 5\' 7"  (1.702 m)   Wt 62.9 kg   SpO2 97%   BMI 21.72 kg/m?  ? ?Patient is feeling better, no dyspnea and edema has resolved. ? ?Neurology awake and alert ?ENT  With no pallor ?Cardiovascular with S1 and S2 present and rhythmic, with no gallops or murmurs ?No JVD ?No lower extremity edema ?Respiratory with no wheezing or rhonchi ?Abdomen not distended.  ? ?Condition at discharge: stable ? ?The results of significant diagnostics from this hospitalization (including imaging, microbiology, ancillary and laboratory) are listed below for reference.  ? ?Imaging Studies: ?DG Chest 2 View ? ?Result Date: 09/20/2021 ?CLINICAL DATA:  Shortness of breath EXAM: CHEST - 2 VIEW COMPARISON:  Chest x-ray dated June 02, 2021 FINDINGS: Unchanged cardiomegaly. Prior median sternotomy. Mild linear opacities of the left lung which are likely due to atelectasis. Lungs otherwise clear. No pleural effusion or pneumothorax. IMPRESSION: Stable cardiomegaly.  No active cardiopulmonary disease. Electronically Signed   By: Allegra Lai M.D.   On: 09/20/2021 16:41   ? ?Microbiology: ?Results for orders placed or performed during the hospital encounter of 05/06/21  ?Resp Panel by RT-PCR (Flu A&B, Covid) Nasopharyngeal Swab     Status: None  ? Collection Time: 05/06/21  11:13 PM  ? Specimen: Nasopharyngeal Swab; Nasopharyngeal(NP) swabs in vial transport medium  ?Result Value Ref Range Status  ? SARS Coronavirus 2 by RT PCR NEGATIVE NEGATIVE Final  ?  Comment: (NOTE) ?SARS-CoV-2 target nucleic acids are NOT DETECTED. ? ?The SARS-CoV-2 RNA is generally detectable in upper respiratory ?specimens during the acute phase of infection. The lowest ?concentration of SARS-CoV-2 viral copies this assay can detect is ?138 copies/mL. A negative result does not preclude SARS-Cov-2 ?infection and should not be used as the sole basis for treatment or ?other patient management decisions. A negative result may occur with  ?improper specimen collection/handling, submission of specimen other ?than nasopharyngeal swab, presence of viral mutation(s) within the ?areas targeted by this assay, and inadequate number of  viral ?copies(<138 copies/mL). A negative result must be combined with ?clinical observations, patient history, and epidemiological ?information. The expected result is Negative. ? ?Fact Sheet for Patients:  ?https

## 2021-09-24 NOTE — TOC Transition Note (Signed)
Transition of Care (TOC) - CM/SW Discharge Note ? ? ?Patient Details  ?Name: Jeremy Powers ?MRN: [id number] ?Date of Birth: [birth year] ? ?Transition of Care (TOC) CM/SW Contact:  ?Carles Collet, RN ?Phone Number: ?09/24/2021, 9:54 AM ? ? ?Clinical Narrative:    ?Spoke w patient and he is agreeable to [facility] PT he does not have a preference for provider, Alvis Lemmings accepted referral. He states that he has a walker at home and will have transportation ? ? ? ?Final next level of care: [place] ?Barriers to Discharge: No Barriers Identified ? ? ?Patient Goals and CMS Choice ?Patient states their goals for this hospitalization and ongoing recovery are:: to go home ?CMS Medicare.gov Compare Post Acute Care list provided to:: Patient ?Choice offered to / list presented to : Patient ? ?Discharge Placement ?  ?           ?  ?  ?  ?  ? ?Discharge Plan and Services ?  ?  ?           ?  ?  ?  ?  ?  ?HH Arranged: PT ?[place] Agency: [place] ?Date HH Agency Contacted: 09/24/21 ?Time [place]: [phone] ?Representative spoke with at [place]: Tommi Rumps ? ?Social Determinants of Health (SDOH) Interventions ?  ? ? ?Readmission Risk Interventions ?   ? View : No data to display.  ?  ?  ?  ? ? ? ? ? ?

## 2021-09-25 ENCOUNTER — Inpatient Hospital Stay: Payer: Medicare Other | Admitting: Medical

## 2021-09-25 ENCOUNTER — Other Ambulatory Visit: Payer: Self-pay

## 2021-09-27 ENCOUNTER — Telehealth (HOSPITAL_COMMUNITY): Payer: Self-pay | Admitting: Licensed Clinical Social Worker

## 2021-09-27 NOTE — Telephone Encounter (Signed)
Pt referred to paramedicine due to recent admission/noncompliance with medications.  Referral completed and sent to paramedics for assignment. ? ?Burna Sis, LCSW ?Clinical Social Worker ?[facility] ?Desk#: [phone] ?Cell#: [phone] ? ?

## 2021-10-06 NOTE — Chronic Care Management (AMB) (Signed)
  Care Management   Outreach Note  10/06/2021 Name: Jeremy Powers MRN: [id number] DOB: July 01, 1955  Referred by: [name], PA-C Reason for referral : Care Coordination (Outreach to schedule initial with RNCM (HR list) )   A second unsuccessful telephone outreach was attempted today. The patient was referred to the case management team for assistance with care management and care coordination.   Follow Up Plan:  A HIPAA compliant phone message was left for the patient providing contact information and requesting a return call.   Burman Nieves, CCMA Care Guide, Embedded Care Coordination [facility]  Care Management  Direct Dial: [phone]

## 2021-10-12 ENCOUNTER — Telehealth: Payer: Self-pay | Admitting: Medical

## 2021-10-12 ENCOUNTER — Encounter (HOSPITAL_COMMUNITY): Payer: Medicare Other | Admitting: Internal Medicine

## 2021-10-12 NOTE — Telephone Encounter (Signed)
Left message for patient to call back and schedule Medicare Annual Wellness Visit (AWV).   Please offer to do virtually or by telephone.  Left office number and my jabber #[phone].  AWVI eligible as of 12/20/2019  Please schedule at anytime with Nurse Health Advisor.   

## 2021-10-13 ENCOUNTER — Telehealth (HOSPITAL_COMMUNITY): Payer: Self-pay | Admitting: Licensed Clinical Social Worker

## 2021-10-13 ENCOUNTER — Telehealth (HOSPITAL_COMMUNITY): Payer: Self-pay

## 2021-10-13 DIAGNOSIS — I5022 Chronic systolic (congestive) heart failure: Secondary | ICD-10-CM

## 2021-10-13 NOTE — Telephone Encounter (Signed)
CSW received call from staff with pt PCP office trying to help pt manage medications- pt there and provided verbal consent for CSW to confirm medications.  Pt missed MD appt yesterday so CSW had clinic staff reschedule him for next week  Pt has trouble with transportation so sent in order to [facility] to reach out to pt regarding transportation assistance  Will continue to follow and assist as needed  Burna Sis, LCSW Clinical Social Worker [facility] Desk#: [phone] Cell#: [phone]

## 2021-10-13 NOTE — Telephone Encounter (Signed)
Called to confirm/remind patient of their appointment at the [facility] on 10/17/21.   Patient reminded to bring all medications and/or complete list.  Confirmed patient has transportation. Gave directions, instructed to utilize valet parking.  Confirmed appointment prior to ending call.   

## 2021-10-17 ENCOUNTER — Encounter (HOSPITAL_COMMUNITY): Payer: Self-pay

## 2021-10-17 ENCOUNTER — Telehealth: Payer: Self-pay

## 2021-10-17 ENCOUNTER — Ambulatory Visit (HOSPITAL_COMMUNITY)
Admission: RE | Admit: 2021-10-17 | Discharge: 2021-10-17 | Disposition: A | Discharge status: Home / Self Care | Payer: Medicare Other | Source: Ambulatory Visit | Attending: Family Medicine | Admitting: Family Medicine

## 2021-10-17 ENCOUNTER — Other Ambulatory Visit (HOSPITAL_COMMUNITY): Payer: Self-pay | Admitting: Emergency Medicine

## 2021-10-17 VITALS — BP 120/62 | HR 77 | Wt 133.2 lb

## 2021-10-17 DIAGNOSIS — I447 Left bundle-branch block, unspecified: Secondary | ICD-10-CM | POA: Insufficient documentation

## 2021-10-17 DIAGNOSIS — I110 Hypertensive heart disease with heart failure: Secondary | ICD-10-CM | POA: Diagnosis not present

## 2021-10-17 DIAGNOSIS — Z7982 Long term (current) use of aspirin: Secondary | ICD-10-CM | POA: Insufficient documentation

## 2021-10-17 DIAGNOSIS — I2510 Atherosclerotic heart disease of native coronary artery without angina pectoris: Secondary | ICD-10-CM | POA: Diagnosis not present

## 2021-10-17 DIAGNOSIS — I428 Other cardiomyopathies: Secondary | ICD-10-CM | POA: Diagnosis not present

## 2021-10-17 DIAGNOSIS — I5022 Chronic systolic (congestive) heart failure: Secondary | ICD-10-CM | POA: Diagnosis present

## 2021-10-17 DIAGNOSIS — I340 Nonrheumatic mitral (valve) insufficiency: Secondary | ICD-10-CM

## 2021-10-17 DIAGNOSIS — Z8616 Personal history of COVID-19: Secondary | ICD-10-CM | POA: Diagnosis not present

## 2021-10-17 DIAGNOSIS — Z550 Illiteracy and low-level literacy: Secondary | ICD-10-CM | POA: Insufficient documentation

## 2021-10-17 DIAGNOSIS — D509 Iron deficiency anemia, unspecified: Secondary | ICD-10-CM | POA: Diagnosis not present

## 2021-10-17 DIAGNOSIS — Z951 Presence of aortocoronary bypass graft: Secondary | ICD-10-CM | POA: Insufficient documentation

## 2021-10-17 DIAGNOSIS — Z79899 Other long term (current) drug therapy: Secondary | ICD-10-CM | POA: Insufficient documentation

## 2021-10-17 DIAGNOSIS — F1290 Cannabis use, unspecified, uncomplicated: Secondary | ICD-10-CM | POA: Insufficient documentation

## 2021-10-17 DIAGNOSIS — F1090 Alcohol use, unspecified, uncomplicated: Secondary | ICD-10-CM | POA: Insufficient documentation

## 2021-10-17 DIAGNOSIS — I5082 Biventricular heart failure: Secondary | ICD-10-CM | POA: Insufficient documentation

## 2021-10-17 DIAGNOSIS — Z87891 Personal history of nicotine dependence: Secondary | ICD-10-CM | POA: Diagnosis not present

## 2021-10-17 DIAGNOSIS — I10 Essential (primary) hypertension: Secondary | ICD-10-CM

## 2021-10-17 LAB — BASIC METABOLIC PANEL
Anion gap: 7 (ref 5–15)
BUN: 9 mg/dL (ref 8–23)
CO2: 24 mmol/L (ref 22–32)
Calcium: 7.9 mg/dL — ABNORMAL LOW (ref 8.9–10.3)
Chloride: 107 mmol/L (ref 98–111)
Creatinine, Ser: 0.67 mg/dL (ref 0.61–1.24)
GFR, Estimated: >60 mL/min (ref >60)
Glucose, Bld: 82 mg/dL (ref 70–99)
Potassium: 3.8 mmol/L (ref 3.5–5.1)
Sodium: 138 mmol/L (ref 135–145)

## 2021-10-17 LAB — BRAIN NATRIURETIC PEPTIDE: B Natriuretic Peptide: 2393.4 pg/mL — ABNORMAL HIGH (ref 0.0–100.0)

## 2021-10-17 LAB — DIGOXIN LEVEL: Digoxin Level: <0.2 ng/mL — ABNORMAL LOW (ref 0.8–2.0)

## 2021-10-17 NOTE — Progress Notes (Signed)
Heart and Vascular Care Navigation  10/17/2021  Jeremy Powers [birth year] [id number]  Reason for Referral: food insecurity   Engaged with patient face to face for follow up visit for Heart and Vascular Care Coordination.                                                                                                   Assessment:     CSW informed by [employer] that pt reports he does not have access to food for this evening.  Pt reports he gets food stamps but only $100 which does not last him.  Reports he often is out of food.  Does not access food pantries as he feels embarrassed to do so- discussed this with pt and encouraged him to consider this option to help him long term with food insecurity.  Reports he has accessed free meal options in the past but just not food pantries.  Reports he has someone who can take him to the store and also has people who can cook for him when he has food.  CSW able to give pt $50 in giftcards to help with getting food for this evening.  Also provided pt with list of local pantries for him to access.                                 HRT/VAS Care Coordination     Patients Home Cardiology Office [facility] Clinic   Outpatient Care Team Community Paramedicine   Community Paramedic Name: [facility] 09/04/21 for lack of communication   Social Worker Name: Rosetta Posner- [facility]- [phone]   Living arrangements for the past 2 months Apartment   Lives with: Self   Home Assistive Devices/Equipment None   [facility]       Social History:                                                                             SDOH Screenings   Alcohol Screen: Not on file  Depression (PHQ2-9): Not on file  Financial Resource Strain: Not on file  Food Insecurity: Food Insecurity Present   Worried About Programme researcher, broadcasting/film/video in the Last Year: Often true   Barista in the Last Year: Sometimes true  Housing: Low Risk    Last  Housing Risk Score: 0  Physical Activity: Not on file  Social Connections: Not on file  Stress: Not on file  Tobacco Use: Medium Risk   Smoking Tobacco Use: Former   Smokeless Tobacco Use: Never   Passive Exposure: Not on file  Transportation Needs: No Transportation Needs   Lack of Transportation (Medical): No   Lack of Transportation (Non-Medical): No  SDOH Interventions: Financial Resources:    Not assessed  Food Insecurity:  Food Insecurity Interventions: Other (Comment) (food pantry referral and gift cards)  Housing Insecurity:  Not assessed  Transportation:   Referred to [facility]   Follow-up plan:    Paramedic to continue to follow pt and assist as needed  Burna Sis, LCSW Clinical Social Worker [facility] Desk#: [phone] Cell#: [phone]

## 2021-10-17 NOTE — Telephone Encounter (Signed)
   Telephone encounter was:  Unsuccessful.  10/17/2021 Name: Jeremy Powers MRN: [id number] DOB: 1955/12/21  Unsuccessful outbound call made today to assist with:  Transportation Needs   Outreach Attempt:  2nd Attempt  A HIPAA compliant voice message was left requesting a return call.  Instructed patient to call back at [phone] at their earliest convenience.  CG left voicemail for daughter as well.  [facility] Guide, Embedded Care Coordination [facility]  [place], [place] [address]  Main Phone: [phone]  E-mail: [email or web address]  Website: [email or web address]

## 2021-10-17 NOTE — Progress Notes (Signed)
Advanced Heart Failure Clinic Note  Date:  10/17/2021   PCP:  [name], PA-C  Cardiologist:  Arvilla Meres, MD Primary HF: Dr. [name]  Chief Complaint: HF Follow up   History of Present Illness: Jeremy Powers is a 66 y.o.male CAD s/p PCI to LAD in 2015 and 2016, s/p CABG 2017, systolic HF EF 20-25%, microcytic/iron deficiency anemia followed by hematology and GI, LBBB, and tobacco use. Illiterate and cannot read.   In 2/20 he presented to [facility] ED with SOB, cough, and orthopnea in setting of med non-compliance. Was unable to afford copays with Medicaid. He was tachycardic and tachypneic on arrival, and was transferred to [facility] 06/22/18 for further evaluation. Echo showed newly decreased EF of 25-30% from 50-55% in 12/2017.  Underwent R/LHC showing severe 1 v disease with occluded LAD within previous stent. RCA and LCX normal. LIMA to LAD and SVG to D2 patent. Cath also showed low filling pressures with moderately low cardiac output. Meds adjusted as tolerated.   OV 04/05/20 returns for HF follow up after not being seen in > 1 year. Followed by HF Paramedicine. Lasix was stopped & [name] was started.  Echo 2/22 LVEF 20-25%, RV ok.    3/22 he was discharged from community paramedicine program and bubble pill packs arranged.   Seen in the ED 10/25/20 for CHF exacerbation in the setting of medication non-compliance. He was diuresed with IV lasix and discharged home. Sent to the ED 12/23/20 by his PCP and found to be A/C CHF and COVID +. Repeat echo EF 10-15%. Diuresed 13 lbs, beta blocker stopped with acute decompensation, digoxin added, and Entresto held with low BP.  Admitted 12/22 with A/C CHF after stopping his medications a month prior. Echo showed EF 20-25%. He was diuresed with IV lasix and GDMT restarted.   Out of HF meds at follow up, volume up. Meds restarted and paramedicine arranged.  Follow up 1/23, NYHA II, volume ok. Entresto increased.  Admitted 5/23 with a/c CHF,  due to non-compliance with medications. Diuresed with IV lasix. Hospitalization complicated by AKI. GDMT restarted after SCr improved. Discharged home, weight 138 lbs.  Today he returns for post hospital HF follow up. Overall feeling fine. He has SOB after walking 2.5 blocks outside. Denies palpitations, CP, dizziness, edema, or PND/Orthopnea. Appetite ok. No fever or chills. He is not weighing at home. Drinking ~1 beer a day. He does not know what medications he is taking. Lives with his girlfriend. Does not smoke. Says he has been taking all of his meds since discharge.   Cardiac Studies: - Echo (12/22): EF 20-25%, global HK, grade II DD, RV moderately reduced, moderate MR  - Echo (8/22): EF 10-15%, Repeat echo showed EF 10-15%, severe global hypokinesis with septal akinesis, LV severely dilated, grade II DD, RV fxn moderate to severely reduced, moderate to severe MR, moderate to severe AI.   - Echo (2/22): EF 20-25%, RV ok   - Echo (9/20): EF 20-25%   - Echo (2/20): EF 20-25%   - cMRI (06/27/18): EF 24%. Normal RV   - R/LHC (2/20) RA = 1 RV = 19/3 PA = 24/5 (14) PCW = 7 Fick cardiac output/index = 4.0/2.5 PVR = 1.9 WU Ao sat = 95% PA sat = 68%, 71%   Assessment: 1. Severe 1-vessel CAD with occluded LAD within previous stent 2. Normal RCA and LCX 3. LIMA to LAD widely patent 4. SVG - D2 widely patent 5. SVG -  D1 likely occluded (grafts not marked)  ROS: All systems negative except as listed in HPI, PMH and Problem List.  Past Medical History:  Diagnosis Date   CHF (congestive heart failure) (HCC)    Cirrhosis (HCC)    Coronary artery disease    Hypertension    Pancreatitis    Past Surgical History:  Procedure Laterality Date   CARDIAC SURGERY     RIGHT/LEFT HEART CATH AND CORONARY/GRAFT ANGIOGRAPHY N/A 06/24/2018   Procedure: RIGHT/LEFT HEART CATH AND CORONARY/GRAFT ANGIOGRAPHY;  Surgeon: Dolores Patty, MD;  [facility] INV[facility] CV [facility];  Service: Cardiovascular;   Laterality: N/A;   Current Outpatient Medications  Medication Sig Dispense Refill   aspirin 81 MG EC tablet Take 1 tablet (81 mg total) by mouth daily. Swallow whole. 30 tablet 0   atorvastatin (LIPITOR) 40 MG tablet Take 1 tablet (40 mg total) by mouth every evening. 30 tablet 0   carvedilol (COREG) 3.125 MG tablet Take 1 tablet (3.125 mg total) by mouth 2 (two) times daily. 60 tablet 0   dapagliflozin propanediol (FARXIGA) 10 MG TABS tablet Take 1 tablet (10 mg total) by mouth daily. 30 tablet 0   digoxin (LANOXIN) 0.125 MG tablet Take 1 tablet (0.125 mg total) by mouth daily. 30 tablet 0   feeding supplement (ENSURE ENLIVE / ENSURE PLUS) LIQD Take 237 mLs by mouth 3 (three) times daily between meals. 21330 mL 0   ferrous sulfate 325 (65 FE) MG tablet TAKE ONE TABLET BY MOUTH TWO TIMES A DAY WITH MEALS (Patient taking differently: 325 mg 2 (two) times daily with a meal.) 180 tablet 7   furosemide (LASIX) 40 MG tablet Take 1 tablet (40 mg total) by mouth daily. (Patient taking differently: Take 40 mg by mouth every evening.) 90 tablet 3   potassium chloride SA (KLOR-CON M) 20 MEQ tablet Take 1 tablet (20 mEq total) by mouth daily. 30 tablet 0   sacubitril-valsartan (ENTRESTO) 49-51 MG Take 1 tablet by mouth 2 (two) times daily. 60 tablet 0   sertraline (ZOLOFT) 25 MG tablet Take 1 tablet (25 mg total) by mouth daily. 30 tablet 5   spironolactone (ALDACTONE) 25 MG tablet Take 1 tablet (25 mg total) by mouth at bedtime. 30 tablet 0   No current facility-administered medications for this encounter.   Allergies:   Patient has no known allergies.   Social History:  The patient  reports that he has quit smoking. His smoking use included cigarettes. He smoked an average of .5 packs per day. He has never used smokeless tobacco. He reports current alcohol use. He reports current drug use. Drug: Marijuana.   Family History:  The patient's family history is not on file.   ROS:  Please see the history  of present illness.   All other systems are personally reviewed and negative.   Recent Labs: 06/02/2021: ALT 11; Pro B Natriuretic peptide (BNP) 2,394.0 09/20/2021: B Natriuretic Peptide >4,500.0; Hemoglobin 12.2; Platelets 188 09/24/2021: BUN 7; Creatinine, Ser 1.00; Magnesium 1.5; Potassium 3.4; Sodium 140  Personally reviewed   BP 120/62   Pulse 77   Wt 60.4 kg (133 lb 3.2 oz)   SpO2 97%   BMI 20.86 kg/m   Wt Readings from Last 3 Encounters:  10/17/21 60.4 kg (133 lb 3.2 oz)  09/24/21 62.9 kg (138 lb 10.7 oz)  06/19/21 60.3 kg (133 lb)   Physical Exam: General:  NAD. No resp difficulty, thin HEENT: Normal Neck: Supple. JVP 6-7 Carotids 2+ bilat;  no bruits. No lymphadenopathy or thryomegaly appreciated. Cor: PMI nondisplaced. Regular rate & rhythm. No rubs, gallops, 2/6 MR Lungs: Clear Abdomen: Soft, nontender, nondistended. No hepatosplenomegaly. No bruits or masses. Good bowel sounds. Extremities: No cyanosis, clubbing, rash, edema Neuro: Alert & oriented x 3, cranial nerves grossly intact. Moves all 4 extremities w/o difficulty. Affect pleasant.  ECG (personally reviewed): NSR LBBB, 78 bpm, QRS 192 msec  REDs: 30%  ASSESSMENT AND PLAN: 1.  Chronic biventricular heart failure, NICM - Echo (2/20): EF 25-30%,   - cMRI (2/20) with EF 24% - Echo (9/20): EF 20-25% - Echo (2/22): EF 20-25% RV ok - Echo (8/22): EF 10-15%, moderately to severely reduced RV function, moderate to severe MR, moderate to severe AI. - Echo (12/22): EF 20-25%, global HK, grade II DD, RV moderately reduced, moderate MR - Previously referred to EP for consideration of CRT-D with wide LBBB but didn't follow up. Likely too advanced.  - Stable NYHA II, volume looks good today, REDs 30%. - Continue Entresto 49/51 mg bid. Plan to increase next. - Continue Lasix 40 mg daily + 20 KCL daily. - Continue digoxin 0.125 mg daily.  - Continue carvedilol 3.125 mg bid. - Continue Farxiga 10 mg daily. - Continue  spiro 25 mg daily. - He is not a candidate for advanced therapies. - Re-enroll in paramedicine. No med changes today. - BMET, dig level and BNP today.   2. CAD - S/P CABG 2017, LHC 06/2018  with occluded LAD normal LCX and RCA. LIMA to LAD and SVG to D2 patent (SVG to D1 occluded) - No s/s angina.  - Continue aspirin 81 mg daily + atorvastatin.      3. LBBB - Previously referred to EP for consideration of CRT-D with wide LBBB but didn't follow up. Likely too advanced currently to benefit.   4. Valvular heart disease - Moderate MR and mild AI on echo 12/22. LVIDd 7.1 cm - Will follow.   5. h/o tobacco use - No longer smoking.  - Lives with people who do smoke.  6. ETOH use - Discussed cutting back/stopping.   7. HTN - Stable. - GDMT as above   8. Anemia - Iron deficient. On oral Fe. - Followed by PCP.  9. Illiterate/SDOH - Cannot read. Able to write numbers. Needs assistance with medications.    - HFSW helping with re-enrolling w/ paramedicine.  Follow up in 3 weeks with APP and 3 months with Dr. [name].  Cydney Ok, FNP  10/17/2021 2:59 PM  Advanced Heart Clinic [facility] [address] Heart and Vascular [place] [address] [phone] (office) [phone] (fax)

## 2021-10-17 NOTE — Progress Notes (Signed)
Paramedicine Encounter   Patient ID: CHALLEN SPAINHOUR , male,   DOB: 05/22/1955,65 y.o.,  MRN: [id number]   Met patient in clinic today with provider.  Time spent with patient 1 hr  Met with Mr. Spikes today for first time.  Mr. Freimark is a pleasant 66 y.o. male.  He has not complaints today.  He did get a REDS clip reading and was at 30%.  No med changes.  We discussed his ability to get food.  He states he only eats like once a day because he doesn't have much money.  Discussed with him Food [employer] resources and [place] gave him a couple of gift cards to buy food with.  He was reluctant to accept help saying, "I'm just not that kind of person."  Explained there are multiple resources and he shouldn't be too proud to accept help.  He did not have his medications with him today in the clinic. I scheduled another visit at his home next [facility] 09/24/21 @ 9:00.   [name], [place] 10/17/2021

## 2021-10-17 NOTE — Patient Instructions (Signed)
Thank you for coming in today  Labs were done today, if any labs are abnormal the clinic will call you No news is good news  Your physician recommends that you schedule a follow-up appointment in:  3 weeks in clinic 3 months with Dr. Haroldine Laws  At the [facility], you and your health needs are our priority. As part of our continuing mission to provide you with exceptional heart care, we have created designated Provider Care Teams. These Care Teams include your primary Cardiologist (physician) and Advanced Practice Providers (APPs- Physician Assistants and Nurse Practitioners) who all work together to provide you with the care you need, when you need it.   You may see any of the following providers on your designated Care Team at your next follow up: Dr Glori Bickers Dr Haynes Kerns, NP Lyda Jester, [place] [facility] [place], [place] Audry Riles, PharmD   Please be sure to bring in all your medications bottles to every appointment.   If you have any questions or concerns before your next appointment please send [place] a message through [place] or call our office at [phone].    TO LEAVE A MESSAGE FOR THE NURSE SELECT OPTION 2, PLEASE LEAVE A MESSAGE INCLUDING: YOUR NAME DATE OF BIRTH CALL BACK NUMBER REASON FOR CALL**this is important as we prioritize the call backs  YOU WILL RECEIVE A CALL BACK THE SAME DAY AS LONG AS YOU CALL BEFORE 4:00 PM

## 2021-10-17 NOTE — Telephone Encounter (Signed)
   Telephone encounter was:  Unsuccessful.  10/17/2021 Name: Jeremy Powers MRN: [id number] DOB: [birth year]  Unsuccessful outbound call made today to assist with:  Transportation Needs   Outreach Attempt:  1st Attempt  A HIPAA compliant voice message was left requesting a return call.  Instructed patient to call back at [phone] at their earliest convenience.  [place] management  [place], [place]  Main Phone: [phone]  E-mail: [email or web address]  Website: [email or web address]

## 2021-10-17 NOTE — Telephone Encounter (Signed)
   Telephone encounter was:  Successful.  10/17/2021 Name: Jeremy Powers MRN: [id number] DOB: [birth year]  Jeremy Powers is a 66 y.o. year old male who is a primary care patient of Saguier, Kateri Mc . The community resource team was consulted for assistance with Transportation Needs   Care guide performed the following interventions: Patient provided with information about care guide support team and interviewed to confirm resource needs. CG arrived emergent transportation for pt for 5/30 appt with [employer] [facility]. CG will send out transportation information and applications in order to have an established vendor with [employer].CG provided [employer] pt's information and they will be sending him out an application directly and will advise him on their services.  Follow Up Plan:  Care guide will follow up with patient by phone over the next few days to ensure mail has been received. Until then , pt rides will go through [facility].  [facility] Guide, Embedded Care Coordination [facility]  [place], [place] [address]  Main Phone: [phone]  E-mail: [email or web address]  Website: [email or web address]

## 2021-10-17 NOTE — Progress Notes (Signed)
ReDS Vest / Clip - 10/17/21 1400       ReDS Vest / Clip   Station Marker C    Ruler Value 24    ReDS Value Range Low volume    ReDS Actual Value 30

## 2021-10-17 NOTE — Addendum Note (Signed)
Encounter addended by: Burna Sis, LCSW on: 10/17/2021 3:58 PM  Actions taken: Flowsheet accepted, Clinical Note Signed

## 2021-10-20 ENCOUNTER — Other Ambulatory Visit (HOSPITAL_COMMUNITY): Payer: Self-pay | Admitting: Internal Medicine

## 2021-10-20 NOTE — Chronic Care Management (AMB) (Signed)
  Care Management   Note  10/20/2021 Name: Jeremy Powers MRN: [id number] DOB: 06-19-1955  Jeremy Powers is a 66 y.o. year old male who is a primary care patient of Saguier, Iris Pert. I reached out to Jeremy Powers by phone today offer care coordination services.   [name] was given information about care management services today including:  Care management services include personalized support from designated clinical staff supervised by his physician, including individualized plan of care and coordination with other care providers 24/7 contact phone numbers for assistance for urgent and routine care needs. The patient may stop care management services at any time by phone call to the office staff.  Patient agreed to services and verbal consent obtained.   Follow up plan: Telephone appointment with care management team member scheduled for: 11/13/2021  Julian Hy, [place] Management  Direct Dial: [phone]

## 2021-10-25 ENCOUNTER — Other Ambulatory Visit (HOSPITAL_COMMUNITY): Payer: Self-pay

## 2021-10-25 ENCOUNTER — Other Ambulatory Visit (HOSPITAL_COMMUNITY): Payer: Self-pay | Admitting: Emergency Medicine

## 2021-10-25 MED ORDER — DIGOXIN 125 MCG PO TABS: 0.1250 mg | ORAL_TABLET | Freq: Every day | ORAL | 0 refills | Status: DC

## 2021-10-25 MED ORDER — SPIRONOLACTONE 25 MG PO TABS: 25.0000 mg | ORAL_TABLET | Freq: Every day | ORAL | 0 refills | Status: DC

## 2021-10-25 MED ORDER — CARVEDILOL 3.125 MG PO TABS: 3.1250 mg | ORAL_TABLET | Freq: Two times a day (BID) | ORAL | 0 refills | Status: DC

## 2021-10-25 MED ORDER — POTASSIUM CHLORIDE CRYS ER 20 MEQ PO TBCR
20.0000 meq | EXTENDED_RELEASE_TABLET | Freq: Every day | ORAL | 0 refills | Status: DC
Start: 2021-10-25 — End: 2021-11-01

## 2021-10-25 NOTE — Progress Notes (Signed)
Paramedicine Encounter    Patient ID: Jeremy Powers, male    DOB: Apr 26, 1956, 66 y.o.   MRN: [id number]  First home visit with with Jeremy Powers today. It is obvious he has not been taking his medications.  He had pill boxes that were last filled by Camelia Eng.  I emptied these and started over.  I contacted HF Triage to review meds and filled pill boxes for two weeks.  I will be visiting him again next week to review medication compliance.  Pt has a congested productive cough which he says he's had since last week.  He has obvious JVD and 2+ pitting edema to his lower extremities.  He denies chest pain or unusual SOB.  Discussed having [place] delay sending out his next group of meds because he has 5 & 6 bottles of each medication.  Next visit Wed. 11/01/21.  Inquired about his food insecurities and he stated he was good on food at the moment but he could use some cloths.  I will follow up with Tammy Sours for same.   BP 120/78 (BP Location: Right Arm, Patient Position: Sitting, Cuff Size: Normal)   Pulse 93   Resp 16   Wt 132 lb (59.9 kg)   SpO2 96%   BMI 20.67 kg/m  Weight yesterday-132.2 lb Last visit weight-133lb  Patient Care Team: Saguier, Iris Pert as PCP - General (Internal Medicine) Bensimhon, Shaune Pascal, MD as PCP - Cardiology (Cardiology) Luretha Rued, RN as [place] Management  Patient Active Problem List   Diagnosis Date Noted   Malnutrition of moderate degree 09/23/2021   Dyslipidemia 09/21/2021   Anemia 09/21/2021   AKI (acute kidney injury) ([place]) 09/21/2021   Essential hypertension 09/20/2021   CHF (congestive heart failure) ([place]) 05/08/2021   Acute on chronic systolic CHF (congestive heart failure) ([place]) 05/06/2021   Mitral regurgitation 12/26/2020   COVID-19 virus infection 12/24/2020   CHF exacerbation ([place]) 12/23/2020   Acute on chronic systolic HF (heart failure) ([place]) 06/24/2018   Aortic insufficiency 06/22/2018   Acute  on chronic congestive heart failure ([place]) 06/22/2018   Elevated troponin 06/22/2018   CAD (coronary artery disease) 06/22/2018   Left bundle branch block 06/22/2018   History of iron deficiency anemia 06/22/2018   Acute on chronic heart failure ([place]) 06/22/2018   Chronic bilateral low back pain without sciatica 12/12/2015   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   Iron deficiency anemia 03/28/2015   Hyperlipidemia 10/12/2014    Current Outpatient Medications:    aspirin 81 MG EC tablet, Take 1 tablet (81 mg total) by mouth daily. Swallow whole., Disp: 30 tablet, Rfl: 0   atorvastatin (LIPITOR) 40 MG tablet, TAKE ONE TABLET BY MOUTH EVERYDAY TABLET 6PM FOR CHOLESTEROL, Disp: 30 tablet, Rfl: 2   carvedilol (COREG) 3.125 MG tablet, Take 1 tablet (3.125 mg total) by mouth 2 (two) times daily., Disp: 60 tablet, Rfl: 0   digoxin (LANOXIN) 0.125 MG tablet, Take 1 tablet (0.125 mg total) by mouth daily., Disp: 30 tablet, Rfl: 0   FARXIGA 10 MG TABS tablet, TAKE ONE TABLET BY MOUTH DAILY IN THE MORNING BEFORE BREAKFAST, Disp: 30 tablet, Rfl: 2   ferrous sulfate 325 (65 FE) MG tablet, TAKE ONE TABLET BY MOUTH TWO TIMES A DAY WITH MEALS (Patient taking differently: 325 mg 2 (two) times daily with a meal.), Disp: 180 tablet, Rfl: 7   furosemide (LASIX) 40 MG tablet, Take 1 tablet (40 mg total) by  mouth daily. (Patient taking differently: Take 40 mg by mouth every evening.), Disp: 90 tablet, Rfl: 3   potassium chloride SA (KLOR-CON M) 20 MEQ tablet, Take 1 tablet (20 mEq total) by mouth daily., Disp: 30 tablet, Rfl: 0   sertraline (ZOLOFT) 25 MG tablet, Take 1 tablet (25 mg total) by mouth daily., Disp: 30 tablet, Rfl: 5   spironolactone (ALDACTONE) 25 MG tablet, Take 1 tablet (25 mg total) by mouth at bedtime., Disp: 30 tablet, Rfl: 0 No Known Allergies    Social History   Socioeconomic History   Marital status: Single    Spouse name: Not on file   Number of children: Not on file    Years of education: Not on file   Highest education level: Not on file  Occupational History   Not on file  Tobacco Use   Smoking status: Former    Packs/day: 0.50    Types: Cigarettes   Smokeless tobacco: Never  Vaping Use   Vaping Use: Never used  Substance and Sexual Activity   Alcohol use: Yes    Comment: daily   Drug use: Yes    Types: Marijuana   Sexual activity: Not on file  Other Topics Concern   Not on file  Social History Narrative   Not on file   Social Determinants of Health   Financial Resource Strain: Not on file  Food Insecurity: Food Insecurity Present   Worried About [place] in the Last Year: Often true   Ran Out of Food in the Last Year: Sometimes true  Transportation Needs: No Transportation Needs   Lack of Transportation (Medical): No   Lack of Transportation (Non-Medical): No  Physical Activity: Not on file  Stress: Not on file  Social Connections: Not on file  Intimate Partner Violence: Not on file    Physical Exam      Future Appointments  Date Time Provider [place]  11/07/2021 12:00 PM [facility] PA/NP [facility] None  11/13/2021 10:00 AM [facility] SW-CCM CARE [facility] PEC  01/30/2022 10:40 AM Bensimhon, Shaune Pascal, MD [place] None       [name], [place] [facility] Paramedic  10/25/21

## 2021-10-26 ENCOUNTER — Encounter (HOSPITAL_COMMUNITY): Payer: Self-pay | Admitting: Emergency Medicine

## 2021-10-26 NOTE — Progress Notes (Signed)
Contacted [employer] to stop refills for [name]'s medicines as he has been non-compliant and has multiple unused medicines.  Will re-contact pharmacy when more meds are needed or if there are any changes.

## 2021-10-27 ENCOUNTER — Telehealth: Payer: Self-pay

## 2021-10-27 NOTE — Telephone Encounter (Signed)
   Telephone encounter was:  Successful.  10/27/2021 Name: ANEL PUROHIT MRN: [id number] DOB: 01-30-1956  [name] is a 66 y.o. year old male who is a primary care patient of Saguier, Kateri Mc . The community resource team was consulted for assistance with Transportation Needs   Care guide performed the following interventions: Follow up call placed to the patient to discuss status of referral. CG spoke to daughter and confirmed transportation resources have been received. At this time. Daughter understands applications are needed in order to book rides and if further assistance is needed to please contact me. Daughter understood.  Follow Up Plan:  No further follow up planned at this time. The patient has been provided with needed resources.  [facility] Guide, Embedded Care Coordination [facility]  [place], [place] [address]  Main Phone: [phone]  E-mail: [email or web address]  Website: [email or web address]

## 2021-11-01 ENCOUNTER — Other Ambulatory Visit (HOSPITAL_COMMUNITY): Payer: Self-pay | Admitting: Emergency Medicine

## 2021-11-01 ENCOUNTER — Telehealth (HOSPITAL_COMMUNITY): Payer: Self-pay | Admitting: *Deleted

## 2021-11-01 MED ORDER — POTASSIUM CHLORIDE CRYS ER 20 MEQ PO TBCR: 20.0000 meq | EXTENDED_RELEASE_TABLET | Freq: Two times a day (BID) | ORAL | 3 refills | Status: DC

## 2021-11-01 MED ORDER — FUROSEMIDE 40 MG PO TABS: 40.0000 mg | ORAL_TABLET | Freq: Two times a day (BID) | ORAL | 3 refills | Status: DC

## 2021-11-01 NOTE — Telephone Encounter (Signed)
[name] with paramedicine called to report pts ankles are very swollen. Pts weight is normal and denies shortness of breath and chest pain. Per Prince Rome, FNP Increase Lasix to 40 bid and increase KCL to 20 bid, bmet 7-10 days, and wear compression hose. [name] aware.

## 2021-11-01 NOTE — Progress Notes (Signed)
Paramedicine Encounter    Patient ID: Jeremy Powers, male    DOB: 10-17-1955, 66 y.o.   MRN: [id number]   BP 120/68 (BP Location: Right Arm, Patient Position: Sitting, Cuff Size: Normal)   Pulse 85   Resp 18   SpO2 94%   ATF Jeremy Powers A&O x 4, skin W&D with good color.  Pt did miss several doses of his meds and we discussed the importance of compliance.  I'm getting his family on board to help him not miss his doses.  Pt. Denies chest pain or SOB.  He does have some JVD and significant pedal edema.  Lung sounds are junky throughout but he is a smoker and they we junky and congested on my last visit.  Concerned about edema.  Weight and vitals without significant change from last visit.  Reached out to [facility] and sent pictures of his ankles -Med changes were made.  Increased 40mg . Lasix BID and 11mEq Potassium BID.  To follow up for blood work 7-10 days.   We discussed him going for bloodwork on Wednesday 6/21 so I can do next home visit 6/22 in anticipation of possible medication changes.  Also gonna try and get him some compression socks. Gave him information on [employer] as he advised he needed help getting cloths - got this information from [place].  He has some food insecurities and I provided him with a flyer for a [employer] for 6/24 and his family advised they will try and make arrangements for transportation for this event to take advantage of help.      Weight yesterday-did not weigh Last visit weight-132lbs  Patient Care Team: Saguier, Iris Pert as PCP - General (Internal Medicine) Bensimhon, Shaune Pascal, MD as PCP - Cardiology (Cardiology) Luretha Rued, RN as [place] Management  Patient Active Problem List   Diagnosis Date Noted   Malnutrition of moderate degree 09/23/2021   Dyslipidemia 09/21/2021   Anemia 09/21/2021   AKI (acute kidney injury) ([place]) 09/21/2021   Essential hypertension 09/20/2021    CHF (congestive heart failure) ([place]) 05/08/2021   Acute on chronic systolic CHF (congestive heart failure) ([place]) 05/06/2021   Mitral regurgitation 12/26/2020   COVID-19 virus infection 12/24/2020   CHF exacerbation ([place]) 12/23/2020   Acute on chronic systolic HF (heart failure) ([place]) 06/24/2018   Aortic insufficiency 06/22/2018   Acute on chronic congestive heart failure ([place]) 06/22/2018   Elevated troponin 06/22/2018   CAD (coronary artery disease) 06/22/2018   Left bundle branch block 06/22/2018   History of iron deficiency anemia 06/22/2018   Acute on chronic heart failure ([place]) 06/22/2018   Chronic bilateral low back pain without sciatica 12/12/2015   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   Iron deficiency anemia 03/28/2015   Hyperlipidemia 10/12/2014    Current Outpatient Medications:    aspirin 81 MG EC tablet, Take 1 tablet (81 mg total) by mouth daily. Swallow whole., Disp: 30 tablet, Rfl: 0   atorvastatin (LIPITOR) 40 MG tablet, TAKE ONE TABLET BY MOUTH EVERYDAY TABLET 6PM FOR CHOLESTEROL, Disp: 30 tablet, Rfl: 2   carvedilol (COREG) 3.125 MG tablet, Take 1 tablet (3.125 mg total) by mouth 2 (two) times daily., Disp: 60 tablet, Rfl: 0   digoxin (LANOXIN) 0.125 MG tablet, Take 1 tablet (0.125 mg total) by mouth daily., Disp: 30 tablet, Rfl: 0   FARXIGA 10 MG TABS tablet, TAKE ONE TABLET BY MOUTH DAILY IN THE MORNING  BEFORE BREAKFAST, Disp: 30 tablet, Rfl: 2   ferrous sulfate 325 (65 FE) MG tablet, TAKE ONE TABLET BY MOUTH TWO TIMES A DAY WITH MEALS (Patient taking differently: 325 mg 2 (two) times daily with a meal.), Disp: 180 tablet, Rfl: 7   furosemide (LASIX) 40 MG tablet, Take 1 tablet (40 mg total) by mouth daily. (Patient taking differently: Take 40 mg by mouth every evening.), Disp: 90 tablet, Rfl: 3   potassium chloride SA (KLOR-CON M) 20 MEQ tablet, Take 1 tablet (20 mEq total) by mouth daily., Disp: 30 tablet, Rfl: 0   sertraline (ZOLOFT) 25 MG tablet,  Take 1 tablet (25 mg total) by mouth daily., Disp: 30 tablet, Rfl: 5   spironolactone (ALDACTONE) 25 MG tablet, Take 1 tablet (25 mg total) by mouth at bedtime., Disp: 30 tablet, Rfl: 0 No Known Allergies    Social History   Socioeconomic History   Marital status: Single    Spouse name: Not on file   Number of children: Not on file   Years of education: Not on file   Highest education level: Not on file  Occupational History   Not on file  Tobacco Use   Smoking status: Former    Packs/day: 0.50    Types: Cigarettes   Smokeless tobacco: Never  Vaping Use   Vaping Use: Never used  Substance and Sexual Activity   Alcohol use: Yes    Comment: daily   Drug use: Yes    Types: Marijuana   Sexual activity: Not on file  Other Topics Concern   Not on file  Social History Narrative   Not on file   Social Determinants of Health   Financial Resource Strain: Not on file  Food Insecurity: Food Insecurity Present (10/17/2021)   Hunger Vital Sign    Worried About Running Out of Food in the Last Year: Often true    Ran Out of Food in the Last Year: Sometimes true  Transportation Needs: No Transportation Needs (12/27/2020)   PRAPARE - Hydrologist (Medical): No    Lack of Transportation (Non-Medical): No  Physical Activity: Not on file  Stress: Not on file  Social Connections: Not on file  Intimate Partner Violence: Not on file    Physical Exam      Future Appointments  Date Time Provider [place]  11/07/2021 12:00 PM [facility] PA/NP [facility] None  11/13/2021 10:00 AM [facility] SW-CCM CARE [facility]  01/30/2022 10:40 AM Bensimhon, Shaune Pascal, MD [place] None       [name], [facility] Paramedic  11/01/21

## 2021-11-06 ENCOUNTER — Telehealth (HOSPITAL_COMMUNITY): Payer: Self-pay

## 2021-11-06 ENCOUNTER — Telehealth (HOSPITAL_COMMUNITY): Payer: Self-pay | Admitting: Licensed Clinical Social Worker

## 2021-11-06 NOTE — Telephone Encounter (Signed)
Called and left patient a voicemail to confirm/remind patient of their appointment at the [facility] on 11/07/21.

## 2021-11-06 NOTE — Telephone Encounter (Signed)
CSW attempted to call all numbers on file for patient x2- left VM requesting they call back to discuss need for transportation to appt tomorrow.  Will continue to follow and assist as needed  Burna Sis, LCSW Clinical Social Worker [facility] Desk#: [phone] Cell#: [phone]

## 2021-11-07 ENCOUNTER — Other Ambulatory Visit (HOSPITAL_COMMUNITY): Payer: Self-pay | Admitting: Emergency Medicine

## 2021-11-07 ENCOUNTER — Encounter (HOSPITAL_COMMUNITY): Payer: Self-pay

## 2021-11-07 ENCOUNTER — Ambulatory Visit (HOSPITAL_COMMUNITY)
Admission: RE | Admit: 2021-11-07 | Discharge: 2021-11-07 | Disposition: A | Discharge status: Home / Self Care | Payer: Medicare Other | Source: Ambulatory Visit | Attending: Family Medicine | Admitting: Family Medicine

## 2021-11-07 ENCOUNTER — Telehealth (HOSPITAL_COMMUNITY): Payer: Self-pay | Admitting: Licensed Clinical Social Worker

## 2021-11-07 VITALS — BP 114/60 | HR 63 | Wt 126.6 lb

## 2021-11-07 DIAGNOSIS — I2510 Atherosclerotic heart disease of native coronary artery without angina pectoris: Secondary | ICD-10-CM | POA: Diagnosis not present

## 2021-11-07 DIAGNOSIS — D509 Iron deficiency anemia, unspecified: Secondary | ICD-10-CM | POA: Insufficient documentation

## 2021-11-07 DIAGNOSIS — I340 Nonrheumatic mitral (valve) insufficiency: Secondary | ICD-10-CM | POA: Diagnosis not present

## 2021-11-07 DIAGNOSIS — I447 Left bundle-branch block, unspecified: Secondary | ICD-10-CM | POA: Diagnosis not present

## 2021-11-07 DIAGNOSIS — I5022 Chronic systolic (congestive) heart failure: Secondary | ICD-10-CM | POA: Insufficient documentation

## 2021-11-07 DIAGNOSIS — I10 Essential (primary) hypertension: Secondary | ICD-10-CM

## 2021-11-07 DIAGNOSIS — Z7984 Long term (current) use of oral hypoglycemic drugs: Secondary | ICD-10-CM | POA: Insufficient documentation

## 2021-11-07 DIAGNOSIS — M7989 Other specified soft tissue disorders: Secondary | ICD-10-CM | POA: Insufficient documentation

## 2021-11-07 DIAGNOSIS — I110 Hypertensive heart disease with heart failure: Secondary | ICD-10-CM | POA: Insufficient documentation

## 2021-11-07 DIAGNOSIS — I5082 Biventricular heart failure: Secondary | ICD-10-CM | POA: Insufficient documentation

## 2021-11-07 DIAGNOSIS — Z955 Presence of coronary angioplasty implant and graft: Secondary | ICD-10-CM | POA: Insufficient documentation

## 2021-11-07 DIAGNOSIS — I428 Other cardiomyopathies: Secondary | ICD-10-CM | POA: Insufficient documentation

## 2021-11-07 DIAGNOSIS — Z951 Presence of aortocoronary bypass graft: Secondary | ICD-10-CM | POA: Diagnosis not present

## 2021-11-07 DIAGNOSIS — I351 Nonrheumatic aortic (valve) insufficiency: Secondary | ICD-10-CM | POA: Diagnosis not present

## 2021-11-07 DIAGNOSIS — Z550 Illiteracy and low-level literacy: Secondary | ICD-10-CM | POA: Diagnosis not present

## 2021-11-07 DIAGNOSIS — Z87891 Personal history of nicotine dependence: Secondary | ICD-10-CM | POA: Insufficient documentation

## 2021-11-07 DIAGNOSIS — Z8616 Personal history of COVID-19: Secondary | ICD-10-CM | POA: Diagnosis not present

## 2021-11-07 DIAGNOSIS — Z79899 Other long term (current) drug therapy: Secondary | ICD-10-CM | POA: Insufficient documentation

## 2021-11-07 DIAGNOSIS — R0602 Shortness of breath: Secondary | ICD-10-CM | POA: Insufficient documentation

## 2021-11-07 DIAGNOSIS — Z7982 Long term (current) use of aspirin: Secondary | ICD-10-CM | POA: Insufficient documentation

## 2021-11-07 LAB — BRAIN NATRIURETIC PEPTIDE: B Natriuretic Peptide: 2293.7 pg/mL — ABNORMAL HIGH (ref 0.0–100.0)

## 2021-11-07 LAB — BASIC METABOLIC PANEL
Anion gap: 8 (ref 5–15)
BUN: 12 mg/dL (ref 8–23)
CO2: 26 mmol/L (ref 22–32)
Calcium: 8.5 mg/dL — ABNORMAL LOW (ref 8.9–10.3)
Chloride: 102 mmol/L (ref 98–111)
Creatinine, Ser: 0.7 mg/dL (ref 0.61–1.24)
GFR, Estimated: >60 mL/min (ref >60)
Glucose, Bld: 88 mg/dL (ref 70–99)
Potassium: 3.5 mmol/L (ref 3.5–5.1)
Sodium: 136 mmol/L (ref 135–145)

## 2021-11-07 MED ORDER — POTASSIUM CHLORIDE CRYS ER 20 MEQ PO TBCR: 20.0000 meq | EXTENDED_RELEASE_TABLET | Freq: Every day | ORAL | 3 refills | Status: DC

## 2021-11-07 MED ORDER — FUROSEMIDE 40 MG PO TABS: 40.0000 mg | ORAL_TABLET | Freq: Every day | ORAL | 3 refills | Status: DC

## 2021-11-07 MED ORDER — ENTRESTO 97-103 MG PO TABS: 1.0000 | ORAL_TABLET | Freq: Two times a day (BID) | ORAL | 3 refills | Status: DC

## 2021-11-07 NOTE — Patient Instructions (Signed)
INCREASE Entresto to 97/103 mg, one tab twice a day DECREASE Lasix to 40 mg daily DECREASE Potassium to 20 meq one tab daily  Labs today We will only contact you if something comes back abnormal or we need to make some changes. Otherwise no news is good news!  Labs needed in 10-14 days  Please wear your compression hose daily, place them on as soon as you get up in the morning and remove before you go to bed at night.  Keep follow up as scheduled  Do the following things EVERYDAY: Weigh yourself in the morning before breakfast. Write it down and keep it in a log. Take your medicines as prescribed Eat low salt foods--Limit salt (sodium) to 2000 mg per day.  Stay as active as you can everyday Limit all fluids for the day to less than 2 liters  At the [facility], you and your health needs are our priority. As part of our continuing mission to provide you with exceptional heart care, we have created designated Provider Care Teams. These Care Teams include your primary Cardiologist (physician) and Advanced Practice Providers (APPs- Physician Assistants and Nurse Practitioners) who all work together to provide you with the care you need, when you need it.   You may see any of the following providers on your designated Care Team at your next follow up: Dr Arvilla Meres Dr Carron Curie, NP Robbie Lis, [place] [facility] [place], [place] Karle Plumber, PharmD   Please be sure to bring in all your medications bottles to every appointment.   If you have any questions or concerns before your next appointment please send [place] a message through [place] or call our office at [phone].    TO LEAVE A MESSAGE FOR THE NURSE SELECT OPTION 2, PLEASE LEAVE A MESSAGE INCLUDING: YOUR NAME DATE OF BIRTH CALL BACK NUMBER REASON FOR CALL**this is important as we prioritize the call backs  YOU WILL RECEIVE A CALL BACK THE SAME DAY AS LONG AS YOU CALL BEFORE  4:00 PM

## 2021-11-07 NOTE — Progress Notes (Signed)
After being unable to reach [name] by phone, drove to his apartment and spoke with him reminding him of his appointment today at 12:00 at the Clinic.  Contacted [name] who scheduled transportation.  Cab to p/u pt. @ 11:30.  Pt. Notified of same @ 9:44 a.m.

## 2021-11-07 NOTE — Progress Notes (Signed)
[facility] Note  Date:  11/07/2021   PCP:  [name], PA-C  Cardiologist:  Arvilla Meres, MD Primary HF: Dr. [name]  Chief Complaint: HF Follow up   History of Present Illness: Jeremy Powers is a 66 y.o.male CAD s/p PCI to LAD in 2015 and 2016, s/p CABG 2017, systolic HF EF 20-25%, microcytic/iron deficiency anemia followed by hematology and GI, LBBB, and tobacco use. Illiterate and cannot read.   In 2/20 he presented to [facility] ED with SOB, cough, and orthopnea in setting of med non-compliance. Was unable to afford copays with Medicaid. He was tachycardic and tachypneic on arrival, and was transferred to [facility] 06/22/18 for further evaluation. Echo showed newly decreased EF of 25-30% from 50-55% in 12/2017.  Underwent R/LHC showing severe 1 v disease with occluded LAD within previous stent. RCA and LCX normal. LIMA to LAD and SVG to D2 patent. Cath also showed low filling pressures with moderately low cardiac output. Meds adjusted as tolerated.   OV 04/05/20 returns for HF follow up after not being seen in > 1 year. Followed by HF Paramedicine. Lasix was stopped & [name] was started.  Echo 2/22 LVEF 20-25%, RV ok.    3/22 he was discharged from community paramedicine program and bubble pill packs arranged.   Seen in the ED 10/25/20 for CHF exacerbation in the setting of medication non-compliance. He was diuresed with IV lasix and discharged home. Sent to the ED 12/23/20 by his PCP and found to be A/C CHF and COVID +. Repeat echo EF 10-15%. Diuresed 13 lbs, beta blocker stopped with acute decompensation, digoxin added, and Entresto held with low BP.  Admitted 12/22 with A/C CHF after stopping his medications a month prior. Echo showed EF 20-25%. He was diuresed with IV lasix and GDMT restarted.   Out of HF meds at follow up, volume up. Meds restarted and paramedicine arranged.  Follow up 1/23, NYHA II, volume ok. Entresto increased.  Admitted 5/23 with a/c CHF,  due to non-compliance with medications. Diuresed with IV lasix. Hospitalization complicated by AKI. GDMT restarted after SCr improved. Discharged home, weight 138 lbs.  Follow up 5/23, NYHA II,, euvolemic. Paramedicine arranged.   Today he returns for HF follow up. I had him double his lasix for 3 days for LE swelling this week. Swelling has improved. Overall feeling fine. He has SOB going up stairs but none walking on flat ground or carrying groceries. Denies palpitations, CP, dizziness, or PND/Orthopnea. Appetite ok. No fever or chills. Weight at home stable. Taking all medications. Drinks 1 24 oz/day.  Cardiac Studies: - Echo (12/22): EF 20-25%, global HK, grade II DD, RV moderately reduced, moderate MR  - Echo (8/22): EF 10-15%, Repeat echo showed EF 10-15%, severe global hypokinesis with septal akinesis, LV severely dilated, grade II DD, RV fxn moderate to severely reduced, moderate to severe MR, moderate to severe AI.   - Echo (2/22): EF 20-25%, RV ok   - Echo (9/20): EF 20-25%   - Echo (2/20): EF 20-25%   - cMRI (06/27/18): EF 24%. Normal RV   - R/LHC (2/20) RA = 1 RV = 19/3 PA = 24/5 (14) PCW = 7 Fick cardiac output/index = 4.0/2.5 PVR = 1.9 WU Ao sat = 95% PA sat = 68%, 71%   Assessment: 1. Severe 1-vessel CAD with occluded LAD within previous stent 2. Normal RCA and LCX 3. LIMA to LAD widely patent 4. SVG - D2 widely patent  5. SVG - D1 likely occluded (grafts not marked)  ROS: All systems negative except as listed in HPI, PMH and Problem List.  Past Medical History:  Diagnosis Date   CHF (congestive heart failure) (HCC)    Cirrhosis ([place])    Coronary artery disease    Hypertension    Pancreatitis    Past Surgical History:  Procedure Laterality Date   CARDIAC SURGERY     RIGHT/LEFT HEART CATH AND CORONARY/GRAFT ANGIOGRAPHY N/A 06/24/2018   Procedure: RIGHT/LEFT HEART CATH AND CORONARY/GRAFT ANGIOGRAPHY;  Surgeon: Jolaine Artist, MD;  Location: [place]  CV LAB;  Service: Cardiovascular;  Laterality: N/A;   Current Outpatient Medications  Medication Sig Dispense Refill   aspirin 81 MG EC tablet Take 1 tablet (81 mg total) by mouth daily. Swallow whole. 30 tablet 0   atorvastatin (LIPITOR) 40 MG tablet TAKE ONE TABLET BY MOUTH EVERYDAY TABLET 6PM FOR CHOLESTEROL 30 tablet 2   carvedilol (COREG) 3.125 MG tablet Take 1 tablet (3.125 mg total) by mouth 2 (two) times daily. 60 tablet 0   digoxin (LANOXIN) 0.125 MG tablet Take 1 tablet (0.125 mg total) by mouth daily. 30 tablet 0   FARXIGA 10 MG TABS tablet TAKE ONE TABLET BY MOUTH DAILY IN THE MORNING BEFORE BREAKFAST 30 tablet 2   ferrous sulfate 325 (65 FE) MG tablet TAKE ONE TABLET BY MOUTH TWO TIMES A DAY WITH MEALS 180 tablet 7   furosemide (LASIX) 40 MG tablet Take 1 tablet (40 mg total) by mouth 2 (two) times daily. 180 tablet 3   potassium chloride SA (KLOR-CON M) 20 MEQ tablet Take 1 tablet (20 mEq total) by mouth 2 (two) times daily. 60 tablet 3   sertraline (ZOLOFT) 25 MG tablet Take 1 tablet (25 mg total) by mouth daily. 30 tablet 5   spironolactone (ALDACTONE) 25 MG tablet Take 1 tablet (25 mg total) by mouth at bedtime. 30 tablet 0   No current facility-administered medications for this encounter.   Allergies:   Patient has no known allergies.   Social History:  The patient  reports that he has quit smoking. His smoking use included cigarettes. He smoked an average of .5 packs per day. He has never used smokeless tobacco. He reports current alcohol use. He reports current drug use. Drug: Marijuana.   Family History:  The patient's family history is not on file.   ROS:  Please see the history of present illness.   All other systems are personally reviewed and negative.   Recent Labs: 06/02/2021: ALT 11; Pro B Natriuretic peptide (BNP) 2,394.0 09/20/2021: Hemoglobin 12.2; Platelets 188 09/24/2021: Magnesium 1.5 10/17/2021: B Natriuretic Peptide 2,393.4; BUN 9; Creatinine, Ser 0.67;  Potassium 3.8; Sodium 138  Personally reviewed   BP 114/60   Pulse 63   Wt 57.4 kg (126 lb 9.6 oz)   SpO2 96%   BMI 19.83 kg/m   Wt Readings from Last 3 Encounters:  11/07/21 57.4 kg (126 lb 9.6 oz)  11/01/21 60.4 kg (133 lb 3.2 oz)  10/25/21 59.9 kg (132 lb)   Physical Exam: General:  NAD. No resp difficulty, walked into clinic HEENT: edentulous Neck: Supple. JVP to jaw. Carotids 2+ bilat; no bruits. No lymphadenopathy or thryomegaly appreciated. Cor: PMI nondisplaced. Regular rate & rhythm. No rubs, gallops, 2/6 MR Lungs: Clear, diminished in bases. Abdomen: Soft, nontender, nondistended. No hepatosplenomegaly. No bruits or masses. Good bowel sounds. Extremities: No cyanosis, clubbing, rash, 1+edema Neuro: Alert & oriented x 3, cranial nerves  grossly intact. Moves all 4 extremities w/o difficulty. Affect pleasant.  ASSESSMENT AND PLAN: 1.  Chronic biventricular heart failure, NICM - Echo (2/20): EF 25-30%,   - cMRI (2/20) with EF 24% - Echo (9/20): EF 20-25% - Echo (2/22): EF 20-25% RV ok - Echo (8/22): EF 10-15%, moderately to severely reduced RV function, moderate to severe MR, moderate to severe AI. - Echo (12/22): EF 20-25%, global HK, grade II DD, RV moderately reduced, moderate MR - Previously referred to EP for consideration of CRT-D with wide LBBB but didn't follow up. Likely too advanced.  - Stable NYHA II, volume looks mildly up today, weight is down some. - Increase Entresto to 97/103 mg bid. - Decrease Lasix back to 40 mg daily and KCL to 20 daily. - Continue digoxin 0.125 mg daily. Dig level 0.2 on 10/17/21. - Continue carvedilol 3.125 mg bid. - Continue Farxiga 10 mg daily. - Continue spiro 25 mg daily. - He is not a candidate for advanced therapies. - BMET/BNP today, repeat BMET in 10-14 days. - Place compression hose.   2. CAD - S/P CABG 2017, LHC 06/2018  with occluded LAD normal LCX and RCA. LIMA to LAD and SVG to D2 patent (SVG to D1 occluded) - No  s/s angina.  - Continue aspirin 81 mg daily + atorvastatin.      3. LBBB - Previously referred to EP for consideration of CRT-D with wide LBBB but didn't follow up. Likely too advanced currently to benefit.   4. Valvular heart disease - Moderate MR and mild AI on echo 12/22. LVIDd 7.1 cm - Will follow.   5. h/o tobacco use - No longer smoking.  - Lives with people who do smoke.  6. ETOH use - Drinks 1 beer/day. - Discussed cutting back/stopping.   7. HTN - Stable. - GDMT as above   8. Anemia - Iron deficient. On oral Fe. - Followed by PCP.  9. Illiterate/SDOH - Cannot read. Able to write numbers. Needs assistance with medications.    - Continue w/ paramedicine. I will notify [name] of med changes today.  Follow up with Dr. [name] as scheduled.  Cydney Ok, FNP  11/07/2021 12:04 PM  [facility] [address] [facility] [place] [address] [phone] (office) [phone] (fax)

## 2021-11-07 NOTE — Progress Notes (Signed)
Paramedicine Encounter    Patient ID: Jeremy Powers, male    DOB: December 16, 1955, 66 y.o.   MRN: [id number]   BP 114/60 (BP Location: Left Arm, Patient Position: Sitting, Cuff Size: Normal)   Pulse 63   Resp 16   Wt 126 lb (57.2 kg)   SpO2 96%   BMI 19.73 kg/m   Weight yesterday-not taken Last visit weight-133.2lb  Made a visit to Mr. Tufo's house early this morning to remind him of his clinic appointment this morning at 12:00 because I couldn't get him on the phone to remind him and I saw where the clinic has tried to reach him by phone unsuccessfully as well.  [name] made arrangements for a cab to pick Mr. Tabor up for his visit.  Mr. Boehlke was able to make his visit.  Received msg from [place] inquiring about Mr. Laris's Entresto inquiring if he was or was not taking it.  I was able to review chart hx to find: 10/17/21 Sherryll Burger is on his med list, 10/25/21 Sherryll Burger isn't on his med list, 11/01/21 Sherryll Burger isn't on his med list, 11/07/21 Sherryll Burger still isn't on his list.  After his visit today per conversation with Meredith Staggers who spoke with [facility] and advised pt should take 1/2 Entresto 49/51 BID.  Pt's med box was reconciled to include same.  Pt reports to be feeling well.  His feet and ankles were much improved from my last home visit.  Pt got a prescription for Compression socks and I helped Mr. Okerlund determine the locations which would be closes form him and gave him directions.  I have worked to include the whole family in supporting Mr. Cimo with his medications and his visits.  Pt denies chest pain or SOB.  I have also discussed with him again about the [employer] that's being held this Saturday and his family says they have made arrangements to take advantage of this opportunity to get assistance with food. Next visit will be scheduled 11/16/21 @ 10:30.   Patient Care Team: Saguier, Kateri Mc as PCP - General (Internal Medicine) Bensimhon, Bevelyn Buckles,  MD as PCP - Cardiology (Cardiology) Colletta Maryland, RN as [facility] Care Management  Patient Active Problem List   Diagnosis Date Noted   Malnutrition of moderate degree 09/23/2021   Dyslipidemia 09/21/2021   Anemia 09/21/2021   AKI (acute kidney injury) (HCC) 09/21/2021   Essential hypertension 09/20/2021   CHF (congestive heart failure) (HCC) 05/08/2021   Acute on chronic systolic CHF (congestive heart failure) (HCC) 05/06/2021   Mitral regurgitation 12/26/2020   COVID-19 virus infection 12/24/2020   CHF exacerbation (HCC) 12/23/2020   Acute on chronic systolic HF (heart failure) (HCC) 06/24/2018   Aortic insufficiency 06/22/2018   Acute on chronic congestive heart failure (HCC) 06/22/2018   Elevated troponin 06/22/2018   CAD (coronary artery disease) 06/22/2018   Left bundle branch block 06/22/2018   History of iron deficiency anemia 06/22/2018   Acute on chronic heart failure (HCC) 06/22/2018   Chronic bilateral low back pain without sciatica 12/12/2015   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   Iron deficiency anemia 03/28/2015   Hyperlipidemia 10/12/2014    Current Outpatient Medications:    aspirin 81 MG EC tablet, Take 1 tablet (81 mg total) by mouth daily. Swallow whole., Disp: 30 tablet, Rfl: 0   atorvastatin (LIPITOR) 40 MG tablet, TAKE ONE TABLET BY MOUTH EVERYDAY TABLET 6PM FOR CHOLESTEROL, Disp: 30 tablet, Rfl:  2   carvedilol (COREG) 3.125 MG tablet, Take 1 tablet (3.125 mg total) by mouth 2 (two) times daily., Disp: 60 tablet, Rfl: 0   digoxin (LANOXIN) 0.125 MG tablet, Take 1 tablet (0.125 mg total) by mouth daily., Disp: 30 tablet, Rfl: 0   FARXIGA 10 MG TABS tablet, TAKE ONE TABLET BY MOUTH DAILY IN THE MORNING BEFORE BREAKFAST, Disp: 30 tablet, Rfl: 2   ferrous sulfate 325 (65 FE) MG tablet, TAKE ONE TABLET BY MOUTH TWO TIMES A DAY WITH MEALS, Disp: 180 tablet, Rfl: 7   furosemide (LASIX) 40 MG tablet, Take 1 tablet (40 mg total) by  mouth daily., Disp: 90 tablet, Rfl: 3   potassium chloride SA (KLOR-CON M) 20 MEQ tablet, Take 1 tablet (20 mEq total) by mouth daily., Disp: 30 tablet, Rfl: 3   sacubitril-valsartan (ENTRESTO) 97-103 MG, Take 1 tablet by mouth 2 (two) times daily., Disp: 60 tablet, Rfl: 3   sertraline (ZOLOFT) 25 MG tablet, Take 1 tablet (25 mg total) by mouth daily., Disp: 30 tablet, Rfl: 5   spironolactone (ALDACTONE) 25 MG tablet, Take 1 tablet (25 mg total) by mouth at bedtime., Disp: 30 tablet, Rfl: 0 No Known Allergies    Social History   Socioeconomic History   Marital status: Single    Spouse name: Not on file   Number of children: Not on file   Years of education: Not on file   Highest education level: Not on file  Occupational History   Not on file  Tobacco Use   Smoking status: Former    Packs/day: 0.50    Types: Cigarettes   Smokeless tobacco: Never  Vaping Use   Vaping Use: Never used  Substance and Sexual Activity   Alcohol use: Yes    Comment: daily   Drug use: Yes    Types: Marijuana   Sexual activity: Not on file  Other Topics Concern   Not on file  Social History Narrative   Not on file   Social Determinants of Health   Financial Resource Strain: Not on file  Food Insecurity: Food Insecurity Present (10/17/2021)   Hunger Vital Sign    Worried About Running Out of Food in the Last Year: Often true    Ran Out of Food in the Last Year: Sometimes true  Transportation Needs: No Transportation Needs (12/27/2020)   PRAPARE - Administrator, Civil Service (Medical): No    Lack of Transportation (Non-Medical): No  Physical Activity: Not on file  Stress: Not on file  Social Connections: Not on file  Intimate Partner Violence: Not on file    Physical Exam      Future Appointments  Date Time Provider Department Center  11/13/2021 10:00 AM [facility] SW[facility]CCM CARE [facility]  11/22/2021  2:00 PM [facility] LAB [facility] None  01/30/2022 10:40 AM Bensimhon, Bevelyn Buckles,  MD [facility] None       [name], EMT-Paramedic [phone] [facility] Paramedic  11/07/21

## 2021-11-07 NOTE — Telephone Encounter (Signed)
CSW arranged transportation for today's clinic appointment via [employer] taxi voucher. Patient notified of pick up time. [name], LCSW, CCSW-MCS [phone]

## 2021-11-13 ENCOUNTER — Ambulatory Visit: Payer: Medicaid Other

## 2021-11-16 ENCOUNTER — Telehealth: Payer: Self-pay | Admitting: *Deleted

## 2021-11-16 ENCOUNTER — Telehealth (HOSPITAL_COMMUNITY): Payer: Self-pay | Admitting: Licensed Clinical Social Worker

## 2021-11-16 ENCOUNTER — Other Ambulatory Visit (HOSPITAL_COMMUNITY): Payer: Self-pay | Admitting: Emergency Medicine

## 2021-11-16 DIAGNOSIS — I5022 Chronic systolic (congestive) heart failure: Secondary | ICD-10-CM

## 2021-11-16 NOTE — Telephone Encounter (Signed)
   Telephone encounter was:  Successful.  11/16/2021 Name: [name] MRN: [id number] DOB: 05/16/1956  [name] is a 66 y.o. year old male who is a primary care patient of Saguier, Kateri Mc . The community resource team was consulted for assistance with Transportation Needs   Care guide performed the following interventions: Patient provided with information about care guide support team and interviewed to confirm resource needs Discussed resources to assist with Transportation .  Patient and girl friend talked and have completed application for TAMs , will book taxi voucher transportation for patient with short notice lab work .  Follow Up Plan:  Care guide will follow up with patient by phone over the next day  Alois Cliche -[facility] Guide , Embedded Care Coordination [facility], Care Management  [phone] [address] [place] , [place] [address] Email : [email or web address]

## 2021-11-16 NOTE — Telephone Encounter (Signed)
CSW informed by [employer] that pt does not have ride to lab appt next week at clinic.  PT is enrolled with [facility] so CSW sent order to them to assist with transportation to that appt.  Will continue to follow and assist as needed  Burna Sis, LCSW Clinical Social Worker [facility] Desk#: [phone] Cell#: [phone]

## 2021-11-16 NOTE — Telephone Encounter (Signed)
   Telephone encounter was:  Successful.  11/16/2021 Name: HARREL FERRONE MRN: [id number] DOB: [birth year]  [name] is a 66 y.o. year old male who is a primary care patient of Saguier, Kateri Mc . The community resource team was consulted for assistance with Transportation Needs   Care guide performed the following interventions: Patient provided with information about care guide support team and interviewed to confirm resource needs Discussed resources to assist with transportation .  Follow Up Plan:  No further follow up planned at this time. The patient has been provided with needed resources. Alois Cliche -[facility] Guide , Embedded Care Coordination [facility], Care Management  [phone] [address] [place] , [place] [address] Email : [email or web address]

## 2021-11-16 NOTE — Progress Notes (Signed)
Paramedicine Encounter    Patient ID: Jeremy Powers, male    DOB: October 21, 1955, 66 y.o.   MRN: [id number]   BP 120/70 (BP Location: Left Arm, Patient Position: Sitting, Cuff Size: Normal)   Pulse 71   Resp 16   Wt 135 lb 6.4 oz (61.4 kg)   SpO2 97%   BMI 21.21 kg/m  Weight yesterday-not taken Last visit weight-126lb  Home visit with Jeremy Powers today.  He reports to be feeling well and has no complaints of chest pain or SOB.  Lung sounds w/ some mild expiratory wheezing throughout but good O2 saturation.  He is a smoker and has a chronic smokers cough per his normal.  His weight by records show a 9lb weight gain. I contacted HF Clinic triage to advise of numbers and was advised no changes at this time and to monitor for symptoms.   He is totally asymptomatic of this weight gain.  He tells me that he could have gotten confused when he wrote his numbers down.  I believe he was probably 136lb last week.   Today he is 135.4lb.  He has no edema to his extremities.  No JVD.  I will start watching him weigh on my visits from now on.  Advised him  to reach out if he starts feeling SOB.  Pt did very poorly with taking his medicines since last visit.  Med box reconciled and he took this morning's meds during our visit.  He has appointment next Wed for labs, contacted [place] to schedule transport for him.   Home visit complete.  Patient Care Team: Saguier, Kateri Mc as PCP - General (Internal Medicine) [name], Bevelyn Buckles, MD as PCP - Cardiology (Cardiology)  Patient Active Problem List   Diagnosis Date Noted  . Malnutrition of moderate degree 09/23/2021  . Dyslipidemia 09/21/2021  . Anemia 09/21/2021  . AKI (acute kidney injury) (HCC) 09/21/2021  . Essential hypertension 09/20/2021  . CHF (congestive heart failure) (HCC) 05/08/2021  . Acute on chronic systolic CHF (congestive heart failure) (HCC) 05/06/2021  . Mitral regurgitation 12/26/2020  . COVID-19 virus infection 12/24/2020  . CHF  exacerbation (HCC) 12/23/2020  . Acute on chronic systolic HF (heart failure) (HCC) 06/24/2018  . Aortic insufficiency 06/22/2018  . Acute on chronic congestive heart failure (HCC) 06/22/2018  . Elevated troponin 06/22/2018  . CAD (coronary artery disease) 06/22/2018  . Left bundle branch block 06/22/2018  . History of iron deficiency anemia 06/22/2018  . Acute on chronic heart failure (HCC) 06/22/2018  . Chronic bilateral low back pain without sciatica 12/12/2015  . 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  . Iron deficiency anemia 03/28/2015  . Hyperlipidemia 10/12/2014    Current Outpatient Medications:  .  aspirin 81 MG EC tablet, Take 1 tablet (81 mg total) by mouth daily. Swallow whole., Disp: 30 tablet, Rfl: 0 .  atorvastatin (LIPITOR) 40 MG tablet, TAKE ONE TABLET BY MOUTH EVERYDAY TABLET 6PM FOR CHOLESTEROL, Disp: 30 tablet, Rfl: 2 .  carvedilol (COREG) 3.125 MG tablet, Take 1 tablet (3.125 mg total) by mouth 2 (two) times daily., Disp: 60 tablet, Rfl: 0 .  digoxin (LANOXIN) 0.125 MG tablet, Take 1 tablet (0.125 mg total) by mouth daily., Disp: 30 tablet, Rfl: 0 .  FARXIGA 10 MG TABS tablet, TAKE ONE TABLET BY MOUTH DAILY IN THE MORNING BEFORE BREAKFAST, Disp: 30 tablet, Rfl: 2 .  ferrous sulfate 325 (65 FE) MG tablet, TAKE ONE TABLET BY MOUTH TWO TIMES A DAY WITH  MEALS, Disp: 180 tablet, Rfl: 7 .  furosemide (LASIX) 40 MG tablet, Take 1 tablet (40 mg total) by mouth daily., Disp: 90 tablet, Rfl: 3 .  potassium chloride SA (KLOR-CON M) 20 MEQ tablet, Take 1 tablet (20 mEq total) by mouth daily., Disp: 30 tablet, Rfl: 3 .  sacubitril-valsartan (ENTRESTO) 97-103 MG, Take 1 tablet by mouth 2 (two) times daily., Disp: 60 tablet, Rfl: 3 .  sertraline (ZOLOFT) 25 MG tablet, Take 1 tablet (25 mg total) by mouth daily., Disp: 30 tablet, Rfl: 5 .  spironolactone (ALDACTONE) 25 MG tablet, Take 1 tablet (25 mg total) by mouth at bedtime., Disp: 30 tablet, Rfl: 0 No Known  Allergies    Social History   Socioeconomic History  . Marital status: Single    Spouse name: Not on file  . Number of children: Not on file  . Years of education: Not on file  . Highest education level: Not on file  Occupational History  . Not on file  Tobacco Use  . Smoking status: Former    Packs/day: 0.50    Types: Cigarettes  . Smokeless tobacco: Never  Vaping Use  . Vaping Use: Never used  Substance and Sexual Activity  . Alcohol use: Yes    Comment: daily  . Drug use: Yes    Types: Marijuana  . Sexual activity: Not on file  Other Topics Concern  . Not on file  Social History Narrative  . Not on file   Social Determinants of Health   Financial Resource Strain: Not on file  Food Insecurity: Food Insecurity Present (10/17/2021)   Hunger Vital Sign   . Worried About Programme researcher, broadcasting/film/video in the Last Year: Often true   . Ran Out of Food in the Last Year: Sometimes true  Transportation Needs: Unmet Transportation Needs (11/16/2021)   PRAPARE - Transportation   . Lack of Transportation (Medical): Yes   . Lack of Transportation (Non-Medical): Yes  Physical Activity: Not on file  Stress: Not on file  Social Connections: Not on file  Intimate Partner Violence: Not on file    Physical Exam      Future Appointments  Date Time Provider Department Center  11/22/2021  2:00 PM [facility] None  01/30/2022 10:40 AM Bensimhon, Bevelyn Buckles, MD [facility] None       [name], EMT-Paramedic [phone] [facility] Paramedic  11/16/21

## 2021-11-22 ENCOUNTER — Other Ambulatory Visit (HOSPITAL_COMMUNITY): Payer: Medicare Other

## 2021-11-23 ENCOUNTER — Other Ambulatory Visit (HOSPITAL_COMMUNITY): Payer: Self-pay | Admitting: Emergency Medicine

## 2021-11-23 NOTE — Progress Notes (Signed)
Paramedicine Encounter    Patient ID: Jeremy Powers, male    DOB: [birth year], 66 y.o.   MRN: [id number]   There were no vitals taken for this visit. Weight yesterday-not taking Last visit weight-135lb  Home visit with Jeremy Powers.  He has not complaint of chest pain or SOB and lung sounds are clear.  His weight is within normal limits.  He does have 2+ pitting edema that goes almost to his knees.  Reached out to HF Triage and advised Meredith Staggers no med changes at this time and that he needs to go get his compression socks and keep his legs elevated.  Discussed this with Jeremy Powers and he states he needs to make arrangements for transportation to get same.  Based on his med box he has been taking his meds.  Medications reconciled.  Home visit complete.  Patient Care Team: Saguier, Kateri Mc as PCP - General (Internal Medicine) [name] Bevelyn Buckles, MD as PCP - Cardiology (Cardiology)  Patient Active Problem List   Diagnosis Date Noted   Malnutrition of moderate degree 09/23/2021   Dyslipidemia 09/21/2021   Anemia 09/21/2021   AKI (acute kidney injury) (HCC) 09/21/2021   Essential hypertension 09/20/2021   CHF (congestive heart failure) (HCC) 05/08/2021   Acute on chronic systolic CHF (congestive heart failure) (HCC) 05/06/2021   Mitral regurgitation 12/26/2020   COVID-19 virus infection 12/24/2020   CHF exacerbation (HCC) 12/23/2020   Acute on chronic systolic HF (heart failure) (HCC) 06/24/2018   Aortic insufficiency 06/22/2018   Acute on chronic congestive heart failure (HCC) 06/22/2018   Elevated troponin 06/22/2018   CAD (coronary artery disease) 06/22/2018   Left bundle branch block 06/22/2018   History of iron deficiency anemia 06/22/2018   Acute on chronic heart failure (HCC) 06/22/2018   Chronic bilateral low back pain without sciatica 12/12/2015   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   Iron deficiency anemia 03/28/2015   Hyperlipidemia 10/12/2014     Current Outpatient Medications:    aspirin 81 MG EC tablet, Take 1 tablet (81 mg total) by mouth daily. Swallow whole., Disp: 30 tablet, Rfl: 0   atorvastatin (LIPITOR) 40 MG tablet, TAKE ONE TABLET BY MOUTH EVERYDAY TABLET 6PM FOR CHOLESTEROL, Disp: 30 tablet, Rfl: 2   carvedilol (COREG) 3.125 MG tablet, Take 1 tablet (3.125 mg total) by mouth 2 (two) times daily., Disp: 60 tablet, Rfl: 0   digoxin (LANOXIN) 0.125 MG tablet, Take 1 tablet (0.125 mg total) by mouth daily., Disp: 30 tablet, Rfl: 0   FARXIGA 10 MG TABS tablet, TAKE ONE TABLET BY MOUTH DAILY IN THE MORNING BEFORE BREAKFAST, Disp: 30 tablet, Rfl: 2   ferrous sulfate 325 (65 FE) MG tablet, TAKE ONE TABLET BY MOUTH TWO TIMES A DAY WITH MEALS, Disp: 180 tablet, Rfl: 7   furosemide (LASIX) 40 MG tablet, Take 1 tablet (40 mg total) by mouth daily., Disp: 90 tablet, Rfl: 3   potassium chloride SA (KLOR-CON M) 20 MEQ tablet, Take 1 tablet (20 mEq total) by mouth daily., Disp: 30 tablet, Rfl: 3   sacubitril-valsartan (ENTRESTO) 97-103 MG, Take 1 tablet by mouth 2 (two) times daily., Disp: 60 tablet, Rfl: 3   sertraline (ZOLOFT) 25 MG tablet, Take 1 tablet (25 mg total) by mouth daily., Disp: 30 tablet, Rfl: 5   spironolactone (ALDACTONE) 25 MG tablet, Take 1 tablet (25 mg total) by mouth at bedtime., Disp: 30 tablet, Rfl: 0 No Known Allergies    Social History   Socioeconomic  History   Marital status: Single    Spouse name: Not on file   Number of children: Not on file   Years of education: Not on file   Highest education level: Not on file  Occupational History   Not on file  Tobacco Use   Smoking status: Former    Packs/day: 0.50    Types: Cigarettes   Smokeless tobacco: Never  Vaping Use   Vaping Use: Never used  Substance and Sexual Activity   Alcohol use: Yes    Comment: daily   Drug use: Yes    Types: Marijuana   Sexual activity: Not on file  Other Topics Concern   Not on file  Social History Narrative   Not  on file   Social Determinants of Health   Financial Resource Strain: Not on file  Food Insecurity: Food Insecurity Present (10/17/2021)   Hunger Vital Sign    Worried About Running Out of Food in the Last Year: Often true    Ran Out of Food in the Last Year: Sometimes true  Transportation Needs: No Transportation Needs (11/16/2021)   PRAPARE - Administrator, Civil Service (Medical): No    Lack of Transportation (Non-Medical): No  Recent Concern: Transportation Needs - Unmet Transportation Needs (11/16/2021)   PRAPARE - Transportation    Lack of Transportation (Medical): Yes    Lack of Transportation (Non-Medical): Yes  Physical Activity: Not on file  Stress: Not on file  Social Connections: Not on file  Intimate Partner Violence: Not on file    Physical Exam      Future Appointments  Date Time Provider Department Center  01/30/2022 10:40 AM Bensimhon, Bevelyn Buckles, MD [facility] None       [name], EMT-Paramedic [phone] [facility] Paramedic  11/23/21

## 2021-11-29 ENCOUNTER — Other Ambulatory Visit (HOSPITAL_COMMUNITY): Payer: Self-pay | Admitting: Emergency Medicine

## 2021-11-29 NOTE — Progress Notes (Signed)
Paramedicine Encounter    Patient ID: Jeremy Powers, male    DOB: 04/06/1956, 66 y.o.   MRN: [id number]   There were no vitals taken for this visit. Weight yesterday-did not weigh Last visit weight-137lb  ATF Jeremy Powers walking outside his apartment.  He is A&O x 4, skin warm and dry with good color.  He denies chest pain or SOB.  Lung sounds clear and equal bilat.  Pt was 100% compliant with his medications for the first time since I've been visiting him.  His family has been working with him to help him be successful.  Med box reconciled.   Some mild edema noted in his ankles but much improved from last viist .  Weight is down 2lbs.  He has a prescription for support socks.  I was able to talk to his nephew about giving him a ride to get measured for same.  Pt states he has financial limitations that is keeping him from getting his socks right now.  Insurance doesn't cover what he is prescribed and it's gonna run about $70.  Reached out to Jeremy Powers for possible financial assistance with this.  Home visit complete.  Patient Care Team: Saguier, Kateri Mc as PCP - General (Internal Medicine) [name] Bevelyn Buckles, MD as PCP - Cardiology (Cardiology)  Patient Active Problem List   Diagnosis Date Noted   Malnutrition of moderate degree 09/23/2021   Dyslipidemia 09/21/2021   Anemia 09/21/2021   AKI (acute kidney injury) (HCC) 09/21/2021   Essential hypertension 09/20/2021   CHF (congestive heart failure) (HCC) 05/08/2021   Acute on chronic systolic CHF (congestive heart failure) (HCC) 05/06/2021   Mitral regurgitation 12/26/2020   COVID-19 virus infection 12/24/2020   CHF exacerbation (HCC) 12/23/2020   Acute on chronic systolic HF (heart failure) (HCC) 06/24/2018   Aortic insufficiency 06/22/2018   Acute on chronic congestive heart failure (HCC) 06/22/2018   Elevated troponin 06/22/2018   CAD (coronary artery disease) 06/22/2018   Left bundle branch block 06/22/2018   History of iron  deficiency anemia 06/22/2018   Acute on chronic heart failure (HCC) 06/22/2018   Chronic bilateral low back pain without sciatica 12/12/2015   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   Iron deficiency anemia 03/28/2015   Hyperlipidemia 10/12/2014    Current Outpatient Medications:    aspirin 81 MG EC tablet, Take 1 tablet (81 mg total) by mouth daily. Swallow whole., Disp: 30 tablet, Rfl: 0   atorvastatin (LIPITOR) 40 MG tablet, TAKE ONE TABLET BY MOUTH EVERYDAY TABLET 6PM FOR CHOLESTEROL, Disp: 30 tablet, Rfl: 2   carvedilol (COREG) 3.125 MG tablet, Take 1 tablet (3.125 mg total) by mouth 2 (two) times daily., Disp: 60 tablet, Rfl: 0   digoxin (LANOXIN) 0.125 MG tablet, Take 1 tablet (0.125 mg total) by mouth daily., Disp: 30 tablet, Rfl: 0   FARXIGA 10 MG TABS tablet, TAKE ONE TABLET BY MOUTH DAILY IN THE MORNING BEFORE BREAKFAST, Disp: 30 tablet, Rfl: 2   ferrous sulfate 325 (65 FE) MG tablet, TAKE ONE TABLET BY MOUTH TWO TIMES A DAY WITH MEALS, Disp: 180 tablet, Rfl: 7   furosemide (LASIX) 40 MG tablet, Take 1 tablet (40 mg total) by mouth daily., Disp: 90 tablet, Rfl: 3   potassium chloride SA (KLOR-CON M) 20 MEQ tablet, Take 1 tablet (20 mEq total) by mouth daily., Disp: 30 tablet, Rfl: 3   sertraline (ZOLOFT) 25 MG tablet, Take 1 tablet (25 mg total) by mouth daily., Disp: 30 tablet,  Rfl: 5   spironolactone (ALDACTONE) 25 MG tablet, Take 1 tablet (25 mg total) by mouth at bedtime., Disp: 30 tablet, Rfl: 0   sacubitril-valsartan (ENTRESTO) 97-103 MG, Take 1 tablet by mouth 2 (two) times daily., Disp: 60 tablet, Rfl: 3 No Known Allergies    Social History   Socioeconomic History   Marital status: Single    Spouse name: Not on file   Number of children: Not on file   Years of education: Not on file   Highest education level: Not on file  Occupational History   Not on file  Tobacco Use   Smoking status: Former    Packs/day: 0.50    Types: Cigarettes   Smokeless  tobacco: Never  Vaping Use   Vaping Use: Never used  Substance and Sexual Activity   Alcohol use: Yes    Comment: daily   Drug use: Yes    Types: Marijuana   Sexual activity: Not on file  Other Topics Concern   Not on file  Social History Narrative   Not on file   Social Determinants of Health   Financial Resource Strain: Not on file  Food Insecurity: Food Insecurity Present (10/17/2021)   Hunger Vital Sign    Worried About Running Out of Food in the Last Year: Often true    Ran Out of Food in the Last Year: Sometimes true  Transportation Needs: No Transportation Needs (11/16/2021)   PRAPARE - Administrator, Civil Service (Medical): No    Lack of Transportation (Non-Medical): No  Recent Concern: Transportation Needs - Unmet Transportation Needs (11/16/2021)   PRAPARE - Transportation    Lack of Transportation (Medical): Yes    Lack of Transportation (Non-Medical): Yes  Physical Activity: Not on file  Stress: Not on file  Social Connections: Not on file  Intimate Partner Violence: Not on file    Physical Exam      Future Appointments  Date Time Provider Department Center  01/30/2022 10:40 AM Bensimhon, Bevelyn Buckles, MD [facility] None       [name], EMT-Paramedic [phone] [facility] Paramedic  11/29/21

## 2021-12-06 ENCOUNTER — Other Ambulatory Visit (HOSPITAL_COMMUNITY): Payer: Self-pay | Admitting: Emergency Medicine

## 2021-12-06 NOTE — Progress Notes (Signed)
Paramedicine Encounter    Patient ID: Jeremy Powers, male    DOB: [birth year], 66 y.o.   MRN: [id number]    BP 120/60 (BP Location: Right Arm, Patient Position: Sitting, Cuff Size: Normal)   Pulse (!) 55   Resp 16   Wt 129 lb 3.2 oz (58.6 kg)   SpO2 96%   BMI 20.24 kg/m   Weight yesterday-not taken Last visit weight-135lb  ATF Mr. Busler A&O x 4, skin warm and dry with good color.  Pt. Denies chest pain or SOB.  Lung sounds clear and equal bilat.  No edema noted.  Pt missed only 1 p.m. dose of his meds this week.  His family continues to help make sure he takes his meds.  Med box reconciled.  Next clinic appointment not till Sept.  Home visit complete. Patient Care Team: Saguier, Kateri Mc as PCP - General (Internal Medicine) [name], Bevelyn Buckles, MD as PCP - Cardiology (Cardiology)  Patient Active Problem List   Diagnosis Date Noted  . Malnutrition of moderate degree 09/23/2021  . Dyslipidemia 09/21/2021  . Anemia 09/21/2021  . AKI (acute kidney injury) (HCC) 09/21/2021  . Essential hypertension 09/20/2021  . CHF (congestive heart failure) (HCC) 05/08/2021  . Acute on chronic systolic CHF (congestive heart failure) (HCC) 05/06/2021  . Mitral regurgitation 12/26/2020  . COVID-19 virus infection 12/24/2020  . CHF exacerbation (HCC) 12/23/2020  . Acute on chronic systolic HF (heart failure) (HCC) 06/24/2018  . Aortic insufficiency 06/22/2018  . Acute on chronic congestive heart failure (HCC) 06/22/2018  . Elevated troponin 06/22/2018  . CAD (coronary artery disease) 06/22/2018  . Left bundle branch block 06/22/2018  . History of iron deficiency anemia 06/22/2018  . Acute on chronic heart failure (HCC) 06/22/2018  . Chronic bilateral low back pain without sciatica 12/12/2015  . 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  . Iron deficiency anemia 03/28/2015  . Hyperlipidemia 10/12/2014    Current Outpatient Medications:  .  aspirin 81 MG EC tablet, Take 1 tablet  (81 mg total) by mouth daily. Swallow whole., Disp: 30 tablet, Rfl: 0 .  atorvastatin (LIPITOR) 40 MG tablet, TAKE ONE TABLET BY MOUTH EVERYDAY TABLET 6PM FOR CHOLESTEROL, Disp: 30 tablet, Rfl: 2 .  carvedilol (COREG) 3.125 MG tablet, Take 1 tablet (3.125 mg total) by mouth 2 (two) times daily., Disp: 60 tablet, Rfl: 0 .  digoxin (LANOXIN) 0.125 MG tablet, Take 1 tablet (0.125 mg total) by mouth daily., Disp: 30 tablet, Rfl: 0 .  FARXIGA 10 MG TABS tablet, TAKE ONE TABLET BY MOUTH DAILY IN THE MORNING BEFORE BREAKFAST, Disp: 30 tablet, Rfl: 2 .  ferrous sulfate 325 (65 FE) MG tablet, TAKE ONE TABLET BY MOUTH TWO TIMES A DAY WITH MEALS, Disp: 180 tablet, Rfl: 7 .  furosemide (LASIX) 40 MG tablet, Take 1 tablet (40 mg total) by mouth daily., Disp: 90 tablet, Rfl: 3 .  potassium chloride SA (KLOR-CON M) 20 MEQ tablet, Take 1 tablet (20 mEq total) by mouth daily., Disp: 30 tablet, Rfl: 3 .  sacubitril-valsartan (ENTRESTO) 97-103 MG, Take 1 tablet by mouth 2 (two) times daily., Disp: 60 tablet, Rfl: 3 .  sertraline (ZOLOFT) 25 MG tablet, Take 1 tablet (25 mg total) by mouth daily., Disp: 30 tablet, Rfl: 5 .  spironolactone (ALDACTONE) 25 MG tablet, Take 1 tablet (25 mg total) by mouth at bedtime., Disp: 30 tablet, Rfl: 0 No Known Allergies    Social History   Socioeconomic History  . Marital status: Single  Spouse name: Not on file  . Number of children: Not on file  . Years of education: Not on file  . Highest education level: Not on file  Occupational History  . Not on file  Tobacco Use  . Smoking status: Former    Packs/day: 0.50    Types: Cigarettes  . Smokeless tobacco: Never  Vaping Use  . Vaping Use: Never used  Substance and Sexual Activity  . Alcohol use: Yes    Comment: daily  . Drug use: Yes    Types: Marijuana  . Sexual activity: Not on file  Other Topics Concern  . Not on file  Social History Narrative  . Not on file   Social Determinants of Health   Financial  Resource Strain: Not on file  Food Insecurity: Food Insecurity Present (10/17/2021)   Hunger Vital Sign   . Worried About Programme researcher, broadcasting/film/video in the Last Year: Often true   . Ran Out of Food in the Last Year: Sometimes true  Transportation Needs: No Transportation Needs (11/16/2021)   PRAPARE - Transportation   . Lack of Transportation (Medical): No   . Lack of Transportation (Non-Medical): No  Recent Concern: Transportation Needs - Unmet Transportation Needs (11/16/2021)   PRAPARE - Transportation   . Lack of Transportation (Medical): Yes   . Lack of Transportation (Non-Medical): Yes  Physical Activity: Not on file  Stress: Not on file  Social Connections: Not on file  Intimate Partner Violence: Not on file    Physical Exam      Future Appointments  Date Time Provider Department Center  01/30/2022 10:40 AM Bensimhon, Bevelyn Buckles, MD [facility] None       [name], EMT-Paramedic [phone] [facility] Paramedic  12/06/21

## 2021-12-13 ENCOUNTER — Other Ambulatory Visit (HOSPITAL_COMMUNITY): Payer: Self-pay | Admitting: Emergency Medicine

## 2021-12-13 NOTE — Progress Notes (Signed)
Paramedicine Encounter    Patient ID: Jeremy Powers, male    DOB: [birth year], 66 y.o.   MRN: [id number]   There were no vitals taken for this visit. Weight yesterday-not taken Last visit weight-129lb ATF Mr. Winner A&O  x 4, skin W&D w/ good color.  Pt denies chest pain or SOB.  Lung sounds clear and equal bilat.  No edema noted.  Weight is down 5lb from last visit.  He report to be eating plenty and family confirms same.  He missed two morning doses of his meds out of 7 days.  His family continues to help him be more compliant.  Med box reconciled.  Called in refills for Farxiga and Digoxin to [facility].  No further needs at this time.  Home visit complete.  Patient Care Team: Saguier, Kateri Mc as PCP - General (Internal Medicine) [name] Bevelyn Buckles, MD as PCP - Cardiology (Cardiology)  Patient Active Problem List   Diagnosis Date Noted   Malnutrition of moderate degree 09/23/2021   Dyslipidemia 09/21/2021   Anemia 09/21/2021   AKI (acute kidney injury) (HCC) 09/21/2021   Essential hypertension 09/20/2021   CHF (congestive heart failure) (HCC) 05/08/2021   Acute on chronic systolic CHF (congestive heart failure) (HCC) 05/06/2021   Mitral regurgitation 12/26/2020   COVID-19 virus infection 12/24/2020   CHF exacerbation (HCC) 12/23/2020   Acute on chronic systolic HF (heart failure) (HCC) 06/24/2018   Aortic insufficiency 06/22/2018   Acute on chronic congestive heart failure (HCC) 06/22/2018   Elevated troponin 06/22/2018   CAD (coronary artery disease) 06/22/2018   Left bundle branch block 06/22/2018   History of iron deficiency anemia 06/22/2018   Acute on chronic heart failure (HCC) 06/22/2018   Chronic bilateral low back pain without sciatica 12/12/2015   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   Iron deficiency anemia 03/28/2015   Hyperlipidemia 10/12/2014    Current Outpatient Medications:    aspirin 81 MG EC tablet, Take 1 tablet (81 mg total) by  mouth daily. Swallow whole., Disp: 30 tablet, Rfl: 0   atorvastatin (LIPITOR) 40 MG tablet, TAKE ONE TABLET BY MOUTH EVERYDAY TABLET 6PM FOR CHOLESTEROL, Disp: 30 tablet, Rfl: 2   carvedilol (COREG) 3.125 MG tablet, Take 1 tablet (3.125 mg total) by mouth 2 (two) times daily., Disp: 60 tablet, Rfl: 0   digoxin (LANOXIN) 0.125 MG tablet, Take 1 tablet (0.125 mg total) by mouth daily., Disp: 30 tablet, Rfl: 0   FARXIGA 10 MG TABS tablet, TAKE ONE TABLET BY MOUTH DAILY IN THE MORNING BEFORE BREAKFAST, Disp: 30 tablet, Rfl: 2   ferrous sulfate 325 (65 FE) MG tablet, TAKE ONE TABLET BY MOUTH TWO TIMES A DAY WITH MEALS, Disp: 180 tablet, Rfl: 7   furosemide (LASIX) 40 MG tablet, Take 1 tablet (40 mg total) by mouth daily., Disp: 90 tablet, Rfl: 3   potassium chloride SA (KLOR-CON M) 20 MEQ tablet, Take 1 tablet (20 mEq total) by mouth daily., Disp: 30 tablet, Rfl: 3   sacubitril-valsartan (ENTRESTO) 97-103 MG, Take 1 tablet by mouth 2 (two) times daily., Disp: 60 tablet, Rfl: 3   sertraline (ZOLOFT) 25 MG tablet, Take 1 tablet (25 mg total) by mouth daily., Disp: 30 tablet, Rfl: 5   spironolactone (ALDACTONE) 25 MG tablet, Take 1 tablet (25 mg total) by mouth at bedtime., Disp: 30 tablet, Rfl: 0 No Known Allergies    Social History   Socioeconomic History   Marital status: Single    Spouse name:  Not on file   Number of children: Not on file   Years of education: Not on file   Highest education level: Not on file  Occupational History   Not on file  Tobacco Use   Smoking status: Former    Packs/day: 0.50    Types: Cigarettes   Smokeless tobacco: Never  Vaping Use   Vaping Use: Never used  Substance and Sexual Activity   Alcohol use: Yes    Comment: daily   Drug use: Yes    Types: Marijuana   Sexual activity: Not on file  Other Topics Concern   Not on file  Social History Narrative   Not on file   Social Determinants of Health   Financial Resource Strain: Not on file  Food  Insecurity: Food Insecurity Present (10/17/2021)   Hunger Vital Sign    Worried About Running Out of Food in the Last Year: Often true    Ran Out of Food in the Last Year: Sometimes true  Transportation Needs: No Transportation Needs (11/16/2021)   PRAPARE - Administrator, Civil Service (Medical): No    Lack of Transportation (Non-Medical): No  Recent Concern: Transportation Needs - Unmet Transportation Needs (11/16/2021)   PRAPARE - Transportation    Lack of Transportation (Medical): Yes    Lack of Transportation (Non-Medical): Yes  Physical Activity: Not on file  Stress: Not on file  Social Connections: Not on file  Intimate Partner Violence: Not on file    Physical Exam      Future Appointments  Date Time Provider Department Center  01/30/2022 10:40 AM Bensimhon, Bevelyn Buckles, MD [facility] None       [name], EMT-Paramedic [phone] [facility] Paramedic  12/13/21

## 2021-12-20 ENCOUNTER — Other Ambulatory Visit (HOSPITAL_COMMUNITY): Payer: Self-pay | Admitting: Emergency Medicine

## 2021-12-20 NOTE — Progress Notes (Signed)
Paramedicine Encounter    Patient ID: Jeremy Powers, male    DOB: 01/04/1956, 66 y.o.   MRN: [id number]   Pulse 60   Resp 16   Wt 124 lb 6.4 oz (56.4 kg)   SpO2 96%   BMI 19.48 kg/m  Weight yesterday-not taken Last visit weight-124.6lb  ATF Mr. Deshmukh A&O x 4, skin W&D w/ good color.  Pt. Once again did an amazing job being compliant with all his meds.  H reports to be feeling well.  He denies chest pain or SOB.  No edema noted.  Med box reconciled.  Called in refills for Atorvastatin and Ferrous Sulfate at [facility].  Home visit complete.    Patient Care Team: Saguier, Kateri Mc as PCP - General (Internal Medicine) [name] Bevelyn Buckles, MD as PCP - Cardiology (Cardiology)  Patient Active Problem List   Diagnosis Date Noted   Malnutrition of moderate degree 09/23/2021   Dyslipidemia 09/21/2021   Anemia 09/21/2021   AKI (acute kidney injury) (HCC) 09/21/2021   Essential hypertension 09/20/2021   CHF (congestive heart failure) (HCC) 05/08/2021   Acute on chronic systolic CHF (congestive heart failure) (HCC) 05/06/2021   Mitral regurgitation 12/26/2020   COVID-19 virus infection 12/24/2020   CHF exacerbation (HCC) 12/23/2020   Acute on chronic systolic HF (heart failure) (HCC) 06/24/2018   Aortic insufficiency 06/22/2018   Acute on chronic congestive heart failure (HCC) 06/22/2018   Elevated troponin 06/22/2018   CAD (coronary artery disease) 06/22/2018   Left bundle branch block 06/22/2018   History of iron deficiency anemia 06/22/2018   Acute on chronic heart failure (HCC) 06/22/2018   Chronic bilateral low back pain without sciatica 12/12/2015   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   Iron deficiency anemia 03/28/2015   Hyperlipidemia 10/12/2014    Current Outpatient Medications:    aspirin 81 MG EC tablet, Take 1 tablet (81 mg total) by mouth daily. Swallow whole., Disp: 30 tablet, Rfl: 0   atorvastatin (LIPITOR) 40 MG tablet, TAKE ONE TABLET  BY MOUTH EVERYDAY TABLET 6PM FOR CHOLESTEROL, Disp: 30 tablet, Rfl: 2   carvedilol (COREG) 3.125 MG tablet, Take 1 tablet (3.125 mg total) by mouth 2 (two) times daily., Disp: 60 tablet, Rfl: 0   digoxin (LANOXIN) 0.125 MG tablet, Take 1 tablet (0.125 mg total) by mouth daily., Disp: 30 tablet, Rfl: 0   FARXIGA 10 MG TABS tablet, TAKE ONE TABLET BY MOUTH DAILY IN THE MORNING BEFORE BREAKFAST, Disp: 30 tablet, Rfl: 2   ferrous sulfate 325 (65 FE) MG tablet, TAKE ONE TABLET BY MOUTH TWO TIMES A DAY WITH MEALS, Disp: 180 tablet, Rfl: 7   furosemide (LASIX) 40 MG tablet, Take 1 tablet (40 mg total) by mouth daily., Disp: 90 tablet, Rfl: 3   potassium chloride SA (KLOR-CON M) 20 MEQ tablet, Take 1 tablet (20 mEq total) by mouth daily., Disp: 30 tablet, Rfl: 3   sacubitril-valsartan (ENTRESTO) 97-103 MG, Take 1 tablet by mouth 2 (two) times daily., Disp: 60 tablet, Rfl: 3   sertraline (ZOLOFT) 25 MG tablet, Take 1 tablet (25 mg total) by mouth daily., Disp: 30 tablet, Rfl: 5   spironolactone (ALDACTONE) 25 MG tablet, Take 1 tablet (25 mg total) by mouth at bedtime., Disp: 30 tablet, Rfl: 0 No Known Allergies    Social History   Socioeconomic History   Marital status: Single    Spouse name: Not on file   Number of children: Not on file   Years of  education: Not on file   Highest education level: Not on file  Occupational History   Not on file  Tobacco Use   Smoking status: Former    Packs/day: 0.50    Types: Cigarettes   Smokeless tobacco: Never  Vaping Use   Vaping Use: Never used  Substance and Sexual Activity   Alcohol use: Yes    Comment: daily   Drug use: Yes    Types: Marijuana   Sexual activity: Not on file  Other Topics Concern   Not on file  Social History Narrative   Not on file   Social Determinants of Health   Financial Resource Strain: Not on file  Food Insecurity: Food Insecurity Present (10/17/2021)   Hunger Vital Sign    Worried About Running Out of Food in the  Last Year: Often true    Ran Out of Food in the Last Year: Sometimes true  Transportation Needs: No Transportation Needs (11/16/2021)   PRAPARE - Administrator, Civil Service (Medical): No    Lack of Transportation (Non-Medical): No  Recent Concern: Transportation Needs - Unmet Transportation Needs (11/16/2021)   PRAPARE - Transportation    Lack of Transportation (Medical): Yes    Lack of Transportation (Non-Medical): Yes  Physical Activity: Not on file  Stress: Not on file  Social Connections: Not on file  Intimate Partner Violence: Not on file    Physical Exam      Future Appointments  Date Time Provider Department Center  01/30/2022 10:40 AM Bensimhon, Bevelyn Buckles, MD [facility] None       [name], EMT-Paramedic [phone] [facility] Paramedic  12/20/21

## 2021-12-27 ENCOUNTER — Other Ambulatory Visit (HOSPITAL_COMMUNITY): Payer: Self-pay | Admitting: Emergency Medicine

## 2021-12-27 NOTE — Progress Notes (Addendum)
Paramedicine Encounter    Patient ID: Jeremy Powers, male    DOB: 02/22/1956, 66 y.o.   MRN: [id number]   BP 102/60 (BP Location: Left Arm, Patient Position: Sitting, Cuff Size: Normal)   Pulse 60   Resp 16   Wt 122 lb 6.4 oz (55.5 kg)   SpO2 96%   BMI 19.17 kg/m  Weight yesterday-not taken Last visit weight-124.6lb  ATF Mr. Roseboom A&O x 4, skin W&D w/ good color.  Pt. Denies chest pain or SOB.  No edema noted.  Lung sounds with ronchi throughout.  He says sometimes he coughs up thick secretions.  He is still doing well with taking his medicines and his family continues to help him be successful with this.  I set up two weeks worth of meds for him and again reviewed how to take them with Mr. Antonini and his family. Home visit complete.  Patient Care Team: Saguier, Kateri Mc as PCP - General (Internal Medicine) [name] Bevelyn Buckles, MD as PCP - Cardiology (Cardiology)  Patient Active Problem List   Diagnosis Date Noted   Malnutrition of moderate degree 09/23/2021   Dyslipidemia 09/21/2021   Anemia 09/21/2021   AKI (acute kidney injury) (HCC) 09/21/2021   Essential hypertension 09/20/2021   CHF (congestive heart failure) (HCC) 05/08/2021   Acute on chronic systolic CHF (congestive heart failure) (HCC) 05/06/2021   Mitral regurgitation 12/26/2020   COVID-19 virus infection 12/24/2020   CHF exacerbation (HCC) 12/23/2020   Acute on chronic systolic HF (heart failure) (HCC) 06/24/2018   Aortic insufficiency 06/22/2018   Acute on chronic congestive heart failure (HCC) 06/22/2018   Elevated troponin 06/22/2018   CAD (coronary artery disease) 06/22/2018   Left bundle branch block 06/22/2018   History of iron deficiency anemia 06/22/2018   Acute on chronic heart failure (HCC) 06/22/2018   Chronic bilateral low back pain without sciatica 12/12/2015   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   Iron deficiency anemia 03/28/2015   Hyperlipidemia 10/12/2014    Current  Outpatient Medications:    aspirin 81 MG EC tablet, Take 1 tablet (81 mg total) by mouth daily. Swallow whole., Disp: 30 tablet, Rfl: 0   atorvastatin (LIPITOR) 40 MG tablet, TAKE ONE TABLET BY MOUTH EVERYDAY TABLET 6PM FOR CHOLESTEROL, Disp: 30 tablet, Rfl: 2   carvedilol (COREG) 3.125 MG tablet, Take 1 tablet (3.125 mg total) by mouth 2 (two) times daily., Disp: 60 tablet, Rfl: 0   digoxin (LANOXIN) 0.125 MG tablet, Take 1 tablet (0.125 mg total) by mouth daily., Disp: 30 tablet, Rfl: 0   FARXIGA 10 MG TABS tablet, TAKE ONE TABLET BY MOUTH DAILY IN THE MORNING BEFORE BREAKFAST, Disp: 30 tablet, Rfl: 2   ferrous sulfate 325 (65 FE) MG tablet, TAKE ONE TABLET BY MOUTH TWO TIMES A DAY WITH MEALS, Disp: 180 tablet, Rfl: 7   furosemide (LASIX) 40 MG tablet, Take 1 tablet (40 mg total) by mouth daily., Disp: 90 tablet, Rfl: 3   potassium chloride SA (KLOR-CON M) 20 MEQ tablet, Take 1 tablet (20 mEq total) by mouth daily., Disp: 30 tablet, Rfl: 3   sacubitril-valsartan (ENTRESTO) 97-103 MG, Take 1 tablet by mouth 2 (two) times daily., Disp: 60 tablet, Rfl: 3   sertraline (ZOLOFT) 25 MG tablet, Take 1 tablet (25 mg total) by mouth daily., Disp: 30 tablet, Rfl: 5   spironolactone (ALDACTONE) 25 MG tablet, Take 1 tablet (25 mg total) by mouth at bedtime., Disp: 30 tablet, Rfl: 0 No Known Allergies  Social History   Socioeconomic History   Marital status: Single    Spouse name: Not on file   Number of children: Not on file   Years of education: Not on file   Highest education level: Not on file  Occupational History   Not on file  Tobacco Use   Smoking status: Former    Packs/day: 0.50    Types: Cigarettes   Smokeless tobacco: Never  Vaping Use   Vaping Use: Never used  Substance and Sexual Activity   Alcohol use: Yes    Comment: daily   Drug use: Yes    Types: Marijuana   Sexual activity: Not on file  Other Topics Concern   Not on file  Social History Narrative   Not on file    Social Determinants of Health   Financial Resource Strain: Not on file  Food Insecurity: Food Insecurity Present (10/17/2021)   Hunger Vital Sign    Worried About Running Out of Food in the Last Year: Often true    Ran Out of Food in the Last Year: Sometimes true  Transportation Needs: No Transportation Needs (11/16/2021)   PRAPARE - Administrator, Civil Service (Medical): No    Lack of Transportation (Non-Medical): No  Recent Concern: Transportation Needs - Unmet Transportation Needs (11/16/2021)   PRAPARE - Transportation    Lack of Transportation (Medical): Yes    Lack of Transportation (Non-Medical): Yes  Physical Activity: Not on file  Stress: Not on file  Social Connections: Not on file  Intimate Partner Violence: Not on file    Physical Exam      Future Appointments  Date Time Provider Department Center  01/30/2022 10:40 AM Bensimhon, Bevelyn Buckles, MD [facility] None       [name], EMT-Paramedic [phone] [facility] Paramedic  01/10/22

## 2022-01-02 ENCOUNTER — Telehealth: Payer: Self-pay | Admitting: Medical

## 2022-01-02 NOTE — Telephone Encounter (Signed)
Left message for patient to call back and schedule Medicare Annual Wellness Visit (AWV).   Please offer to do virtually or by telephone.  Left office number and my jabber [phone].  Last AWV: 12/20/2019  Please schedule at anytime with Nurse Health Advisor.

## 2022-01-10 ENCOUNTER — Other Ambulatory Visit (HOSPITAL_COMMUNITY): Payer: Self-pay | Admitting: Emergency Medicine

## 2022-01-10 NOTE — Progress Notes (Signed)
Paramedicine Encounter    Patient ID: Jeremy Powers, male    DOB: 12-17-1955, 66 y.o.   MRN: [id number]   BP 110/60 (BP Location: Right Arm, Patient Position: Sitting, Cuff Size: Normal)   Pulse 72   Resp 16   Wt 128 lb (58.1 kg)   SpO2 96%   BMI 20.05 kg/m  Weight yesterday-not taken Last visit weight-122lb  ATF Mr. Gladden A&O x 4, skin W&D w/ good color.  He denies chest pain or SOB.  No edema to his extremities. Lung sounds clear and equal bilat.   He admits to getting off track with his medicines last week and was about 50% compliant.  He states he knows he needs to do better and I discussed scheduling a specific time in the a.m. and p.m. to help him stick to  a routine. Med box reconciled w/o inicident.  Mr. Edelen is up 6lb in weight from his last visit.  No obvious signs of fluid overload increase in weight could be related to his non-compliance with his meds.  He will work to get back on track with meds.    He complains today of some pain and swelling to his left upper thigh area.  It is obviously larger than his rt leg. I called his PCP and scheduled an appointment for 9/1 @ 2:40 to be seen for same.  Told him if his pain gets worse before his scheduled appointment to seek evaluation at urgent care or ER.  Discussed with family to make arrangements for transportation and they agreed to make sure he makes it to his appointment.    No further needs today.  Home visit complete.   [name], EMT-Paramedic [phone] 01/10/2022  Patient Care Team: [name] as PCP - General (Internal Medicine) [name] Bevelyn Buckles, MD as PCP - Cardiology (Cardiology)  Patient Active Problem List   Diagnosis Date Noted   Malnutrition of moderate degree 09/23/2021   Dyslipidemia 09/21/2021   Anemia 09/21/2021   AKI (acute kidney injury) (HCC) 09/21/2021   Essential hypertension 09/20/2021   CHF (congestive heart failure) (HCC) 05/08/2021   Acute on chronic systolic CHF (congestive  heart failure) (HCC) 05/06/2021   Mitral regurgitation 12/26/2020   COVID-19 virus infection 12/24/2020   CHF exacerbation (HCC) 12/23/2020   Acute on chronic systolic HF (heart failure) (HCC) 06/24/2018   Aortic insufficiency 06/22/2018   Acute on chronic congestive heart failure (HCC) 06/22/2018   Elevated troponin 06/22/2018   CAD (coronary artery disease) 06/22/2018   Left bundle branch block 06/22/2018   History of iron deficiency anemia 06/22/2018   Acute on chronic heart failure (HCC) 06/22/2018   Chronic bilateral low back pain without sciatica 12/12/2015   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   Iron deficiency anemia 03/28/2015   Hyperlipidemia 10/12/2014    Current Outpatient Medications:    aspirin 81 MG EC tablet, Take 1 tablet (81 mg total) by mouth daily. Swallow whole., Disp: 30 tablet, Rfl: 0   atorvastatin (LIPITOR) 40 MG tablet, TAKE ONE TABLET BY MOUTH EVERYDAY TABLET 6PM FOR CHOLESTEROL, Disp: 30 tablet, Rfl: 2   carvedilol (COREG) 3.125 MG tablet, Take 1 tablet (3.125 mg total) by mouth 2 (two) times daily., Disp: 60 tablet, Rfl: 0   digoxin (LANOXIN) 0.125 MG tablet, Take 1 tablet (0.125 mg total) by mouth daily., Disp: 30 tablet, Rfl: 0   FARXIGA 10 MG TABS tablet, TAKE ONE TABLET BY MOUTH DAILY IN THE MORNING BEFORE BREAKFAST, Disp: 30  tablet, Rfl: 2   ferrous sulfate 325 (65 FE) MG tablet, TAKE ONE TABLET BY MOUTH TWO TIMES A DAY WITH MEALS, Disp: 180 tablet, Rfl: 7   furosemide (LASIX) 40 MG tablet, Take 1 tablet (40 mg total) by mouth daily., Disp: 90 tablet, Rfl: 3   potassium chloride SA (KLOR-CON M) 20 MEQ tablet, Take 1 tablet (20 mEq total) by mouth daily., Disp: 30 tablet, Rfl: 3   sacubitril-valsartan (ENTRESTO) 97-103 MG, Take 1 tablet by mouth 2 (two) times daily., Disp: 60 tablet, Rfl: 3   sertraline (ZOLOFT) 25 MG tablet, Take 1 tablet (25 mg total) by mouth daily., Disp: 30 tablet, Rfl: 5   spironolactone (ALDACTONE) 25 MG tablet, Take  1 tablet (25 mg total) by mouth at bedtime., Disp: 30 tablet, Rfl: 0 No Known Allergies    Social History   Socioeconomic History   Marital status: Single    Spouse name: Not on file   Number of children: Not on file   Years of education: Not on file   Highest education level: Not on file  Occupational History   Not on file  Tobacco Use   Smoking status: Former    Packs/day: 0.50    Types: Cigarettes   Smokeless tobacco: Never  Vaping Use   Vaping Use: Never used  Substance and Sexual Activity   Alcohol use: Yes    Comment: daily   Drug use: Yes    Types: Marijuana   Sexual activity: Not on file  Other Topics Concern   Not on file  Social History Narrative   Not on file   Social Determinants of Health   Financial Resource Strain: Not on file  Food Insecurity: Food Insecurity Present (10/17/2021)   Hunger Vital Sign    Worried About Running Out of Food in the Last Year: Often true    Ran Out of Food in the Last Year: Sometimes true  Transportation Needs: No Transportation Needs (11/16/2021)   PRAPARE - Administrator, Civil Service (Medical): No    Lack of Transportation (Non-Medical): No  Recent Concern: Transportation Needs - Unmet Transportation Needs (11/16/2021)   PRAPARE - Transportation    Lack of Transportation (Medical): Yes    Lack of Transportation (Non-Medical): Yes  Physical Activity: Not on file  Stress: Not on file  Social Connections: Not on file  Intimate Partner Violence: Not on file    Physical Exam      Future Appointments  Date Time Provider Department Center  01/19/2022  2:40 PM [name] [facility]  01/30/2022 10:40 AM Bensimhon, Bevelyn Buckles, MD [facility] None       [name], EMT-Paramedic [phone] [facility] Paramedic  01/10/22

## 2022-01-17 ENCOUNTER — Other Ambulatory Visit: Payer: Self-pay | Admitting: Family Medicine

## 2022-01-17 ENCOUNTER — Other Ambulatory Visit (HOSPITAL_COMMUNITY): Payer: Self-pay | Admitting: Emergency Medicine

## 2022-01-17 ENCOUNTER — Ambulatory Visit
Admission: RE | Admit: 2022-01-17 | Discharge: 2022-01-17 | Disposition: A | Discharge status: Home / Self Care | Payer: Medicare Other | Source: Ambulatory Visit | Attending: Family Medicine | Admitting: Family Medicine

## 2022-01-17 DIAGNOSIS — M25562 Pain in left knee: Secondary | ICD-10-CM

## 2022-01-17 NOTE — Progress Notes (Signed)
Pt not home during this visit.  Family allow me do reconcile his meds as pt was gone to doctor for issues w/ his left upper leg.  Med box reconciled to reflect 1/2 tab of 49-51 to equal 24/26   Pt's med list reflects Sherryll Burger 97/103 with a notation that he is taking 1/2 tab of 49/51.  Sent message to HF Triage to get dosage changed to 24/26 and received message that his dosage of Entresto should be 97/103. All other meds reconciled as listed. Will reach out tomorrow to clarify this dosing.  Visit complete.    [name], EMT-Paramedic [phone] 01/17/2022

## 2022-01-18 ENCOUNTER — Telehealth (HOSPITAL_COMMUNITY): Payer: Self-pay | Admitting: *Deleted

## 2022-01-18 MED ORDER — ENTRESTO 49-51 MG PO TABS: 0.5000 | ORAL_TABLET | Freq: Two times a day (BID) | ORAL | 3 refills | Status: DC

## 2022-01-18 NOTE — Telephone Encounter (Signed)
[name], community paramedic, contacted office regarding discrepancy on med list: entresto listed as 97/103 mg BID, however pt has been taking 1/2 of the 49/51 mg tab BID (total dose 24/26 mg BID)  Upon review of chart, there were many med discrepancies identified 11/07/21 during [name]'s home visit and medications were changed per Prince Rome, NP:  Pt had not actually been taking any Entresto and so he was started at 24/26 mg BID, instead of increasing to 97/103 mg. Pt already had 49/51 mg tabs at home, [name] was advised to have pt take 1/2 tab BID which is what pt has been doing since that time, however med list was never updated to reflect this change. Med list updated.

## 2022-01-19 ENCOUNTER — Ambulatory Visit: Payer: Medicare Other | Admitting: Medical

## 2022-01-24 ENCOUNTER — Other Ambulatory Visit (HOSPITAL_COMMUNITY): Payer: Self-pay | Admitting: Emergency Medicine

## 2022-01-24 NOTE — Progress Notes (Signed)
Paramedicine Encounter    Patient ID: Jeremy Powers, male    DOB: 10-07-1955, 66 y.o.   MRN: [id number]   BP 120/60 (BP Location: Left Arm, Patient Position: Sitting)   Pulse 74   Resp 16   Wt 130 lb (59 kg)   SpO2 96%   BMI 20.36 kg/m  Weight yesterday-not taken Last visit weight-128lb  ATF Jeremy Powers A&O x 4, skin W&D w/ good color.  Pt denies chest or SOB.  Lung sounds are his normal for him w/ ronchi throughout.  He says he coughs up a little phlegm but he says that's normal.  Jeremy Powers reports that he hasn't been feeling well for the past several days and says his appetite has improved.  He has not done well in taking and I discussed with pt and family the need to get back on track.  Jeremy Powers reports that he has not been feeling well for several days.  He denies pain of any kind.  No nausea, vomiting or diarrhea.  He denies dizziness or weakness and could not be specific in how "he felt bad."  But he says today he's feeling better.   Reminded pt of [facility] appointment with Dr. [name] on 9/12 and gave his stepdaughter phone number to [facility] [phone]) to make arrangements for his ride to his appointment and she advised she would do same. Med box reconciled without incident.  Home visit complete.   Patient Care Team: Saguier, Kateri Mc as PCP - General (Internal Medicine) [name] Bevelyn Buckles, MD as PCP - Cardiology (Cardiology)  Patient Active Problem List   Diagnosis Date Noted   Malnutrition of moderate degree 09/23/2021   Dyslipidemia 09/21/2021   Anemia 09/21/2021   AKI (acute kidney injury) (HCC) 09/21/2021   Essential hypertension 09/20/2021   CHF (congestive heart failure) (HCC) 05/08/2021   Acute on chronic systolic CHF (congestive heart failure) (HCC) 05/06/2021   Mitral regurgitation 12/26/2020   COVID-19 virus infection 12/24/2020   CHF exacerbation (HCC) 12/23/2020   Acute on chronic systolic HF (heart failure) (HCC) 06/24/2018    Aortic insufficiency 06/22/2018   Acute on chronic congestive heart failure (HCC) 06/22/2018   Elevated troponin 06/22/2018   CAD (coronary artery disease) 06/22/2018   Left bundle branch block 06/22/2018   History of iron deficiency anemia 06/22/2018   Acute on chronic heart failure (HCC) 06/22/2018   Chronic bilateral low back pain without sciatica 12/12/2015   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   Iron deficiency anemia 03/28/2015   Hyperlipidemia 10/12/2014    Current Outpatient Medications:    aspirin 81 MG EC tablet, Take 1 tablet (81 mg total) by mouth daily. Swallow whole., Disp: 30 tablet, Rfl: 0   atorvastatin (LIPITOR) 40 MG tablet, TAKE ONE TABLET BY MOUTH EVERYDAY TABLET 6PM FOR CHOLESTEROL, Disp: 30 tablet, Rfl: 2   carvedilol (COREG) 3.125 MG tablet, Take 1 tablet (3.125 mg total) by mouth 2 (two) times daily., Disp: 60 tablet, Rfl: 0   digoxin (LANOXIN) 0.125 MG tablet, Take 1 tablet (0.125 mg total) by mouth daily., Disp: 30 tablet, Rfl: 0   FARXIGA 10 MG TABS tablet, TAKE ONE TABLET BY MOUTH DAILY IN THE MORNING BEFORE BREAKFAST, Disp: 30 tablet, Rfl: 2   ferrous sulfate 325 (65 FE) MG tablet, TAKE ONE TABLET BY MOUTH TWO TIMES A DAY WITH MEALS, Disp: 180 tablet, Rfl: 7   furosemide (LASIX) 40 MG tablet, Take 1 tablet (40 mg total) by mouth daily., Disp:  90 tablet, Rfl: 3   potassium chloride SA (KLOR-CON M) 20 MEQ tablet, Take 1 tablet (20 mEq total) by mouth daily., Disp: 30 tablet, Rfl: 3   sacubitril-valsartan (ENTRESTO) 49-51 MG, Take 0.5 tablets by mouth 2 (two) times daily., Disp: 60 tablet, Rfl: 3   sertraline (ZOLOFT) 25 MG tablet, Take 1 tablet (25 mg total) by mouth daily., Disp: 30 tablet, Rfl: 5   spironolactone (ALDACTONE) 25 MG tablet, Take 1 tablet (25 mg total) by mouth at bedtime., Disp: 30 tablet, Rfl: 0 No Known Allergies    Social History   Socioeconomic History   Marital status: Single    Spouse name: Not on file   Number of  children: Not on file   Years of education: Not on file   Highest education level: Not on file  Occupational History   Not on file  Tobacco Use   Smoking status: Former    Packs/day: 0.50    Types: Cigarettes   Smokeless tobacco: Never  Vaping Use   Vaping Use: Never used  Substance and Sexual Activity   Alcohol use: Yes    Comment: daily   Drug use: Yes    Types: Marijuana   Sexual activity: Not on file  Other Topics Concern   Not on file  Social History Narrative   Not on file   Social Determinants of Health   Financial Resource Strain: Not on file  Food Insecurity: Food Insecurity Present (10/17/2021)   Hunger Vital Sign    Worried About Running Out of Food in the Last Year: Often true    Ran Out of Food in the Last Year: Sometimes true  Transportation Needs: No Transportation Needs (11/16/2021)   PRAPARE - Administrator, Civil Service (Medical): No    Lack of Transportation (Non-Medical): No  Recent Concern: Transportation Needs - Unmet Transportation Needs (11/16/2021)   PRAPARE - Transportation    Lack of Transportation (Medical): Yes    Lack of Transportation (Non-Medical): Yes  Physical Activity: Not on file  Stress: Not on file  Social Connections: Not on file  Intimate Partner Violence: Not on file    Physical Exam      Future Appointments  Date Time Provider Department Center  01/30/2022 10:40 AM Bensimhon, Bevelyn Buckles, MD [facility] None       [name], EMT-Paramedic [phone] [facility] Paramedic  01/24/22

## 2022-01-26 ENCOUNTER — Telehealth (HOSPITAL_COMMUNITY): Payer: Self-pay | Admitting: Emergency Medicine

## 2022-01-26 NOTE — Telephone Encounter (Signed)
Called and spoke to [facility] to confirm that she was able to get Jeremy Powers transportation for his appointment on 9/12 @ 10:40 at the [facility].  She confirmed that same was set up and he would be able to make his appointment.

## 2022-01-30 ENCOUNTER — Telehealth (HOSPITAL_COMMUNITY): Payer: Self-pay | Admitting: Emergency Medicine

## 2022-01-30 ENCOUNTER — Encounter (HOSPITAL_COMMUNITY): Payer: Medicare Other | Admitting: Internal Medicine

## 2022-01-30 NOTE — Telephone Encounter (Signed)
Called Jeremy Powers from the [facility] Clinic.  His transportation did not show up.  Cancelled his appointment.  Rescheduled for Oct. 3 @ 3:20 w/ Dr. Jones Broom.  I will call and schedule transportation with [facility].

## 2022-01-30 NOTE — Telephone Encounter (Signed)
Called and spoke with Jeremy Powers this morning to remind him of his appointment today @ 10:40. Reminded him to bring his meds and pill box and he said he would do same.    [name], EMT-Paramedic [phone] 01/30/2022

## 2022-01-31 ENCOUNTER — Other Ambulatory Visit (HOSPITAL_COMMUNITY): Payer: Self-pay | Admitting: Emergency Medicine

## 2022-01-31 ENCOUNTER — Other Ambulatory Visit (HOSPITAL_COMMUNITY): Payer: Self-pay | Admitting: Family Medicine

## 2022-01-31 NOTE — Progress Notes (Signed)
Paramedicine Encounter    Patient ID: Jeremy Powers, male    DOB: [birth year], 66 y.o.   MRN: [id number]   BP 130/68 (BP Location: Right Arm, Patient Position: Sitting, Cuff Size: Normal)   Pulse 76   Resp 16   Wt 132 lb 12.8 oz (60.2 kg)   SpO2 94%   BMI 20.80 kg/m  Weight yesterday-not taken Last visit weight-130lb  ATF Jeremy Powers A&O x 4, skin W&D w/ good color.  He denies chest pain or SOB.  Lung sounds clear and equal bilat.  He has been compliant with all his meds this visit.  Pill box reconciled.  I advised Jeremy Powers since he missed his last office visit with Dr. Jones Broom that I rescheduled him for Oct 3rd at 3:20 and I will contact [place] [employer] transportation and schedule his ride - he agrees to same.  Refill for his Sertraline to be called in to [facility]. Home visit complete.    [name], EMT-Paramedic [phone] 01/31/2022  Patient Care Team: [name] as PCP - General (Internal Medicine) [name] Bevelyn Buckles, MD as PCP - Cardiology (Cardiology)  Patient Active Problem List   Diagnosis Date Noted   Malnutrition of moderate degree 09/23/2021   Dyslipidemia 09/21/2021   Anemia 09/21/2021   AKI (acute kidney injury) (HCC) 09/21/2021   Essential hypertension 09/20/2021   CHF (congestive heart failure) (HCC) 05/08/2021   Acute on chronic systolic CHF (congestive heart failure) (HCC) 05/06/2021   Mitral regurgitation 12/26/2020   COVID-19 virus infection 12/24/2020   CHF exacerbation (HCC) 12/23/2020   Acute on chronic systolic HF (heart failure) (HCC) 06/24/2018   Aortic insufficiency 06/22/2018   Acute on chronic congestive heart failure (HCC) 06/22/2018   Elevated troponin 06/22/2018   CAD (coronary artery disease) 06/22/2018   Left bundle branch block 06/22/2018   History of iron deficiency anemia 06/22/2018   Acute on chronic heart failure (HCC) 06/22/2018   Chronic bilateral low back pain without sciatica 12/12/2015    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   Iron deficiency anemia 03/28/2015   Hyperlipidemia 10/12/2014    Current Outpatient Medications:    aspirin 81 MG EC tablet, Take 1 tablet (81 mg total) by mouth daily. Swallow whole., Disp: 30 tablet, Rfl: 0   atorvastatin (LIPITOR) 40 MG tablet, TAKE ONE TABLET BY MOUTH EVERYDAY TABLET 6PM FOR CHOLESTEROL, Disp: 30 tablet, Rfl: 2   carvedilol (COREG) 3.125 MG tablet, Take 1 tablet (3.125 mg total) by mouth 2 (two) times daily., Disp: 60 tablet, Rfl: 0   digoxin (LANOXIN) 0.125 MG tablet, TAKE ONE TABLET BY MOUTH DAILY, Disp: 30 tablet, Rfl: 5   FARXIGA 10 MG TABS tablet, TAKE ONE TABLET BY MOUTH DAILY IN THE MORNING BEFORE BREAKFAST, Disp: 30 tablet, Rfl: 2   ferrous sulfate 325 (65 FE) MG tablet, TAKE ONE TABLET BY MOUTH TWO TIMES A DAY WITH MEALS, Disp: 180 tablet, Rfl: 7   furosemide (LASIX) 40 MG tablet, Take 1 tablet (40 mg total) by mouth daily., Disp: 90 tablet, Rfl: 3   potassium chloride SA (KLOR-CON M) 20 MEQ tablet, Take 1 tablet (20 mEq total) by mouth daily., Disp: 30 tablet, Rfl: 3   sacubitril-valsartan (ENTRESTO) 49-51 MG, Take 0.5 tablets by mouth 2 (two) times daily., Disp: 60 tablet, Rfl: 3   sertraline (ZOLOFT) 25 MG tablet, Take 1 tablet (25 mg total) by mouth daily., Disp: 30 tablet, Rfl: 5   spironolactone (ALDACTONE) 25 MG tablet, Take 1  tablet (25 mg total) by mouth at bedtime., Disp: 30 tablet, Rfl: 0 No Known Allergies    Social History   Socioeconomic History   Marital status: Single    Spouse name: Not on file   Number of children: Not on file   Years of education: Not on file   Highest education level: Not on file  Occupational History   Not on file  Tobacco Use   Smoking status: Former    Packs/day: 0.50    Types: Cigarettes   Smokeless tobacco: Never  Vaping Use   Vaping Use: Never used  Substance and Sexual Activity   Alcohol use: Yes    Comment: daily   Drug use: Yes    Types: Marijuana    Sexual activity: Not on file  Other Topics Concern   Not on file  Social History Narrative   Not on file   Social Determinants of Health   Financial Resource Strain: Not on file  Food Insecurity: Food Insecurity Present (10/17/2021)   Hunger Vital Sign    Worried About Running Out of Food in the Last Year: Often true    Ran Out of Food in the Last Year: Sometimes true  Transportation Needs: No Transportation Needs (11/16/2021)   PRAPARE - Administrator, Civil Service (Medical): No    Lack of Transportation (Non-Medical): No  Recent Concern: Transportation Needs - Unmet Transportation Needs (11/16/2021)   PRAPARE - Transportation    Lack of Transportation (Medical): Yes    Lack of Transportation (Non-Medical): Yes  Physical Activity: Not on file  Stress: Not on file  Social Connections: Not on file  Intimate Partner Violence: Not on file    Physical Exam      Future Appointments  Date Time Provider Department Center  02/20/2022  3:20 PM Bensimhon, Bevelyn Buckles, MD [facility] None       [name], EMT-Paramedic [phone] [facility] Paramedic  01/31/22

## 2022-02-01 ENCOUNTER — Telehealth (HOSPITAL_COMMUNITY): Payer: Self-pay | Admitting: Cardiology

## 2022-02-01 MED ORDER — ENTRESTO 24-26 MG PO TABS: 1.0000 | ORAL_TABLET | Freq: Two times a day (BID) | ORAL | 11 refills | Status: DC

## 2022-02-01 NOTE — Telephone Encounter (Signed)
-----   Message from [name], EMT sent at 01/31/2022  9:43 PM EDT ----- Regarding: Sherryll Burger Mr. Mcdowell needs a prescription for [employer] 24-26 sent to [facility].  Vernia Buff, EMT-Paramedic [phone] 01/31/2022

## 2022-02-01 NOTE — Telephone Encounter (Signed)
Updated script sent.

## 2022-02-06 ENCOUNTER — Telehealth (HOSPITAL_COMMUNITY): Payer: Self-pay | Admitting: Emergency Medicine

## 2022-02-06 NOTE — Telephone Encounter (Signed)
Successfully scheduled transportation for Mr. Helton's 02/20/22 appointment @ 3:20.  They will call him the day before his appointment to let him know what time they will arrive to pick him up and provide him with information on his return ride home.    [name], [place] 02/06/2022

## 2022-02-07 ENCOUNTER — Other Ambulatory Visit (HOSPITAL_COMMUNITY): Payer: Self-pay | Admitting: Emergency Medicine

## 2022-02-07 NOTE — Progress Notes (Signed)
Paramedicine Encounter    Patient ID: Jeremy Powers, male    DOB: [birth year], 66 y.o.   MRN: [id number]   There were no vitals taken for this visit. Weight yesterday-not taken Last visit weight-132lb  ATF Mr. Candelaria A&O x4, skin W&D w/ good color.  Mr. Bruso reports to be feeling the best he's felt in a long time.   He denies chest pain or SOB.  Lung sounds clear and equal bilat.  No edema noted.  Med box reconciled without incident.  Refills called in for Pot Cl and Sertraline.  Sertraline needs refills and pharmacy will send message to doctor for refill.   Reviewed upcoming appointment at the clinic on the 3rd.  I have scheduled transportation for him and advised him they will be calling him the night before to let him know when they would arrive to pick him up. Home visit complete.    [name], [place] 02/07/2022   Patient Care Team: Elise Benne as PCP - General (Internal Medicine) Haroldine Laws Shaune Pascal, MD as PCP - Cardiology (Cardiology)  Patient Active Problem List   Diagnosis Date Noted   Malnutrition of moderate degree 09/23/2021   Dyslipidemia 09/21/2021   Anemia 09/21/2021   AKI (acute kidney injury) ([place]) 09/21/2021   Essential hypertension 09/20/2021   CHF (congestive heart failure) ([place]) 05/08/2021   Acute on chronic systolic CHF (congestive heart failure) ([place]) 05/06/2021   Mitral regurgitation 12/26/2020   COVID-19 virus infection 12/24/2020   CHF exacerbation ([place]) 12/23/2020   Acute on chronic systolic HF (heart failure) ([place]) 06/24/2018   Aortic insufficiency 06/22/2018   Acute on chronic congestive heart failure ([place]) 06/22/2018   Elevated troponin 06/22/2018   CAD (coronary artery disease) 06/22/2018   Left bundle branch block 06/22/2018   History of iron deficiency anemia 06/22/2018   Acute on chronic heart failure ([place]) 06/22/2018   Chronic bilateral low back pain without sciatica 12/12/2015   Presence of aortocoronary  bypass graft 12/12/2015   Iron deficiency anemia 03/28/2015   Hyperlipidemia 10/12/2014    Current Outpatient Medications:    aspirin 81 MG EC tablet, Take 1 tablet (81 mg total) by mouth daily. Swallow whole., Disp: 30 tablet, Rfl: 0   atorvastatin (LIPITOR) 40 MG tablet, TAKE ONE TABLET BY MOUTH EVERYDAY TABLET 6PM FOR CHOLESTEROL, Disp: 30 tablet, Rfl: 2   carvedilol (COREG) 3.125 MG tablet, Take 1 tablet (3.125 mg total) by mouth 2 (two) times daily., Disp: 60 tablet, Rfl: 0   digoxin (LANOXIN) 0.125 MG tablet, TAKE ONE TABLET BY MOUTH DAILY, Disp: 30 tablet, Rfl: 5   FARXIGA 10 MG TABS tablet, TAKE ONE TABLET BY MOUTH DAILY IN THE MORNING BEFORE BREAKFAST, Disp: 30 tablet, Rfl: 2   ferrous sulfate 325 (65 FE) MG tablet, TAKE ONE TABLET BY MOUTH TWO TIMES A DAY WITH MEALS, Disp: 180 tablet, Rfl: 7   furosemide (LASIX) 40 MG tablet, Take 1 tablet (40 mg total) by mouth daily., Disp: 90 tablet, Rfl: 3   potassium chloride SA (KLOR-CON M) 20 MEQ tablet, Take 1 tablet (20 mEq total) by mouth daily., Disp: 30 tablet, Rfl: 3   sacubitril-valsartan (ENTRESTO) 24-26 MG, Take 1 tablet by mouth 2 (two) times daily., Disp: 60 tablet, Rfl: 11   sertraline (ZOLOFT) 25 MG tablet, Take 1 tablet (25 mg total) by mouth daily., Disp: 30 tablet, Rfl: 5   spironolactone (ALDACTONE) 25 MG tablet, Take 1 tablet (25 mg total) by mouth at bedtime., Disp: 30 tablet,  Rfl: 0 No Known Allergies    Social History   Socioeconomic History   Marital status: Single    Spouse name: Not on file   Number of children: Not on file   Years of education: Not on file   Highest education level: Not on file  Occupational History   Not on file  Tobacco Use   Smoking status: Former    Packs/day: 0.50    Types: Cigarettes   Smokeless tobacco: Never  Vaping Use   Vaping Use: Never used  Substance and Sexual Activity   Alcohol use: Yes    Comment: daily   Drug use: Yes    Types: Marijuana   Sexual activity: Not on  file  Other Topics Concern   Not on file  Social History Narrative   Not on file   Social Determinants of Health   Financial Resource Strain: Not on file  Food Insecurity: Food Insecurity Present (10/17/2021)   Hunger Vital Sign    Worried About Running Out of Food in the Last Year: Often true    Ran Out of Food in the Last Year: Sometimes true  Transportation Needs: No Transportation Needs (11/16/2021)   PRAPARE - Hydrologist (Medical): No    Lack of Transportation (Non-Medical): No  Recent Concern: Transportation Needs - Unmet Transportation Needs (11/16/2021)   PRAPARE - Transportation    Lack of Transportation (Medical): Yes    Lack of Transportation (Non-Medical): Yes  Physical Activity: Not on file  Stress: Not on file  Social Connections: Not on file  Intimate Partner Violence: Not on file    Physical Exam      Future Appointments  Date Time Provider [place]  02/20/2022  3:20 PM Bensimhon, Shaune Pascal, MD [place] None       [name], [place] Paramedic  02/07/22

## 2022-02-13 ENCOUNTER — Other Ambulatory Visit (HOSPITAL_COMMUNITY): Payer: Self-pay | Admitting: Emergency Medicine

## 2022-02-13 NOTE — Progress Notes (Signed)
Paramedicine Encounter    Patient ID: Jeremy Powers, male    DOB: 1956-03-09, 66 y.o.   MRN: [id number]   BP 136/64 (BP Location: Right Arm, Patient Position: Standing, Cuff Size: Normal)   Pulse 82   Resp 16   Wt 132 lb 3.2 oz (60 kg)   SpO2 96%   BMI 20.71 kg/m  Weight yesterday-not taken Last visit weight-132lb  ATF Mr. Rudd A&O x 4, skin W&D w/ good color.  Pt reports to be feeling well today.  He denies chest pain or SOB.  No edema noted.  He has been walking his dog around his apartment complex and says he feels good doing same.  He only missed 1 day of his meds for the week.  He is out of his Sertraline and pharmacy is sending a request for refill from his PCP.  [name] has an appointment for 10/3 @ 3:20 w/ Bensimhon and transporation has been scheduled.  Home visit complete.    [name], [place] 02/13/2022  Patient Care Team: Elise Benne as PCP - General (Internal Medicine) Haroldine Laws Shaune Pascal, MD as PCP - Cardiology (Cardiology)  Patient Active Problem List   Diagnosis Date Noted   Malnutrition of moderate degree 09/23/2021   Dyslipidemia 09/21/2021   Anemia 09/21/2021   AKI (acute kidney injury) ([place]) 09/21/2021   Essential hypertension 09/20/2021   CHF (congestive heart failure) ([place]) 05/08/2021   Acute on chronic systolic CHF (congestive heart failure) ([place]) 05/06/2021   Mitral regurgitation 12/26/2020   COVID-19 virus infection 12/24/2020   CHF exacerbation ([place]) 12/23/2020   Acute on chronic systolic HF (heart failure) ([place]) 06/24/2018   Aortic insufficiency 06/22/2018   Acute on chronic congestive heart failure ([place]) 06/22/2018   Elevated troponin 06/22/2018   CAD (coronary artery disease) 06/22/2018   Left bundle branch block 06/22/2018   History of iron deficiency anemia 06/22/2018   Acute on chronic heart failure ([place]) 06/22/2018   Chronic bilateral low back pain without sciatica 12/12/2015   Presence of aortocoronary  bypass graft 12/12/2015   Iron deficiency anemia 03/28/2015   Hyperlipidemia 10/12/2014    Current Outpatient Medications:    aspirin 81 MG EC tablet, Take 1 tablet (81 mg total) by mouth daily. Swallow whole., Disp: 30 tablet, Rfl: 0   atorvastatin (LIPITOR) 40 MG tablet, TAKE ONE TABLET BY MOUTH EVERYDAY TABLET 6PM FOR CHOLESTEROL, Disp: 30 tablet, Rfl: 2   carvedilol (COREG) 3.125 MG tablet, Take 1 tablet (3.125 mg total) by mouth 2 (two) times daily., Disp: 60 tablet, Rfl: 0   digoxin (LANOXIN) 0.125 MG tablet, TAKE ONE TABLET BY MOUTH DAILY, Disp: 30 tablet, Rfl: 5   FARXIGA 10 MG TABS tablet, TAKE ONE TABLET BY MOUTH DAILY IN THE MORNING BEFORE BREAKFAST, Disp: 30 tablet, Rfl: 2   ferrous sulfate 325 (65 FE) MG tablet, TAKE ONE TABLET BY MOUTH TWO TIMES A DAY WITH MEALS, Disp: 180 tablet, Rfl: 7   furosemide (LASIX) 40 MG tablet, Take 1 tablet (40 mg total) by mouth daily., Disp: 90 tablet, Rfl: 3   potassium chloride SA (KLOR-CON M) 20 MEQ tablet, Take 1 tablet (20 mEq total) by mouth daily., Disp: 30 tablet, Rfl: 3   sacubitril-valsartan (ENTRESTO) 24-26 MG, Take 1 tablet by mouth 2 (two) times daily., Disp: 60 tablet, Rfl: 11   sertraline (ZOLOFT) 25 MG tablet, Take 1 tablet (25 mg total) by mouth daily., Disp: 30 tablet, Rfl: 5   spironolactone (ALDACTONE) 25 MG tablet,  Take 1 tablet (25 mg total) by mouth at bedtime., Disp: 30 tablet, Rfl: 0 No Known Allergies    Social History   Socioeconomic History   Marital status: Single    Spouse name: Not on file   Number of children: Not on file   Years of education: Not on file   Highest education level: Not on file  Occupational History   Not on file  Tobacco Use   Smoking status: Former    Packs/day: 0.50    Types: Cigarettes   Smokeless tobacco: Never  Vaping Use   Vaping Use: Never used  Substance and Sexual Activity   Alcohol use: Yes    Comment: daily   Drug use: Yes    Types: Marijuana   Sexual activity: Not on  file  Other Topics Concern   Not on file  Social History Narrative   Not on file   Social Determinants of Health   Financial Resource Strain: Not on file  Food Insecurity: Food Insecurity Present (10/17/2021)   Hunger Vital Sign    Worried About Running Out of Food in the Last Year: Often true    Ran Out of Food in the Last Year: Sometimes true  Transportation Needs: No Transportation Needs (11/16/2021)   PRAPARE - Hydrologist (Medical): No    Lack of Transportation (Non-Medical): No  Recent Concern: Transportation Needs - Unmet Transportation Needs (11/16/2021)   PRAPARE - Transportation    Lack of Transportation (Medical): Yes    Lack of Transportation (Non-Medical): Yes  Physical Activity: Not on file  Stress: Not on file  Social Connections: Not on file  Intimate Partner Violence: Not on file    Physical Exam      Future Appointments  Date Time Provider [place]  02/20/2022  3:20 PM Bensimhon, Shaune Pascal, MD [place] None       [name], [place] Paramedic  02/13/22

## 2022-02-15 ENCOUNTER — Other Ambulatory Visit (HOSPITAL_COMMUNITY): Payer: Self-pay | Admitting: Internal Medicine

## 2022-02-20 ENCOUNTER — Ambulatory Visit (HOSPITAL_COMMUNITY)
Admission: RE | Admit: 2022-02-20 | Discharge: 2022-02-20 | Disposition: A | Discharge status: Home / Self Care | Payer: Medicare Other | Source: Ambulatory Visit | Attending: Internal Medicine | Admitting: Internal Medicine

## 2022-02-20 VITALS — BP 138/72 | HR 79 | Wt 137.5 lb

## 2022-02-20 DIAGNOSIS — R2242 Localized swelling, mass and lump, left lower limb: Secondary | ICD-10-CM | POA: Insufficient documentation

## 2022-02-20 DIAGNOSIS — I5022 Chronic systolic (congestive) heart failure: Secondary | ICD-10-CM | POA: Diagnosis present

## 2022-02-20 DIAGNOSIS — Z8616 Personal history of COVID-19: Secondary | ICD-10-CM | POA: Insufficient documentation

## 2022-02-20 DIAGNOSIS — Z7982 Long term (current) use of aspirin: Secondary | ICD-10-CM | POA: Insufficient documentation

## 2022-02-20 DIAGNOSIS — D509 Iron deficiency anemia, unspecified: Secondary | ICD-10-CM | POA: Insufficient documentation

## 2022-02-20 DIAGNOSIS — I351 Nonrheumatic aortic (valve) insufficiency: Secondary | ICD-10-CM | POA: Insufficient documentation

## 2022-02-20 DIAGNOSIS — Z87891 Personal history of nicotine dependence: Secondary | ICD-10-CM | POA: Diagnosis not present

## 2022-02-20 DIAGNOSIS — Z7984 Long term (current) use of oral hypoglycemic drugs: Secondary | ICD-10-CM | POA: Diagnosis not present

## 2022-02-20 DIAGNOSIS — I2510 Atherosclerotic heart disease of native coronary artery without angina pectoris: Secondary | ICD-10-CM | POA: Insufficient documentation

## 2022-02-20 DIAGNOSIS — I5082 Biventricular heart failure: Secondary | ICD-10-CM | POA: Diagnosis not present

## 2022-02-20 DIAGNOSIS — I447 Left bundle-branch block, unspecified: Secondary | ICD-10-CM | POA: Insufficient documentation

## 2022-02-20 DIAGNOSIS — Z79899 Other long term (current) drug therapy: Secondary | ICD-10-CM | POA: Diagnosis not present

## 2022-02-20 DIAGNOSIS — I110 Hypertensive heart disease with heart failure: Secondary | ICD-10-CM | POA: Insufficient documentation

## 2022-02-20 DIAGNOSIS — I428 Other cardiomyopathies: Secondary | ICD-10-CM | POA: Diagnosis not present

## 2022-02-20 DIAGNOSIS — Z951 Presence of aortocoronary bypass graft: Secondary | ICD-10-CM | POA: Insufficient documentation

## 2022-02-20 LAB — BASIC METABOLIC PANEL
Anion gap: 5 (ref 5–15)
BUN: 12 mg/dL (ref 8–23)
CO2: 24 mmol/L (ref 22–32)
Calcium: 8.2 mg/dL — ABNORMAL LOW (ref 8.9–10.3)
Chloride: 106 mmol/L (ref 98–111)
Creatinine, Ser: 0.81 mg/dL (ref 0.61–1.24)
GFR, Estimated: >60 mL/min (ref >60)
Glucose, Bld: 133 mg/dL — ABNORMAL HIGH (ref 70–99)
Potassium: 3.9 mmol/L (ref 3.5–5.1)
Sodium: 135 mmol/L (ref 135–145)

## 2022-02-20 LAB — DIGOXIN LEVEL: Digoxin Level: 0.2 ng/mL — ABNORMAL LOW (ref 0.8–2.0)

## 2022-02-20 LAB — BRAIN NATRIURETIC PEPTIDE: B Natriuretic Peptide: 1979.3 pg/mL — ABNORMAL HIGH (ref 0.0–100.0)

## 2022-02-20 NOTE — Patient Instructions (Signed)
Labs done today, your results will be available in MyChart, we will contact you for abnormal readings.  You have been referred to EP to discuss possible pacemaker or defibrillator  Please follow-up with your Primary Care Provider for knee issue  Your physician recommends that you schedule a follow-up appointment in: 3 months  If you have any questions or concerns before your next appointment please send [place] a message through [place] [facility] [place] or call our office at [phone].    TO LEAVE A MESSAGE FOR THE NURSE SELECT OPTION 2, PLEASE LEAVE A MESSAGE INCLUDING: YOUR NAME DATE OF BIRTH CALL BACK NUMBER REASON FOR CALL**this is important as we prioritize the call backs  YOU WILL RECEIVE A CALL BACK THE SAME DAY AS LONG AS YOU CALL BEFORE 4:00 PM  At the [facility], you and your health needs are our priority. As part of our continuing mission to provide you with exceptional heart care, we have created designated Provider Care Teams. These Care Teams include your primary Cardiologist (physician) and Advanced Practice Providers (APPs- Physician Assistants and Nurse Practitioners) who all work together to provide you with the care you need, when you need it.   You may see any of the following providers on your designated Care Team at your next follow up: Dr Glori Bickers Dr Loralie Champagne Dr. Roxana Hires, NP Lyda Jester, [place] [facility] [place], [place] Forestine Na, NP Audry Riles, PharmD   Please be sure to bring in all your medications bottles to every appointment.

## 2022-02-20 NOTE — Progress Notes (Signed)
Advanced Heart Failure Clinic Note  Date:  02/20/2022   PCP:  Mackie Pai, PA-C  Cardiologist:  Glori Bickers, MD Primary HF: Dr. Haroldine Laws  Chief Complaint: HF Follow up   History of Present Illness: Jeremy Powers is a 66 y.o.male CAD s/p PCI to LAD in 2015 and 2016, s/p CABG 8182, systolic HF EF 99-37%, microcytic/iron deficiency anemia followed by hematology and GI, LBBB, and tobacco use. Illiterate and cannot read.   In 2/20 he presented to [facility] ED with SOB, cough, and orthopnea in setting of med non-compliance. Was unable to afford copays with Medicaid. He was tachycardic and tachypneic on arrival, and was transferred to [facility] 06/22/18 for further evaluation. Echo showed newly decreased EF of 25-30% from 50-55% in 12/2017.  Underwent R/LHC showing severe 1 v disease with occluded LAD within previous stent. RCA and LCX normal. LIMA to LAD and SVG to D2 patent. Cath also showed low filling pressures with moderately low cardiac output. Meds adjusted as tolerated.   OV 04/05/20 returns for HF follow up after not being seen in > 1 year. Followed by HF Paramedicine. Lasix was stopped & Wilder Glade was started.  Echo 2/22 LVEF 20-25%, RV ok.    3/22 he was discharged from community paramedicine program and bubble pill packs arranged.   Seen in the ED 10/25/20 for CHF exacerbation in the setting of medication non-compliance. He was diuresed with IV lasix and discharged home. Sent to the ED 12/23/20 by his PCP and found to be A/C CHF and COVID +. Repeat echo EF 10-15%. Diuresed 13 lbs, beta blocker stopped with acute decompensation, digoxin added, and Entresto held with low BP.  Admitted 12/22 with A/C CHF after stopping his medications a month prior. Echo showed EF 20-25%. He was diuresed with IV lasix and GDMT restarted.   Out of HF meds at follow up, volume up. Meds restarted and paramedicine arranged.  Admitted 5/23 with a/c CHF, due to non-compliance with medications. Diuresed with IV  lasix. Hospitalization complicated by AKI. GDMT restarted after SCr improved. Discharged home, weight 138 lbs.  Today he returns for HF follow up with Paramedic DeDee Says he has been doing really wel recently.   Cardiac Studies: - Echo (12/22): EF 20-25%, global HK, grade II DD, RV moderately reduced, moderate MR  - Echo (8/22): EF 10-15%, Repeat echo showed EF 10-15%, severe global hypokinesis with septal akinesis, LV severely dilated, grade II DD, RV fxn moderate to severely reduced, moderate to severe MR, moderate to severe AI.   - Echo (2/22): EF 20-25%, RV ok   - Echo (9/20): EF 20-25%   - Echo (2/20): EF 20-25%   - cMRI (06/27/18): EF 24%. Normal RV   - R/LHC (2/20) RA = 1 RV = 19/3 PA = 24/5 (14) PCW = 7 Fick cardiac output/index = 4.0/2.5 PVR = 1.9 WU Ao sat = 95% PA sat = 68%, 71%   Assessment: 1. Severe 1-vessel CAD with occluded LAD within previous stent 2. Normal RCA and LCX 3. LIMA to LAD widely patent 4. SVG - D2 widely patent 5. SVG - D1 likely occluded (grafts not marked)  ROS: All systems negative except as listed in HPI, PMH and Problem List.  Past Medical History:  Diagnosis Date   CHF (congestive heart failure) (HCC)    Cirrhosis ([place])    Coronary artery disease    Hypertension    Pancreatitis    Past Surgical History:  Procedure Laterality Date  CARDIAC SURGERY     RIGHT/LEFT HEART CATH AND CORONARY/GRAFT ANGIOGRAPHY N/A 06/24/2018   Procedure: RIGHT/LEFT HEART CATH AND CORONARY/GRAFT ANGIOGRAPHY;  Surgeon: Jolaine Artist, MD;  Location: [place] [facility];  Service: Cardiovascular;  Laterality: N/A;   Current Outpatient Medications  Medication Sig Dispense Refill   aspirin 81 MG EC tablet Take 1 tablet (81 mg total) by mouth daily. Swallow whole. 30 tablet 0   atorvastatin (LIPITOR) 40 MG tablet TAKE ONE TABLET BY MOUTH EVERYDAY TABLET 6PM FOR CHOLESTEROL 30 tablet 1   carvedilol (COREG) 3.125 MG tablet Take 1 tablet (3.125 mg total) by  mouth 2 (two) times daily. 60 tablet 0   digoxin (LANOXIN) 0.125 MG tablet TAKE ONE TABLET BY MOUTH DAILY 30 tablet 5   FARXIGA 10 MG TABS tablet TAKE ONE TABLET BY MOUTH DAILY IN THE MORNING BEFORE BREAKFAST 30 tablet 1   ferrous sulfate 325 (65 FE) MG tablet TAKE ONE TABLET BY MOUTH TWO TIMES A DAY WITH MEALS 180 tablet 7   furosemide (LASIX) 40 MG tablet Take 1 tablet (40 mg total) by mouth daily. 90 tablet 3   potassium chloride SA (KLOR-CON M) 20 MEQ tablet Take 1 tablet (20 mEq total) by mouth daily. 30 tablet 3   sacubitril-valsartan (ENTRESTO) 24-26 MG Take 1 tablet by mouth 2 (two) times daily. 60 tablet 11   sertraline (ZOLOFT) 25 MG tablet Take 1 tablet (25 mg total) by mouth daily. 30 tablet 5   spironolactone (ALDACTONE) 25 MG tablet Take 1 tablet (25 mg total) by mouth at bedtime. 30 tablet 0   No current facility-administered medications for this encounter.   Allergies:   Patient has no known allergies.   Social History:  The patient  reports that he has quit smoking. His smoking use included cigarettes. He smoked an average of .5 packs per day. He has never used smokeless tobacco. He reports current alcohol use. He reports current drug use. Drug: Marijuana.   Family History:  The patient's family history is not on file.   ROS:  Please see the history of present illness.   All other systems are personally reviewed and negative.   Recent Labs: 06/02/2021: ALT 11; Pro B Natriuretic peptide (BNP) 2,394.0 09/20/2021: Hemoglobin 12.2; Platelets 188 09/24/2021: Magnesium 1.5 11/07/2021: B Natriuretic Peptide 2,293.7; BUN 12; Creatinine, Ser 0.70; Potassium 3.5; Sodium 136  Personally reviewed   BP 138/72   Pulse 79   Wt 62.4 kg (137 lb 8.6 oz)   SpO2 99%   BMI 21.54 kg/m   Wt Readings from Last 3 Encounters:  02/20/22 62.4 kg (137 lb 8.6 oz)  02/13/22 60 kg (132 lb 3.2 oz)  02/07/22 58.2 kg (128 lb 6.4 oz)   Physical Exam: General:  Well appearing. No resp  difficulty HEENT: normal Poor dentition Neck: supple. no JVD. Carotids 2+ bilat; no bruits. No lymphadenopathy or thryomegaly appreciated. Cor: PMI nondisplaced. Regular rate & rhythm. No rubs, gallops or murmurs. Lungs: clear Abdomen: soft, nontender, nondistended. No hepatosplenomegaly. No bruits or masses. Good bowel sounds. Extremities: no cyanosis, clubbing, rash, edema possible lipoma on left knee Neuro: alert & orientedx3, cranial nerves grossly intact. moves all 4 extremities w/o difficulty. Affect pleasant  ASSESSMENT AND PLAN: 1.  Chronic biventricular heart failure, NICM - Echo (2/20): EF 25-30%,   - cMRI (2/20) with EF 24% - Echo (9/20): EF 20-25% - Echo (2/22): EF 20-25% RV ok - Echo (8/22): EF 10-15%, moderately to severely reduced RV function, moderate  to severe MR, moderate to severe AI. - Echo (12/22): EF 20-25%, global HK, grade II DD, RV moderately reduced, moderate MR - Previously referred to EP for consideration of CRT-D with wide LBBB but didn't follow up. - Much improved with Paramedicine help. NYHA II - Continue Entresto to 264/26 mg bid. BP too low to titrate - Continue Lasix 40 mg daily and KCL 20 daily. - Continue digoxin 0.125 mg daily. Dig level 0.2 on 10/17/21. - Continue carvedilol 3.125 mg bid. - Continue Farxiga 10 mg daily. - Continue spiro 25 mg daily. - He is not a candidate for advanced therapies. - Repeat echo. Refer for CRT-P/D  2. CAD - S/P CABG 2017, LHC 06/2018  with occluded LAD normal LCX and RCA. LIMA to LAD and SVG to D2 patent (SVG to D1 occluded) - No s/s angina - Continue aspirin 81 mg daily + atorvastatin.      3. LBBB - QRS 192 ms - Previously referred to EP for consideration of CRT-D with wide LBBB but didn't follow up.   4. Valvular heart disease - Moderate MR and mild AI on echo 12/22. LVIDd 7.1 cm - Will follow. - repeat echo    5. h/o tobacco use - No longer smoking.  - Lives with people who do smoke.  6. ETOH use -  Drinks 1 beer/day. (6-pack per week) - Discussed cutting back/stopping.   7. HTN - Stable. - GDMT as above   8. Anemia - Iron deficient. On oral Fe. - Followed by PCP.  9. Illiterate/SDOH - Cannot read. Able to write numbers. Needs assistance with medications.    - Continue w/ paramedicine. I will notify [name] of med changes today.   Signed, Glori Bickers, MD  02/20/2022 3:37 PM  [place] [address] [facility] [place] [address] [phone] (office) [phone] (fax)

## 2022-03-06 ENCOUNTER — Other Ambulatory Visit (HOSPITAL_COMMUNITY): Payer: Self-pay | Admitting: Emergency Medicine

## 2022-03-06 ENCOUNTER — Telehealth (HOSPITAL_COMMUNITY): Payer: Self-pay | Admitting: Emergency Medicine

## 2022-03-06 NOTE — Progress Notes (Signed)
Paramedicine Encounter    Patient ID: Jeremy Powers, male    DOB: 1956/02/17, 66 y.o.   MRN: [id number]   BP (!) 140/78 (BP Location: Right Arm, Patient Position: Sitting, Cuff Size: Normal)   Pulse 80   Resp 16   Wt 138 lb (62.6 kg)   SpO2 96%   BMI 21.61 kg/m  Weight yesterday-not taken Last visit weight-132lbs  ATF Jeremy Powers A&Ox 4, skin W&D w/ good color.  Pt. Has no complaints of chest pain or SOB.  Lung sound with ronchi throughout which is baseline for him.  He has no edema noted.  He is up 6 lbs on his weight but does not show any signs of fluid overload.  He looks great!  He did miss  a couple days of his evening meds.  We discussed this and he advised he would work hard to get back on track.   Meds reconciled for 2 weeks.  I'm going to get his set up for bubble packs from [employer]. Today, Jeremy Powers advised he's been having nose bleeds for the past few days.  He denies headache and has no obvious neurological deficits.  Advised him to keep track of the nosebleeds.  His blood pressure was elevated today but he has not taken his medications yet this morning. Home visit complete.    [name], [place] 03/06/2022   Patient Care Team: Elise Benne as PCP - General (Internal Medicine) Haroldine Laws Shaune Pascal, MD as PCP - Cardiology (Cardiology)  Patient Active Problem List   Diagnosis Date Noted   Malnutrition of moderate degree 09/23/2021   Dyslipidemia 09/21/2021   Anemia 09/21/2021   AKI (acute kidney injury) ([place]) 09/21/2021   Essential hypertension 09/20/2021   CHF (congestive heart failure) ([place]) 05/08/2021   Acute on chronic systolic CHF (congestive heart failure) ([place]) 05/06/2021   Mitral regurgitation 12/26/2020   COVID-19 virus infection 12/24/2020   CHF exacerbation ([place]) 12/23/2020   Acute on chronic systolic HF (heart failure) ([place]) 06/24/2018   Aortic insufficiency 06/22/2018   Acute on chronic congestive heart  failure ([place]) 06/22/2018   Elevated troponin 06/22/2018   CAD (coronary artery disease) 06/22/2018   Left bundle branch block 06/22/2018   History of iron deficiency anemia 06/22/2018   Acute on chronic heart failure ([place]) 06/22/2018   Chronic bilateral low back pain without sciatica 12/12/2015   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   Iron deficiency anemia 03/28/2015   Hyperlipidemia 10/12/2014    Current Outpatient Medications:    aspirin 81 MG EC tablet, Take 1 tablet (81 mg total) by mouth daily. Swallow whole., Disp: 30 tablet, Rfl: 0   atorvastatin (LIPITOR) 40 MG tablet, TAKE ONE TABLET BY MOUTH EVERYDAY TABLET 6PM FOR CHOLESTEROL, Disp: 30 tablet, Rfl: 1   carvedilol (COREG) 3.125 MG tablet, Take 1 tablet (3.125 mg total) by mouth 2 (two) times daily., Disp: 60 tablet, Rfl: 0   digoxin (LANOXIN) 0.125 MG tablet, TAKE ONE TABLET BY MOUTH DAILY, Disp: 30 tablet, Rfl: 5   FARXIGA 10 MG TABS tablet, TAKE ONE TABLET BY MOUTH DAILY IN THE MORNING BEFORE BREAKFAST, Disp: 30 tablet, Rfl: 1   ferrous sulfate 325 (65 FE) MG tablet, TAKE ONE TABLET BY MOUTH TWO TIMES A DAY WITH MEALS, Disp: 180 tablet, Rfl: 7   furosemide (LASIX) 40 MG tablet, Take 1 tablet (40 mg total) by mouth daily., Disp: 90 tablet, Rfl: 3   potassium chloride SA (KLOR-CON M) 20 MEQ  tablet, Take 1 tablet (20 mEq total) by mouth daily., Disp: 30 tablet, Rfl: 3   sacubitril-valsartan (ENTRESTO) 24-26 MG, Take 1 tablet by mouth 2 (two) times daily., Disp: 60 tablet, Rfl: 11   spironolactone (ALDACTONE) 25 MG tablet, Take 1 tablet (25 mg total) by mouth at bedtime., Disp: 30 tablet, Rfl: 0   sertraline (ZOLOFT) 25 MG tablet, Take 1 tablet (25 mg total) by mouth daily. (Patient not taking: Reported on 03/06/2022), Disp: 30 tablet, Rfl: 5 No Known Allergies    Social History   Socioeconomic History   Marital status: Single    Spouse name: Not on file   Number of children: Not on file   Years of education:  Not on file   Highest education level: Not on file  Occupational History   Not on file  Tobacco Use   Smoking status: Former    Packs/day: 0.50    Types: Cigarettes   Smokeless tobacco: Never  Vaping Use   Vaping Use: Never used  Substance and Sexual Activity   Alcohol use: Yes    Comment: daily   Drug use: Yes    Types: Marijuana   Sexual activity: Not on file  Other Topics Concern   Not on file  Social History Narrative   Not on file   Social Determinants of Health   Financial Resource Strain: Not on file  Food Insecurity: Food Insecurity Present (10/17/2021)   Hunger Vital Sign    Worried About Running Out of Food in the Last Year: Often true    Ran Out of Food in the Last Year: Sometimes true  Transportation Needs: No Transportation Needs (11/16/2021)   PRAPARE - Administrator, Civil Service (Medical): No    Lack of Transportation (Non-Medical): No  Recent Concern: Transportation Needs - Unmet Transportation Needs (11/16/2021)   PRAPARE - Transportation    Lack of Transportation (Medical): Yes    Lack of Transportation (Non-Medical): Yes  Physical Activity: Not on file  Stress: Not on file  Social Connections: Not on file  Intimate Partner Violence: Not on file    Physical Exam      Future Appointments  Date Time Provider Department Center  03/20/2022  9:00 AM Marinus Maw, MD [facility]  05/23/2022  9:30 AM [facility] PA/NP [facility] None       [name], EMT-Paramedic [phone] [facility] Paramedic  03/06/22

## 2022-03-06 NOTE — Telephone Encounter (Signed)
Called and spoke to Mr. Finfrock regarding his upcoming appointments.  He thought he had missed an appointment when I saw him for a home visit this morning.  I don't see any appointment missed at least anything I can see in Epic.  He does have an appointment on Oct 31st w/ Dr. Crissie Sickles @ 9:30.  He does need transportation set up for this visit. I will try and call for him tomorrow.    [name], [place] 03/06/2022

## 2022-03-15 ENCOUNTER — Telehealth (HOSPITAL_COMMUNITY): Payer: Self-pay | Admitting: Licensed Clinical Social Worker

## 2022-03-15 NOTE — Telephone Encounter (Signed)
HF Paramedicine Team Based Care Meeting  HF MD- NA  HF NP - [place] NP-C   [facility] admit within the last 30 days for heart failure? no  Medications concerns? Compliant with medications with paramedic encouragement but unable to fill pill box himself  Eligible for discharge? Feel as if he needs long term assistance  Jorge Ny, [place] [facility] Desk#: [phone] Cell#: [phone]

## 2022-03-20 ENCOUNTER — Encounter: Payer: Self-pay | Admitting: Internal Medicine

## 2022-03-20 ENCOUNTER — Ambulatory Visit: Payer: Medicare Other | Attending: Internal Medicine | Admitting: Internal Medicine

## 2022-03-20 VITALS — BP 132/60 | HR 77 | Ht 67.0 in | Wt 132.6 lb

## 2022-03-20 DIAGNOSIS — I447 Left bundle-branch block, unspecified: Secondary | ICD-10-CM | POA: Diagnosis not present

## 2022-03-20 DIAGNOSIS — I428 Other cardiomyopathies: Secondary | ICD-10-CM

## 2022-03-20 DIAGNOSIS — I509 Heart failure, unspecified: Secondary | ICD-10-CM

## 2022-03-20 NOTE — Patient Instructions (Addendum)
Medication Instructions:  Your physician recommends that you continue on your current medications as directed. Please refer to the Current Medication list given to you today.  *If you need a refill on your cardiac medications before your next appointment, please call your pharmacy*  Lab Work: YOU WILL HAVE LABS DRAWN THE WEEK OF 04/16/22, CBC AND BMET  Testing/Procedures: None ordered.  Follow-Up: SEE INSTRUCTION LETTER.  ICD BOOKLET PRINTED / GIVEN TO REVIEW.

## 2022-03-20 NOTE — Progress Notes (Signed)
HPI Jeremy Powers is referred by Dr. Dorthea Cove for consideration for Biv ICD Insertion. He is a pleasant 66 yo man with a h/o noncompliance, EF 25%, single vessel CAD with an occluded LAD. He has LBBB and a prior CABG. He has recently become more compliant. He has class 2 symptoms. No h/o syncope. He has been on GDMT.  No Known Allergies   Current Outpatient Medications  Medication Sig Dispense Refill   aspirin 81 MG EC tablet Take 1 tablet (81 mg total) by mouth daily. Swallow whole. 30 tablet 0   atorvastatin (LIPITOR) 40 MG tablet TAKE ONE TABLET BY MOUTH EVERYDAY TABLET 6PM FOR CHOLESTEROL 30 tablet 1   carvedilol (COREG) 3.125 MG tablet Take 1 tablet (3.125 mg total) by mouth 2 (two) times daily. 60 tablet 0   digoxin (LANOXIN) 0.125 MG tablet TAKE ONE TABLET BY MOUTH DAILY 30 tablet 5   FARXIGA 10 MG TABS tablet TAKE ONE TABLET BY MOUTH DAILY IN THE MORNING BEFORE BREAKFAST 30 tablet 1   ferrous sulfate 325 (65 FE) MG tablet TAKE ONE TABLET BY MOUTH TWO TIMES A DAY WITH MEALS 180 tablet 7   furosemide (LASIX) 40 MG tablet Take 1 tablet (40 mg total) by mouth daily. 90 tablet 3   potassium chloride SA (KLOR-CON M) 20 MEQ tablet Take 1 tablet (20 mEq total) by mouth daily. 30 tablet 3   sacubitril-valsartan (ENTRESTO) 24-26 MG Take 1 tablet by mouth 2 (two) times daily. 60 tablet 11   sertraline (ZOLOFT) 25 MG tablet Take 1 tablet (25 mg total) by mouth daily. 30 tablet 5   spironolactone (ALDACTONE) 25 MG tablet Take 1 tablet (25 mg total) by mouth at bedtime. 30 tablet 0   No current facility-administered medications for this visit.     Past Medical History:  Diagnosis Date   CHF (congestive heart failure) (HCC)    Cirrhosis (HCC)    Coronary artery disease    Hypertension    Pancreatitis     ROS:   All systems reviewed and negative except as noted in the HPI.   Past Surgical History:  Procedure Laterality Date   CARDIAC SURGERY     RIGHT/LEFT HEART CATH AND  CORONARY/GRAFT ANGIOGRAPHY N/A 06/24/2018   Procedure: RIGHT/LEFT HEART CATH AND CORONARY/GRAFT ANGIOGRAPHY;  Surgeon: Dolores Patty, MD;  [facility];  Service: Cardiovascular;  Laterality: N/A;     Family History  Problem Relation Age of Onset   Iron deficiency Neg Hx      Social History   Socioeconomic History   Marital status: Single    Spouse name: Not on file   Number of children: Not on file   Years of education: Not on file   Highest education level: Not on file  Occupational History   Not on file  Tobacco Use   Smoking status: Former    Packs/day: 0.50    Types: Cigarettes   Smokeless tobacco: Never  Vaping Use   Vaping Use: Never used  Substance and Sexual Activity   Alcohol use: Yes    Comment: daily   Drug use: Yes    Types: Marijuana   Sexual activity: Not on file  Other Topics Concern   Not on file  Social History Narrative   Not on file   Social Determinants of Health   Financial Resource Strain: Not on file  Food Insecurity: Food Insecurity Present (10/17/2021)   Hunger Vital Sign  Worried About Charity fundraiser in the Last Year: Often true    [place] in the Last Year: Sometimes true  Transportation Needs: No Transportation Needs (11/16/2021)   PRAPARE - Hydrologist (Medical): No    Lack of Transportation (Non-Medical): No  Recent Concern: Transportation Needs - Unmet Transportation Needs (11/16/2021)   PRAPARE - Hydrologist (Medical): Yes    Lack of Transportation (Non-Medical): Yes  Physical Activity: Not on file  Stress: Not on file  Social Connections: Not on file  Intimate Partner Violence: Not on file     BP 132/60   Pulse 77   Ht 5\' 7"  (1.702 m)   Wt 132 lb 9.6 oz (60.1 kg)   SpO2 98%   BMI 20.77 kg/m   Physical Exam:  Well appearing 66 yo man, NAD HEENT: Unremarkable Neck:  No JVD, no thyromegally Lymphatics:  No adenopathy Back:   No CVA tenderness Lungs:  Clear with no wheezes HEART:  Regular rate rhythm, no murmurs, no rubs, no clicks Abd:  soft, positive bowel sounds, no organomegally, no rebound, no guarding Ext:  2 plus pulses, no edema, no cyanosis, no clubbing Skin:  No rashes no nodules Neuro:  CN II through XII intact, motor grossly intact  EKG - nsr with LBBB  Assess/Plan:  Chronic systolic heart failure with LBBB - I have discussed the indications/risks/benefits/goals/expectations of Biv ICD insertion and he wishes to proceed. CAD - he is s/p CABG and denies anginal symptoms.  HTN - his bp is controlled. Continue current meds.  Chronic renal failure - his renal function has recently improved going from stage 2 to 1. We will avoid excess IV dye.  Carleene Overlie Posie Lillibridge,MD

## 2022-03-23 ENCOUNTER — Other Ambulatory Visit (HOSPITAL_COMMUNITY): Payer: Self-pay | Admitting: Emergency Medicine

## 2022-03-25 NOTE — Progress Notes (Signed)
Paramedicine Encounter    Patient ID: Jeremy Powers, male    DOB: [birth year], 66 y.o.   MRN: [id number]   There were no vitals taken for this visit. Weight yesterday-not taken Last visit weight-138lb   ATF Jeremy Powers A&O x 4, skin W&D w/ good color.  This was not a scheduled visit.  I needed collect his pill bottles for bubble packs for [employer].  These were supposed to be done last week but the pharmacy delivered pill bottles instead.  Meds were collected and taken back to the pharmacy and bubble packs will be picked up on Monday.      [name], EMT-Paramedic [phone] 03/25/2022  Patient Care Team: [name] as PCP - General (Internal Medicine) [name] Bevelyn Buckles, MD as PCP - Cardiology (Cardiology)  Patient Active Problem List   Diagnosis Date Noted   NICM (nonischemic cardiomyopathy) (HCC) 03/20/2022   Malnutrition of moderate degree 09/23/2021   Dyslipidemia 09/21/2021   Anemia 09/21/2021   AKI (acute kidney injury) (HCC) 09/21/2021   Essential hypertension 09/20/2021   CHF (congestive heart failure) (HCC) 05/08/2021   Acute on chronic systolic CHF (congestive heart failure) (HCC) 05/06/2021   Mitral regurgitation 12/26/2020   COVID-19 virus infection 12/24/2020   CHF exacerbation (HCC) 12/23/2020   Acute on chronic systolic HF (heart failure) (HCC) 06/24/2018   Aortic insufficiency 06/22/2018   Acute on chronic congestive heart failure (HCC) 06/22/2018   Elevated troponin 06/22/2018   CAD (coronary artery disease) 06/22/2018   Left bundle branch block 06/22/2018   History of iron deficiency anemia 06/22/2018   Acute on chronic heart failure (HCC) 06/22/2018   Chronic bilateral low back pain without sciatica 12/12/2015   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   Iron deficiency anemia 03/28/2015   Hyperlipidemia 10/12/2014    Current Outpatient Medications:    aspirin 81 MG EC tablet, Take 1 tablet (81 mg total) by mouth daily.  Swallow whole., Disp: 30 tablet, Rfl: 0   atorvastatin (LIPITOR) 40 MG tablet, TAKE ONE TABLET BY MOUTH EVERYDAY TABLET 6PM FOR CHOLESTEROL, Disp: 30 tablet, Rfl: 1   carvedilol (COREG) 3.125 MG tablet, Take 1 tablet (3.125 mg total) by mouth 2 (two) times daily., Disp: 60 tablet, Rfl: 0   digoxin (LANOXIN) 0.125 MG tablet, TAKE ONE TABLET BY MOUTH DAILY, Disp: 30 tablet, Rfl: 5   FARXIGA 10 MG TABS tablet, TAKE ONE TABLET BY MOUTH DAILY IN THE MORNING BEFORE BREAKFAST, Disp: 30 tablet, Rfl: 1   ferrous sulfate 325 (65 FE) MG tablet, TAKE ONE TABLET BY MOUTH TWO TIMES A DAY WITH MEALS, Disp: 180 tablet, Rfl: 7   furosemide (LASIX) 40 MG tablet, Take 1 tablet (40 mg total) by mouth daily., Disp: 90 tablet, Rfl: 3   potassium chloride SA (KLOR-CON M) 20 MEQ tablet, Take 1 tablet (20 mEq total) by mouth daily., Disp: 30 tablet, Rfl: 3   sacubitril-valsartan (ENTRESTO) 24-26 MG, Take 1 tablet by mouth 2 (two) times daily., Disp: 60 tablet, Rfl: 11   sertraline (ZOLOFT) 25 MG tablet, Take 1 tablet (25 mg total) by mouth daily., Disp: 30 tablet, Rfl: 5   spironolactone (ALDACTONE) 25 MG tablet, Take 1 tablet (25 mg total) by mouth at bedtime., Disp: 30 tablet, Rfl: 0 No Known Allergies    Social History   Socioeconomic History   Marital status: Single    Spouse name: Not on file   Number of children: Not on file   Years of education:  Not on file   Highest education level: Not on file  Occupational History   Not on file  Tobacco Use   Smoking status: Former    Packs/day: 0.50    Types: Cigarettes   Smokeless tobacco: Never  Vaping Use   Vaping Use: Never used  Substance and Sexual Activity   Alcohol use: Yes    Comment: daily   Drug use: Yes    Types: Marijuana   Sexual activity: Not on file  Other Topics Concern   Not on file  Social History Narrative   Not on file   Social Determinants of Health   Financial Resource Strain: Not on file  Food Insecurity: Food Insecurity  Present (10/17/2021)   Hunger Vital Sign    Worried About Running Out of Food in the Last Year: Often true    Ran Out of Food in the Last Year: Sometimes true  Transportation Needs: No Transportation Needs (11/16/2021)   PRAPARE - Hydrologist (Medical): No    Lack of Transportation (Non-Medical): No  Recent Concern: Transportation Needs - Unmet Transportation Needs (11/16/2021)   PRAPARE - Transportation    Lack of Transportation (Medical): Yes    Lack of Transportation (Non-Medical): Yes  Physical Activity: Not on file  Stress: Not on file  Social Connections: Not on file  Intimate Partner Violence: Not on file    Physical Exam      Future Appointments  Date Time Provider [place]  04/16/2022 10:00 AM [facility]  05/23/2022  9:30 AM [facility] PA/NP [place] None       [name], [place] Paramedic  03/25/22

## 2022-03-27 ENCOUNTER — Other Ambulatory Visit (HOSPITAL_COMMUNITY): Payer: Self-pay | Admitting: Emergency Medicine

## 2022-03-27 NOTE — Progress Notes (Signed)
Paramedicine Encounter    Patient ID: Jeremy Powers, male    DOB: 04-27-1956, 66 y.o.   MRN: [id number]   BP 110/60 (BP Location: Right Arm, Patient Position: Sitting, Cuff Size: Normal)   Pulse 64   Resp 16   Wt 138 lb 9.6 oz (62.9 kg)   SpO2 96%   BMI 21.71 kg/m  Weight yesterday-not taken Last visit weight-133.9lb  ATF Jeremy Powers A&O x 4, skin W&D w/ good color.  Pt. Denies chest pain or SOB.  Lung sounds clear and equal bilat.  No edema noted.  He has done well with his med compliance.  Today, I picked up his bubble packs from [employer].  Reviewed with Jeremy Powers how the bubble packs worked and he was able to demonstrate the proper way to use the bubble packs AM/PM.   Weight is up this visit but no signs fluid overload   Home visit complete.    [name], EMT-Paramedic [phone] 03/27/2022   Patient Care Team: Saguier, Kateri Mc as PCP - General (Internal Medicine) [name], Bevelyn Buckles, MD as PCP - Cardiology (Cardiology)  Patient Active Problem List   Diagnosis Date Noted  . NICM (nonischemic cardiomyopathy) (HCC) 03/20/2022  . Malnutrition of moderate degree 09/23/2021  . Dyslipidemia 09/21/2021  . Anemia 09/21/2021  . AKI (acute kidney injury) (HCC) 09/21/2021  . Essential hypertension 09/20/2021  . CHF (congestive heart failure) (HCC) 05/08/2021  . Acute on chronic systolic CHF (congestive heart failure) (HCC) 05/06/2021  . Mitral regurgitation 12/26/2020  . COVID-19 virus infection 12/24/2020  . CHF exacerbation (HCC) 12/23/2020  . Acute on chronic systolic HF (heart failure) (HCC) 06/24/2018  . Aortic insufficiency 06/22/2018  . Acute on chronic congestive heart failure (HCC) 06/22/2018  . Elevated troponin 06/22/2018  . CAD (coronary artery disease) 06/22/2018  . Left bundle branch block 06/22/2018  . History of iron deficiency anemia 06/22/2018  . Acute on chronic heart failure (HCC) 06/22/2018  . Chronic bilateral low back pain without  sciatica 12/12/2015  . 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  . Iron deficiency anemia 03/28/2015  . Hyperlipidemia 10/12/2014    Current Outpatient Medications:  .  aspirin 81 MG EC tablet, Take 1 tablet (81 mg total) by mouth daily. Swallow whole., Disp: 30 tablet, Rfl: 0 .  atorvastatin (LIPITOR) 40 MG tablet, TAKE ONE TABLET BY MOUTH EVERYDAY TABLET 6PM FOR CHOLESTEROL, Disp: 30 tablet, Rfl: 1 .  carvedilol (COREG) 3.125 MG tablet, Take 1 tablet (3.125 mg total) by mouth 2 (two) times daily., Disp: 60 tablet, Rfl: 0 .  digoxin (LANOXIN) 0.125 MG tablet, TAKE ONE TABLET BY MOUTH DAILY, Disp: 30 tablet, Rfl: 5 .  FARXIGA 10 MG TABS tablet, TAKE ONE TABLET BY MOUTH DAILY IN THE MORNING BEFORE BREAKFAST, Disp: 30 tablet, Rfl: 1 .  ferrous sulfate 325 (65 FE) MG tablet, TAKE ONE TABLET BY MOUTH TWO TIMES A DAY WITH MEALS, Disp: 180 tablet, Rfl: 7 .  furosemide (LASIX) 40 MG tablet, Take 1 tablet (40 mg total) by mouth daily., Disp: 90 tablet, Rfl: 3 .  potassium chloride SA (KLOR-CON M) 20 MEQ tablet, Take 1 tablet (20 mEq total) by mouth daily., Disp: 30 tablet, Rfl: 3 .  sacubitril-valsartan (ENTRESTO) 24-26 MG, Take 1 tablet by mouth 2 (two) times daily., Disp: 60 tablet, Rfl: 11 .  sertraline (ZOLOFT) 25 MG tablet, Take 1 tablet (25 mg total) by mouth daily., Disp: 30 tablet, Rfl: 5 .  spironolactone (ALDACTONE) 25 MG  tablet, Take 1 tablet (25 mg total) by mouth at bedtime., Disp: 30 tablet, Rfl: 0 No Known Allergies    Social History   Socioeconomic History  . Marital status: Single    Spouse name: Not on file  . Number of children: Not on file  . Years of education: Not on file  . Highest education level: Not on file  Occupational History  . Not on file  Tobacco Use  . Smoking status: Former    Packs/day: 0.50    Types: Cigarettes  . Smokeless tobacco: Never  Vaping Use  . Vaping Use: Never used  Substance and Sexual Activity  . Alcohol use: Yes    Comment:  daily  . Drug use: Yes    Types: Marijuana  . Sexual activity: Not on file  Other Topics Concern  . Not on file  Social History Narrative  . Not on file   Social Determinants of Health   Financial Resource Strain: Not on file  Food Insecurity: Food Insecurity Present (10/17/2021)   Hunger Vital Sign   . Worried About Charity fundraiser in the Last Year: Often true   . Ran Out of Food in the Last Year: Sometimes true  Transportation Needs: No Transportation Needs (11/16/2021)   PRAPARE - Transportation   . Lack of Transportation (Medical): No   . Lack of Transportation (Non-Medical): No  Recent Concern: Transportation Needs - Unmet Transportation Needs (11/16/2021)   PRAPARE - Transportation   . Lack of Transportation (Medical): Yes   . Lack of Transportation (Non-Medical): Yes  Physical Activity: Not on file  Stress: Not on file  Social Connections: Not on file  Intimate Partner Violence: Not on file    Physical Exam      Future Appointments  Date Time Provider [place]  04/16/2022 10:00 AM [facility]  05/23/2022  9:30 AM [facility] PA/NP [facility] None       [name], [facility] Paramedic  03/27/22

## 2022-04-03 ENCOUNTER — Other Ambulatory Visit (HOSPITAL_COMMUNITY): Payer: Self-pay | Admitting: Emergency Medicine

## 2022-04-03 NOTE — Progress Notes (Signed)
Paramedicine Encounter    Patient ID: Jeremy Powers, male    DOB: 10/04/1955, 66 y.o.   MRN: [id number]   BP (!) 140/80 (BP Location: Left Arm, Patient Position: Sitting, Cuff Size: Normal)   Pulse 70   Resp 16   Wt 135 lb 12.8 oz (61.6 kg)   SpO2 98%   BMI 21.27 kg/m  Weight yesterday-not taken Last visit weight-138lb  ATF Jeremy Powers A&O x 4, skin W&D w/ good color.  He reports to be feeling good.  Denies chest pain or SOB.  He was packing up the garbage can on a [place] to roll it out to the dumpster when I got there.  His starting his bubble packs today.  I reviewed how to take them with him and with his step son of the home and they seem to understand same.  I advised him I would call him in a couple of days to see how it's going and told him if it was too difficult or confusing for him that we could go back to the pill box and he said he would give the bubble packs a try.  Blood pressure was a little elevated today but he had not yet taken his morning meds.  I will monitor same next visit.  Home visit complete.    [name], EMT-Paramedic [phone] 04/03/2022     Patient Care Team: [name] as PCP - General (Internal Medicine) [name] Bevelyn Buckles, MD as PCP - Cardiology (Cardiology)  Patient Active Problem List   Diagnosis Date Noted   NICM (nonischemic cardiomyopathy) (HCC) 03/20/2022   Malnutrition of moderate degree 09/23/2021   Dyslipidemia 09/21/2021   Anemia 09/21/2021   AKI (acute kidney injury) (HCC) 09/21/2021   Essential hypertension 09/20/2021   CHF (congestive heart failure) (HCC) 05/08/2021   Acute on chronic systolic CHF (congestive heart failure) (HCC) 05/06/2021   Mitral regurgitation 12/26/2020   COVID-19 virus infection 12/24/2020   CHF exacerbation (HCC) 12/23/2020   Acute on chronic systolic HF (heart failure) (HCC) 06/24/2018   Aortic insufficiency 06/22/2018   Acute on chronic congestive heart failure (HCC) 06/22/2018   Elevated  troponin 06/22/2018   CAD (coronary artery disease) 06/22/2018   Left bundle branch block 06/22/2018   History of iron deficiency anemia 06/22/2018   Acute on chronic heart failure (HCC) 06/22/2018   Chronic bilateral low back pain without sciatica 12/12/2015   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   Iron deficiency anemia 03/28/2015   Hyperlipidemia 10/12/2014    Current Outpatient Medications:    aspirin 81 MG EC tablet, Take 1 tablet (81 mg total) by mouth daily. Swallow whole., Disp: 30 tablet, Rfl: 0   atorvastatin (LIPITOR) 40 MG tablet, TAKE ONE TABLET BY MOUTH EVERYDAY TABLET 6PM FOR CHOLESTEROL, Disp: 30 tablet, Rfl: 1   carvedilol (COREG) 3.125 MG tablet, Take 1 tablet (3.125 mg total) by mouth 2 (two) times daily., Disp: 60 tablet, Rfl: 0   digoxin (LANOXIN) 0.125 MG tablet, TAKE ONE TABLET BY MOUTH DAILY, Disp: 30 tablet, Rfl: 5   FARXIGA 10 MG TABS tablet, TAKE ONE TABLET BY MOUTH DAILY IN THE MORNING BEFORE BREAKFAST, Disp: 30 tablet, Rfl: 1   ferrous sulfate 325 (65 FE) MG tablet, TAKE ONE TABLET BY MOUTH TWO TIMES A DAY WITH MEALS, Disp: 180 tablet, Rfl: 7   furosemide (LASIX) 40 MG tablet, Take 1 tablet (40 mg total) by mouth daily., Disp: 90 tablet, Rfl: 3   potassium chloride SA (KLOR-CON  M) 20 MEQ tablet, Take 1 tablet (20 mEq total) by mouth daily., Disp: 30 tablet, Rfl: 3   sacubitril-valsartan (ENTRESTO) 24-26 MG, Take 1 tablet by mouth 2 (two) times daily., Disp: 60 tablet, Rfl: 11   sertraline (ZOLOFT) 25 MG tablet, Take 1 tablet (25 mg total) by mouth daily., Disp: 30 tablet, Rfl: 5   spironolactone (ALDACTONE) 25 MG tablet, Take 1 tablet (25 mg total) by mouth at bedtime., Disp: 30 tablet, Rfl: 0 No Known Allergies    Social History   Socioeconomic History   Marital status: Single    Spouse name: Not on file   Number of children: Not on file   Years of education: Not on file   Highest education level: Not on file  Occupational History   Not  on file  Tobacco Use   Smoking status: Former    Packs/day: 0.50    Types: Cigarettes   Smokeless tobacco: Never  Vaping Use   Vaping Use: Never used  Substance and Sexual Activity   Alcohol use: Yes    Comment: daily   Drug use: Yes    Types: Marijuana   Sexual activity: Not on file  Other Topics Concern   Not on file  Social History Narrative   Not on file   Social Determinants of Health   Financial Resource Strain: Not on file  Food Insecurity: Food Insecurity Present (10/17/2021)   Hunger Vital Sign    Worried About Running Out of Food in the Last Year: Often true    Ran Out of Food in the Last Year: Sometimes true  Transportation Needs: No Transportation Needs (11/16/2021)   PRAPARE - Administrator, Civil Service (Medical): No    Lack of Transportation (Non-Medical): No  Recent Concern: Transportation Needs - Unmet Transportation Needs (11/16/2021)   PRAPARE - Transportation    Lack of Transportation (Medical): Yes    Lack of Transportation (Non-Medical): Yes  Physical Activity: Not on file  Stress: Not on file  Social Connections: Not on file  Intimate Partner Violence: Not on file    Physical Exam      Future Appointments  Date Time Provider Department Center  04/16/2022 10:00 AM [facility]  05/23/2022  9:30 AM [facility] PA/NP [facility] None       [name], EMT-Paramedic [phone] [facility] Paramedic  04/03/22

## 2022-04-16 ENCOUNTER — Ambulatory Visit: Payer: Medicare Other | Attending: Internal Medicine

## 2022-04-16 DIAGNOSIS — I447 Left bundle-branch block, unspecified: Secondary | ICD-10-CM

## 2022-04-16 DIAGNOSIS — I509 Heart failure, unspecified: Secondary | ICD-10-CM

## 2022-04-16 DIAGNOSIS — I428 Other cardiomyopathies: Secondary | ICD-10-CM

## 2022-04-17 LAB — CBC WITH DIFFERENTIAL/PLATELET
Basophils Absolute: 0.1 10*3/uL (ref 0.0–0.2)
Basos: 1 %
EOS (ABSOLUTE): 0.1 10*3/uL (ref 0.0–0.4)
Eos: 3 %
Hematocrit: 38.4 % (ref 37.5–51.0)
Hemoglobin: 13.2 g/dL (ref 13.0–17.7)
Immature Grans (Abs): 0 10*3/uL (ref 0.0–0.1)
Immature Granulocytes: 0 %
Lymphocytes Absolute: 1.4 10*3/uL (ref 0.7–3.1)
Lymphs: 36 %
MCH: 32.7 pg (ref 26.6–33.0)
MCHC: 34.4 g/dL (ref 31.5–35.7)
MCV: 95 fL (ref 79–97)
Monocytes Absolute: 1 10*3/uL — ABNORMAL HIGH (ref 0.1–0.9)
Monocytes: 25 %
Neutrophils Absolute: 1.4 10*3/uL (ref 1.4–7.0)
Neutrophils: 35 %
Platelets: 219 10*3/uL (ref 150–450)
RBC: 4.04 x10E6/uL — ABNORMAL LOW (ref 4.14–5.80)
RDW: 12.3 % (ref 11.6–15.4)
WBC: 4.1 10*3/uL (ref 3.4–10.8)

## 2022-04-17 LAB — BASIC METABOLIC PANEL
BUN/Creatinine Ratio: 12 (ref 10–24)
BUN: 11 mg/dL (ref 8–27)
CO2: 22 mmol/L (ref 20–29)
Calcium: 8.9 mg/dL (ref 8.6–10.2)
Chloride: 103 mmol/L (ref 96–106)
Creatinine, Ser: 0.92 mg/dL (ref 0.76–1.27)
Glucose: 82 mg/dL (ref 70–99)
Potassium: 4.5 mmol/L (ref 3.5–5.2)
Sodium: 141 mmol/L (ref 134–144)
eGFR: 91 mL/min/{1.73_m2} (ref >59)

## 2022-04-19 ENCOUNTER — Telehealth (HOSPITAL_COMMUNITY): Payer: Self-pay | Admitting: Licensed Clinical Social Worker

## 2022-04-19 NOTE — Telephone Encounter (Signed)
H&V Care Navigation CSW Progress Note  Clinical Social Worker informed by [employer] that pt is in need of transportation to procedure on Monday.  Paramedic has ensured with pt and family that he will have a ride home from procedure as he will need a responsible party.  CSW arranged taxi ride through [place] to come to procedure on Monday- pick up for 6:45am as pt is due at the hospital by 7:30am.  Paramedic to pass along ride details to patient.   SDOH Screenings   Food Insecurity: Food Insecurity Present (10/17/2021)  Housing: Low Risk  (12/27/2020)  Transportation Needs: Unmet Transportation Needs (04/19/2022)  Depression (PHQ2-9): Low Risk  (06/07/2020)  Recent Concern: Depression (PHQ2-9) - Medium Risk (04/28/2020)  Tobacco Use: Medium Risk (03/20/2022)   Burna Sis, LCSW Clinical Social Worker [facility] Desk#: [phone] Cell#: [phone]

## 2022-04-20 NOTE — Pre-Procedure Instructions (Signed)
Spoke with caregiver [name],  Instructed patient on the following items: Arrival time 0730 Nothing to eat or drink after midnight No meds AM of procedure Responsible person to drive you home and stay with you for 24 hrs Wash with special soap night before and morning of procedure

## 2022-04-23 ENCOUNTER — Telehealth: Payer: Self-pay

## 2022-04-23 ENCOUNTER — Other Ambulatory Visit (HOSPITAL_COMMUNITY): Payer: Self-pay | Admitting: Internal Medicine

## 2022-04-23 ENCOUNTER — Encounter (HOSPITAL_COMMUNITY): Admission: RE | Payer: Self-pay | Source: Home / Self Care

## 2022-04-23 ENCOUNTER — Ambulatory Visit (HOSPITAL_COMMUNITY): Admission: RE | Admit: 2022-04-23 | Payer: Medicare Other | Source: Home / Self Care | Admitting: Internal Medicine

## 2022-04-23 ENCOUNTER — Telehealth (HOSPITAL_COMMUNITY): Payer: Self-pay | Admitting: Licensed Clinical Social Worker

## 2022-04-23 SURGERY — BIV ICD INSERTION CRT-D

## 2022-04-23 NOTE — Telephone Encounter (Signed)
Attempted to call pt to get him rescheduled for his procedure. No phones listed in his chart are working at this time.  I will continue trying to get in touch with pt to get him rescheduled.

## 2022-04-23 NOTE — Telephone Encounter (Signed)
CSW saw that pt did not make it to procedure this morning.  CSW attempted to call pt to check in but both phone numbers are not working at this time.  CSW called taxi company who was scheduled to pick pt up and they confirmed they attempted to complete ride but that patient was not outside at time of pick up and did not answer the phone.  CSW will continue to follow and can attempt to help with future ride once patient becomes reachable and procedure can be rescheduled  Burna Sis, LCSW Clinical Social Worker [facility] Desk#: [phone] Cell#: [phone]

## 2022-04-25 ENCOUNTER — Telehealth: Payer: Self-pay

## 2022-04-25 DIAGNOSIS — I428 Other cardiomyopathies: Secondary | ICD-10-CM

## 2022-04-25 NOTE — Telephone Encounter (Signed)
Jeremy Powers with his Daughter Rodney Booze to call back to reschedule ICD procedure.

## 2022-04-25 NOTE — Telephone Encounter (Signed)
Pt has been rescheduled for 05/28/22 for his BiV-ICD Implant.  Rodney Booze (step Daughter) is aware of date/time and I will mail new instruction letter to his home.   She will make sure he gets labwork done at [facility] in [place].

## 2022-04-26 ENCOUNTER — Telehealth (HOSPITAL_COMMUNITY): Payer: Self-pay | Admitting: Licensed Clinical Social Worker

## 2022-04-26 NOTE — Telephone Encounter (Signed)
H&V Care Navigation CSW Progress Note  Clinical Social Worker consulted to assist with ride to upcoming procedure on 05/28/21.  Able to schedule [employer] to pick up pt at 8:45am that morning- family has confirmed they can pick pt up from procedure.  CSW informed pt stepdaughter of ride details and she confirmed she will be with him the morning of the appt to remind him.   SDOH Screenings   Food Insecurity: Food Insecurity Present (10/17/2021)  Housing: Low Risk  (12/27/2020)  Transportation Needs: Unmet Transportation Needs (04/26/2022)  Depression (PHQ2-9): Low Risk  (06/07/2020)  Recent Concern: Depression (PHQ2-9) - Medium Risk (04/28/2020)  Tobacco Use: Medium Risk (03/20/2022)   Burna Sis, LCSW Clinical Social Worker [facility] Desk#: [phone] Cell#: [phone]

## 2022-05-01 ENCOUNTER — Other Ambulatory Visit (HOSPITAL_COMMUNITY): Payer: Self-pay | Admitting: Emergency Medicine

## 2022-05-01 NOTE — Progress Notes (Signed)
Paramedicine Encounter    Patient ID: Jeremy Powers, male    DOB: 07-18-1955, 66 y.o.   MRN: [id number]   There were no vitals taken for this visit. Weight yesterday-not taken Last visit weight-135lb  For the first time since I have been doing home visits with Mr. Deller I found him to be obviously intoxicated and admits to drinking this morning.  He has not taken his morning meds and states he does not wish to take them at this time because he's been drinking and "I want this alcohol to wear off,"   Reviewing his medications it appears he's gotten off track with his bubble packs.  I deconstructed two weeks worth of meds back to pill boxes and reviewed with him how to take them.  Today Mr. Shill denies chest pain or SOB and has no obvious edema noted.  I will  do another home visit next week w/ goal being to get him back on track with his meds.  Home visit complete.    [name], EMT-Paramedic [phone] 05/01/2022    Patient Care Team: [name] as PCP - General (Internal Medicine) [name] Bevelyn Buckles, MD as PCP - Cardiology (Cardiology)  Patient Active Problem List   Diagnosis Date Noted   NICM (nonischemic cardiomyopathy) (HCC) 03/20/2022   Malnutrition of moderate degree 09/23/2021   Dyslipidemia 09/21/2021   Anemia 09/21/2021   AKI (acute kidney injury) (HCC) 09/21/2021   Essential hypertension 09/20/2021   CHF (congestive heart failure) (HCC) 05/08/2021   Acute on chronic systolic CHF (congestive heart failure) (HCC) 05/06/2021   Mitral regurgitation 12/26/2020   COVID-19 virus infection 12/24/2020   CHF exacerbation (HCC) 12/23/2020   Acute on chronic systolic HF (heart failure) (HCC) 06/24/2018   Aortic insufficiency 06/22/2018   Acute on chronic congestive heart failure (HCC) 06/22/2018   Elevated troponin 06/22/2018   CAD (coronary artery disease) 06/22/2018   Left bundle branch block 06/22/2018   History of iron deficiency anemia 06/22/2018    Acute on chronic heart failure (HCC) 06/22/2018   Chronic bilateral low back pain without sciatica 12/12/2015   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   Iron deficiency anemia 03/28/2015   Hyperlipidemia 10/12/2014    Current Outpatient Medications:    aspirin 81 MG EC tablet, Take 1 tablet (81 mg total) by mouth daily. Swallow whole., Disp: 30 tablet, Rfl: 0   atorvastatin (LIPITOR) 40 MG tablet, TAKE ONE TABLET BY MOUTH EVERYDAY TABLET 6PM FOR CHOLESTEROL, Disp: 30 tablet, Rfl: 0   carvedilol (COREG) 3.125 MG tablet, Take 1 tablet (3.125 mg total) by mouth 2 (two) times daily., Disp: 60 tablet, Rfl: 0   digoxin (LANOXIN) 0.125 MG tablet, TAKE ONE TABLET BY MOUTH DAILY, Disp: 30 tablet, Rfl: 5   FARXIGA 10 MG TABS tablet, TAKE ONE TABLET BY MOUTH DAILY IN THE MORNING BEFORE BREAKFAST, Disp: 30 tablet, Rfl: 0   ferrous sulfate 325 (65 FE) MG tablet, TAKE ONE TABLET BY MOUTH TWO TIMES A DAY WITH MEALS, Disp: 180 tablet, Rfl: 7   furosemide (LASIX) 40 MG tablet, Take 1 tablet (40 mg total) by mouth daily., Disp: 90 tablet, Rfl: 3   potassium chloride SA (KLOR-CON M) 20 MEQ tablet, Take 1 tablet (20 mEq total) by mouth daily., Disp: 30 tablet, Rfl: 3   sacubitril-valsartan (ENTRESTO) 24-26 MG, Take 1 tablet by mouth 2 (two) times daily., Disp: 60 tablet, Rfl: 11   sertraline (ZOLOFT) 25 MG tablet, Take 1 tablet (25 mg total) by  mouth daily., Disp: 30 tablet, Rfl: 5   spironolactone (ALDACTONE) 25 MG tablet, Take 1 tablet (25 mg total) by mouth at bedtime., Disp: 30 tablet, Rfl: 0 No Known Allergies    Social History   Socioeconomic History   Marital status: Single    Spouse name: Not on file   Number of children: Not on file   Years of education: Not on file   Highest education level: Not on file  Occupational History   Not on file  Tobacco Use   Smoking status: Former    Packs/day: 0.50    Types: Cigarettes   Smokeless tobacco: Never  Vaping Use   Vaping Use: Never  used  Substance and Sexual Activity   Alcohol use: Yes    Comment: daily   Drug use: Yes    Types: Marijuana   Sexual activity: Not on file  Other Topics Concern   Not on file  Social History Narrative   Not on file   Social Determinants of Health   Financial Resource Strain: Not on file  Food Insecurity: Food Insecurity Present (10/17/2021)   Hunger Vital Sign    Worried About Running Out of Food in the Last Year: Often true    Ran Out of Food in the Last Year: Sometimes true  Transportation Needs: Unmet Transportation Needs (04/26/2022)   PRAPARE - Administrator, Civil Service (Medical): Yes    Lack of Transportation (Non-Medical): Yes  Physical Activity: Not on file  Stress: Not on file  Social Connections: Not on file  Intimate Partner Violence: Not on file    Physical Exam      Future Appointments  Date Time Provider Department Center  05/23/2022  9:30 AM [facility] PA/NP [facility] None       [name], EMT-P-Paramedic [phone] [facility] Paramedic  05/01/22

## 2022-05-08 ENCOUNTER — Other Ambulatory Visit (HOSPITAL_COMMUNITY): Payer: Self-pay | Admitting: Emergency Medicine

## 2022-05-08 NOTE — Progress Notes (Signed)
Paramedicine Encounter    Patient ID: Jeremy Powers, male    DOB: 1955/08/22, 66 y.o.   MRN: [id number]   There were no vitals taken for this visit. Weight yesterday-not taken Last visit weight-137lb  ATF [name]&O x 4, skin W&D w/ good color.  He denies chest pain or SOB.  Lung sounds clear and equal bilat and no edema noted to his lower extremities.  Pt. Admits that he has not been doing well with med compliance.  He had gotten off track with his bubble packs last home visit and I had deconstructed bubble packs back into pill boxes.  After today's visit and reconciling meds in pill box x 1 week I have decided to discontinue his bubble packs.  I reached out to [employer] and discussed this with the pharmacist.  [name] meds were out for delivery but had not made it to [name]' residence yet.  Bubble packs will be returned to pharmacy and put back in bottles.  [name] needs refills on ASA, Carvedilol and Spironolactone will reach out to clinic for refills for same.  Home visit complete.    [name], [place] 05/08/2022  Patient Care Team: Elise Benne as PCP - General (Internal Medicine) Haroldine Laws Shaune Pascal, MD as PCP - Cardiology (Cardiology)  Patient Active Problem List   Diagnosis Date Noted   NICM (nonischemic cardiomyopathy) ([place]) 03/20/2022   Malnutrition of moderate degree 09/23/2021   Dyslipidemia 09/21/2021   Anemia 09/21/2021   AKI (acute kidney injury) ([place]) 09/21/2021   Essential hypertension 09/20/2021   CHF (congestive heart failure) ([place]) 05/08/2021   Acute on chronic systolic CHF (congestive heart failure) ([place]) 05/06/2021   Mitral regurgitation 12/26/2020   COVID-19 virus infection 12/24/2020   CHF exacerbation ([place]) 12/23/2020   Acute on chronic systolic HF (heart failure) ([place]) 06/24/2018   Aortic insufficiency 06/22/2018   Acute on chronic congestive heart failure ([place]) 06/22/2018   Elevated troponin 06/22/2018    CAD (coronary artery disease) 06/22/2018   Left bundle branch block 06/22/2018   History of iron deficiency anemia 06/22/2018   Acute on chronic heart failure ([place]) 06/22/2018   Chronic bilateral low back pain without sciatica 12/12/2015   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   Iron deficiency anemia 03/28/2015   Hyperlipidemia 10/12/2014    Current Outpatient Medications:    aspirin 81 MG EC tablet, Take 1 tablet (81 mg total) by mouth daily. Swallow whole., Disp: 30 tablet, Rfl: 0   atorvastatin (LIPITOR) 40 MG tablet, TAKE ONE TABLET BY MOUTH EVERYDAY TABLET 6PM FOR CHOLESTEROL, Disp: 30 tablet, Rfl: 0   carvedilol (COREG) 3.125 MG tablet, Take 1 tablet (3.125 mg total) by mouth 2 (two) times daily., Disp: 60 tablet, Rfl: 0   digoxin (LANOXIN) 0.125 MG tablet, TAKE ONE TABLET BY MOUTH DAILY, Disp: 30 tablet, Rfl: 5   FARXIGA 10 MG TABS tablet, TAKE ONE TABLET BY MOUTH DAILY IN THE MORNING BEFORE BREAKFAST, Disp: 30 tablet, Rfl: 0   ferrous sulfate 325 (65 FE) MG tablet, TAKE ONE TABLET BY MOUTH TWO TIMES Powers DAY WITH MEALS, Disp: 180 tablet, Rfl: 7   furosemide (LASIX) 40 MG tablet, Take 1 tablet (40 mg total) by mouth daily., Disp: 90 tablet, Rfl: 3   potassium chloride SA (KLOR-CON M) 20 MEQ tablet, Take 1 tablet (20 mEq total) by mouth daily., Disp: 30 tablet, Rfl: 3   sacubitril-valsartan (ENTRESTO) 24-26 MG, Take 1 tablet by mouth 2 (two)  times daily., Disp: 60 tablet, Rfl: 11   sertraline (ZOLOFT) 25 MG tablet, Take 1 tablet (25 mg total) by mouth daily., Disp: 30 tablet, Rfl: 5   spironolactone (ALDACTONE) 25 MG tablet, Take 1 tablet (25 mg total) by mouth at bedtime., Disp: 30 tablet, Rfl: 0 No Known Allergies    Social History   Socioeconomic History   Marital status: Single    Spouse name: Not on file   Number of children: Not on file   Years of education: Not on file   Highest education level: Not on file  Occupational History   Not on file  Tobacco Use    Smoking status: Former    Packs/day: 0.50    Types: Cigarettes   Smokeless tobacco: Never  Vaping Use   Vaping Use: Never used  Substance and Sexual Activity   Alcohol use: Yes    Comment: daily   Drug use: Yes    Types: Marijuana   Sexual activity: Not on file  Other Topics Concern   Not on file  Social History Narrative   Not on file   Social Determinants of Health   Financial Resource Strain: Not on file  Food Insecurity: Food Insecurity Present (10/17/2021)   Hunger Vital Sign    Worried About Running Out of Food in the Last Year: Often true    Ran Out of Food in the Last Year: Sometimes true  Transportation Needs: Unmet Transportation Needs (04/26/2022)   PRAPARE - Administrator, Civil Service (Medical): Yes    Lack of Transportation (Non-Medical): Yes  Physical Activity: Not on file  Stress: Not on file  Social Connections: Not on file  Intimate Partner Violence: Not on file    Physical Exam      Future Appointments  Date Time Provider Department Center  05/23/2022  9:30 AM [facility] PA/NP [facility] None       [name], EMT-P-Paramedic [phone] [facility] Paramedic  05/08/22

## 2022-05-08 NOTE — Progress Notes (Unsigned)
Paramedicine Encounter    Patient ID: Jeremy Powers, male    DOB: 1956/02/03, 66 y.o.   MRN: [id number]   BP 120/70 (BP Location: Left Arm, Patient Position: Sitting, Cuff Size: Normal)   Pulse 68   Resp 16   Wt 142 lb 9.6 oz (64.7 kg)   SpO2 97%   BMI 22.33 kg/m  Weight yesterday-not taken Last visit weight-137lb  Patient Care Team: Saguier, Kateri Mc as PCP - General (Internal Medicine) Bensimhon, Bevelyn Buckles, MD as PCP - Cardiology (Cardiology)  Patient Active Problem List   Diagnosis Date Noted  . NICM (nonischemic cardiomyopathy) ([facility]) 03/20/2022  . Malnutrition of moderate degree 09/23/2021  . Dyslipidemia 09/21/2021  . Anemia 09/21/2021  . AKI (acute kidney injury) ([facility]) 09/21/2021  . Essential hypertension 09/20/2021  . CHF (congestive heart failure) ([facility]) 05/08/2021  . Acute on chronic systolic CHF (congestive heart failure) ([facility]) 05/06/2021  . Mitral regurgitation 12/26/2020  . COVID-19 virus infection 12/24/2020  . CHF exacerbation ([facility]) 12/23/2020  . Acute on chronic systolic HF (heart failure) ([facility]) 06/24/2018  . Aortic insufficiency 06/22/2018  . Acute on chronic congestive heart failure ([facility]) 06/22/2018  . Elevated troponin 06/22/2018  . CAD (coronary artery disease) 06/22/2018  . Left bundle branch block 06/22/2018  . History of iron deficiency anemia 06/22/2018  . Acute on chronic heart failure ([facility]) 06/22/2018  . Chronic bilateral low back pain without sciatica 12/12/2015  . 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  . Iron deficiency anemia 03/28/2015  . Hyperlipidemia 10/12/2014    Current Outpatient Medications:  .  aspirin 81 MG EC tablet, Take 1 tablet (81 mg total) by mouth daily. Swallow whole., Disp: 30 tablet, Rfl: 0 .  atorvastatin (LIPITOR) 40 MG tablet, TAKE ONE TABLET BY MOUTH EVERYDAY TABLET 6PM FOR CHOLESTEROL, Disp: 30 tablet, Rfl: 0 .  carvedilol (COREG) 3.125 MG tablet, Take 1 tablet (3.125 mg total) by mouth 2 (two) times  daily., Disp: 60 tablet, Rfl: 0 .  digoxin (LANOXIN) 0.125 MG tablet, TAKE ONE TABLET BY MOUTH DAILY, Disp: 30 tablet, Rfl: 5 .  FARXIGA 10 MG TABS tablet, TAKE ONE TABLET BY MOUTH DAILY IN THE MORNING BEFORE BREAKFAST, Disp: 30 tablet, Rfl: 0 .  ferrous sulfate 325 (65 FE) MG tablet, TAKE ONE TABLET BY MOUTH TWO TIMES A DAY WITH MEALS, Disp: 180 tablet, Rfl: 7 .  furosemide (LASIX) 40 MG tablet, Take 1 tablet (40 mg total) by mouth daily., Disp: 90 tablet, Rfl: 3 .  potassium chloride SA (KLOR-CON M) 20 MEQ tablet, Take 1 tablet (20 mEq total) by mouth daily., Disp: 30 tablet, Rfl: 3 .  sacubitril-valsartan (ENTRESTO) 24-26 MG, Take 1 tablet by mouth 2 (two) times daily., Disp: 60 tablet, Rfl: 11 .  spironolactone (ALDACTONE) 25 MG tablet, Take 1 tablet (25 mg total) by mouth at bedtime., Disp: 30 tablet, Rfl: 0 .  sertraline (ZOLOFT) 25 MG tablet, Take 1 tablet (25 mg total) by mouth daily., Disp: 30 tablet, Rfl: 5 No Known Allergies    Social History   Socioeconomic History  . Marital status: Single    Spouse name: Not on file  . Number of children: Not on file  . Years of education: Not on file  . Highest education level: Not on file  Occupational History  . Not on file  Tobacco Use  . Smoking status: Former    Packs/day: 0.50    Types: Cigarettes  . Smokeless tobacco: Never  Vaping Use  . Vaping  Use: Never used  Substance and Sexual Activity  . Alcohol use: Yes    Comment: daily  . Drug use: Yes    Types: Marijuana  . Sexual activity: Not on file  Other Topics Concern  . Not on file  Social History Narrative  . Not on file   Social Determinants of Health   Financial Resource Strain: Not on file  Food Insecurity: Food Insecurity Present (10/17/2021)   Hunger Vital Sign   . Worried About Programme researcher, broadcasting/film/video in the Last Year: Often true   . Ran Out of Food in the Last Year: Sometimes true  Transportation Needs: Unmet Transportation Needs (04/26/2022)   PRAPARE -  Transportation   . Lack of Transportation (Medical): Yes   . Lack of Transportation (Non-Medical): Yes  Physical Activity: Not on file  Stress: Not on file  Social Connections: Not on file  Intimate Partner Violence: Not on file    Physical Exam      Future Appointments  Date Time Provider Department Center  05/23/2022  9:30 AM [facility] PA/NP [facility] None       [name], EMT-P-Paramedic [phone] [facility] Paramedic  05/08/22

## 2022-05-09 ENCOUNTER — Other Ambulatory Visit (HOSPITAL_COMMUNITY): Payer: Self-pay | Admitting: *Deleted

## 2022-05-09 ENCOUNTER — Other Ambulatory Visit (HOSPITAL_COMMUNITY): Payer: Self-pay

## 2022-05-09 MED ORDER — CARVEDILOL 3.125 MG PO TABS: 3.1250 mg | ORAL_TABLET | Freq: Two times a day (BID) | ORAL | 6 refills | Status: DC

## 2022-05-09 MED ORDER — SPIRONOLACTONE 25 MG PO TABS: 25.0000 mg | ORAL_TABLET | Freq: Every day | ORAL | 6 refills | Status: DC

## 2022-05-09 MED ORDER — FUROSEMIDE 40 MG PO TABS: 40.0000 mg | ORAL_TABLET | Freq: Every day | ORAL | 3 refills | Status: DC

## 2022-05-09 MED ORDER — ASPIRIN 81 MG PO TBEC: 81.0000 mg | DELAYED_RELEASE_TABLET | Freq: Every day | ORAL | 6 refills | Status: DC

## 2022-05-15 ENCOUNTER — Other Ambulatory Visit (HOSPITAL_COMMUNITY): Payer: Self-pay | Admitting: Emergency Medicine

## 2022-05-15 NOTE — Progress Notes (Signed)
Paramedicine Encounter    Patient ID: Jeremy Powers, male    DOB: 1955-09-22, 66 y.o.   MRN: [id number]   BP 130/70 (BP Location: Left Arm, Patient Position: Sitting, Cuff Size: Normal)   Pulse 64   Resp 16   Wt 140 lb (63.5 kg)   SpO2 97%   BMI 21.93 kg/m  Weight yesterday-not taken Last visit weight-142lb  ATF Jeremy Powers A&O x 4, skin W&D w/ good color.  Pt reports to be feeling well today.  He denies chest pain or SOB.  Lung sounds at baseline for him w/ some ronchi in the upper lobes bilat.  No edema noted.  Had to switch Jeremy Powers back to pill box from bubble pack as he was getting off track and compliance was not what it was when he was in a pill box.  Med box reconciled x 2 weeks.  Jeremy Powers has a ICD insertion procedure schedule 1/8 w/ Dr. Ladona Ridgel.  He has seemed somewhat apprehensive about it but I reassured him that this was a common procedure and the benefits could outweigh the risks. Home visit complete.    [name], EMT-Paramedic [phone] 05/15/2022   Patient Care Team: [name] as PCP - General (Internal Medicine) [name] Bevelyn Buckles, MD as PCP - Cardiology (Cardiology)  Patient Active Problem List   Diagnosis Date Noted   NICM (nonischemic cardiomyopathy) (HCC) 03/20/2022   Malnutrition of moderate degree 09/23/2021   Dyslipidemia 09/21/2021   Anemia 09/21/2021   AKI (acute kidney injury) (H[facility]) 09/21/2021   Essential hypertension 09/20/2021   CHF (congestive heart failure) (HCC) 05/08/2021   Acute on chronic systolic CHF (congestive heart failure) (HCC) 05/06/2021   Mitral regurgitation 12/26/2020   COVID-19 virus infection 12/24/2020   CHF exacerbation (HCC) 12/23/2020   Acute on chronic systolic HF (heart failure) (HCC) 06/24/2018   Aortic insufficiency 06/22/2018   Acute on chronic congestive heart failure (HCC) 06/22/2018   Elevated troponin 06/22/2018   CAD (coronary artery disease) 06/22/2018   Left bundle branch block 06/22/2018    History of iron deficiency anemia 06/22/2018   Acute on chronic heart failure (H[facility]) 06/22/2018   Chronic bilateral low back pain without sciatica 12/12/2015   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   Iron deficiency anemia 03/28/2015   Hyperlipidemia 10/12/2014    Current Outpatient Medications:    aspirin EC 81 MG tablet, Take 1 tablet (81 mg total) by mouth daily. Swallow whole., Disp: 30 tablet, Rfl: 6   atorvastatin (LIPITOR) 40 MG tablet, TAKE ONE TABLET BY MOUTH EVERYDAY TABLET 6PM FOR CHOLESTEROL, Disp: 30 tablet, Rfl: 0   carvedilol (COREG) 3.125 MG tablet, Take 1 tablet (3.125 mg total) by mouth 2 (two) times daily., Disp: 60 tablet, Rfl: 6   digoxin (LANOXIN) 0.125 MG tablet, TAKE ONE TABLET BY MOUTH DAILY, Disp: 30 tablet, Rfl: 5   FARXIGA 10 MG TABS tablet, TAKE ONE TABLET BY MOUTH DAILY IN THE MORNING BEFORE BREAKFAST, Disp: 30 tablet, Rfl: 0   ferrous sulfate 325 (65 FE) MG tablet, TAKE ONE TABLET BY MOUTH TWO TIMES A DAY WITH MEALS, Disp: 180 tablet, Rfl: 7   furosemide (LASIX) 40 MG tablet, Take 1 tablet (40 mg total) by mouth daily., Disp: 90 tablet, Rfl: 3   potassium chloride SA (KLOR-CON M) 20 MEQ tablet, Take 1 tablet (20 mEq total) by mouth daily., Disp: 30 tablet, Rfl: 3   sacubitril-valsartan (ENTRESTO) 24-26 MG, Take 1 tablet by mouth 2 (two) times daily.,  Disp: 60 tablet, Rfl: 11   sertraline (ZOLOFT) 25 MG tablet, Take 1 tablet (25 mg total) by mouth daily., Disp: 30 tablet, Rfl: 5   spironolactone (ALDACTONE) 25 MG tablet, Take 1 tablet (25 mg total) by mouth at bedtime., Disp: 30 tablet, Rfl: 6 No Known Allergies    Social History   Socioeconomic History   Marital status: Single    Spouse name: Not on file   Number of children: Not on file   Years of education: Not on file   Highest education level: Not on file  Occupational History   Not on file  Tobacco Use   Smoking status: Former    Packs/day: 0.50    Types: Cigarettes   Smokeless  tobacco: Never  Vaping Use   Vaping Use: Never used  Substance and Sexual Activity   Alcohol use: Yes    Comment: daily   Drug use: Yes    Types: Marijuana   Sexual activity: Not on file  Other Topics Concern   Not on file  Social History Narrative   Not on file   Social Determinants of Health   Financial Resource Strain: Not on file  Food Insecurity: Food Insecurity Present (10/17/2021)   Hunger Vital Sign    Worried About Running Out of Food in the Last Year: Often true    Ran Out of Food in the Last Year: Sometimes true  Transportation Needs: Unmet Transportation Needs (04/26/2022)   PRAPARE - Administrator, Civil Service (Medical): Yes    Lack of Transportation (Non-Medical): Yes  Physical Activity: Not on file  Stress: Not on file  Social Connections: Not on file  Intimate Partner Violence: Not on file    Physical Exam      Future Appointments  Date Time Provider Department Center  05/23/2022  9:30 AM [facility] PA/NP [facility] None       [name], Paramedic-Paramedic [phone] [facility] Paramedic  05/15/22

## 2022-05-22 ENCOUNTER — Other Ambulatory Visit (HOSPITAL_COMMUNITY): Payer: Self-pay | Admitting: Family Medicine

## 2022-05-22 ENCOUNTER — Other Ambulatory Visit: Payer: Self-pay | Admitting: Cardiology

## 2022-05-22 ENCOUNTER — Other Ambulatory Visit (HOSPITAL_COMMUNITY): Payer: Self-pay | Admitting: Internal Medicine

## 2022-05-22 NOTE — Progress Notes (Incomplete)
Advanced Heart Failure Clinic Note  Date:  05/22/2022   PCP:  Mackie Pai, PA-C  Cardiologist:  Glori Bickers, MD Primary HF: Dr. Haroldine Laws  Chief Complaint: HF Follow up   History of Present Illness: Jeremy Powers is a 67 y.o.male CAD s/p PCI to LAD in 2015 and 2016, s/p CABG 3500, systolic HF EF 93-81%, microcytic/iron deficiency anemia followed by hematology and GI, LBBB, and tobacco use. Illiterate and cannot read.   In 2/20 he presented to [facility] ED with SOB, cough, and orthopnea in setting of med non-compliance. Was unable to afford copays with Medicaid. He was tachycardic and tachypneic on arrival, and was transferred to [facility] 06/22/18 for further evaluation. Echo showed newly decreased EF of 25-30% from 50-55% in 12/2017.  Underwent R/LHC showing severe 1 v disease with occluded LAD within previous stent. RCA and LCX normal. LIMA to LAD and SVG to D2 patent. Cath also showed low filling pressures with moderately low cardiac output. Meds adjusted as tolerated.   OV 04/05/20 returns for HF follow up after not being seen in > 1 year. Followed by HF Paramedicine. Lasix was stopped & Wilder Glade was started.  Echo 2/22 LVEF 20-25%, RV ok.    3/22 he was discharged from community paramedicine program and bubble pill packs arranged.   Seen in the ED 10/25/20 for CHF exacerbation in the setting of medication non-compliance. He was diuresed with IV lasix and discharged home. Sent to the ED 12/23/20 by his PCP and found to be A/C CHF and COVID +. Repeat echo EF 10-15%. Diuresed 13 lbs, beta blocker stopped with acute decompensation, digoxin added, and Entresto held with low BP.  Admitted 12/22 with A/C CHF after stopping his medications a month prior. Echo showed EF 20-25%. He was diuresed with IV lasix and GDMT restarted.   Out of HF meds at follow up, volume up. Meds restarted and paramedicine arranged.  Admitted 5/23 with a/c CHF, due to non-compliance with medications. Diuresed with IV  lasix. Hospitalization complicated by AKI. GDMT restarted after SCr improved. Discharged home, weight 138 lbs.  Today he returns for HF follow up with Paramedic DeDee Says he has been doing really wel recently.   Cardiac Studies: - Echo (12/22): EF 20-25%, global HK, grade II DD, RV moderately reduced, moderate MR  - Echo (8/22): EF 10-15%, Repeat echo showed EF 10-15%, severe global hypokinesis with septal akinesis, LV severely dilated, grade II DD, RV fxn moderate to severely reduced, moderate to severe MR, moderate to severe AI.   - Echo (2/22): EF 20-25%, RV ok   - Echo (9/20): EF 20-25%   - Echo (2/20): EF 20-25%   - cMRI (06/27/18): EF 24%. Normal RV   - R/LHC (2/20) RA = 1 RV = 19/3 PA = 24/5 (14) PCW = 7 Fick cardiac output/index = 4.0/2.5 PVR = 1.9 WU Ao sat = 95% PA sat = 68%, 71%   Assessment: 1. Severe 1-vessel CAD with occluded LAD within previous stent 2. Normal RCA and LCX 3. LIMA to LAD widely patent 4. SVG - D2 widely patent 5. SVG - D1 likely occluded (grafts not marked)  ROS: All systems negative except as listed in HPI, PMH and Problem List.  Past Medical History:  Diagnosis Date   CHF (congestive heart failure) (HCC)    Cirrhosis ([place])    Coronary artery disease    Hypertension    Pancreatitis    Past Surgical History:  Procedure Laterality Date  CARDIAC SURGERY     RIGHT/LEFT HEART CATH AND CORONARY/GRAFT ANGIOGRAPHY N/A 06/24/2018   Procedure: RIGHT/LEFT HEART CATH AND CORONARY/GRAFT ANGIOGRAPHY;  Surgeon: Jolaine Artist, MD;  Location: [place] [facility];  Service: Cardiovascular;  Laterality: N/A;   Current Outpatient Medications  Medication Sig Dispense Refill   aspirin EC 81 MG tablet Take 1 tablet (81 mg total) by mouth daily. Swallow whole. 30 tablet 6   atorvastatin (LIPITOR) 40 MG tablet TAKE ONE TABLET BY MOUTH EVERYDAY TABLET 6PM FOR CHOLESTEROL 30 tablet 0   carvedilol (COREG) 3.125 MG tablet Take 1 tablet (3.125 mg total) by  mouth 2 (two) times daily. 60 tablet 6   digoxin (LANOXIN) 0.125 MG tablet TAKE ONE TABLET BY MOUTH DAILY 30 tablet 5   FARXIGA 10 MG TABS tablet TAKE ONE TABLET BY MOUTH DAILY IN THE MORNING BEFORE BREAKFAST 30 tablet 0   ferrous sulfate 325 (65 FE) MG tablet TAKE ONE TABLET BY MOUTH TWO TIMES A DAY WITH MEALS 180 tablet 7   furosemide (LASIX) 40 MG tablet Take 1 tablet (40 mg total) by mouth daily. 90 tablet 3   potassium chloride SA (KLOR-CON M) 20 MEQ tablet Take 1 tablet (20 mEq total) by mouth daily. 30 tablet 3   sacubitril-valsartan (ENTRESTO) 24-26 MG Take 1 tablet by mouth 2 (two) times daily. 60 tablet 11   sertraline (ZOLOFT) 25 MG tablet Take 1 tablet (25 mg total) by mouth daily. 30 tablet 5   spironolactone (ALDACTONE) 25 MG tablet Take 1 tablet (25 mg total) by mouth at bedtime. 30 tablet 6   No current facility-administered medications for this visit.   Allergies:   Patient has no known allergies.   Social History:  The patient  reports that he has quit smoking. His smoking use included cigarettes. He smoked an average of .5 packs per day. He has never used smokeless tobacco. He reports current alcohol use. He reports current drug use. Drug: Marijuana.   Family History:  The patient's family history is not on file.   ROS:  Please see the history of present illness.   All other systems are personally reviewed and negative.   Recent Labs: 06/02/2021: ALT 11; Pro B Natriuretic peptide (BNP) 2,394.0 09/24/2021: Magnesium 1.5 02/20/2022: B Natriuretic Peptide 1,979.3 04/16/2022: BUN 11; Creatinine, Ser 0.92; Hemoglobin 13.2; Platelets 219; Potassium 4.5; Sodium 141  Personally reviewed   There were no vitals taken for this visit.  Wt Readings from Last 3 Encounters:  05/15/22 63.5 kg (140 lb)  05/08/22 64.4 kg (142 lb)  05/01/22 62.2 kg (137 lb 3.2 oz)   Physical Exam: General:  Well appearing. No resp difficulty HEENT: normal Poor dentition Neck: supple. no JVD. Carotids  2+ bilat; no bruits. No lymphadenopathy or thryomegaly appreciated. Cor: PMI nondisplaced. Regular rate & rhythm. No rubs, gallops or murmurs. Lungs: clear Abdomen: soft, nontender, nondistended. No hepatosplenomegaly. No bruits or masses. Good bowel sounds. Extremities: no cyanosis, clubbing, rash, edema possible lipoma on left knee Neuro: alert & orientedx3, cranial nerves grossly intact. moves all 4 extremities w/o difficulty. Affect pleasant  ASSESSMENT AND PLAN: 1.  Chronic biventricular heart failure, NICM - Echo (2/20): EF 25-30%,   - cMRI (2/20) with EF 24% - Echo (9/20): EF 20-25% - Echo (2/22): EF 20-25% RV ok - Echo (8/22): EF 10-15%, moderately to severely reduced RV function, moderate to severe MR, moderate to severe AI. - Echo (12/22): EF 20-25%, global HK, grade II DD, RV moderately reduced, moderate  MR - Previously referred to EP for consideration of CRT-D with wide LBBB but didn't follow up. - Much improved with Paramedicine help. NYHA II - Continue Entresto to 264/26 mg bid. BP too low to titrate - Continue Lasix 40 mg daily and KCL 20 daily. - Continue digoxin 0.125 mg daily. Dig level 0.2 on 10/17/21. - Continue carvedilol 3.125 mg bid. - Continue Farxiga 10 mg daily. - Continue spiro 25 mg daily. - He is not a candidate for advanced therapies. - Doing well with paramedicine help. Repeat echo. Refer for CRT-P/D   2. CAD - S/P CABG 2017, LHC 06/2018  with occluded LAD normal LCX and RCA. LIMA to LAD and SVG to D2 patent (SVG to D1 occluded) - No s/s angina - Continue aspirin 81 mg daily + atorvastatin.      3. LBBB - QRS 192 ms - Previously referred to EP for consideration of CRT-D with wide LBBB but didn't follow up.   4. Valvular heart disease - Moderate MR and mild AI on echo 12/22. LVIDd 7.1 cm - Will follow. - repeat echo    5. h/o tobacco use - No longer smoking.  - Lives with people who do smoke.  6. ETOH use - Drinks 1 beer/day. (6-pack per  week) - Discussed cutting back/stopping.   7. HTN - Stable. - GDMT as above   8. Anemia - Iron deficient. On oral Fe. - Followed by PCP.  9. Illiterate/SDOH - Cannot read. Able to write numbers. Needs assistance with medications.    - Continue w/ paramedicine. I will notify [name] of med changes today.  10. Left knee mass - likely lipoma but in odd place - will get u/s, (D/w Radiology)  Signed, Jacklynn Ganong, FNP  05/22/2022 1:13 PM  [facility] [address] [facility] [place] [address] [phone] (office) [phone] (fax)

## 2022-05-23 ENCOUNTER — Encounter (HOSPITAL_COMMUNITY): Payer: Medicare Other

## 2022-05-24 ENCOUNTER — Telehealth: Payer: Self-pay

## 2022-05-24 ENCOUNTER — Encounter (HOSPITAL_COMMUNITY): Payer: Self-pay | Admitting: Emergency Medicine

## 2022-05-24 ENCOUNTER — Other Ambulatory Visit: Payer: Self-pay

## 2022-05-24 ENCOUNTER — Other Ambulatory Visit (HOSPITAL_COMMUNITY): Payer: Self-pay | Admitting: Emergency Medicine

## 2022-05-24 DIAGNOSIS — I428 Other cardiomyopathies: Secondary | ICD-10-CM

## 2022-05-24 NOTE — Progress Notes (Signed)
Paramedicine Encounter    Patient ID: Jeremy Powers, male    DOB: 03-12-1956, 67 y.o.   MRN: [id number]   BP 110/68 (BP Location: Left Arm, Patient Position: Sitting, Cuff Size: Normal)   Pulse 62   Resp 16   Wt 136 lb (61.7 kg)   SpO2 96%   PF 96 L/min   BMI 21.30 kg/m  Weight yesterday-not taken Last visit weight-140lb  ATF Jeremy Powers at home drinking a 40oz beer.  He was A&O x 4, skin W&D w/ good color.  He did not appear to be altered from the alcohol.  Pt denies chest pain or SOB.  No edema noted.  He has done very well with his meds from his pill box.  He has another weeks worth of meds reconciled and asked if we could do a 2 week reconciliation next week.   He is scheduled for his ICD insertion w/ Dr. Lovena Le on Monday.  I reviewed with him that he has a ride with [facility] that will pick him up 8:45 on Monday.  I also reviewed with him the instructions of nothing to eat after midnight, no meds the morning of the surgery and bathing with the special wash the night before and morning of the procedure.  He verbalized he understood same.  I also  reiterated to the family that this will not be an overnight visit and someone will have to come and pick him up after the procedure is finished and they also verbalized they understood same.  Home visit complete.    [name], [place] 05/24/2022   Patient Care Team: Elise Benne as PCP - General (Internal Medicine) Haroldine Laws Shaune Pascal, MD as PCP - Cardiology (Cardiology)  Patient Active Problem List   Diagnosis Date Noted   NICM (nonischemic cardiomyopathy) ([place]) 03/20/2022   Malnutrition of moderate degree 09/23/2021   Dyslipidemia 09/21/2021   Anemia 09/21/2021   AKI (acute kidney injury) ([place]) 09/21/2021   Essential hypertension 09/20/2021   CHF (congestive heart failure) ([place]) 05/08/2021   Acute on chronic systolic CHF (congestive heart failure) ([place]) 05/06/2021   Mitral regurgitation 12/26/2020    COVID-19 virus infection 12/24/2020   CHF exacerbation ([place]) 12/23/2020   Acute on chronic systolic HF (heart failure) ([place]) 06/24/2018   Aortic insufficiency 06/22/2018   Acute on chronic congestive heart failure ([place]) 06/22/2018   Elevated troponin 06/22/2018   CAD (coronary artery disease) 06/22/2018   Left bundle branch block 06/22/2018   History of iron deficiency anemia 06/22/2018   Acute on chronic heart failure ([place]) 06/22/2018   Chronic bilateral low back pain without sciatica 12/12/2015   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   Iron deficiency anemia 03/28/2015   Hyperlipidemia 10/12/2014    Current Outpatient Medications:    aspirin EC 81 MG tablet, Take 1 tablet (81 mg total) by mouth daily. Swallow whole., Disp: 30 tablet, Rfl: 6   atorvastatin (LIPITOR) 40 MG tablet, TAKE ONE TABLET BY MOUTH EVERYDAY TABLET 6PM FOR CHOLESTEROL, Disp: 30 tablet, Rfl: 0   carvedilol (COREG) 3.125 MG tablet, Take 1 tablet (3.125 mg total) by mouth 2 (two) times daily., Disp: 60 tablet, Rfl: 6   digoxin (LANOXIN) 0.125 MG tablet, TAKE ONE TABLET BY MOUTH DAILY, Disp: 30 tablet, Rfl: 5   FARXIGA 10 MG TABS tablet, TAKE ONE TABLET BY MOUTH DAILY IN THE MORNING BEFORE BREAKFAST, Disp: 30 tablet, Rfl: 0   ferrous sulfate 325 (65 FE) MG tablet, TAKE ONE  TABLET BY MOUTH TWO TIMES A DAY WITH MEALS, Disp: 180 tablet, Rfl: 7   furosemide (LASIX) 40 MG tablet, Take 1 tablet (40 mg total) by mouth daily., Disp: 90 tablet, Rfl: 3   potassium chloride SA (KLOR-CON M) 20 MEQ tablet, TAKE ONE TABLET BY MOUTH DAILY, Disp: 30 tablet, Rfl: 2   sacubitril-valsartan (ENTRESTO) 24-26 MG, Take 1 tablet by mouth 2 (two) times daily., Disp: 60 tablet, Rfl: 11   sertraline (ZOLOFT) 25 MG tablet, Take 1 tablet (25 mg total) by mouth daily., Disp: 30 tablet, Rfl: 5   spironolactone (ALDACTONE) 25 MG tablet, Take 1 tablet (25 mg total) by mouth at bedtime., Disp: 30 tablet, Rfl: 6 No Known  Allergies    Social History   Socioeconomic History   Marital status: Single    Spouse name: Not on file   Number of children: Not on file   Years of education: Not on file   Highest education level: Not on file  Occupational History   Not on file  Tobacco Use   Smoking status: Former    Packs/day: 0.50    Types: Cigarettes   Smokeless tobacco: Never  Vaping Use   Vaping Use: Never used  Substance and Sexual Activity   Alcohol use: Yes    Comment: daily   Drug use: Yes    Types: Marijuana   Sexual activity: Not on file  Other Topics Concern   Not on file  Social History Narrative   Not on file   Social Determinants of Health   Financial Resource Strain: Not on file  Food Insecurity: Food Insecurity Present (10/17/2021)   Hunger Vital Sign    Worried About Running Out of Food in the Last Year: Often true    Ran Out of Food in the Last Year: Sometimes true  Transportation Needs: Unmet Transportation Needs (04/26/2022)   PRAPARE - Hydrologist (Medical): Yes    Lack of Transportation (Non-Medical): Yes  Physical Activity: Not on file  Stress: Not on file  Social Connections: Not on file  Intimate Partner Violence: Not on file    Physical Exam      Future Appointments  Date Time Provider [place]  06/07/2022 10:05 AM Baldwin Jamaica, PA-C [facility]  09/04/2022  3:00 PM Evans Lance, MD [facility]       [name], [place] Paramedic  05/24/22

## 2022-05-24 NOTE — Telephone Encounter (Signed)
Paramedic called in and stated that pt went to [facility] today to have his labwork done for his upcoming procedure on Monday 05/28/22. He was told they didn't have the lab orders.   Orders are in our system and have been released but they still couldn't see them.   I have faxed over the order to them @ [phone] and received confirmation that it went through.   I will call Paramedic Tamala Julian and make her aware that they have received the orders.

## 2022-05-24 NOTE — Progress Notes (Signed)
At the end of today's home visit, Mr Caporale stepdaughter Philis Nettle took him to [facility] in [place] for lab work. I received a call from [place] that when he arrived there were no orders.  I had them wait and I called and spoke with April Garrison, CMA at Dr. Tanna Furry office and she said she would contact [facility] and send them over.   When I reached back out to family and advised them the orders were on there way.   Somehow, in the end, [facility] still did not receive the orders and Mr. Sherlin left and went back home.  I again reached back out and spoke to April who advised she ended up faxing them the orders because there were not going through electronically for some reason.  Ms. Louretta Shorten called me back to let me know everything had been faxed and she received confirmation that it had been received on [facility]'s end. I re-contacted Enid Derry and let her know that Philis Nettle needed to take him back to [facility] in the morning so that he can be prepared to have his procedure on Monday and she advised she would do same.    [name], [place] 05/24/2022

## 2022-05-25 ENCOUNTER — Telehealth (HOSPITAL_COMMUNITY): Payer: Self-pay | Admitting: Emergency Medicine

## 2022-05-25 NOTE — Telephone Encounter (Signed)
Called to remind Enid Derry of Tyde's need for lab work @ [facility] today.  No answer.  Unable to leave voicemail.    [name], [place] 05/25/2022

## 2022-05-25 NOTE — Pre-Procedure Instructions (Signed)
Attempted to call patient regarding procedure instructions for Monday.  Left voicemail on the following. Instructed patient on the following items: Arrival time 0930 Nothing to eat or drink after midnight No meds AM of procedure Responsible person to drive you home and stay with you for 24 hrs Wash with special soap night before and morning of procedure

## 2022-05-25 NOTE — Telephone Encounter (Signed)
Called to remind of need for [facility] visit today.  Unable to leave voicemail.    [name], [place] 05/25/2022

## 2022-05-25 NOTE — Telephone Encounter (Signed)
Called and spoke with Jeremy Powers to remind him of his procedure scheduled for Monday.  He is to be ready for the taxi @ 8:45.  He verbalizes he understands same.  I will call and check in with him on Tuesday.    [name], [place] 05/25/2022

## 2022-05-26 LAB — CBC
Hematocrit: 38 % (ref 37.5–51.0)
Hemoglobin: 12.8 g/dL — ABNORMAL LOW (ref 13.0–17.7)
MCH: 31.8 pg (ref 26.6–33.0)
MCHC: 33.7 g/dL (ref 31.5–35.7)
MCV: 94 fL (ref 79–97)
Platelets: 226 10*3/uL (ref 150–450)
RBC: 4.03 x10E6/uL — ABNORMAL LOW (ref 4.14–5.80)
RDW: 12.7 % (ref 11.6–15.4)
WBC: 4.5 10*3/uL (ref 3.4–10.8)

## 2022-05-26 LAB — BASIC METABOLIC PANEL
BUN/Creatinine Ratio: 15 (ref 10–24)
BUN: 13 mg/dL (ref 8–27)
CO2: 21 mmol/L (ref 20–29)
Calcium: 8.8 mg/dL (ref 8.6–10.2)
Chloride: 105 mmol/L (ref 96–106)
Creatinine, Ser: 0.86 mg/dL (ref 0.76–1.27)
Glucose: 68 mg/dL — ABNORMAL LOW (ref 70–99)
Potassium: 4.6 mmol/L (ref 3.5–5.2)
Sodium: 141 mmol/L (ref 134–144)
eGFR: 94 mL/min/{1.73_m2} (ref >59)

## 2022-05-28 ENCOUNTER — Ambulatory Visit (HOSPITAL_COMMUNITY): Payer: 59

## 2022-05-28 ENCOUNTER — Other Ambulatory Visit: Payer: Self-pay

## 2022-05-28 ENCOUNTER — Ambulatory Visit (HOSPITAL_COMMUNITY)
Admission: RE | Disposition: A | Discharge status: Home / Self Care | Payer: Self-pay | Source: Home / Self Care | Attending: Internal Medicine

## 2022-05-28 ENCOUNTER — Ambulatory Visit (HOSPITAL_COMMUNITY)
Admission: RE | Admit: 2022-05-28 | Discharge: 2022-05-28 | Disposition: A | Discharge status: Home / Self Care | Payer: 59 | Attending: Internal Medicine | Admitting: Internal Medicine

## 2022-05-28 DIAGNOSIS — I255 Ischemic cardiomyopathy: Secondary | ICD-10-CM | POA: Insufficient documentation

## 2022-05-28 DIAGNOSIS — I447 Left bundle-branch block, unspecified: Secondary | ICD-10-CM | POA: Insufficient documentation

## 2022-05-28 DIAGNOSIS — Z87891 Personal history of nicotine dependence: Secondary | ICD-10-CM | POA: Diagnosis not present

## 2022-05-28 DIAGNOSIS — N181 Chronic kidney disease, stage 1: Secondary | ICD-10-CM | POA: Diagnosis not present

## 2022-05-28 DIAGNOSIS — I130 Hypertensive heart and chronic kidney disease with heart failure and stage 1 through stage 4 chronic kidney disease, or unspecified chronic kidney disease: Secondary | ICD-10-CM | POA: Insufficient documentation

## 2022-05-28 DIAGNOSIS — Z951 Presence of aortocoronary bypass graft: Secondary | ICD-10-CM | POA: Diagnosis not present

## 2022-05-28 DIAGNOSIS — I252 Old myocardial infarction: Secondary | ICD-10-CM | POA: Diagnosis not present

## 2022-05-28 DIAGNOSIS — I2510 Atherosclerotic heart disease of native coronary artery without angina pectoris: Secondary | ICD-10-CM | POA: Diagnosis not present

## 2022-05-28 DIAGNOSIS — I5022 Chronic systolic (congestive) heart failure: Secondary | ICD-10-CM | POA: Insufficient documentation

## 2022-05-28 HISTORY — PX: BIV ICD INSERTION CRT-D: EP1195

## 2022-05-28 SURGERY — BIV ICD INSERTION CRT-D

## 2022-05-28 MED ORDER — FENTANYL CITRATE (PF) 100 MCG/2ML IJ SOLN
INTRAMUSCULAR | Status: AC
  Filled 2022-05-28: qty 2

## 2022-05-28 MED ORDER — MIDAZOLAM HCL 5 MG/5ML IJ SOLN
INTRAMUSCULAR | Status: AC
  Filled 2022-05-28: qty 5

## 2022-05-28 MED ORDER — FENTANYL CITRATE (PF) 100 MCG/2ML IJ SOLN
INTRAMUSCULAR | Status: DC | PRN
  Administered 2022-05-28 (×3): 12.5 ug via INTRAVENOUS

## 2022-05-28 MED ORDER — SODIUM CHLORIDE 0.9 % IV SOLN
INTRAVENOUS | Status: AC
  Filled 2022-05-28: qty 2

## 2022-05-28 MED ORDER — ACETAMINOPHEN 325 MG PO TABS: 325.0000 mg | ORAL_TABLET | ORAL | Status: DC | PRN

## 2022-05-28 MED ORDER — SODIUM CHLORIDE 0.9 % IV SOLN: INTRAVENOUS | Status: DC

## 2022-05-28 MED ORDER — ONDANSETRON HCL 4 MG/2ML IJ SOLN: 4.0000 mg | Freq: Four times a day (QID) | INTRAMUSCULAR | Status: DC | PRN

## 2022-05-28 MED ORDER — CEFAZOLIN SODIUM-DEXTROSE 1-4 GM/50ML-% IV SOLN
1.0000 g | Freq: Once | INTRAVENOUS | Status: AC
  Administered 2022-05-28: 1 g via INTRAVENOUS
  Filled 2022-05-28: qty 50

## 2022-05-28 MED ORDER — POVIDONE-IODINE 10 % EX SWAB
2.0000 | Freq: Once | CUTANEOUS | Status: AC
  Administered 2022-05-28: 2 via TOPICAL

## 2022-05-28 MED ORDER — CEFAZOLIN SODIUM-DEXTROSE 2-4 GM/100ML-% IV SOLN
2.0000 g | INTRAVENOUS | Status: AC
  Administered 2022-05-28: 2 g via INTRAVENOUS

## 2022-05-28 MED ORDER — HEPARIN (PORCINE) IN NACL 1000-0.9 UT/500ML-% IV SOLN
INTRAVENOUS | Status: AC
  Filled 2022-05-28: qty 500

## 2022-05-28 MED ORDER — IOHEXOL 350 MG/ML SOLN
INTRAVENOUS | Status: DC | PRN
  Administered 2022-05-28: 5 mL

## 2022-05-28 MED ORDER — MIDAZOLAM HCL 5 MG/5ML IJ SOLN
INTRAMUSCULAR | Status: DC | PRN
  Administered 2022-05-28 (×3): 1 mg via INTRAVENOUS

## 2022-05-28 MED ORDER — CHLORHEXIDINE GLUCONATE 4 % EX LIQD
4.0000 | Freq: Once | CUTANEOUS | Status: DC
  Filled 2022-05-28: qty 60

## 2022-05-28 MED ORDER — CEFAZOLIN SODIUM-DEXTROSE 2-4 GM/100ML-% IV SOLN
INTRAVENOUS | Status: AC
  Filled 2022-05-28: qty 100

## 2022-05-28 MED ORDER — SODIUM CHLORIDE 0.9 % IV SOLN
80.0000 mg | INTRAVENOUS | Status: AC
  Administered 2022-05-28: 80 mg

## 2022-05-28 MED ORDER — LIDOCAINE HCL (PF) 1 % IJ SOLN
INTRAMUSCULAR | Status: DC | PRN
  Administered 2022-05-28: 60 mL

## 2022-05-28 SURGICAL SUPPLY — 13 items
CABLE SURGICAL S-101-97-12 (CABLE) ×1 IMPLANT
CATH ACUITYPRO 45CM EH 9F (CATHETERS) IMPLANT
CATH JOSEPH QUAD ALLRED 6F REP (CATHETERS) IMPLANT
ICD MOMENTUM X4 CRT-D G138 (ICD Generator) IMPLANT
LEAD ACUITY X4 4671 (Lead) IMPLANT
LEAD INGEVITY 7841 52 (Lead) IMPLANT
LEAD RELIANCE 0137-59 (Lead) IMPLANT
PAD DEFIB RADIO PHYSIO CONN (PAD) ×1 IMPLANT
SHEATH 7FR PRELUDE SNAP 13 (SHEATH) IMPLANT
SHEATH 9.5FR PRELUDE SNAP 13 (SHEATH) IMPLANT
SHEATH 9FR PRELUDE SNAP 13 (SHEATH) IMPLANT
TRAY PACEMAKER INSERTION (PACKS) ×1 IMPLANT
WIRE ACUITY WHISPER EDS 4648 (WIRE) IMPLANT

## 2022-05-28 NOTE — Discharge Instructions (Addendum)
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 After 7 days, you may progress as below.  You should remove your sling 24 hours after your procedure, unless otherwise instructed by your provider.     Monday June 04, 2022  Tuesday June 05, 2022 Wednesday June 06, 2022 Thursday June 07, 2022   Do not lift, push, pull, or carry anything over 10 pounds with the affected arm until 6 weeks (Monday July 09, 2022 ) after your procedure.   You may drive AFTER your wound check, unless you have been told otherwise by your provider.   Ask your healthcare provider when you can go back to work   INCISION/Dressing If you are on a blood thinner such as Coumadin, Xarelto, Eliquis, Plavix, or Pradaxa please confirm with your provider when this should be resumed.   If large square, outer bandage is left in place, this can be removed after 24 hours from your procedure. Do not remove steri-strips or glue as below.   Monitor your defibrillator site for redness, swelling, and drainage. Call the device clinic at [phone] if you experience these symptoms or fever/chills.  If your incision is sealed with Steri-strips or staples, you may shower 7 days after your procedure or when told by your provider. Do not remove the steri-strips or let the shower hit directly on your site. You may wash around your site with soap and water.    If you were discharged in a sling, please do not wear this during the day more than 48 hours after your surgery unless otherwise instructed. This may increase the risk of stiffness and soreness in your shoulder.   Avoid lotions, ointments, or perfumes over your incision until it is well-healed.  You may use a hot tub or a pool AFTER your wound check appointment if the incision is completely closed.  Your ICD is designed to protect you from  life threatening heart rhythms. Because of this, you may receive a shock.   1 shock with no symptoms:  Call the office during business hours. 1 shock with symptoms (chest pain, chest pressure, dizziness, lightheadedness, shortness of breath, overall feeling unwell):  Call 911. If you experience 2 or more shocks in 24 hours:  Call 911. If you receive a shock, you should not drive for 6 months per the [place] DMV IF you receive appropriate therapy from your ICD.   ICD Alerts:  Some alerts are vibratory and others beep. These are NOT emergencies. Please call our office to let us know. If this occurs at night or on weekends, it can wait until the next business day. Send a remote transmission.  If your device is capable of reading fluid status (for heart failure), you will be offered monthly monitoring to review this with you.   DEVICE MANAGEMENT Remote monitoring is used to monitor your ICD from home. This monitoring is scheduled every 91 days by our office. It allows [place] to keep an eye on the functioning of your device to ensure it is working properly. You will routinely see your Electrophysiologist annually (more often if necessary).   You should receive your ID card for your new device in 4-8 weeks. Keep this card with you at all times once received. Consider wearing a medical alert bracelet or necklace.  Your ICD  may be MRI compatible. This will be discussed at your  next office visit/wound check.  You should avoid contact with strong electric or magnetic fields.   Do not use amateur (ham) radio equipment or electric (arc) welding torches. MP3 player headphones with magnets should not be used. Some devices are safe to use if held at least 12 inches (30 cm) from your defibrillator. These include power tools, lawn mowers, and speakers. If you are unsure if something is safe to use, ask your health care provider.  When using your cell phone, hold it to the ear that is on the opposite side from the  defibrillator. Do not leave your cell phone in a pocket over the defibrillator.  You may safely use electric blankets, heating pads, computers, and microwave ovens.  Call the office right away if: You have chest pain. You feel more than one shock. You feel more short of breath than you have felt before. You feel more light-headed than you have felt before. Your incision starts to open up.  This information is not intended to replace advice given to you by your health care provider. Make sure you discuss any questions you have with your health care provider.

## 2022-05-28 NOTE — Progress Notes (Signed)
Per Dr. Lovena Le pt is clear to leave prior to 1900

## 2022-05-28 NOTE — H&P (Signed)
HPI [name] is referred by Dr. Reine Powers for consideration for Biv ICD Insertion. He is a pleasant 67 yo man with a h/o noncompliance, EF 25%, single vessel CAD with an occluded LAD. He has LBBB and a prior CABG. He has recently become more compliant. He has class 2 symptoms. No h/o syncope. He has been on GDMT.  No Known Allergies           Current Outpatient Medications  Medication Sig Dispense Refill   aspirin 81 MG EC tablet Take 1 tablet (81 mg total) by mouth daily. Swallow whole. 30 tablet 0   atorvastatin (LIPITOR) 40 MG tablet TAKE ONE TABLET BY MOUTH EVERYDAY TABLET 6PM FOR CHOLESTEROL 30 tablet 1   carvedilol (COREG) 3.125 MG tablet Take 1 tablet (3.125 mg total) by mouth 2 (two) times daily. 60 tablet 0   digoxin (LANOXIN) 0.125 MG tablet TAKE ONE TABLET BY MOUTH DAILY 30 tablet 5   FARXIGA 10 MG TABS tablet TAKE ONE TABLET BY MOUTH DAILY IN THE MORNING BEFORE BREAKFAST 30 tablet 1   ferrous sulfate 325 (65 FE) MG tablet TAKE ONE TABLET BY MOUTH TWO TIMES A DAY WITH MEALS 180 tablet 7   furosemide (LASIX) 40 MG tablet Take 1 tablet (40 mg total) by mouth daily. 90 tablet 3   potassium chloride SA (KLOR-CON M) 20 MEQ tablet Take 1 tablet (20 mEq total) by mouth daily. 30 tablet 3   sacubitril-valsartan (ENTRESTO) 24-26 MG Take 1 tablet by mouth 2 (two) times daily. 60 tablet 11   sertraline (ZOLOFT) 25 MG tablet Take 1 tablet (25 mg total) by mouth daily. 30 tablet 5   spironolactone (ALDACTONE) 25 MG tablet Take 1 tablet (25 mg total) by mouth at bedtime. 30 tablet 0    No current facility-administered medications for this visit.            Past Medical History:  Diagnosis Date   CHF (congestive heart failure) (HCC)     Cirrhosis (HCC)     Coronary artery disease     Hypertension     Pancreatitis        ROS:    All systems reviewed and negative except as noted in the HPI.          Past Surgical History:  Procedure Laterality Date   CARDIAC SURGERY        RIGHT/LEFT HEART CATH AND CORONARY/GRAFT ANGIOGRAPHY N/A 06/24/2018    Procedure: RIGHT/LEFT HEART CATH AND CORONARY/GRAFT ANGIOGRAPHY;  Surgeon: Jeremy Artist, MD;  Location: [facility];  Service: Cardiovascular;  Laterality: N/A;             Family History  Problem Relation Age of Onset   Iron deficiency Neg Hx          Social History         Socioeconomic History   Marital status: Single      Spouse name: Not on file   Number of children: Not on file   Years of education: Not on file   Highest education level: Not on file  Occupational History   Not on file  Tobacco Use   Smoking status: Former      Packs/day: 0.50      Types: Cigarettes   Smokeless tobacco: Never  Vaping Use   Vaping Use: Never used  Substance and Sexual Activity   Alcohol use: Yes      Comment: daily  Drug use: Yes      Types: Marijuana   Sexual activity: Not on file  Other Topics Concern   Not on file  Social History Narrative   Not on file    Social Determinants of Health        Financial Resource Strain: Not on file  Food Insecurity: Food Insecurity Present (10/17/2021)    Hunger Vital Sign     Worried About Running Out of Food in the Last Year: Often true     Ran Out of Food in the Last Year: Sometimes true  Transportation Needs: No Transportation Needs (11/16/2021)    PRAPARE - Therapist, art (Medical): No     Lack of Transportation (Non-Medical): No  Recent Concern: Transportation Needs - Unmet Transportation Needs (11/16/2021)    PRAPARE - Therapist, art (Medical): Yes     Lack of Transportation (Non-Medical): Yes  Physical Activity: Not on file  Stress: Not on file  Social Connections: Not on file  Intimate Partner Violence: Not on file        BP 132/60   Pulse 77   Ht 5\' 7"  (1.702 m)   Wt 132 lb 9.6 oz (60.1 kg)   SpO2 98%   BMI 20.77 kg/m    Physical Exam:   Well appearing 67 yo man, NAD HEENT:  Unremarkable Neck:  No JVD, no thyromegally Lymphatics:  No adenopathy Back:  No CVA tenderness Lungs:  Clear with no wheezes HEART:  Regular rate rhythm, no murmurs, no rubs, no clicks Abd:  soft, positive bowel sounds, no organomegally, no rebound, no guarding Ext:  2 plus pulses, no edema, no cyanosis, no clubbing Skin:  No rashes no nodules Neuro:  CN II through XII intact, motor grossly intact   EKG - nsr with LBBB   Assess/Plan:  Chronic systolic heart failure EF 25%, with LBBB - I have discussed the indications/risks/benefits/goals/expectations of Biv ICD insertion and he wishes to proceed. CAD - he is s/p CABG and denies anginal symptoms.  HTN - his bp is controlled. Continue current meds.  Chronic renal failure - his renal function has recently improved going from stage 2 to 1. We will avoid excess IV dye.   73 Jeremy Math,MD

## 2022-05-29 ENCOUNTER — Telehealth: Payer: Self-pay

## 2022-05-29 ENCOUNTER — Telehealth (HOSPITAL_COMMUNITY): Payer: Self-pay | Admitting: Emergency Medicine

## 2022-05-29 ENCOUNTER — Encounter (HOSPITAL_COMMUNITY): Payer: Self-pay | Admitting: Internal Medicine

## 2022-05-29 MED FILL — Heparin Sod (Porcine)-NaCl IV Soln 1000 Unit/500ML-0.9%: INTRAVENOUS | Qty: 500 | Status: AC

## 2022-05-29 NOTE — Telephone Encounter (Signed)
LM for patient to call us back to review same day follow up.   Follow-up after same day discharge: Implant date: 05/28/2022 MD: Cristopher Peru, MD Device: [facility] [employer] BiV ICD Location: Left Chest   Wound check visit: 06/07/22 with Tommye Standard, PA-C 90 day MD follow-up: 09/04/22 with Dr. Lovena Le  Remote Transmission received:   Dressing/sling removed:  Confirm [place] restart on:

## 2022-05-29 NOTE — Telephone Encounter (Signed)
-----   Message from Baldwin Jamaica, [place] sent at 05/28/2022  2:40 PM EST ----- [facility]  CRT-D Same day d/c GT

## 2022-05-29 NOTE — Telephone Encounter (Signed)
Called Mr. Saban to follow up with him post ICD insertion from 1/8.  LVM, will call him tomorrow.    [name], [place] 05/29/2022

## 2022-05-30 NOTE — Telephone Encounter (Signed)
Left detailed message requesting call back.  Follow-up after same day discharge: Implant date: 05/28/2022 MD: Cristopher Peru, MD Device: [facility] [employer] BiV ICD Location: Left Chest     Wound check visit: 06/07/22 at 10:05 am with RU 90 day MD follow-up: scheduled   Remote Transmission received:   yes   Dressing/sling removed:

## 2022-05-30 NOTE — Telephone Encounter (Signed)
Call received from paramedic.  Pt questioned if he should restart all of his medications.  Pt has not been taking medications since discharge.  Confirmed with paramedic there are no medications Pt should be holding.  Requested paramedic ensure sling and outer bandage had been removed.  Advised to leave steristrips until follow up.  Keep incision site dry until follow up wound check.

## 2022-05-31 ENCOUNTER — Other Ambulatory Visit (HOSPITAL_COMMUNITY): Payer: Self-pay | Admitting: Emergency Medicine

## 2022-05-31 ENCOUNTER — Telehealth: Payer: Self-pay

## 2022-05-31 NOTE — Progress Notes (Signed)
Paramedicine Encounter    Patient ID: Jeremy Powers, male    DOB: 01/11/1956, 67 y.o.   MRN: [id number]   Complaints no complaints today   Assessment A&O x 4, skin W&D w/ good color.  No edema or JVD  Compliance with meds has taken no meds since 05/28/22 due to procedure.  Pill box filled 2 weeks  Refills needed None  Meds changes since last visit none    Social changes none   BP 130/60 (BP Location: Right Arm, Patient Position: Sitting, Cuff Size: Normal)   Pulse 76   Resp 14   SpO2 95%  Weight yesterday-not take Last visit weight-136lb  Home visit with Jeremy Powers found him to have no complaints of chest pain or SOB.  Lung sounds clear and equal bilat.  He has not taken any of his meds since his procedure on the 8th.  I got his pill box reconciled and reviewed how to use the pill box.  I do this with him at each visit.  He is heavily dependant on me telling him what to do.  I also spoke with nurse from Dr. Tanna Furry office as I saw she had been unable to reach him.  She gave me instructions to remove his top bandage from the surgical site which I did.  The sight looked good, no redness, swelling or oozing of any fluids from the site.  He did say the area was itching.  I instructed him to keep the steri strips dry.  He has a post-op visit on the 18th at 10:05 and transportation needs to be scheduled for same.  ACTION: Home visit completed  Skipper Cliche [phone] 05/31/22  Patient Care Team: Elise Benne as PCP - General (Internal Medicine) Haroldine Laws Shaune Pascal, MD as PCP - Cardiology (Cardiology)  Patient Active Problem List   Diagnosis Date Noted   NICM (nonischemic cardiomyopathy) ([place]) 03/20/2022   Malnutrition of moderate degree 09/23/2021   Dyslipidemia 09/21/2021   Anemia 09/21/2021   AKI (acute kidney injury) ([place]) 09/21/2021   Essential hypertension 09/20/2021   CHF (congestive heart failure) ([place]) 05/08/2021   Acute on chronic systolic  CHF (congestive heart failure) ([place]) 05/06/2021   Mitral regurgitation 12/26/2020   COVID-19 virus infection 12/24/2020   CHF exacerbation ([place]) 12/23/2020   Acute on chronic systolic HF (heart failure) ([place]) 06/24/2018   Aortic insufficiency 06/22/2018   Acute on chronic congestive heart failure ([place]) 06/22/2018   Elevated troponin 06/22/2018   CAD (coronary artery disease) 06/22/2018   Left bundle branch block 06/22/2018   History of iron deficiency anemia 06/22/2018   Acute on chronic heart failure ([place]) 06/22/2018   Chronic bilateral low back pain without sciatica 12/12/2015   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   Iron deficiency anemia 03/28/2015   Hyperlipidemia 10/12/2014    Current Outpatient Medications:    aspirin EC 81 MG tablet, Take 1 tablet (81 mg total) by mouth daily. Swallow whole., Disp: 30 tablet, Rfl: 6   atorvastatin (LIPITOR) 40 MG tablet, TAKE ONE TABLET BY MOUTH EVERYDAY TABLET 6PM FOR CHOLESTEROL (Patient taking differently: Take 40 mg by mouth daily.), Disp: 30 tablet, Rfl: 0   carvedilol (COREG) 3.125 MG tablet, Take 1 tablet (3.125 mg total) by mouth 2 (two) times daily., Disp: 60 tablet, Rfl: 6   digoxin (LANOXIN) 0.125 MG tablet, TAKE ONE TABLET BY MOUTH DAILY (Patient taking differently: Take 0.125 mg by mouth daily.), Disp: 30 tablet, Rfl: 5   FARXIGA 10  MG TABS tablet, TAKE ONE TABLET BY MOUTH DAILY IN THE MORNING BEFORE BREAKFAST (Patient taking differently: Take 10 mg by mouth daily.), Disp: 30 tablet, Rfl: 0   ferrous sulfate 325 (65 FE) MG tablet, TAKE ONE TABLET BY MOUTH TWO TIMES A DAY WITH MEALS (Patient taking differently: Take 325 mg by mouth 2 (two) times daily.), Disp: 180 tablet, Rfl: 7   furosemide (LASIX) 40 MG tablet, Take 1 tablet (40 mg total) by mouth daily., Disp: 90 tablet, Rfl: 3   potassium chloride SA (KLOR-CON M) 20 MEQ tablet, TAKE ONE TABLET BY MOUTH DAILY, Disp: 30 tablet, Rfl: 2   sacubitril-valsartan (ENTRESTO)  24-26 MG, Take 1 tablet by mouth 2 (two) times daily., Disp: 60 tablet, Rfl: 11   spironolactone (ALDACTONE) 25 MG tablet, Take 1 tablet (25 mg total) by mouth at bedtime., Disp: 30 tablet, Rfl: 6 No Known Allergies   Social History   Socioeconomic History   Marital status: Single    Spouse name: Not on file   Number of children: Not on file   Years of education: Not on file   Highest education level: Not on file  Occupational History   Not on file  Tobacco Use   Smoking status: Former    Packs/day: 0.50    Types: Cigarettes   Smokeless tobacco: Never  Vaping Use   Vaping Use: Never used  Substance and Sexual Activity   Alcohol use: Yes    Comment: daily   Drug use: Yes    Types: Marijuana   Sexual activity: Not on file  Other Topics Concern   Not on file  Social History Narrative   Not on file   Social Determinants of Health   Financial Resource Strain: Not on file  Food Insecurity: Food Insecurity Present (10/17/2021)   Hunger Vital Sign    Worried About Running Out of Food in the Last Year: Often true    Ran Out of Food in the Last Year: Sometimes true  Transportation Needs: Unmet Transportation Needs (04/26/2022)   PRAPARE - Hydrologist (Medical): Yes    Lack of Transportation (Non-Medical): Yes  Physical Activity: Not on file  Stress: Not on file  Social Connections: Not on file  Intimate Partner Violence: Not on file    Physical Exam      Future Appointments  Date Time Provider [place]  06/07/2022 10:05 AM Baldwin Jamaica, PA-C [facility]  08/28/2022  7:00 AM [facility] DEVICE REMOTES [facility]  09/04/2022  3:00 PM Evans Lance, MD [facility]  11/27/2022  7:00 AM [facility] DEVICE REMOTES [facility]  02/26/2023  7:00 AM [facility] DEVICE REMOTES [facility]  05/28/2023  7:00 AM [facility] DEVICE REMOTES [facility]  08/27/2023   7:00 AM [facility] DEVICE REMOTES [facility]  11/26/2023  7:00 AM [facility] DEVICE REMOTES [facility]  02/25/2024  7:00 AM [facility] DEVICE REMOTES [facility]  05/26/2024  7:00 AM [facility] DEVICE REMOTES [facility]

## 2022-05-31 NOTE — Telephone Encounter (Signed)
Message received from [facility] [place].  She has communicated post discharge wound care instructions to Pt.  She will assess Pt today.  If assistance needed contacting Pt please reach out to her.

## 2022-06-04 NOTE — Progress Notes (Signed)
Cardiology Office Note Date:  06/07/2022  Patient ID:  Jeremy, Powers 17-Jul-1955, MRN [id number] PCP:  Mackie Pai, PA-C  Cardiologist:  Dr. Haroldine Laws Electrophysiologist: Dr. Lovena Le    Chief Complaint:  wound check  History of Present Illness: Jeremy Powers is a 67 y.o. male with history of CAD (PCI to LAD in 2015 and 2016, s/p CABG 2017), chronic CHF, ICM, LBBB, chronic microcytic/iron def anemia (follows with hematology and GI), smoker  Medication non-compliance has been an issue over the years, paramedicine support arranged and of late improved  He saw the HF team 02/20/22, with paramedicine support doing much better with medication compliance. Not felt to be an advanced therapies a candidate. Re-referred to EP to consider CRT.  He saw Dr. Lovena Le 03/20/22, planned for device implant  Underwent CRT-D implant on 05/28/22  Phone note with reports of some oozing and hematoma, assessed/aided by home visiting EMT.  TODAY He feels very well! No CP, palpitations or cardiac awareness Reports no SOB, DOE. No near syncope or syncope.  His site had not bled any further and feels better, swelling has gone down some. He reports good compliance with arm restrictions as well as his medicines. Though this AM, he did leave the house without taking his AM meds because his cab was there and ready for him   Device information BSCi CRT-D implanted 05/28/22   Past Medical History:  Diagnosis Date   CHF (congestive heart failure) (HCC)    Cirrhosis ([place])    Coronary artery disease    Hypertension    Pancreatitis     Past Surgical History:  Procedure Laterality Date   BIV ICD INSERTION CRT-D N/A 05/28/2022   Procedure: BIV ICD INSERTION CRT-D;  Surgeon: Evans Lance, MD;  Location: [facility];  Service: Cardiovascular;  Laterality: N/A;   CARDIAC SURGERY     RIGHT/LEFT HEART CATH AND CORONARY/GRAFT ANGIOGRAPHY N/A 06/24/2018   Procedure: RIGHT/LEFT HEART CATH AND  CORONARY/GRAFT ANGIOGRAPHY;  Surgeon: Jolaine Artist, MD;  Location: [facility];  Service: Cardiovascular;  Laterality: N/A;    Current Outpatient Medications  Medication Sig Dispense Refill   aspirin EC 81 MG tablet Take 1 tablet (81 mg total) by mouth daily. Swallow whole. 30 tablet 6   atorvastatin (LIPITOR) 40 MG tablet TAKE ONE TABLET BY MOUTH EVERYDAY TABLET 6PM FOR CHOLESTEROL (Patient taking differently: Take 40 mg by mouth daily.) 30 tablet 0   carvedilol (COREG) 3.125 MG tablet Take 1 tablet (3.125 mg total) by mouth 2 (two) times daily. 60 tablet 6   digoxin (LANOXIN) 0.125 MG tablet TAKE ONE TABLET BY MOUTH DAILY (Patient taking differently: Take 0.125 mg by mouth daily.) 30 tablet 5   FARXIGA 10 MG TABS tablet TAKE ONE TABLET BY MOUTH DAILY IN THE MORNING BEFORE BREAKFAST (Patient taking differently: Take 10 mg by mouth daily.) 30 tablet 0   ferrous sulfate 325 (65 FE) MG tablet TAKE ONE TABLET BY MOUTH TWO TIMES A DAY WITH MEALS (Patient taking differently: Take 325 mg by mouth 2 (two) times daily.) 180 tablet 7   furosemide (LASIX) 40 MG tablet Take 1 tablet (40 mg total) by mouth daily. 90 tablet 3   potassium chloride SA (KLOR-CON M) 20 MEQ tablet TAKE ONE TABLET BY MOUTH DAILY 30 tablet 2   sacubitril-valsartan (ENTRESTO) 24-26 MG Take 1 tablet by mouth 2 (two) times daily. 60 tablet 11   spironolactone (ALDACTONE) 25 MG tablet Take 1  tablet (25 mg total) by mouth at bedtime. 30 tablet 6   No current facility-administered medications for this visit.    Allergies:   Patient has no known allergies.   Social History:  The patient  reports that he has quit smoking. His smoking use included cigarettes. He smoked an average of .5 packs per day. He has never used smokeless tobacco. He reports current alcohol use. He reports current drug use. Drug: Marijuana.   Family History:  The patient's family history is not on file.  ROS:  Please see the history of present  illness.    All other systems are reviewed and otherwise negative.   PHYSICAL EXAM:  VS:  BP (!) 150/70   Pulse 80   Ht 5\' 7"  (1.702 m)   Wt 133 lb 6.4 oz (60.5 kg)   SpO2 97%   BMI 20.89 kg/m  BMI: Body mass index is 20.89 kg/m. Well nourished, well developed, in no acute distress HEENT: normocephalic, atraumatic Neck: no JVD, carotid bruits or masses Cardiac:   RRR; no significant murmurs, no rubs, or gallops Lungs:   CTA b/l, no wheezing, rhonchi or rales Abd: soft, nontender MS: no deformity or atrophy Ext:  no edema Skin: warm and dry, no rash Neuro:  No gross deficits appreciated Psych: euthymic mood, full affect  ICD site: + hematoma, appears and pt confirms smaller then the photo.  Is soft, nontender, he denies pain.  No further bleeding, no signs if infection Steri strips are intact and maintained in place for now given recent bleeding   EKG:  not done today  Device interrogation done today and reviewed by myself:  Battery and lead measurements are good No arrhythmias Acute implant lead outputs remain LV pacing 100%   Echo (12/22): EF 20-25%, global HK, grade II DD, RV moderately reduced, moderate MR   - Echo (8/22): EF 10-15%, Repeat echo showed EF 10-15%, severe global hypokinesis with septal akinesis, LV severely dilated, grade II DD, RV fxn moderate to severely reduced, moderate to severe MR, moderate to severe AI.    - Echo (2/22): EF 20-25%, RV ok    - Echo (9/20): EF 20-25%    - Echo (2/20): EF 20-25%    - cMRI (06/27/18): EF 24%. Normal RV   - R/LHC (2/20) RA = 1 RV = 19/3 PA = 24/5 (14) PCW = 7 Fick cardiac output/index = 4.0/2.5 PVR = 1.9 WU Ao sat = 95% PA sat = 68%, 71%    Assessment: 1. Severe 1-vessel CAD with occluded LAD within previous stent 2. Normal RCA and LCX 3. LIMA to LAD widely patent 4. SVG - D2 widely patent 5. SVG - D1 likely occluded (grafts not marked)  Recent Labs: 09/24/2021: Magnesium 1.5 02/20/2022: B Natriuretic  Peptide 1,979.3 05/25/2022: BUN 13; Creatinine, Ser 0.86; Hemoglobin 12.8; Platelets 226; Potassium 4.6; Sodium 141  No results found for requested labs within last 365 days.   Estimated Creatinine Clearance: 70.3 mL/min (by C-G formula based on SCr of 0.86 mg/dL).   Wt Readings from Last 3 Encounters:  06/07/22 133 lb 6.4 oz (60.5 kg)  06/05/22 131 lb (59.4 kg)  05/31/22 129 lb 12.8 oz (58.9 kg)     Other studies reviewed: Additional studies/records reviewed today include: summarized above  ASSESSMENT AND PLAN:  CRT-D No active bleeding Steri strips remain Hematoma is soft, nontender, no erythema, not warm  Dr. 07/30/22 has evaluated site, hold ASA for now.   F/u wound visit Monday/Tuesday  next week, pending stability and resolution of his hematoma, for further ASA instructions  We reviewed shock plan and remaining arm restrictions today  ICM Chronic CHF (systolic) On BB, entresto, dig, spiro, lasix No symptoms or exam findings of volume OL No HF data available yet  CAD No anginal symptoms On BB, statin Holding ASA as above   Disposition: F/u with wound visit next week, further pending site stability.   Current medicines are reviewed at length with the patient today.  The patient did not have any concerns regarding medicines.  Venetia Night, PA-C 06/07/2022 10:14 AM     [facility] [address] [place] [address] [phone] (office)  [phone] (fax)

## 2022-06-05 ENCOUNTER — Other Ambulatory Visit (HOSPITAL_COMMUNITY): Payer: Self-pay | Admitting: Emergency Medicine

## 2022-06-05 ENCOUNTER — Telehealth: Payer: Self-pay

## 2022-06-05 NOTE — Telephone Encounter (Signed)
Received call from community paramedic who is monitoring patient's wound post-op. She reports there has been some bleeding from the distal end of incision line. Patient is sleeping on the couch and on his left side, concerns that may have pulled on wound site.  There is also noted a hematoma to left of incision and under left arm. Area is sore to touch but not s/s of active infection. Transmission reviewed, appears normal device function based on how the device was initially programmed.   Reviewed image with Dr. Curt Bears. He would like for patient to come in this Thursday when Lovena Le is in office to be re-evaluated. Patient has appointment on Thursday, 06/07/22 at 10am with Tommye Standard, PA-C for wound check/interrogation. Patient given wound monitoring instructions, if bright red bleeding that cannot be stopped re-appears between now and Thursday, patient is to go to the ER. He was also given device clinic number as well to call if any questions or concerns. Infection/hematoma monitoring instructions given.

## 2022-06-05 NOTE — Progress Notes (Signed)
Paramedicine Encounter    Patient ID: Jeremy Powers, male    DOB: 1956-04-28, 67 y.o.   MRN: [id number]   Complaints No CP or SOB.  No edema.  Bleeding from ICD insertion site  Assessment A&O x 4, skin W&D w/ good color.  Compliance with meds YES  Pill box filled NO (2weeks reconciled last week)  Refills needed NONE  Meds changes since last visit NONE    Social changes NONE   BP (!) 142/70 (BP Location: Left Arm, Patient Position: Sitting, Cuff Size: Normal)   Pulse 70   Resp 14   Wt 131 lb (59.4 kg)   SpO2 97%   BMI 20.52 kg/m  Weight yesterday-not taken Last visit weight-129lb  ATF Mr. Uzelac A&O x 4, skin W&D w/ good color.  Today Mr. Moncrief advises his ICD site has been bleeding and this started around 3:00 a.m. this morning.  He states, "It doesn't really hurt very much."  I reached out to Dr. Tanna Furry office and spoke with Magdalen Spatz, RN regarding my findings.  I sent her a picture of the site and she showed it to one of the doctor's in the office.  I had the phone call on speaker so that Mr. Shouse could participate in the conversation.  He was advised to keep an eye on the area and if it continues to bleed he might need to seek further evaluation at the ED.  He currently has a follow up visit for Thursday @ 10:05 and at this time to plan on keeping this appointment.  Mr. Karl sleeps on the loveseat in the living room and it appears he's sleeping on is left side.  We discussed changing his position to sleep more on his right side and he said he would try and do that tonight.  I will reach out to him tomorrow to see how the area is doing and if it is still bleeding.  Advised him if he experiences more bleeding tonight, an increase in pain to the area that he should call 911 and seek further evaluation at the hospital.  ACTION: Home visit completed  Skipper Cliche [phone] 06/05/22  Patient Care Team: Elise Benne as PCP - General (Internal  Medicine) Haroldine Laws, Shaune Pascal, MD as PCP - Cardiology (Cardiology)  Patient Active Problem List   Diagnosis Date Noted   NICM (nonischemic cardiomyopathy) ([place]) 03/20/2022   Malnutrition of moderate degree 09/23/2021   Dyslipidemia 09/21/2021   Anemia 09/21/2021   AKI (acute kidney injury) ([place]) 09/21/2021   Essential hypertension 09/20/2021   CHF (congestive heart failure) ([place]) 05/08/2021   Acute on chronic systolic CHF (congestive heart failure) ([place]) 05/06/2021   Mitral regurgitation 12/26/2020   COVID-19 virus infection 12/24/2020   CHF exacerbation ([place]) 12/23/2020   Acute on chronic systolic HF (heart failure) ([place]) 06/24/2018   Aortic insufficiency 06/22/2018   Acute on chronic congestive heart failure ([place]) 06/22/2018   Elevated troponin 06/22/2018   CAD (coronary artery disease) 06/22/2018   Left bundle branch block 06/22/2018   History of iron deficiency anemia 06/22/2018   Acute on chronic heart failure ([place]) 06/22/2018   Chronic bilateral low back pain without sciatica 12/12/2015   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   Iron deficiency anemia 03/28/2015   Hyperlipidemia 10/12/2014    Current Outpatient Medications:    aspirin EC 81 MG tablet, Take 1 tablet (81 mg total) by mouth daily. Swallow whole., Disp: 30 tablet, Rfl: 6   atorvastatin (  LIPITOR) 40 MG tablet, TAKE ONE TABLET BY MOUTH EVERYDAY TABLET 6PM FOR CHOLESTEROL (Patient taking differently: Take 40 mg by mouth daily.), Disp: 30 tablet, Rfl: 0   carvedilol (COREG) 3.125 MG tablet, Take 1 tablet (3.125 mg total) by mouth 2 (two) times daily., Disp: 60 tablet, Rfl: 6   digoxin (LANOXIN) 0.125 MG tablet, TAKE ONE TABLET BY MOUTH DAILY (Patient taking differently: Take 0.125 mg by mouth daily.), Disp: 30 tablet, Rfl: 5   FARXIGA 10 MG TABS tablet, TAKE ONE TABLET BY MOUTH DAILY IN THE MORNING BEFORE BREAKFAST (Patient taking differently: Take 10 mg by mouth daily.), Disp: 30 tablet, Rfl: 0   ferrous  sulfate 325 (65 FE) MG tablet, TAKE ONE TABLET BY MOUTH TWO TIMES A DAY WITH MEALS (Patient taking differently: Take 325 mg by mouth 2 (two) times daily.), Disp: 180 tablet, Rfl: 7   furosemide (LASIX) 40 MG tablet, Take 1 tablet (40 mg total) by mouth daily., Disp: 90 tablet, Rfl: 3   potassium chloride SA (KLOR-CON M) 20 MEQ tablet, TAKE ONE TABLET BY MOUTH DAILY, Disp: 30 tablet, Rfl: 2   sacubitril-valsartan (ENTRESTO) 24-26 MG, Take 1 tablet by mouth 2 (two) times daily., Disp: 60 tablet, Rfl: 11   spironolactone (ALDACTONE) 25 MG tablet, Take 1 tablet (25 mg total) by mouth at bedtime., Disp: 30 tablet, Rfl: 6 No Known Allergies   Social History   Socioeconomic History   Marital status: Single    Spouse name: Not on file   Number of children: Not on file   Years of education: Not on file   Highest education level: Not on file  Occupational History   Not on file  Tobacco Use   Smoking status: Former    Packs/day: 0.50    Types: Cigarettes   Smokeless tobacco: Never  Vaping Use   Vaping Use: Never used  Substance and Sexual Activity   Alcohol use: Yes    Comment: daily   Drug use: Yes    Types: Marijuana   Sexual activity: Not on file  Other Topics Concern   Not on file  Social History Narrative   Not on file   Social Determinants of Health   Financial Resource Strain: Not on file  Food Insecurity: Food Insecurity Present (10/17/2021)   Hunger Vital Sign    Worried About Running Out of Food in the Last Year: Often true    Ran Out of Food in the Last Year: Sometimes true  Transportation Needs: Unmet Transportation Needs (04/26/2022)   PRAPARE - Hydrologist (Medical): Yes    Lack of Transportation (Non-Medical): Yes  Physical Activity: Not on file  Stress: Not on file  Social Connections: Not on file  Intimate Partner Violence: Not on file    Physical Exam      Future Appointments  Date Time Provider [place]   06/07/2022 10:05 AM Baldwin Jamaica, PA-C [facility]  08/28/2022  7:00 AM [facility] DEVICE REMOTES [facility]  09/04/2022  3:00 PM Evans Lance, MD [facility]  11/27/2022  7:00 AM [facility] DEVICE REMOTES [facility]  02/26/2023  7:00 AM [facility] DEVICE REMOTES [facility]  05/28/2023  7:00 AM [facility] DEVICE REMOTES [facility]  08/27/2023  7:00 AM [facility] DEVICE REMOTES [facility]  11/26/2023  7:00 AM [facility] DEVICE REMOTES [facility]  02/25/2024  7:00 AM [facility] DEVICE REMOTES [facility]  05/26/2024  7:00 AM [facility] DEVICE REMOTES [facility]

## 2022-06-06 ENCOUNTER — Telehealth (HOSPITAL_COMMUNITY): Payer: Self-pay | Admitting: Licensed Clinical Social Worker

## 2022-06-06 NOTE — Telephone Encounter (Signed)
Followup as scheduled. GT 

## 2022-06-06 NOTE — Telephone Encounter (Signed)
H&V Care Navigation CSW Progress Note  Clinical Social Worker informed by paramedic that pt in need of transportation assistance to get to appt tomorrow at [facility].  [place] arranged taxi to bring to appt- paramedic has spoken to pt friend to confirm details.  SDOH Screenings   Food Insecurity: Food Insecurity Present (10/17/2021)  Housing: Low Risk  (12/27/2020)  Transportation Needs: Unmet Transportation Needs (04/26/2022)  Depression (PHQ2-9): Low Risk  (06/07/2020)  Recent Concern: Depression (PHQ2-9) - Medium Risk (04/28/2020)  Tobacco Use: Medium Risk (05/29/2022)   Jorge Ny, LCSW Clinical Social Worker [facility] Desk#: [phone] Cell#: [phone]

## 2022-06-06 NOTE — Telephone Encounter (Signed)
06/06/2022  Jeremy Powers DOB: [birth year] MRN: [id number]   RIDER WAIVER AND RELEASE OF LIABILITY  For the purposes of helping with transportation needs, [facility] partners with outside transportation providers (taxi companies, [place], Social research officer, government.) to give [employer] patients or other approved people the choice of on-demand rides [employer]") to our buildings for non-emergency visits.  By using [employer], I, the person signing this document, on behalf of myself and/or any legal minors (in my care using the [employer]), agree:  Government social research officer given to me are supplied by independent, outside transportation providers who do not work for, or have any affiliation with, [employer]. [place] is not a transportation company. [place] has no control over the quality or safety of the rides I get using [employer]. [place] has no control over whether any outside ride will happen on time or not. [place] gives no guarantee on the reliability, quality, safety, or availability on any rides, or that no mistakes will happen. I know and accept that traveling by vehicle (car, truck, SVU, Lucianne Lei, bus, taxi, etc.) has risks of serious injuries such as disability, being paralyzed, and death. I know and agree the risk of using [employer] is mine alone, and not [employer]. Transport Services are provided "as is" and as are available. The transportation providers are in charge for all inspections and care of the vehicles used to provide these rides. I agree not to take legal action against [place], its agents, employees, officers, directors, representatives, insurers, attorneys, assigns, successors, subsidiaries, and affiliates at any time for any reasons related directly or indirectly to using [employer]. I also agree not to take legal action against [place] or its affiliates for any injury, death, or damage to property caused by or related to using  [employer]. I have read this Waiver and Release of Liability, and I understand the terms used in it and their legal meaning. This Waiver is freely and voluntarily given with the understanding that my right (or any legal minors) to legal action against [place] relating to [employer] is knowingly given up to use these services.   I attest that I read the Ride Waiver and Release of Liability to Jeremy Powers, gave Mr. Schildt the opportunity to ask questions and answered the questions asked (if any). I affirm that Jeremy Powers then provided consent for assistance with transportation.     Jorge Ny

## 2022-06-07 ENCOUNTER — Ambulatory Visit: Payer: 59 | Attending: Physician Assistant | Admitting: Physician Assistant

## 2022-06-07 ENCOUNTER — Encounter: Payer: Self-pay | Admitting: Physician Assistant

## 2022-06-07 ENCOUNTER — Telehealth (HOSPITAL_COMMUNITY): Payer: Self-pay | Admitting: Licensed Clinical Social Worker

## 2022-06-07 VITALS — BP 150/70 | HR 80 | Ht 67.0 in | Wt 133.4 lb

## 2022-06-07 DIAGNOSIS — I2510 Atherosclerotic heart disease of native coronary artery without angina pectoris: Secondary | ICD-10-CM

## 2022-06-07 DIAGNOSIS — Z95810 Presence of automatic (implantable) cardiac defibrillator: Secondary | ICD-10-CM

## 2022-06-07 DIAGNOSIS — I255 Ischemic cardiomyopathy: Secondary | ICD-10-CM

## 2022-06-07 DIAGNOSIS — I5022 Chronic systolic (congestive) heart failure: Secondary | ICD-10-CM

## 2022-06-07 LAB — CUP PACEART INCLINIC DEVICE CHECK
Date Time Interrogation Session: 20240118103400
HighPow Impedance: 71 Ohm
Implantable Lead Connection Status: 753985
Implantable Lead Connection Status: 753985
Implantable Lead Connection Status: 753985
Implantable Lead Implant Date: 20240108
Implantable Lead Implant Date: 20240108
Implantable Lead Implant Date: 20240108
Implantable Lead Location: 753858
Implantable Lead Location: 753859
Implantable Lead Location: 753860
Implantable Lead Model: 137
Implantable Lead Model: 4671
Implantable Lead Model: 7841
Implantable Lead Serial Number: 1396493
Implantable Lead Serial Number: 301415
Implantable Lead Serial Number: 881901
Implantable Pulse Generator Implant Date: 20240108
Lead Channel Impedance Value: 576 Ohm
Lead Channel Impedance Value: 592 Ohm
Lead Channel Impedance Value: 635 Ohm
Lead Channel Pacing Threshold Amplitude: 1 V
Lead Channel Pacing Threshold Amplitude: 1 V
Lead Channel Pacing Threshold Amplitude: 1.2 V
Lead Channel Pacing Threshold Pulse Width: 0.4 ms
Lead Channel Pacing Threshold Pulse Width: 0.4 ms
Lead Channel Pacing Threshold Pulse Width: 0.4 ms
Lead Channel Sensing Intrinsic Amplitude: 22.9 mV
Lead Channel Sensing Intrinsic Amplitude: 3.4 mV
Lead Channel Sensing Intrinsic Amplitude: 8.7 mV
Lead Channel Setting Pacing Amplitude: 3.5 V
Lead Channel Setting Pacing Amplitude: 3.5 V
Lead Channel Setting Pacing Amplitude: 3.5 V
Lead Channel Setting Pacing Pulse Width: 0.4 ms
Lead Channel Setting Pacing Pulse Width: 0.4 ms
Lead Channel Setting Sensing Sensitivity: 0.5 mV
Lead Channel Setting Sensing Sensitivity: 1 mV
Pulse Gen Serial Number: 394480

## 2022-06-07 NOTE — Telephone Encounter (Signed)
H&V Care Navigation CSW Progress Note  Clinical Social Worker consulted to assist with transport to appt with [facility] on 1/22.  CSW able to arrange through [facility].     SDOH Screenings   Food Insecurity: Food Insecurity Present (10/17/2021)  Housing: Low Risk  (12/27/2020)  Transportation Needs: Unmet Transportation Needs (06/07/2022)  Depression (PHQ2-9): Low Risk  (06/07/2020)  Recent Concern: Depression (PHQ2-9) - Medium Risk (04/28/2020)  Tobacco Use: Medium Risk (06/07/2022)    Jorge Ny, LCSW Clinical Social Worker [facility] Desk#: [phone] Cell#: [phone]

## 2022-06-07 NOTE — Patient Instructions (Addendum)
Medication Instructions:  Your physician has recommended you make the following change in your medication:   1) HOLD your aspirin (do not take until told to do so)  *If you need a refill on your cardiac medications before your next appointment, please call your pharmacy*  Lab Work: None ordered  Testing/Procedures: None ordered  Follow-Up: At [facility], you and your health needs are our priority.  As part of our continuing mission to provide you with exceptional heart care, we have created designated Provider Care Teams.  These Care Teams include your primary Cardiologist (physician) and Advanced Practice Providers (APPs -  Physician Assistants and Nurse Practitioners) who all work together to provide you with the care you need, when you need it.  Your next appointment:   4-5 day(s) with device clinic or Oda Kilts, PA-C for wound check  The format for your next appointment:   In Person  Provider:   Legrand Como "Jonni Sanger" [place] Rumple or [facility]

## 2022-06-08 ENCOUNTER — Telehealth (HOSPITAL_COMMUNITY): Payer: Self-pay | Admitting: Emergency Medicine

## 2022-06-08 NOTE — Telephone Encounter (Signed)
Called Jeremy Powers to follow up from earlier conversation.  Mr. Bulson is to discontinue ASA for the time being authority of the device clinic.  Philis Nettle said Tye [place] had removed the aspirin from his pill box  and she sent me a picture of his meds and I confirmed she had removed the correct pill.   Also reminded him he has transportation to pick him up @ 9:15 on Monday for another appointment at Dr. Tanna Furry office.    [name], [place] 06/08/2022

## 2022-06-11 ENCOUNTER — Ambulatory Visit: Payer: 59 | Attending: Student | Admitting: Student

## 2022-06-11 DIAGNOSIS — Z95810 Presence of automatic (implantable) cardiac defibrillator: Secondary | ICD-10-CM

## 2022-06-11 NOTE — Progress Notes (Signed)
Pt with persistent hematoma. Remains soft and non-tender.   Remain off ASA until 2 week recheck.  Steri-strips removed. Wound well healing.   Legrand Como [address]" [place], PA-C  06/11/2022 10:07 AM

## 2022-06-11 NOTE — Patient Instructions (Signed)
Medication Instructions:  Your physician has recommended you make the following change in your medication:   HOLD Aspirin until your next appointment  *If you need a refill on your cardiac medications before your next appointment, please call your pharmacy*   Lab Work: None If you have labs (blood work) drawn today and your tests are completely normal, you will receive your results only by: [place] (if you have MyChart) OR A paper copy in the mail If you have any lab test that is abnormal or we need to change your treatment, we will call you to review the results.   Follow-Up: At [facility], you and your health needs are our priority.  As part of our continuing mission to provide you with exceptional heart care, we have created designated Provider Care Teams.  These Care Teams include your primary Cardiologist (physician) and Advanced Practice Providers (APPs -  Physician Assistants and Nurse Practitioners) who all work together to provide you with the care you need, when you need it.  We recommend signing up for the patient portal called "MyChart".  Sign up information is provided on this After Visit Summary.  MyChart is used to connect with patients for Virtual Visits (Telemedicine).  Patients are able to view lab/test results, encounter notes, upcoming appointments, etc.  Non-urgent messages can be sent to your provider as well.   To learn more about what you can do with MyChart, go to [email or web address].    Your next appointment:   As scheduled

## 2022-06-13 ENCOUNTER — Other Ambulatory Visit (HOSPITAL_COMMUNITY): Payer: Self-pay | Admitting: Emergency Medicine

## 2022-06-13 NOTE — Progress Notes (Signed)
Paramedicine Encounter    Patient ID: Jeremy Powers, male    DOB: Jan 03, 1956, 67 y.o.   MRN: [id number]   Complaints None  Assessment A&O x 4, no chest pain or SOB.  Lung sounds clear and equal, no edema  Compliance with meds off track  Pill box filled 2 weeks reconciled pill boxes  Refills needed none  Meds changes since last visit ASA discontinued by Dr. Tanna Furry office   Social changes NONE   Wt 135 lb 6.4 oz (61.4 kg)   BMI 21.21 kg/m  Weight yesterday-not taken Last visit weight-129lb  Jeremy Powers seems to be thrown off track  with his medication regimen since the ICD insertion.  Med box reconciled x 2 weeks.  ASA has been discontinued at this time per Dr. Tanna Furry office.  On today's visit he had not taken his morning meds.  Advised him at this point just wait till this evening and take his p.m dose and get back on track with his morning meds tomorrow.  He has no complaints and expresses his desire to get back on track with his med regimen now that he's feeling better following his procedure. I will follow up with him next Wednesday to monitor his med compliance.  ACTION: Home visit completed  Skipper Cliche [phone] 06/13/22  Patient Care Team: Mackie Pai, Hershal Coria as PCP - General (Internal Medicine) Haroldine Laws Shaune Pascal, MD as PCP - Cardiology (Cardiology)  Patient Active Problem List   Diagnosis Date Noted   NICM (nonischemic cardiomyopathy) ([place]) 03/20/2022   Malnutrition of moderate degree 09/23/2021   Dyslipidemia 09/21/2021   Anemia 09/21/2021   AKI (acute kidney injury) ([place]) 09/21/2021   Essential hypertension 09/20/2021   CHF (congestive heart failure) ([place]) 05/08/2021   Acute on chronic systolic CHF (congestive heart failure) ([place]) 05/06/2021   Mitral regurgitation 12/26/2020   COVID-19 virus infection 12/24/2020   CHF exacerbation ([place]) 12/23/2020   Acute on chronic systolic HF (heart failure) ([place]) 06/24/2018   Aortic  insufficiency 06/22/2018   Acute on chronic congestive heart failure ([place]) 06/22/2018   Elevated troponin 06/22/2018   CAD (coronary artery disease) 06/22/2018   Left bundle branch block 06/22/2018   History of iron deficiency anemia 06/22/2018   Acute on chronic heart failure ([place]) 06/22/2018   Chronic bilateral low back pain without sciatica 12/12/2015   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   Iron deficiency anemia 03/28/2015   Hyperlipidemia 10/12/2014    Current Outpatient Medications:    atorvastatin (LIPITOR) 40 MG tablet, TAKE ONE TABLET BY MOUTH EVERYDAY TABLET 6PM FOR CHOLESTEROL (Patient taking differently: Take 40 mg by mouth daily.), Disp: 30 tablet, Rfl: 0   carvedilol (COREG) 3.125 MG tablet, Take 1 tablet (3.125 mg total) by mouth 2 (two) times daily., Disp: 60 tablet, Rfl: 6   digoxin (LANOXIN) 0.125 MG tablet, TAKE ONE TABLET BY MOUTH DAILY (Patient taking differently: Take 0.125 mg by mouth daily.), Disp: 30 tablet, Rfl: 5   FARXIGA 10 MG TABS tablet, TAKE ONE TABLET BY MOUTH DAILY IN THE MORNING BEFORE BREAKFAST (Patient taking differently: Take 10 mg by mouth daily.), Disp: 30 tablet, Rfl: 0   ferrous sulfate 325 (65 FE) MG tablet, TAKE ONE TABLET BY MOUTH TWO TIMES A DAY WITH MEALS (Patient taking differently: Take 325 mg by mouth 2 (two) times daily.), Disp: 180 tablet, Rfl: 7   furosemide (LASIX) 40 MG tablet, Take 1 tablet (40 mg total) by mouth daily., Disp: 90 tablet, Rfl: 3  potassium chloride SA (KLOR-CON M) 20 MEQ tablet, TAKE ONE TABLET BY MOUTH DAILY, Disp: 30 tablet, Rfl: 2   sacubitril-valsartan (ENTRESTO) 24-26 MG, Take 1 tablet by mouth 2 (two) times daily., Disp: 60 tablet, Rfl: 11   spironolactone (ALDACTONE) 25 MG tablet, Take 1 tablet (25 mg total) by mouth at bedtime., Disp: 30 tablet, Rfl: 6   aspirin EC 81 MG tablet, Take 1 tablet (81 mg total) by mouth daily. Swallow whole. (Patient not taking: Reported on 06/13/2022), Disp: 30 tablet,  Rfl: 6 No Known Allergies   Social History   Socioeconomic History   Marital status: Single    Spouse name: Not on file   Number of children: Not on file   Years of education: Not on file   Highest education level: Not on file  Occupational History   Not on file  Tobacco Use   Smoking status: Former    Packs/day: 0.50    Types: Cigarettes   Smokeless tobacco: Never  Vaping Use   Vaping Use: Never used  Substance and Sexual Activity   Alcohol use: Yes    Comment: daily   Drug use: Yes    Types: Marijuana   Sexual activity: Not on file  Other Topics Concern   Not on file  Social History Narrative   Not on file   Social Determinants of Health   Financial Resource Strain: Not on file  Food Insecurity: Food Insecurity Present (10/17/2021)   Hunger Vital Sign    Worried About Running Out of Food in the Last Year: Often true    Ran Out of Food in the Last Year: Sometimes true  Transportation Needs: Unmet Transportation Needs (06/07/2022)   PRAPARE - Hydrologist (Medical): Yes    Lack of Transportation (Non-Medical): Yes  Physical Activity: Not on file  Stress: Not on file  Social Connections: Not on file  Intimate Partner Violence: Not on file          Future Appointments  Date Time Provider [place]  06/26/2022 12:20 PM [name] Friar, PA-C [facility]  08/28/2022  7:00 AM [facility] DEVICE REMOTES [facility]  09/04/2022  3:00 PM Evans Lance, MD [facility]  11/27/2022  7:00 AM [facility] DEVICE REMOTES [facility]  02/26/2023  7:00 AM [facility] DEVICE REMOTES [facility]  05/28/2023  7:00 AM [facility] DEVICE REMOTES [facility]  08/27/2023  7:00 AM [facility] DEVICE REMOTES [facility]  11/26/2023  7:00 AM [facility] DEVICE REMOTES [facility]  02/25/2024  7:00 AM [facility] DEVICE REMOTES [facility]  05/26/2024  7:00 AM [facility] DEVICE REMOTES [facility]

## 2022-06-19 ENCOUNTER — Other Ambulatory Visit (HOSPITAL_COMMUNITY): Payer: Self-pay | Admitting: Family Medicine

## 2022-06-22 ENCOUNTER — Other Ambulatory Visit (HOSPITAL_COMMUNITY): Payer: Self-pay | Admitting: Emergency Medicine

## 2022-06-22 NOTE — Progress Notes (Signed)
Paramedicine Encounter    Patient ID: Jeremy Powers, male    DOB: [birth year], 67 y.o.   MRN: [id number]   Complaints None  Assessment A&O x 4, skin W&D w/ good color. No edema.  Lung sounds with ronchi bilat.  Compliance with meds Missed two pm doses  Pill box filled x 2 weeks  Refills needed Ferrous Sulfate  Meds changes since last visit none    Social changes none   Wt 134 lb (60.8 kg)   BMI 20.99 kg/m  Weight yesterday-not taken Last visit weight-135lb  ACTION: {Paramed Action:[phone]}  [name], [place] 06/22/22  Patient Care Team: Elise Benne as PCP - General (Internal Medicine) Haroldine Laws, Shaune Pascal, MD as PCP - Cardiology (Cardiology)  Patient Active Problem List   Diagnosis Date Noted  . NICM (nonischemic cardiomyopathy) ([place]) 03/20/2022  . Malnutrition of moderate degree 09/23/2021  . Dyslipidemia 09/21/2021  . Anemia 09/21/2021  . AKI (acute kidney injury) ([place]) 09/21/2021  . Essential hypertension 09/20/2021  . CHF (congestive heart failure) ([place]) 05/08/2021  . Acute on chronic systolic CHF (congestive heart failure) ([place]) 05/06/2021  . Mitral regurgitation 12/26/2020  . COVID-19 virus infection 12/24/2020  . CHF exacerbation ([place]) 12/23/2020  . Acute on chronic systolic HF (heart failure) ([place]) 06/24/2018  . Aortic insufficiency 06/22/2018  . Acute on chronic congestive heart failure ([place]) 06/22/2018  . Elevated troponin 06/22/2018  . CAD (coronary artery disease) 06/22/2018  . Left bundle branch block 06/22/2018  . History of iron deficiency anemia 06/22/2018  . Acute on chronic heart failure ([place]) 06/22/2018  . Chronic bilateral low back pain without sciatica 12/12/2015  . 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  . Iron deficiency anemia 03/28/2015  . Hyperlipidemia 10/12/2014    Current Outpatient Medications:  .  atorvastatin (LIPITOR) 40 MG tablet, TAKE ONE TABLET BY MOUTH EVERYDAY TABLET 6PM  FOR CHOLESTEROL, Disp: 30 tablet, Rfl: 0 .  carvedilol (COREG) 3.125 MG tablet, Take 1 tablet (3.125 mg total) by mouth 2 (two) times daily., Disp: 60 tablet, Rfl: 6 .  digoxin (LANOXIN) 0.125 MG tablet, TAKE ONE TABLET BY MOUTH DAILY (Patient taking differently: Take 0.125 mg by mouth daily.), Disp: 30 tablet, Rfl: 5 .  FARXIGA 10 MG TABS tablet, TAKE ONE TABLET BY MOUTH DAILY IN THE MORNING BEFORE BREAKFAST, Disp: 30 tablet, Rfl: 0 .  ferrous sulfate 325 (65 FE) MG tablet, TAKE ONE TABLET BY MOUTH TWO TIMES A DAY WITH MEALS (Patient taking differently: Take 325 mg by mouth 2 (two) times daily.), Disp: 180 tablet, Rfl: 7 .  furosemide (LASIX) 40 MG tablet, Take 1 tablet (40 mg total) by mouth daily., Disp: 90 tablet, Rfl: 3 .  potassium chloride SA (KLOR-CON M) 20 MEQ tablet, TAKE ONE TABLET BY MOUTH DAILY, Disp: 30 tablet, Rfl: 2 .  sacubitril-valsartan (ENTRESTO) 24-26 MG, Take 1 tablet by mouth 2 (two) times daily., Disp: 60 tablet, Rfl: 11 .  spironolactone (ALDACTONE) 25 MG tablet, Take 1 tablet (25 mg total) by mouth at bedtime., Disp: 30 tablet, Rfl: 6 .  aspirin EC 81 MG tablet, Take 1 tablet (81 mg total) by mouth daily. Swallow whole. (Patient not taking: Reported on 06/13/2022), Disp: 30 tablet, Rfl: 6 No Known Allergies   Social History   Socioeconomic History  . Marital status: Single    Spouse name: Not on file  . Number of children: Not on file  . Years of education: Not on file  . Highest education level: Not  on file  Occupational History  . Not on file  Tobacco Use  . Smoking status: Former    Packs/day: 0.50    Types: Cigarettes  . Smokeless tobacco: Never  Vaping Use  . Vaping Use: Never used  Substance and Sexual Activity  . Alcohol use: Yes    Comment: daily  . Drug use: Yes    Types: Marijuana  . Sexual activity: Not on file  Other Topics Concern  . Not on file  Social History Narrative  . Not on file   Social Determinants of Health   Financial  Resource Strain: Not on file  Food Insecurity: Food Insecurity Present (10/17/2021)   Hunger Vital Sign   . Worried About Charity fundraiser in the Last Year: Often true   . Ran Out of Food in the Last Year: Sometimes true  Transportation Needs: Unmet Transportation Needs (06/07/2022)   PRAPARE - Transportation   . Lack of Transportation (Medical): Yes   . Lack of Transportation (Non-Medical): Yes  Physical Activity: Not on file  Stress: Not on file  Social Connections: Not on file  Intimate Partner Violence: Not on file    Physical Exam      Future Appointments  Date Time Provider [place]  06/26/2022 12:20 PM [name] Friar, PA-C [facility]  08/28/2022  7:00 AM [facility] DEVICE REMOTES [facility]  09/04/2022  3:00 PM Evans Lance, MD [facility]  11/27/2022  7:00 AM [facility] DEVICE REMOTES [facility]  02/26/2023  7:00 AM [facility] DEVICE REMOTES [facility]  05/28/2023  7:00 AM [facility] DEVICE REMOTES [facility]  08/27/2023  7:00 AM [facility] DEVICE REMOTES [facility]  11/26/2023  7:00 AM [facility] DEVICE REMOTES [facility]  02/25/2024  7:00 AM [facility] DEVICE REMOTES [facility]  05/26/2024  7:00 AM [facility] DEVICE REMOTES [facility]

## 2022-06-25 ENCOUNTER — Telehealth (HOSPITAL_COMMUNITY): Payer: Self-pay | Admitting: Licensed Clinical Social Worker

## 2022-06-25 NOTE — Telephone Encounter (Signed)
CSW consulted to help with arranging transportation for pt to appt with [facility] tomorrow.  CSW attempted to call pt at all listed numbers but numbers currently stating they are not active.  CSW will inform pt's Clinical biochemist of above to see if she can assist with contacting pt before appt tomorrow.  Jorge Ny, LCSW Clinical Social Worker [facility] Desk#: [phone] Cell#: [phone]

## 2022-06-26 ENCOUNTER — Telehealth: Payer: Self-pay | Admitting: Licensed Clinical Social Worker

## 2022-06-26 ENCOUNTER — Ambulatory Visit: Payer: 59 | Attending: Student | Admitting: Student

## 2022-06-26 DIAGNOSIS — Z95810 Presence of automatic (implantable) cardiac defibrillator: Secondary | ICD-10-CM

## 2022-06-26 NOTE — Progress Notes (Signed)
Pt seen for on-going ICD site monitoring.   Hematoma has improved significantly, and is now about the size of silhouette of device head.   It does remain at least moderately sized, with two small bullae-like lesions at the lateral border of his incision.    No fever or chills.   Dr. Lovena Le in to examine. Will remain off ASA for now. Pt to call us back with any drainage, bleeding, fever, chills, or worsening hematoma.   Will follow up as scheduled.   Legrand Como [address]" [place], PA-C  06/26/2022 12:14 PM

## 2022-06-26 NOTE — Telephone Encounter (Signed)
    06/26/2022  Jeremy Powers DOB: Dec 30, 1955 MRN: [id number]   RIDER WAIVER AND RELEASE OF LIABILITY  For the purposes of helping with transportation needs, [facility] partners with outside transportation providers (taxi companies, [place], Social research officer, government.) to give [employer] patients or other approved people the choice of on-demand rides [employer]") to our buildings for non-emergency visits.  By using [employer], I, the person signing this document, on behalf of myself and/or any legal minors (in my care using the [employer]), agree:  Government social research officer given to me are supplied by independent, outside transportation providers who do not work for, or have any affiliation with, [employer]. [place] is not a transportation company. [place] has no control over the quality or safety of the rides I get using [employer]. [place] has no control over whether any outside ride will happen on time or not. [place] gives no guarantee on the reliability, quality, safety, or availability on any rides, or that no mistakes will happen. I know and accept that traveling by vehicle (car, truck, SVU, Lucianne Lei, bus, taxi, etc.) has risks of serious injuries such as disability, being paralyzed, and death. I know and agree the risk of using [employer] is mine alone, and not [employer]. Transport Services are provided "as is" and as are available. The transportation providers are in charge for all inspections and care of the vehicles used to provide these rides. I agree not to take legal action against [place], its agents, employees, officers, directors, representatives, insurers, attorneys, assigns, successors, subsidiaries, and affiliates at any time for any reasons related directly or indirectly to using [employer]. I also agree not to take legal action against  or its affiliates for any injury, death, or damage to property caused by or related to using  [employer]. I have read this Waiver and Release of Liability, and I understand the terms used in it and their legal meaning. This Waiver is freely and voluntarily given with the understanding that my right (or any legal minors) to legal action against [place] relating to [employer] is knowingly given up to use these services.   I attest that I read the Ride Waiver and Release of Liability to Jeremy Powers, gave Mr. [place]ua the opportunity to ask questions and answered the questions asked (if any). I affirm that Jeremy Powers then provided consent for assistance with transportation.     Evlyn Kanner [place]am[place]ot [place]

## 2022-06-26 NOTE — Telephone Encounter (Signed)
H&V Care Navigation CSW Progress Note  Clinical Social Worker contacted patient by phone with assistance of paramedic [place], who went to pt home as pt without phone minutes at this time due to other family expenses. I was able to complete verbal waiver with pt and schedule ride via [facility] today, pick up at 11:45. Vouchers sent to [place] and provided to clinic lead for [employer]. No additional concerns at this time- will speak with HF LCSW about assistance with minutes.   Patient is participating in a Managed Medicaid Plan:  No, UHC Medicare and Medicaid [place], MSW, [place]  [phone]- work cell phone (preferred) [phone]- desk phone

## 2022-06-26 NOTE — Telephone Encounter (Signed)
Additional phone note opened in error.   Quenna Doepke, MSW, LCSW Clinical Social Worker [facility] [place] [facility] Navigation  [phone]- work cell phone (preferred) [phone]- desk phone  

## 2022-06-26 NOTE — Telephone Encounter (Signed)
H&V Care Navigation CSW Progress Note  Clinical Social Worker contacted patient by phone as well as friend Enid Derry to arrange a ride to today's appt at [facility], LCSW at HF clinic was unable to reach yesterday. Unfortunately all listed numbers for pt and pt friend are still not accepting calls. Should pt contact team or paramedic we can assist with transportation at that time.   Patient is participating in a Managed Medicaid Plan:  No, UHC Medicare, Medicaid [place]: Food Insecurity Present (10/17/2021)  Housing: Low Risk  (12/27/2020)  Transportation Needs: Unmet Transportation Needs (06/07/2022)  Depression (PHQ2-9): Low Risk  (06/07/2020)  Recent Concern: Depression (PHQ2-9) - Medium Risk (04/28/2020)  Tobacco Use: Medium Risk (06/07/2022)   Westley Hummer, MSW, [place]  [phone]- work cell phone (preferred) [phone]- desk phone

## 2022-07-05 ENCOUNTER — Other Ambulatory Visit (HOSPITAL_COMMUNITY): Payer: Self-pay | Admitting: Emergency Medicine

## 2022-07-05 ENCOUNTER — Other Ambulatory Visit: Payer: Self-pay | Admitting: Cardiology

## 2022-07-05 NOTE — Progress Notes (Signed)
Paramedicine Encounter    Patient ID: Jeremy Powers, male    DOB: [birth year], 67 y.o.   MRN: [id number]   Complaints NONE  Assessment no chest pain, SOB, or edema.  Lung sounds with ronchi in the upper lobes Compliance with meds Missed 1 day dose  Pill box filled x 2 weeks  Refills needed Ferrous Sulfate   Meds changes since last visit none    Social changes None   BP 110/80 (BP Location: Left Arm, Patient Position: Sitting, Cuff Size: Normal)   Pulse 66   Wt 136 lb (61.7 kg)   SpO2 96%   BMI 21.30 kg/m  Weight yesterday-not taken Last visit weight-134lb  ACTION: Home visit completed  Jeremy Powers 07/06/22  Patient Care Team: Elise Benne as PCP - General (Internal Medicine) Haroldine Laws, Shaune Pascal, MD as PCP - Cardiology (Cardiology)  Patient Active Problem List   Diagnosis Date Noted   NICM (nonischemic cardiomyopathy) ([place]) 03/20/2022   Malnutrition of moderate degree 09/23/2021   Dyslipidemia 09/21/2021   Anemia 09/21/2021   AKI (acute kidney injury) ([place]) 09/21/2021   Essential hypertension 09/20/2021   CHF (congestive heart failure) ([place]) 05/08/2021   Acute on chronic systolic CHF (congestive heart failure) ([place]) 05/06/2021   Mitral regurgitation 12/26/2020   COVID-19 virus infection 12/24/2020   CHF exacerbation ([place]) 12/23/2020   Acute on chronic systolic HF (heart failure) ([place]) 06/24/2018   Aortic insufficiency 06/22/2018   Acute on chronic congestive heart failure ([place]) 06/22/2018   Elevated troponin 06/22/2018   CAD (coronary artery disease) 06/22/2018   Left bundle branch block 06/22/2018   History of iron deficiency anemia 06/22/2018   Acute on chronic heart failure ([place]) 06/22/2018   Chronic bilateral low back pain without sciatica 12/12/2015   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   Iron deficiency anemia 03/28/2015   Hyperlipidemia 10/12/2014    Current Outpatient Medications:     atorvastatin (LIPITOR) 40 MG tablet, TAKE ONE TABLET BY MOUTH EVERYDAY TABLET 6PM FOR CHOLESTEROL, Disp: 30 tablet, Rfl: 0   carvedilol (COREG) 3.125 MG tablet, Take 1 tablet (3.125 mg total) by mouth 2 (two) times daily., Disp: 60 tablet, Rfl: 6   digoxin (LANOXIN) 0.125 MG tablet, TAKE ONE TABLET BY MOUTH DAILY (Patient taking differently: Take 0.125 mg by mouth daily.), Disp: 30 tablet, Rfl: 5   FARXIGA 10 MG TABS tablet, TAKE ONE TABLET BY MOUTH DAILY IN THE MORNING BEFORE BREAKFAST, Disp: 30 tablet, Rfl: 0   furosemide (LASIX) 40 MG tablet, Take 1 tablet (40 mg total) by mouth daily., Disp: 90 tablet, Rfl: 3   potassium chloride SA (KLOR-CON M) 20 MEQ tablet, TAKE ONE TABLET BY MOUTH DAILY, Disp: 30 tablet, Rfl: 2   sacubitril-valsartan (ENTRESTO) 24-26 MG, Take 1 tablet by mouth 2 (two) times daily., Disp: 60 tablet, Rfl: 11   spironolactone (ALDACTONE) 25 MG tablet, Take 1 tablet (25 mg total) by mouth at bedtime., Disp: 30 tablet, Rfl: 6   ferrous sulfate (FEROSUL) 325 (65 FE) MG tablet, Take 1 tablet (325 mg total) by mouth 2 (two) times daily., Disp: 30 tablet, Rfl: 3 No Known Allergies   Social History   Socioeconomic History   Marital status: Single    Spouse name: Not on file   Number of children: Not on file   Years of education: Not on file   Highest education level: Not on file  Occupational History   Not on file  Tobacco Use   Smoking status:  Former    Packs/day: 0.50    Types: Cigarettes   Smokeless tobacco: Never  Vaping Use   Vaping Use: Never used  Substance and Sexual Activity   Alcohol use: Yes    Comment: daily   Drug use: Yes    Types: Marijuana   Sexual activity: Not on file  Other Topics Concern   Not on file  Social History Narrative   Not on file   Social Determinants of Health   Financial Resource Strain: Not on file  Food Insecurity: Food Insecurity Present (10/17/2021)   Hunger Vital Sign    Worried About Running Out of Food in the Last  Year: Often true    Ran Out of Food in the Last Year: Sometimes true  Transportation Needs: Unmet Transportation Needs (06/26/2022)   PRAPARE - Hydrologist (Medical): Yes    Lack of Transportation (Non-Medical): Yes  Physical Activity: Not on file  Stress: Not on file  Social Connections: Not on file  Intimate Partner Violence: Not on file    Physical Exam      Future Appointments  Date Time Provider [place]  08/28/2022  7:00 AM [facility] DEVICE REMOTES [facility]  09/04/2022  3:00 PM Evans Lance, MD [facility]  11/27/2022  7:00 AM [facility] DEVICE REMOTES [facility]  02/26/2023  7:00 AM [facility] DEVICE REMOTES [facility]  05/28/2023  7:00 AM [facility] DEVICE REMOTES [facility]  08/27/2023  7:00 AM [facility] DEVICE REMOTES [facility]  11/26/2023  7:00 AM [facility] DEVICE REMOTES [facility]  02/25/2024  7:00 AM [facility] DEVICE REMOTES [facility]  05/26/2024  7:00 AM [facility] DEVICE REMOTES [facility]

## 2022-07-06 ENCOUNTER — Telehealth (HOSPITAL_COMMUNITY): Payer: Self-pay | Admitting: Licensed Clinical Social Worker

## 2022-07-06 ENCOUNTER — Telehealth: Payer: Self-pay

## 2022-07-06 NOTE — Telephone Encounter (Signed)
CSW informed by [employer] that pt has lost his phone and will struggle financially to afford another one.  CSW attempted to call all patient numbers for emergency contacts to discuss if we are able to assist with him obtaining a new phone but all 3 numbers had error messages so CSW was unable to patient or any of his contacts to discuss further.  Will attempt to follow up next week and assist as able  Jorge Ny, [facility] Desk#: [phone] Cell#: [phone]

## 2022-07-06 NOTE — Telephone Encounter (Signed)
-----   Message from Mackie Pai, PA-C sent at 07/05/2022  5:41 PM EST ----- Regarding: FW: request prescription EMT sent this message below. I think about 2 years since I have seen pt. Please get pt scheduled for appointment by early  next week. After 2 years best to be seen in office.  Thanks, Mackie Pai, PA-C  ----- Message ----- From: [name], Paramedic Sent: 07/05/2022  11:02 AM EST To: Mackie Pai, PA-C Subject: request prescription                           I am home health paramedic for Jeremy Powers.  At today's home visit lung sounds with expiratory wheezing throughout. SpO2 96%.   I think he saw you last in May 2023.  You had prescribed him an albuterol inhaler and he was asking if he could get a refill for same.  [employer] [phone].  Thanks for your consideration,    [name], [place] 07/05/2022

## 2022-07-06 NOTE — Telephone Encounter (Signed)
Pt called and unable to lvm

## 2022-07-10 ENCOUNTER — Telehealth: Payer: Self-pay | Admitting: Medical

## 2022-07-10 NOTE — Telephone Encounter (Signed)
[place] to schedule their annual wellness visit. Appointment made for 07/18/2022.  [facility] Direct Dial: [phone]

## 2022-07-16 ENCOUNTER — Other Ambulatory Visit (HOSPITAL_COMMUNITY): Payer: Self-pay | Admitting: Emergency Medicine

## 2022-07-16 NOTE — Progress Notes (Signed)
Paramedicine Encounter    Patient ID: Jeremy Powers, male    DOB: February 23, 1956, 67 y.o.   MRN: [id number]   Complaints NONE  Assessment No chest pain or SOB.  No edema noted.  Lung sounds clear and equal bilat  Compliance with meds missed 2 am./pm doses total.  Pill box filled x 2 weeks  Refills needed Ferrous Sulfate  Meds changes since last visit NONE    Social changes NONE    BP (!) 150/70 (BP Location: Left Arm, Patient Position: Sitting, Cuff Size: Normal)   Pulse 60   Wt 135 lb (61.2 kg)   SpO2 96%   BMI 21.14 kg/m  Weight yesterday- not taken Last visit weight-136lb  Jeremy Powers continues to do well overall.  He looks great.  He has no complaints at this time.  His pressure is up during visit but he had not yet taken his morning meds at this time.   I would like to get him back into bubble packs soon.  ACTION: Home visit completed  Jeremy Powers [id number] 07/16/22  Patient Care Team: Elise Benne as PCP - General (Internal Medicine) Haroldine Laws Shaune Pascal, MD as PCP - Cardiology (Cardiology)  Patient Active Problem List   Diagnosis Date Noted   NICM (nonischemic cardiomyopathy) ([place]) 03/20/2022   Malnutrition of moderate degree 09/23/2021   Dyslipidemia 09/21/2021   Anemia 09/21/2021   AKI (acute kidney injury) ([place]) 09/21/2021   Essential hypertension 09/20/2021   CHF (congestive heart failure) ([place]) 05/08/2021   Acute on chronic systolic CHF (congestive heart failure) ([place]) 05/06/2021   Mitral regurgitation 12/26/2020   COVID-19 virus infection 12/24/2020   CHF exacerbation ([place]) 12/23/2020   Acute on chronic systolic HF (heart failure) ([place]) 06/24/2018   Aortic insufficiency 06/22/2018   Acute on chronic congestive heart failure ([place]) 06/22/2018   Elevated troponin 06/22/2018   CAD (coronary artery disease) 06/22/2018   Left bundle branch block 06/22/2018   History of iron deficiency anemia 06/22/2018   Acute on chronic heart  failure ([place]) 06/22/2018   Chronic bilateral low back pain without sciatica 12/12/2015   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   Iron deficiency anemia 03/28/2015   Hyperlipidemia 10/12/2014    Current Outpatient Medications:    atorvastatin (LIPITOR) 40 MG tablet, TAKE ONE TABLET BY MOUTH EVERYDAY TABLET 6PM FOR CHOLESTEROL, Disp: 30 tablet, Rfl: 0   carvedilol (COREG) 3.125 MG tablet, Take 1 tablet (3.125 mg total) by mouth 2 (two) times daily., Disp: 60 tablet, Rfl: 6   digoxin (LANOXIN) 0.125 MG tablet, TAKE ONE TABLET BY MOUTH DAILY (Patient taking differently: Take 0.125 mg by mouth daily.), Disp: 30 tablet, Rfl: 5   FARXIGA 10 MG TABS tablet, TAKE ONE TABLET BY MOUTH DAILY IN THE MORNING BEFORE BREAKFAST, Disp: 30 tablet, Rfl: 0   ferrous sulfate (FEROSUL) 325 (65 FE) MG tablet, Take 1 tablet (325 mg total) by mouth 2 (two) times daily., Disp: 30 tablet, Rfl: 3   furosemide (LASIX) 40 MG tablet, Take 1 tablet (40 mg total) by mouth daily., Disp: 90 tablet, Rfl: 3   potassium chloride SA (KLOR-CON M) 20 MEQ tablet, TAKE ONE TABLET BY MOUTH DAILY, Disp: 30 tablet, Rfl: 2   sacubitril-valsartan (ENTRESTO) 24-26 MG, Take 1 tablet by mouth 2 (two) times daily., Disp: 60 tablet, Rfl: 11   spironolactone (ALDACTONE) 25 MG tablet, Take 1 tablet (25 mg total) by mouth at bedtime., Disp: 30 tablet, Rfl: 6 No Known Allergies   Social  History   Socioeconomic History   Marital status: Single    Spouse name: Not on file   Number of children: Not on file   Years of education: Not on file   Highest education level: Not on file  Occupational History   Not on file  Tobacco Use   Smoking status: Former    Packs/day: 0.50    Types: Cigarettes   Smokeless tobacco: Never  Vaping Use   Vaping Use: Never used  Substance and Sexual Activity   Alcohol use: Yes    Comment: daily   Drug use: Yes    Types: Marijuana   Sexual activity: Not on file  Other Topics Concern   Not on file   Social History Narrative   Not on file   Social Determinants of Health   Financial Resource Strain: Not on file  Food Insecurity: Food Insecurity Present (10/17/2021)   Hunger Vital Sign    Worried About Running Out of Food in the Last Year: Often true    Ran Out of Food in the Last Year: Sometimes true  Transportation Needs: Unmet Transportation Needs (06/26/2022)   PRAPARE - Hydrologist (Medical): Yes    Lack of Transportation (Non-Medical): Yes  Physical Activity: Not on file  Stress: Not on file  Social Connections: Not on file  Intimate Partner Violence: Not on file    Physical Exam      Future Appointments  Date Time Provider [place]  07/18/2022  2:20 PM [facility]  08/28/2022  7:00 AM [facility] DEVICE REMOTES [facility]  09/04/2022  3:00 PM Evans Lance, MD [facility]  11/27/2022  7:00 AM [facility] DEVICE REMOTES [facility]  02/26/2023  7:00 AM [facility] DEVICE REMOTES [facility]  05/28/2023  7:00 AM [facility] DEVICE REMOTES [facility]  08/27/2023  7:00 AM [facility] DEVICE REMOTES [facility]  11/26/2023  7:00 AM [facility] DEVICE REMOTES [facility]  02/25/2024  7:00 AM [facility] DEVICE REMOTES [facility]  05/26/2024  7:00 AM [facility] DEVICE REMOTES [facility]

## 2022-07-17 ENCOUNTER — Other Ambulatory Visit (HOSPITAL_COMMUNITY): Payer: Self-pay | Admitting: Family Medicine

## 2022-07-18 ENCOUNTER — Ambulatory Visit: Payer: 59

## 2022-07-26 ENCOUNTER — Other Ambulatory Visit (HOSPITAL_COMMUNITY): Payer: Self-pay | Admitting: Emergency Medicine

## 2022-07-26 NOTE — Progress Notes (Signed)
Paramedicine Encounter    Patient ID: Jeremy Powers, male    DOB: Jan 19, 1956, 67 y.o.   MRN: [id number]   Complaints Productive cough w/ green yellow sputum, nose bleeds.  Assessment no chest pain, no SOB.  Lung sounds clear and equal throughout, no edema  Compliance with meds Missed 2 PM doses  Pill box filled x 2 weeks  Refills needed Ferrous Sulfate needs refill approved  Meds changes since last visit NONE    Social changes NONE   BP 120/70 (BP Location: Left Arm, Patient Position: Sitting)   Pulse 63   Resp 16   Wt 137 lb (62.1 kg)   SpO2 96%   BMI 21.46 kg/m  Weight yesterday-not taken Last visit weight-135lb  Jeremy Powers has no complaints today.  I noticed that he appeared a little glassy eyed and he admits that he has been drinking "a little bit" today.  He did not appear to be altered and no obvious odor of ETOH present.  He is staying on track with his meds.  Med box reconciled x 2 weeks.  Next pending appointment for his  is with Dr. Lovena Le in April.   Called in refill for Ferrous Sulfate to [employer] and they advised this needed doctor's approval for refill and will reach out to doctor for same.  ACTION: Home visit completed  Skipper Cliche [id number] 07/26/22  Patient Care Team: Elise Benne as PCP - General (Internal Medicine) Haroldine Laws Shaune Pascal, MD as PCP - Cardiology (Cardiology)  Patient Active Problem List   Diagnosis Date Noted   NICM (nonischemic cardiomyopathy) ([place]) 03/20/2022   Malnutrition of moderate degree 09/23/2021   Dyslipidemia 09/21/2021   Anemia 09/21/2021   AKI (acute kidney injury) ([place]) 09/21/2021   Essential hypertension 09/20/2021   CHF (congestive heart failure) ([place]) 05/08/2021   Acute on chronic systolic CHF (congestive heart failure) ([place]) 05/06/2021   Mitral regurgitation 12/26/2020   COVID-19 virus infection 12/24/2020   CHF exacerbation ([place]) 12/23/2020   Acute on chronic systolic HF  (heart failure) ([place]) 06/24/2018   Aortic insufficiency 06/22/2018   Acute on chronic congestive heart failure ([employer]) 06/22/2018   Elevated troponin 06/22/2018   CAD (coronary artery disease) 06/22/2018   Left bundle branch block 06/22/2018   History of iron deficiency anemia 06/22/2018   Acute on chronic heart failure ([place]) 06/22/2018   Chronic bilateral low back pain without sciatica 12/12/2015   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   Iron deficiency anemia 03/28/2015   Hyperlipidemia 10/12/2014    Current Outpatient Medications:    atorvastatin (LIPITOR) 40 MG tablet, TAKE ONE TABLET BY MOUTH EVERYDAY TABLET 6PM FOR CHOLESTEROL, Disp: 30 tablet, Rfl: 0   carvedilol (COREG) 3.125 MG tablet, Take 1 tablet (3.125 mg total) by mouth 2 (two) times daily., Disp: 60 tablet, Rfl: 6   digoxin (LANOXIN) 0.125 MG tablet, TAKE ONE TABLET BY MOUTH DAILY, Disp: 30 tablet, Rfl: 4   FARXIGA 10 MG TABS tablet, TAKE ONE TABLET BY MOUTH DAILY IN THE MORNING BEFORE BREAKFAST, Disp: 30 tablet, Rfl: 0   ferrous sulfate (FEROSUL) 325 (65 FE) MG tablet, Take 1 tablet (325 mg total) by mouth 2 (two) times daily., Disp: 30 tablet, Rfl: 3   furosemide (LASIX) 40 MG tablet, Take 1 tablet (40 mg total) by mouth daily., Disp: 90 tablet, Rfl: 3   potassium chloride SA (KLOR-CON M) 20 MEQ tablet, TAKE ONE TABLET BY MOUTH DAILY, Disp: 30 tablet, Rfl: 2  sacubitril-valsartan (ENTRESTO) 24-26 MG, Take 1 tablet by mouth 2 (two) times daily., Disp: 60 tablet, Rfl: 11   spironolactone (ALDACTONE) 25 MG tablet, Take 1 tablet (25 mg total) by mouth at bedtime., Disp: 30 tablet, Rfl: 6 No Known Allergies   Social History   Socioeconomic History   Marital status: Single    Spouse name: Not on file   Number of children: Not on file   Years of education: Not on file   Highest education level: Not on file  Occupational History   Not on file  Tobacco Use   Smoking status: Former    Packs/day: 0.50     Types: Cigarettes   Smokeless tobacco: Never  Vaping Use   Vaping Use: Never used  Substance and Sexual Activity   Alcohol use: Yes    Comment: daily   Drug use: Yes    Types: Marijuana   Sexual activity: Not on file  Other Topics Concern   Not on file  Social History Narrative   Not on file   Social Determinants of Health   Financial Resource Strain: Not on file  Food Insecurity: Food Insecurity Present (10/17/2021)   Hunger Vital Sign    Worried About Running Out of Food in the Last Year: Often true    Ran Out of Food in the Last Year: Sometimes true  Transportation Needs: Unmet Transportation Needs (06/26/2022)   PRAPARE - Hydrologist (Medical): Yes    Lack of Transportation (Non-Medical): Yes  Physical Activity: Not on file  Stress: Not on file  Social Connections: Not on file  Intimate Partner Violence: Not on file    Physical Exam      Future Appointments  Date Time Provider [place]  08/28/2022  7:00 AM [facility] DEVICE REMOTES [facility]  09/04/2022  3:00 PM Evans Lance, MD [facility]  11/27/2022  7:00 AM [facility] DEVICE REMOTES [facility]  02/26/2023  7:00 AM [facility] DEVICE REMOTES [facility]  05/28/2023  7:00 AM [facility] DEVICE REMOTES [facility]  08/27/2023  7:00 AM [facility] DEVICE REMOTES [facility]  11/26/2023  7:00 AM [facility] DEVICE REMOTES [facility]  02/25/2024  7:00 AM [facility] DEVICE REMOTES [facility]  05/26/2024  7:00 AM [facility] DEVICE REMOTES [facility]

## 2022-08-06 ENCOUNTER — Telehealth: Payer: Self-pay | Admitting: Medical

## 2022-08-06 NOTE — Telephone Encounter (Signed)
Copied from [place] [id number]. Topic: Medicare AWV >> Aug 06, 2022 10:03 AM Devoria Glassing wrote: Reason for CRM: Called patient to schedule Medicare Annual Wellness Visit (AWV). No voicemail available to leave a message.  Last date of AWV: NONE  Please schedule an appointment at any time with Beatris Ship, CMA .  If any questions, please contact me.  Thank you ,  Sherol Dade; [place] Direct Dial: [phone]

## 2022-08-08 ENCOUNTER — Other Ambulatory Visit (HOSPITAL_COMMUNITY): Payer: Self-pay | Admitting: Emergency Medicine

## 2022-08-08 NOTE — Progress Notes (Signed)
Paramedicine Encounter    Patient ID: Jeremy Powers, male    DOB: [birth year], 67 y.o.   MRN: [id number]   Complaints NONE   Assessment A&O x4, skin W&D w/ good color.  Lung sounds at baseline with ronchi throughout.    Compliance with meds missed 1 a.m. dose of meds  Pill box filled reconciled x 2 weeks  Refills needed NONE  Meds changes since last visit NONE    Social changes NONE   BP (!) 140/78 (BP Location: Left Arm, Patient Position: Sitting, Cuff Size: Normal)   Pulse 70   Resp 14   Wt 136 lb 3.2 oz (61.8 kg)   SpO2 96%   BMI 21.33 kg/m  Weight yesterday-not taken Last visit weight-137lb   During today's visit, pt w/ odor of ETOH present.  He admits to having "1 beer" prior to my arrival.  He did not appear to be altered.  He reports to be feeling well and says he's been talking daily walks around the apartment complex. Reviewed meds w/ [name] as I reconciled his pill box.  I do this frequently to try and help him learn through repetition.   I discussed with him my desire to get him back on bubble packs  w/ his meds and he reports that he is open to trying again.    ACTION: Home visit completed  Skipper Cliche [id number] 08/08/22  Patient Care Team: Elise Benne as PCP - General (Internal Medicine) Haroldine Laws Shaune Pascal, MD as PCP - Cardiology (Cardiology)  Patient Active Problem List   Diagnosis Date Noted   NICM (nonischemic cardiomyopathy) ([place]) 03/20/2022   Malnutrition of moderate degree 09/23/2021   Dyslipidemia 09/21/2021   Anemia 09/21/2021   AKI (acute kidney injury) ([place]) 09/21/2021   Essential hypertension 09/20/2021   CHF (congestive heart failure) ([place]) 05/08/2021   Acute on chronic systolic CHF (congestive heart failure) ([place]) 05/06/2021   Mitral regurgitation 12/26/2020   COVID-19 virus infection 12/24/2020   CHF exacerbation ([place]) 12/23/2020   Acute on chronic systolic HF (heart failure) ([place]) 06/24/2018   Aortic  insufficiency 06/22/2018   Acute on chronic congestive heart failure ([place]) 06/22/2018   Elevated troponin 06/22/2018   CAD (coronary artery disease) 06/22/2018   Left bundle branch block 06/22/2018   History of iron deficiency anemia 06/22/2018   Acute on chronic heart failure ([place]) 06/22/2018   Chronic bilateral low back pain without sciatica 12/12/2015   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   Iron deficiency anemia 03/28/2015   Hyperlipidemia 10/12/2014    Current Outpatient Medications:    atorvastatin (LIPITOR) 40 MG tablet, TAKE ONE TABLET BY MOUTH EVERYDAY TABLET 6PM FOR CHOLESTEROL, Disp: 30 tablet, Rfl: 0   carvedilol (COREG) 3.125 MG tablet, Take 1 tablet (3.125 mg total) by mouth 2 (two) times daily., Disp: 60 tablet, Rfl: 6   digoxin (LANOXIN) 0.125 MG tablet, TAKE ONE TABLET BY MOUTH DAILY, Disp: 30 tablet, Rfl: 4   FARXIGA 10 MG TABS tablet, TAKE ONE TABLET BY MOUTH DAILY IN THE MORNING BEFORE BREAKFAST, Disp: 30 tablet, Rfl: 0   ferrous sulfate (FEROSUL) 325 (65 FE) MG tablet, Take 1 tablet (325 mg total) by mouth 2 (two) times daily., Disp: 30 tablet, Rfl: 3   furosemide (LASIX) 40 MG tablet, Take 1 tablet (40 mg total) by mouth daily., Disp: 90 tablet, Rfl: 3   potassium chloride SA (KLOR-CON M) 20 MEQ tablet, TAKE ONE TABLET BY MOUTH DAILY, Disp: 30 tablet, Rfl:  2   sacubitril-valsartan (ENTRESTO) 24-26 MG, Take 1 tablet by mouth 2 (two) times daily., Disp: 60 tablet, Rfl: 11   spironolactone (ALDACTONE) 25 MG tablet, Take 1 tablet (25 mg total) by mouth at bedtime., Disp: 30 tablet, Rfl: 6 No Known Allergies   Social History   Socioeconomic History   Marital status: Single    Spouse name: Not on file   Number of children: Not on file   Years of education: Not on file   Highest education level: Not on file  Occupational History   Not on file  Tobacco Use   Smoking status: Former    Packs/day: .5    Types: Cigarettes   Smokeless tobacco: Never   Vaping Use   Vaping Use: Never used  Substance and Sexual Activity   Alcohol use: Yes    Comment: daily   Drug use: Yes    Types: Marijuana   Sexual activity: Not on file  Other Topics Concern   Not on file  Social History Narrative   Not on file   Social Determinants of Health   Financial Resource Strain: Not on file  Food Insecurity: Food Insecurity Present (10/17/2021)   Hunger Vital Sign    Worried About Running Out of Food in the Last Year: Often true    Ran Out of Food in the Last Year: Sometimes true  Transportation Needs: Unmet Transportation Needs (06/26/2022)   PRAPARE - Hydrologist (Medical): Yes    Lack of Transportation (Non-Medical): Yes  Physical Activity: Not on file  Stress: Not on file  Social Connections: Not on file  Intimate Partner Violence: Not on file    Physical Exam      Future Appointments  Date Time Provider [place]  08/28/2022  7:00 AM [facility] DEVICE REMOTES [facility]  09/04/2022  3:00 PM Evans Lance, MD [facility]  11/27/2022  7:00 AM [facility] DEVICE REMOTES [facility]  02/26/2023  7:00 AM [facility] DEVICE REMOTES [facility]  05/28/2023  7:00 AM [facility] DEVICE REMOTES [facility]  08/27/2023  7:00 AM [facility] DEVICE REMOTES [facility]  11/26/2023  7:00 AM [facility] DEVICE REMOTES [facility]  02/25/2024  7:00 AM [facility] DEVICE REMOTES [facility]  05/26/2024  7:00 AM [facility] DEVICE REMOTES [facility]

## 2022-08-08 NOTE — Progress Notes (Signed)
[place], [place] 08/08/2022

## 2022-08-11 ENCOUNTER — Other Ambulatory Visit (HOSPITAL_COMMUNITY): Payer: Self-pay | Admitting: Family Medicine

## 2022-08-24 ENCOUNTER — Other Ambulatory Visit (HOSPITAL_COMMUNITY): Payer: Self-pay | Admitting: Emergency Medicine

## 2022-08-24 NOTE — Progress Notes (Signed)
Paramedicine Encounter    Patient ID: Jeremy Powers, male    DOB: 1955/11/11, 67 y.o.   MRN: [id number]   Complaints Coughing and sneezing w/ headache possibly allergies  Assessment A&O x 4, skin W&D w/ good color.  No edema or JVD noted.  Compliance with meds Missed 1 p.m. dose.  Pill box filled x 1 week.  Refills needed Ferrous sulfate  Meds changes since last visit NONE    Social changes NONE   BP 126/60 (BP Location: Right Arm, Patient Position: Sitting, Cuff Size: Normal)   Pulse 73   Resp 14   Wt 130 lb 6.4 oz (59.1 kg)   SpO2 96%   BMI 20.42 kg/m  Weight yesterday not taken Last visit weight-136lb  Mr. Meece is in good spirits today.  I provided him with a new pill box today as the one he has is worn out and the compartment doors won't stay closed on some of the slots.  Med box reconciled x 1 week.  Lung sounds with ronchi throughout which is baseline for him.  He denies a productive cough and is afebrile.  He does complain of a headache  w/ a runny nose and says he will take some Tylenol and Benadryl  as he thinks it's allergy releated.   ACTION: Home visit completed  Bethanie Dicker [phone] 08/24/22  Patient Care Team: [name] as PCP - General (Internal Medicine) [name] Bevelyn Buckles, MD as PCP - Cardiology (Cardiology)  Patient Active Problem List   Diagnosis Date Noted   NICM (nonischemic cardiomyopathy) 03/20/2022   Malnutrition of moderate degree 09/23/2021   Dyslipidemia 09/21/2021   Anemia 09/21/2021   AKI (acute kidney injury) 09/21/2021   Essential hypertension 09/20/2021   CHF (congestive heart failure) 05/08/2021   Acute on chronic systolic CHF (congestive heart failure) 05/06/2021   Mitral regurgitation 12/26/2020   COVID-19 virus infection 12/24/2020   CHF exacerbation 12/23/2020   Acute on chronic systolic HF (heart failure) 06/24/2018   Aortic insufficiency 06/22/2018   Acute on chronic congestive heart  failure 06/22/2018   Elevated troponin 06/22/2018   CAD (coronary artery disease) 06/22/2018   Left bundle branch block 06/22/2018   History of iron deficiency anemia 06/22/2018   Acute on chronic heart failure 06/22/2018   Chronic bilateral low back pain without sciatica 12/12/2015   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   Iron deficiency anemia 03/28/2015   Hyperlipidemia 10/12/2014    Current Outpatient Medications:    atorvastatin (LIPITOR) 40 MG tablet, TAKE ONE TABLET BY MOUTH EVERYDAY TABLET 6PM FOR CHOLESTEROL, Disp: 30 tablet, Rfl: 0   carvedilol (COREG) 3.125 MG tablet, Take 1 tablet (3.125 mg total) by mouth 2 (two) times daily., Disp: 60 tablet, Rfl: 6   dapagliflozin propanediol (FARXIGA) 10 MG TABS tablet, TAKE ONE TABLET BY MOUTH DAILY IN THE MORNING BEFORE BREAKFAST, Disp: 30 tablet, Rfl: 0   digoxin (LANOXIN) 0.125 MG tablet, TAKE ONE TABLET BY MOUTH DAILY, Disp: 30 tablet, Rfl: 4   ferrous sulfate (FEROSUL) 325 (65 FE) MG tablet, Take 1 tablet (325 mg total) by mouth 2 (two) times daily., Disp: 30 tablet, Rfl: 3   furosemide (LASIX) 40 MG tablet, Take 1 tablet (40 mg total) by mouth daily., Disp: 90 tablet, Rfl: 3   potassium chloride SA (KLOR-CON M) 20 MEQ tablet, TAKE ONE TABLET BY MOUTH DAILY, Disp: 30 tablet, Rfl: 1   sacubitril-valsartan (ENTRESTO) 24-26 MG, Take 1 tablet by mouth 2 (two) times  daily., Disp: 60 tablet, Rfl: 11   spironolactone (ALDACTONE) 25 MG tablet, Take 1 tablet (25 mg total) by mouth at bedtime., Disp: 30 tablet, Rfl: 6 No Known Allergies   Social History   Socioeconomic History   Marital status: Single    Spouse name: Not on file   Number of children: Not on file   Years of education: Not on file   Highest education level: Not on file  Occupational History   Not on file  Tobacco Use   Smoking status: Former    Packs/day: .5    Types: Cigarettes   Smokeless tobacco: Never  Vaping Use   Vaping Use: Never used  Substance  and Sexual Activity   Alcohol use: Yes    Comment: daily   Drug use: Yes    Types: Marijuana   Sexual activity: Not on file  Other Topics Concern   Not on file  Social History Narrative   Not on file   Social Determinants of Health   Financial Resource Strain: Not on file  Food Insecurity: Food Insecurity Present (10/17/2021)   Hunger Vital Sign    Worried About Running Out of Food in the Last Year: Often true    Ran Out of Food in the Last Year: Sometimes true  Transportation Needs: Unmet Transportation Needs (06/26/2022)   PRAPARE - Administrator, Civil Service (Medical): Yes    Lack of Transportation (Non-Medical): Yes  Physical Activity: Not on file  Stress: Not on file  Social Connections: Not on file  Intimate Partner Violence: Not on file    Physical Exam      Future Appointments  Date Time Provider Department Center  08/28/2022  7:00 AM [facility] DEVICE REMOTES [facility]  09/04/2022  3:00 PM Marinus Maw, MD [facility]  11/27/2022  7:00 AM [facility] DEVICE REMOTES [facility]  02/26/2023  7:00 AM [facility] DEVICE REMOTES [facility]  05/28/2023  7:00 AM [facility] DEVICE REMOTES [facility]  08/27/2023  7:00 AM [facility] DEVICE REMOTES [facility]  11/26/2023  7:00 AM [facility] DEVICE REMOTES [facility]  02/25/2024  7:00 AM [facility] DEVICE REMOTES [facility]  05/26/2024  7:00 AM [facility] DEVICE REMOTES [facility]

## 2022-08-28 ENCOUNTER — Ambulatory Visit (INDEPENDENT_AMBULATORY_CARE_PROVIDER_SITE_OTHER): Payer: 59

## 2022-08-28 DIAGNOSIS — I428 Other cardiomyopathies: Secondary | ICD-10-CM

## 2022-08-29 LAB — CUP PACEART REMOTE DEVICE CHECK
Battery Remaining Longevity: 126 mo
Battery Remaining Percentage: 100 %
Brady Statistic RA Percent Paced: 1 %
Brady Statistic RV Percent Paced: 48 %
Date Time Interrogation Session: 20240409002000
HighPow Impedance: 71 Ohm
Implantable Lead Connection Status: 753985
Implantable Lead Connection Status: 753985
Implantable Lead Connection Status: 753985
Implantable Lead Implant Date: 20240108
Implantable Lead Implant Date: 20240108
Implantable Lead Implant Date: 20240108
Implantable Lead Location: 753858
Implantable Lead Location: 753859
Implantable Lead Location: 753860
Implantable Lead Model: 137
Implantable Lead Model: 4671
Implantable Lead Model: 7841
Implantable Lead Serial Number: 1396493
Implantable Lead Serial Number: 301415
Implantable Lead Serial Number: 881901
Implantable Pulse Generator Implant Date: 20240108
Lead Channel Impedance Value: 522 Ohm
Lead Channel Impedance Value: 570 Ohm
Lead Channel Impedance Value: 616 Ohm
Lead Channel Pacing Threshold Amplitude: 0.6 V
Lead Channel Pacing Threshold Amplitude: 0.9 V
Lead Channel Pacing Threshold Amplitude: 1 V
Lead Channel Pacing Threshold Pulse Width: 0.4 ms
Lead Channel Pacing Threshold Pulse Width: 0.4 ms
Lead Channel Pacing Threshold Pulse Width: 0.4 ms
Lead Channel Setting Pacing Amplitude: 3.5 V
Lead Channel Setting Pacing Amplitude: 3.5 V
Lead Channel Setting Pacing Amplitude: 3.5 V
Lead Channel Setting Pacing Pulse Width: 0.4 ms
Lead Channel Setting Pacing Pulse Width: 0.4 ms
Lead Channel Setting Sensing Sensitivity: 0.5 mV
Lead Channel Setting Sensing Sensitivity: 1 mV
Pulse Gen Serial Number: 394480

## 2022-08-30 ENCOUNTER — Telehealth (HOSPITAL_COMMUNITY): Payer: Self-pay | Admitting: Licensed Clinical Social Worker

## 2022-08-30 NOTE — Telephone Encounter (Signed)
HF Paramedicine Team Based Care Meeting  HF MD- NA  HF NP - Amy Clegg NP-C   [facility] HF Paramedicine  Katie Lynch [employer]  [name] Smith  [facility] admit within the last 30 days for heart failure? none  Medications concerns? Was taken off bubble packs due to some concerns with accuracy but will work towards getting patient back on.  Transportation issues ? Has unreliable transport sometimes  Education needs? Continues to need reinforcement of HF guidelines and medications but poor at retention.  SDOH - poor living environment- sleeping on the couch in a very full house.  Eligible for discharge? Think he will have chronic needs to be followed- would be high risk for readmission if discharged.  Burna Sis, LCSW Clinical Social Worker [facility] Desk#: [phone] Cell#: [phone]

## 2022-08-31 ENCOUNTER — Other Ambulatory Visit (HOSPITAL_COMMUNITY): Payer: Self-pay | Admitting: Emergency Medicine

## 2022-08-31 ENCOUNTER — Telehealth (HOSPITAL_COMMUNITY): Payer: Self-pay | Admitting: Licensed Clinical Social Worker

## 2022-08-31 NOTE — Progress Notes (Signed)
Paramedicine Encounter    Patient ID: Jeremy Powers, male    DOB: 11-24-1955, 67 y.o.   MRN: [id number]   Complaints NONE  Assessment A&O x 4, skin W&D w/ good color.    Compliance with meds missed 2 a.m. doses and 1 p.m. dose   Pill box filled x 2 weeks  Refills needed NONE  Meds changes since last visit none    Social changes none   BP 120/60 (BP Location: Left Arm, Patient Position: Sitting, Cuff Size: Normal)   Pulse 62   Resp 14   Wt 133 lb 3.2 oz (60.4 kg)   SpO2 96%   BMI 20.86 kg/m  Weight yesterday-not taken Last visit weight-130lb  Mr. Ryken reports to be feeling well.  No chest pain or SOB.  Lung sounds clear and equal bilat.  Med box reconciled x 2 weeks. Jenna to set up transportation for appoint next 2 weeks w/ Dr. Ladona Ridgel.    ACTION: Home visit completed  Bethanie Dicker [phone] 08/31/22  Patient Care Team: [name] as PCP - General (Internal Medicine) [name] Bevelyn Buckles, MD as PCP - Cardiology (Cardiology)  Patient Active Problem List   Diagnosis Date Noted   NICM (nonischemic cardiomyopathy) 03/20/2022   Malnutrition of moderate degree 09/23/2021   Dyslipidemia 09/21/2021   Anemia 09/21/2021   AKI (acute kidney injury) 09/21/2021   Essential hypertension 09/20/2021   CHF (congestive heart failure) 05/08/2021   Acute on chronic systolic CHF (congestive heart failure) 05/06/2021   Mitral regurgitation 12/26/2020   COVID-19 virus infection 12/24/2020   CHF exacerbation 12/23/2020   Acute on chronic systolic HF (heart failure) 06/24/2018   Aortic insufficiency 06/22/2018   Acute on chronic congestive heart failure 06/22/2018   Elevated troponin 06/22/2018   CAD (coronary artery disease) 06/22/2018   Left bundle branch block 06/22/2018   History of iron deficiency anemia 06/22/2018   Acute on chronic heart failure 06/22/2018   Chronic bilateral low back pain without sciatica 12/12/2015   Presence of  aortocoronary bypass graft 12/12/2015   Iron deficiency anemia 03/28/2015   Hyperlipidemia 10/12/2014    Current Outpatient Medications:    atorvastatin (LIPITOR) 40 MG tablet, TAKE ONE TABLET BY MOUTH EVERYDAY TABLET 6PM FOR CHOLESTEROL, Disp: 30 tablet, Rfl: 0   carvedilol (COREG) 3.125 MG tablet, Take 1 tablet (3.125 mg total) by mouth 2 (two) times daily., Disp: 60 tablet, Rfl: 6   dapagliflozin propanediol (FARXIGA) 10 MG TABS tablet, TAKE ONE TABLET BY MOUTH DAILY IN THE MORNING BEFORE BREAKFAST, Disp: 30 tablet, Rfl: 0   digoxin (LANOXIN) 0.125 MG tablet, TAKE ONE TABLET BY MOUTH DAILY, Disp: 30 tablet, Rfl: 4   ferrous sulfate (FEROSUL) 325 (65 FE) MG tablet, Take 1 tablet (325 mg total) by mouth 2 (two) times daily., Disp: 30 tablet, Rfl: 3   furosemide (LASIX) 40 MG tablet, Take 1 tablet (40 mg total) by mouth daily., Disp: 90 tablet, Rfl: 3   sacubitril-valsartan (ENTRESTO) 24-26 MG, Take 1 tablet by mouth 2 (two) times daily., Disp: 60 tablet, Rfl: 11   spironolactone (ALDACTONE) 25 MG tablet, Take 1 tablet (25 mg total) by mouth at bedtime., Disp: 30 tablet, Rfl: 6   potassium chloride SA (KLOR-CON M) 20 MEQ tablet, TAKE ONE TABLET BY MOUTH DAILY, Disp: 30 tablet, Rfl: 1 No Known Allergies   Social History   Socioeconomic History   Marital status: Single    Spouse name: Not on file   Number of children: Not  on file   Years of education: Not on file   Highest education level: Not on file  Occupational History   Not on file  Tobacco Use   Smoking status: Former    Packs/day: .5    Types: Cigarettes   Smokeless tobacco: Never  Vaping Use   Vaping Use: Never used  Substance and Sexual Activity   Alcohol use: Yes    Comment: daily   Drug use: Yes    Types: Marijuana   Sexual activity: Not on file  Other Topics Concern   Not on file  Social History Narrative   Not on file   Social Determinants of Health   Financial Resource Strain: Not on file  Food Insecurity:  Food Insecurity Present (10/17/2021)   Hunger Vital Sign    Worried About Running Out of Food in the Last Year: Often true    Ran Out of Food in the Last Year: Sometimes true  Transportation Needs: Unmet Transportation Needs (06/26/2022)   PRAPARE - Administrator, Civil Service (Medical): Yes    Lack of Transportation (Non-Medical): Yes  Physical Activity: Not on file  Stress: Not on file  Social Connections: Not on file  Intimate Partner Violence: Not on file    Physical Exam      Future Appointments  Date Time Provider Department Center  09/04/2022  3:00 PM Marinus Maw, MD [facility]  11/27/2022  7:00 AM [facility] DEVICE REMOTES [facility]  02/26/2023  7:00 AM [facility] DEVICE REMOTES [facility]  05/28/2023  7:00 AM [facility] DEVICE REMOTES [facility]  08/27/2023  7:00 AM [facility] DEVICE REMOTES [facility]  11/26/2023  7:00 AM [facility] DEVICE REMOTES [facility]  02/25/2024  7:00 AM [facility] DEVICE REMOTES [facility]  05/26/2024  7:00 AM [facility] DEVICE REMOTES [facility]

## 2022-08-31 NOTE — Telephone Encounter (Signed)
H&V Care Navigation CSW Progress Note  Clinical Social Worker consulted to help with transport to appt next week.  CSW able to arrange through [facility].  Pick up from home at 2pm- paramedic will inform pt of details.    SDOH Screenings   Food Insecurity: Food Insecurity Present (10/17/2021)  Housing: Low Risk  (12/27/2020)  Transportation Needs: Unmet Transportation Needs (06/26/2022)  Depression (PHQ2-9): Low Risk  (06/07/2020)  Recent Concern: Depression (PHQ2-9) - Medium Risk (04/28/2020)  Tobacco Use: Medium Risk (07/18/2022)    08/31/2022  Jeremy Powers DOB: [birth year] MRN: [id number]   RIDER WAIVER AND RELEASE OF LIABILITY  For the purposes of helping with transportation needs, [facility] partners with outside transportation providers (taxi companies, [employer], Catering manager.) to give [employer] patients or other approved people the choice of on-demand rides [employer]") to our buildings for non-emergency visits.  By using [employer], I, the person signing this document, on behalf of myself and/or any legal minors (in my care using the [employer]), agree:  Science writer given to me are supplied by independent, outside transportation providers who do not work for, or have any affiliation with, [employer]. [place] is not a transportation company. [facility] has no control over the quality or safety of the rides I get using [employer]. [place] has no control over whether any outside ride will happen on time or not. [facility] gives no guarantee on the reliability, quality, safety, or availability on any rides, or that no mistakes will happen. I know and accept that traveling by vehicle (car, truck, SVU, Zenaida Niece, bus, taxi, etc.) has risks of serious injuries such as disability, being paralyzed, and death. I know and agree the risk of using [employer] is mine alone, and not [employer]. Transport Services are provided "as is" and as are  available. The transportation providers are in charge for all inspections and care of the vehicles used to provide these rides. I agree not to take legal action against [facility], its agents, employees, officers, directors, representatives, insurers, attorneys, assigns, successors, subsidiaries, and affiliates at any time for any reasons related directly or indirectly to using [employer]. I also agree not to take legal action against [facility] or its affiliates for any injury, death, or damage to property caused by or related to using [employer]. I have read this Waiver and Release of Liability, and I understand the terms used in it and their legal meaning. This Waiver is freely and voluntarily given with the understanding that my right (or any legal minors) to legal action against [place] relating to [employer] is knowingly given up to use these services.   I attest that I read the Ride Waiver and Release of Liability to Jeremy Powers, gave Mr. Toole the opportunity to ask questions and answered the questions asked (if any). I affirm that Jeremy Powers then provided consent for assistance with transportation.     Burna Sis

## 2022-09-04 ENCOUNTER — Ambulatory Visit: Payer: 59 | Attending: Internal Medicine | Admitting: Internal Medicine

## 2022-09-04 ENCOUNTER — Telehealth (HOSPITAL_COMMUNITY): Payer: Self-pay | Admitting: Emergency Medicine

## 2022-09-04 ENCOUNTER — Encounter: Payer: Self-pay | Admitting: Internal Medicine

## 2022-09-04 VITALS — BP 146/62 | HR 66 | Ht 67.0 in | Wt 133.4 lb

## 2022-09-04 DIAGNOSIS — I447 Left bundle-branch block, unspecified: Secondary | ICD-10-CM | POA: Diagnosis not present

## 2022-09-04 DIAGNOSIS — Z95810 Presence of automatic (implantable) cardiac defibrillator: Secondary | ICD-10-CM | POA: Diagnosis not present

## 2022-09-04 DIAGNOSIS — I428 Other cardiomyopathies: Secondary | ICD-10-CM

## 2022-09-04 DIAGNOSIS — I5020 Unspecified systolic (congestive) heart failure: Secondary | ICD-10-CM | POA: Diagnosis not present

## 2022-09-04 NOTE — Telephone Encounter (Signed)
Spoke with [name] to remind him his ride for his appointment with Dr. Ladona Ridgel will pick him up at 2:00 for his 3:00 appointment.     [name], EMT-Paramedic [phone] 09/04/2022

## 2022-09-04 NOTE — Patient Instructions (Signed)
Medication Instructions:  Your physician recommends that you continue on your current medications as directed. Please refer to the Current Medication list given to you today.  *If you need a refill on your cardiac medications before your next appointment, please call your pharmacy*  Lab Work: None ordered.  If you have labs (blood work) drawn today and your tests are completely normal, you will receive your results only by: MyChart Message (if you have MyChart) OR A paper copy in the mail If you have any lab test that is abnormal or we need to change your treatment, we will call you to review the results.  Testing/Procedures: None ordered.  Follow-Up: At [facility], you and your health needs are our priority.  As part of our continuing mission to provide you with exceptional heart care, we have created designated Provider Care Teams.  These Care Teams include your primary Cardiologist (physician) and Advanced Practice Providers (APPs -  Physician Assistants and Nurse Practitioners) who all work together to provide you with the care you need, when you need it.  We recommend signing up for the patient portal called "MyChart".  Sign up information is provided on this After Visit Summary.  MyChart is used to connect with patients for Virtual Visits (Telemedicine).  Patients are able to view lab/test results, encounter notes, upcoming appointments, etc.  Non-urgent messages can be sent to your provider as well.   To learn more about what you can do with MyChart, go to [email or web address].    Your next appointment:   1 year(s)  The format for your next appointment:   In Person  Provider:   Lewayne Bunting, MD{or one of the following Advanced Practice Providers on your designated Care Team:   Francis Dowse, [place] Casimiro Needle "Mardelle Matte" Lanna Poche, [place]  Remote monitoring is used to monitor your ICD from home. This monitoring reduces the number of office visits required to check your device to one  time per year. It allows [place] to keep an eye on the functioning of your device to ensure it is working properly. You are scheduled for a device check from home on 11/27/22. You may send your transmission at any time that day. If you have a wireless device, the transmission will be sent automatically. After your physician reviews your transmission, you will receive a postcard with your next transmission date.

## 2022-09-04 NOTE — Progress Notes (Signed)
HPI [name] returns today for ICD followup. He is a pleasant 67 yo man with chronic systolic heart failure and LBBB who underwent BIV ICD insertion about 3 months ago. He has done well in the interim. He denies chest pain or sob or edema. No syncope or ICD therapies. No Known Allergies   Current Outpatient Medications  Medication Sig Dispense Refill   aspirin EC 81 MG tablet Take 81 mg by mouth daily. Swallow whole.     atorvastatin (LIPITOR) 40 MG tablet TAKE ONE TABLET BY MOUTH EVERYDAY TABLET 6PM FOR CHOLESTEROL 30 tablet 0   carvedilol (COREG) 3.125 MG tablet Take 1 tablet (3.125 mg total) by mouth 2 (two) times daily. 60 tablet 6   dapagliflozin propanediol (FARXIGA) 10 MG TABS tablet TAKE ONE TABLET BY MOUTH DAILY IN THE MORNING BEFORE BREAKFAST 30 tablet 0   digoxin (LANOXIN) 0.125 MG tablet TAKE ONE TABLET BY MOUTH DAILY 30 tablet 4   ferrous sulfate (FEROSUL) 325 (65 FE) MG tablet Take 1 tablet (325 mg total) by mouth 2 (two) times daily. 30 tablet 3   furosemide (LASIX) 40 MG tablet Take 1 tablet (40 mg total) by mouth daily. 90 tablet 3   potassium chloride SA (KLOR-CON M) 20 MEQ tablet TAKE ONE TABLET BY MOUTH DAILY 30 tablet 1   sacubitril-valsartan (ENTRESTO) 24-26 MG Take 1 tablet by mouth 2 (two) times daily. 60 tablet 11   spironolactone (ALDACTONE) 25 MG tablet Take 1 tablet (25 mg total) by mouth at bedtime. 30 tablet 6   No current facility-administered medications for this visit.     Past Medical History:  Diagnosis Date   CHF (congestive heart failure)    Cirrhosis    Coronary artery disease    Hypertension    Pancreatitis     ROS:   All systems reviewed and negative except as noted in the HPI.   Past Surgical History:  Procedure Laterality Date   BIV ICD INSERTION CRT-D N/A 05/28/2022   Procedure: BIV ICD INSERTION CRT-D;  Surgeon: Marinus Maw, MD;  [facility] INVASIVE CV LAB;  Service: Cardiovascular;  Laterality: N/A;   CARDIAC SURGERY      RIGHT/LEFT HEART CATH AND CORONARY/GRAFT ANGIOGRAPHY N/A 06/24/2018   Procedure: RIGHT/LEFT HEART CATH AND CORONARY/GRAFT ANGIOGRAPHY;  Surgeon: Dolores Patty, MD;  Location: [facility] INVASIVE CV LAB;  Service: Cardiovascular;  Laterality: N/A;     Family History  Problem Relation Age of Onset   Iron deficiency Neg Hx      Social History   Socioeconomic History   Marital status: Single    Spouse name: Not on file   Number of children: Not on file   Years of education: Not on file   Highest education level: Not on file  Occupational History   Not on file  Tobacco Use   Smoking status: Former    Packs/day: .5    Types: Cigarettes   Smokeless tobacco: Never  Vaping Use   Vaping Use: Never used  Substance and Sexual Activity   Alcohol use: Yes    Comment: daily   Drug use: Yes    Types: Marijuana   Sexual activity: Not on file  Other Topics Concern   Not on file  Social History Narrative   Not on file   Social Determinants of Health   Financial Resource Strain: Not on file  Food Insecurity: Food Insecurity Present (10/17/2021)   Hunger Vital Sign    Worried  About Running Out of Food in the Last Year: Often true    Ran Out of Food in the Last Year: Sometimes true  Transportation Needs: Unmet Transportation Needs (06/26/2022)   PRAPARE - Administrator, Civil Service (Medical): Yes    Lack of Transportation (Non-Medical): Yes  Physical Activity: Not on file  Stress: Not on file  Social Connections: Not on file  Intimate Partner Violence: Not on file     BP (!) 146/62   Pulse 66   Ht 5\' 7"  (1.702 m)   Wt 133 lb 6.4 oz (60.5 kg)   SpO2 98%   BMI 20.89 kg/m   Physical Exam:  Well appearing NAD HEENT: Unremarkable Neck:  No JVD, no thyromegally Lymphatics:  No adenopathy Back:  No CVA tenderness Lungs:  Clear with no wheezes HEART:  Regular rate rhythm, no murmurs, no rubs, no clicks Abd:  soft, positive bowel sounds, no organomegally, no  rebound, no guarding Ext:  2 plus pulses, no edema, no cyanosis, no clubbing Skin:  No rashes no nodules Neuro:  CN II through XII intact, motor grossly intact  EKG - nsr with ventricular pacing  DEVICE  Normal device function.  See PaceArt for details.   Assess/Plan: Chronic systolic heart failure - his symptoms are class 2. He will continue his current GDMT. ICD - his [employer] Biv ICD is working normally. No change.    Sharlot Gowda Aarish Rockers,MD

## 2022-09-10 ENCOUNTER — Other Ambulatory Visit (HOSPITAL_COMMUNITY): Payer: Self-pay | Admitting: Family Medicine

## 2022-09-13 ENCOUNTER — Other Ambulatory Visit (HOSPITAL_COMMUNITY): Payer: Self-pay | Admitting: Emergency Medicine

## 2022-09-13 NOTE — Progress Notes (Signed)
Paramedicine Encounter    Patient ID: Jeremy Powers, male    DOB: [birth year], 67 y.o.   MRN: [id number]   Complaints NONE  Assessment A&O x 4, skin W&D w/ good color. Lung sound clear and no edema noted.  Compliance with meds   Pill box filled x 2 weeks  Refills needed Ferrous Sulfate  Meds changes since last visit NONE    Social changes NONE   BP (!) 140/80 (BP Location: Left Arm, Patient Position: Sitting, Cuff Size: Normal)   Pulse 70   Resp 14   Wt 129 lb 9.6 oz (58.8 kg)   SpO2 97%   BMI 20.30 kg/m  Weight yesterday-not taken Last visit weight-133lb  ACTION: Home visit completed  Bethanie Dicker [phone] 09/14/22  Patient Care Team: [name] as PCP - General (Internal Medicine) [name] Bevelyn Buckles, MD as PCP - Cardiology (Cardiology)  Patient Active Problem List   Diagnosis Date Noted   Biventricular ICD (implantable cardioverter-defibrillator) in place 09/04/2022   NICM (nonischemic cardiomyopathy) (HCC) 03/20/2022   Malnutrition of moderate degree 09/23/2021   Dyslipidemia 09/21/2021   Anemia 09/21/2021   AKI (acute kidney injury) (HCC) 09/21/2021   Essential hypertension 09/20/2021   CHF (congestive heart failure) (HCC) 05/08/2021   Acute on chronic systolic CHF (congestive heart failure) (HCC) 05/06/2021   Mitral regurgitation 12/26/2020   COVID-19 virus infection 12/24/2020   CHF exacerbation (HCC) 12/23/2020   Acute on chronic systolic HF (heart failure) (HCC) 06/24/2018   Aortic insufficiency 06/22/2018   Acute on chronic congestive heart failure (HCC) 06/22/2018   Elevated troponin 06/22/2018   CAD (coronary artery disease) 06/22/2018   Left bundle branch block 06/22/2018   History of iron deficiency anemia 06/22/2018   Acute on chronic heart failure (HCC) 06/22/2018   Chronic bilateral low back pain without sciatica 12/12/2015   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   Iron deficiency anemia  03/28/2015   Hyperlipidemia 10/12/2014    Current Outpatient Medications:    aspirin EC 81 MG tablet, Take 81 mg by mouth daily. Swallow whole., Disp: , Rfl:    atorvastatin (LIPITOR) 40 MG tablet, Take 1 tablet (40 mg total) by mouth daily. NEEDS FOLLOW UP APPOINTMENT FOR ANYMORE REFILLS, Disp: 30 tablet, Rfl: 0   carvedilol (COREG) 3.125 MG tablet, Take 1 tablet (3.125 mg total) by mouth 2 (two) times daily., Disp: 60 tablet, Rfl: 6   dapagliflozin propanediol (FARXIGA) 10 MG TABS tablet, Take 1 tablet (10 mg total) by mouth daily with breakfast. NEEDS FOLLOW UP APPOINTMENT FOR ANYMORE REFILLS, Disp: 30 tablet, Rfl: 0   digoxin (LANOXIN) 0.125 MG tablet, TAKE ONE TABLET BY MOUTH DAILY, Disp: 30 tablet, Rfl: 4   ferrous sulfate (FEROSUL) 325 (65 FE) MG tablet, Take 1 tablet (325 mg total) by mouth 2 (two) times daily., Disp: 30 tablet, Rfl: 3   furosemide (LASIX) 40 MG tablet, Take 1 tablet (40 mg total) by mouth daily., Disp: 90 tablet, Rfl: 3   potassium chloride SA (KLOR-CON M) 20 MEQ tablet, TAKE ONE TABLET BY MOUTH DAILY, Disp: 30 tablet, Rfl: 1   sacubitril-valsartan (ENTRESTO) 24-26 MG, Take 1 tablet by mouth 2 (two) times daily., Disp: 60 tablet, Rfl: 11   spironolactone (ALDACTONE) 25 MG tablet, Take 1 tablet (25 mg total) by mouth at bedtime., Disp: 30 tablet, Rfl: 6 No Known Allergies   Social History   Socioeconomic History   Marital status: Single    Spouse name: Not on file  Number of children: Not on file   Years of education: Not on file   Highest education level: Not on file  Occupational History   Not on file  Tobacco Use   Smoking status: Former    Packs/day: .5    Types: Cigarettes   Smokeless tobacco: Never  Vaping Use   Vaping Use: Never used  Substance and Sexual Activity   Alcohol use: Yes    Comment: daily   Drug use: Yes    Types: Marijuana   Sexual activity: Not on file  Other Topics Concern   Not on file  Social History Narrative   Not on file    Social Determinants of Health   Financial Resource Strain: Not on file  Food Insecurity: Food Insecurity Present (10/17/2021)   Hunger Vital Sign    Worried About Running Out of Food in the Last Year: Often true    Ran Out of Food in the Last Year: Sometimes true  Transportation Needs: Unmet Transportation Needs (06/26/2022)   PRAPARE - Administrator, Civil Service (Medical): Yes    Lack of Transportation (Non-Medical): Yes  Physical Activity: Not on file  Stress: Not on file  Social Connections: Not on file  Intimate Partner Violence: Not on file    Physical Exam      Future Appointments  Date Time Provider Department Center  11/27/2022  7:00 AM [facility]ICE [facility]  02/26/2023  7:00 AM [facility]ICE [facility]  05/28/2023  7:00 AM [facility]  08/27/2023  7:00 AM [facility]ICE [facility]  11/26/2023  7:00 AM [facility]  02/25/2024  7:00 AM [facility]ICE [facility]  05/26/2024  7:00 AM [facility]ICE [facility]

## 2022-09-27 ENCOUNTER — Other Ambulatory Visit (HOSPITAL_COMMUNITY): Payer: Self-pay | Admitting: Emergency Medicine

## 2022-09-27 NOTE — Progress Notes (Signed)
Paramedicine Encounter    Patient ID: Jeremy Powers, male    DOB: 02-17-1956, 67 y.o.   MRN: [id number]   Complaints NONE  Assessment A&O x 4, skin W&D w/ good color.  Lung sounds clear and equal bilat. No edema noted  Compliance with meds Missed 1 p.m. med  Pill box filled x 2 weeks  Refills needed NONE  Meds changes since last visit NONE    Social changes NONE   BP (!) 160/70 (BP Location: Left Arm, Patient Position: Sitting, Cuff Size: Normal)   Pulse 62   Resp 14   Wt 138 lb 9.6 oz (62.9 kg)   SpO2 98%   BMI 21.71 kg/m  Weight yesterday-not taken Last visit weight- 129lb  Pt.has not complaints today.  He missed 1 p.m. dose from his med box.  He denies chest pain or SOB.  He states he has been walking around the apartment complex for exercise.  His weight is up 9lbs from last visit.  He states he has had an increased appetite and has been drinking more sodas than usual.  He shows no outward signs of excess fluid.  His abdomen is soft and not distented.  No JVD noted.  He did have odor of ETOH present but when I asked him he denied having consumed same.  I advised I will visit him again next week to follow up with weight and BP.  His BP was up today but he had not yet taken his morning meds at time of visit.    ACTION: Home visit completed  Bethanie Dicker [phone] 09/27/22  Patient Care Team: [name] as PCP - General (Internal Medicine) [name] Bevelyn Buckles, MD as PCP - Cardiology (Cardiology)  Patient Active Problem List   Diagnosis Date Noted   Biventricular ICD (implantable cardioverter-defibrillator) in place 09/04/2022   NICM (nonischemic cardiomyopathy) (HCC) 03/20/2022   Malnutrition of moderate degree 09/23/2021   Dyslipidemia 09/21/2021   Anemia 09/21/2021   AKI (acute kidney injury) (HCC) 09/21/2021   Essential hypertension 09/20/2021   CHF (congestive heart failure) (HCC) 05/08/2021   Acute on chronic systolic CHF  (congestive heart failure) (HCC) 05/06/2021   Mitral regurgitation 12/26/2020   COVID-19 virus infection 12/24/2020   CHF exacerbation (HCC) 12/23/2020   Acute on chronic systolic HF (heart failure) (HCC) 06/24/2018   Aortic insufficiency 06/22/2018   Acute on chronic congestive heart failure (HCC) 06/22/2018   Elevated troponin 06/22/2018   CAD (coronary artery disease) 06/22/2018   Left bundle branch block 06/22/2018   History of iron deficiency anemia 06/22/2018   Acute on chronic heart failure (HCC) 06/22/2018   Chronic bilateral low back pain without sciatica 12/12/2015   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   Iron deficiency anemia 03/28/2015   Hyperlipidemia 10/12/2014    Current Outpatient Medications:    aspirin EC 81 MG tablet, Take 81 mg by mouth daily. Swallow whole., Disp: , Rfl:    atorvastatin (LIPITOR) 40 MG tablet, Take 1 tablet (40 mg total) by mouth daily. NEEDS FOLLOW UP APPOINTMENT FOR ANYMORE REFILLS, Disp: 30 tablet, Rfl: 0   carvedilol (COREG) 3.125 MG tablet, Take 1 tablet (3.125 mg total) by mouth 2 (two) times daily., Disp: 60 tablet, Rfl: 6   dapagliflozin propanediol (FARXIGA) 10 MG TABS tablet, Take 1 tablet (10 mg total) by mouth daily with breakfast. NEEDS FOLLOW UP APPOINTMENT FOR ANYMORE REFILLS, Disp: 30 tablet, Rfl: 0   digoxin (LANOXIN) 0.125 MG tablet, TAKE ONE TABLET  BY MOUTH DAILY, Disp: 30 tablet, Rfl: 4   ferrous sulfate (FEROSUL) 325 (65 FE) MG tablet, Take 1 tablet (325 mg total) by mouth 2 (two) times daily., Disp: 30 tablet, Rfl: 3   furosemide (LASIX) 40 MG tablet, Take 1 tablet (40 mg total) by mouth daily., Disp: 90 tablet, Rfl: 3   potassium chloride SA (KLOR-CON M) 20 MEQ tablet, TAKE ONE TABLET BY MOUTH DAILY, Disp: 30 tablet, Rfl: 1   sacubitril-valsartan (ENTRESTO) 24-26 MG, Take 1 tablet by mouth 2 (two) times daily., Disp: 60 tablet, Rfl: 11   spironolactone (ALDACTONE) 25 MG tablet, Take 1 tablet (25 mg total) by mouth  at bedtime., Disp: 30 tablet, Rfl: 6 No Known Allergies   Social History   Socioeconomic History   Marital status: Single    Spouse name: Not on file   Number of children: Not on file   Years of education: Not on file   Highest education level: Not on file  Occupational History   Not on file  Tobacco Use   Smoking status: Former    Packs/day: .5    Types: Cigarettes   Smokeless tobacco: Never  Vaping Use   Vaping Use: Never used  Substance and Sexual Activity   Alcohol use: Yes    Comment: daily   Drug use: Yes    Types: Marijuana   Sexual activity: Not on file  Other Topics Concern   Not on file  Social History Narrative   Not on file   Social Determinants of Health   Financial Resource Strain: Not on file  Food Insecurity: Food Insecurity Present (10/17/2021)   Hunger Vital Sign    Worried About Running Out of Food in the Last Year: Often true    Ran Out of Food in the Last Year: Sometimes true  Transportation Needs: Unmet Transportation Needs (06/26/2022)   PRAPARE - Administrator, Civil Service (Medical): Yes    Lack of Transportation (Non-Medical): Yes  Physical Activity: Not on file  Stress: Not on file  Social Connections: Not on file  Intimate Partner Violence: Not on file    Physical Exam      Future Appointments  Date Time Provider Department Center  11/27/2022  7:00 AM [facility]  02/26/2023  7:00 AM [facility]  05/28/2023  7:00 AM [facility]  08/27/2023  7:00 AM [facility]  11/26/2023  7:00 AM [facility]  02/25/2024  7:00 AM [facility]  05/26/2024  7:00 AM [facility]

## 2022-10-05 ENCOUNTER — Other Ambulatory Visit (HOSPITAL_COMMUNITY): Payer: Self-pay | Admitting: Emergency Medicine

## 2022-10-05 NOTE — Progress Notes (Signed)
Paramedicine Encounter    Patient ID: Jeremy Powers, male    DOB: 08-10-1955, 67 y.o.   MRN: [id number]   Complaints NONE  Assessment   Compliance with meds: missed 1 a.m. dose  Pill box filled n/a  Refills needed NONE  Meds changes since last visit NONE    Social changes NONE   BP 130/68 (BP Location: Left Arm, Patient Position: Sitting, Cuff Size: Normal)   Pulse 60   Resp 16   Wt 138 lb (62.6 kg)   SpO2 97%   BMI 21.61 kg/m  Weight yesterday- not taken Last visit weight-138lb  Home visit today finds Jeremy Powers stating he "feels pretty good."  No chest pain, SOB, no peripheral edema.  Lung sounds clear throughout.  He had not taken his morning meds yet at time of my visit.  I reiterated to him the importance of trying to stay on schedule with morning and evening meds.    ACTION: Home visit completed  Bethanie Dicker [phone] 10/05/22  Patient Care Team: [name] as PCP - General (Internal Medicine) [name] Bevelyn Buckles, MD as PCP - Cardiology (Cardiology)  Patient Active Problem List   Diagnosis Date Noted   Biventricular ICD (implantable cardioverter-defibrillator) in place 09/04/2022   NICM (nonischemic cardiomyopathy) (HCC) 03/20/2022   Malnutrition of moderate degree 09/23/2021   Dyslipidemia 09/21/2021   Anemia 09/21/2021   AKI (acute kidney injury) (HCC) 09/21/2021   Essential hypertension 09/20/2021   CHF (congestive heart failure) (HCC) 05/08/2021   Acute on chronic systolic CHF (congestive heart failure) (HCC) 05/06/2021   Mitral regurgitation 12/26/2020   COVID-19 virus infection 12/24/2020   CHF exacerbation (HCC) 12/23/2020   Acute on chronic systolic HF (heart failure) (HCC) 06/24/2018   Aortic insufficiency 06/22/2018   Acute on chronic congestive heart failure (HCC) 06/22/2018   Elevated troponin 06/22/2018   CAD (coronary artery disease) 06/22/2018   Left bundle branch block 06/22/2018   History of iron  deficiency anemia 06/22/2018   Acute on chronic heart failure (HCC) 06/22/2018   Chronic bilateral low back pain without sciatica 12/12/2015   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   Iron deficiency anemia 03/28/2015   Hyperlipidemia 10/12/2014    Current Outpatient Medications:    aspirin EC 81 MG tablet, Take 81 mg by mouth daily. Swallow whole., Disp: , Rfl:    atorvastatin (LIPITOR) 40 MG tablet, Take 1 tablet (40 mg total) by mouth daily. NEEDS FOLLOW UP APPOINTMENT FOR ANYMORE REFILLS, Disp: 30 tablet, Rfl: 0   carvedilol (COREG) 3.125 MG tablet, Take 1 tablet (3.125 mg total) by mouth 2 (two) times daily., Disp: 60 tablet, Rfl: 6   dapagliflozin propanediol (FARXIGA) 10 MG TABS tablet, Take 1 tablet (10 mg total) by mouth daily with breakfast. NEEDS FOLLOW UP APPOINTMENT FOR ANYMORE REFILLS, Disp: 30 tablet, Rfl: 0   digoxin (LANOXIN) 0.125 MG tablet, TAKE ONE TABLET BY MOUTH DAILY, Disp: 30 tablet, Rfl: 4   ferrous sulfate (FEROSUL) 325 (65 FE) MG tablet, Take 1 tablet (325 mg total) by mouth 2 (two) times daily., Disp: 30 tablet, Rfl: 3   furosemide (LASIX) 40 MG tablet, Take 1 tablet (40 mg total) by mouth daily., Disp: 90 tablet, Rfl: 3   potassium chloride SA (KLOR-CON M) 20 MEQ tablet, TAKE ONE TABLET BY MOUTH DAILY, Disp: 30 tablet, Rfl: 1   sacubitril-valsartan (ENTRESTO) 24-26 MG, Take 1 tablet by mouth 2 (two) times daily., Disp: 60 tablet, Rfl: 11   spironolactone (ALDACTONE)  25 MG tablet, Take 1 tablet (25 mg total) by mouth at bedtime., Disp: 30 tablet, Rfl: 6 No Known Allergies   Social History   Socioeconomic History   Marital status: Single    Spouse name: Not on file   Number of children: Not on file   Years of education: Not on file   Highest education level: Not on file  Occupational History   Not on file  Tobacco Use   Smoking status: Former    Packs/day: .5    Types: Cigarettes   Smokeless tobacco: Never  Vaping Use   Vaping Use: Never  used  Substance and Sexual Activity   Alcohol use: Yes    Comment: daily   Drug use: Yes    Types: Marijuana   Sexual activity: Not on file  Other Topics Concern   Not on file  Social History Narrative   Not on file   Social Determinants of Health   Financial Resource Strain: Not on file  Food Insecurity: Food Insecurity Present (10/17/2021)   Hunger Vital Sign    Worried About Running Out of Food in the Last Year: Often true    Ran Out of Food in the Last Year: Sometimes true  Transportation Needs: Unmet Transportation Needs (06/26/2022)   PRAPARE - Administrator, Civil Service (Medical): Yes    Lack of Transportation (Non-Medical): Yes  Physical Activity: Not on file  Stress: Not on file  Social Connections: Not on file  Intimate Partner Violence: Not on file    Physical Exam      Future Appointments  Date Time Provider Department Center  11/27/2022  7:00 AM [facility]  02/26/2023  7:00 AM [facility]  05/28/2023  7:00 AM [facility]  08/27/2023  7:00 AM [facility]  11/26/2023  7:00 AM [facility]  02/25/2024  7:00 AM [facility]  05/26/2024  7:00 AM [facility]

## 2022-10-05 NOTE — Progress Notes (Signed)
Remote ICD transmission.   

## 2022-10-16 ENCOUNTER — Other Ambulatory Visit (HOSPITAL_COMMUNITY): Payer: Self-pay | Admitting: Family Medicine

## 2022-10-16 ENCOUNTER — Other Ambulatory Visit: Payer: Self-pay | Admitting: Cardiology

## 2022-10-23 ENCOUNTER — Other Ambulatory Visit (HOSPITAL_COMMUNITY): Payer: Self-pay | Admitting: Emergency Medicine

## 2022-10-23 NOTE — Progress Notes (Signed)
Paramedicine Encounter    Patient ID: Jeremy Powers, male    DOB: March 19, 1956, 67 y.o.   MRN: [id number]   Complaints None  Assessment A&O x 4, skin W&D w/ good color  Compliance with meds Missed meds x 2 days   Pill box filled x 2 weeks  Refills Ferrous Sulfate  Meds changes since last visit NONE    Social changes NONE   BP (!) 140/70 (BP Location: Left Arm, Patient Position: Sitting, Cuff Size: Normal)   Pulse 82   Resp 14   Wt 126 lb 6.4 oz (57.3 kg)   SpO2 96%   BMI 19.80 kg/m  Weight yesterday-not taken Last visit weight-138lb    ATF Jeremy Powers reporting that he feels well today.  He denies chest pain or SOB.  Lung sounds clear and equal bilat and no peripheral edema noted.  There was a 2 day gap in meds and his daughter filled med box correctly except missing [place].  An un-opened bottle of [name] was found behind the sofa and placed in box.  He was 3 tablets short on Ferrous Sulfate and refill called into pharmacy.   Pill box reconciled x 2 weeks.   Discussed with him going back on bubble packs and he agreed to try again.  Contacted [employer] [employer] and they advised bubble packs will be ready tomorrow and will be delivered.   Next home visit scheduled for Tuesday 5/11 @ 8:30.  ACTION: Home visit completed  Bethanie Dicker [phone] 10/23/22  Patient Care Team: [name] as PCP - General (Internal Medicine) [name] Bevelyn Buckles, MD as PCP - Cardiology (Cardiology)  Patient Active Problem List   Diagnosis Date Noted   Biventricular ICD (implantable cardioverter-defibrillator) in place 09/04/2022   NICM (nonischemic cardiomyopathy) (HCC) 03/20/2022   Malnutrition of moderate degree 09/23/2021   Dyslipidemia 09/21/2021   Anemia 09/21/2021   AKI (acute kidney injury) (HCC) 09/21/2021   Essential hypertension 09/20/2021   CHF (congestive heart failure) (HCC) 05/08/2021   Acute on chronic systolic CHF (congestive heart failure) (HCC)  05/06/2021   Mitral regurgitation 12/26/2020   COVID-19 virus infection 12/24/2020   CHF exacerbation (HCC) 12/23/2020   Acute on chronic systolic HF (heart failure) (HCC) 06/24/2018   Aortic insufficiency 06/22/2018   Acute on chronic congestive heart failure (HCC) 06/22/2018   Elevated troponin 06/22/2018   CAD (coronary artery disease) 06/22/2018   Left bundle branch block 06/22/2018   History of iron deficiency anemia 06/22/2018   Acute on chronic heart failure (HCC) 06/22/2018   Chronic bilateral low back pain without sciatica 12/12/2015   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   Iron deficiency anemia 03/28/2015   Hyperlipidemia 10/12/2014    Current Outpatient Medications:    aspirin EC 81 MG tablet, Take 81 mg by mouth daily. Swallow whole., Disp: , Rfl:    atorvastatin (LIPITOR) 40 MG tablet, TAKE 1 TABLET (40 MG TOTAL) BY MOUTH DAILY. NEEDS FOLLOW UP APPOINTMENT FOR ANYMORE REFILLS, Disp: 30 tablet, Rfl: 0   carvedilol (COREG) 3.125 MG tablet, Take 1 tablet (3.125 mg total) by mouth 2 (two) times daily., Disp: 60 tablet, Rfl: 6   digoxin (LANOXIN) 0.125 MG tablet, TAKE ONE TABLET BY MOUTH DAILY, Disp: 30 tablet, Rfl: 4   FARXIGA 10 MG TABS tablet, TAKE 1 TABLET (10 MG TOTAL) BY MOUTH DAILY WITH BREAKFAST. NEEDS FOLLOW UP APPOINTMENT FOR ANYMORE REFILLS, Disp: 30 tablet, Rfl: 0   FEROSUL 325 (65 Fe) MG tablet, TAKE  1 TABLET (325 MG TOTAL) BY MOUTH TWO (TWO) TIMES DAILY., Disp: 30 tablet, Rfl: 2   furosemide (LASIX) 40 MG tablet, Take 1 tablet (40 mg total) by mouth daily., Disp: 90 tablet, Rfl: 3   potassium chloride SA (KLOR-CON M) 20 MEQ tablet, TAKE ONE TABLET BY MOUTH DAILY, Disp: 30 tablet, Rfl: 0   sacubitril-valsartan (ENTRESTO) 24-26 MG, Take 1 tablet by mouth 2 (two) times daily., Disp: 60 tablet, Rfl: 11   spironolactone (ALDACTONE) 25 MG tablet, Take 1 tablet (25 mg total) by mouth at bedtime., Disp: 30 tablet, Rfl: 6 No Known Allergies   Social History    Socioeconomic History   Marital status: Single    Spouse name: Not on file   Number of children: Not on file   Years of education: Not on file   Highest education level: Not on file  Occupational History   Not on file  Tobacco Use   Smoking status: Former    Packs/day: .5    Types: Cigarettes   Smokeless tobacco: Never  Vaping Use   Vaping Use: Never used  Substance and Sexual Activity   Alcohol use: Yes    Comment: daily   Drug use: Yes    Types: Marijuana   Sexual activity: Not on file  Other Topics Concern   Not on file  Social History Narrative   Not on file   Social Determinants of Health   Financial Resource Strain: Not on file  Food Insecurity: Food Insecurity Present (10/17/2021)   Hunger Vital Sign    Worried About Running Out of Food in the Last Year: Often true    Ran Out of Food in the Last Year: Sometimes true  Transportation Needs: Unmet Transportation Needs (06/26/2022)   PRAPARE - Administrator, Civil Service (Medical): Yes    Lack of Transportation (Non-Medical): Yes  Physical Activity: Not on file  Stress: Not on file  Social Connections: Not on file  Intimate Partner Violence: Not on file    Physical Exam      Future Appointments  Date Time Provider Department Center  11/27/2022  7:00 AM [facility]  02/26/2023  7:00 AM [facility]  05/28/2023  7:00 AM [facility]  08/27/2023  7:00 AM [facility]  11/26/2023  7:00 AM [facility]  02/25/2024  7:00 AM [facility]  05/26/2024  7:00 AM [facility]

## 2022-10-24 ENCOUNTER — Telehealth (HOSPITAL_COMMUNITY): Payer: Self-pay | Admitting: Emergency Medicine

## 2022-10-24 NOTE — Telephone Encounter (Signed)
Called and Spoke to [place] this morning to advise her that Mr. Coates should be receiving bubble packs from [employer]  Advised her he is to continue to take his meds from his pill box until I do the next home visit with him.  She advised she understood and would tell him same.    [name], EMT-Paramedic [phone] 10/24/2022

## 2022-11-06 ENCOUNTER — Other Ambulatory Visit (HOSPITAL_COMMUNITY): Payer: Self-pay | Admitting: Family Medicine

## 2022-11-06 ENCOUNTER — Other Ambulatory Visit (HOSPITAL_COMMUNITY): Payer: Self-pay | Admitting: Internal Medicine

## 2022-11-14 ENCOUNTER — Other Ambulatory Visit (HOSPITAL_COMMUNITY): Payer: Self-pay | Admitting: Emergency Medicine

## 2022-11-14 NOTE — Progress Notes (Signed)
Paramedicine Encounter    Patient ID: Jeremy Powers, male    DOB: 25-Jan-1956, 67 y.o.   MRN: [id number]   Complaints NONE  Assessment***  Compliance with meds Missed 4 Potassium  Pill box filled 2 weeks  Refills needed Ferrous Sulfate  Meds changes since last visit NONE    Social changes NONE   There were no vitals taken for this visit. Weight yesterday-not taken Last visit weight-125lb  ACTION: {Paramed Action:[phone]}  [name], EMT-Paramedic [phone] 11/14/22  Patient Care Team: [name] as PCP - General (Internal Medicine) Bensimhon, Bevelyn Buckles, MD as PCP - Cardiology (Cardiology)  Patient Active Problem List   Diagnosis Date Noted  . Biventricular ICD (implantable cardioverter-defibrillator) in place 09/04/2022  . NICM (nonischemic cardiomyopathy) (HCC) 03/20/2022  . Malnutrition of moderate degree 09/23/2021  . Dyslipidemia 09/21/2021  . Anemia 09/21/2021  . AKI (acute kidney injury) (HCC) 09/21/2021  . Essential hypertension 09/20/2021  . CHF (congestive heart failure) (HCC) 05/08/2021  . Acute on chronic systolic CHF (congestive heart failure) (HCC) 05/06/2021  . Mitral regurgitation 12/26/2020  . COVID-19 virus infection 12/24/2020  . CHF exacerbation (HCC) 12/23/2020  . Acute on chronic systolic HF (heart failure) (HCC) 06/24/2018  . Aortic insufficiency 06/22/2018  . Acute on chronic congestive heart failure (HCC) 06/22/2018  . Elevated troponin 06/22/2018  . CAD (coronary artery disease) 06/22/2018  . Left bundle branch block 06/22/2018  . History of iron deficiency anemia 06/22/2018  . Acute on chronic heart failure (HCC) 06/22/2018  . Chronic bilateral low back pain without sciatica 12/12/2015  . 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  . Iron deficiency anemia 03/28/2015  . Hyperlipidemia 10/12/2014    Current Outpatient Medications:  .  aspirin EC 81 MG tablet, Take 1 tablet (81 mg total) by mouth daily.  NEEDS FOLLOW UP APPOINTMENT FOR MORE REFILLS, Disp: 30 tablet, Rfl: 0 .  atorvastatin (LIPITOR) 40 MG tablet, TAKE 1 TABLET (40 MG TOTAL) BY MOUTH DAILY. NEEDS FOLLOW UP APPOINTMENT FOR ANYMORE REFILLS, Disp: 30 tablet, Rfl: 0 .  carvedilol (COREG) 3.125 MG tablet, Take 1 tablet (3.125 mg total) by mouth 2 (two) times daily. NEEDS APPOINTMENT FOR MORE REFILLS, Disp: 60 tablet, Rfl: 0 .  digoxin (LANOXIN) 0.125 MG tablet, TAKE ONE TABLET BY MOUTH DAILY, Disp: 30 tablet, Rfl: 4 .  FARXIGA 10 MG TABS tablet, TAKE 1 TABLET (10 MG TOTAL) BY MOUTH DAILY WITH BREAKFAST. NEEDS FOLLOW UP APPOINTMENT FOR ANYMORE REFILLS, Disp: 30 tablet, Rfl: 0 .  FEROSUL 325 (65 Fe) MG tablet, TAKE 1 TABLET (325 MG TOTAL) BY MOUTH TWO (TWO) TIMES DAILY., Disp: 30 tablet, Rfl: 2 .  furosemide (LASIX) 40 MG tablet, Take 1 tablet (40 mg total) by mouth daily., Disp: 90 tablet, Rfl: 3 .  potassium chloride SA (KLOR-CON M) 20 MEQ tablet, TAKE ONE TABLET BY MOUTH DAILY, Disp: 30 tablet, Rfl: 0 .  sacubitril-valsartan (ENTRESTO) 24-26 MG, Take 1 tablet by mouth 2 (two) times daily., Disp: 60 tablet, Rfl: 11 .  spironolactone (ALDACTONE) 25 MG tablet, Take 1 tablet (25 mg total) by mouth at bedtime. NEEDS APPOINTMENT FOR MORE REFILLS, Disp: 30 tablet, Rfl: 0 No Known Allergies   Social History   Socioeconomic History  . Marital status: Single    Spouse name: Not on file  . Number of children: Not on file  . Years of education: Not on file  . Highest education level: Not on file  Occupational History  . Not on file  Tobacco Use  . Smoking status: Former    Packs/day: .5    Types: Cigarettes  . Smokeless tobacco: Never  Vaping Use  . Vaping Use: Never used  Substance and Sexual Activity  . Alcohol use: Yes    Comment: daily  . Drug use: Yes    Types: Marijuana  . Sexual activity: Not on file  Other Topics Concern  . Not on file  Social History Narrative  . Not on file   Social Determinants of Health    Financial Resource Strain: Not on file  Food Insecurity: Food Insecurity Present (10/17/2021)   Hunger Vital Sign   . Worried About Programme researcher, broadcasting/film/video in the Last Year: Often true   . Ran Out of Food in the Last Year: Sometimes true  Transportation Needs: Unmet Transportation Needs (06/26/2022)   PRAPARE - Transportation   . Lack of Transportation (Medical): Yes   . Lack of Transportation (Non-Medical): Yes  Physical Activity: Not on file  Stress: Not on file  Social Connections: Not on file  Intimate Partner Violence: Not on file    Physical Exam      Future Appointments  Date Time Provider Department Center  11/27/2022  7:00 AM [facility] DEVICE REMOTES [facility]  02/26/2023  7:00 AM [facility] DEVICE REMOTES [facility]  05/28/2023  7:00 AM [facility]ICE [facility]  08/27/2023  7:00 AM [facility] DEVICE [facility]OTES [facility]  11/26/2023  7:00 AM [facility]ICE [facility]OTES [facility]  02/25/2024  7:00 AM [facility] DEVICE REMOTES [facility]  05/26/2024  7:00 AM [facility] DEVICE REMOTES [facility]

## 2022-11-27 ENCOUNTER — Ambulatory Visit (INDEPENDENT_AMBULATORY_CARE_PROVIDER_SITE_OTHER): Payer: 59

## 2022-11-27 DIAGNOSIS — I428 Other cardiomyopathies: Secondary | ICD-10-CM

## 2022-11-27 LAB — CUP PACEART REMOTE DEVICE CHECK
Battery Remaining Longevity: 132 mo
Battery Remaining Percentage: 100 %
Brady Statistic RA Percent Paced: 1 %
Brady Statistic RV Percent Paced: 33 %
Date Time Interrogation Session: 20240709002100
HighPow Impedance: 74 Ohm
Implantable Lead Connection Status: 753985
Implantable Lead Connection Status: 753985
Implantable Lead Connection Status: 753985
Implantable Lead Implant Date: 20240108
Implantable Lead Implant Date: 20240108
Implantable Lead Implant Date: 20240108
Implantable Lead Location: 753858
Implantable Lead Location: 753859
Implantable Lead Location: 753860
Implantable Lead Model: 137
Implantable Lead Model: 4671
Implantable Lead Model: 7841
Implantable Lead Serial Number: 1396493
Implantable Lead Serial Number: 301415
Implantable Lead Serial Number: 881901
Implantable Pulse Generator Implant Date: 20240108
Lead Channel Impedance Value: 518 Ohm
Lead Channel Impedance Value: 536 Ohm
Lead Channel Impedance Value: 604 Ohm
Lead Channel Pacing Threshold Amplitude: 0.6 V
Lead Channel Pacing Threshold Amplitude: 0.9 V
Lead Channel Pacing Threshold Amplitude: 1.1 V
Lead Channel Pacing Threshold Pulse Width: 0.4 ms
Lead Channel Pacing Threshold Pulse Width: 0.4 ms
Lead Channel Pacing Threshold Pulse Width: 0.4 ms
Lead Channel Setting Pacing Amplitude: 2.5 V
Lead Channel Setting Pacing Amplitude: 2.5 V
Lead Channel Setting Pacing Amplitude: 2.5 V
Lead Channel Setting Pacing Pulse Width: 0.4 ms
Lead Channel Setting Pacing Pulse Width: 0.4 ms
Lead Channel Setting Sensing Sensitivity: 0.5 mV
Lead Channel Setting Sensing Sensitivity: 1 mV
Pulse Gen Serial Number: 394480

## 2022-11-28 ENCOUNTER — Other Ambulatory Visit (HOSPITAL_COMMUNITY): Payer: Self-pay | Admitting: Emergency Medicine

## 2022-11-28 NOTE — Progress Notes (Signed)
Paramedicine Encounter    Patient ID: Jeremy Powers, male    DOB: 12-Jan-1956, 67 y.o.   MRN: [id number]   Complaints NONE  Assessment A&O x4, skin W&D w/ good color.  Lung sounds clear and equal throughout.  No edema noted  Compliance with meds Missed 1 a.m. dose and 3 p.m. dose  Pill box filled x 2 weeks  Refills needed NONE  Meds changes since last visit NONE    Social changes NONE   BP 120/60 (BP Location: Left Arm, Patient Position: Sitting, Cuff Size: Normal)   Pulse 66   Resp 14   Wt 129 lb (58.5 kg)   SpO2 96%   BMI 20.20 kg/m  Weight yesterday-not taken Last visit weight-124lb   ATF Mr. Kurtzman reporting to feeling well.  He denies chest pain or SOB.  He states he drinks about 2 beers every other day.  Discussed decreasing drinking.  He denies smoking cigarette and on occasion smokes pot but says he hasn't smoked any lately.   Advised pt has a [facility] appt. 7/30 @ 9:00 and gave him an appointment card from clinic.  He will need transportation scheduled when visit gets closer.   ACTION: Home visit completed  Bethanie Dicker [phone] 11/28/22  Patient Care Team: [name] as PCP - General (Internal Medicine) [name] Bevelyn Buckles, MD as PCP - Cardiology (Cardiology)  Patient Active Problem List   Diagnosis Date Noted   Biventricular ICD (implantable cardioverter-defibrillator) in place 09/04/2022   NICM (nonischemic cardiomyopathy) (HCC) 03/20/2022   Malnutrition of moderate degree 09/23/2021   Dyslipidemia 09/21/2021   Anemia 09/21/2021   AKI (acute kidney injury) (HCC) 09/21/2021   Essential hypertension 09/20/2021   CHF (congestive heart failure) (HCC) 05/08/2021   Acute on chronic systolic CHF (congestive heart failure) (HCC) 05/06/2021   Mitral regurgitation 12/26/2020   COVID-19 virus infection 12/24/2020   CHF exacerbation (HCC) 12/23/2020   Acute on chronic systolic HF (heart failure) (HCC) 06/24/2018   Aortic  insufficiency 06/22/2018   Acute on chronic congestive heart failure (HCC) 06/22/2018   Elevated troponin 06/22/2018   CAD (coronary artery disease) 06/22/2018   Left bundle branch block 06/22/2018   History of iron deficiency anemia 06/22/2018   Acute on chronic heart failure (HCC) 06/22/2018   Chronic bilateral low back pain without sciatica 12/12/2015   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   Iron deficiency anemia 03/28/2015   Hyperlipidemia 10/12/2014    Current Outpatient Medications:    aspirin EC 81 MG tablet, Take 1 tablet (81 mg total) by mouth daily. NEEDS FOLLOW UP APPOINTMENT FOR MORE REFILLS, Disp: 30 tablet, Rfl: 0   atorvastatin (LIPITOR) 40 MG tablet, TAKE 1 TABLET (40 MG TOTAL) BY MOUTH DAILY. NEEDS FOLLOW UP APPOINTMENT FOR ANYMORE REFILLS, Disp: 30 tablet, Rfl: 0   carvedilol (COREG) 3.125 MG tablet, Take 1 tablet (3.125 mg total) by mouth 2 (two) times daily. NEEDS APPOINTMENT FOR MORE REFILLS, Disp: 60 tablet, Rfl: 0   digoxin (LANOXIN) 0.125 MG tablet, TAKE ONE TABLET BY MOUTH DAILY, Disp: 30 tablet, Rfl: 4   FARXIGA 10 MG TABS tablet, TAKE 1 TABLET (10 MG TOTAL) BY MOUTH DAILY WITH BREAKFAST. NEEDS FOLLOW UP APPOINTMENT FOR ANYMORE REFILLS, Disp: 30 tablet, Rfl: 0   FEROSUL 325 (65 Fe) MG tablet, TAKE 1 TABLET (325 MG TOTAL) BY MOUTH TWO (TWO) TIMES DAILY., Disp: 30 tablet, Rfl: 2   furosemide (LASIX) 40 MG tablet, Take 1 tablet (40 mg total) by  mouth daily., Disp: 90 tablet, Rfl: 3   potassium chloride SA (KLOR-CON M) 20 MEQ tablet, TAKE ONE TABLET BY MOUTH DAILY, Disp: 30 tablet, Rfl: 0   sacubitril-valsartan (ENTRESTO) 24-26 MG, Take 1 tablet by mouth 2 (two) times daily., Disp: 60 tablet, Rfl: 11   spironolactone (ALDACTONE) 25 MG tablet, Take 1 tablet (25 mg total) by mouth at bedtime. NEEDS APPOINTMENT FOR MORE REFILLS, Disp: 30 tablet, Rfl: 0 No Known Allergies   Social History   Socioeconomic History   Marital status: Single    Spouse name:  Not on file   Number of children: Not on file   Years of education: Not on file   Highest education level: Not on file  Occupational History   Not on file  Tobacco Use   Smoking status: Former    Packs/day: .5    Types: Cigarettes   Smokeless tobacco: Never  Vaping Use   Vaping Use: Never used  Substance and Sexual Activity   Alcohol use: Yes    Comment: daily   Drug use: Yes    Types: Marijuana   Sexual activity: Not on file  Other Topics Concern   Not on file  Social History Narrative   Not on file   Social Determinants of Health   Financial Resource Strain: Not on file  Food Insecurity: Food Insecurity Present (10/17/2021)   Hunger Vital Sign    Worried About Running Out of Food in the Last Year: Often true    Ran Out of Food in the Last Year: Sometimes true  Transportation Needs: Unmet Transportation Needs (06/26/2022)   PRAPARE - Administrator, Civil Service (Medical): Yes    Lack of Transportation (Non-Medical): Yes  Physical Activity: Not on file  Stress: Not on file  Social Connections: Not on file  Intimate Partner Violence: Not on file    Physical Exam      Future Appointments  Date Time Provider Department Center  12/18/2022  9:00 AM [facility] PA/NP [facility] None  02/26/2023  7:00 AM [facility] DEVICE REMOTES [facility]  05/28/2023  7:00 AM [facility]ICE REMOTES [facility]  08/27/2023  7:00 AM [facility] DEVICE REMOTES [facility]  11/26/2023  7:00 AM [facility] DEVICE REMOTES [facility]  02/25/2024  7:00 AM [facility]ICE [facility]ES [facility]  05/26/2024  7:00 AM [facility]ICE [facility]ES [facility]

## 2022-12-05 ENCOUNTER — Other Ambulatory Visit (HOSPITAL_COMMUNITY): Payer: Self-pay | Admitting: Internal Medicine

## 2022-12-05 ENCOUNTER — Other Ambulatory Visit (HOSPITAL_COMMUNITY): Payer: Self-pay | Admitting: Family Medicine

## 2022-12-11 ENCOUNTER — Other Ambulatory Visit (HOSPITAL_COMMUNITY): Payer: Self-pay | Admitting: Emergency Medicine

## 2022-12-11 NOTE — Progress Notes (Signed)
Paramedicine Encounter    Patient ID: Jeremy Powers, male    DOB: 11-23-1955, 67 y.o.   MRN: [id number]   Complaints NONE    Assessment A&O x 4, skin W&D w/ good color.  Lung sounds clear throughout and no peripheral edema noted  Compliance with meds Missed 4 doses a.m. and p.m.  He says he got "confused"  Pill box filled x 1 week  Refills needed NONE  Meds changes since last visit NONE    Social changes NONE  Pill box reconciled x 1 week.  Have been deconstructing bubble packs back into box.  Reviewed again how to use the pill box.   Jeremy Powers denies chest pain or SOB.  Lung sounds clear and equal throughout.  No peripheral edema noted.  Overall he looks good.  He has an upcoming appointment at the HF clinic I will reach out to [facility] to get that scheduled.   BP (!) 138/50 (BP Location: Left Arm, Patient Position: Sitting, Cuff Size: Normal)   Pulse 60   Wt 129 lb 9.6 oz (58.8 kg)   SpO2 97%   BMI 20.30 kg/m  Weight yesterday- Not taken Last visit weight-129lb     ACTION: Home visit completed  Jeremy Powers [phone] 12/13/22  Patient Care Team: Jeremy Powers as PCP - General (Internal Medicine) Jeremy Romney Bevelyn Buckles, MD as PCP - Cardiology (Cardiology)  Patient Active Problem List   Diagnosis Date Noted   Biventricular ICD (implantable cardioverter-defibrillator) in place 09/04/2022   NICM (nonischemic cardiomyopathy) (HCC) 03/20/2022   Malnutrition of moderate degree 09/23/2021   Dyslipidemia 09/21/2021   Anemia 09/21/2021   AKI (acute kidney injury) (HCC) 09/21/2021   Essential hypertension 09/20/2021   CHF (congestive heart failure) (HCC) 05/08/2021   Acute on chronic systolic CHF (congestive heart failure) (HCC) 05/06/2021   Mitral regurgitation 12/26/2020   COVID-19 virus infection 12/24/2020   CHF exacerbation (HCC) 12/23/2020   Acute on chronic systolic HF (heart failure) (HCC) 06/24/2018   Aortic insufficiency 06/22/2018    Acute on chronic congestive heart failure (HCC) 06/22/2018   Elevated troponin 06/22/2018   CAD (coronary artery disease) 06/22/2018   Left bundle branch block 06/22/2018   History of iron deficiency anemia 06/22/2018   Acute on chronic heart failure (HCC) 06/22/2018   Chronic bilateral low back pain without sciatica 12/12/2015   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   Iron deficiency anemia 03/28/2015   Hyperlipidemia 10/12/2014    Current Outpatient Medications:    aspirin EC 81 MG tablet, TAKE 1 TABLET (81 MG TOTAL) BY MOUTH DAILY. NEEDS FOLLOW UP APPOINTMENT FOR MORE REFILLS, Disp: 30 tablet, Rfl: 0   atorvastatin (LIPITOR) 40 MG tablet, TAKE 1 TABLET (40 MG TOTAL) BY MOUTH DAILY. NEEDS FOLLOW UP APPOINTMENT FOR ANYMORE REFILLS, Disp: 30 tablet, Rfl: 0   carvedilol (COREG) 3.125 MG tablet, TAKE 1 TABLET (3.125 MG TOTAL) BY MOUTH TWO (TWO) TIMES DAILY. NEEDS APPOINTMENT FOR MORE REFILLS, Disp: 60 tablet, Rfl: 0   digoxin (LANOXIN) 0.125 MG tablet, TAKE ONE TABLET BY MOUTH DAILY, Disp: 30 tablet, Rfl: 3   ENTRESTO 24-26 MG, TAKE ONE TABLET BY MOUTH TWICE A DAY, Disp: 60 tablet, Rfl: 10   FARXIGA 10 MG TABS tablet, TAKE 1 TABLET (10 MG TOTAL) BY MOUTH DAILY WITH BREAKFAST. NEEDS FOLLOW UP APPOINTMENT FOR ANYMORE REFILLS, Disp: 30 tablet, Rfl: 0   FEROSUL 325 (65 Fe) MG tablet, TAKE 1 TABLET (325 MG TOTAL) BY MOUTH TWO (TWO) TIMES DAILY.,  Disp: 30 tablet, Rfl: 2   furosemide (LASIX) 40 MG tablet, Take 1 tablet (40 mg total) by mouth daily., Disp: 90 tablet, Rfl: 3   potassium chloride SA (KLOR-CON M) 20 MEQ tablet, TAKE ONE TABLET BY MOUTH DAILY, Disp: 30 tablet, Rfl: 0   spironolactone (ALDACTONE) 25 MG tablet, TAKE 1 TABLET (25 MG TOTAL) BY MOUTH AT BEDTIME. NEEDS APPOINTMENT FOR MORE REFILLS, Disp: 30 tablet, Rfl: 0 No Known Allergies   Social History   Socioeconomic History   Marital status: Single    Spouse name: Not on file   Number of children: Not on file   Years  of education: Not on file   Highest education level: Not on file  Occupational History   Not on file  Tobacco Use   Smoking status: Former    Current packs/day: 0.50    Types: Cigarettes   Smokeless tobacco: Never  Vaping Use   Vaping status: Never Used  Substance and Sexual Activity   Alcohol use: Yes    Comment: daily   Drug use: Yes    Types: Marijuana   Sexual activity: Not on file  Other Topics Concern   Not on file  Social History Narrative   Not on file   Social Determinants of Health   Financial Resource Strain: Not on file  Food Insecurity: Food Insecurity Present (10/17/2021)   Hunger Vital Sign    Worried About Running Out of Food in the Last Year: Often true    Ran Out of Food in the Last Year: Sometimes true  Transportation Needs: Unmet Transportation Needs (12/13/2022)   PRAPARE - Administrator, Civil Service (Medical): Yes    Lack of Transportation (Non-Medical): Yes  Physical Activity: Not on file  Stress: Not on file  Social Connections: Not on file  Intimate Partner Violence: Not on file    Physical Exam      Future Appointments  Date Time Provider Department Center  12/18/2022  9:00 AM [facility] PA/NP [facility] None  02/26/2023  7:00 AM [facility] DEVICE REMOTES [facility]  05/28/2023  7:00 AM [facility] DEVICE REMOTES [facility]  08/27/2023  7:00 AM [facility] DEVICE REMOTES [facility]  11/26/2023  7:00 AM [facility] DEVICE REMOTES [facility]  02/25/2024  7:00 AM [facility] DEVICE REMOTES [facility]  05/26/2024  7:00 AM [facility] DEVICE REMOTES [facility]

## 2022-12-13 ENCOUNTER — Telehealth (HOSPITAL_COMMUNITY): Payer: Self-pay | Admitting: Licensed Clinical Social Worker

## 2022-12-13 NOTE — Telephone Encounter (Signed)
H&V Care Navigation CSW Progress Note  Clinical Social Worker informed by [employer] that pt in need of transportation for appt next week.  CSW called pt and unable to reach so called pt dtr who confirms pt needs transport arranged.  CSW able to set up taxi to get to appt- pick up from home 8:15am.   SDOH Screenings   Food Insecurity: Food Insecurity Present (10/17/2021)  Housing: Low Risk  (12/27/2020)  Transportation Needs: Unmet Transportation Needs (12/13/2022)  Depression (PHQ2-9): Low Risk  (06/07/2020)  Recent Concern: Depression (PHQ2-9) - Medium Risk (04/28/2020)  Tobacco Use: Medium Risk (09/04/2022)   12/13/2022  Jeremy Powers DOB: 09/28/1955 MRN: [id number]   RIDER WAIVER AND RELEASE OF LIABILITY  For the purposes of helping with transportation needs, [facility] partners with outside transportation providers (taxi companies, [place], Catering manager.) to give [employer] patients or other approved people the choice of on-demand rides [employer]") to our buildings for non-emergency visits.  By using [employer], I, the person signing this document, on behalf of myself and/or any legal minors (in my care using the [employer]), agree:  Science writer given to me are supplied by independent, outside transportation providers who do not work for, or have any affiliation with, [employer]. [place] is not a transportation company. [facility] has no control over the quality or safety of the rides I get using [employer]. [facility] has no control over whether any outside ride will happen on time or not. [facility] gives no guarantee on the reliability, quality, safety, or availability on any rides, or that no mistakes will happen. I know and accept that traveling by vehicle (car, truck, [employer], Zenaida Niece, bus, taxi, etc.) has risks of serious injuries such as disability, being paralyzed, and death. I know and agree the risk of using [employer] is mine  alone, and not [employer]. Transport Services are provided "as is" and as are available. The transportation providers are in charge for all inspections and care of the vehicles used to provide these rides. I agree not to take legal action against [facility], its agents, employees, officers, directors, representatives, insurers, attorneys, assigns, successors, subsidiaries, and affiliates at any time for any reasons related directly or indirectly to using [employer]. I also agree not to take legal action against [facility] or its affiliates for any injury, death, or damage to property caused by or related to using [employer]. I have read this Waiver and Release of Liability, and I understand the terms used in it and their legal meaning. This Waiver is freely and voluntarily given with the understanding that my right (or any legal minors) to legal action against [facility] relating to [employer] is knowingly given up to use these services.   I attest that I read the Ride Waiver and Release of Liability to Jeremy Powers, gave [name] the opportunity to ask questions and answered the questions asked (if any). I affirm that Jeremy Powers then provided consent for assistance with transportation.     Burna Sis

## 2022-12-16 IMAGING — DX DG CHEST 1V PORT
1 series · 2 of 2 positions shown · non-contrast
Comparison: 12/23/2020, 10/24/2020

CLINICAL DATA: Shortness of breath

EXAM:
PORTABLE CHEST 1 VIEW

[Series 1: chest ap · 0.14mm/px · 2 of 2 slices shown]
[im 1/2]
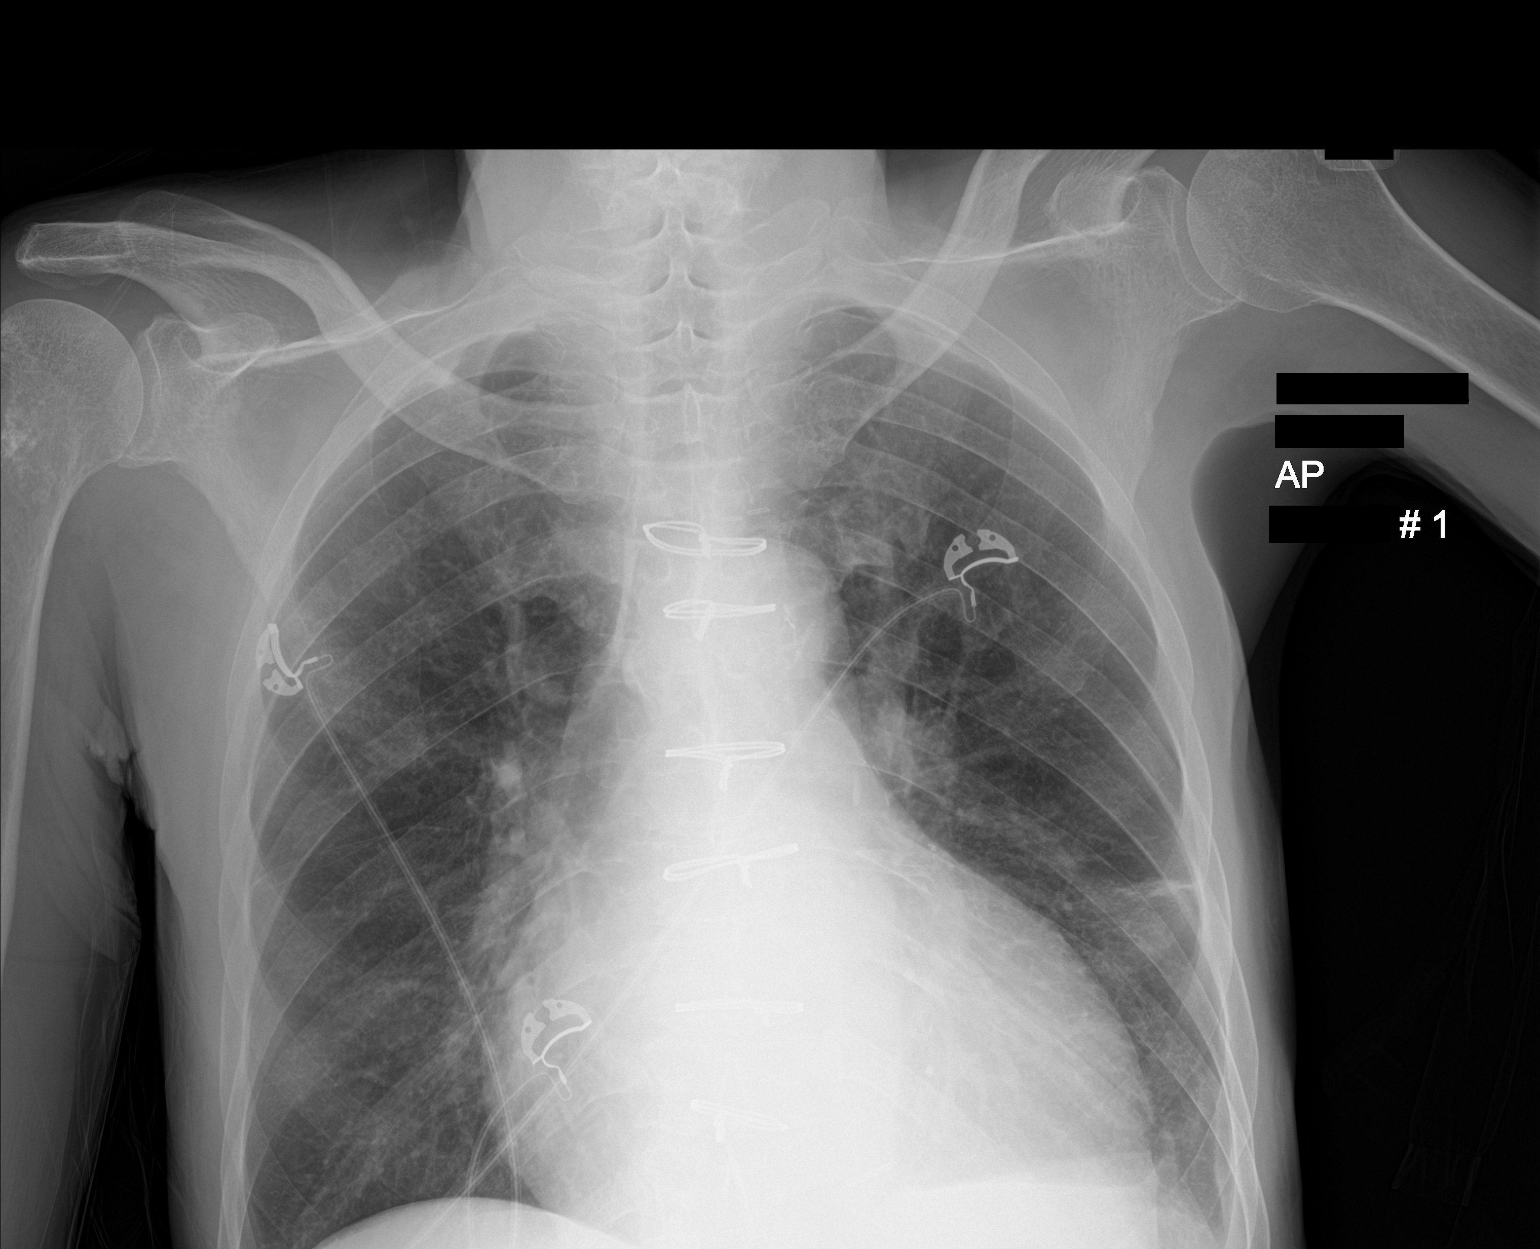
[im 2/2]
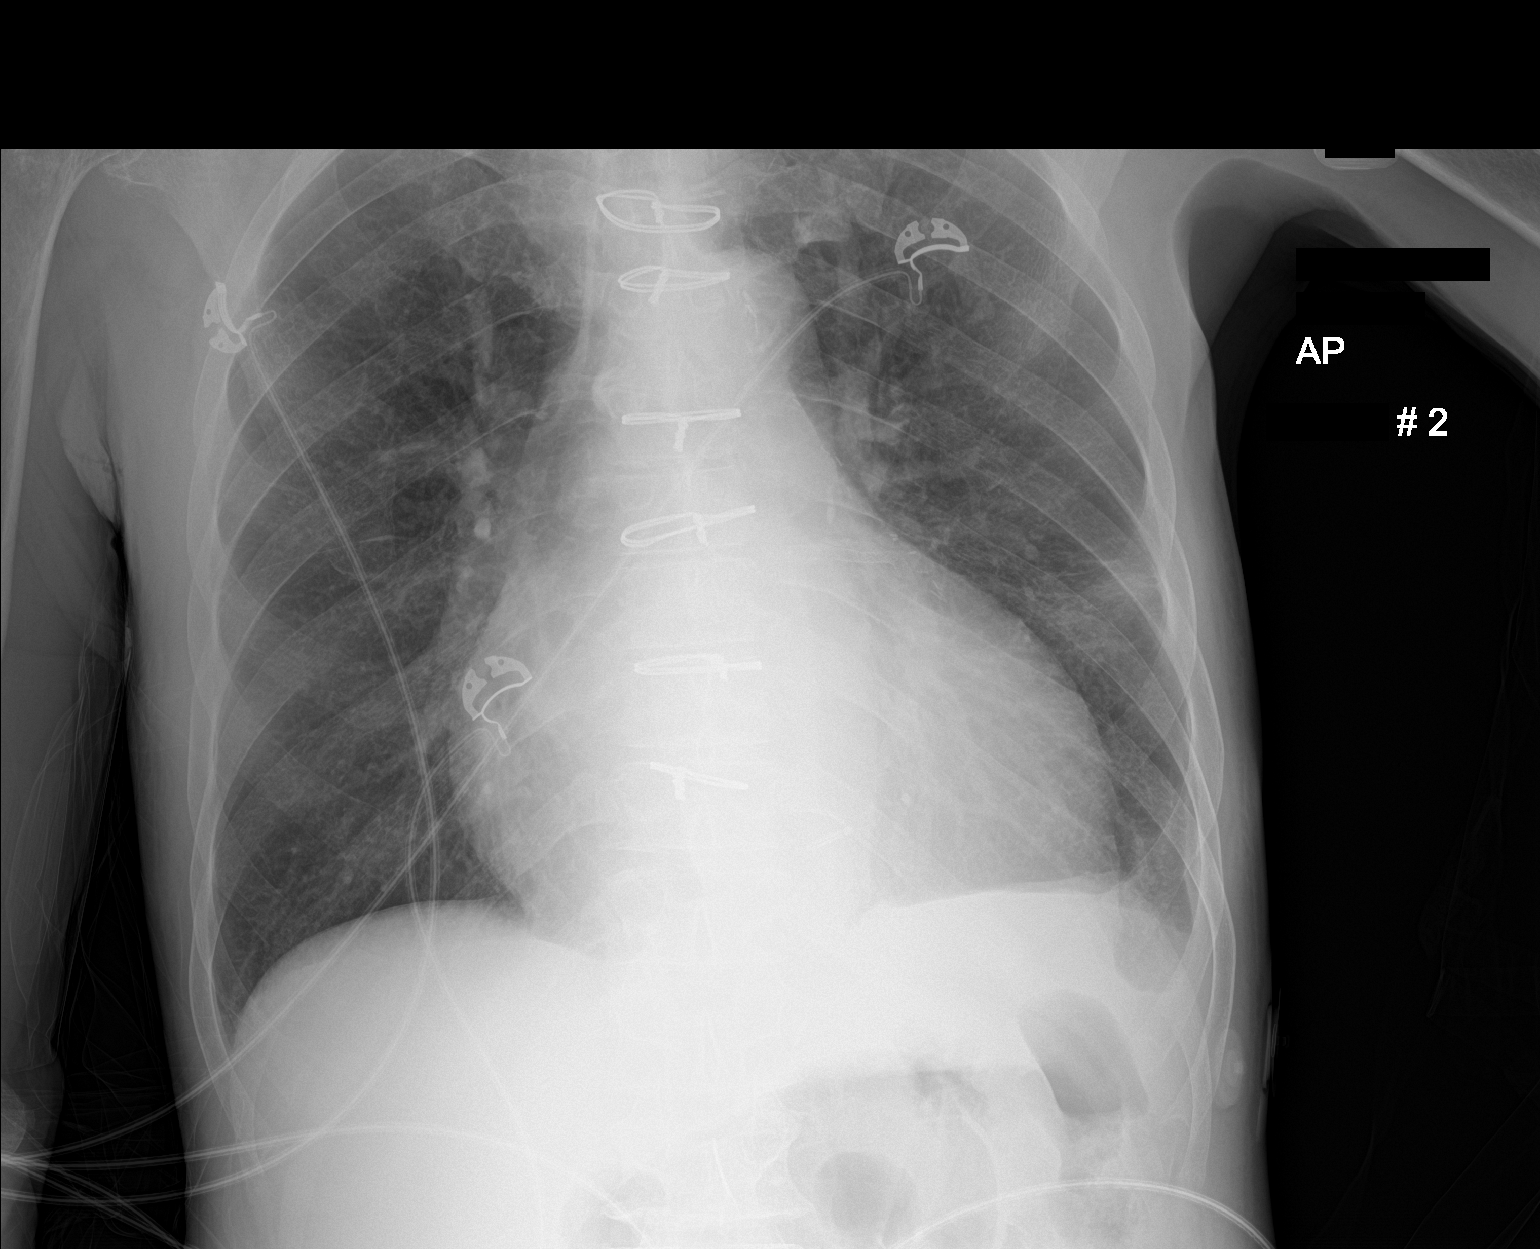

[2 of 2 positions shown; findings below may reference images not displayed]

FINDINGS: Post sternotomy changes. Cardiomegaly with small left effusion.
Patchy airspace disease at the left mid lung.
IMPRESSION: 1. Cardiomegaly with small left effusion
2. Patchy atelectasis or minimal infiltrate in the left mid to lower
lung

## 2022-12-17 ENCOUNTER — Other Ambulatory Visit (HOSPITAL_COMMUNITY): Payer: Self-pay

## 2022-12-17 ENCOUNTER — Telehealth (HOSPITAL_COMMUNITY): Payer: Self-pay | Admitting: Licensed Clinical Social Worker

## 2022-12-17 NOTE — Telephone Encounter (Signed)
Called to confirm/remind patient of their appointment at [facility] on 12/18/22.   Patient reminded to bring all medications and/or complete list.  Confirmed patient has transportation. Gave directions, instructed to utilize valet parking.  Confirmed appointment prior to ending call.

## 2022-12-17 NOTE — Telephone Encounter (Signed)
CSW called pt to remind of appt tomorrow and [employer] taxi pick up- no answer- left VM.  CSW called pt dtr and she confirmed that they were aware of the ride and no barriers to pick up.  Will continue to follow and assist as needed  Burna Sis, LCSW Clinical Social Worker [facility] Desk#: [phone] Cell#: [phone]

## 2022-12-17 NOTE — Progress Notes (Signed)
Advanced [facility] Failure Clinic Note  Date:  12/18/2022   PCP:  [name], PA-C  HF Cardiologist:  Arvilla Meres, MD  Chief Complaint: HF Follow up   HPI: Jeremy Powers is a 67 y.o.male CAD s/p PCI to LAD in 2015 and 2016, s/p CABG 2017, systolic HF EF 20-25%, microcytic/iron deficiency anemia followed by hematology and GI, LBBB, and tobacco use. Illiterate and cannot read.   In 2/20 he presented to [facility] ED with SOB, cough, and orthopnea in setting of med non-compliance. Was unable to afford copays with Medicaid. He was tachycardic and tachypneic on arrival, and was transferred to [facility] 06/22/18 for further evaluation. Echo showed newly decreased EF of 25-30% from 50-55% in 12/2017. Underwent R/LHC showing severe 1 v disease with occluded LAD within previous stent. RCA and LCX normal. LIMA to LAD and SVG to D2 patent. Cath also showed low filling pressures with moderately low cardiac output. Meds adjusted as tolerated.   OV 04/05/20 returns for HF follow up after not being seen in > 1 year. Followed by HF Paramedicine. Lasix was stopped & [name] was started.  Echo 2/22 LVEF 20-25%, RV ok.    3/22 he was discharged from community paramedicine program and bubble pill packs arranged.   Seen in the ED 10/25/20 for CHF exacerbation in the setting of medication non-compliance. He was diuresed with IV lasix and discharged home. Sent to the ED 12/23/20 by his PCP and found to be A/C CHF and COVID +. Repeat echo EF 10-15%. Diuresed 13 lbs, beta blocker stopped with acute decompensation, digoxin added, and Entresto held with low BP.  Admitted 12/22 with A/C CHF after stopping his medications a month prior. Echo showed EF 20-25%. He was diuresed with IV lasix and GDMT restarted.   Out of HF meds at follow up, volume up. Meds restarted and paramedicine arranged.  Admitted 5/23 with a/c CHF, due to non-compliance with medications. Diuresed with IV lasix. Hospitalization complicated by AKI. GDMT  restarted after SCr improved. Discharged home, weight 138 lbs.  S/p BiV 1/24.  Today he returns for HF follow up with [name]. Overall feeling fine. He has SOB if he rushes, no issues with ADLs and house chores. Walks to the store and around his apartment daily. Feels occasional palpitations. Denies CP, dizziness, edema, or PND/Orthopnea. Appetite ok. No fever or chills. Weight at home 129 pounds. Got off track with meds, but doing better now with pillbox. Drinks 1-2 beers/day.   Cardiac Studies:  - Echo (12/22): EF 20-25%, global HK, grade II DD, RV moderately reduced, moderate MR  - Echo (8/22): EF 10-15%, Repeat echo showed EF 10-15%, severe global hypokinesis with septal akinesis, LV severely dilated, grade II DD, RV fxn moderate to severely reduced, moderate to severe MR, moderate to severe AI.   - Echo (2/22): EF 20-25%, RV ok   - Echo (9/20): EF 20-25%   - Echo (2/20): EF 20-25%   - cMRI (06/27/18): EF 24%. Normal RV   - R/LHC (2/20) RA = 1 RV = 19/3 PA = 24/5 (14) PCW = 7 Fick cardiac output/index = 4.0/2.5 PVR = 1.9 WU Ao sat = 95% PA sat = 68%, 71%   Assessment: 1. Severe 1-vessel CAD with occluded LAD within previous stent 2. Normal RCA and LCX 3. LIMA to LAD widely patent 4. SVG - D2 widely patent 5. SVG - D1 likely occluded (grafts not marked)  ROS: All systems negative except as listed in  HPI, PMH and Problem List.  Past Medical History:  Diagnosis Date   CHF (congestive heart failure) (HCC)    Cirrhosis (HCC)    Coronary artery disease    Hypertension    Pancreatitis    Past Surgical History:  Procedure Laterality Date   BIV ICD INSERTION CRT-D N/A 05/28/2022   Procedure: BIV ICD INSERTION CRT-D;  Surgeon: Marinus Maw, MD;  Location: [facility] INVASIVE CV LAB;  Service: Cardiovascular;  Laterality: N/A;   CARDIAC SURGERY     RIGHT/LEFT HEART CATH AND CORONARY/GRAFT ANGIOGRAPHY N/A 06/24/2018   Procedure: RIGHT/LEFT HEART CATH AND CORONARY/GRAFT ANGIOGRAPHY;   Surgeon: Dolores Patty, MD;  Location: [facility] INVASIVE CV LAB;  Service: Cardiovascular;  Laterality: N/A;   Current Outpatient Medications  Medication Sig Dispense Refill   aspirin EC 81 MG tablet TAKE 1 TABLET (81 MG TOTAL) BY MOUTH DAILY. NEEDS FOLLOW UP APPOINTMENT FOR MORE REFILLS 30 tablet 0   atorvastatin (LIPITOR) 40 MG tablet TAKE 1 TABLET (40 MG TOTAL) BY MOUTH DAILY. NEEDS FOLLOW UP APPOINTMENT FOR ANYMORE REFILLS 30 tablet 0   carvedilol (COREG) 3.125 MG tablet TAKE 1 TABLET (3.125 MG TOTAL) BY MOUTH TWO (TWO) TIMES DAILY. NEEDS APPOINTMENT FOR MORE REFILLS 60 tablet 0   digoxin (LANOXIN) 0.125 MG tablet TAKE ONE TABLET BY MOUTH DAILY 30 tablet 3   ENTRESTO 24-26 MG TAKE ONE TABLET BY MOUTH TWICE A DAY 60 tablet 10   FARXIGA 10 MG TABS tablet TAKE 1 TABLET (10 MG TOTAL) BY MOUTH DAILY WITH BREAKFAST. NEEDS FOLLOW UP APPOINTMENT FOR ANYMORE REFILLS 30 tablet 0   FEROSUL 325 (65 Fe) MG tablet TAKE 1 TABLET (325 MG TOTAL) BY MOUTH TWO (TWO) TIMES DAILY. 30 tablet 2   furosemide (LASIX) 40 MG tablet Take 1 tablet (40 mg total) by mouth daily. 90 tablet 3   potassium chloride SA (KLOR-CON M) 20 MEQ tablet TAKE ONE TABLET BY MOUTH DAILY 30 tablet 0   spironolactone (ALDACTONE) 25 MG tablet TAKE 1 TABLET (25 MG TOTAL) BY MOUTH AT BEDTIME. NEEDS APPOINTMENT FOR MORE REFILLS 30 tablet 0   No current facility-administered medications for this encounter.   Allergies:   Patient has no known allergies.   Social History:  The patient  reports that he has quit smoking. His smoking use included cigarettes. He has never used smokeless tobacco. He reports current alcohol use. He reports current drug use. Drug: Marijuana.   Family History:  The patient's family history is not on file.   ROS:  Please see the history of present illness.   All other systems are personally reviewed and negative.   Recent Labs: 02/20/2022: B Natriuretic Peptide 1,979.3 05/25/2022: BUN 13; Creatinine, Ser 0.86;  Hemoglobin 12.8; Platelets 226; Potassium 4.6; Sodium 141  Personally reviewed   BP 138/70   Pulse 79   Wt 56.5 kg (124 lb 9.6 oz)   SpO2 97%   BMI 19.52 kg/m   Wt Readings from Last 3 Encounters:  12/18/22 56.5 kg (124 lb 9.6 oz)  12/11/22 58.8 kg (129 lb 9.6 oz)  11/28/22 58.5 kg (129 lb)   Physical Exam: General:  NAD. No resp difficulty, walked into clinic, thin HEENT: Normal Neck: Supple. JVP 8-9. Carotids 2+ bilat; no bruits. No lymphadenopathy or thryomegaly appreciated. Cor: PMI nondisplaced. Regular rate & rhythm. No rubs, gallops, 2/6 MR Lungs: Clear Abdomen: Soft, nontender, nondistended. No hepatosplenomegaly. No bruits or masses. Good bowel sounds. Extremities: No cyanosis, clubbing, rash, edema; soft mass to left  thigh, non-tender Neuro: Alert & oriented x 3, cranial nerves grossly intact. Moves all 4 extremities w/o difficulty. Affect pleasant.  Device interrogation (personally reviewed): HL Score 12, 1.9 hr/day activity, average HR 66 bpm, 100% BiV pacing  ECG (personally reviewed): a sensed, v paced 79 bpm. QRS 200 msec  ASSESSMENT AND PLAN: 1.  Chronic biventricular heart failure, NICM - Echo (2/20): EF 25-30%,   - cMRI (2/20) with EF 24% - Echo (9/20): EF 20-25% - Echo (2/22): EF 20-25% RV ok - Echo (8/22): EF 10-15%, moderately to severely reduced RV function, moderate to severe MR, moderate to severe AI. - Echo (12/22): EF 20-25%, global HK, grade II DD, RV moderately reduced, moderate MR - s/p BiV ICD 1/24 - Much improved with Paramedicine help. NYHA II. Volume OK, HL Score 12 - Continue Entresto 264/26 mg bid. Has not had AM meds yet, will not increase today. - Continue Lasix 40 mg daily and KCL 20 daily. - Continue digoxin 0.125 mg daily.  - Continue carvedilol 3.125 mg bid. - Continue Farxiga 10 mg daily. - Continue spiro 25 mg daily. - He is not a candidate for advanced therapies. - Doing well with paramedicine help.  - Repeat echo. - Labs  today.  2. CAD - s/p CABG 2017 - LHC 06/2018  with occluded LAD normal LCX and RCA. LIMA to LAD and SVG to D2 patent (SVG to D1 occluded) - No s/s angina - Continue aspirin 81 mg daily + atorvastatin.      3. LBBB - QRS 200 msec - now s/p CRT-D   4. Valvular heart disease - Moderate MR and mild AI on echo 12/22. LVIDd 7.1 cm - Will follow. - Repeat echo    5. h/o tobacco use - No longer smoking.  - Lives with people who do smoke.  6. ETOH use - Drinks 1-2 beers/day - Discussed cutting back/stopping.   7. HTN - Stable. - GDMT as above   8. Anemia - Iron deficient. On oral Fe. - Followed by PCP.  9. Illiterate/SDOH - Cannot read. Able to write numbers. Needs assistance with medications.    - Continue w/ paramedicine.  - Needs food, clothes and bed. Engage HFSW   10. Left knee mass - likely lipoma but in odd place - will get u/s  Follow up in 3-4 months with Dr. [name]. Update echo.  Cydney Ok, FNP  12/18/2022 8:56 AM  [facility] [address] [facility] [place] [address] [phone] (office) [phone] (fax)

## 2022-12-18 ENCOUNTER — Other Ambulatory Visit (HOSPITAL_COMMUNITY): Payer: Self-pay | Admitting: Emergency Medicine

## 2022-12-18 ENCOUNTER — Ambulatory Visit (HOSPITAL_COMMUNITY)
Admission: RE | Admit: 2022-12-18 | Discharge: 2022-12-18 | Disposition: A | Discharge status: Home / Self Care | Payer: 59 | Source: Ambulatory Visit | Attending: Family Medicine | Admitting: Family Medicine

## 2022-12-18 ENCOUNTER — Encounter (HOSPITAL_COMMUNITY): Payer: Self-pay

## 2022-12-18 VITALS — BP 138/70 | HR 79 | Wt 124.6 lb

## 2022-12-18 DIAGNOSIS — I428 Other cardiomyopathies: Secondary | ICD-10-CM | POA: Diagnosis not present

## 2022-12-18 DIAGNOSIS — Z550 Illiteracy and low-level literacy: Secondary | ICD-10-CM

## 2022-12-18 DIAGNOSIS — I2510 Atherosclerotic heart disease of native coronary artery without angina pectoris: Secondary | ICD-10-CM | POA: Insufficient documentation

## 2022-12-18 DIAGNOSIS — I10 Essential (primary) hypertension: Secondary | ICD-10-CM

## 2022-12-18 DIAGNOSIS — I447 Left bundle-branch block, unspecified: Secondary | ICD-10-CM | POA: Diagnosis not present

## 2022-12-18 DIAGNOSIS — I5022 Chronic systolic (congestive) heart failure: Secondary | ICD-10-CM | POA: Insufficient documentation

## 2022-12-18 DIAGNOSIS — D1724 Benign lipomatous neoplasm of skin and subcutaneous tissue of left leg: Secondary | ICD-10-CM | POA: Insufficient documentation

## 2022-12-18 DIAGNOSIS — Z7982 Long term (current) use of aspirin: Secondary | ICD-10-CM | POA: Diagnosis not present

## 2022-12-18 DIAGNOSIS — Z7984 Long term (current) use of oral hypoglycemic drugs: Secondary | ICD-10-CM | POA: Diagnosis not present

## 2022-12-18 DIAGNOSIS — I110 Hypertensive heart disease with heart failure: Secondary | ICD-10-CM | POA: Insufficient documentation

## 2022-12-18 DIAGNOSIS — D179 Benign lipomatous neoplasm, unspecified: Secondary | ICD-10-CM | POA: Insufficient documentation

## 2022-12-18 DIAGNOSIS — Z87891 Personal history of nicotine dependence: Secondary | ICD-10-CM | POA: Diagnosis not present

## 2022-12-18 DIAGNOSIS — I351 Nonrheumatic aortic (valve) insufficiency: Secondary | ICD-10-CM

## 2022-12-18 DIAGNOSIS — F1011 Alcohol abuse, in remission: Secondary | ICD-10-CM

## 2022-12-18 DIAGNOSIS — Z951 Presence of aortocoronary bypass graft: Secondary | ICD-10-CM | POA: Diagnosis not present

## 2022-12-18 DIAGNOSIS — Z79899 Other long term (current) drug therapy: Secondary | ICD-10-CM | POA: Insufficient documentation

## 2022-12-18 DIAGNOSIS — Z955 Presence of coronary angioplasty implant and graft: Secondary | ICD-10-CM | POA: Diagnosis not present

## 2022-12-18 DIAGNOSIS — I080 Rheumatic disorders of both mitral and aortic valves: Secondary | ICD-10-CM | POA: Insufficient documentation

## 2022-12-18 DIAGNOSIS — D509 Iron deficiency anemia, unspecified: Secondary | ICD-10-CM

## 2022-12-18 DIAGNOSIS — I340 Nonrheumatic mitral (valve) insufficiency: Secondary | ICD-10-CM

## 2022-12-18 DIAGNOSIS — Z95810 Presence of automatic (implantable) cardiac defibrillator: Secondary | ICD-10-CM | POA: Insufficient documentation

## 2022-12-18 LAB — BASIC METABOLIC PANEL
Anion gap: 13 (ref 5–15)
BUN: 19 mg/dL (ref 8–23)
CO2: 22 mmol/L (ref 22–32)
Calcium: 9.2 mg/dL (ref 8.9–10.3)
Chloride: 99 mmol/L (ref 98–111)
Creatinine, Ser: 1.24 mg/dL (ref 0.61–1.24)
GFR, Estimated: >60 mL/min (ref >60)
Glucose, Bld: 97 mg/dL (ref 70–99)
Potassium: 4 mmol/L (ref 3.5–5.1)
Sodium: 134 mmol/L — ABNORMAL LOW (ref 135–145)

## 2022-12-18 LAB — BRAIN NATRIURETIC PEPTIDE: B Natriuretic Peptide: 170.7 pg/mL — ABNORMAL HIGH (ref 0.0–100.0)

## 2022-12-18 LAB — DIGOXIN LEVEL: Digoxin Level: 0.8 ng/mL (ref 0.8–2.0)

## 2022-12-18 NOTE — Patient Instructions (Addendum)
Thank you for coming in today  If you had labs drawn today, any labs that are abnormal the clinic will call you No news is good news  Medications: no changes   Follow up appointments:  Your physician recommends that you schedule a follow-up appointment in:  3-4 months With Dr. [name] You will receive a reminder letter in the mail a few months in advance. If you don't receive a letter, please call our office to schedule the follow-up appointment.   You have been referred to have a leg ultrasound done, their office will call you for further appointment details    Your physician has requested that you have an echocardiogram. Echocardiography is a painless test that uses sound waves to create images of your heart. It provides your doctor with information about the size and shape of your heart and how well your heart's chambers and valves are working. This procedure takes approximately one hour. There are no restrictions for this procedure.      Do the following things EVERYDAY: Weigh yourself in the morning before breakfast. Write it down and keep it in a log. Take your medicines as prescribed Eat low salt foods--Limit salt (sodium) to 2000 mg per day.  Stay as active as you can everyday Limit all fluids for the day to less than 2 liters   At the [facility], you and your health needs are our priority. As part of our continuing mission to provide you with exceptional heart care, we have created designated Provider Care Teams. These Care Teams include your primary Cardiologist (physician) and Advanced Practice Providers (APPs- Physician Assistants and Nurse Practitioners) who all work together to provide you with the care you need, when you need it.   You may see any of the following providers on your designated Care Team at your next follow up: Dr Arvilla Meres Dr Marca Ancona Dr. Marcos Eke, NP Robbie Lis, [place] [facility] [place], [place] Brynda Peon, NP Karle Plumber, PharmD   Please be sure to bring in all your medications bottles to every appointment.    Thank you for choosing [facility]  If you have any questions or concerns before your next appointment please send [place] a message through [place] or call our office at [phone].    TO LEAVE A MESSAGE FOR THE NURSE SELECT OPTION 2, PLEASE LEAVE A MESSAGE INCLUDING: YOUR NAME DATE OF BIRTH CALL BACK NUMBER REASON FOR CALL**this is important as we prioritize the call backs  YOU WILL RECEIVE A CALL BACK THE SAME DAY AS LONG AS YOU CALL BEFORE 4:00 PM

## 2022-12-20 NOTE — Progress Notes (Signed)
Remote ICD transmission.   

## 2022-12-20 NOTE — Progress Notes (Signed)
Med rec only today as Jeremy Powers forgot to bring his pill box to the clinic visit yesterday.  Pill box reconciled x 2 weeks.    [name], EMT-Paramedic [phone] 12/20/2022

## 2022-12-26 ENCOUNTER — Other Ambulatory Visit (HOSPITAL_COMMUNITY): Payer: Self-pay | Admitting: Emergency Medicine

## 2022-12-26 ENCOUNTER — Telehealth (HOSPITAL_COMMUNITY): Payer: Self-pay | Admitting: Licensed Clinical Social Worker

## 2022-12-26 NOTE — Progress Notes (Signed)
Paramedicine Encounter    Patient ID: Jeremy Powers, male    DOB: 1956/04/20, 67 y.o.   MRN: [id number]   Complaints NONE  Assessment A&O x 4, Lung sounds clear bilat.  No edema noted.  Compliance with meds  YES  Pill box filled x 2 weeks  Refills needed Ferrous Sulfate  Meds changes since last visit NONE    Social changes NONE   BP 120/60 (BP Location: Left Arm, Patient Position: Sitting, Cuff Size: Normal)   Pulse 70   Resp 14   Wt 131 lb 12.8 oz (59.8 kg)   SpO2 96%   BMI 20.64 kg/m  Weight yesterday-not taken Last visit weight-129lb   Mr. Freimark had done well with med compliance.  He denies chest pain or SOB. Lung sound clear bilat and no peripheral edema noted.  Med box completed for 2 weeks.  I will be out of the office next week and he should have meds to last him.  Next home visit will be 8/21 @ 8:30. Mr. Bertelsen has an appointment for an ECHO tomorrow @ [facility]&V.  I contacted [name] to schedule transportation for tomorrow.  Blue Marcelino Scot will pick him up @ 7:00 a.m.  Pt is aware of same.  Ovidio Hanger Tasha's contact number - [phone] to give to [facility] for tomorrow.  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   Biventricular ICD (implantable cardioverter-defibrillator) in place 09/04/2022   NICM (nonischemic cardiomyopathy) (HCC) 03/20/2022   Malnutrition of moderate degree 09/23/2021   Dyslipidemia 09/21/2021   Anemia 09/21/2021   AKI (acute kidney injury) (HCC) 09/21/2021   Essential hypertension 09/20/2021   CHF (congestive heart failure) (HCC) 05/08/2021   Acute on chronic systolic CHF (congestive heart failure) (HCC) 05/06/2021   Mitral regurgitation 12/26/2020   COVID-19 virus infection 12/24/2020    CHF exacerbation (HCC) 12/23/2020   Acute on chronic systolic HF (heart failure) (HCC) 06/24/2018   Aortic insufficiency 06/22/2018   Acute on chronic congestive heart failure (HCC) 06/22/2018   Elevated troponin 06/22/2018   CAD (coronary artery disease) 06/22/2018   Left bundle branch block 06/22/2018   History of iron deficiency anemia 06/22/2018   Acute on chronic heart failure (HCC) 06/22/2018   Chronic bilateral low back pain without sciatica 12/12/2015   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   Iron deficiency anemia 03/28/2015   Hyperlipidemia 10/12/2014    Current Outpatient Medications:    aspirin EC 81 MG tablet, TAKE 1 TABLET (81 MG TOTAL) BY MOUTH DAILY. NEEDS FOLLOW UP APPOINTMENT FOR MORE REFILLS, Disp: 30 tablet, Rfl: 0   atorvastatin (LIPITOR) 40 MG tablet, TAKE 1 TABLET (40 MG TOTAL) BY MOUTH DAILY. NEEDS FOLLOW UP APPOINTMENT FOR ANYMORE REFILLS, Disp: 30 tablet, Rfl: 0   carvedilol (COREG) 3.125 MG tablet, TAKE 1 TABLET (3.125 MG TOTAL) BY MOUTH TWO (TWO) TIMES DAILY. NEEDS APPOINTMENT FOR MORE REFILLS, Disp: 60 tablet, Rfl: 0   digoxin (LANOXIN) 0.125 MG tablet, TAKE ONE TABLET BY MOUTH DAILY, Disp: 30 tablet, Rfl: 3   ENTRESTO 24-26 MG, TAKE ONE TABLET BY MOUTH TWICE A DAY, Disp: 60 tablet, Rfl: 10   FARXIGA 10 MG TABS tablet, TAKE 1 TABLET (10 MG TOTAL) BY MOUTH DAILY WITH BREAKFAST. NEEDS FOLLOW UP APPOINTMENT FOR  ANYMORE REFILLS, Disp: 30 tablet, Rfl: 0   FEROSUL 325 (65 Fe) MG tablet, TAKE 1 TABLET (325 MG TOTAL) BY MOUTH TWO (TWO) TIMES DAILY., Disp: 30 tablet, Rfl: 2   furosemide (LASIX) 40 MG tablet, Take 1 tablet (40 mg total) by mouth daily., Disp: 90 tablet, Rfl: 3   potassium chloride SA (KLOR-CON M) 20 MEQ tablet, TAKE ONE TABLET BY MOUTH DAILY, Disp: 30 tablet, Rfl: 0   spironolactone (ALDACTONE) 25 MG tablet, TAKE 1 TABLET (25 MG TOTAL) BY MOUTH AT BEDTIME. NEEDS APPOINTMENT FOR MORE REFILLS, Disp: 30 tablet, Rfl: 0 No Known  Allergies   Social History   Socioeconomic History   Marital status: Single    Spouse name: Not on file   Number of children: Not on file   Years of education: Not on file   Highest education level: Not on file  Occupational History   Not on file  Tobacco Use   Smoking status: Former    Current packs/day: 0.50    Types: Cigarettes   Smokeless tobacco: Never  Vaping Use   Vaping status: Never Used  Substance and Sexual Activity   Alcohol use: Yes    Comment: daily   Drug use: Yes    Types: Marijuana   Sexual activity: Not on file  Other Topics Concern   Not on file  Social History Narrative   Not on file   Social Determinants of Health   Financial Resource Strain: Not on file  Food Insecurity: Food Insecurity Present (10/17/2021)   Hunger Vital Sign    Worried About Running Out of Food in the Last Year: Often true    Ran Out of Food in the Last Year: Sometimes true  Transportation Needs: Unmet Transportation Needs (12/26/2022)   PRAPARE - Administrator, Civil Service (Medical): Yes    Lack of Transportation (Non-Medical): Yes  Physical Activity: Not on file  Stress: Not on file  Social Connections: Not on file  Intimate Partner Violence: Not on file    Physical Exam      Future Appointments  Date Time Provider Department Center  12/27/2022  8:00 AM [facility] ECHO OP 1 [facility]  12/27/2022  9:00 AM [facility] VASC US 1 - OUTPATIENT ONLY [facility]  01/14/2023  8:40 AM [name], PA-C [facility]  02/26/2023  7:00 AM [facility] DEVICE REMOTES [facility]  05/28/2023  7:00 AM [facility] DEVICE REMOTES [facility]  08/27/2023  7:00 AM [facility] DEVICE REMOTES [facility]  11/26/2023  7:00 AM [facility] DEVICE REMOTES [facility]  02/25/2024  7:00 AM [facility] DEVICE REMOTES [facility]  05/26/2024  7:00 AM [facility] DEVICE REMOTES [facility]   Paramedicine  Encounter   Patient ID: Jeremy Powers , male,   DOB: [birth year],66 y.o.,  MRN: [id number]

## 2022-12-26 NOTE — Telephone Encounter (Signed)
H&V Care Navigation CSW Progress Note  Clinical Social Worker  received call from Paramedic  to request assistance with transportation.  Patient is participating in a Managed Medicaid Plan:  No  CSW requested to assistance with transportation to clinic tomorrow. CSW arranged [employer] and waiver reviewed. [name], LCSW, CCSW-MCS [phone]   SDOH Screenings   Food Insecurity: Food Insecurity Present (10/17/2021)  Housing: Low Risk  (12/27/2020)  Transportation Needs: Unmet Transportation Needs (12/26/2022)  Depression (PHQ2-9): Low Risk  (06/07/2020)  Recent Concern: Depression (PHQ2-9) - Medium Risk (04/28/2020)  Tobacco Use: Medium Risk (12/18/2022)   12/26/2022  Jeremy Powers DOB: 02/18/1956 MRN: [id number]   RIDER WAIVER AND RELEASE OF LIABILITY  For the purposes of helping with transportation needs, [facility] partners with outside transportation providers (taxi companies, [place], Catering manager.) to give [employer] patients or other approved people the choice of on-demand rides [employer]") to our buildings for non-emergency visits.  By using [employer], I, the person signing this document, on behalf of myself and/or any legal minors (in my care using the [employer]), agree:  Science writer given to me are supplied by independent, outside transportation providers who do not work for, or have any affiliation with, [employer]. [place] is not a transportation company. [facility] has no control over the quality or safety of the rides I get using [employer]. [facility] has no control over whether any outside ride will happen on time or not. [place] gives no guarantee on the reliability, quality, safety, or availability on any rides, or that no mistakes will happen. I know and accept that traveling by vehicle (car, truck, SVU, Zenaida Niece, bus, taxi, etc.) has risks of serious injuries such as disability, being paralyzed, and death. I know and agree  the risk of using [employer] is mine alone, and not [employer]. Transport Services are provided "as is" and as are available. The transportation providers are in charge for all inspections and care of the vehicles used to provide these rides. I agree not to take legal action against [place], its agents, employees, officers, directors, representatives, insurers, attorneys, assigns, successors, subsidiaries, and affiliates at any time for any reasons related directly or indirectly to using [employer]. I also agree not to take legal action against [place] or its affiliates for any injury, death, or damage to property caused by or related to using [employer]. I have read this Waiver and Release of Liability, and I understand the terms used in it and their legal meaning. This Waiver is freely and voluntarily given with the understanding that my right (or any legal minors) to legal action against  relating to [employer] is knowingly given up to use these services.   I attest that I read the Ride Waiver and Release of Liability to Jeremy Powers, gave Mr. Leseberg the opportunity to ask questions and answered the questions asked (if any). I affirm that Jeremy Powers then provided consent for assistance with transportation.     Marcy Siren

## 2022-12-27 ENCOUNTER — Encounter (HOSPITAL_COMMUNITY): Payer: Self-pay

## 2022-12-27 ENCOUNTER — Ambulatory Visit (HOSPITAL_COMMUNITY)
Admission: RE | Admit: 2022-12-27 | Discharge: 2022-12-27 | Disposition: A | Discharge status: Home / Self Care | Payer: 59 | Source: Ambulatory Visit | Attending: Internal Medicine | Admitting: Internal Medicine

## 2022-12-27 ENCOUNTER — Ambulatory Visit (HOSPITAL_COMMUNITY)
Admission: RE | Admit: 2022-12-27 | Discharge: 2022-12-27 | Disposition: A | Discharge status: Home / Self Care | Payer: 59 | Source: Ambulatory Visit | Attending: Family Medicine | Admitting: Family Medicine

## 2022-12-27 DIAGNOSIS — E785 Hyperlipidemia, unspecified: Secondary | ICD-10-CM | POA: Diagnosis not present

## 2022-12-27 DIAGNOSIS — I5022 Chronic systolic (congestive) heart failure: Secondary | ICD-10-CM

## 2022-12-27 DIAGNOSIS — I080 Rheumatic disorders of both mitral and aortic valves: Secondary | ICD-10-CM | POA: Diagnosis not present

## 2022-12-27 DIAGNOSIS — I110 Hypertensive heart disease with heart failure: Secondary | ICD-10-CM | POA: Insufficient documentation

## 2022-12-27 DIAGNOSIS — I447 Left bundle-branch block, unspecified: Secondary | ICD-10-CM | POA: Insufficient documentation

## 2022-12-27 DIAGNOSIS — D1724 Benign lipomatous neoplasm of skin and subcutaneous tissue of left leg: Secondary | ICD-10-CM | POA: Diagnosis present

## 2022-12-27 DIAGNOSIS — Z87891 Personal history of nicotine dependence: Secondary | ICD-10-CM | POA: Insufficient documentation

## 2022-12-27 LAB — ECHOCARDIOGRAM COMPLETE
AR max vel: 2.31 cm2
AV Area VTI: 2.44 cm2
AV Area mean vel: 2.19 cm2
AV Mean grad: 7 mmHg
AV Peak grad: 14.1 mmHg
AV Vena cont: 0.47 cm
Ao pk vel: 1.88 m/s
Area-P 1/2: 2.69 cm2
Calc EF: 58.2 %
P 1/2 time: 592 msec
S' Lateral: 4.4 cm
Single Plane A2C EF: 66.4 %
Single Plane A4C EF: 48.3 %

## 2022-12-27 NOTE — Progress Notes (Signed)
*  PRELIMINARY RESULTS* Echocardiogram 2D Echocardiogram has been performed.  Laddie Aquas 12/27/2022, 8:37 AM

## 2023-01-04 ENCOUNTER — Encounter (HOSPITAL_COMMUNITY): Payer: Self-pay

## 2023-01-09 ENCOUNTER — Other Ambulatory Visit: Payer: Self-pay | Admitting: Cardiology

## 2023-01-09 ENCOUNTER — Other Ambulatory Visit (HOSPITAL_COMMUNITY): Payer: Self-pay | Admitting: Internal Medicine

## 2023-01-09 ENCOUNTER — Other Ambulatory Visit (HOSPITAL_COMMUNITY): Payer: Self-pay | Admitting: Emergency Medicine

## 2023-01-09 NOTE — Progress Notes (Signed)
Paramedicine Encounter    Patient ID: Jeremy Powers, male    DOB: 1955/12/23, 67 y.o.   MRN: [id number]   Complaints NONE  Assessment A&O x 4, skin W&D w/ good color.  Lung sounds clear throughout and no edema noted.  Compliance with meds missed 1 Potassium dose  Pill box filled 2 weeks  Refills needed Ferrous Sulfate & Carvedilol - called into pharmacy  Meds changes since last visit none    Social changes NONE   BP 130/60 (BP Location: Left Arm, Patient Position: Sitting, Cuff Size: Normal)   Pulse 68   Resp 14   Wt 126 lb 3.2 oz (57.2 kg)   SpO2 96%   BMI 19.77 kg/m  Weight yesterday- Not taken Last visit weight-131lb 12/26/22  ATF [name] in great spirits today.  He states he feels "really good."  He tells me he has been out taking walks and he thinks that makes him feel better.  He says that he does have to stop to catch his breath occasionally catch his breath but overall he feels he's doing well. Med box reconciled from bubble packs for 2 weeks.  Refills called into [facility].  Advised pharmacy that Mr. Plowden no longer wishes to use bubble packs and wants to go back to med bottles. Ferrous Sulfate pills to be delivered and placed 1 tablet in a.m. and p.m. slot.  Pt advises he understands same and shared this information with [name] and she states she will make sure it gets done when it is delivered.  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/2022   Biventricular ICD (implantable cardioverter-defibrillator) in place 09/04/2022   NICM (nonischemic cardiomyopathy) (HCC) 03/20/2022   Malnutrition of moderate degree 09/23/2021   Dyslipidemia 09/21/2021   Anemia 09/21/2021   AKI (acute kidney injury) (HCC) 09/21/2021    Essential hypertension 09/20/2021   CHF (congestive heart failure) (HCC) 05/08/2021   Acute on chronic systolic CHF (congestive heart failure) (HCC) 05/06/2021   Mitral regurgitation 12/26/2020   COVID-19 virus infection 12/24/2020   CHF exacerbation (HCC) 12/23/2020   Acute on chronic systolic HF (heart failure) (HCC) 06/24/2018   Aortic insufficiency 06/22/2018   Acute on chronic congestive heart failure (HCC) 06/22/2018   Elevated troponin 06/22/2018   CAD (coronary artery disease) 06/22/2018   Left bundle branch block 06/22/2018   History of iron deficiency anemia 06/22/2018   Acute on chronic heart failure (HCC) 06/22/2018   Chronic bilateral low back pain without sciatica 12/12/2015   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   Iron deficiency anemia 03/28/2015   Hyperlipidemia 10/12/2014    Current Outpatient Medications:    aspirin EC 81 MG tablet, TAKE 1 TABLET (81 MG TOTAL) BY MOUTH DAILY. NEEDS FOLLOW UP APPOINTMENT FOR MORE REFILLS, Disp: 30 tablet, Rfl: 0   atorvastatin (LIPITOR) 40 MG tablet, TAKE 1 TABLET (40 MG TOTAL) BY MOUTH DAILY. NEEDS FOLLOW UP APPOINTMENT FOR ANYMORE REFILLS, Disp: 30 tablet, Rfl: 0   carvedilol (COREG) 3.125 MG tablet, TAKE 1 TABLET (3.125 MG TOTAL) BY MOUTH TWO (TWO) TIMES DAILY. NEEDS APPOINTMENT FOR MORE REFILLS, Disp: 180 tablet, Rfl: 0   digoxin (LANOXIN) 0.125 MG tablet, TAKE ONE TABLET BY MOUTH DAILY, Disp: 30 tablet, Rfl: 3   ENTRESTO 24-26  MG, TAKE ONE TABLET BY MOUTH TWICE A DAY, Disp: 60 tablet, Rfl: 10   FARXIGA 10 MG TABS tablet, TAKE 1 TABLET (10 MG TOTAL) BY MOUTH DAILY WITH BREAKFAST. NEEDS FOLLOW UP APPOINTMENT FOR ANYMORE REFILLS, Disp: 30 tablet, Rfl: 0   FEROSUL 325 (65 Fe) MG tablet, TAKE 1 TABLET (325 MG TOTAL) BY MOUTH TWO (TWO) TIMES DAILY, Disp: 30 tablet, Rfl: 1   furosemide (LASIX) 40 MG tablet, Take 1 tablet (40 mg total) by mouth daily., Disp: 90 tablet, Rfl: 3   potassium chloride SA (KLOR-CON M) 20 MEQ tablet,  TAKE ONE TABLET BY MOUTH DAILY, Disp: 30 tablet, Rfl: 0   spironolactone (ALDACTONE) 25 MG tablet, TAKE 1 TABLET (25 MG TOTAL) BY MOUTH AT BEDTIME. NEEDS APPOINTMENT FOR MORE REFILLS, Disp: 30 tablet, Rfl: 0 No Known Allergies   Social History   Socioeconomic History   Marital status: Single    Spouse name: Not on file   Number of children: Not on file   Years of education: Not on file   Highest education level: Not on file  Occupational History   Not on file  Tobacco Use   Smoking status: Former    Current packs/day: 0.50    Types: Cigarettes   Smokeless tobacco: Never  Vaping Use   Vaping status: Never Used  Substance and Sexual Activity   Alcohol use: Yes    Comment: daily   Drug use: Yes    Types: Marijuana   Sexual activity: Not on file  Other Topics Concern   Not on file  Social History Narrative   Not on file   Social Determinants of Health   Financial Resource Strain: Not on file  Food Insecurity: Food Insecurity Present (10/17/2021)   Hunger Vital Sign    Worried About Running Out of Food in the Last Year: Often true    Ran Out of Food in the Last Year: Sometimes true  Transportation Needs: Unmet Transportation Needs (12/26/2022)   PRAPARE - Administrator, Civil Service (Medical): Yes    Lack of Transportation (Non-Medical): Yes  Physical Activity: Not on file  Stress: Not on file  Social Connections: Not on file  Intimate Partner Violence: Not on file    Physical Exam      Future Appointments  Date Time Provider Department Center  01/14/2023  8:40 AM [name] [facility]  02/26/2023  7:00 AM [facility]  05/28/2023  7:00 AM [facility]ICE [facility]OT[facility]  08/27/2023  7:00 AM [facility]ICE REMOT[facility]  11/26/2023  7:00 AM [facility]ICE [facility]  02/25/2024  7:00 AM [facility]   05/26/2024  7:00 AM [facility]ICE [facility]

## 2023-01-10 ENCOUNTER — Telehealth (HOSPITAL_COMMUNITY): Payer: Self-pay

## 2023-01-10 ENCOUNTER — Telehealth (HOSPITAL_COMMUNITY): Payer: Self-pay | Admitting: Emergency Medicine

## 2023-01-10 NOTE — Telephone Encounter (Addendum)
[name] asked if I could assist in getting Jeremy Powers transportation for his upcoming appointment on 8/26.  I contacted Medicaid and waited 25 mins on the phone for associate they report he needs an updated assessment. I updated his assessment and he is now successfully able to use them going forward. They would not be able to set up this ride at this time due to this and it's too soon for him to be scheduled.    I did contact [employer] and set up this ride for 8/26 for his appointment with his PCP.   His pickup from home is estimated at 7:38-7:48am   He will call them back for a pick up after his appointment. Their number is [phone]   Confirmation number is #[id number]    Maralyn Sago, EMT-Paramedic [phone] 01/10/2023

## 2023-01-10 NOTE — Telephone Encounter (Signed)
Stopped by residence to deliver transportation information for his appointment Monday 8/26 @ 8:40 w/ his PCP.  He will be picked up at home between 7:38- 7:48  Return trip will should call [phone]  Pt. Received same and advised he will be ready. Got a contact phone number for [name] Mining engineer)  [phone]

## 2023-01-14 ENCOUNTER — Encounter: Payer: Self-pay | Admitting: Medical

## 2023-01-14 ENCOUNTER — Ambulatory Visit (INDEPENDENT_AMBULATORY_CARE_PROVIDER_SITE_OTHER): Payer: 59 | Admitting: Medical

## 2023-01-14 ENCOUNTER — Ambulatory Visit (HOSPITAL_BASED_OUTPATIENT_CLINIC_OR_DEPARTMENT_OTHER)
Admission: RE | Admit: 2023-01-14 | Discharge: 2023-01-14 | Disposition: A | Discharge status: Home / Self Care | Payer: 59 | Source: Ambulatory Visit | Attending: Medical | Admitting: Medical

## 2023-01-14 VITALS — BP 137/65 | HR 58 | Temp 97.8°F | Resp 18 | Ht 68.0 in | Wt 126.0 lb

## 2023-01-14 DIAGNOSIS — I509 Heart failure, unspecified: Secondary | ICD-10-CM | POA: Diagnosis not present

## 2023-01-14 DIAGNOSIS — D649 Anemia, unspecified: Secondary | ICD-10-CM

## 2023-01-14 DIAGNOSIS — R739 Hyperglycemia, unspecified: Secondary | ICD-10-CM

## 2023-01-14 DIAGNOSIS — R2242 Localized swelling, mass and lump, left lower limb: Secondary | ICD-10-CM

## 2023-01-14 DIAGNOSIS — I10 Essential (primary) hypertension: Secondary | ICD-10-CM | POA: Diagnosis not present

## 2023-01-14 LAB — CBC WITH DIFFERENTIAL/PLATELET
Basophils Absolute: 0 10*3/uL (ref 0.0–0.1)
Basophils Relative: 0.8 % (ref 0.0–3.0)
Eosinophils Absolute: 0.1 10*3/uL (ref 0.0–0.7)
Eosinophils Relative: 2.6 % (ref 0.0–5.0)
HCT: 36.9 % — ABNORMAL LOW (ref 39.0–52.0)
Hemoglobin: 12 g/dL — ABNORMAL LOW (ref 13.0–17.0)
Lymphocytes Relative: 21.6 % (ref 12.0–46.0)
Lymphs Abs: 1.1 10*3/uL (ref 0.7–4.0)
MCHC: 32.6 g/dL (ref 30.0–36.0)
MCV: 97 fl (ref 78.0–100.0)
Monocytes Absolute: 0.8 10*3/uL (ref 0.1–1.0)
Monocytes Relative: 16 % — ABNORMAL HIGH (ref 3.0–12.0)
Neutro Abs: 2.9 10*3/uL (ref 1.4–7.7)
Neutrophils Relative %: 59 % (ref 43.0–77.0)
Platelets: 275 10*3/uL (ref 150.0–400.0)
RBC: 3.8 Mil/uL — ABNORMAL LOW (ref 4.22–5.81)
RDW: 14.1 % (ref 11.5–15.5)
WBC: 4.9 10*3/uL (ref 4.0–10.5)

## 2023-01-14 LAB — HEMOGLOBIN A1C: Hgb A1c MFr Bld: 5.4 % (ref 4.6–6.5)

## 2023-01-14 LAB — IRON: Iron: 48 ug/dL (ref 42–165)

## 2023-01-14 NOTE — Patient Instructions (Addendum)
1. Mass of left lower extremity -I think sport med will be able to evaluate area and tell if you have lipoma. - Ambulatory referral to Sports Medicine  2. Chronic congestive heart failure, valvular heart disease, lbbb   Continue Lasix 40 mg daily and KCL 20 daily. - Continue digoxin 0.125 mg daily.  - Continue carvedilol 3.125 mg bid. - Continue Farxiga 10 mg daily. - Continue spiro 25 mg daily. - get cxr today since not seen pt since jan 2023. -Recent cardiologist appoint note review with stable/good report.   3. Hypertension, unspecified type - stable on recheck.  4. Anemia, unspecified type  5. CAD No report of chest pain. - Continue aspirin 81 mg daily + atorvastatin.    6. Anemia - CBC w/Diff - Iron   7.Elevated sugar -A1c today   Follow up date to be determined after lab review.

## 2023-01-14 NOTE — Progress Notes (Signed)
Subjective:    Patient ID: Jeremy Powers, male    DOB: March 10, 1956, 67 y.o.   MRN: [id number]  HPI  Pt in for follow up.   It has been more than a year. Has been 1 about 1 year and 7 months since last follow up.   On review pt has come in periodically after hospitalization or after ED visits.   Last visit A/P January 2023.  "Follow-up from hospitalization due to decompensated heart failure.  Now patient appears clinically stable with his weight down and O2 sat at 99%.  No pedal edema.  Glad to know that home health RN is coming out to help with medication management.  Patient also expresses that he is doing well presently as long as he is taking the medication.  Continue medications on DC med list summary.   Will get CMP, CBC, BNP and chest x-ray.  I did go ahead and placed referral back to patient's cardiologist as that was listed on the discharge summary but not place.   Low magnesium in the hospital also as active labs today.   Hypertension well-controlled."   Pt feeling well with no chest pain or pedal edema.  Pt states he get very area occasional mild shortness of breath but recovers easily with rest. He states that has been the case for months and no significant change.   He states daily walks 2-3 blocks a day. Has been doing that since chf dx and states stable.  December 18, 2022 pt saw cardiologist for followup.  Assessment: 1. Severe 1-vessel CAD with occluded LAD within previous stent 2. Normal RCA and LCX 3. LIMA to LAD widely patent 4. SVG - D2 widely patent 5. SVG - D1 likely occluded (grafts not marked)   Device interrogation (personally reviewed): HL Score 12, 1.9 hr/day activity, average HR 66 bpm, 100% BiV pacing   ECG (personally reviewed): a sensed, v paced 79 bpm. QRS 200 msec   ASSESSMENT AND PLAN: 1.  Chronic biventricular heart failure, NICM - Echo (2/20): EF 25-30%,   - cMRI (2/20) with EF 24% - Echo (9/20): EF 20-25% - Echo (2/22): EF 20-25% RV  ok - Echo (8/22): EF 10-15%, moderately to severely reduced RV function, moderate to severe MR, moderate to severe AI. - Echo (12/22): EF 20-25%, global HK, grade II DD, RV moderately reduced, moderate MR - s/p BiV ICD 1/24 - Much improved with Paramedicine help. NYHA II. Volume OK, HL Score 12 - Continue Entresto 264/26 mg bid. Has not had AM meds yet, will not increase today. - Continue Lasix 40 mg daily and KCL 20 daily. - Continue digoxin 0.125 mg daily.  - Continue carvedilol 3.125 mg bid. - Continue Farxiga 10 mg daily. - Continue spiro 25 mg daily. - He is not a candidate for advanced therapies. - Doing well with paramedicine help.  - Repeat echo. - Labs today.   2. CAD - s/p CABG 2017 - LHC 06/2018  with occluded LAD normal LCX and RCA. LIMA to LAD and SVG to D2 patent (SVG to D1 occluded) - No s/s angina - Continue aspirin 81 mg daily + atorvastatin.      3. LBBB - QRS 200 msec - now s/p CRT-D   4. Valvular heart disease - Moderate MR and mild AI on echo 12/22. LVIDd 7.1 cm - Will follow. - Repeat echo    5. h/o tobacco use - No longer smoking.  - Lives with people who do smoke.  6. ETOH use - Drinks 1-2 beers/day - Discussed cutting back/stopping.   7. HTN - Stable. - GDMT as above   8. Anemia - Iron deficient. On oral Fe. - Followed by PCP.   9. Illiterate/SDOH - Cannot read. Able to write numbers. Needs assistance with medications.    - Continue w/ paramedicine.  - Needs food, clothes and bed. Engage HFSW    10. Left knee mass - likely lipoma but in odd place - will get u/s   Follow up in 3-4 months with Dr. [name]. Update echo."  Pt has left lateral thigh are lump/mass that he states has been present for year. He mentioned to cardiologist. Above mentioned would get [place] but I don't see results. It was order but looks like note done. Pt states area feel odd but no pain.   Review of Systems  Constitutional:  Negative for chills, fatigue and  fever.  Respiratory:  Negative for cough, chest tightness and wheezing.        No obvious sob today. Pt walked up 2 flights of steps as I came into work today. Pt did well and did not appear short of breath.  Cardiovascular:  Negative for chest pain and palpitations.  Gastrointestinal:  Negative for abdominal pain.  Genitourinary:  Negative for dysuria, flank pain and frequency.  Musculoskeletal:  Negative for back pain.  Skin:  Negative for rash.  Neurological:  Negative for facial asymmetry, speech difficulty, weakness and light-headedness.  Hematological:  Negative for adenopathy. Does not bruise/bleed easily.  Psychiatric/Behavioral:  Negative for behavioral problems, confusion and decreased concentration. The patient is not nervous/anxious.    Past Medical History:  Diagnosis Date   CHF (congestive heart failure) (HCC)    Cirrhosis (HCC)    Coronary artery disease    Hypertension    Pancreatitis      Social History   Socioeconomic History   Marital status: Single    Spouse name: Not on file   Number of children: Not on file   Years of education: Not on file   Highest education level: Not on file  Occupational History   Not on file  Tobacco Use   Smoking status: Former    Current packs/day: 0.50    Types: Cigarettes   Smokeless tobacco: Never  Vaping Use   Vaping status: Never Used  Substance and Sexual Activity   Alcohol use: Yes    Comment: daily   Drug use: Yes    Types: Marijuana   Sexual activity: Not on file  Other Topics Concern   Not on file  Social History Narrative   Not on file   Social Determinants of Health   Financial Resource Strain: Not on file  Food Insecurity: Food Insecurity Present (10/17/2021)   Hunger Vital Sign    Worried About Running Out of Food in the Last Year: Often true    Ran Out of Food in the Last Year: Sometimes true  Transportation Needs: Unmet Transportation Needs (12/26/2022)   PRAPARE - Scientist, research (physical sciences) (Medical): Yes    Lack of Transportation (Non-Medical): Yes  Physical Activity: Not on file  Stress: Not on file  Social Connections: Not on file  Intimate Partner Violence: Not on file    Past Surgical History:  Procedure Laterality Date   BIV ICD INSERTION CRT-D N/A 05/28/2022   Procedure: BIV ICD INSERTION CRT-D;  Surgeon: Marinus Maw, MD;  Location[facility] INVASIVE CV LAB;  Service: Cardiovascular;  Laterality: N/A;   CARDIAC SURGERY     RIGHT/LEFT HEART CATH AND CORONARY/GRAFT ANGIOGRAPHY N/A 06/24/2018   Procedure: RIGHT/LEFT HEART CATH AND CORONARY/GRAFT ANGIOGRAPHY;  Surgeon: Dolores Patty, MD;  Location: [facility] INVASIVE CV LAB;  Service: Cardiovascular;  Laterality: N/A;    Family History  Problem Relation Age of Onset   Iron deficiency Neg Hx     No Known Allergies  Current Outpatient Medications on File Prior to Visit  Medication Sig Dispense Refill   aspirin EC 81 MG tablet TAKE 1 TABLET (81 MG TOTAL) BY MOUTH DAILY. NEEDS FOLLOW UP APPOINTMENT FOR MORE REFILLS 30 tablet 0   atorvastatin (LIPITOR) 40 MG tablet TAKE 1 TABLET (40 MG TOTAL) BY MOUTH DAILY. NEEDS FOLLOW UP APPOINTMENT FOR ANYMORE REFILLS 30 tablet 0   carvedilol (COREG) 3.125 MG tablet TAKE 1 TABLET (3.125 MG TOTAL) BY MOUTH TWO (TWO) TIMES DAILY. NEEDS APPOINTMENT FOR MORE REFILLS 180 tablet 0   digoxin (LANOXIN) 0.125 MG tablet TAKE ONE TABLET BY MOUTH DAILY 30 tablet 3   ENTRESTO 24-26 MG TAKE ONE TABLET BY MOUTH TWICE A DAY 60 tablet 10   FARXIGA 10 MG TABS tablet TAKE 1 TABLET (10 MG TOTAL) BY MOUTH DAILY WITH BREAKFAST. NEEDS FOLLOW UP APPOINTMENT FOR ANYMORE REFILLS 30 tablet 0   FEROSUL 325 (65 Fe) MG tablet TAKE 1 TABLET (325 MG TOTAL) BY MOUTH TWO (TWO) TIMES DAILY 30 tablet 1   furosemide (LASIX) 40 MG tablet Take 1 tablet (40 mg total) by mouth daily. 90 tablet 3   potassium chloride SA (KLOR-CON M) 20 MEQ tablet TAKE ONE TABLET BY MOUTH DAILY 30 tablet 0   spironolactone  (ALDACTONE) 25 MG tablet TAKE 1 TABLET (25 MG TOTAL) BY MOUTH AT BEDTIME. NEEDS APPOINTMENT FOR MORE REFILLS 30 tablet 0   No current facility-administered medications on file prior to visit.    BP 137/65   Pulse (!) 58   Temp 97.8 F (36.6 C)   Resp 18   Ht 5\' 8"  (1.727 m)   Wt 126 lb (57.2 kg)   SpO2 98%   BMI 19.16 kg/m        Objective:   Physical Exam  General Mental Status- Alert. General Appearance- Not in acute distress.   Skin General: Color- Normal Color. Moisture- Normal Moisture.  Neck Carotid Arteries- Normal color. Moisture- Normal Moisture. No carotid bruits. No JVD.  Chest and Lung Exam Auscultation: Breath Sounds:-Normal.  Cardiovascular Auscultation:Rythm- Regular. Murmurs & Other Heart Sounds:Auscultation of the heart reveals- No Murmurs.  Abdomen Inspection:-Inspeection Normal. Palpation/Percussion:Note:No mass. Palpation and Percussion of the abdomen reveal- Non Tender, Non Distended + BS, no rebound or guarding.   Neurologic Cranial Nerve exam:- CN III-XII intact(No nystagmus), symmetric smile. Strength:- 5/5 equal and symmetric strength both upper and lower extremities.   Lower ext- thin calfs, no pedal edema, symmetric and negative homans signs.  Left lower ext- lateral distal thigh above knee appears possible lipoma about 4 cm wide.      Assessment & Plan:   Patient Instructions  1. Mass of left lower extremity  - Ambulatory referral to Sports Medicine  2. Chronic congestive heart failure, valvular heart disease, lbbb   Continue Lasix 40 mg daily and KCL 20 daily. - Continue digoxin 0.125 mg daily.  - Continue carvedilol 3.125 mg bid. - Continue Farxiga 10 mg daily. - Continue spiro 25 mg daily. - get cxr today since not seen pt since jan 2023. -Recent cardiologist appoint note review with stable/good  report.   3. Hypertension, unspecified type - stable on recheck.  4. Anemia, unspecified type  5. CAD No report of  chest pain. - Continue aspirin 81 mg daily + atorvastatin.    6. Anemia - CBC w/Diff - Iron   7.Elevated sugar -A1c today  Follow up date to be determined after lab review.     [name], PA-C

## 2023-01-16 ENCOUNTER — Other Ambulatory Visit (HOSPITAL_COMMUNITY): Payer: Self-pay | Admitting: Family Medicine

## 2023-01-16 ENCOUNTER — Other Ambulatory Visit (HOSPITAL_COMMUNITY): Payer: Self-pay | Admitting: Internal Medicine

## 2023-01-22 ENCOUNTER — Other Ambulatory Visit (HOSPITAL_COMMUNITY): Payer: Self-pay | Admitting: Emergency Medicine

## 2023-01-22 NOTE — Progress Notes (Unsigned)
Paramedicine Encounter    Patient ID: Jeremy Powers, male    DOB: Sep 21, 1955, 67 y.o.   MRN: [id number]   Complaints***  Assessment***  Compliance with meds***  Pill box filled***  Refills needed ferrous sulfate  Meds changes since last visit***    Social changes***   There were no vitals taken for this visit. Weight yesterday- not taken Last visit weight-126lb   ACTION: {Paramed [phone]}  [name], EMT-Paramedic [phone] 01/22/23  Patient Care Team: [name] as PCP - General (Internal Medicine) Bensimhon, Bevelyn Buckles, MD as PCP - Cardiology (Cardiology)  Patient Active Problem List   Diagnosis Date Noted  . 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 Biventricular ICD (implantable cardioverter-defibrillator) in place 09/04/2022  . NICM (nonischemic cardiomyopathy) (HCC) 03/20/2022  . Malnutrition of moderate degree 09/23/2021  . Dyslipidemia 09/21/2021  . Anemia 09/21/2021  . AKI (acute kidney injury) (HCC) 09/21/2021  . Essential hypertension 09/20/2021  . CHF (congestive heart failure) (HCC) 05/08/2021  . Acute on chronic systolic CHF (congestive heart failure) (HCC) 05/06/2021  . Mitral regurgitation 12/26/2020  . COVID-19 virus infection 12/24/2020  . CHF exacerbation (HCC) 12/23/2020  . Acute on chronic systolic HF (heart failure) (HCC) 06/24/2018  . Aortic insufficiency 06/22/2018  . Acute on chronic congestive heart failure (HCC) 06/22/2018  . Elevated troponin 06/22/2018  . CAD (coronary artery disease) 06/22/2018  . Left bundle branch block 06/22/2018  . History of iron deficiency anemia 06/22/2018  . Acute on chronic heart failure (HCC) 06/22/2018  . Chronic bilateral low back pain without sciatica 12/12/2015  . 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  . Iron deficiency anemia 03/28/2015  . Hyperlipidemia 10/12/2014    Current Outpatient Medications:  .  aspirin EC 81 MG tablet, TAKE 1 TABLET (81 MG TOTAL) BY MOUTH DAILY. NEEDS  FOLLOW UP APPOINTMENT FOR MORE REFILLS, Disp: 30 tablet, Rfl: 0 .  atorvastatin (LIPITOR) 40 MG tablet, TAKE 1 TABLET (40 MG TOTAL) BY MOUTH DAILY. NEEDS FOLLOW UP APPOINTMENT FOR ANYMORE REFILLS, Disp: 90 tablet, Rfl: 0 .  carvedilol (COREG) 3.125 MG tablet, TAKE 1 TABLET (3.125 MG TOTAL) BY MOUTH TWO (TWO) TIMES DAILY. NEEDS APPOINTMENT FOR MORE REFILLS, Disp: 180 tablet, Rfl: 0 .  dapagliflozin propanediol (FARXIGA) 10 MG TABS tablet, TAKE 1 TABLET (10 MG TOTAL) BY MOUTH DAILY WITH BREAKFAST. NEEDS FOLLOW UP APPOINTMENT FOR ANYMORE REFILLS, Disp: 90 tablet, Rfl: 0 .  digoxin (LANOXIN) 0.125 MG tablet, TAKE ONE TABLET BY MOUTH DAILY, Disp: 30 tablet, Rfl: 3 .  ENTRESTO 24-26 MG, TAKE ONE TABLET BY MOUTH TWICE A DAY, Disp: 60 tablet, Rfl: 10 .  FEROSUL 325 (65 Fe) MG tablet, TAKE 1 TABLET (325 MG TOTAL) BY MOUTH TWO (TWO) TIMES DAILY, Disp: 30 tablet, Rfl: 1 .  furosemide (LASIX) 40 MG tablet, Take 1 tablet (40 mg total) by mouth daily., Disp: 90 tablet, Rfl: 3 .  potassium chloride SA (KLOR-CON M) 20 MEQ tablet, TAKE ONE TABLET BY MOUTH DAILY, Disp: 90 tablet, Rfl: 0 .  spironolactone (ALDACTONE) 25 MG tablet, TAKE 1 TABLET (25 MG TOTAL) BY MOUTH AT BEDTIME. NEEDS APPOINTMENT FOR MORE REFILLS, Disp: 30 tablet, Rfl: 0 No Known Allergies   Social History   Socioeconomic History  . Marital status: Single    Spouse name: Not on file  . Number of children: Not on file  . Years of education: Not on file  . Highest education level: Not on file  Occupational History  . Not on file  Tobacco Use  .  Smoking status: Former    Current packs/day: 0.50    Types: Cigarettes  . Smokeless tobacco: Never  Vaping Use  . Vaping status: Never Used  Substance and Sexual Activity  . Alcohol use: Yes    Comment: daily  . Drug use: Yes    Types: Marijuana  . Sexual activity: Not on file  Other Topics Concern  . Not on file  Social History Narrative  . Not on file   Social Determinants of Health    Financial Resource Strain: Not on file  Food Insecurity: Food Insecurity Present (10/17/2021)   Hunger Vital Sign   . Worried About Programme researcher, broadcasting/film/video in the Last Year: Often true   . Ran Out of Food in the Last Year: Sometimes true  Transportation Needs: Unmet Transportation Needs (12/26/2022)   PRAPARE - Transportation   . Lack of Transportation (Medical): Yes   . Lack of Transportation (Non-Medical): Yes  Physical Activity: Not on file  Stress: Not on file  Social Connections: Not on file  Intimate Partner Violence: Not on file    Physical Exam      Future Appointments  Date Time Provider Department Center  02/26/2023  7:00 AM [facility] DEVICE REMOTES [facility]  05/28/2023  7:00 AM [facility] DEVICE REMOTES [facility]  08/27/2023  7:00 AM [facility] DEVICE REMOTES [facility]  11/26/2023  7:00 AM [facility]ICE REMOTES [facility]  02/25/2024  7:00 AM [facility] DEVICE REMOTES [facility]  05/26/2024  7:00 AM [facility] DEVICE REMOTES [facility]

## 2023-01-24 ENCOUNTER — Other Ambulatory Visit (HOSPITAL_COMMUNITY): Payer: Self-pay

## 2023-01-24 ENCOUNTER — Telehealth (HOSPITAL_COMMUNITY): Payer: Self-pay | Admitting: Emergency Medicine

## 2023-01-24 MED ORDER — SPIRONOLACTONE 25 MG PO TABS: 25.0000 mg | ORAL_TABLET | Freq: Every day | ORAL | 11 refills | Status: DC

## 2023-01-24 NOTE — Telephone Encounter (Signed)
Called Jeremy Powers to provide address for his appointment at [facility] w/ Dr. Reino Bellis [address], [address] [place].  [phone].  Advised him the office requested he bring his [facility] Dual[facility] Medicaid Card to the appt so they can place a copy in his chart. This information was also text to [name] phone.    [name], EMT-Paramedic [phone] 01/24/2023

## 2023-01-30 ENCOUNTER — Ambulatory Visit (INDEPENDENT_AMBULATORY_CARE_PROVIDER_SITE_OTHER): Payer: 59 | Admitting: Sports Medicine

## 2023-01-30 ENCOUNTER — Encounter: Payer: Self-pay | Admitting: Sports Medicine

## 2023-01-30 VITALS — BP 161/70 | Ht 68.0 in | Wt 129.0 lb

## 2023-01-30 DIAGNOSIS — R600 Localized edema: Secondary | ICD-10-CM | POA: Diagnosis not present

## 2023-01-30 DIAGNOSIS — M7989 Other specified soft tissue disorders: Secondary | ICD-10-CM

## 2023-01-30 NOTE — Progress Notes (Signed)
   Subjective:    Patient ID: Jeremy Powers, male    DOB: 03-08-1956, 67 y.o.   MRN: [id number]  HPI chief complaint: Left thigh swelling  Patient is a very pleasant 67 year old male that presents today with 1 year of left thigh swelling.  He initially noticed it after undergoing some cardiac surgery.  The mass has gotten smaller at times but always returns.  It is nonpainful.  He has no knee pain.  No trauma.  Past medical history reviewed Medications reviewed Allergies reviewed    Review of Systems As above    Objective:   Physical Exam  Well-developed, well-nourished.  No acute distress  Left thigh: There is a soft tissue mass along the distal lateral femur.  It is nontender to palpation.  Slightly fluctuant.  Left knee shows full range of motion.  No tenderness to palpation.  Good stability.  X-rays of the left knee show minimal degenerative changes but nothing acute  Bedside ultrasound today shows the mass to be solid and not cystic      Assessment & Plan:   Soft tissue mass of the left distal lateral thigh  MRI with and without contrast to identify the soft tissue mass.  We will have the patient follow-up with me in the office after that study to discuss the results and delineate further treatment.  This note was dictated using Dragon naturally speaking software and may contain errors in syntax, spelling, or content which have not been identified prior to signing this note.

## 2023-02-05 ENCOUNTER — Other Ambulatory Visit (HOSPITAL_COMMUNITY): Payer: Self-pay | Admitting: Emergency Medicine

## 2023-02-05 NOTE — Progress Notes (Signed)
Paramedicine Encounter    Patient ID: Jeremy Powers, male    DOB: 1955-06-30, 67 y.o.   MRN: [id number]   Complaints NONE  Assessment A&O x 4, skin W&D w/ good color.  Lung sounds clear and equal bilat and no peripheral edema noted.  Compliance with meds Missed 2 a.m. doses and 1 p.m. dose.  Pill box filled x 2 weeks  Refills needed Carvedilol  Meds changes since last visit NONE    Social changes NONE   BP (!) 142/70 (BP Location: Left Arm, Patient Position: Sitting, Cuff Size: Normal)   Pulse 68   Resp 14   Wt 134 lb 3.2 oz (60.9 kg)   SpO2 97%   BMI 20.41 kg/m  Weight yesterday-not taken Last visit weight-134.2lb  ATF [name] w/o complaint.  He missed 2 a.m. doses and 1 p.m. dose of his meds.  He apologized and advised  he would try and get back on track.  He states, " I'm still feeling pretty good though."   Med box reconciled x 2 weeks.  Refill for Carvedilol called in to [facility] and will be delivered. No pending appointments at this time.  Next home visit scheduled for 10/1 @ 9:30.   ACTION: Home visit completed  Jeremy Powers [phone] 02/05/23  Patient Care Team: Jeremy Powers as PCP - General (Internal Medicine) Jeremy Romney Bevelyn Buckles, MD as PCP - Cardiology (Cardiology)  Patient Active Problem List   Diagnosis Date Noted   Lipoma 12/18/2022   Biventricular ICD (implantable cardioverter-defibrillator) in place 09/04/2022   NICM (nonischemic cardiomyopathy) (HCC) 03/20/2022   Malnutrition of moderate degree 09/23/2021   Dyslipidemia 09/21/2021   Anemia 09/21/2021   AKI (acute kidney injury) (HCC) 09/21/2021   Essential hypertension 09/20/2021   CHF (congestive heart failure) (HCC) 05/08/2021   Acute on chronic systolic CHF (congestive heart failure) (HCC) 05/06/2021   Mitral regurgitation 12/26/2020   COVID-19 virus infection 12/24/2020   CHF exacerbation (HCC) 12/23/2020   Acute on chronic systolic HF (heart  failure) (HCC) 06/24/2018   Aortic insufficiency 06/22/2018   Acute on chronic congestive heart failure (HCC) 06/22/2018   Elevated troponin 06/22/2018   CAD (coronary artery disease) 06/22/2018   Left bundle branch block 06/22/2018   History of iron deficiency anemia 06/22/2018   Acute on chronic heart failure (HCC) 06/22/2018   Chronic bilateral low back pain without sciatica 12/12/2015   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   Iron deficiency anemia 03/28/2015   Hyperlipidemia 10/12/2014    Current Outpatient Medications:    aspirin EC 81 MG tablet, TAKE 1 TABLET (81 MG TOTAL) BY MOUTH DAILY. NEEDS FOLLOW UP APPOINTMENT FOR MORE REFILLS, Disp: 30 tablet, Rfl: 0   atorvastatin (LIPITOR) 40 MG tablet, TAKE 1 TABLET (40 MG TOTAL) BY MOUTH DAILY. NEEDS FOLLOW UP APPOINTMENT FOR ANYMORE REFILLS, Disp: 90 tablet, Rfl: 0   carvedilol (COREG) 3.125 MG tablet, TAKE 1 TABLET (3.125 MG TOTAL) BY MOUTH TWO (TWO) TIMES DAILY. NEEDS APPOINTMENT FOR MORE REFILLS, Disp: 180 tablet, Rfl: 0   dapagliflozin propanediol (FARXIGA) 10 MG TABS tablet, TAKE 1 TABLET (10 MG TOTAL) BY MOUTH DAILY WITH BREAKFAST. NEEDS FOLLOW UP APPOINTMENT FOR ANYMORE REFILLS, Disp: 90 tablet, Rfl: 0   digoxin (LANOXIN) 0.125 MG tablet, TAKE ONE TABLET BY MOUTH DAILY, Disp: 30 tablet, Rfl: 3   ENTRESTO 24-26 MG, TAKE ONE TABLET BY MOUTH TWICE A DAY, Disp: 60 tablet, Rfl: 10   FEROSUL 325 (65 Fe) MG tablet,  TAKE 1 TABLET (325 MG TOTAL) BY MOUTH TWO (TWO) TIMES DAILY, Disp: 30 tablet, Rfl: 1   furosemide (LASIX) 40 MG tablet, Take 1 tablet (40 mg total) by mouth daily., Disp: 90 tablet, Rfl: 3   potassium chloride SA (KLOR-CON M) 20 MEQ tablet, TAKE ONE TABLET BY MOUTH DAILY, Disp: 90 tablet, Rfl: 0   spironolactone (ALDACTONE) 25 MG tablet, Take 1 tablet (25 mg total) by mouth at bedtime., Disp: 30 tablet, Rfl: 11 No Known Allergies   Social History   Socioeconomic History   Marital status: Single    Spouse name:  Not on file   Number of children: Not on file   Years of education: Not on file   Highest education level: Not on file  Occupational History   Not on file  Tobacco Use   Smoking status: Former    Current packs/day: 0.50    Types: Cigarettes   Smokeless tobacco: Never  Vaping Use   Vaping status: Never Used  Substance and Sexual Activity   Alcohol use: Yes    Comment: daily   Drug use: Yes    Types: Marijuana   Sexual activity: Not on file  Other Topics Concern   Not on file  Social History Narrative   Not on file   Social Determinants of Health   Financial Resource Strain: Not on file  Food Insecurity: Food Insecurity Present (10/17/2021)   Hunger Vital Sign    Worried About Running Out of Food in the Last Year: Often true    Ran Out of Food in the Last Year: Sometimes true  Transportation Needs: Unmet Transportation Needs (12/26/2022)   PRAPARE - Administrator, Civil Service (Medical): Yes    Lack of Transportation (Non-Medical): Yes  Physical Activity: Not on file  Stress: Not on file  Social Connections: Not on file  Intimate Partner Violence: Not on file    Physical Exam      Future Appointments  Date Time Provider Department Center  02/26/2023  7:00 AM [facility]  05/28/2023  7:00 AM [facility]  08/27/2023  7:00 AM [facility]  11/26/2023  7:00 AM [facility]  02/25/2024  7:00 AM [facility]  05/26/2024  7:00 AM [facility]

## 2023-02-08 ENCOUNTER — Telehealth (HOSPITAL_COMMUNITY): Payer: Self-pay

## 2023-02-08 NOTE — Telephone Encounter (Signed)
Spoke to [place] at [facility] in the Scheduling Department and MRI ordered by Dr. Margaretha Sheffield at Sports Medicine. was able to get Mr. Ballen scheduled for Wednesday December 4th at 1:00 at [facility]- He is to arrive at 12:00 for the appointment. I will forward to [employer] Armed forces operational officer) for her to make him aware.   Maralyn Sago, EMT-Paramedic [phone] 02/08/2023

## 2023-02-19 ENCOUNTER — Other Ambulatory Visit (HOSPITAL_COMMUNITY): Payer: Self-pay | Admitting: Emergency Medicine

## 2023-02-19 NOTE — Progress Notes (Signed)
Paramedicine Encounter    Patient ID: Jeremy Powers, male    DOB: 1956/02/16, 67 y.o.   MRN: [id number]   Complaints NONE  Assessment A&O x 4, skin W&D w/ good color.  No chest pain or SOB.  Lung sounds clear and equal bilat.  No edema noted.    Compliance with meds Missed 2  p.m. doses of Carvedilol and 1 dose of potassium.  Pill box Reconciled x 2 weeks.  Refills needed Ferrous Sulfate, Furosemide  Meds changes since last visit NONE    Social changes NONE   BP (!) 164/70 (BP Location: Left Arm, Patient Position: Sitting, Cuff Size: Normal)   Pulse 78   Resp 14   Wt 136 lb (61.7 kg)   SpO2 96%   BMI 20.68 kg/m  Weight yesterday-not taken Last visit weight-134lb   Jeremy Powers has no complaints of chest pain or SOB.  Lung sounds clear throughout and no edema noted.  He has missed a few doses of his meds and I reviewed with him what he was missing an possibly why.  He missed a couple of Carvedilol p.m. doses and it is a very small white pill in his box.  I recommended he double check the box after he dumps the pills into his hand to make sure nothing is left behind and he stated he would so same in the future. Med box reconciled x 2 weeks today.  Refills called into [employer] and refills will be delivered tomorrow. Next home visit scheduled for 02/28/23 @ 8: 30.   ACTION: Home visit completed  Bethanie Dicker [phone] 02/19/23  Patient Care Team: [name] as PCP - General (Internal Medicine) [name] Bevelyn Buckles, MD as PCP - Cardiology (Cardiology)  Patient Active Problem List   Diagnosis Date Noted   Lipoma 12/18/2022   Biventricular ICD (implantable cardioverter-defibrillator) in place 09/04/2022   NICM (nonischemic cardiomyopathy) (HCC) 03/20/2022   Malnutrition of moderate degree 09/23/2021   Dyslipidemia 09/21/2021   Anemia 09/21/2021   AKI (acute kidney injury) (HCC) 09/21/2021   Essential hypertension 09/20/2021   CHF  (congestive heart failure) (HCC) 05/08/2021   Acute on chronic systolic CHF (congestive heart failure) (HCC) 05/06/2021   Mitral regurgitation 12/26/2020   COVID-19 virus infection 12/24/2020   CHF exacerbation (HCC) 12/23/2020   Acute on chronic systolic HF (heart failure) (HCC) 06/24/2018   Aortic insufficiency 06/22/2018   Acute on chronic congestive heart failure (HCC) 06/22/2018   Elevated troponin 06/22/2018   CAD (coronary artery disease) 06/22/2018   Left bundle branch block 06/22/2018   History of iron deficiency anemia 06/22/2018   Acute on chronic heart failure (HCC) 06/22/2018   Chronic bilateral low back pain without sciatica 12/12/2015   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   Iron deficiency anemia 03/28/2015   Hyperlipidemia 10/12/2014    Current Outpatient Medications:    aspirin EC 81 MG tablet, TAKE 1 TABLET (81 MG TOTAL) BY MOUTH DAILY. NEEDS FOLLOW UP APPOINTMENT FOR MORE REFILLS, Disp: 30 tablet, Rfl: 0   atorvastatin (LIPITOR) 40 MG tablet, TAKE 1 TABLET (40 MG TOTAL) BY MOUTH DAILY. NEEDS FOLLOW UP APPOINTMENT FOR ANYMORE REFILLS, Disp: 90 tablet, Rfl: 0   carvedilol (COREG) 3.125 MG tablet, TAKE 1 TABLET (3.125 MG TOTAL) BY MOUTH TWO (TWO) TIMES DAILY. NEEDS APPOINTMENT FOR MORE REFILLS, Disp: 180 tablet, Rfl: 0   dapagliflozin propanediol (FARXIGA) 10 MG TABS tablet, TAKE 1 TABLET (10 MG TOTAL) BY MOUTH DAILY WITH BREAKFAST.  NEEDS FOLLOW UP APPOINTMENT FOR ANYMORE REFILLS, Disp: 90 tablet, Rfl: 0   digoxin (LANOXIN) 0.125 MG tablet, TAKE ONE TABLET BY MOUTH DAILY, Disp: 30 tablet, Rfl: 3   ENTRESTO 24-26 MG, TAKE ONE TABLET BY MOUTH TWICE A DAY, Disp: 60 tablet, Rfl: 10   FEROSUL 325 (65 Fe) MG tablet, TAKE 1 TABLET (325 MG TOTAL) BY MOUTH TWO (TWO) TIMES DAILY, Disp: 30 tablet, Rfl: 1   furosemide (LASIX) 40 MG tablet, Take 1 tablet (40 mg total) by mouth daily., Disp: 90 tablet, Rfl: 3   potassium chloride SA (KLOR-CON M) 20 MEQ tablet, TAKE ONE  TABLET BY MOUTH DAILY, Disp: 90 tablet, Rfl: 0   spironolactone (ALDACTONE) 25 MG tablet, Take 1 tablet (25 mg total) by mouth at bedtime., Disp: 30 tablet, Rfl: 11 No Known Allergies   Social History   Socioeconomic History   Marital status: Single    Spouse name: Not on file   Number of children: Not on file   Years of education: Not on file   Highest education level: Not on file  Occupational History   Not on file  Tobacco Use   Smoking status: Former    Current packs/day: 0.50    Types: Cigarettes   Smokeless tobacco: Never  Vaping Use   Vaping status: Never Used  Substance and Sexual Activity   Alcohol use: Yes    Comment: daily   Drug use: Yes    Types: Marijuana   Sexual activity: Not on file  Other Topics Concern   Not on file  Social History Narrative   Not on file   Social Determinants of Health   Financial Resource Strain: Not on file  Food Insecurity: Food Insecurity Present (10/17/2021)   Hunger Vital Sign    Worried About Running Out of Food in the Last Year: Often true    Ran Out of Food in the Last Year: Sometimes true  Transportation Needs: Unmet Transportation Needs (12/26/2022)   PRAPARE - Administrator, Civil Service (Medical): Yes    Lack of Transportation (Non-Medical): Yes  Physical Activity: Not on file  Stress: Not on file  Social Connections: Not on file  Intimate Partner Violence: Not on file    Physical Exam      Future Appointments  Date Time Provider Department Center  02/26/2023  7:00 AM [facility]  04/24/2023  1:00 PM [facility]  05/28/2023  7:00 AM [facility]  08/27/2023  7:00 AM [facility]  11/26/2023  7:00 AM [facility]  02/25/2024  7:00 AM [facility]  05/26/2024  7:00 AM [facility]

## 2023-02-26 ENCOUNTER — Ambulatory Visit: Payer: 59

## 2023-02-26 VITALS — BP 140/60 | HR 70 | Resp 16 | Wt 130.4 lb

## 2023-02-28 ENCOUNTER — Other Ambulatory Visit (HOSPITAL_COMMUNITY): Payer: Self-pay | Admitting: Emergency Medicine

## 2023-02-28 NOTE — Progress Notes (Signed)
Paramedicine Encounter    Patient ID: Jeremy Powers, male    DOB: [birth year], 67 y.o.   MRN: [id number]   Complaints No physical complaints  Assessment A&O x 4, skin W&D w/ good color.  No chest pain or SOB.  Lung sound clear throughout and no edema noted.  Compliance with meds Missed 4 consecutive evening doses.  Pill box filled x 2 weeks  Refills needed NONE  Meds changes since last visit NONE    Social changes NONE   BP 120/60 (BP Location: Left Arm, Patient Position: Sitting, Cuff Size: Normal)   Pulse 64   Resp 14   Wt 131 lb (59.4 kg)   SpO2 97%   BMI 19.92 kg/m  Weight yesterday- not taken Last visit weight-136lb  Today's visit finds [name] A&O x 4.  He begins our visit by saying, "I know you're gonna be mad but I got off track with my meds again."  I advised I wasn't mad but wanted to discuss what might be contributing to his non-compliance.  He proceeded to tell me that he is dealing with issues with his daughter who lives in [place].  He became tearful during the conversation.  I asked if there was anything I could do to help him with the situation or if he had any needs and he said there was really nothing that anyone could do. Med box reconciled x 2 weeks. He denies chest pain or SOB.  Lung sounds clear throughout and no peripheral edema noted. Next appointment scheduled for 03/13/23 @ 8:30  ACTION: Home visit completed  Bethanie Dicker [phone] 02/28/23  Patient Care Team: [name] as PCP - General (Internal Medicine) [name] Bevelyn Buckles, MD as PCP - Cardiology (Cardiology)  Patient Active Problem List   Diagnosis Date Noted   Lipoma 12/18/2022   Biventricular ICD (implantable cardioverter-defibrillator) in place 09/04/2022   NICM (nonischemic cardiomyopathy) (HCC) 03/20/2022   Malnutrition of moderate degree 09/23/2021   Dyslipidemia 09/21/2021   Anemia 09/21/2021   AKI (acute kidney injury) (HCC) 09/21/2021    Essential hypertension 09/20/2021   CHF (congestive heart failure) (HCC) 05/08/2021   Acute on chronic systolic CHF (congestive heart failure) (HCC) 05/06/2021   Mitral regurgitation 12/26/2020   COVID-19 virus infection 12/24/2020   CHF exacerbation (HCC) 12/23/2020   Acute on chronic systolic HF (heart failure) (HCC) 06/24/2018   Aortic insufficiency 06/22/2018   Acute on chronic congestive heart failure (HCC) 06/22/2018   Elevated troponin 06/22/2018   CAD (coronary artery disease) 06/22/2018   Left bundle branch block 06/22/2018   History of iron deficiency anemia 06/22/2018   Acute on chronic heart failure (HCC) 06/22/2018   Chronic bilateral low back pain without sciatica 12/12/2015   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   Iron deficiency anemia 03/28/2015   Hyperlipidemia 10/12/2014    Current Outpatient Medications:    aspirin EC 81 MG tablet, TAKE 1 TABLET (81 MG TOTAL) BY MOUTH DAILY. NEEDS FOLLOW UP APPOINTMENT FOR MORE REFILLS, Disp: 30 tablet, Rfl: 0   atorvastatin (LIPITOR) 40 MG tablet, TAKE 1 TABLET (40 MG TOTAL) BY MOUTH DAILY. NEEDS FOLLOW UP APPOINTMENT FOR ANYMORE REFILLS, Disp: 90 tablet, Rfl: 0   carvedilol (COREG) 3.125 MG tablet, TAKE 1 TABLET (3.125 MG TOTAL) BY MOUTH TWO (TWO) TIMES DAILY. NEEDS APPOINTMENT FOR MORE REFILLS, Disp: 180 tablet, Rfl: 0   dapagliflozin propanediol (FARXIGA) 10 MG TABS tablet, TAKE 1 TABLET (10 MG TOTAL) BY MOUTH DAILY WITH BREAKFAST. NEEDS  FOLLOW UP APPOINTMENT FOR ANYMORE REFILLS, Disp: 90 tablet, Rfl: 0   digoxin (LANOXIN) 0.125 MG tablet, TAKE ONE TABLET BY MOUTH DAILY, Disp: 30 tablet, Rfl: 3   ENTRESTO 24-26 MG, TAKE ONE TABLET BY MOUTH TWICE A DAY, Disp: 60 tablet, Rfl: 10   FEROSUL 325 (65 Fe) MG tablet, TAKE 1 TABLET (325 MG TOTAL) BY MOUTH TWO (TWO) TIMES DAILY, Disp: 30 tablet, Rfl: 1   furosemide (LASIX) 40 MG tablet, Take 1 tablet (40 mg total) by mouth daily., Disp: 90 tablet, Rfl: 3   potassium chloride SA  (KLOR-CON M) 20 MEQ tablet, TAKE ONE TABLET BY MOUTH DAILY, Disp: 90 tablet, Rfl: 0   spironolactone (ALDACTONE) 25 MG tablet, Take 1 tablet (25 mg total) by mouth at bedtime., Disp: 30 tablet, Rfl: 11 No Known Allergies   Social History   Socioeconomic History   Marital status: Single    Spouse name: Not on file   Number of children: Not on file   Years of education: Not on file   Highest education level: Not on file  Occupational History   Not on file  Tobacco Use   Smoking status: Former    Current packs/day: 0.50    Types: Cigarettes   Smokeless tobacco: Never  Vaping Use   Vaping status: Never Used  Substance and Sexual Activity   Alcohol use: Yes    Comment: daily   Drug use: Yes    Types: Marijuana   Sexual activity: Not on file  Other Topics Concern   Not on file  Social History Narrative   Not on file   Social Determinants of Health   Financial Resource Strain: Not on file  Food Insecurity: Food Insecurity Present (10/17/2021)   Hunger Vital Sign    Worried About Running Out of Food in the Last Year: Often true    Ran Out of Food in the Last Year: Sometimes true  Transportation Needs: Unmet Transportation Needs (12/26/2022)   PRAPARE - Administrator, Civil Service (Medical): Yes    Lack of Transportation (Non-Medical): Yes  Physical Activity: Not on file  Stress: Not on file  Social Connections: Not on file  Intimate Partner Violence: Not on file    Physical Exam      Future Appointments  Date Time Provider Department Center  04/24/2023  1:00 PM [facility] 1 [facility]  05/28/2023  7:00 AM [facility]  08/27/2023  7:00 AM [facility]ICE REMOT[facility]  11/26/2023  7:00 AM [facility]  02/25/2024  7:00 AM [facility]  05/26/2024  7:00 AM [facility]

## 2023-03-13 NOTE — Progress Notes (Unsigned)
Paramedicine Encounter    Patient ID: Jeremy Powers, male    DOB: 1955/08/22, 67 y.o.   MRN: [id number]   Complaints None  Assessment A&O x 4, skin W&D w/ good color.  No chest pain or SOB.   Lung sounds clear and equal bilat. No edema noted.  Compliance with meds Missed 2 evening doses  Pill box filled x 1 week  Refills needed Ferrous Sulfate  Meds changes since last visit NONE    Social changes134lb   BP (!) 140/60 (BP Location: Left Arm, Patient Position: Sitting, Cuff Size: Normal)   Pulse 70   Resp 16   Wt 130 lb 6.4 oz (59.1 kg)   SpO2 97%   BMI 19.83 kg/m  Weight yesterday-not taken Last visit weight-   ACTION: Home visit completed  [name], EMT-Paramedic [phone]

## 2023-03-20 ENCOUNTER — Other Ambulatory Visit (HOSPITAL_COMMUNITY): Payer: Self-pay | Admitting: Emergency Medicine

## 2023-03-20 ENCOUNTER — Ambulatory Visit: Payer: 59 | Attending: Internal Medicine

## 2023-03-20 DIAGNOSIS — I447 Left bundle-branch block, unspecified: Secondary | ICD-10-CM

## 2023-03-20 LAB — CUP PACEART REMOTE DEVICE CHECK
Battery Remaining Longevity: 132 mo
Battery Remaining Percentage: 100 %
Brady Statistic RA Percent Paced: 2 %
Brady Statistic RV Percent Paced: 31 %
Date Time Interrogation Session: 20241030102700
HighPow Impedance: 68 Ohm
Implantable Lead Connection Status: 753985
Implantable Lead Connection Status: 753985
Implantable Lead Connection Status: 753985
Implantable Lead Implant Date: 20240108
Implantable Lead Implant Date: 20240108
Implantable Lead Implant Date: 20240108
Implantable Lead Location: 753858
Implantable Lead Location: 753859
Implantable Lead Location: 753860
Implantable Lead Model: 137
Implantable Lead Model: 4671
Implantable Lead Model: 7841
Implantable Lead Serial Number: 1396493
Implantable Lead Serial Number: 301415
Implantable Lead Serial Number: 881901
Implantable Pulse Generator Implant Date: 20240108
Lead Channel Impedance Value: 496 Ohm
Lead Channel Impedance Value: 517 Ohm
Lead Channel Impedance Value: 602 Ohm
Lead Channel Pacing Threshold Amplitude: 0.4 V
Lead Channel Pacing Threshold Amplitude: 0.9 V
Lead Channel Pacing Threshold Amplitude: 1.1 V
Lead Channel Pacing Threshold Pulse Width: 0.4 ms
Lead Channel Pacing Threshold Pulse Width: 0.4 ms
Lead Channel Pacing Threshold Pulse Width: 0.4 ms
Lead Channel Setting Pacing Amplitude: 2.5 V
Lead Channel Setting Pacing Amplitude: 2.5 V
Lead Channel Setting Pacing Amplitude: 2.5 V
Lead Channel Setting Pacing Pulse Width: 0.4 ms
Lead Channel Setting Pacing Pulse Width: 0.4 ms
Lead Channel Setting Sensing Sensitivity: 0.5 mV
Lead Channel Setting Sensing Sensitivity: 1 mV
Pulse Gen Serial Number: 394480

## 2023-03-20 NOTE — Progress Notes (Signed)
Paramedicine Encounter    Patient ID: Jeremy Powers, male    DOB: [birth year], 67 y.o.   MRN: [id number]   Complaints NONE  Assessment A&O x 4, skin W&D w/ good color.  Lung sounds w/ some minor expiratory wheezing in upper lobes.  No chest pain and no peripheral edema noted.  Compliance with meds YES  Pill box filled x 1 week  Refills needed Ferrous Sulfate  Meds changes since last visit NONE    Social changes NONE   BP 138/60 (BP Location: Left Arm, Patient Position: Sitting, Cuff Size: Normal)   Pulse 96   Resp 14   Wt 125 lb 12.8 oz (57.1 kg)   SpO2 96%   BMI 19.13 kg/m  Weight yesterday- not taken Last visit weight-131lb  Today finds Jeremy Powers just getting back in from his morning walk.  He says he tries to walk daily and enjoys being outside.  He has been compliant with all meds and med box reconciled x 1 week.  Refill called in for Ferrous Sulfate and will be delivered when received. During visit, I noticed his [employer] device was flashing orange.  I call [phone] and was able to get help resetting the machine. Next home visit 11/6 @ 9:30.  ACTION: Home visit completed  Bethanie Dicker [phone] 03/20/23  Patient Care Team: [name] as PCP - General (Internal Medicine) [name] Bevelyn Buckles, MD as PCP - Cardiology (Cardiology)  Patient Active Problem List   Diagnosis Date Noted   Lipoma 12/18/2022   Biventricular ICD (implantable cardioverter-defibrillator) in place 09/04/2022   NICM (nonischemic cardiomyopathy) (HCC) 03/20/2022   Malnutrition of moderate degree 09/23/2021   Dyslipidemia 09/21/2021   Anemia 09/21/2021   AKI (acute kidney injury) (HCC) 09/21/2021   Essential hypertension 09/20/2021   CHF (congestive heart failure) (HCC) 05/08/2021   Acute on chronic systolic CHF (congestive heart failure) (HCC) 05/06/2021   Mitral regurgitation 12/26/2020   COVID-19 virus infection 12/24/2020   CHF exacerbation  (HCC) 12/23/2020   Acute on chronic systolic HF (heart failure) (HCC) 06/24/2018   Aortic insufficiency 06/22/2018   Acute on chronic congestive heart failure (HCC) 06/22/2018   Elevated troponin 06/22/2018   CAD (coronary artery disease) 06/22/2018   Left bundle branch block 06/22/2018   History of iron deficiency anemia 06/22/2018   Acute on chronic heart failure (HCC) 06/22/2018   Chronic bilateral low back pain without sciatica 12/12/2015   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   Iron deficiency anemia 03/28/2015   Hyperlipidemia 10/12/2014    Current Outpatient Medications:    aspirin EC 81 MG tablet, TAKE 1 TABLET (81 MG TOTAL) BY MOUTH DAILY. NEEDS FOLLOW UP APPOINTMENT FOR MORE REFILLS, Disp: 30 tablet, Rfl: 0   atorvastatin (LIPITOR) 40 MG tablet, TAKE 1 TABLET (40 MG TOTAL) BY MOUTH DAILY. NEEDS FOLLOW UP APPOINTMENT FOR ANYMORE REFILLS, Disp: 90 tablet, Rfl: 0   carvedilol (COREG) 3.125 MG tablet, TAKE 1 TABLET (3.125 MG TOTAL) BY MOUTH TWO (TWO) TIMES DAILY. NEEDS APPOINTMENT FOR MORE REFILLS, Disp: 180 tablet, Rfl: 0   dapagliflozin propanediol (FARXIGA) 10 MG TABS tablet, TAKE 1 TABLET (10 MG TOTAL) BY MOUTH DAILY WITH BREAKFAST. NEEDS FOLLOW UP APPOINTMENT FOR ANYMORE REFILLS, Disp: 90 tablet, Rfl: 0   digoxin (LANOXIN) 0.125 MG tablet, TAKE ONE TABLET BY MOUTH DAILY, Disp: 30 tablet, Rfl: 3   ENTRESTO 24-26 MG, TAKE ONE TABLET BY MOUTH TWICE A DAY, Disp: 60 tablet, Rfl: 10   FEROSUL 325 (  65 Fe) MG tablet, TAKE 1 TABLET (325 MG TOTAL) BY MOUTH TWO (TWO) TIMES DAILY, Disp: 30 tablet, Rfl: 1   furosemide (LASIX) 40 MG tablet, Take 1 tablet (40 mg total) by mouth daily., Disp: 90 tablet, Rfl: 3   potassium chloride SA (KLOR-CON M) 20 MEQ tablet, TAKE ONE TABLET BY MOUTH DAILY, Disp: 90 tablet, Rfl: 0   spironolactone (ALDACTONE) 25 MG tablet, Take 1 tablet (25 mg total) by mouth at bedtime., Disp: 30 tablet, Rfl: 11 No Known Allergies   Social History    Socioeconomic History   Marital status: Single    Spouse name: Not on file   Number of children: Not on file   Years of education: Not on file   Highest education level: Not on file  Occupational History   Not on file  Tobacco Use   Smoking status: Former    Current packs/day: 0.50    Types: Cigarettes   Smokeless tobacco: Never  Vaping Use   Vaping status: Never Used  Substance and Sexual Activity   Alcohol use: Yes    Comment: daily   Drug use: Yes    Types: Marijuana   Sexual activity: Not on file  Other Topics Concern   Not on file  Social History Narrative   Not on file   Social Determinants of Health   Financial Resource Strain: Not on file  Food Insecurity: Food Insecurity Present (10/17/2021)   Hunger Vital Sign    Worried About Running Out of Food in the Last Year: Often true    Ran Out of Food in the Last Year: Sometimes true  Transportation Needs: Unmet Transportation Needs (12/26/2022)   PRAPARE - Administrator, Civil Service (Medical): Yes    Lack of Transportation (Non-Medical): Yes  Physical Activity: Not on file  Stress: Not on file  Social Connections: Not on file  Intimate Partner Violence: Not on file    Physical Exam      Future Appointments  Date Time Provider Department Center  04/24/2023  1:00 PM [facility] 1 [facility]  06/19/2023  1:15 PM [facility] DEVICE REMOTES [facility]  09/18/2023  1:15 PM [facility] DEVICE REMOTES [facility]  12/18/2023  1:15 PM [facility] DEVICE REMOTES [facility]  03/18/2024  1:15 PM [facility] DEVICE REMOTES [facility]

## 2023-03-27 ENCOUNTER — Other Ambulatory Visit (HOSPITAL_COMMUNITY): Payer: Self-pay | Admitting: Emergency Medicine

## 2023-03-27 NOTE — Progress Notes (Signed)
Jeremy Powers was not home for visit this morning.   Med reconciliation only x 1 week.  Next home visit will be 04/03/23 @ 8:30 and family is aware of same.    [name], EMT-Paramedic [phone] 03/27/2023

## 2023-04-08 NOTE — Progress Notes (Signed)
Remote ICD transmission.   

## 2023-04-09 ENCOUNTER — Telehealth: Payer: Self-pay

## 2023-04-09 ENCOUNTER — Other Ambulatory Visit (HOSPITAL_COMMUNITY): Payer: Self-pay | Admitting: Emergency Medicine

## 2023-04-09 NOTE — Telephone Encounter (Signed)
-----   Message from [facility] sent at 04/09/2023  9:33 AM EST ----- Regarding: FW: pt. questions On review it appears more than a year since I have seen pt. So first thing to do would be to come in for office visit. ----- Message ----- From: [name], Paramedic Sent: 04/09/2023   9:08 AM EST To: [name], PA-C Subject: pt. questions                                  Jeremy Powers girl friend wanted to know if Jeremy Powers needs a bone density test? And also, can he be evaluated for Alzheimers?  She states he has been having some memory issues.  I am his home health paramedic and I see him through the paramedicine program through [facility]- Dr. [name]. I see him every other week for home visits and help him with is medicines and appointments.  Thank so much,    [name], EMT-Paramedic [phone] 04/09/2023

## 2023-04-09 NOTE — Progress Notes (Signed)
Paramedicine Encounter    Patient ID: Jeremy Powers, male    DOB: 1956/01/28, 67 y.o.   MRN: [id number]   Complaints NONE  Assessment A&O x 4, skin W&D w/ good color.  Denies chest pain or SOB.  Lung sounds clear throughout and no peripheral edema noted.    Compliance with meds missed 1 potassium from last regimen  Pill box filled x 2 weeks  Refills needed entresto, digoxin- called in and will be delivered tomorrow.  Meds changes since last visit NONE    Social changes NONE   BP (!) 140/60 (BP Location: Left Arm, Patient Position: Sitting, Cuff Size: Normal)   Pulse 60   Resp 14   Wt 131 lb (59.4 kg)   SpO2 98%   BMI 19.92 kg/m  Weight yesterday-not taken Last visit weight-125lb  ATF Mr. Wickes A&O x 4, skin W&D w/ good color.  He denies chest pain or SOB.  Lung sounds clear and equal bilat.  No edema noted. He has done well with his m:ed compliance.   He tells me he continues to go on daily walks and if he gets SOB he will stop and rest but for the most part he says he doesn't have real difficulty. His girlfriend [name] had question about Mr Pavelko being evaluated for bone density and also interested in him getting evaluated for Alzheimers.  She tells me that there are times when he asks the same questions over and over.  I will reach out to his PCP to follow up on these questions. Next home visit 8/27 @ 8:15.  ACTION: Home visit completed  Bethanie Dicker [phone] 04/10/23  Patient Care Team: [name] as PCP - General (Internal Medicine) [name] Bevelyn Buckles, MD as PCP - Cardiology (Cardiology)  Patient Active Problem List   Diagnosis Date Noted   Lipoma 12/18/2022   Biventricular ICD (implantable cardioverter-defibrillator) in place 09/04/2022   NICM (nonischemic cardiomyopathy) (HCC) 03/20/2022   Malnutrition of moderate degree 09/23/2021   Dyslipidemia 09/21/2021   Anemia 09/21/2021   AKI (acute kidney injury) (HCC) 09/21/2021    Essential hypertension 09/20/2021   CHF (congestive heart failure) (HCC) 05/08/2021   Acute on chronic systolic CHF (congestive heart failure) (HCC) 05/06/2021   Mitral regurgitation 12/26/2020   COVID-19 virus infection 12/24/2020   CHF exacerbation (HCC) 12/23/2020   Acute on chronic systolic HF (heart failure) (HCC) 06/24/2018   Aortic insufficiency 06/22/2018   Acute on chronic congestive heart failure (HCC) 06/22/2018   Elevated troponin 06/22/2018   CAD (coronary artery disease) 06/22/2018   Left bundle branch block 06/22/2018   History of iron deficiency anemia 06/22/2018   Acute on chronic heart failure (HCC) 06/22/2018   Chronic bilateral low back pain without sciatica 12/12/2015   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   Iron deficiency anemia 03/28/2015   Hyperlipidemia 10/12/2014    Current Outpatient Medications:    aspirin EC 81 MG tablet, TAKE 1 TABLET (81 MG TOTAL) BY MOUTH DAILY. NEEDS FOLLOW UP APPOINTMENT FOR MORE REFILLS, Disp: 30 tablet, Rfl: 0   atorvastatin (LIPITOR) 40 MG tablet, TAKE 1 TABLET (40 MG TOTAL) BY MOUTH DAILY. NEEDS FOLLOW UP APPOINTMENT FOR ANYMORE REFILLS, Disp: 90 tablet, Rfl: 0   carvedilol (COREG) 3.125 MG tablet, TAKE 1 TABLET (3.125 MG TOTAL) BY MOUTH TWO (TWO) TIMES DAILY. NEEDS APPOINTMENT FOR MORE REFILLS, Disp: 180 tablet, Rfl: 0   dapagliflozin propanediol (FARXIGA) 10 MG TABS tablet, TAKE 1 TABLET (10 MG TOTAL) BY  MOUTH DAILY WITH BREAKFAST. NEEDS FOLLOW UP APPOINTMENT FOR ANYMORE REFILLS, Disp: 90 tablet, Rfl: 0   digoxin (LANOXIN) 0.125 MG tablet, TAKE ONE TABLET BY MOUTH DAILY, Disp: 30 tablet, Rfl: 3   ENTRESTO 24-26 MG, TAKE ONE TABLET BY MOUTH TWICE A DAY, Disp: 60 tablet, Rfl: 10   FEROSUL 325 (65 Fe) MG tablet, TAKE 1 TABLET (325 MG TOTAL) BY MOUTH TWO (TWO) TIMES DAILY, Disp: 30 tablet, Rfl: 1   furosemide (LASIX) 40 MG tablet, Take 1 tablet (40 mg total) by mouth daily., Disp: 90 tablet, Rfl: 3   potassium chloride SA  (KLOR-CON M) 20 MEQ tablet, TAKE ONE TABLET BY MOUTH DAILY, Disp: 90 tablet, Rfl: 0   spironolactone (ALDACTONE) 25 MG tablet, Take 1 tablet (25 mg total) by mouth at bedtime., Disp: 30 tablet, Rfl: 11 No Known Allergies   Social History   Socioeconomic History   Marital status: Single    Spouse name: Not on file   Number of children: Not on file   Years of education: Not on file   Highest education level: Not on file  Occupational History   Not on file  Tobacco Use   Smoking status: Former    Current packs/day: 0.50    Types: Cigarettes   Smokeless tobacco: Never  Vaping Use   Vaping status: Never Used  Substance and Sexual Activity   Alcohol use: Yes    Comment: daily   Drug use: Yes    Types: Marijuana   Sexual activity: Not on file  Other Topics Concern   Not on file  Social History Narrative   Not on file   Social Determinants of Health   Financial Resource Strain: Not on file  Food Insecurity: Food Insecurity Present (10/17/2021)   Hunger Vital Sign    Worried About Running Out of Food in the Last Year: Often true    Ran Out of Food in the Last Year: Sometimes true  Transportation Needs: Unmet Transportation Needs (12/26/2022)   PRAPARE - Administrator, Civil Service (Medical): Yes    Lack of Transportation (Non-Medical): Yes  Physical Activity: Not on file  Stress: Not on file  Social Connections: Not on file  Intimate Partner Violence: Not on file    Physical Exam      Future Appointments  Date Time Provider Department Center  04/24/2023  1:00 PM [facility]-MR 1 [facility]  06/19/2023  1:15 PM [facility] DEVICE REMOTES [facility]  09/18/2023  1:15 PM [facility] DEVICE REMOTES [facility]  12/18/2023  1:15 PM [facility] DEVICE REMOTES [facility]  03/18/2024  1:15 PM [facility] DEVICE REMOTES [facility]

## 2023-04-09 NOTE — Telephone Encounter (Signed)
Pt last seen 01/14/23 .Marland Kitchen  Okay to place bone density now  and have follow up 4 month follow up in early December or mid December?

## 2023-04-10 NOTE — Telephone Encounter (Signed)
Pt called and lvm to return call to schedule a follow up appt

## 2023-04-17 ENCOUNTER — Other Ambulatory Visit (HOSPITAL_COMMUNITY): Payer: Self-pay | Admitting: Emergency Medicine

## 2023-04-17 NOTE — Progress Notes (Signed)
Paramedicine Encounter    Patient ID: DETERRIUS Jeremy Powers, male    DOB: Jun 29, 1955, 67 y.o.   MRN: [id number]   Complaints  Not  Assessment A&O x 4, ski  Compliance with meds Missed 1 morning dosemeds  Pill box filled x 2 weeks  Refills needed NONE  Meds changes since last visit NONE    Social changes NONE   BP 130/70 (BP Location: Left Arm, Patient Position: Sitting, Cuff Size: Normal)   Pulse 60   Resp 16   Wt 132 lb 3.2 oz (60 kg)   SpO2 98%   BMI 20.10 kg/m  Weight yesterday- not taken Last visit weight-131lb   [name] reports to be feeling well.  He denies chest pain or SOB.  Lung sounds clear and equal throughout and no peripheral edema noted.  Pt has done well with med compliance.  Med box reconciled for 2 weeks. No refills needed at this time.  He has an MRI scheduled 04/24/23 @ 1:00 and needs transportation and message sent to [facility] for assistance w/ same.    ACTION: Home visit completed  Bethanie Dicker [phone] 04/17/23  Patient Care Team: [name] as PCP - General (Internal Medicine) [name] Bevelyn Buckles, MD as PCP - Cardiology (Cardiology)  Patient Active Problem List   Diagnosis Date Noted   Lipoma 12/18/2022   Biventricular ICD (implantable cardioverter-defibrillator) in place 09/04/2022   NICM (nonischemic cardiomyopathy) (HCC) 03/20/2022   Malnutrition of moderate degree 09/23/2021   Dyslipidemia 09/21/2021   Anemia 09/21/2021   AKI (acute kidney injury) (HCC) 09/21/2021   Essential hypertension 09/20/2021   CHF (congestive heart failure) (HCC) 05/08/2021   Acute on chronic systolic CHF (congestive heart failure) (HCC) 05/06/2021   Mitral regurgitation 12/26/2020   COVID-19 virus infection 12/24/2020   CHF exacerbation (HCC) 12/23/2020   Acute on chronic systolic HF (heart failure) (HCC) 06/24/2018   Aortic insufficiency 06/22/2018   Acute on chronic congestive heart failure (HCC) 06/22/2018   Elevated troponin  06/22/2018   CAD (coronary artery disease) 06/22/2018   Left bundle branch block 06/22/2018   History of iron deficiency anemia 06/22/2018   Acute on chronic heart failure (HCC) 06/22/2018   Chronic bilateral low back pain without sciatica 12/12/2015   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   Iron deficiency anemia 03/28/2015   Hyperlipidemia 10/12/2014    Current Outpatient Medications:    aspirin EC 81 MG tablet, TAKE 1 TABLET (81 MG TOTAL) BY MOUTH DAILY. NEEDS FOLLOW UP APPOINTMENT FOR MORE REFILLS, Disp: 30 tablet, Rfl: 0   atorvastatin (LIPITOR) 40 MG tablet, TAKE 1 TABLET (40 MG TOTAL) BY MOUTH DAILY. NEEDS FOLLOW UP APPOINTMENT FOR ANYMORE REFILLS, Disp: 90 tablet, Rfl: 0   carvedilol (COREG) 3.125 MG tablet, TAKE 1 TABLET (3.125 MG TOTAL) BY MOUTH TWO (TWO) TIMES DAILY. NEEDS APPOINTMENT FOR MORE REFILLS, Disp: 180 tablet, Rfl: 0   dapagliflozin propanediol (FARXIGA) 10 MG TABS tablet, TAKE 1 TABLET (10 MG TOTAL) BY MOUTH DAILY WITH BREAKFAST. NEEDS FOLLOW UP APPOINTMENT FOR ANYMORE REFILLS, Disp: 90 tablet, Rfl: 0   digoxin (LANOXIN) 0.125 MG tablet, TAKE ONE TABLET BY MOUTH DAILY, Disp: 30 tablet, Rfl: 3   ENTRESTO 24-26 MG, TAKE ONE TABLET BY MOUTH TWICE A DAY, Disp: 60 tablet, Rfl: 10   FEROSUL 325 (65 Fe) MG tablet, TAKE 1 TABLET (325 MG TOTAL) BY MOUTH TWO (TWO) TIMES DAILY, Disp: 30 tablet, Rfl: 1   furosemide (LASIX) 40 MG tablet, Take 1 tablet (40  mg total) by mouth daily., Disp: 90 tablet, Rfl: 3   potassium chloride SA (KLOR-CON M) 20 MEQ tablet, TAKE ONE TABLET BY MOUTH DAILY, Disp: 90 tablet, Rfl: 0   spironolactone (ALDACTONE) 25 MG tablet, Take 1 tablet (25 mg total) by mouth at bedtime., Disp: 30 tablet, Rfl: 11 No Known Allergies   Social History   Socioeconomic History   Marital status: Single    Spouse name: Not on file   Number of children: Not on file   Years of education: Not on file   Highest education level: Not on file  Occupational  History   Not on file  Tobacco Use   Smoking status: Former    Current packs/day: 0.50    Types: Cigarettes   Smokeless tobacco: Never  Vaping Use   Vaping status: Never Used  Substance and Sexual Activity   Alcohol use: Yes    Comment: daily   Drug use: Yes    Types: Marijuana   Sexual activity: Not on file  Other Topics Concern   Not on file  Social History Narrative   Not on file   Social Determinants of Health   Financial Resource Strain: Not on file  Food Insecurity: Food Insecurity Present (10/17/2021)   Hunger Vital Sign    Worried About Running Out of Food in the Last Year: Often true    Ran Out of Food in the Last Year: Sometimes true  Transportation Needs: Unmet Transportation Needs (12/26/2022)   PRAPARE - Administrator, Civil Service (Medical): Yes    Lack of Transportation (Non-Medical): Yes  Physical Activity: Not on file  Stress: Not on file  Social Connections: Not on file  Intimate Partner Violence: Not on file    Physical Exam      Future Appointments  Date Time Provider Department Center  04/24/2023  1:00 PM [facility]-MR 1 [facility]  06/19/2023  1:15 PM [facility] DEVICE REMOTES [facility]  09/18/2023  1:15 PM [facility] DEVICE REMOTES [facility]  12/18/2023  1:15 PM [facility] DEVICE REMOTES [facility]  03/18/2024  1:15 PM [facility] DEVICE REMOTES [facility]

## 2023-04-22 ENCOUNTER — Telehealth (HOSPITAL_COMMUNITY): Payer: Self-pay | Admitting: Licensed Clinical Social Worker

## 2023-04-22 NOTE — Telephone Encounter (Signed)
CSW consulted to help with ride to MRI on 12/4  CSW assisted in setting up ride through [facility] Dual Complete:  Pick up from home: 10:56am- will have 5 minute waiting window Pick up after MRI: set for 2:30pm but patient or staff can call at [phone] to request sooner ride if he is available before then Ride Confirmation number:  for initial ride #[id number], return ride home [phone]  Burna Sis, LCSW Clinical Social Worker [facility] Desk#: [phone] Cell#: [phone]

## 2023-04-24 ENCOUNTER — Ambulatory Visit (HOSPITAL_COMMUNITY): Payer: 59 | Attending: Sports Medicine

## 2023-04-24 ENCOUNTER — Other Ambulatory Visit (HOSPITAL_COMMUNITY): Payer: Self-pay | Admitting: Internal Medicine

## 2023-04-29 IMAGING — DX DG CHEST 2V
2 series · 2 of 2 positions shown · non-contrast
Comparison: 02/06/2021

CLINICAL DATA: Dyspnea

EXAM:
CHEST - 2 VIEW

[chest pa]
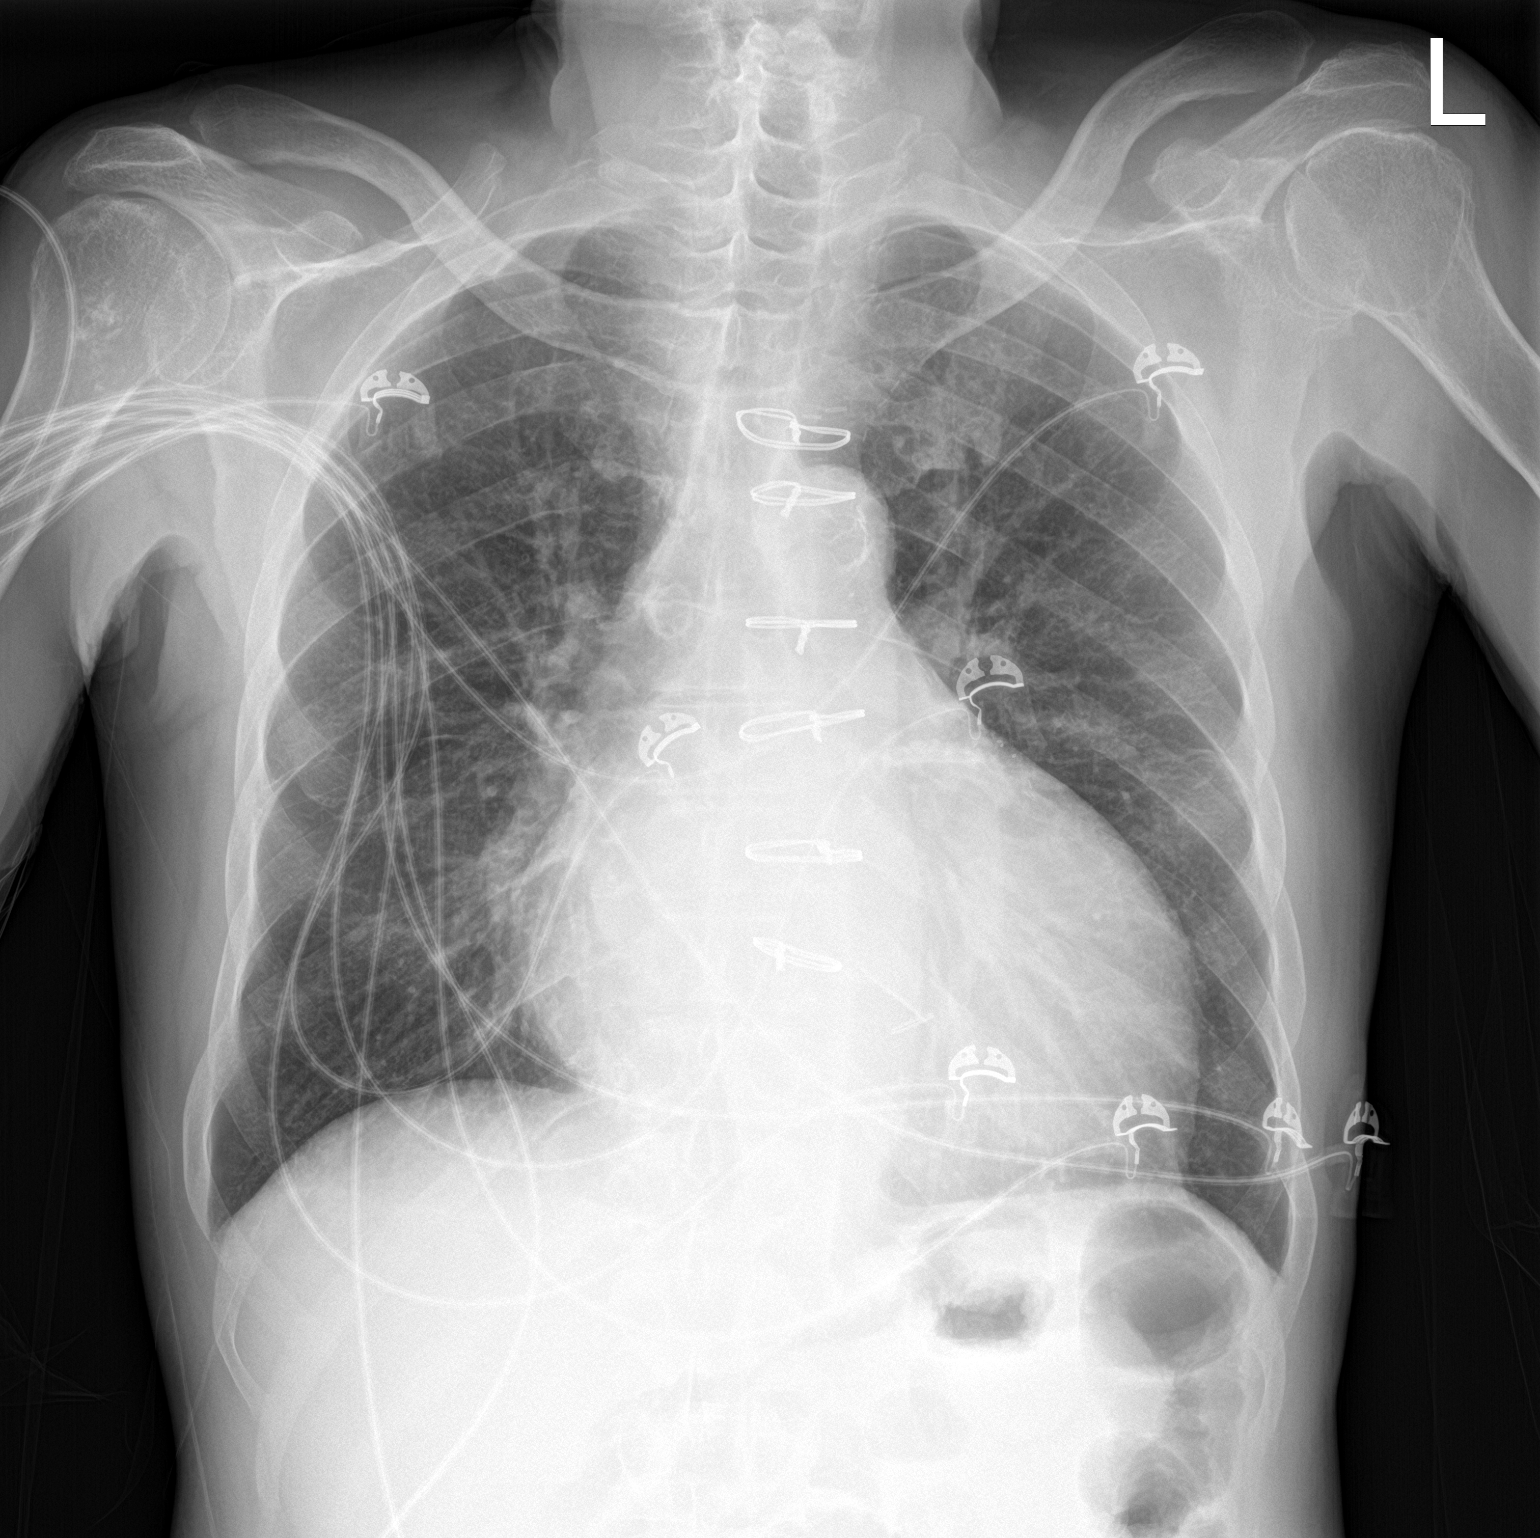

[chest lat]
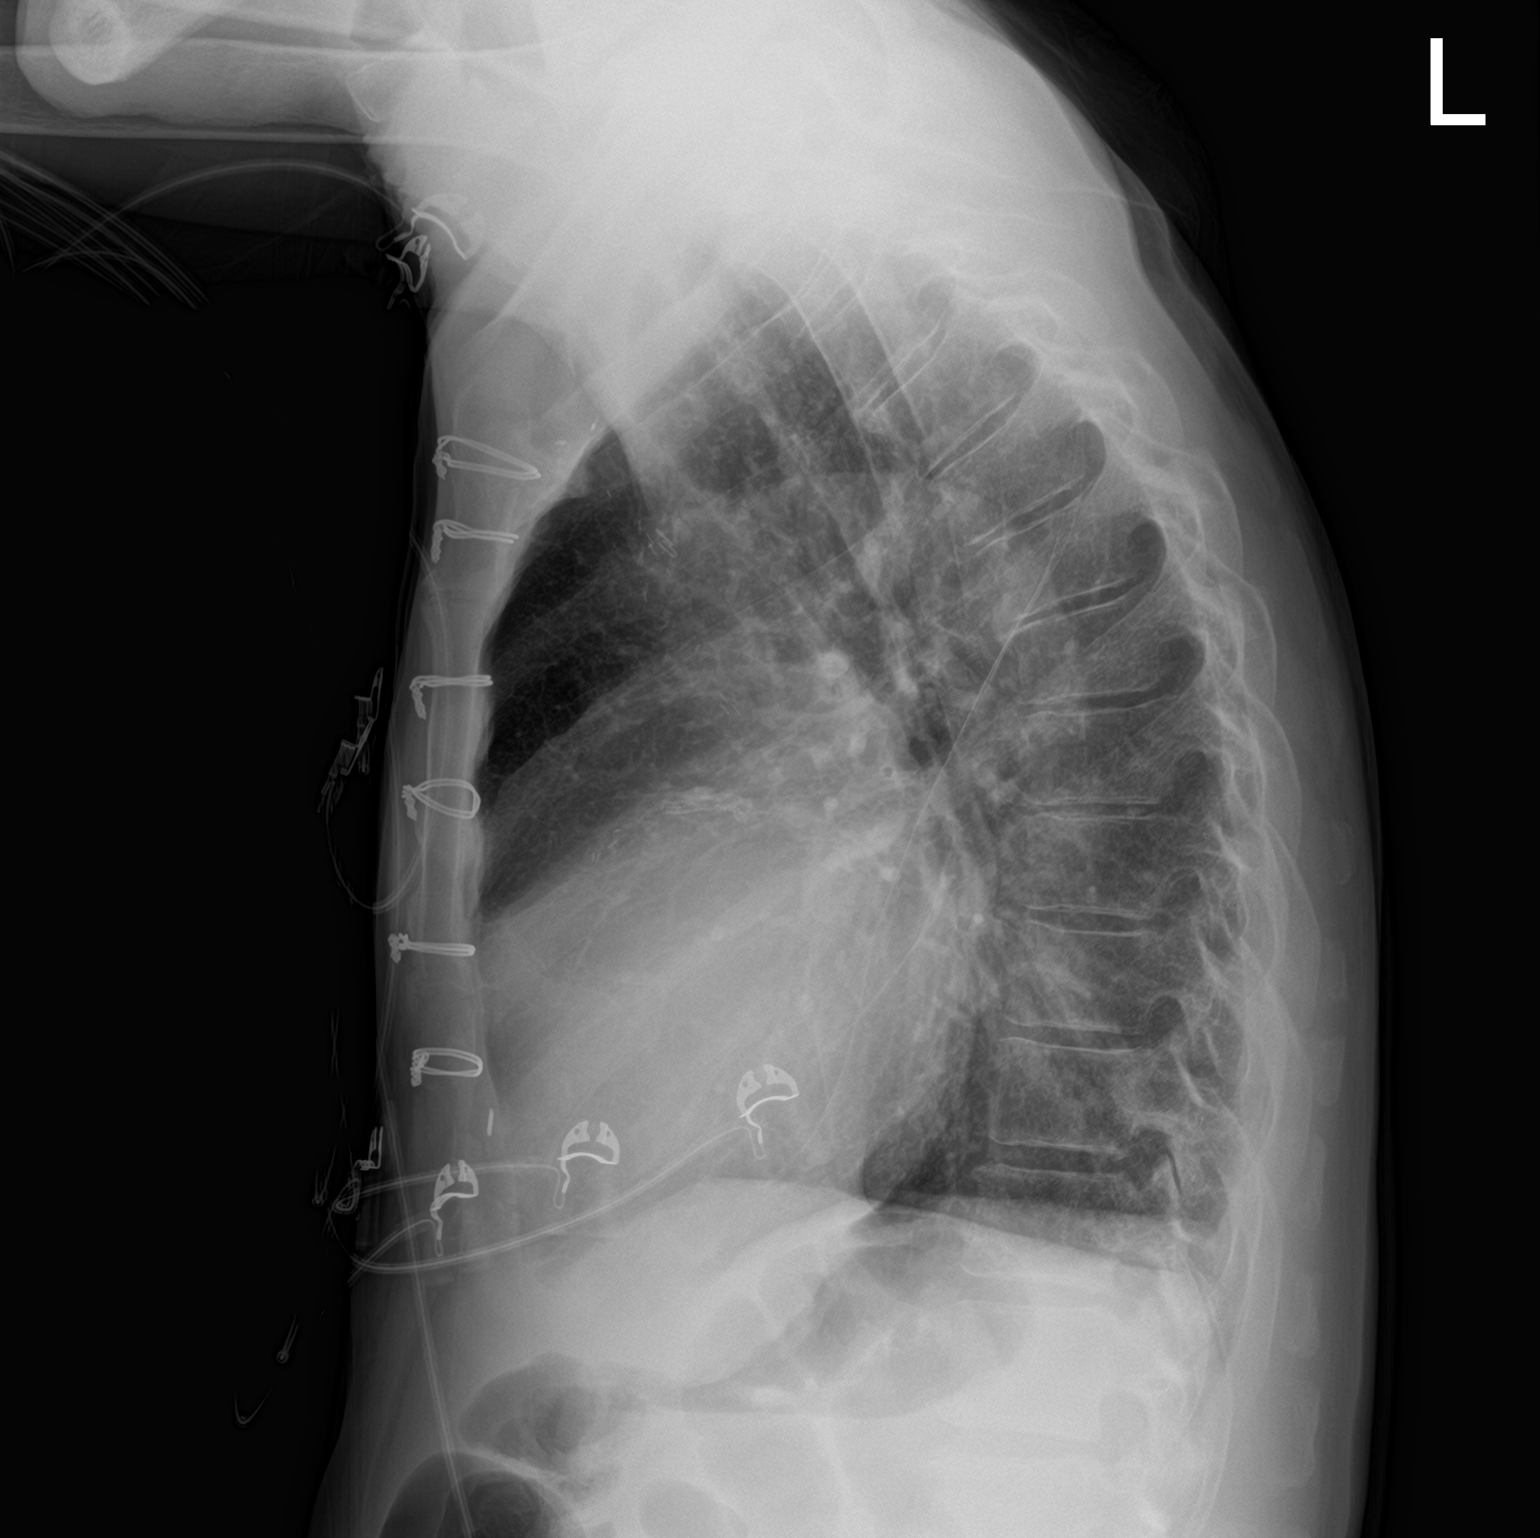

[2 of 2 positions shown; findings below may reference images not displayed]

FINDINGS: Lungs are clear. No pneumothorax or pleural effusion. Coronary
artery bypass grafting has been performed. Mild cardiomegaly is
stable. Pulmonary vascularity is normal. No acute bone abnormality.
IMPRESSION: No active cardiopulmonary disease.  Stable cardiomegaly

## 2023-05-01 ENCOUNTER — Other Ambulatory Visit (HOSPITAL_COMMUNITY): Payer: Self-pay | Admitting: Medical

## 2023-05-01 NOTE — Progress Notes (Signed)
Created in error    [name], EMT-Paramedic [phone] 05/16/2023

## 2023-05-14 ENCOUNTER — Other Ambulatory Visit (HOSPITAL_COMMUNITY): Payer: Self-pay | Admitting: Emergency Medicine

## 2023-05-14 NOTE — Progress Notes (Unsigned)
Paramedicine Encounter    Patient ID: Jeremy Powers, male    DOB: 1956-01-13, 67 y.o.   MRN: [id number]   Complaints***  Assessment***  Compliance with meds***  Pill box filled x 2weeks  Refills needed Digoxin, FeroSul  Meds changes since last visit***    Social changes***   There were no vitals taken for this visit. Weight yesterday-*** Last visit weight-***  ACTION: {Paramed [phone]}  [name], EMT-Paramedic [phone] 05/14/23  Patient Care Team: [name] as PCP - General (Internal Medicine) [name] Bevelyn Buckles, MD as PCP - Cardiology (Cardiology)  Patient Active Problem List   Diagnosis Date Noted   Lipoma 12/18/2022   Biventricular ICD (implantable cardioverter-defibrillator) in place 09/04/2022   NICM (nonischemic cardiomyopathy) (HCC) 03/20/2022   Malnutrition of moderate degree 09/23/2021   Dyslipidemia 09/21/2021   Anemia 09/21/2021   AKI (acute kidney injury) (HCC) 09/21/2021   Essential hypertension 09/20/2021   CHF (congestive heart failure) (HCC) 05/08/2021   Acute on chronic systolic CHF (congestive heart failure) (HCC) 05/06/2021   Mitral regurgitation 12/26/2020   COVID-19 virus infection 12/24/2020   CHF exacerbation (HCC) 12/23/2020   Acute on chronic systolic HF (heart failure) (HCC) 06/24/2018   Aortic insufficiency 06/22/2018   Acute on chronic congestive heart failure (HCC) 06/22/2018   Elevated troponin 06/22/2018   CAD (coronary artery disease) 06/22/2018   Left bundle branch block 06/22/2018   History of iron deficiency anemia 06/22/2018   Acute on chronic heart failure (HCC) 06/22/2018   Chronic bilateral low back pain without sciatica 12/12/2015   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   Iron deficiency anemia 03/28/2015   Hyperlipidemia 10/12/2014    Current Outpatient Medications:    aspirin EC 81 MG tablet, TAKE 1 TABLET (81 MG TOTAL) BY MOUTH DAILY. NEEDS FOLLOW UP APPOINTMENT FOR  MORE REFILLS, Disp: 30 tablet, Rfl: 0   atorvastatin (LIPITOR) 40 MG tablet, TAKE 1 TABLET (40 MG TOTAL) BY MOUTH DAILY. NEEDS FOLLOW UP APPOINTMENT FOR ANYMORE REFILLS, Disp: 90 tablet, Rfl: 0   carvedilol (COREG) 3.125 MG tablet, TAKE 1 TABLET (3.125 MG TOTAL) BY MOUTH TWO (TWO) TIMES DAILY. NEEDS APPOINTMENT FOR MORE REFILLS, Disp: 180 tablet, Rfl: 1   dapagliflozin propanediol (FARXIGA) 10 MG TABS tablet, TAKE 1 TABLET (10 MG TOTAL) BY MOUTH DAILY WITH BREAKFAST. NEEDS FOLLOW UP APPOINTMENT FOR ANYMORE REFILLS, Disp: 90 tablet, Rfl: 0   digoxin (LANOXIN) 0.125 MG tablet, TAKE ONE TABLET BY MOUTH DAILY, Disp: 30 tablet, Rfl: 3   ENTRESTO 24-26 MG, TAKE ONE TABLET BY MOUTH TWICE A DAY, Disp: 60 tablet, Rfl: 10   FEROSUL 325 (65 Fe) MG tablet, TAKE 1 TABLET (325 MG TOTAL) BY MOUTH TWO (TWO) TIMES DAILY, Disp: 30 tablet, Rfl: 1   furosemide (LASIX) 40 MG tablet, TAKE 1 TABLET (40 MG TOTAL) BY MOUTH DAILY, Disp: 90 tablet, Rfl: 0   potassium chloride SA (KLOR-CON M) 20 MEQ tablet, TAKE ONE TABLET BY MOUTH DAILY, Disp: 90 tablet, Rfl: 0   spironolactone (ALDACTONE) 25 MG tablet, Take 1 tablet (25 mg total) by mouth at bedtime., Disp: 30 tablet, Rfl: 11 No Known Allergies   Social History   Socioeconomic History   Marital status: Single    Spouse name: Not on file   Number of children: Not on file   Years of education: Not on file   Highest education level: Not on file  Occupational History   Not on file  Tobacco Use   Smoking status: Former  Current packs/day: 0.50    Types: Cigarettes   Smokeless tobacco: Never  Vaping Use   Vaping status: Never Used  Substance and Sexual Activity   Alcohol use: Yes    Comment: daily   Drug use: Yes    Types: Marijuana   Sexual activity: Not on file  Other Topics Concern   Not on file  Social History Narrative   Not on file   Social Drivers of Health   Financial Resource Strain: Not on file  Food Insecurity: Food Insecurity Present  (10/17/2021)   Hunger Vital Sign    Worried About Running Out of Food in the Last Year: Often true    Ran Out of Food in the Last Year: Sometimes true  Transportation Needs: Unmet Transportation Needs (04/22/2023)   PRAPARE - Administrator, Civil Service (Medical): Yes    Lack of Transportation (Non-Medical): Yes  Physical Activity: Not on file  Stress: Not on file  Social Connections: Not on file  Intimate Partner Violence: Not on file    Physical Exam      Future Appointments  Date Time Provider Department Center  06/19/2023  1:15 PM [facility] DEVICE REMOTES [facility]  09/18/2023  1:15 PM [facility] DEVICE REMOTES [facility]  12/18/2023  1:15 PM [facility] DEVICE REMOTES [facility]  03/18/2024  1:15 PM [facility] DEVICE REMOTES [facility]

## 2023-05-26 IMAGING — DX DG CHEST 2V
2 series · 2 of 2 positions shown · non-contrast
Comparison: Chest radiograph dated May 06, 2021

CLINICAL DATA: Follow-up for CHF.

EXAM:
CHEST - 2 VIEW

[chest pa]
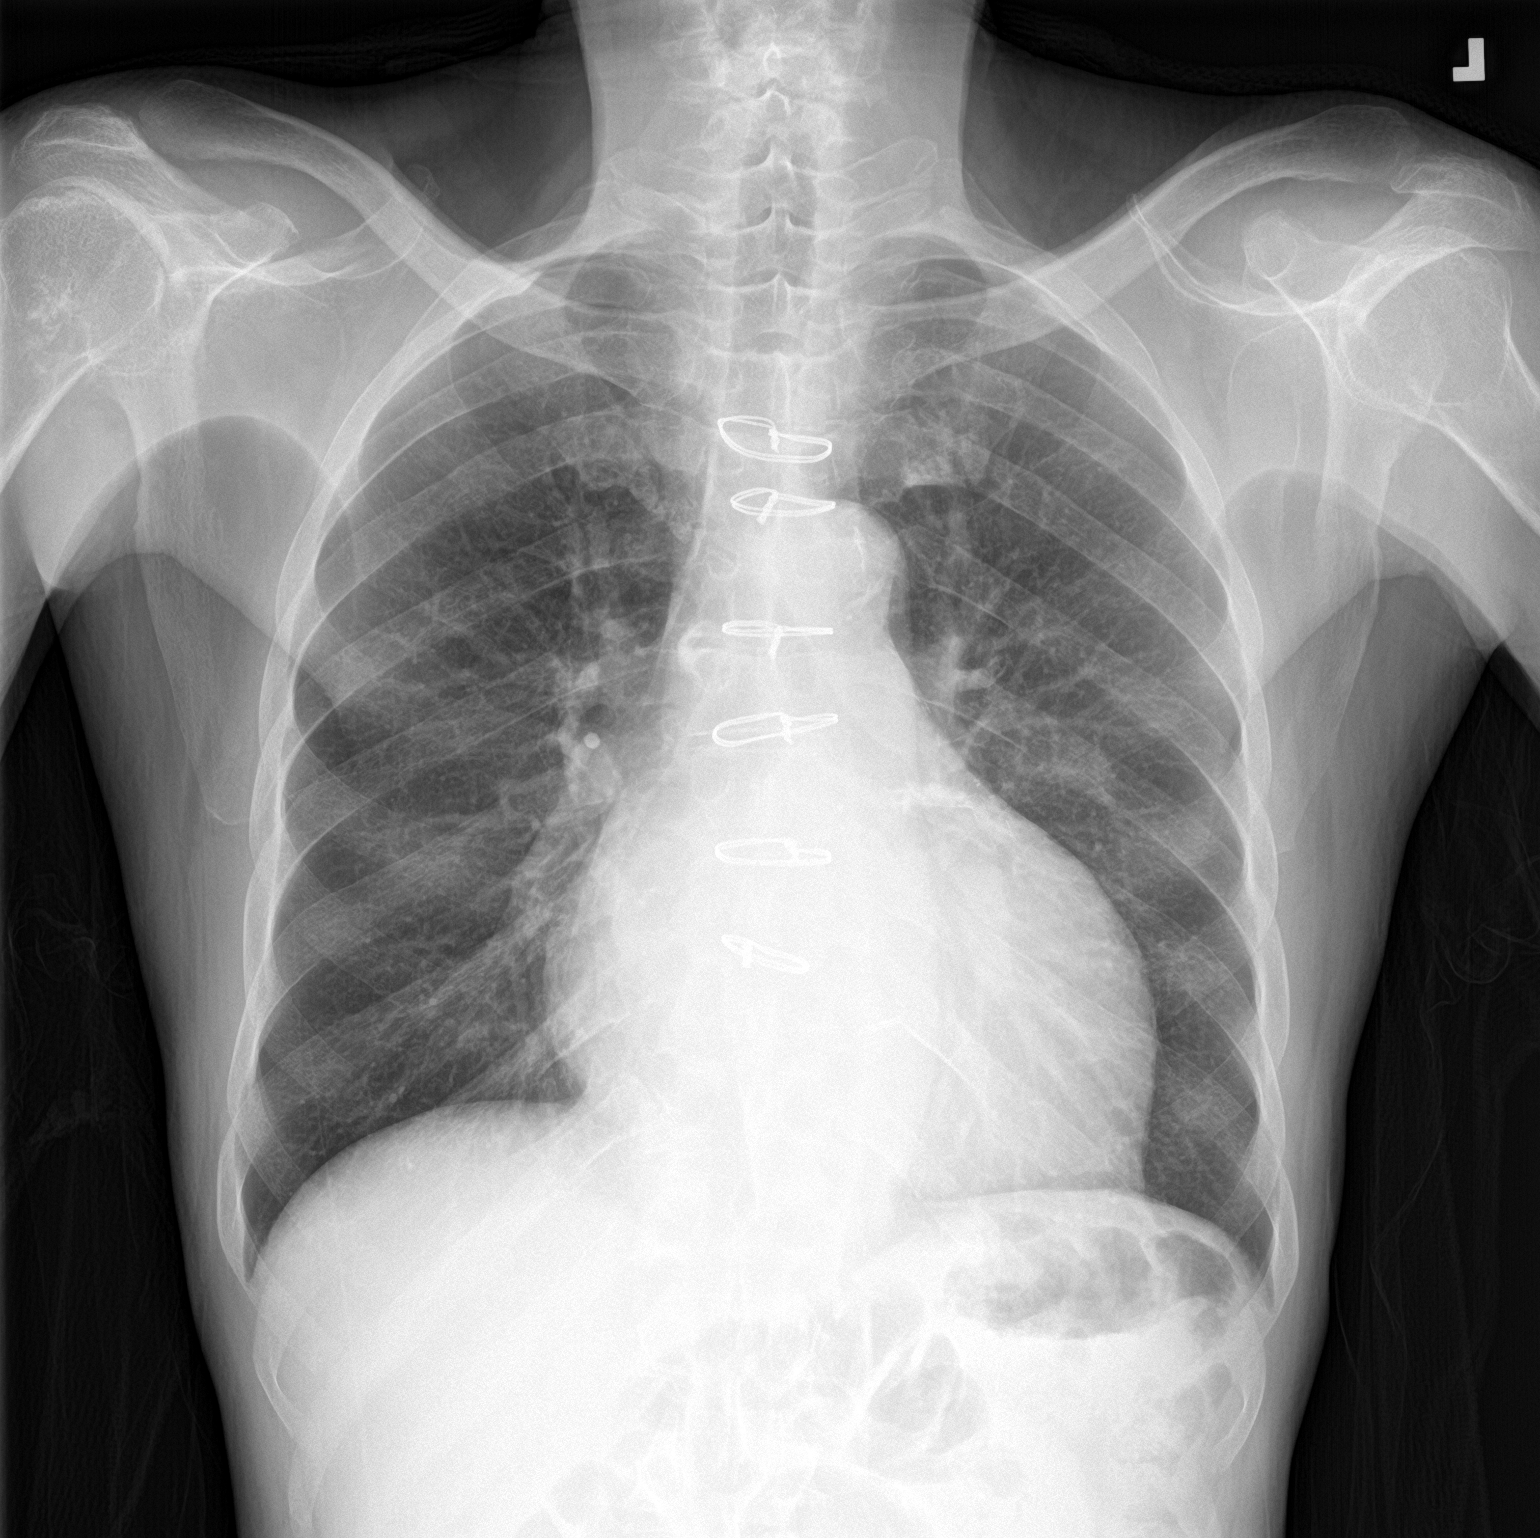

[chest lat]
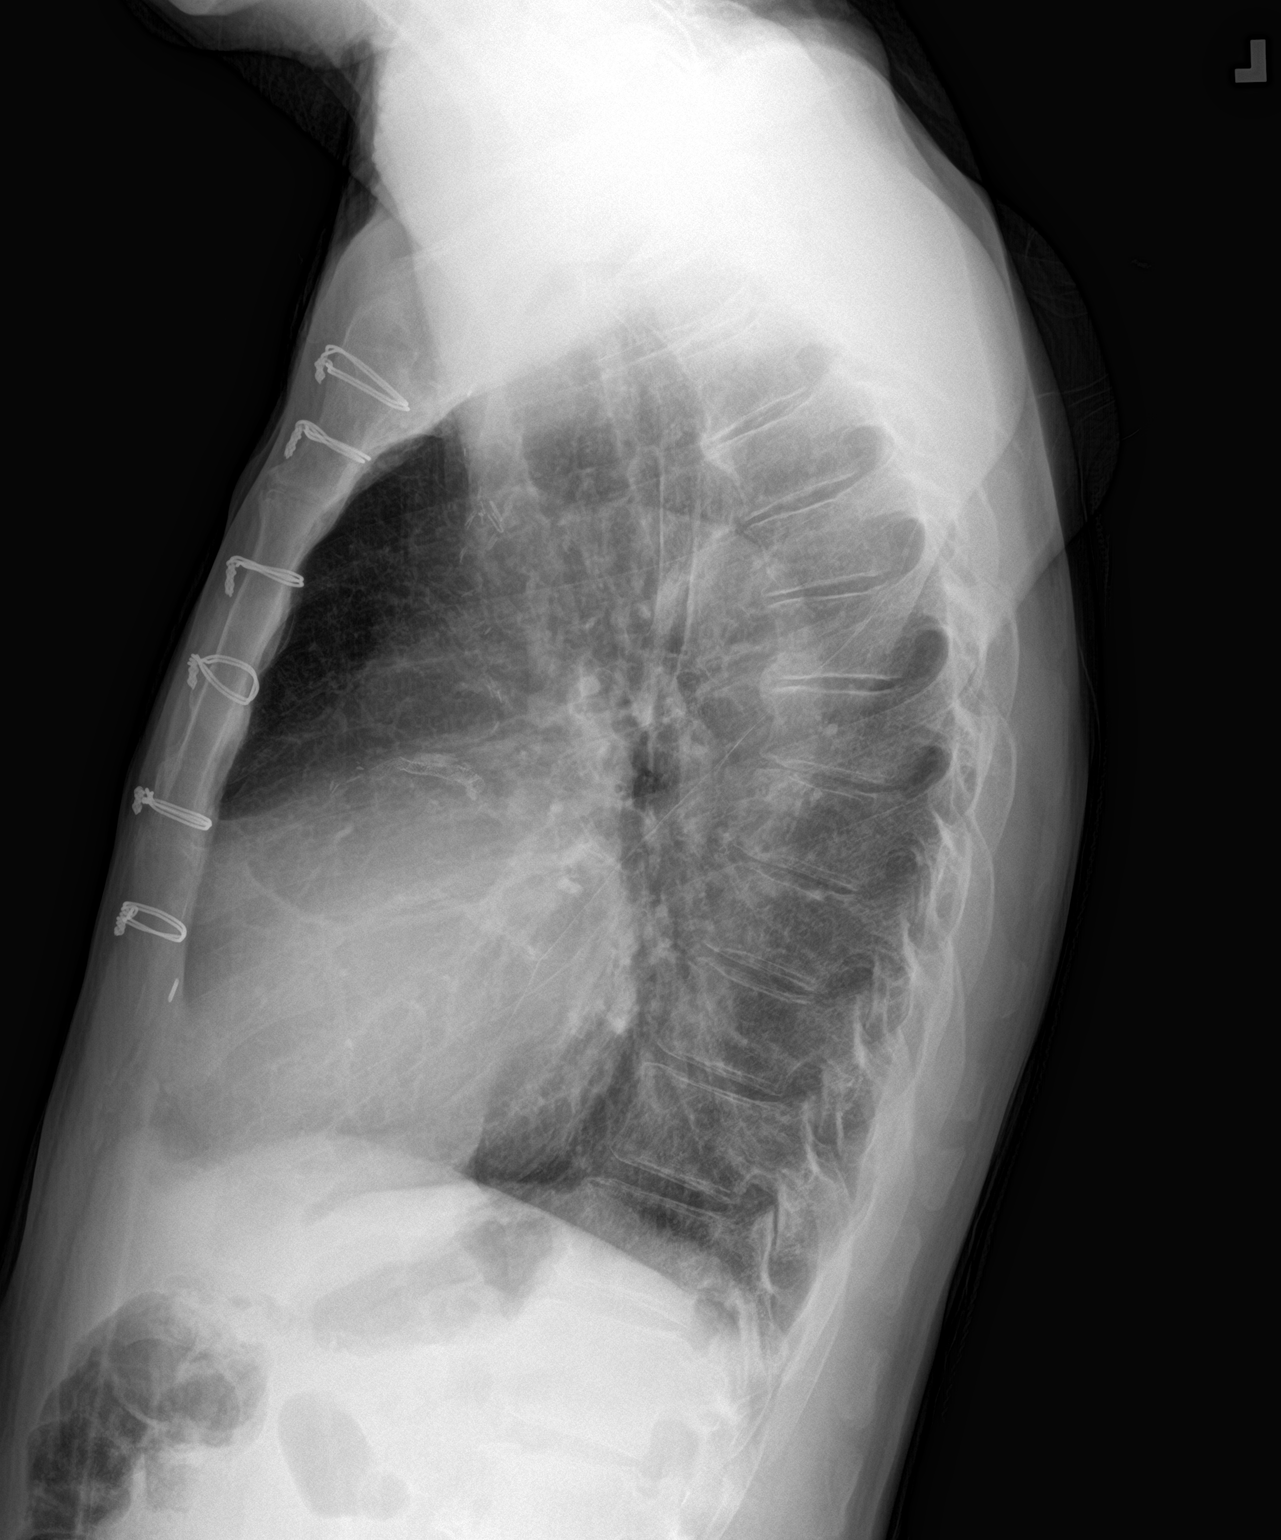

[2 of 2 positions shown; findings below may reference images not displayed]

FINDINGS: The heart is enlarged. Evidence of prior CABG. Atherosclerotic
calcification of the aortic arch. Lungs are clear without evidence
of focal consolidation or pulmonary edema. No pleural effusion.
IMPRESSION: Stable cardiomegaly without evidence of acute cardiopulmonary
process.

## 2023-05-28 ENCOUNTER — Other Ambulatory Visit (HOSPITAL_COMMUNITY): Payer: Self-pay | Admitting: Emergency Medicine

## 2023-05-28 NOTE — Progress Notes (Signed)
 Paramedicine Encounter    Patient ID: Jeremy Powers, male    DOB: [birth year], 68 y.o.   MRN: [id number]   Complaints Has had episodes of dizziness w/ nausea and occasional episodes of vomiting.  See narrative below  Assessment A&O x 4, skin W&D w/ good color. Denies chest pain or SOB, Lung sounds clear and equal throughout.  No peripheral edema  Compliance with meds Missed Sun, Mon meds  Pill box filled x 2 weeks  Refills needed Ferrous Sulfate   Meds changes since last visit NONE  P\  Social changes NONE  ATF Jeremy Powers A&O x 4, skin W&D w/ good color.  He states, I feel great today  He admits to missing Sun & Mon meds due to forgetting. Med box reconciled x 2 weeks. Lung sounds clear and equal bilat and no peripheral edema noted.  He advises he has had occasional episodes of dizzieness w/ nausea and advises that he occasionally vomits.  He cannot correlate these dizzy spells w a specific time or activity.  They can happen when he walks and/or when he's just sitting on the sofa watching TV.  He does not vomit every time the dizziness happens.  He says he generally can continue walking during the episodes but will sit down if he gets too dizzy.  He also denies chest pains during these dizzy spells   BP (!) 140/68 (BP Location: Left Arm, Patient Position: Sitting, Cuff Size: Normal)   Pulse 76   Resp 14   Wt 127 lb 9.6 oz (57.9 kg)   SpO2 98%   BMI 19.40 kg/m  Weight yesterday- not taken Last visit weight-127lb  ACTION: Home visit completed  Jeremy Powers [phone] 05/28/23  Patient Care Team: Dorina Dallas RIGGERS as PCP - General (Internal Medicine) Cherrie Toribio SAUNDERS, MD as PCP - Cardiology (Cardiology)  Patient Active Problem List   Diagnosis Date Noted   Lipoma 12/18/2022   Biventricular ICD (implantable cardioverter-defibrillator) in place 09/04/2022   NICM (nonischemic cardiomyopathy) (HCC) 03/20/2022   Malnutrition of moderate degree  09/23/2021   Dyslipidemia 09/21/2021   Anemia 09/21/2021   AKI (acute kidney injury) (HCC) 09/21/2021   Essential hypertension 09/20/2021   CHF (congestive heart failure) (HCC) 05/08/2021   Acute on chronic systolic CHF (congestive heart failure) (HCC) 05/06/2021   Mitral regurgitation 12/26/2020   COVID-19 virus infection 12/24/2020   CHF exacerbation (HCC) 12/23/2020   Acute on chronic systolic HF (heart failure) (HCC) 06/24/2018   Aortic insufficiency 06/22/2018   Acute on chronic congestive heart failure (HCC) 06/22/2018   Elevated troponin 06/22/2018   CAD (coronary artery disease) 06/22/2018   Left bundle branch block 06/22/2018   History of iron  deficiency anemia 06/22/2018   Acute on chronic heart failure (HCC) 06/22/2018   Chronic bilateral low back pain without sciatica 12/12/2015   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   Iron  deficiency anemia 03/28/2015   Hyperlipidemia 10/12/2014    Current Outpatient Medications:    aspirin  EC 81 MG tablet, TAKE 1 TABLET (81 MG TOTAL) BY MOUTH DAILY. NEEDS FOLLOW UP APPOINTMENT FOR MORE REFILLS, Disp: 30 tablet, Rfl: 0   atorvastatin  (LIPITOR) 40 MG tablet, TAKE 1 TABLET (40 MG TOTAL) BY MOUTH DAILY. NEEDS FOLLOW UP APPOINTMENT FOR ANYMORE REFILLS, Disp: 90 tablet, Rfl: 0   carvedilol  (COREG ) 3.125 MG tablet, TAKE 1 TABLET (3.125 MG TOTAL) BY MOUTH TWO (TWO) TIMES DAILY. NEEDS APPOINTMENT FOR MORE REFILLS, Disp: 180 tablet, Rfl: 1   dapagliflozin  propanediol (FARXIGA )  10 MG TABS tablet, TAKE 1 TABLET (10 MG TOTAL) BY MOUTH DAILY WITH BREAKFAST. NEEDS FOLLOW UP APPOINTMENT FOR ANYMORE REFILLS, Disp: 90 tablet, Rfl: 0   digoxin  (LANOXIN ) 0.125 MG tablet, TAKE ONE TABLET BY MOUTH DAILY, Disp: 30 tablet, Rfl: 3   ENTRESTO  24-26 MG, TAKE ONE TABLET BY MOUTH TWICE A DAY, Disp: 60 tablet, Rfl: 10   FEROSUL 325 (65 Fe) MG tablet, TAKE 1 TABLET (325 MG TOTAL) BY MOUTH TWO (TWO) TIMES DAILY, Disp: 30 tablet, Rfl: 1   furosemide  (LASIX )  40 MG tablet, TAKE 1 TABLET (40 MG TOTAL) BY MOUTH DAILY, Disp: 90 tablet, Rfl: 0   potassium chloride  SA (KLOR-CON  M) 20 MEQ tablet, TAKE ONE TABLET BY MOUTH DAILY, Disp: 90 tablet, Rfl: 0   spironolactone  (ALDACTONE ) 25 MG tablet, Take 1 tablet (25 mg total) by mouth at bedtime., Disp: 30 tablet, Rfl: 11 No Known Allergies   Social History   Socioeconomic History   Marital status: Single    Spouse name: Not on file   Number of children: Not on file   Years of education: Not on file   Highest education level: Not on file  Occupational History   Not on file  Tobacco Use   Smoking status: Former    Current packs/day: 0.50    Types: Cigarettes   Smokeless tobacco: Never  Vaping Use   Vaping status: Never Used  Substance and Sexual Activity   Alcohol use: Yes    Comment: daily   Drug use: Yes    Types: Marijuana   Sexual activity: Not on file  Other Topics Concern   Not on file  Social History Narrative   Not on file   Social Drivers of Health   Financial Resource Strain: Not on file  Food Insecurity: Food Insecurity Present (10/17/2021)   Hunger Vital Sign    Worried About Running Out of Food in the Last Year: Often true    Ran Out of Food in the Last Year: Sometimes true  Transportation Needs: Unmet Transportation Needs (04/22/2023)   PRAPARE - Administrator, Civil Service (Medical): Yes    Lack of Transportation (Non-Medical): Yes  Physical Activity: Not on file  Stress: Not on file  Social Connections: Not on file  Intimate Partner Violence: Not on file    Physical Exam      Future Appointments  Date Time Provider Department Center  06/19/2023  1:15 PM [facility]  09/18/2023  1:15 PM [facility]  12/18/2023  1:15 PM [facility]  03/18/2024  1:15 PM [facility]

## 2023-06-11 ENCOUNTER — Other Ambulatory Visit (HOSPITAL_COMMUNITY): Payer: Self-pay | Admitting: Emergency Medicine

## 2023-06-11 NOTE — Progress Notes (Unsigned)
Paramedicine Encounter    Patient ID: Jeremy Powers, male    DOB: 20-Jan-1956, 68 y.o.   MRN: [id number]   Complaints NONE  Assessment A&O 4, skin W&D w/ good color.  Denies chest pain or SOB.  Lung sounds clear and equal throughout.  No peripheral edema noted  Compliance with meds No - got off track  Pill box filled x 2 weeks  Refills needed NONE  Meds changes since last visit NONE    Social changes NONE   BP (!) 160/70 (BP Location: Left Arm, Patient Position: Sitting, Cuff Size: Normal)   Pulse 85   Resp 16   Wt 127 lb 9.6 oz (57.9 kg)   SpO2 98%   BMI 19.40 kg/m  Weight yesterday- not taken Last visit weight-131lb    Mr. Vuncannon continues to get off track with is meds and I'm not sure how this is happening exactly and I am working with him to get back to proper compliance.  He reports that he is feeling well and has no complaints.   Pill box reconciled x 2 weeks and I reviewed with him and he verbalizes he understands.  He and his family are going to be moving into another apartment in the same complex.   Next home visit 06/18/23 @ 9:30.  ACTION: Home visit completed  Bethanie Dicker [phone] 06/11/23  Patient Care Team: [name] as PCP - General (Internal Medicine) [name] Bevelyn Buckles, MD as PCP - Cardiology (Cardiology)  Patient Active Problem List   Diagnosis Date Noted   Lipoma 12/18/2022   Biventricular ICD (implantable cardioverter-defibrillator) in place 09/04/2022   NICM (nonischemic cardiomyopathy) (HCC) 03/20/2022   Malnutrition of moderate degree 09/23/2021   Dyslipidemia 09/21/2021   Anemia 09/21/2021   AKI (acute kidney injury) (HCC) 09/21/2021   Essential hypertension 09/20/2021   CHF (congestive heart failure) (HCC) 05/08/2021   Acute on chronic systolic CHF (congestive heart failure) (HCC) 05/06/2021   Mitral regurgitation 12/26/2020   COVID-19 virus infection 12/24/2020   CHF exacerbation (HCC) 12/23/2020    Acute on chronic systolic HF (heart failure) (HCC) 06/24/2018   Aortic insufficiency 06/22/2018   Acute on chronic congestive heart failure (HCC) 06/22/2018   Elevated troponin 06/22/2018   CAD (coronary artery disease) 06/22/2018   Left bundle branch block 06/22/2018   History of iron deficiency anemia 06/22/2018   Acute on chronic heart failure (HCC) 06/22/2018   Chronic bilateral low back pain without sciatica 12/12/2015   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   Iron deficiency anemia 03/28/2015   Hyperlipidemia 10/12/2014    Current Outpatient Medications:    aspirin EC 81 MG tablet, TAKE 1 TABLET (81 MG TOTAL) BY MOUTH DAILY. NEEDS FOLLOW UP APPOINTMENT FOR MORE REFILLS, Disp: 30 tablet, Rfl: 0   atorvastatin (LIPITOR) 40 MG tablet, TAKE 1 TABLET (40 MG TOTAL) BY MOUTH DAILY. NEEDS FOLLOW UP APPOINTMENT FOR ANYMORE REFILLS, Disp: 90 tablet, Rfl: 0   carvedilol (COREG) 3.125 MG tablet, TAKE 1 TABLET (3.125 MG TOTAL) BY MOUTH TWO (TWO) TIMES DAILY. NEEDS APPOINTMENT FOR MORE REFILLS, Disp: 180 tablet, Rfl: 1   dapagliflozin propanediol (FARXIGA) 10 MG TABS tablet, TAKE 1 TABLET (10 MG TOTAL) BY MOUTH DAILY WITH BREAKFAST. NEEDS FOLLOW UP APPOINTMENT FOR ANYMORE REFILLS, Disp: 90 tablet, Rfl: 0   digoxin (LANOXIN) 0.125 MG tablet, TAKE ONE TABLET BY MOUTH DAILY, Disp: 30 tablet, Rfl: 3   ENTRESTO 24-26 MG, TAKE ONE TABLET BY MOUTH TWICE A DAY, Disp: 60  tablet, Rfl: 10   FEROSUL 325 (65 Fe) MG tablet, TAKE 1 TABLET (325 MG TOTAL) BY MOUTH TWO (TWO) TIMES DAILY, Disp: 30 tablet, Rfl: 1   furosemide (LASIX) 40 MG tablet, TAKE 1 TABLET (40 MG TOTAL) BY MOUTH DAILY, Disp: 90 tablet, Rfl: 0   potassium chloride SA (KLOR-CON M) 20 MEQ tablet, TAKE ONE TABLET BY MOUTH DAILY, Disp: 90 tablet, Rfl: 0   spironolactone (ALDACTONE) 25 MG tablet, Take 1 tablet (25 mg total) by mouth at bedtime., Disp: 30 tablet, Rfl: 11 No Known Allergies   Social History   Socioeconomic History    Marital status: Single    Spouse name: Not on file   Number of children: Not on file   Years of education: Not on file   Highest education level: Not on file  Occupational History   Not on file  Tobacco Use   Smoking status: Former    Current packs/day: 0.50    Types: Cigarettes   Smokeless tobacco: Never  Vaping Use   Vaping status: Never Used  Substance and Sexual Activity   Alcohol use: Yes    Comment: daily   Drug use: Yes    Types: Marijuana   Sexual activity: Not on file  Other Topics Concern   Not on file  Social History Narrative   Not on file   Social Drivers of Health   Financial Resource Strain: Not on file  Food Insecurity: Food Insecurity Present (10/17/2021)   Hunger Vital Sign    Worried About Running Out of Food in the Last Year: Often true    Ran Out of Food in the Last Year: Sometimes true  Transportation Needs: Unmet Transportation Needs (04/22/2023)   PRAPARE - Administrator, Civil Service (Medical): Yes    Lack of Transportation (Non-Medical): Yes  Physical Activity: Not on file  Stress: Not on file  Social Connections: Not on file  Intimate Partner Violence: Not on file    Physical Exam      Future Appointments  Date Time Provider Department Center  06/19/2023  1:15 PM [facility]  09/18/2023  1:15 PM [facility]  12/18/2023  1:15 PM [facility]  03/18/2024  1:15 PM [facility]

## 2023-06-19 ENCOUNTER — Ambulatory Visit (INDEPENDENT_AMBULATORY_CARE_PROVIDER_SITE_OTHER): Payer: 59

## 2023-06-19 DIAGNOSIS — I447 Left bundle-branch block, unspecified: Secondary | ICD-10-CM | POA: Diagnosis not present

## 2023-06-19 LAB — CUP PACEART REMOTE DEVICE CHECK
Battery Remaining Longevity: 132 mo
Battery Remaining Percentage: 100 %
Brady Statistic RA Percent Paced: 2 %
Brady Statistic RV Percent Paced: 33 %
Date Time Interrogation Session: 20250129051900
HighPow Impedance: 73 Ohm
Implantable Lead Connection Status: 753985
Implantable Lead Connection Status: 753985
Implantable Lead Connection Status: 753985
Implantable Lead Implant Date: 20240108
Implantable Lead Implant Date: 20240108
Implantable Lead Implant Date: 20240108
Implantable Lead Location: 753858
Implantable Lead Location: 753859
Implantable Lead Location: 753860
Implantable Lead Model: 137
Implantable Lead Model: 4671
Implantable Lead Model: 7841
Implantable Lead Serial Number: 1396493
Implantable Lead Serial Number: 301415
Implantable Lead Serial Number: 881901
Implantable Pulse Generator Implant Date: 20240108
Lead Channel Impedance Value: 497 Ohm
Lead Channel Impedance Value: 500 Ohm
Lead Channel Impedance Value: 550 Ohm
Lead Channel Pacing Threshold Amplitude: 0.4 V
Lead Channel Pacing Threshold Amplitude: 1 V
Lead Channel Pacing Threshold Amplitude: 1 V
Lead Channel Pacing Threshold Pulse Width: 0.4 ms
Lead Channel Pacing Threshold Pulse Width: 0.4 ms
Lead Channel Pacing Threshold Pulse Width: 0.4 ms
Lead Channel Setting Pacing Amplitude: 2.5 V
Lead Channel Setting Pacing Amplitude: 2.5 V
Lead Channel Setting Pacing Amplitude: 2.5 V
Lead Channel Setting Pacing Pulse Width: 0.4 ms
Lead Channel Setting Pacing Pulse Width: 0.4 ms
Lead Channel Setting Sensing Sensitivity: 0.5 mV
Lead Channel Setting Sensing Sensitivity: 1 mV
Pulse Gen Serial Number: 394480

## 2023-06-20 ENCOUNTER — Other Ambulatory Visit (HOSPITAL_COMMUNITY): Payer: Self-pay | Admitting: Emergency Medicine

## 2023-06-20 NOTE — Progress Notes (Signed)
Paramedicine Encounter    Patient ID: Jeremy Powers, male    DOB: [birth year], 68 y.o.   MRN: [id number]   Complaints NONE  Assessment A&O x 4, skin W&D w/ good color.  Compliance with meds YES  Pill box filled x 2 weeks  Refills needed NONE  Meds changes since last visit NONE    Social changes Moved into new apartment in same complex.  Much better living accommodations.   BP (!) 160/80 (BP Location: Left Arm, Patient Position: Sitting, Cuff Size: Normal)   Pulse 78   Resp 16   Wt 127 lb 9.6 oz (57.9 kg)   SpO2 99%   BMI 19.40 kg/m  Weight yesterday-not taken Last visit weight-127lb   On arrival to pt's new apartment Mr. Corron was walking through the parking lot.  He often takes morning walks for exercise.  Continued inside to complete visit.  Pt reports to be feeling well.  He denies chest pain or SOB.  Lung sounds clear and equal bilat and no peripheral edema noted.  He has done well with his med compliance and states he is grateful that I am still helping him.  Med box reconciled x 2 weeks and no refills needed at this time. Mr. Ramthun and his family have moved into a larger nicer apartment in the same apartment complex.  I assisted pt in getting his address changed with [facility] and [facility]. Also set up appointment for pt w/ Dr. Cherre Robins 06/25/23 to follow up about his left knee swelling.  Family is aware and will provide him transportation for this visit. Next home appointment 06/27/23 @ 9:15.  ACTION: Home visit completed  Bethanie Dicker [phone] 06/20/23  Patient Care Team: [name] as PCP - General (Internal Medicine) [name] Bevelyn Buckles, MD as PCP - Cardiology (Cardiology)  Patient Active Problem List   Diagnosis Date Noted   Lipoma 12/18/2022   Biventricular ICD (implantable cardioverter-defibrillator) in place 09/04/2022   NICM (nonischemic cardiomyopathy) (HCC) 03/20/2022   Malnutrition of moderate degree 09/23/2021    Dyslipidemia 09/21/2021   Anemia 09/21/2021   AKI (acute kidney injury) (HCC) 09/21/2021   Essential hypertension 09/20/2021   CHF (congestive heart failure) (HCC) 05/08/2021   Acute on chronic systolic CHF (congestive heart failure) (HCC) 05/06/2021   Mitral regurgitation 12/26/2020   COVID-19 virus infection 12/24/2020   CHF exacerbation (HCC) 12/23/2020   Acute on chronic systolic HF (heart failure) (HCC) 06/24/2018   Aortic insufficiency 06/22/2018   Acute on chronic congestive heart failure (HCC) 06/22/2018   Elevated troponin 06/22/2018   CAD (coronary artery disease) 06/22/2018   Left bundle branch block 06/22/2018   History of iron deficiency anemia 06/22/2018   Acute on chronic heart failure (HCC) 06/22/2018   Chronic bilateral low back pain without sciatica 12/12/2015   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   Iron deficiency anemia 03/28/2015   Hyperlipidemia 10/12/2014    Current Outpatient Medications:    aspirin EC 81 MG tablet, TAKE 1 TABLET (81 MG TOTAL) BY MOUTH DAILY. NEEDS FOLLOW UP APPOINTMENT FOR MORE REFILLS, Disp: 30 tablet, Rfl: 0   atorvastatin (LIPITOR) 40 MG tablet, TAKE 1 TABLET (40 MG TOTAL) BY MOUTH DAILY. NEEDS FOLLOW UP APPOINTMENT FOR ANYMORE REFILLS, Disp: 90 tablet, Rfl: 0   carvedilol (COREG) 3.125 MG tablet, TAKE 1 TABLET (3.125 MG TOTAL) BY MOUTH TWO (TWO) TIMES DAILY. NEEDS APPOINTMENT FOR MORE REFILLS, Disp: 180 tablet, Rfl: 1   dapagliflozin propanediol (FARXIGA) 10  MG TABS tablet, TAKE 1 TABLET (10 MG TOTAL) BY MOUTH DAILY WITH BREAKFAST. NEEDS FOLLOW UP APPOINTMENT FOR ANYMORE REFILLS, Disp: 90 tablet, Rfl: 0   digoxin (LANOXIN) 0.125 MG tablet, TAKE ONE TABLET BY MOUTH DAILY, Disp: 30 tablet, Rfl: 3   ENTRESTO 24-26 MG, TAKE ONE TABLET BY MOUTH TWICE A DAY, Disp: 60 tablet, Rfl: 10   FEROSUL 325 (65 Fe) MG tablet, TAKE 1 TABLET (325 MG TOTAL) BY MOUTH TWO (TWO) TIMES DAILY, Disp: 30 tablet, Rfl: 1   furosemide (LASIX) 40 MG tablet,  TAKE 1 TABLET (40 MG TOTAL) BY MOUTH DAILY, Disp: 90 tablet, Rfl: 0   potassium chloride SA (KLOR-CON M) 20 MEQ tablet, TAKE ONE TABLET BY MOUTH DAILY, Disp: 90 tablet, Rfl: 0   spironolactone (ALDACTONE) 25 MG tablet, Take 1 tablet (25 mg total) by mouth at bedtime., Disp: 30 tablet, Rfl: 11 No Known Allergies   Social History   Socioeconomic History   Marital status: Single    Spouse name: Not on file   Number of children: Not on file   Years of education: Not on file   Highest education level: Not on file  Occupational History   Not on file  Tobacco Use   Smoking status: Former    Current packs/day: 0.50    Types: Cigarettes   Smokeless tobacco: Never  Vaping Use   Vaping status: Never Used  Substance and Sexual Activity   Alcohol use: Yes    Comment: daily   Drug use: Yes    Types: Marijuana   Sexual activity: Not on file  Other Topics Concern   Not on file  Social History Narrative   Not on file   Social Drivers of Health   Financial Resource Strain: Not on file  Food Insecurity: Food Insecurity Present (10/17/2021)   Hunger Vital Sign    Worried About Running Out of Food in the Last Year: Often true    Ran Out of Food in the Last Year: Sometimes true  Transportation Needs: Unmet Transportation Needs (04/22/2023)   PRAPARE - Administrator, Civil Service (Medical): Yes    Lack of Transportation (Non-Medical): Yes  Physical Activity: Not on file  Stress: Not on file  Social Connections: Not on file  Intimate Partner Violence: Not on file    Physical Exam      Future Appointments  Date Time Provider Department Center  06/25/2023  2:10 PM Burna Forts, MD [facility]  09/18/2023  1:15 PM [facility]  12/18/2023  1:15 PM [facility]  03/18/2024  1:15 PM [facility]

## 2023-06-25 ENCOUNTER — Ambulatory Visit: Payer: 59 | Admitting: Family Medicine

## 2023-06-25 NOTE — Progress Notes (Deleted)
 CHIEF COMPLAINT: No chief complaint on file.  _____________________________________________________________ SUBJECTIVE  HPI  Pt is a 68 y.o. male here for evaluation of ***thigh  mass  Last seen in clinic 01/2023: 1 year of left thigh swelling. He initially noticed it after undergoing some cardiac surgery. The mass has gotten smaller at times but always returns. It is nonpainful. He has no knee pain. No trauma.  MRI w/wo contrast ordered at that time, pt did not schedule with imaging facility in time before the order expired. Presents today for f/u evaluation and MRI order  Since last visit, has ***continued to notice it. *** getting bigger  Ongoing for *** Inciting event: Primarily located   Radiating Numbness/tingling Catching/locking *** Exacerbated by Therapies tried so far:  Works as Psychiatric Nurse***   ------------------------------------------------------------------------------------------------------ Past Medical History:  Diagnosis Date   CHF (congestive heart failure) (HCC)    Cirrhosis (HCC)    Coronary artery disease    Hypertension    Pancreatitis     Past Surgical History:  Procedure Laterality Date   BIV ICD INSERTION CRT-D N/A 05/28/2022   Procedure: BIV ICD INSERTION CRT-D;  Surgeon: Waddell Danelle ORN, MD;  Location: [facility] INVASIVE CV LAB;  Service: Cardiovascular;  Laterality: N/A;   CARDIAC SURGERY     RIGHT/LEFT HEART CATH AND CORONARY/GRAFT ANGIOGRAPHY N/A 06/24/2018   Procedure: RIGHT/LEFT HEART CATH AND CORONARY/GRAFT ANGIOGRAPHY;  Surgeon: Cherrie Toribio SAUNDERS, MD;  Location: [facility] INVASIVE CV LAB;  Service: Cardiovascular;  Laterality: N/A;      Outpatient Encounter Medications as of 06/25/2023  Medication Sig   aspirin  EC 81 MG tablet TAKE 1 TABLET (81 MG TOTAL) BY MOUTH DAILY. NEEDS FOLLOW UP APPOINTMENT FOR MORE REFILLS   atorvastatin  (LIPITOR) 40 MG tablet TAKE 1 TABLET (40 MG TOTAL) BY MOUTH DAILY. NEEDS FOLLOW UP APPOINTMENT FOR ANYMORE REFILLS   carvedilol   (COREG ) 3.125 MG tablet TAKE 1 TABLET (3.125 MG TOTAL) BY MOUTH TWO (TWO) TIMES DAILY. NEEDS APPOINTMENT FOR MORE REFILLS   dapagliflozin  propanediol (FARXIGA ) 10 MG TABS tablet TAKE 1 TABLET (10 MG TOTAL) BY MOUTH DAILY WITH BREAKFAST. NEEDS FOLLOW UP APPOINTMENT FOR ANYMORE REFILLS   digoxin  (LANOXIN ) 0.125 MG tablet TAKE ONE TABLET BY MOUTH DAILY   ENTRESTO  24-26 MG TAKE ONE TABLET BY MOUTH TWICE A DAY   FEROSUL 325 (65 Fe) MG tablet TAKE 1 TABLET (325 MG TOTAL) BY MOUTH TWO (TWO) TIMES DAILY   furosemide  (LASIX ) 40 MG tablet TAKE 1 TABLET (40 MG TOTAL) BY MOUTH DAILY   potassium chloride  SA (KLOR-CON  M) 20 MEQ tablet TAKE ONE TABLET BY MOUTH DAILY   spironolactone  (ALDACTONE ) 25 MG tablet Take 1 tablet (25 mg total) by mouth at bedtime.   No facility-administered encounter medications on file as of 06/25/2023.    ------------------------------------------------------------------------------------------------------  _____________________________________________________________ OBJECTIVE  PHYSICAL EXAM  There were no vitals filed for this visit. There is no height or weight on file to calculate BMI.   reviewed  General: A+Ox3, no acute distress, well-nourished, appropriate affect CV: pulses 2+ regular, nondiaphoretic, no peripheral edema, cap refill <2sec Lungs: no audible wheezing, non-labored breathing, bilateral chest rise/fall, nontachypneic Skin: warm, well-perfused, non-icteric, no susp lesions or rashes Neuro:  Sensation intact, muscle tone wnl, no atrophy Psych: no signs of depression or anxiety MSK: ***      _____________________________________________________________ ASSESSMENT/PLAN There are no diagnoses linked to this encounter.   Limited POCUS MRI   Electronically signed by: Benedict ORN Bumps, MD 06/25/2023 7:47 AM

## 2023-06-26 ENCOUNTER — Other Ambulatory Visit (HOSPITAL_COMMUNITY): Payer: Self-pay | Admitting: Internal Medicine

## 2023-06-26 ENCOUNTER — Other Ambulatory Visit (HOSPITAL_COMMUNITY): Payer: Self-pay | Admitting: Family Medicine

## 2023-06-27 ENCOUNTER — Other Ambulatory Visit (HOSPITAL_COMMUNITY): Payer: Self-pay | Admitting: Emergency Medicine

## 2023-06-27 NOTE — Progress Notes (Signed)
 Paramedicine Encounter    Patient ID: Jeremy Powers, male    DOB: 28-Feb-1956, 68 y.o.   MRN: [id number]   Complaints NONE  Assessment A&O x 4, skin W&D w/ good color.  Denies chest pain or SOB.  No peripheral edema noted.  Compliance with meds: has gotten of track again  Pill box filled x 1 week  Refills needed  Meds changes since last visit NONE    Social changes NONE   BP (!) 160/70 (BP Location: Left Arm, Patient Position: Sitting, Cuff Size: Normal)   Pulse 70   Resp 16   Wt 124 lb 9.6 oz (56.5 kg)   SpO2 97%   BMI 18.95 kg/m  Weight yesterday-not taken Last visit weight-127lb  Today's home visit finds Jeremy Powers reporting to feeling good.  Once again he has gotten off track with his meds.  His family advised that he has been over indulging in alcohol and he acknowledges same.  Discussed the importance of abstaining from alcohol as much as he can.   Jeremy Powers denies chest pain or SOB.  Lung sounds clear bilat and no peripheral edema noted.  BP was elevated this morning but pt had not eaten yet or taken his morning meds.  He advised he will do so after our visit. Med box reconciled x 1 week.  Next home visit 2/11 @ 9:15.  ACTION: Home visit completed  Jeremy Powers [phone] 06/27/23  Patient Care Team: Dorina Dallas RIGGERS as PCP - General (Internal Medicine) Cherrie Toribio SAUNDERS, MD as PCP - Cardiology (Cardiology)  Patient Active Problem List   Diagnosis Date Noted   Lipoma 12/18/2022   Biventricular ICD (implantable cardioverter-defibrillator) in place 09/04/2022   NICM (nonischemic cardiomyopathy) (HCC) 03/20/2022   Malnutrition of moderate degree 09/23/2021   Dyslipidemia 09/21/2021   Anemia 09/21/2021   AKI (acute kidney injury) (HCC) 09/21/2021   Essential hypertension 09/20/2021   CHF (congestive heart failure) (HCC) 05/08/2021   Acute on chronic systolic CHF (congestive heart failure) (HCC) 05/06/2021   Mitral regurgitation  12/26/2020   COVID-19 virus infection 12/24/2020   CHF exacerbation (HCC) 12/23/2020   Acute on chronic systolic HF (heart failure) (HCC) 06/24/2018   Aortic insufficiency 06/22/2018   Acute on chronic congestive heart failure (HCC) 06/22/2018   Elevated troponin 06/22/2018   CAD (coronary artery disease) 06/22/2018   Left bundle branch block 06/22/2018   History of iron  deficiency anemia 06/22/2018   Acute on chronic heart failure (HCC) 06/22/2018   Chronic bilateral low back pain without sciatica 12/12/2015   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   Iron  deficiency anemia 03/28/2015   Hyperlipidemia 10/12/2014    Current Outpatient Medications:    aspirin  EC 81 MG tablet, TAKE 1 TABLET (81 MG TOTAL) BY MOUTH DAILY. NEEDS FOLLOW UP APPOINTMENT FOR MORE REFILLS, Disp: 30 tablet, Rfl: 0   atorvastatin  (LIPITOR) 40 MG tablet, TAKE 1 TABLET (40 MG TOTAL) BY MOUTH DAILY. NEEDS FOLLOW UP APPOINTMENT FOR ANYMORE REFILLS, Disp: 90 tablet, Rfl: 0   carvedilol  (COREG ) 3.125 MG tablet, TAKE 1 TABLET (3.125 MG TOTAL) BY MOUTH TWO (TWO) TIMES DAILY. NEEDS APPOINTMENT FOR MORE REFILLS, Disp: 180 tablet, Rfl: 1   dapagliflozin  propanediol (FARXIGA ) 10 MG TABS tablet, TAKE 1 TABLET (10 MG TOTAL) BY MOUTH DAILY WITH BREAKFAST. NEEDS FOLLOW UP APPOINTMENT FOR ANYMORE REFILLS, Disp: 90 tablet, Rfl: 0   digoxin  (LANOXIN ) 0.125 MG tablet, TAKE ONE TABLET BY MOUTH DAILY, Disp: 30 tablet, Rfl: 3  ENTRESTO  24-26 MG, TAKE ONE TABLET BY MOUTH TWICE A DAY, Disp: 60 tablet, Rfl: 10   FEROSUL 325 (65 Fe) MG tablet, TAKE 1 TABLET (325 MG TOTAL) BY MOUTH TWO (TWO) TIMES DAILY, Disp: 30 tablet, Rfl: 1   furosemide  (LASIX ) 40 MG tablet, TAKE 1 TABLET (40 MG TOTAL) BY MOUTH DAILY, Disp: 90 tablet, Rfl: 0   potassium chloride  SA (KLOR-CON  M) 20 MEQ tablet, TAKE ONE TABLET BY MOUTH DAILY, Disp: 90 tablet, Rfl: 0   spironolactone  (ALDACTONE ) 25 MG tablet, Take 1 tablet (25 mg total) by mouth at bedtime., Disp: 30  tablet, Rfl: 11 No Known Allergies   Social History   Socioeconomic History   Marital status: Single    Spouse name: Not on file   Number of children: Not on file   Years of education: Not on file   Highest education level: Not on file  Occupational History   Not on file  Tobacco Use   Smoking status: Former    Current packs/day: 0.50    Types: Cigarettes   Smokeless tobacco: Never  Vaping Use   Vaping status: Never Used  Substance and Sexual Activity   Alcohol use: Yes    Comment: daily   Drug use: Yes    Types: Marijuana   Sexual activity: Not on file  Other Topics Concern   Not on file  Social History Narrative   Not on file   Social Drivers of Health   Financial Resource Strain: Not on file  Food Insecurity: Food Insecurity Present (10/17/2021)   Hunger Vital Sign    Worried About Running Out of Food in the Last Year: Often true    Ran Out of Food in the Last Year: Sometimes true  Transportation Needs: Unmet Transportation Needs (04/22/2023)   PRAPARE - Administrator, Civil Service (Medical): Yes    Lack of Transportation (Non-Medical): Yes  Physical Activity: Not on file  Stress: Not on file  Social Connections: Not on file  Intimate Partner Violence: Not on file    Physical Exam      Future Appointments  Date Time Provider Department Center  09/18/2023  1:15 PM [facility]  12/18/2023  1:15 PM [facility]  03/18/2024  1:15 PM [facility]

## 2023-07-02 ENCOUNTER — Other Ambulatory Visit (HOSPITAL_COMMUNITY): Payer: Self-pay | Admitting: Emergency Medicine

## 2023-07-02 NOTE — Progress Notes (Signed)
Paramedicine Encounter    Patient ID: Jeremy Powers, male    DOB: 05/13/1956, 68 y.o.   MRN: [id number]   Complaints Dark Stool yesterday.    Assessment A&O x 4, skin W&D w/ good color.  Pt. Denies chest pain or SOB.  No edema noted and lung sounds clear throughout.  Pt does state he had 1 episode of "black stool" 07/01/23.  He denies abdominal pain and no nausea or vomiting.  Abdomen non-tender to palpation and no rigidity noted.  Advised pt to write down if this happens again and I will send message to his PCP concerning same.    Compliance with meds YES  Pill box filled x 1 week  Refills needed ASA,   Meds changes since last visit NONE    Social changes NONE   BP (!) 150/78 (BP Location: Right Arm, Patient Position: Sitting, Cuff Size: Normal)   Pulse 88   Wt 124 lb 12.8 oz (56.6 kg)   BMI 18.98 kg/m  Weight yesterday- not taking Last visit weight-124lb  ACTION: Home visit completed  Bethanie Dicker [phone] 07/02/23  Patient Care Team: [name] as PCP - General (Internal Medicine) [name] Bevelyn Buckles, MD as PCP - Cardiology (Cardiology)  Patient Active Problem List   Diagnosis Date Noted   Lipoma 12/18/2022   Biventricular ICD (implantable cardioverter-defibrillator) in place 09/04/2022   NICM (nonischemic cardiomyopathy) (HCC) 03/20/2022   Malnutrition of moderate degree 09/23/2021   Dyslipidemia 09/21/2021   Anemia 09/21/2021   AKI (acute kidney injury) (HCC) 09/21/2021   Essential hypertension 09/20/2021   CHF (congestive heart failure) (HCC) 05/08/2021   Acute on chronic systolic CHF (congestive heart failure) (HCC) 05/06/2021   Mitral regurgitation 12/26/2020   COVID-19 virus infection 12/24/2020   CHF exacerbation (HCC) 12/23/2020   Acute on chronic systolic HF (heart failure) (HCC) 06/24/2018   Aortic insufficiency 06/22/2018   Acute on chronic congestive heart failure (HCC) 06/22/2018   Elevated troponin 06/22/2018   CAD  (coronary artery disease) 06/22/2018   Left bundle branch block 06/22/2018   History of iron deficiency anemia 06/22/2018   Acute on chronic heart failure (HCC) 06/22/2018   Chronic bilateral low back pain without sciatica 12/12/2015   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   Iron deficiency anemia 03/28/2015   Hyperlipidemia 10/12/2014    Current Outpatient Medications:    aspirin EC 81 MG tablet, TAKE 1 TABLET (81 MG TOTAL) BY MOUTH DAILY. NEEDS FOLLOW UP APPOINTMENT FOR MORE REFILLS, Disp: 30 tablet, Rfl: 0   atorvastatin (LIPITOR) 40 MG tablet, TAKE 1 TABLET (40 MG TOTAL) BY MOUTH DAILY. NEEDS FOLLOW UP APPOINTMENT FOR ANYMORE REFILLS, Disp: 90 tablet, Rfl: 0   carvedilol (COREG) 3.125 MG tablet, TAKE 1 TABLET (3.125 MG TOTAL) BY MOUTH TWO (TWO) TIMES DAILY. NEEDS APPOINTMENT FOR MORE REFILLS, Disp: 180 tablet, Rfl: 1   digoxin (LANOXIN) 0.125 MG tablet, TAKE ONE TABLET BY MOUTH DAILY, Disp: 30 tablet, Rfl: 0   FARXIGA 10 MG TABS tablet, TAKE 1 TABLET (10 MG TOTAL) BY MOUTH DAILY WITH BREAKFAST. NEEDS FOLLOW UP APPOINTMENT FOR ANYMORE REFILLS, Disp: 90 tablet, Rfl: 1   FEROSUL 325 (65 Fe) MG tablet, TAKE 1 TABLET (325 MG TOTAL) BY MOUTH TWO (TWO) TIMES DAILY, Disp: 30 tablet, Rfl: 1   furosemide (LASIX) 40 MG tablet, TAKE 1 TABLET (40 MG TOTAL) BY MOUTH DAILY, Disp: 90 tablet, Rfl: 0   potassium chloride SA (KLOR-CON M) 20 MEQ tablet, TAKE ONE TABLET BY MOUTH  DAILY, Disp: 90 tablet, Rfl: 0   sacubitril-valsartan (ENTRESTO) 24-26 MG, TAKE ONE TABLET BY MOUTH TWICE A DAY, Disp: 60 tablet, Rfl: 2   spironolactone (ALDACTONE) 25 MG tablet, TAKE ONE TABLET BY MOUTH AT BEDTIME, Disp: 30 tablet, Rfl: 2 No Known Allergies   Social History   Socioeconomic History   Marital status: Single    Spouse name: Not on file   Number of children: Not on file   Years of education: Not on file   Highest education level: Not on file  Occupational History   Not on file  Tobacco Use    Smoking status: Former    Current packs/day: 0.50    Types: Cigarettes   Smokeless tobacco: Never  Vaping Use   Vaping status: Never Used  Substance and Sexual Activity   Alcohol use: Yes    Comment: daily   Drug use: Yes    Types: Marijuana   Sexual activity: Not on file  Other Topics Concern   Not on file  Social History Narrative   Not on file   Social Drivers of Health   Financial Resource Strain: Not on file  Food Insecurity: Food Insecurity Present (10/17/2021)   Hunger Vital Sign    Worried About Running Out of Food in the Last Year: Often true    Ran Out of Food in the Last Year: Sometimes true  Transportation Needs: Unmet Transportation Needs (04/22/2023)   PRAPARE - Administrator, Civil Service (Medical): Yes    Lack of Transportation (Non-Medical): Yes  Physical Activity: Not on file  Stress: Not on file  Social Connections: Not on file  Intimate Partner Violence: Not on file    Physical Exam      Future Appointments  Date Time Provider Department Center  09/18/2023  1:15 PM [facility]  12/18/2023  1:15 PM [facility]  03/18/2024  1:15 PM [facility]

## 2023-07-03 ENCOUNTER — Telehealth: Payer: Self-pay | Admitting: *Deleted

## 2023-07-03 NOTE — Telephone Encounter (Signed)
-----   Message from Jeremy Powers sent at 07/02/2023  7:35 PM EST ----- Regarding: FW: "black stool" Can we get pt to follow up in office to get labs to check for anemia and evaluate for possible blood in stools. ----- Message ----- From: Jeremy Powers, Paramedic Sent: 07/02/2023   7:28 PM EST To: [name], PA-C Subject: "black stool"                                  I am a community health paramedic and I see Jeremy Powers to assist him with his medications and doctor visits.  During today's visit Jeremy Powers advised me that he had a "black bowel movement"  yesterday (07/01/23).  He states this has only happened once and that today's stool looked normal.  He denies any abdominal pain and has no noted distention or rigidity. No bright red in his stool and no constipation.   I recommended that he keep watch w/ his stools and I told him I would let you know incase you have further instructions for him.  Thank you for your time,    Jeremy Powers, Jeremy Powers [phone] 07/02/2023

## 2023-07-04 NOTE — Telephone Encounter (Signed)
Pt is on iron supplements

## 2023-07-09 ENCOUNTER — Other Ambulatory Visit (HOSPITAL_COMMUNITY): Payer: Self-pay | Admitting: Emergency Medicine

## 2023-07-09 NOTE — Progress Notes (Signed)
Paramedicine Encounter    Patient ID: Jeremy Powers, male    DOB: 12-Dec-1955, 68 y.o.   MRN: [id number]   Complaints NONE  Assessment A&O x 4, skin W&D w/ good color.  Denies chest pain or SOB.  Lung sounds clear and equal bilat.  No peripheral edema noted  Compliance with meds YES  Pill box filled x 2 weeks  Refills needed Aspirin  Meds changes since last visit NONE    Social changes NONE   BP 130/68 (BP Location: Left Arm, Patient Position: Sitting, Cuff Size: Normal)   Pulse 67   Resp 16   Wt 128 lb 9.6 oz (58.3 kg)   SpO2 96%   BMI 19.55 kg/m  Weight yesterday-not taken Last visit weight-124.8  ATF Mr. Lyon A&O x 4, skin W&D w/ good color.  Pt denies chest pain or SOB.  Lung sounds clear and equal bilat. No peripheral edema noted.  He does have a non-productive cough that he says comes and goes.  He says he does cough more at night.  He is sleeping on his back and side and states this positioning does not cause him SOB and he only needs one pillow under his head. Pill box reconciled x 2 weeks.  No pending appointments.  Next home visit will be 2/25 @ 10:00  ACTION: Home visit completed  Bethanie Dicker [phone] 07/09/23  Patient Care Team: [name] as PCP - General (Internal Medicine) [name] Bevelyn Buckles, MD as PCP - Cardiology (Cardiology)  Patient Active Problem List   Diagnosis Date Noted   Lipoma 12/18/2022   Biventricular ICD (implantable cardioverter-defibrillator) in place 09/04/2022   NICM (nonischemic cardiomyopathy) (HCC) 03/20/2022   Malnutrition of moderate degree 09/23/2021   Dyslipidemia 09/21/2021   Anemia 09/21/2021   AKI (acute kidney injury) (HCC) 09/21/2021   Essential hypertension 09/20/2021   CHF (congestive heart failure) (HCC) 05/08/2021   Acute on chronic systolic CHF (congestive heart failure) (HCC) 05/06/2021   Mitral regurgitation 12/26/2020   COVID-19 virus infection 12/24/2020   CHF exacerbation  (HCC) 12/23/2020   Acute on chronic systolic HF (heart failure) (HCC) 06/24/2018   Aortic insufficiency 06/22/2018   Acute on chronic congestive heart failure (HCC) 06/22/2018   Elevated troponin 06/22/2018   CAD (coronary artery disease) 06/22/2018   Left bundle branch block 06/22/2018   History of iron deficiency anemia 06/22/2018   Acute on chronic heart failure (HCC) 06/22/2018   Chronic bilateral low back pain without sciatica 12/12/2015   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   Iron deficiency anemia 03/28/2015   Hyperlipidemia 10/12/2014    Current Outpatient Medications:    atorvastatin (LIPITOR) 40 MG tablet, TAKE 1 TABLET (40 MG TOTAL) BY MOUTH DAILY. NEEDS FOLLOW UP APPOINTMENT FOR ANYMORE REFILLS, Disp: 90 tablet, Rfl: 0   carvedilol (COREG) 3.125 MG tablet, TAKE 1 TABLET (3.125 MG TOTAL) BY MOUTH TWO (TWO) TIMES DAILY. NEEDS APPOINTMENT FOR MORE REFILLS, Disp: 180 tablet, Rfl: 1   digoxin (LANOXIN) 0.125 MG tablet, TAKE ONE TABLET BY MOUTH DAILY, Disp: 30 tablet, Rfl: 0   FARXIGA 10 MG TABS tablet, TAKE 1 TABLET (10 MG TOTAL) BY MOUTH DAILY WITH BREAKFAST. NEEDS FOLLOW UP APPOINTMENT FOR ANYMORE REFILLS, Disp: 90 tablet, Rfl: 1   FEROSUL 325 (65 Fe) MG tablet, TAKE 1 TABLET (325 MG TOTAL) BY MOUTH TWO (TWO) TIMES DAILY, Disp: 30 tablet, Rfl: 1   furosemide (LASIX) 40 MG tablet, TAKE 1 TABLET (40 MG TOTAL) BY MOUTH DAILY,  Disp: 90 tablet, Rfl: 0   potassium chloride SA (KLOR-CON M) 20 MEQ tablet, TAKE ONE TABLET BY MOUTH DAILY, Disp: 90 tablet, Rfl: 0   sacubitril-valsartan (ENTRESTO) 24-26 MG, TAKE ONE TABLET BY MOUTH TWICE A DAY, Disp: 60 tablet, Rfl: 2   aspirin EC 81 MG tablet, TAKE 1 TABLET (81 MG TOTAL) BY MOUTH DAILY. NEEDS FOLLOW UP APPOINTMENT FOR MORE REFILLS (Patient not taking: Reported on 07/09/2023), Disp: 30 tablet, Rfl: 0   spironolactone (ALDACTONE) 25 MG tablet, TAKE ONE TABLET BY MOUTH AT BEDTIME, Disp: 30 tablet, Rfl: 2 No Known  Allergies   Social History   Socioeconomic History   Marital status: Single    Spouse name: Not on file   Number of children: Not on file   Years of education: Not on file   Highest education level: Not on file  Occupational History   Not on file  Tobacco Use   Smoking status: Former    Current packs/day: 0.50    Types: Cigarettes   Smokeless tobacco: Never  Vaping Use   Vaping status: Never Used  Substance and Sexual Activity   Alcohol use: Yes    Comment: daily   Drug use: Yes    Types: Marijuana   Sexual activity: Not on file  Other Topics Concern   Not on file  Social History Narrative   Not on file   Social Drivers of Health   Financial Resource Strain: Not on file  Food Insecurity: Food Insecurity Present (10/17/2021)   Hunger Vital Sign    Worried About Running Out of Food in the Last Year: Often true    Ran Out of Food in the Last Year: Sometimes true  Transportation Needs: Unmet Transportation Needs (04/22/2023)   PRAPARE - Administrator, Civil Service (Medical): Yes    Lack of Transportation (Non-Medical): Yes  Physical Activity: Not on file  Stress: Not on file  Social Connections: Not on file  Intimate Partner Violence: Not on file    Physical Exam      Future Appointments  Date Time Provider Department Center  09/18/2023  1:15 PM [facility]  12/18/2023  1:15 PM [facility]  03/18/2024  1:15 PM [facility]

## 2023-07-16 ENCOUNTER — Other Ambulatory Visit (HOSPITAL_COMMUNITY): Payer: Self-pay | Admitting: Emergency Medicine

## 2023-07-16 NOTE — Progress Notes (Signed)
 Paramedicine Encounter    Patient ID: Jeremy Powers, male    DOB: [birth year], 68 y.o.   MRN: [id number]   Complaints NONE  Assessment A&O x 4, skin W&D w/ good color.  Denies chest pain or SOB.  Lung sounds clear and equal bilat.  No peripheral edema noted.  Compliance with meds missed 1 p.m. dose out of a week  Pill box filled n/a  as a week of meds left.  Refills needed NONE  Meds changes since last visit NONE    Social changes NONE   BP 130/60 (BP Location: Left Arm, Patient Position: Sitting, Cuff Size: Normal)   Pulse 64   Resp 14   Wt 132 lb 3.2 oz (60 kg)   BMI 20.10 kg/m  Weight yesterday- not taken Last visit weight-128lb  ATF [name] A&O x 4, skin W&D w/ good color.  Pt denies chest pain or SOB.  Lung sounds clear bilat and no peripheral edema noted.  Pt has done well w/ med compliance and only missed on pm dose.  Pt's med box filled x 2 weeks last week so no reconciliation today due to having a week remaining.     He states that since the weather's been nice the past couple of days he's been walking.  He advises he has had no SOB w/ exertion. Next home visit scheduled 07/23/23 @ 9:30  ACTION: Home visit completed  Bethanie Dicker [phone] 07/16/23  Patient Care Team: [name] as PCP - General (Internal Medicine) [name] Bevelyn Buckles, MD as PCP - Cardiology (Cardiology)  Patient Active Problem List   Diagnosis Date Noted   Lipoma 12/18/2022   Biventricular ICD (implantable cardioverter-defibrillator) in place 09/04/2022   NICM (nonischemic cardiomyopathy) (HCC) 03/20/2022   Malnutrition of moderate degree 09/23/2021   Dyslipidemia 09/21/2021   Anemia 09/21/2021   AKI (acute kidney injury) (HCC) 09/21/2021   Essential hypertension 09/20/2021   CHF (congestive heart failure) (HCC) 05/08/2021   Acute on chronic systolic CHF (congestive heart failure) (HCC) 05/06/2021   Mitral regurgitation 12/26/2020   COVID-19 virus infection  12/24/2020   CHF exacerbation (HCC) 12/23/2020   Acute on chronic systolic HF (heart failure) (HCC) 06/24/2018   Aortic insufficiency 06/22/2018   Acute on chronic congestive heart failure (HCC) 06/22/2018   Elevated troponin 06/22/2018   CAD (coronary artery disease) 06/22/2018   Left bundle branch block 06/22/2018   History of iron deficiency anemia 06/22/2018   Acute on chronic heart failure (HCC) 06/22/2018   Chronic bilateral low back pain without sciatica 12/12/2015   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   Iron deficiency anemia 03/28/2015   Hyperlipidemia 10/12/2014    Current Outpatient Medications:    aspirin EC 81 MG tablet, TAKE 1 TABLET (81 MG TOTAL) BY MOUTH DAILY. NEEDS FOLLOW UP APPOINTMENT FOR MORE REFILLS (Patient not taking: Reported on 07/09/2023), Disp: 30 tablet, Rfl: 0   atorvastatin (LIPITOR) 40 MG tablet, TAKE 1 TABLET (40 MG TOTAL) BY MOUTH DAILY. NEEDS FOLLOW UP APPOINTMENT FOR ANYMORE REFILLS, Disp: 90 tablet, Rfl: 0   carvedilol (COREG) 3.125 MG tablet, TAKE 1 TABLET (3.125 MG TOTAL) BY MOUTH TWO (TWO) TIMES DAILY. NEEDS APPOINTMENT FOR MORE REFILLS, Disp: 180 tablet, Rfl: 1   digoxin (LANOXIN) 0.125 MG tablet, TAKE ONE TABLET BY MOUTH DAILY, Disp: 30 tablet, Rfl: 0   FARXIGA 10 MG TABS tablet, TAKE 1 TABLET (10 MG TOTAL) BY MOUTH DAILY WITH BREAKFAST. NEEDS FOLLOW UP APPOINTMENT FOR ANYMORE REFILLS, Disp:  90 tablet, Rfl: 1   FEROSUL 325 (65 Fe) MG tablet, TAKE 1 TABLET (325 MG TOTAL) BY MOUTH TWO (TWO) TIMES DAILY, Disp: 30 tablet, Rfl: 1   furosemide (LASIX) 40 MG tablet, TAKE 1 TABLET (40 MG TOTAL) BY MOUTH DAILY, Disp: 90 tablet, Rfl: 0   potassium chloride SA (KLOR-CON M) 20 MEQ tablet, TAKE ONE TABLET BY MOUTH DAILY, Disp: 90 tablet, Rfl: 0   sacubitril-valsartan (ENTRESTO) 24-26 MG, TAKE ONE TABLET BY MOUTH TWICE A DAY, Disp: 60 tablet, Rfl: 2   spironolactone (ALDACTONE) 25 MG tablet, TAKE ONE TABLET BY MOUTH AT BEDTIME, Disp: 30 tablet, Rfl:  2 No Known Allergies   Social History   Socioeconomic History   Marital status: Single    Spouse name: Not on file   Number of children: Not on file   Years of education: Not on file   Highest education level: Not on file  Occupational History   Not on file  Tobacco Use   Smoking status: Former    Current packs/day: 0.50    Types: Cigarettes   Smokeless tobacco: Never  Vaping Use   Vaping status: Never Used  Substance and Sexual Activity   Alcohol use: Yes    Comment: daily   Drug use: Yes    Types: Marijuana   Sexual activity: Not on file  Other Topics Concern   Not on file  Social History Narrative   Not on file   Social Drivers of Health   Financial Resource Strain: Not on file  Food Insecurity: Food Insecurity Present (10/17/2021)   Hunger Vital Sign    Worried About Running Out of Food in the Last Year: Often true    Ran Out of Food in the Last Year: Sometimes true  Transportation Needs: Unmet Transportation Needs (04/22/2023)   PRAPARE - Administrator, Civil Service (Medical): Yes    Lack of Transportation (Non-Medical): Yes  Physical Activity: Not on file  Stress: Not on file  Social Connections: Not on file  Intimate Partner Violence: Not on file    Physical Exam      Future Appointments  Date Time Provider Department Center  09/18/2023  1:15 PM [facility]  12/18/2023  1:15 PM [facility]  03/18/2024  1:15 PM [facility]

## 2023-07-24 ENCOUNTER — Other Ambulatory Visit (HOSPITAL_COMMUNITY): Payer: Self-pay | Admitting: Family Medicine

## 2023-07-24 ENCOUNTER — Other Ambulatory Visit (HOSPITAL_COMMUNITY): Payer: Self-pay | Admitting: Internal Medicine

## 2023-07-25 ENCOUNTER — Other Ambulatory Visit (HOSPITAL_COMMUNITY): Payer: Self-pay | Admitting: Emergency Medicine

## 2023-07-25 NOTE — Progress Notes (Signed)
 Paramedicine Encounter    Patient ID: Jeremy Powers, male    DOB: 1955/09/13, 68 y.o.   MRN: [id number]   Complaints NONE  Assessment A&O x 4, skin W&D w/ good color.  Denies chest pain or SOB.  Lung sounds clear bilat and no peripheral edema noted  Compliance with meds YES  Pill box filled x 2 week  Refills needed NONE  Meds changes since last visit NONE   Social changes NONE   BP (!) 140/70 (BP Location: Left Arm, Patient Position: Sitting, Cuff Size: Normal)   Pulse 62   Resp 16   Wt 129 lb 12.8 oz (58.9 kg)   SpO2 96%   BMI 19.74 kg/m  Weight yesterday- not taken Last visit weight-132lb  ATF Mr. Ohman A&O x 4, skin W&D w/ good color.  Pt. Denies chest pain or SOB.  Lung sounds clear bilat.  No peripheral edema noted.  Pill box reconciled x 2 weeks. Reviewed meds again and what they're for.  Provided pt w list of meds and daily doses.    ACTION: Home visit completed  Bethanie Dicker [phone] 07/25/23  Patient Care Team: [name] as PCP - General (Internal Medicine) [name] Bevelyn Buckles, MD as PCP - Cardiology (Cardiology)  Patient Active Problem List   Diagnosis Date Noted   Lipoma 12/18/2022   Biventricular ICD (implantable cardioverter-defibrillator) in place 09/04/2022   NICM (nonischemic cardiomyopathy) (HCC) 03/20/2022   Malnutrition of moderate degree 09/23/2021   Dyslipidemia 09/21/2021   Anemia 09/21/2021   AKI (acute kidney injury) (HCC) 09/21/2021   Essential hypertension 09/20/2021   CHF (congestive heart failure) (HCC) 05/08/2021   Acute on chronic systolic CHF (congestive heart failure) (HCC) 05/06/2021   Mitral regurgitation 12/26/2020   COVID-19 virus infection 12/24/2020   CHF exacerbation (HCC) 12/23/2020   Acute on chronic systolic HF (heart failure) (HCC) 06/24/2018   Aortic insufficiency 06/22/2018   Acute on chronic congestive heart failure (HCC) 06/22/2018   Elevated troponin 06/22/2018   CAD (coronary  artery disease) 06/22/2018   Left bundle branch block 06/22/2018   History of iron deficiency anemia 06/22/2018   Acute on chronic heart failure (HCC) 06/22/2018   Chronic bilateral low back pain without sciatica 12/12/2015   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   Iron deficiency anemia 03/28/2015   Hyperlipidemia 10/12/2014    Current Outpatient Medications:    aspirin EC 81 MG tablet, TAKE 1 TABLET (81 MG TOTAL) BY MOUTH DAILY. NEEDS FOLLOW UP APPOINTMENT FOR MORE REFILLS, Disp: 30 tablet, Rfl: 0   atorvastatin (LIPITOR) 40 MG tablet, TAKE 1 TABLET (40 MG TOTAL) BY MOUTH DAILY. NEEDS FOLLOW UP APPOINTMENT FOR ANYMORE REFILLS, Disp: 90 tablet, Rfl: 0   carvedilol (COREG) 3.125 MG tablet, TAKE 1 TABLET (3.125 MG TOTAL) BY MOUTH TWO (TWO) TIMES DAILY. NEEDS APPOINTMENT FOR MORE REFILLS, Disp: 180 tablet, Rfl: 1   digoxin (LANOXIN) 0.125 MG tablet, TAKE ONE TABLET BY MOUTH DAILY, Disp: 30 tablet, Rfl: 0   FARXIGA 10 MG TABS tablet, TAKE 1 TABLET (10 MG TOTAL) BY MOUTH DAILY WITH BREAKFAST. NEEDS FOLLOW UP APPOINTMENT FOR ANYMORE REFILLS, Disp: 90 tablet, Rfl: 1   FEROSUL 325 (65 Fe) MG tablet, TAKE 1 TABLET (325 MG TOTAL) BY MOUTH TWO (TWO) TIMES DAILY, Disp: 30 tablet, Rfl: 1   furosemide (LASIX) 40 MG tablet, TAKE 1 TABLET (40 MG TOTAL) BY MOUTH DAILY, Disp: 90 tablet, Rfl: 0   sacubitril-valsartan (ENTRESTO) 24-26 MG, TAKE ONE TABLET BY MOUTH  TWICE A DAY, Disp: 60 tablet, Rfl: 2   spironolactone (ALDACTONE) 25 MG tablet, TAKE ONE TABLET BY MOUTH AT BEDTIME, Disp: 30 tablet, Rfl: 2   potassium chloride SA (KLOR-CON M) 20 MEQ tablet, TAKE ONE TABLET BY MOUTH DAILY, Disp: 90 tablet, Rfl: 0 No Known Allergies   Social History   Socioeconomic History   Marital status: Single    Spouse name: Not on file   Number of children: Not on file   Years of education: Not on file   Highest education level: Not on file  Occupational History   Not on file  Tobacco Use   Smoking status:  Former    Current packs/day: 0.50    Types: Cigarettes   Smokeless tobacco: Never  Vaping Use   Vaping status: Never Used  Substance and Sexual Activity   Alcohol use: Yes    Comment: daily   Drug use: Yes    Types: Marijuana   Sexual activity: Not on file  Other Topics Concern   Not on file  Social History Narrative   Not on file   Social Drivers of Health   Financial Resource Strain: Not on file  Food Insecurity: Food Insecurity Present (10/17/2021)   Hunger Vital Sign    Worried About Running Out of Food in the Last Year: Often true    Ran Out of Food in the Last Year: Sometimes true  Transportation Needs: Unmet Transportation Needs (04/22/2023)   PRAPARE - Administrator, Civil Service (Medical): Yes    Lack of Transportation (Non-Medical): Yes  Physical Activity: Not on file  Stress: Not on file  Social Connections: Not on file  Intimate Partner Violence: Not on file    Physical Exam      Future Appointments  Date Time Provider Department Center  09/18/2023  1:15 PM [facility]  12/18/2023  1:15 PM [facility]  03/18/2024  1:15 PM [facility]

## 2023-07-29 NOTE — Progress Notes (Signed)
 Remote ICD transmission.

## 2023-08-06 ENCOUNTER — Other Ambulatory Visit (HOSPITAL_COMMUNITY): Payer: Self-pay | Admitting: Emergency Medicine

## 2023-08-06 NOTE — Progress Notes (Unsigned)
 Paramedicine Encounter    Patient ID: Jeremy Powers, male    DOB: [birth year], 68 y.o.   MRN: [id number]   Complaints NONE  Assessment A&O x 4, skin W&D w/ good color.  Denies chest pain or SOB.  Lung sounds clear and equal bilat and no peripheral edema noted.  Compliance with meds YES  Pill box filled x 2 weeks  Refills needed Iron  Meds changes since last visit NONE    Social changes NONE   BP (!) 160/70 (BP Location: Left Arm, Patient Position: Sitting, Cuff Size: Normal)   Pulse 74   Resp 16   Wt 129 lb 6.4 oz (58.7 kg)   BMI 19.68 kg/m  Weight yesterday- NONE Last visit weight-129lb  ATF Jeremy Powers A&O x 4, skin W&D w/ good color.  Pt denies chest pain or SOB.  Lung sounds clear throughout.  No peripheral edema noted.  Pill box reconciled x 1 week.     ACTION: Home visit completed  Jeremy Powers [phone] 08/06/23  Patient Care Team: Jeremy Powers (Internal Medicine) Jeremy Powers (Powers)  Patient Active Problem List   Diagnosis Date Noted   Lipoma 12/18/2022   Biventricular ICD (implantable cardioverter-defibrillator) in place 09/04/2022   NICM (nonischemic cardiomyopathy) (HCC) 03/20/2022   Malnutrition of moderate degree 09/23/2021   Dyslipidemia 09/21/2021   Anemia 09/21/2021   AKI (acute kidney injury) (HCC) 09/21/2021   Essential hypertension 09/20/2021   CHF (congestive heart failure) (HCC) 05/08/2021   Acute on chronic systolic CHF (congestive heart failure) (HCC) 05/06/2021   Mitral regurgitation 12/26/2020   COVID-19 virus infection 12/24/2020   CHF exacerbation (HCC) 12/23/2020   Acute on chronic systolic HF (heart failure) (HCC) 06/24/2018   Aortic insufficiency 06/22/2018   Acute on chronic congestive heart failure (HCC) 06/22/2018   Elevated troponin 06/22/2018   CAD (coronary artery disease) 06/22/2018   Left bundle branch block 06/22/2018   History of iron  deficiency anemia 06/22/2018   Acute on chronic heart failure (HCC) 06/22/2018   Chronic bilateral low back pain without sciatica 12/12/2015   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   Iron deficiency anemia 03/28/2015   Hyperlipidemia 10/12/2014    Current Outpatient Medications:    aspirin EC 81 MG tablet, TAKE 1 TABLET (81 MG TOTAL) BY MOUTH DAILY. NEEDS FOLLOW UP APPOINTMENT FOR MORE REFILLS, Disp: 30 tablet, Rfl: 0   atorvastatin (LIPITOR) 40 MG tablet, TAKE 1 TABLET (40 MG TOTAL) BY MOUTH DAILY. NEEDS FOLLOW UP APPOINTMENT FOR ANYMORE REFILLS, Disp: 90 tablet, Rfl: 0   carvedilol (COREG) 3.125 MG tablet, TAKE 1 TABLET (3.125 MG TOTAL) BY MOUTH TWO (TWO) TIMES DAILY. NEEDS APPOINTMENT FOR MORE REFILLS, Disp: 180 tablet, Rfl: 1   digoxin (LANOXIN) 0.125 MG tablet, TAKE ONE TABLET BY MOUTH DAILY, Disp: 90 tablet, Rfl: 3   FARXIGA 10 MG TABS tablet, TAKE 1 TABLET (10 MG TOTAL) BY MOUTH DAILY WITH BREAKFAST. NEEDS FOLLOW UP APPOINTMENT FOR ANYMORE REFILLS, Disp: 90 tablet, Rfl: 1   FEROSUL 325 (65 Fe) MG tablet, TAKE 1 TABLET (325 MG TOTAL) BY MOUTH TWO (TWO) TIMES DAILY, Disp: 30 tablet, Rfl: 1   furosemide (LASIX) 40 MG tablet, TAKE 1 TABLET (40 MG TOTAL) BY MOUTH DAILY, Disp: 90 tablet, Rfl: 3   potassium chloride SA (KLOR-CON M) 20 MEQ tablet, TAKE ONE TABLET BY MOUTH DAILY, Disp: 90 tablet, Rfl: 0   sacubitril-valsartan (ENTRESTO) 24-26 MG, TAKE ONE  TABLET BY MOUTH TWICE A DAY, Disp: 60 tablet, Rfl: 2   spironolactone (ALDACTONE) 25 MG tablet, TAKE ONE TABLET BY MOUTH AT BEDTIME, Disp: 30 tablet, Rfl: 2 No Known Allergies   Social History   Socioeconomic History   Marital status: Single    Spouse name: Not on file   Number of children: Not on file   Years of education: Not on file   Highest education level: Not on file  Occupational History   Not on file  Tobacco Use   Smoking status: Former    Current packs/day: 0.50    Types: Cigarettes   Smokeless tobacco: Never   Vaping Use   Vaping status: Never Used  Substance and Sexual Activity   Alcohol use: Yes    Comment: daily   Drug use: Yes    Types: Marijuana   Sexual activity: Not on file  Other Topics Concern   Not on file  Social History Narrative   Not on file   Social Drivers of Health   Financial Resource Strain: Not on file  Food Insecurity: Food Insecurity Present (10/17/2021)   Hunger Vital Sign    Worried About Running Out of Food in the Last Year: Often true    Ran Out of Food in the Last Year: Sometimes true  Transportation Needs: Unmet Transportation Needs (04/22/2023)   PRAPARE - Administrator, Civil Service (Medical): Yes    Lack of Transportation (Non-Medical): Yes  Physical Activity: Not on file  Stress: Not on file  Social Connections: Not on file  Intimate Partner Violence: Not on file    Physical Exam      Future Appointments  Date Time Provider Department Center  09/18/2023  1:15 PM [facility]  12/18/2023  1:15 PM [facility]  03/18/2024  1:15 PM [facility]

## 2023-08-20 ENCOUNTER — Other Ambulatory Visit (HOSPITAL_COMMUNITY): Payer: Self-pay | Admitting: Emergency Medicine

## 2023-08-20 NOTE — Progress Notes (Unsigned)
 Paramedicine Encounter    Patient ID: Jeremy Powers, male    DOB: [birth year], 68 y.o.   MRN: [id number]   Complaints Productive cough at night   Assessment A&O x 4, skin W&D w/ good color.  Denies chest pain or SOB.  Lung sounds clear and equal bilat. No peripheral edema noted.  Compliance with meds YES  Pill box filled x 2 weeks  Refills needed Ferosul 235mg   Meds changes since last visit NONE    Social changes NONE   BP (!) 140/60 (BP Location: Left Arm, Patient Position: Sitting, Cuff Size: Normal)   Pulse 64   Resp 16   Wt 132 lb (59.9 kg)   BMI 20.07 kg/m  Weight yesterday- not taken Last visit weight-129lb  Today's visit finds Jeremy Powers stating he has noticed a cough especially at night and it's slightly productive.  He thinks it may be related to the pollen outside as he's been walking a lot.  He denies chest pain or SOB.  No peripheral edema noted.  Med box reconciled x 2 weeks.  Provided Mr. Merfeld a list of OTC meds that are safe to take for pt's w/ heart failure.  Suggested he may want to reach out to his PCP or urgent care should could intensify or if he should develop a fever.  Next home visit 4/15 @ 9:00.  ACTION: Home visit completed  Bethanie Dicker [phone] 08/20/23  Patient Care Team: [name] as PCP - General (Internal Medicine) [name] Bevelyn Buckles, MD as PCP - Cardiology (Cardiology)  Patient Active Problem List   Diagnosis Date Noted   Lipoma 12/18/2022   Biventricular ICD (implantable cardioverter-defibrillator) in place 09/04/2022   NICM (nonischemic cardiomyopathy) (HCC) 03/20/2022   Malnutrition of moderate degree 09/23/2021   Dyslipidemia 09/21/2021   Anemia 09/21/2021   AKI (acute kidney injury) (HCC) 09/21/2021   Essential hypertension 09/20/2021   CHF (congestive heart failure) (HCC) 05/08/2021   Acute on chronic systolic CHF (congestive heart failure) (HCC) 05/06/2021   Mitral regurgitation 12/26/2020    COVID-19 virus infection 12/24/2020   CHF exacerbation (HCC) 12/23/2020   Acute on chronic systolic HF (heart failure) (HCC) 06/24/2018   Aortic insufficiency 06/22/2018   Acute on chronic congestive heart failure (HCC) 06/22/2018   Elevated troponin 06/22/2018   CAD (coronary artery disease) 06/22/2018   Left bundle branch block 06/22/2018   History of iron deficiency anemia 06/22/2018   Acute on chronic heart failure (HCC) 06/22/2018   Chronic bilateral low back pain without sciatica 12/12/2015   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   Iron deficiency anemia 03/28/2015   Hyperlipidemia 10/12/2014    Current Outpatient Medications:    aspirin EC 81 MG tablet, TAKE 1 TABLET (81 MG TOTAL) BY MOUTH DAILY. NEEDS FOLLOW UP APPOINTMENT FOR MORE REFILLS, Disp: 30 tablet, Rfl: 0   atorvastatin (LIPITOR) 40 MG tablet, TAKE 1 TABLET (40 MG TOTAL) BY MOUTH DAILY. NEEDS FOLLOW UP APPOINTMENT FOR ANYMORE REFILLS, Disp: 90 tablet, Rfl: 0   carvedilol (COREG) 3.125 MG tablet, TAKE 1 TABLET (3.125 MG TOTAL) BY MOUTH TWO (TWO) TIMES DAILY. NEEDS APPOINTMENT FOR MORE REFILLS, Disp: 180 tablet, Rfl: 1   digoxin (LANOXIN) 0.125 MG tablet, TAKE ONE TABLET BY MOUTH DAILY, Disp: 90 tablet, Rfl: 3   FARXIGA 10 MG TABS tablet, TAKE 1 TABLET (10 MG TOTAL) BY MOUTH DAILY WITH BREAKFAST. NEEDS FOLLOW UP APPOINTMENT FOR ANYMORE REFILLS, Disp: 90 tablet, Rfl: 1   furosemide (LASIX) 40 MG  tablet, TAKE 1 TABLET (40 MG TOTAL) BY MOUTH DAILY, Disp: 90 tablet, Rfl: 3   potassium chloride SA (KLOR-CON M) 20 MEQ tablet, TAKE ONE TABLET BY MOUTH DAILY, Disp: 90 tablet, Rfl: 0   sacubitril-valsartan (ENTRESTO) 24-26 MG, TAKE ONE TABLET BY MOUTH TWICE A DAY, Disp: 60 tablet, Rfl: 2   spironolactone (ALDACTONE) 25 MG tablet, TAKE ONE TABLET BY MOUTH AT BEDTIME, Disp: 30 tablet, Rfl: 2   FEROSUL 325 (65 Fe) MG tablet, TAKE 1 TABLET (325 MG TOTAL) BY MOUTH TWO (TWO) TIMES DAILY (Patient not taking: Reported on  08/20/2023), Disp: 30 tablet, Rfl: 1 No Known Allergies   Social History   Socioeconomic History   Marital status: Single    Spouse name: Not on file   Number of children: Not on file   Years of education: Not on file   Highest education level: Not on file  Occupational History   Not on file  Tobacco Use   Smoking status: Former    Current packs/day: 0.50    Types: Cigarettes   Smokeless tobacco: Never  Vaping Use   Vaping status: Never Used  Substance and Sexual Activity   Alcohol use: Yes    Comment: daily   Drug use: Yes    Types: Marijuana   Sexual activity: Not on file  Other Topics Concern   Not on file  Social History Narrative   Not on file   Social Drivers of Health   Financial Resource Strain: Not on file  Food Insecurity: Food Insecurity Present (10/17/2021)   Hunger Vital Sign    Worried About Running Out of Food in the Last Year: Often true    Ran Out of Food in the Last Year: Sometimes true  Transportation Needs: Unmet Transportation Needs (04/22/2023)   PRAPARE - Administrator, Civil Service (Medical): Yes    Lack of Transportation (Non-Medical): Yes  Physical Activity: Not on file  Stress: Not on file  Social Connections: Not on file  Intimate Partner Violence: Not on file    Physical Exam      Future Appointments  Date Time Provider Department Center  09/18/2023  1:15 PM [facility]  12/18/2023  1:15 PM [facility]  03/18/2024  1:15 PM [facility]

## 2023-09-02 ENCOUNTER — Other Ambulatory Visit (HOSPITAL_COMMUNITY): Payer: Self-pay | Admitting: Emergency Medicine

## 2023-09-02 NOTE — Progress Notes (Signed)
 Paramedicine Encounter    Patient ID: Jeremy Powers, male    DOB: 1956/01/08, 68 y.o.   MRN: [id number]   Complaints NONE  Assessment A&O x 4, skin W&D w/ good color.  Denies chest pain or SOB.  Lung sounds clear and equal bilat.  No peripheral edema noted.  Compliance with meds YES  Pill box filled x 2 weeks  Refills needed NONE  Meds changes since last visit NONE    Social changes NONE   Weight yesterday-not taken Last visit weight-132lb  Today's visit was med rec only.  Recent death in the family has normal routines disrupted. Pt has done great w/ med compliance.  Denies chest pain or SOB.  No edema noted.   Med box reconciled x 2 weeks.  [facility] is to be held Friday 4/18.  Next home visit will be 08/30/23 @ 9:30 [facility].  Transportation set up by [employer] will him up @ 8:45.    ACTION: Home visit completed  Carlton Chick [phone] 09/02/23  Patient Care Team: Francine Iron as PCP - General (Internal Medicine) Julane Ny Rheta Celestine, MD as PCP - Cardiology (Cardiology)  Patient Active Problem List   Diagnosis Date Noted   Lipoma 12/18/2022   Biventricular ICD (implantable cardioverter-defibrillator) in place 09/04/2022   NICM (nonischemic cardiomyopathy) (HCC) 03/20/2022   Malnutrition of moderate degree 09/23/2021   Dyslipidemia 09/21/2021   Anemia 09/21/2021   AKI (acute kidney injury) (HCC) 09/21/2021   Essential hypertension 09/20/2021   CHF (congestive heart failure) (HCC) 05/08/2021   Acute on chronic systolic CHF (congestive heart failure) (HCC) 05/06/2021   Mitral regurgitation 12/26/2020   COVID-19 virus infection 12/24/2020   CHF exacerbation (HCC) 12/23/2020   Acute on chronic systolic HF (heart failure) (HCC) 06/24/2018   Aortic insufficiency 06/22/2018   Acute on chronic congestive heart failure (HCC) 06/22/2018   Elevated troponin 06/22/2018   CAD (coronary artery disease) 06/22/2018   Left bundle branch  block 06/22/2018   History of iron deficiency anemia 06/22/2018   Acute on chronic heart failure (HCC) 06/22/2018   Chronic bilateral low back pain without sciatica 12/12/2015   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   Iron deficiency anemia 03/28/2015   Hyperlipidemia 10/12/2014    Current Outpatient Medications:    aspirin EC 81 MG tablet, TAKE 1 TABLET (81 MG TOTAL) BY MOUTH DAILY. NEEDS FOLLOW UP APPOINTMENT FOR MORE REFILLS, Disp: 30 tablet, Rfl: 0   atorvastatin (LIPITOR) 40 MG tablet, TAKE 1 TABLET (40 MG TOTAL) BY MOUTH DAILY. NEEDS FOLLOW UP APPOINTMENT FOR ANYMORE REFILLS, Disp: 90 tablet, Rfl: 0   carvedilol (COREG) 3.125 MG tablet, TAKE 1 TABLET (3.125 MG TOTAL) BY MOUTH TWO (TWO) TIMES DAILY. NEEDS APPOINTMENT FOR MORE REFILLS, Disp: 180 tablet, Rfl: 1   digoxin (LANOXIN) 0.125 MG tablet, TAKE ONE TABLET BY MOUTH DAILY, Disp: 90 tablet, Rfl: 3   FARXIGA 10 MG TABS tablet, TAKE 1 TABLET (10 MG TOTAL) BY MOUTH DAILY WITH BREAKFAST. NEEDS FOLLOW UP APPOINTMENT FOR ANYMORE REFILLS, Disp: 90 tablet, Rfl: 1   FEROSUL 325 (65 Fe) MG tablet, TAKE 1 TABLET (325 MG TOTAL) BY MOUTH TWO (TWO) TIMES DAILY, Disp: 30 tablet, Rfl: 1   furosemide (LASIX) 40 MG tablet, TAKE 1 TABLET (40 MG TOTAL) BY MOUTH DAILY, Disp: 90 tablet, Rfl: 3   potassium chloride SA (KLOR-CON M) 20 MEQ tablet, TAKE ONE TABLET BY MOUTH DAILY, Disp: 90 tablet, Rfl: 0   sacubitril-valsartan (ENTRESTO) 24-26  MG, TAKE ONE TABLET BY MOUTH TWICE A DAY, Disp: 60 tablet, Rfl: 2   spironolactone (ALDACTONE) 25 MG tablet, TAKE ONE TABLET BY MOUTH AT BEDTIME, Disp: 30 tablet, Rfl: 2 No Known Allergies   Social History   Socioeconomic History   Marital status: Single    Spouse name: Not on file   Number of children: Not on file   Years of education: Not on file   Highest education level: Not on file  Occupational History   Not on file  Tobacco Use   Smoking status: Former    Current packs/day: 0.50    Types:  Cigarettes   Smokeless tobacco: Never  Vaping Use   Vaping status: Never Used  Substance and Sexual Activity   Alcohol use: Yes    Comment: daily   Drug use: Yes    Types: Marijuana   Sexual activity: Not on file  Other Topics Concern   Not on file  Social History Narrative   Not on file   Social Drivers of Health   Financial Resource Strain: Not on file  Food Insecurity: Food Insecurity Present (10/17/2021)   Hunger Vital Sign    Worried About Running Out of Food in the Last Year: Often true    Ran Out of Food in the Last Year: Sometimes true  Transportation Needs: Unmet Transportation Needs (04/22/2023)   PRAPARE - Administrator, Civil Service (Medical): Yes    Lack of Transportation (Non-Medical): Yes  Physical Activity: Not on file  Stress: Not on file  Social Connections: Not on file  Intimate Partner Violence: Not on file    Physical Exam      Future Appointments  Date Time Provider Department Center  09/10/2023  9:30 AM [facility] PA/NP [facility] None  09/18/2023  1:15 PM [facility]  12/18/2023  1:15 PM [facility]  03/18/2024  1:15 PM [facility]

## 2023-09-04 ENCOUNTER — Telehealth (HOSPITAL_COMMUNITY): Payer: Self-pay | Admitting: Licensed Clinical Social Worker

## 2023-09-04 NOTE — Telephone Encounter (Signed)
 H&V Care Navigation CSW Progress Note  Clinical Social Worker consulted to help pt get to upcoming appt.  Pt without reliable phone so would not be able to coordinate reliably with insurance to get here.  CSW able to provide taxi ride to come to appt- pick up 8:45am on 4/22 for appt.   SDOH Screenings   Food Insecurity: Food Insecurity Present (10/17/2021)  Housing: Low Risk  (12/27/2020)  Transportation Needs: Unmet Transportation Needs (09/04/2023)  Depression (PHQ2-9): Low Risk  (01/14/2023)  Tobacco Use: Medium Risk (01/30/2023)   09/04/2023  Jeremy Powers DOB: [birth year] MRN: [id number]   RIDER WAIVER AND RELEASE OF LIABILITY  For the purposes of helping with transportation needs, [facility] partners with outside transportation providers (taxi companies, [employer], Cater[employer].) to give [facility] patients or other approved people the choice of on-demand rides [employer]") to our buildings for non-emergency visits.  By using [employer], I, the person signing this document, on behalf of myself and/or any legal minors (in my care using the [employer]), agree:  Science writer given to me are supplied by independent, outside transportation providers who do not work for, or have any affiliation with, [employer]. [facility] is not a transportation company. [facility] has no control over the quality or safety of the rides I get using [employer]. [facility] has no control over whether any outside ride will happen on time or not. [facility] gives no guarantee on the reliability, quality, safety, or availability on any rides, or that no mistakes will happen. I know and accept that traveling by vehicle (car, truck, [employer], [employer], bus, taxi, etc.) has risks of serious injuries such as disability, being paralyzed, and death. I know and agree the risk of using [employer] is mine alone, and not [employer]. Transport Services are provided "as is" and as are available.  The transportation providers are in charge for all inspections and care of the vehicles used to provide these rides. I agree not to take legal action against [facility], its agents, employees, officers, directors, representatives, insurers, attorneys, assigns, successors, subsidiaries, and affiliates at any time for any reasons related directly or indirectly to using [employer]. I also agree not to take legal action against [facility] or its affiliates for any injury, death, or damage to property caused by or related to using [employer]. I have read this Waiver and Release of Liability, and I understand the terms used in it and their legal meaning. This Waiver is freely and voluntarily given with the understanding that my right (or any legal minors) to legal action against [facility] relating to [employer] is knowingly given up to use these services.   I attest that I read the Ride Waiver and Release of Liability to Jeremy Powers, gave Mr. Willems the opportunity to ask questions and answered the questions asked (if any). I affirm that Jeremy Powers then provided consent for assistance with transportation.     Jeremy Powers

## 2023-09-09 ENCOUNTER — Telehealth (HOSPITAL_COMMUNITY): Payer: Self-pay | Admitting: Emergency Medicine

## 2023-09-09 ENCOUNTER — Telehealth (HOSPITAL_COMMUNITY): Payer: Self-pay

## 2023-09-09 NOTE — Telephone Encounter (Signed)
 Called to confirm/remind patient of [facility] appointment [facility] on [facility].   Appointment:   [] Confirmed  [x] Left mess   [] No answer/No voice mail  [] VM Full/unable to leave message  [] Phone not in service  And to bring in all medications and/or complete list.

## 2023-09-09 NOTE — Telephone Encounter (Signed)
 Called [name] phone and spoke to Arch Beans to remind him of his appointment at the [facility] in the morning.  Advised him that [facility] will pick him up in the morning @ 8:45 for his 9:30 appointment.   He advised he will be outside waiting.    Ermalinda Hays, EMT-Paramedic [phone] 09/09/2023

## 2023-09-10 ENCOUNTER — Encounter (HOSPITAL_COMMUNITY): Payer: Self-pay

## 2023-09-10 ENCOUNTER — Ambulatory Visit (HOSPITAL_COMMUNITY)
Admission: RE | Admit: 2023-09-10 | Discharge: 2023-09-10 | Disposition: A | Discharge status: Home / Self Care | Source: Ambulatory Visit | Attending: Family Medicine | Admitting: Family Medicine

## 2023-09-10 VITALS — BP 156/66 | HR 63 | Wt 134.2 lb

## 2023-09-10 DIAGNOSIS — I5082 Biventricular heart failure: Secondary | ICD-10-CM | POA: Insufficient documentation

## 2023-09-10 DIAGNOSIS — Z550 Illiteracy and low-level literacy: Secondary | ICD-10-CM | POA: Diagnosis not present

## 2023-09-10 DIAGNOSIS — F1090 Alcohol use, unspecified, uncomplicated: Secondary | ICD-10-CM | POA: Insufficient documentation

## 2023-09-10 DIAGNOSIS — I2510 Atherosclerotic heart disease of native coronary artery without angina pectoris: Secondary | ICD-10-CM | POA: Diagnosis present

## 2023-09-10 DIAGNOSIS — Z5971 Insufficient health insurance coverage: Secondary | ICD-10-CM | POA: Diagnosis not present

## 2023-09-10 DIAGNOSIS — F1290 Cannabis use, unspecified, uncomplicated: Secondary | ICD-10-CM | POA: Insufficient documentation

## 2023-09-10 DIAGNOSIS — D509 Iron deficiency anemia, unspecified: Secondary | ICD-10-CM | POA: Diagnosis not present

## 2023-09-10 DIAGNOSIS — I38 Endocarditis, valve unspecified: Secondary | ICD-10-CM | POA: Diagnosis not present

## 2023-09-10 DIAGNOSIS — I428 Other cardiomyopathies: Secondary | ICD-10-CM | POA: Insufficient documentation

## 2023-09-10 DIAGNOSIS — R9431 Abnormal electrocardiogram [ECG] [EKG]: Secondary | ICD-10-CM | POA: Diagnosis not present

## 2023-09-10 DIAGNOSIS — I080 Rheumatic disorders of both mitral and aortic valves: Secondary | ICD-10-CM | POA: Diagnosis not present

## 2023-09-10 DIAGNOSIS — I5022 Chronic systolic (congestive) heart failure: Secondary | ICD-10-CM

## 2023-09-10 DIAGNOSIS — Z951 Presence of aortocoronary bypass graft: Secondary | ICD-10-CM | POA: Insufficient documentation

## 2023-09-10 DIAGNOSIS — Z955 Presence of coronary angioplasty implant and graft: Secondary | ICD-10-CM | POA: Insufficient documentation

## 2023-09-10 DIAGNOSIS — Z87891 Personal history of nicotine dependence: Secondary | ICD-10-CM | POA: Insufficient documentation

## 2023-09-10 DIAGNOSIS — I447 Left bundle-branch block, unspecified: Secondary | ICD-10-CM

## 2023-09-10 DIAGNOSIS — F1011 Alcohol abuse, in remission: Secondary | ICD-10-CM

## 2023-09-10 DIAGNOSIS — I10 Essential (primary) hypertension: Secondary | ICD-10-CM

## 2023-09-10 LAB — BASIC METABOLIC PANEL WITH GFR
Anion gap: 10 (ref 5–15)
BUN: 9 mg/dL (ref 8–23)
CO2: 22 mmol/L (ref 22–32)
Calcium: 8.9 mg/dL (ref 8.9–10.3)
Chloride: 104 mmol/L (ref 98–111)
Creatinine, Ser: 0.78 mg/dL (ref 0.61–1.24)
GFR, Estimated: >60 mL/min (ref >60)
Glucose, Bld: 86 mg/dL (ref 70–99)
Potassium: 4.1 mmol/L (ref 3.5–5.1)
Sodium: 136 mmol/L (ref 135–145)

## 2023-09-10 LAB — BRAIN NATRIURETIC PEPTIDE: B Natriuretic Peptide: 1098.7 pg/mL — ABNORMAL HIGH (ref 0.0–100.0)

## 2023-09-10 MED ORDER — ENTRESTO 49-51 MG PO TABS
1.0000 | ORAL_TABLET | Freq: Two times a day (BID) | ORAL | 3 refills | Status: AC
Start: 2023-09-10 — End: ?

## 2023-09-10 NOTE — Patient Instructions (Signed)
 Stop digoxin . Increase Entresto  to 49/51 mg twice daily - Rx sent. Labs today - will call you if abnormal. Repeat labs in 10 - 14 days. See below.  It is OK to take over the counter Zyrtec or Claritin for allergies. RETURN TO SEE DR. Julane Ny IN 4 MONTHS. CALL US  AT [phone] IN JULY TO SCHEDULE THIS APPOINTMENT.  Please call us  at [phone] if any questions or concerns prior to your next visit.

## 2023-09-10 NOTE — Progress Notes (Signed)
 Advanced Heart Failure Clinic Note  Date:  09/10/2023   PCP:  Sylvia Everts, PA-C  HF Cardiologist:  Jules Oar, MD   HPI: Jeremy Powers is a 68 y.o.male CAD s/p PCI to LAD in 2015 and 2016, s/p CABG 2017, systolic HF EF 20-25%, microcytic/iron  deficiency anemia followed by hematology and GI, LBBB, and tobacco use. Illiterate and cannot read.   In 2/20 he presented to [facility] ED with SOB, cough, and orthopnea in setting of med non-compliance. Was unable to afford copays with Medicaid. He was tachycardic and tachypneic on arrival, and was transferred to [facility] 06/22/18 for further evaluation. Echo showed newly decreased EF of 25-30% from 50-55% in 12/2017. Underwent R/LHC showing severe 1 v disease with occluded LAD within previous stent. RCA and LCX normal. LIMA to LAD and SVG to D2 patent. Cath also showed low filling pressures with moderately low cardiac output. Meds adjusted as tolerated.   OV 04/05/20 returns for HF follow up after not being seen in > 1 year. Followed by HF Paramedicine. Lasix  was stopped & Farxiga  was started.  Echo 2/22 LVEF 20-25%, RV ok.    3/22 he was discharged from community paramedicine program and bubble pill packs arranged.   Seen in the ED 10/25/20 for CHF exacerbation in the setting of medication non-compliance. He was diuresed with IV lasix  and discharged home. Sent to the ED 12/23/20 by his PCP and found to be A/C CHF and COVID +. Repeat echo EF 10-15%. Diuresed 13 lbs, beta blocker stopped with acute decompensation, digoxin  added, and Entresto  held with low BP.  Admitted 12/22 with A/C CHF after stopping his medications a month prior. Echo showed EF 20-25%. He was diuresed with IV lasix  and GDMT restarted.   Out of HF meds at follow up, volume up. Meds restarted and paramedicine arranged.  Admitted 5/23 with a/c CHF, due to non-compliance with medications. Diuresed with IV lasix . Hospitalization complicated by AKI. GDMT restarted after SCr improved.  Discharged home, weight 138 lbs.  S/p BiV 1/24.  Echo 8/234 showed EF 50-55%, G1DD, normal RV.  Today he returns for HF follow up. Overall feeling fine. He walks outside and around his apartment complex without dyspnea. Only complaint is dry cough at night. Denies palpitations, abnormal bleeding, CP, dizziness, edema, or PND/Orthopnea. Appetite ok. No fever or chills. Weight at home 132 pounds. Taking all medications. Followed by paramedicine, drinks 2 tallboy beers every few days.   Cardiac Studies: - Echo (8/24): EF 50-55%, G1DD, normal RV  - Echo (12/22): EF 20-25%, global HK, grade II DD, RV moderately reduced, moderate MR  - Echo (8/22): EF 10-15%, Repeat echo showed EF 10-15%, severe global hypokinesis with septal akinesis, LV severely dilated, grade II DD, RV fxn moderate to severely reduced, moderate to severe MR, moderate to severe AI.   - Echo (2/22): EF 20-25%, RV ok   - Echo (9/20): EF 20-25%   - Echo (2/20): EF 20-25%   - cMRI (06/27/18): EF 24%. Normal RV   - R/LHC (2/20) RA = 1 RV = 19/3 PA = 24/5 (14) PCW = 7 Fick cardiac output/index = 4.0/2.5 PVR = 1.9 WU Ao sat = 95% PA sat = 68%, 71%   Assessment: 1. Severe 1-vessel CAD with occluded LAD within previous stent 2. Normal RCA and LCX 3. LIMA to LAD widely patent 4. SVG - D2 widely patent 5. SVG - D1 likely occluded (grafts not marked)  ROS: All systems negative except  as listed in HPI, PMH and Problem List.  Past Medical History:  Diagnosis Date   CHF (congestive heart failure) (HCC)    Cirrhosis (HCC)    Coronary artery disease    Hypertension    Pancreatitis    Past Surgical History:  Procedure Laterality Date   BIV ICD INSERTION CRT-D N/A 05/28/2022   Procedure: BIV ICD INSERTION CRT-D;  Surgeon: Tammie Fall, MD;  Location: [facility] INVASIVE CV LAB;  Service: Cardiovascular;  Laterality: N/A;   CARDIAC SURGERY     RIGHT/LEFT HEART CATH AND CORONARY/GRAFT ANGIOGRAPHY N/A 06/24/2018   Procedure:  RIGHT/LEFT HEART CATH AND CORONARY/GRAFT ANGIOGRAPHY;  Surgeon: Mardell Shade, MD;  Location: [facility] INVASIVE CV LAB;  Service: Cardiovascular;  Laterality: N/A;   Current Outpatient Medications  Medication Sig Dispense Refill   aspirin  EC 81 MG tablet TAKE 1 TABLET (81 MG TOTAL) BY MOUTH DAILY. NEEDS FOLLOW UP APPOINTMENT FOR MORE REFILLS 30 tablet 0   atorvastatin  (LIPITOR) 40 MG tablet TAKE 1 TABLET (40 MG TOTAL) BY MOUTH DAILY. NEEDS FOLLOW UP APPOINTMENT FOR ANYMORE REFILLS 90 tablet 0   carvedilol  (COREG ) 3.125 MG tablet TAKE 1 TABLET (3.125 MG TOTAL) BY MOUTH TWO (TWO) TIMES DAILY. NEEDS APPOINTMENT FOR MORE REFILLS 180 tablet 1   digoxin  (LANOXIN ) 0.125 MG tablet TAKE ONE TABLET BY MOUTH DAILY 90 tablet 3   FARXIGA  10 MG TABS tablet TAKE 1 TABLET (10 MG TOTAL) BY MOUTH DAILY WITH BREAKFAST. NEEDS FOLLOW UP APPOINTMENT FOR ANYMORE REFILLS 90 tablet 1   FEROSUL 325 (65 Fe) MG tablet TAKE 1 TABLET (325 MG TOTAL) BY MOUTH TWO (TWO) TIMES DAILY 30 tablet 1   furosemide  (LASIX ) 40 MG tablet TAKE 1 TABLET (40 MG TOTAL) BY MOUTH DAILY 90 tablet 3   potassium chloride  SA (KLOR-CON  M) 20 MEQ tablet TAKE ONE TABLET BY MOUTH DAILY 90 tablet 0   sacubitril -valsartan  (ENTRESTO ) 24-26 MG TAKE ONE TABLET BY MOUTH TWICE A DAY 60 tablet 2   spironolactone  (ALDACTONE ) 25 MG tablet TAKE ONE TABLET BY MOUTH AT BEDTIME 30 tablet 2   No current facility-administered medications for this encounter.   Allergies:   Patient has no known allergies.   Social History:  The patient  reports that he has quit smoking. His smoking use included cigarettes. He has never used smokeless tobacco. He reports current alcohol use. He reports current drug use. Drug: Marijuana.   Family History:  The patient's family history is not on file.   ROS:  Please see the history of present illness.   All other systems are personally reviewed and negative.   Recent Labs: 12/18/2022: B Natriuretic Peptide 170.7; BUN 19; Creatinine,  Ser 1.24; Potassium 4.0; Sodium 134 01/14/2023: Hemoglobin 12.0; Platelets 275.0  Personally reviewed   BP (!) 156/66   Pulse 63   Wt 60.9 kg (134 lb 3.2 oz)   SpO2 98%   BMI 20.41 kg/m   Wt Readings from Last 3 Encounters:  09/10/23 60.9 kg (134 lb 3.2 oz)  08/20/23 59.9 kg (132 lb)  08/06/23 58.7 kg (129 lb 6.4 oz)   Physical Exam: General:  NAD. No resp difficulty, walked into clinic, thin HEENT: Normal Neck: Supple. No JVD. Cor: Regular rate & rhythm. No rubs, gallops or murmurs. Lungs: Clear Abdomen: Soft, nontender, nondistended.  Extremities: No cyanosis, clubbing, rash, edema Neuro: Alert & oriented x 3, moves all 4 extremities w/o difficulty. Affect pleasant.  Device interrogation (personally reviewed from 09/06/23): HL Score 0, 2.3 hr/day activity,  average HR 72 bpm, 100% BiV pacing  ECG (personally reviewed):  A sensed V paced  ASSESSMENT AND PLAN: 1.  Chronic biventricular heart failure, NICM - Echo (2/20): EF 25-30%,   - cMRI (2/20) with EF 24% - Echo (9/20): EF 20-25% - Echo (2/22): EF 20-25% RV ok - Echo (8/22): EF 10-15%, moderately to severely reduced RV function, moderate to severe MR, moderate to severe AI. - Echo (12/22): EF 20-25%, global HK, grade II DD, RV moderately reduced, moderate MR - s/p BiV ICD 1/24 - Echo 8/234 showed EF 50-55%, G1DD, normal RV - Much improved with Paramedicine help. NYHA I. Volume OK, HL Score 0 - With improved EF, stop digoxin . - Increase Entresto  to 49/51 mg bid   - Continue Lasix  40 mg daily + 20 KCL daily. - Continue carvedilol  3.125 mg bid. - Continue Farxiga  10 mg daily. - Continue spiro 25 mg daily. - He is not a candidate for advanced therapies. - Doing well with paramedicine help.  - Labs today. Repeat BMET in 10-14 days.  2. CAD - s/p CABG 2017 - LHC 06/2018  with occluded LAD normal LCX and RCA. LIMA to LAD and SVG to D2 patent (SVG to D1 occluded) - No s/s angina - Continue ASA 81 + atorvastatin .       3. LBBB - QRS 170 msec on ECG today. - now s/p CRT-D   4. Valvular heart disease - Moderate MR and mild AI on echo 12/22. LVIDd 7.1 cm - Will follow. - Mild on echo 8/24   5. h/o tobacco use - No longer smoking.  - Lives with people who do smoke.  6. ETOH use - Drinks 2 beers every few days - Discussed cutting back/stopping.   7. HTN - BP up today - Increase Entresto  as above - Labs today.   8. Anemia - Iron  deficient. On oral Fe. - Followed by PCP.  9. Illiterate/SDOH - Cannot read. Able to write numbers. Needs assistance with medications.    - Continue w/ paramedicine.   Doing well! Follow up in 4 months with Dr. Bensimhon.    Jeremy Spillers, FNP  09/10/2023 9:31 AM  [facility] [address] [facility] [place] [address] [phone] (office) [phone] (fax)

## 2023-09-11 ENCOUNTER — Other Ambulatory Visit (HOSPITAL_COMMUNITY): Payer: Self-pay | Admitting: Emergency Medicine

## 2023-09-11 ENCOUNTER — Other Ambulatory Visit: Payer: Self-pay | Admitting: *Deleted

## 2023-09-11 DIAGNOSIS — R600 Localized edema: Secondary | ICD-10-CM

## 2023-09-11 NOTE — Progress Notes (Unsigned)
 Paramedicine Encounter    Patient ID: Jeremy Powers, male    DOB: 1955-10-20, 69 y.o.   MRN: [id number]   Complaints***  Assessment***  Compliance with meds***  Pill box filled***  Refills needed***  Meds changes since last visit***    Social changes***   There were no vitals taken for this visit. Weight yesterday-*** Last visit weight-***  ACTION: {Paramed [phone]}  Ermalinda Hays, EMT-Paramedic [phone] 09/11/23  Patient Care Team: Francine Iron as PCP - General (Internal Medicine) Bensimhon, Rheta Celestine, MD as PCP - Cardiology (Cardiology)  Patient Active Problem List   Diagnosis Date Noted  . 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 Biventricular ICD (implantable cardioverter-defibrillator) in place 09/04/2022  . NICM (nonischemic cardiomyopathy) (HCC) 03/20/2022  . Malnutrition of moderate degree 09/23/2021  . Dyslipidemia 09/21/2021  . Anemia 09/21/2021  . AKI (acute kidney injury) (HCC) 09/21/2021  . Essential hypertension 09/20/2021  . CHF (congestive heart failure) (HCC) 05/08/2021  . Acute on chronic systolic CHF (congestive heart failure) (HCC) 05/06/2021  . Mitral regurgitation 12/26/2020  . COVID-19 virus infection 12/24/2020  . CHF exacerbation (HCC) 12/23/2020  . Acute on chronic systolic HF (heart failure) (HCC) 06/24/2018  . Aortic insufficiency 06/22/2018  . Acute on chronic congestive heart failure (HCC) 06/22/2018  . Elevated troponin 06/22/2018  . CAD (coronary artery disease) 06/22/2018  . Left bundle branch block 06/22/2018  . History of iron  deficiency anemia 06/22/2018  . Acute on chronic heart failure (HCC) 06/22/2018  . Chronic bilateral low back pain without sciatica 12/12/2015  . 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  . Iron  deficiency anemia 03/28/2015  . Hyperlipidemia 10/12/2014    Current Outpatient Medications:  .  aspirin  EC 81 MG tablet, TAKE 1 TABLET (81 MG TOTAL) BY MOUTH DAILY. NEEDS FOLLOW UP APPOINTMENT  FOR MORE REFILLS, Disp: 30 tablet, Rfl: 0 .  atorvastatin  (LIPITOR) 40 MG tablet, TAKE 1 TABLET (40 MG TOTAL) BY MOUTH DAILY. NEEDS FOLLOW UP APPOINTMENT FOR ANYMORE REFILLS, Disp: 90 tablet, Rfl: 0 .  carvedilol  (COREG ) 3.125 MG tablet, TAKE 1 TABLET (3.125 MG TOTAL) BY MOUTH TWO (TWO) TIMES DAILY. NEEDS APPOINTMENT FOR MORE REFILLS, Disp: 180 tablet, Rfl: 1 .  FARXIGA  10 MG TABS tablet, TAKE 1 TABLET (10 MG TOTAL) BY MOUTH DAILY WITH BREAKFAST. NEEDS FOLLOW UP APPOINTMENT FOR ANYMORE REFILLS, Disp: 90 tablet, Rfl: 1 .  FEROSUL 325 (65 Fe) MG tablet, TAKE 1 TABLET (325 MG TOTAL) BY MOUTH TWO (TWO) TIMES DAILY, Disp: 30 tablet, Rfl: 1 .  furosemide  (LASIX ) 40 MG tablet, TAKE 1 TABLET (40 MG TOTAL) BY MOUTH DAILY, Disp: 90 tablet, Rfl: 3 .  potassium chloride  SA (KLOR-CON  M) 20 MEQ tablet, TAKE ONE TABLET BY MOUTH DAILY, Disp: 90 tablet, Rfl: 0 .  sacubitril -valsartan  (ENTRESTO ) 49-51 MG, Take 1 tablet by mouth 2 (two) times daily., Disp: 180 tablet, Rfl: 3 .  spironolactone  (ALDACTONE ) 25 MG tablet, TAKE ONE TABLET BY MOUTH AT BEDTIME, Disp: 30 tablet, Rfl: 2 No Known Allergies   Social History   Socioeconomic History  . Marital status: Single    Spouse name: Not on file  . Number of children: Not on file  . Years of education: Not on file  . Highest education level: Not on file  Occupational History  . Not on file  Tobacco Use  . Smoking status: Former    Current packs/day: 0.50    Types: Cigarettes  . Smokeless tobacco: Never  Vaping Use  . Vaping status: Never Used  Substance and Sexual Activity  . Alcohol use: Yes    Comment: daily  . Drug use: Yes    Types: Marijuana  . Sexual activity: Not on file  Other Topics Concern  . Not on file  Social History Narrative  . Not on file   Social Drivers of Health   Financial Resource Strain: Not on file  Food Insecurity: Food Insecurity Present (10/17/2021)   Hunger Vital Sign   . Worried About Programme researcher, broadcasting/film/video in the Last  Year: Often true   . Ran Out of Food in the Last Year: Sometimes true  Transportation Needs: Unmet Transportation Needs (09/04/2023)   PRAPARE - Transportation   . Lack of Transportation (Medical): Yes   . Lack of Transportation (Non-Medical): Yes  Physical Activity: Not on file  Stress: Not on file  Social Connections: Not on file  Intimate Partner Violence: Not on file    Physical Exam      Future Appointments  Date Time Provider Department Center  09/18/2023  1:15 PM [facility] DEVICE REMOTES [facility]  09/20/2023  8:30 AM [facility] LAB [facility] None  12/18/2023  1:15 PM [facility] DEVICE REMOTES [facility]  03/18/2024  1:15 PM [facility] DEVICE REMOTES [facility]

## 2023-09-13 ENCOUNTER — Other Ambulatory Visit (HOSPITAL_COMMUNITY): Payer: Self-pay | Admitting: Family Medicine

## 2023-09-18 ENCOUNTER — Ambulatory Visit

## 2023-09-18 ENCOUNTER — Other Ambulatory Visit (HOSPITAL_COMMUNITY): Payer: Self-pay | Admitting: Emergency Medicine

## 2023-09-18 ENCOUNTER — Ambulatory Visit: Payer: 59 | Attending: Internal Medicine

## 2023-09-18 DIAGNOSIS — I428 Other cardiomyopathies: Secondary | ICD-10-CM | POA: Diagnosis not present

## 2023-09-18 DIAGNOSIS — Z95810 Presence of automatic (implantable) cardiac defibrillator: Secondary | ICD-10-CM

## 2023-09-18 DIAGNOSIS — I5022 Chronic systolic (congestive) heart failure: Secondary | ICD-10-CM

## 2023-09-18 NOTE — Progress Notes (Signed)
 Paramedicine Encounter    Patient ID: Jeremy Powers, male    DOB: [birth year], 68 y.o.   MRN: [id number]   Complaints NONE  Assessment A&O x 4, skin W&D w/ good color.  Pt denies chest pain or SOB.  Lung sounds w/ some mild ronchi noted.  Compliance with meds YES  Pill box filled x 2 weeks  Refills needed NONE  Meds changes since last visit NONE    Social changes NONE   BP 118/60 (BP Location: Left Arm, Patient Position: Sitting, Cuff Size: Normal)   Pulse 60   Resp 16   Wt 125 lb 9.6 oz (57 kg)   SpO2 97%   BMI 19.10 kg/m  Weight yesterday- not taken  Last visit weight-   ACTION: Home visit completed  Carlton Chick [phone] 09/18/23  Patient Care Team: Francine Iron as PCP - General (Internal Medicine) Julane Ny Rheta Celestine, MD as PCP - Cardiology (Cardiology)  Patient Active Problem List   Diagnosis Date Noted   Lipoma 12/18/2022   Biventricular ICD (implantable cardioverter-defibrillator) in place 09/04/2022   NICM (nonischemic cardiomyopathy) (HCC) 03/20/2022   Malnutrition of moderate degree 09/23/2021   Dyslipidemia 09/21/2021   Anemia 09/21/2021   AKI (acute kidney injury) (HCC) 09/21/2021   Essential hypertension 09/20/2021   CHF (congestive heart failure) (HCC) 05/08/2021   Acute on chronic systolic CHF (congestive heart failure) (HCC) 05/06/2021   Mitral regurgitation 12/26/2020   COVID-19 virus infection 12/24/2020   CHF exacerbation (HCC) 12/23/2020   Acute on chronic systolic HF (heart failure) (HCC) 06/24/2018   Aortic insufficiency 06/22/2018   Acute on chronic congestive heart failure (HCC) 06/22/2018   Elevated troponin 06/22/2018   CAD (coronary artery disease) 06/22/2018   Left bundle branch block 06/22/2018   History of iron  deficiency anemia 06/22/2018   Acute on chronic heart failure (HCC) 06/22/2018   Chronic bilateral low back pain without sciatica 12/12/2015   Presence of aortocoronary bypass graft  12/12/2015   Iron  deficiency anemia 03/28/2015   Hyperlipidemia 10/12/2014    Current Outpatient Medications:    aspirin  EC 81 MG tablet, TAKE 1 TABLET (81 MG TOTAL) BY MOUTH DAILY. NEEDS FOLLOW UP APPOINTMENT FOR MORE REFILLS, Disp: 30 tablet, Rfl: 0   atorvastatin  (LIPITOR) 40 MG tablet, TAKE 1 TABLET (40 MG TOTAL) BY MOUTH DAILY. NEEDS FOLLOW UP APPOINTMENT FOR ANYMORE REFILLS, Disp: 90 tablet, Rfl: 0   carvedilol  (COREG ) 3.125 MG tablet, TAKE 1 TABLET (3.125 MG TOTAL) BY MOUTH TWO (TWO) TIMES DAILY. NEEDS APPOINTMENT FOR MORE REFILLS, Disp: 180 tablet, Rfl: 1   FARXIGA  10 MG TABS tablet, TAKE 1 TABLET (10 MG TOTAL) BY MOUTH DAILY WITH BREAKFAST. NEEDS FOLLOW UP APPOINTMENT FOR ANYMORE REFILLS, Disp: 90 tablet, Rfl: 1   FEROSUL 325 (65 Fe) MG tablet, TAKE 1 TABLET (325 MG TOTAL) BY MOUTH TWO (TWO) TIMES DAILY, Disp: 30 tablet, Rfl: 1   furosemide  (LASIX ) 40 MG tablet, TAKE 1 TABLET (40 MG TOTAL) BY MOUTH DAILY, Disp: 90 tablet, Rfl: 3   potassium chloride  SA (KLOR-CON  M) 20 MEQ tablet, TAKE ONE TABLET BY MOUTH DAILY, Disp: 90 tablet, Rfl: 0   sacubitril -valsartan  (ENTRESTO ) 49-51 MG, Take 1 tablet by mouth 2 (two) times daily., Disp: 180 tablet, Rfl: 3   spironolactone  (ALDACTONE ) 25 MG tablet, TAKE ONE TABLET BY MOUTH AT BEDTIME, Disp: 30 tablet, Rfl: 2 No Known Allergies   Social History   Socioeconomic History   Marital status: Single    Spouse name: Not on  file   Number of children: Not on file   Years of education: Not on file   Highest education level: Not on file  Occupational History   Not on file  Tobacco Use   Smoking status: Former    Current packs/day: 0.50    Types: Cigarettes   Smokeless tobacco: Never  Vaping Use   Vaping status: Never Used  Substance and Sexual Activity   Alcohol use: Yes    Comment: daily   Drug use: Yes    Types: Marijuana   Sexual activity: Not on file  Other Topics Concern   Not on file  Social History Narrative   Not on file    Social Drivers of Health   Financial Resource Strain: Not on file  Food Insecurity: Food Insecurity Present (10/17/2021)   Hunger Vital Sign    Worried About Running Out of Food in the Last Year: Often true    Ran Out of Food in the Last Year: Sometimes true  Transportation Needs: Unmet Transportation Needs (09/04/2023)   PRAPARE - Administrator, Civil Service (Medical): Yes    Lack of Transportation (Non-Medical): Yes  Physical Activity: Not on file  Stress: Not on file  Social Connections: Not on file  Intimate Partner Violence: Not on file    Physical Exam      Future Appointments  Date Time Provider Department Center  09/20/2023  8:30 AM [facility] LAB [facility] None  12/18/2023  1:15 PM [facility] DEVICE REMOTES [facility]  03/18/2024  1:15 PM [facility] DEVICE REMOTES [facility]

## 2023-09-18 NOTE — Progress Notes (Signed)
 EPIC Encounter for ICM Monitoring  Patient Name: Jeremy Powers is a 67 y.o. male Date: 09/18/2023 Primary Care Physican: Sylvia Everts, PA-C Primary Cardiologist: Bensimhon Electrophysiologist: Carolynne Citron 09/10/2023 Office Weight: 134 lbs  HF clinic fluid level check following 4/22 OV.   Transmission results reviewed.   [employer]ic Heart Failure Index is 0 suggesting  fluid levels are within normal threshold range.  Prescribed:  Furosemide  40 mg take 1 tablet(s) (40 mg total) by mouth daily. Potassium 20 mEq take 1 tablet by mouth daily. Spironolactone  25 mg take 1 tablet (25 mg total)  by mouth at bed time.   Recommendations: Copy sent to [name], NP as requested following 4/22 OV.    Follow-up plan: No further ICM clinic phone appointments.   91 day device clinic remote transmission 12/18/2023.             EP/Cardiology next office visit: 09/20/2023 with HF clinic.         Copy of ICM check sent to Dr. Carolynne Citron.  3 Month HeartLogicT Heart Failure Index:    8 Day Data Trend:          Almyra Jain, RN 09/18/2023 7:41 AM

## 2023-09-19 LAB — CUP PACEART REMOTE DEVICE CHECK
Battery Remaining Longevity: 132 mo
Battery Remaining Percentage: 100 %
Brady Statistic RA Percent Paced: 2 %
Brady Statistic RV Percent Paced: 36 %
Date Time Interrogation Session: 20250430002200
HighPow Impedance: 67 Ohm
Implantable Lead Connection Status: 753985
Implantable Lead Connection Status: 753985
Implantable Lead Connection Status: 753985
Implantable Lead Implant Date: 20240108
Implantable Lead Implant Date: 20240108
Implantable Lead Implant Date: 20240108
Implantable Lead Location: 753858
Implantable Lead Location: 753859
Implantable Lead Location: 753860
Implantable Lead Model: 137
Implantable Lead Model: 4671
Implantable Lead Model: 7841
Implantable Lead Serial Number: 1396493
Implantable Lead Serial Number: 301415
Implantable Lead Serial Number: 881901
Implantable Pulse Generator Implant Date: 20240108
Lead Channel Impedance Value: 476 Ohm
Lead Channel Impedance Value: 505 Ohm
Lead Channel Impedance Value: 567 Ohm
Lead Channel Pacing Threshold Amplitude: 0.4 V
Lead Channel Pacing Threshold Amplitude: 0.8 V
Lead Channel Pacing Threshold Amplitude: 1.2 V
Lead Channel Pacing Threshold Pulse Width: 0.4 ms
Lead Channel Pacing Threshold Pulse Width: 0.4 ms
Lead Channel Pacing Threshold Pulse Width: 0.4 ms
Lead Channel Setting Pacing Amplitude: 2.5 V
Lead Channel Setting Pacing Amplitude: 2.5 V
Lead Channel Setting Pacing Amplitude: 2.5 V
Lead Channel Setting Pacing Pulse Width: 0.4 ms
Lead Channel Setting Pacing Pulse Width: 0.4 ms
Lead Channel Setting Sensing Sensitivity: 0.5 mV
Lead Channel Setting Sensing Sensitivity: 1 mV
Pulse Gen Serial Number: 394480

## 2023-09-20 ENCOUNTER — Ambulatory Visit (HOSPITAL_COMMUNITY)
Admission: RE | Admit: 2023-09-20 | Discharge: 2023-09-20 | Disposition: A | Discharge status: Home / Self Care | Source: Ambulatory Visit | Attending: Internal Medicine

## 2023-09-20 DIAGNOSIS — I5022 Chronic systolic (congestive) heart failure: Secondary | ICD-10-CM | POA: Insufficient documentation

## 2023-09-20 LAB — BASIC METABOLIC PANEL WITH GFR
Anion gap: 9 (ref 5–15)
BUN: 20 mg/dL (ref 8–23)
CO2: 22 mmol/L (ref 22–32)
Calcium: 8.4 mg/dL — ABNORMAL LOW (ref 8.9–10.3)
Chloride: 104 mmol/L (ref 98–111)
Creatinine, Ser: 1.27 mg/dL — ABNORMAL HIGH (ref 0.61–1.24)
GFR, Estimated: >60 mL/min (ref >60)
Glucose, Bld: 99 mg/dL (ref 70–99)
Potassium: 4.4 mmol/L (ref 3.5–5.1)
Sodium: 135 mmol/L (ref 135–145)

## 2023-09-24 ENCOUNTER — Encounter: Payer: Self-pay | Admitting: Cardiology

## 2023-10-03 ENCOUNTER — Telehealth: Payer: Self-pay | Admitting: Internal Medicine

## 2023-10-03 ENCOUNTER — Other Ambulatory Visit (HOSPITAL_COMMUNITY): Payer: Self-pay | Admitting: Emergency Medicine

## 2023-10-03 NOTE — Telephone Encounter (Signed)
 Pt is trying to get MRI and radiology is needing pacemaker info. Requesting cb

## 2023-10-03 NOTE — Telephone Encounter (Signed)
 Called and spoke with pts caregiver and advised they need to send a clearance form.

## 2023-10-03 NOTE — Progress Notes (Signed)
 Paramedicine Encounter    Patient ID: Jeremy Powers, male    DOB: 1956-03-09, 68 y.o.   MRN: [id number]   Complaints NONE  Assessment A&O x 4, skin W&D w/ good color.  Denies chest pain or SOB.  Lung sounds clear and equal bilat.  No peripheral edema noted  Compliance with meds yes  Pill box filled x 2 weeks  Refills needed NONE  Meds changes since last visit NONE    Social changes NONE   BP 130/80 (BP Location: Left Arm, Patient Position: Sitting, Cuff Size: Normal)   Pulse 65   Resp 16   Wt 133 lb 12.8 oz (60.7 kg)   SpO2 98%   BMI 20.34 kg/m  Weight yesterday- not taken Last visit weight-125.9lb   Attempted to assist pt w/ scheduling an MRI on his left leg ordered by Dr. Lovenia Ruby.  Had to call Dr. Wilton Hasting office to get information on his pacemaker device before he and get approved for the MRI.  Waiting on call from Dr. Meridith Stanford office w/ information so that I can reach out to [facility] to get MRI set up. Pt reports to be feeling well.  He denies chest pain or SOB.  Lung sounds clear bilat and no edema noted.  He continues to do well w/ his med compliance.  Med box reconciled x 2 weeks.  ACTION: Home visit completed  Carlton Chick [phone] 10/03/23  Patient Care Team: Francine Iron as PCP - General (Internal Medicine) Julane Ny Rheta Celestine, MD as PCP - Cardiology (Cardiology)  Patient Active Problem List   Diagnosis Date Noted   Lipoma 12/18/2022   Biventricular ICD (implantable cardioverter-defibrillator) in place 09/04/2022   NICM (nonischemic cardiomyopathy) (HCC) 03/20/2022   Malnutrition of moderate degree 09/23/2021   Dyslipidemia 09/21/2021   Anemia 09/21/2021   AKI (acute kidney injury) (HCC) 09/21/2021   Essential hypertension 09/20/2021   CHF (congestive heart failure) (HCC) 05/08/2021   Acute on chronic systolic CHF (congestive heart failure) (HCC) 05/06/2021   Mitral regurgitation 12/26/2020   COVID-19 virus  infection 12/24/2020   CHF exacerbation (HCC) 12/23/2020   Acute on chronic systolic HF (heart failure) (HCC) 06/24/2018   Aortic insufficiency 06/22/2018   Acute on chronic congestive heart failure (HCC) 06/22/2018   Elevated troponin 06/22/2018   CAD (coronary artery disease) 06/22/2018   Left bundle branch block 06/22/2018   History of iron  deficiency anemia 06/22/2018   Acute on chronic heart failure (HCC) 06/22/2018   Chronic bilateral low back pain without sciatica 12/12/2015   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   Iron  deficiency anemia 03/28/2015   Hyperlipidemia 10/12/2014    Current Outpatient Medications:    aspirin  EC 81 MG tablet, TAKE 1 TABLET (81 MG TOTAL) BY MOUTH DAILY. NEEDS FOLLOW UP APPOINTMENT FOR MORE REFILLS, Disp: 30 tablet, Rfl: 0   atorvastatin  (LIPITOR) 40 MG tablet, TAKE 1 TABLET (40 MG TOTAL) BY MOUTH DAILY. NEEDS FOLLOW UP APPOINTMENT FOR ANYMORE REFILLS, Disp: 90 tablet, Rfl: 0   carvedilol  (COREG ) 3.125 MG tablet, TAKE 1 TABLET (3.125 MG TOTAL) BY MOUTH TWO (TWO) TIMES DAILY. NEEDS APPOINTMENT FOR MORE REFILLS, Disp: 180 tablet, Rfl: 1   FARXIGA  10 MG TABS tablet, TAKE 1 TABLET (10 MG TOTAL) BY MOUTH DAILY WITH BREAKFAST. NEEDS FOLLOW UP APPOINTMENT FOR ANYMORE REFILLS, Disp: 90 tablet, Rfl: 1   FEROSUL 325 (65 Fe) MG tablet, TAKE 1 TABLET (325 MG TOTAL) BY MOUTH TWO (TWO) TIMES DAILY, Disp: 30 tablet,  Rfl: 1   furosemide  (LASIX ) 40 MG tablet, TAKE 1 TABLET (40 MG TOTAL) BY MOUTH DAILY, Disp: 90 tablet, Rfl: 3   potassium chloride  SA (KLOR-CON  M) 20 MEQ tablet, TAKE ONE TABLET BY MOUTH DAILY, Disp: 90 tablet, Rfl: 0   sacubitril -valsartan  (ENTRESTO ) 49-51 MG, Take 1 tablet by mouth 2 (two) times daily., Disp: 180 tablet, Rfl: 3   spironolactone  (ALDACTONE ) 25 MG tablet, TAKE ONE TABLET BY MOUTH AT BEDTIME, Disp: 30 tablet, Rfl: 2 No Known Allergies   Social History   Socioeconomic History   Marital status: Single    Spouse name: Not on  file   Number of children: Not on file   Years of education: Not on file   Highest education level: Not on file  Occupational History   Not on file  Tobacco Use   Smoking status: Former    Current packs/day: 0.50    Types: Cigarettes   Smokeless tobacco: Never  Vaping Use   Vaping status: Never Used  Substance and Sexual Activity   Alcohol use: Yes    Comment: daily   Drug use: Yes    Types: Marijuana   Sexual activity: Not on file  Other Topics Concern   Not on file  Social History Narrative   Not on file   Social Drivers of Health   Financial Resource Strain: Not on file  Food Insecurity: Food Insecurity Present (10/17/2021)   Hunger Vital Sign    Worried About Running Out of Food in the Last Year: Often true    Ran Out of Food in the Last Year: Sometimes true  Transportation Needs: Unmet Transportation Needs (09/04/2023)   PRAPARE - Administrator, Civil Service (Medical): Yes    Lack of Transportation (Non-Medical): Yes  Physical Activity: Not on file  Stress: Not on file  Social Connections: Not on file  Intimate Partner Violence: Not on file    Physical Exam      Future Appointments  Date Time Provider Department Center  12/18/2023  1:15 PM [facility] HVT DEVICE REMOTES [facility]  03/18/2024  1:15 PM [facility] HVT DEVICE REMOTES [facility]

## 2023-10-15 ENCOUNTER — Other Ambulatory Visit (HOSPITAL_COMMUNITY): Payer: Self-pay | Admitting: Emergency Medicine

## 2023-10-15 NOTE — Progress Notes (Unsigned)
 Paramedicine Encounter    Patient ID: Jeremy Powers, male    DOB: 23-Apr-1956, 68 y.o.   MRN: [id number]   Complaints NONE  Assessment A&O x 4, skin W&D w/ good color.  Denies chest pain or SOB.  Lung sounds clear and equal bilat.  No peripheral edema noted.  Compliance with meds >75% compliant  Pill box filled x 2 weeks  Refills needed NONE  Meds changes since last visit NONE    Social changes NONE   BP (!) 140/80 (BP Location: Left Arm, Patient Position: Sitting, Cuff Size: Normal)   Pulse 70   Resp 16   Wt 123 lb 6.4 oz (56 kg)   SpO2 98%   BMI 18.76 kg/m  Weight yesterday- not taken Last visit weight-133lb   ACTION: Home visit completed   Carlton Chick [phone] 10/15/23  Patient Care Team: Francine Iron as PCP - General (Internal Medicine) Julane Ny, Rheta Celestine, MD as PCP - Cardiology (Cardiology)  Patient Active Problem List   Diagnosis Date Noted  . 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 Biventricular ICD (implantable cardioverter-defibrillator) in place 09/04/2022  . NICM (nonischemic cardiomyopathy) (HCC) 03/20/2022  . Malnutrition of moderate degree 09/23/2021  . Dyslipidemia 09/21/2021  . Anemia 09/21/2021  . AKI (acute kidney injury) (HCC) 09/21/2021  . Essential hypertension 09/20/2021  . CHF (congestive heart failure) (HCC) 05/08/2021  . Acute on chronic systolic CHF (congestive heart failure) (HCC) 05/06/2021  . Mitral regurgitation 12/26/2020  . COVID-19 virus infection 12/24/2020  . CHF exacerbation (HCC) 12/23/2020  . Acute on chronic systolic HF (heart failure) (HCC) 06/24/2018  . Aortic insufficiency 06/22/2018  . Acute on chronic congestive heart failure (HCC) 06/22/2018  . Elevated troponin 06/22/2018  . CAD (coronary artery disease) 06/22/2018  . Left bundle branch block 06/22/2018  . History of iron  deficiency anemia 06/22/2018  . Acute on chronic heart failure (HCC) 06/22/2018  . Chronic bilateral low back pain without  sciatica 12/12/2015  . 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  . Iron  deficiency anemia 03/28/2015  . Hyperlipidemia 10/12/2014    Current Outpatient Medications:  .  aspirin  EC 81 MG tablet, TAKE 1 TABLET (81 MG TOTAL) BY MOUTH DAILY. NEEDS FOLLOW UP APPOINTMENT FOR MORE REFILLS, Disp: 30 tablet, Rfl: 0 .  atorvastatin  (LIPITOR) 40 MG tablet, TAKE 1 TABLET (40 MG TOTAL) BY MOUTH DAILY. NEEDS FOLLOW UP APPOINTMENT FOR ANYMORE REFILLS, Disp: 90 tablet, Rfl: 0 .  carvedilol  (COREG ) 3.125 MG tablet, TAKE 1 TABLET (3.125 MG TOTAL) BY MOUTH TWO (TWO) TIMES DAILY. NEEDS APPOINTMENT FOR MORE REFILLS, Disp: 180 tablet, Rfl: 1 .  FARXIGA  10 MG TABS tablet, TAKE 1 TABLET (10 MG TOTAL) BY MOUTH DAILY WITH BREAKFAST. NEEDS FOLLOW UP APPOINTMENT FOR ANYMORE REFILLS, Disp: 90 tablet, Rfl: 1 .  FEROSUL 325 (65 Fe) MG tablet, TAKE 1 TABLET (325 MG TOTAL) BY MOUTH TWO (TWO) TIMES DAILY, Disp: 30 tablet, Rfl: 1 .  furosemide  (LASIX ) 40 MG tablet, TAKE 1 TABLET (40 MG TOTAL) BY MOUTH DAILY, Disp: 90 tablet, Rfl: 3 .  potassium chloride  SA (KLOR-CON  M) 20 MEQ tablet, TAKE ONE TABLET BY MOUTH DAILY, Disp: 90 tablet, Rfl: 0 .  sacubitril -valsartan  (ENTRESTO ) 49-51 MG, Take 1 tablet by mouth 2 (two) times daily., Disp: 180 tablet, Rfl: 3 .  spironolactone  (ALDACTONE ) 25 MG tablet, TAKE ONE TABLET BY MOUTH AT BEDTIME, Disp: 30 tablet, Rfl: 2 No Known Allergies   Social History   Socioeconomic History  . Marital status: Single  Spouse name: Not on file  . Number of children: Not on file  . Years of education: Not on file  . Highest education level: Not on file  Occupational History  . Not on file  Tobacco Use  . Smoking status: Former    Current packs/day: 0.50    Types: Cigarettes  . Smokeless tobacco: Never  Vaping Use  . Vaping status: Never Used  Substance and Sexual Activity  . Alcohol use: Yes    Comment: daily  . Drug use: Yes    Types: Marijuana  . Sexual activity: Not on file   Other Topics Concern  . Not on file  Social History Narrative  . Not on file   Social Drivers of Health   Financial Resource Strain: Not on file  Food Insecurity: Food Insecurity Present (10/17/2021)   Hunger Vital Sign   . Worried About Programme researcher, broadcasting/film/video in the Last Year: Often true   . Ran Out of Food in the Last Year: Sometimes true  Transportation Needs: Unmet Transportation Needs (09/04/2023)   PRAPARE - Transportation   . Lack of Transportation (Medical): Yes   . Lack of Transportation (Non-Medical): Yes  Physical Activity: Not on file  Stress: Not on file  Social Connections: Not on file  Intimate Partner Violence: Not on file    Physical Exam      Future Appointments  Date Time Provider Department Center  12/18/2023  1:15 PM [facility] HVT DEVICE REMOTES [facility]  03/18/2024  1:15 PM [facility] HVT DEVICE REMOTES [facility]

## 2023-10-31 ENCOUNTER — Other Ambulatory Visit (HOSPITAL_COMMUNITY): Payer: Self-pay | Admitting: Emergency Medicine

## 2023-10-31 NOTE — Progress Notes (Signed)
 Remote ICD transmission.

## 2023-10-31 NOTE — Progress Notes (Signed)
 Paramedicine Encounter    Patient ID: Jeremy Powers, male    DOB: 1955-12-04, 68 y.o.   MRN: [id number]   Complaints NONE  Assessment A&O x 4, skin W&D w/ good color.  Denies chest pain or SOB.  Lung sounds clear throughout and no peripheral edema noted.  Compliance with meds YES  Pill box filled x 2 weeks  Refills needed Ferrous Sulfate   Meds changes since last visit NONE    Social changes NONE   BP 120/60 (BP Location: Left Arm, Patient Position: Sitting, Cuff Size: Normal)   Pulse 78   Resp 16   Wt 132 lb 9.6 oz (60.1 kg)   SpO2 96%   BMI 20.16 kg/m  Weight yesterday- not taken Last visit weight-124lb  ACTION: Home visit completed  Carlton Chick [phone] 11/06/23  Patient Care Team: Francine Iron as PCP - General (Internal Medicine) Julane Ny Rheta Celestine, MD as PCP - Cardiology (Cardiology)  Patient Active Problem List   Diagnosis Date Noted   Lipoma 12/18/2022   Biventricular ICD (implantable cardioverter-defibrillator) in place 09/04/2022   NICM (nonischemic cardiomyopathy) (HCC) 03/20/2022   Malnutrition of moderate degree 09/23/2021   Dyslipidemia 09/21/2021   Anemia 09/21/2021   AKI (acute kidney injury) (HCC) 09/21/2021   Essential hypertension 09/20/2021   CHF (congestive heart failure) (HCC) 05/08/2021   Acute on chronic systolic CHF (congestive heart failure) (HCC) 05/06/2021   Mitral regurgitation 12/26/2020   COVID-19 virus infection 12/24/2020   CHF exacerbation (HCC) 12/23/2020   Acute on chronic systolic HF (heart failure) (HCC) 06/24/2018   Aortic insufficiency 06/22/2018   Acute on chronic congestive heart failure (HCC) 06/22/2018   Elevated troponin 06/22/2018   CAD (coronary artery disease) 06/22/2018   Left bundle branch block 06/22/2018   History of iron  deficiency anemia 06/22/2018   Acute on chronic heart failure (HCC) 06/22/2018   Chronic bilateral low back pain without sciatica 12/12/2015   Presence of  aortocoronary bypass graft 12/12/2015   Iron  deficiency anemia 03/28/2015   Hyperlipidemia 10/12/2014    Current Outpatient Medications:    aspirin  EC 81 MG tablet, TAKE 1 TABLET (81 MG TOTAL) BY MOUTH DAILY. NEEDS FOLLOW UP APPOINTMENT FOR MORE REFILLS, Disp: 30 tablet, Rfl: 0   atorvastatin  (LIPITOR) 40 MG tablet, TAKE 1 TABLET (40 MG TOTAL) BY MOUTH DAILY. NEEDS FOLLOW UP APPOINTMENT FOR ANYMORE REFILLS, Disp: 90 tablet, Rfl: 0   carvedilol  (COREG ) 3.125 MG tablet, TAKE 1 TABLET (3.125 MG TOTAL) BY MOUTH TWO (TWO) TIMES DAILY. NEEDS APPOINTMENT FOR MORE REFILLS, Disp: 180 tablet, Rfl: 1   FARXIGA  10 MG TABS tablet, TAKE 1 TABLET (10 MG TOTAL) BY MOUTH DAILY WITH BREAKFAST. NEEDS FOLLOW UP APPOINTMENT FOR ANYMORE REFILLS, Disp: 90 tablet, Rfl: 1   FEROSUL 325 (65 Fe) MG tablet, TAKE 1 TABLET (325 MG TOTAL) BY MOUTH TWO (TWO) TIMES DAILY, Disp: 30 tablet, Rfl: 1   furosemide  (LASIX ) 40 MG tablet, TAKE 1 TABLET (40 MG TOTAL) BY MOUTH DAILY, Disp: 90 tablet, Rfl: 3   potassium chloride  SA (KLOR-CON  M) 20 MEQ tablet, TAKE ONE TABLET BY MOUTH DAILY, Disp: 90 tablet, Rfl: 0   sacubitril -valsartan  (ENTRESTO ) 49-51 MG, Take 1 tablet by mouth 2 (two) times daily., Disp: 180 tablet, Rfl: 3   spironolactone  (ALDACTONE ) 25 MG tablet, TAKE ONE TABLET BY MOUTH AT BEDTIME, Disp: 30 tablet, Rfl: 2 No Known Allergies   Social History   Socioeconomic History   Marital status: Single    Spouse name: Not on  file   Number of children: Not on file   Years of education: Not on file   Highest education level: Not on file  Occupational History   Not on file  Tobacco Use   Smoking status: Former    Current packs/day: 0.50    Types: Cigarettes   Smokeless tobacco: Never  Vaping Use   Vaping status: Never Used  Substance and Sexual Activity   Alcohol use: Yes    Comment: daily   Drug use: Yes    Types: Marijuana   Sexual activity: Not on file  Other Topics Concern   Not on file  Social History  Narrative   Not on file   Social Drivers of Health   Financial Resource Strain: Not on file  Food Insecurity: Food Insecurity Present (10/17/2021)   Hunger Vital Sign    Worried About Running Out of Food in the Last Year: Often true    Ran Out of Food in the Last Year: Sometimes true  Transportation Needs: Unmet Transportation Needs (09/04/2023)   PRAPARE - Administrator, Civil Service (Medical): Yes    Lack of Transportation (Non-Medical): Yes  Physical Activity: Not on file  Stress: Not on file  Social Connections: Not on file  Intimate Partner Violence: Not on file    Physical Exam      Future Appointments  Date Time Provider Department Center  12/18/2023  1:15 PM [facility] H[facility] DEVICE REMOTES [facility]  03/18/2024  1:15 PM [facility] HVT DEVICE REMOTES [facility]

## 2023-10-31 NOTE — Addendum Note (Signed)
 Addended by: Lott Rouleau A on: 10/31/2023 03:34 PM   Modules accepted: Orders

## 2023-11-15 ENCOUNTER — Other Ambulatory Visit (HOSPITAL_COMMUNITY): Payer: Self-pay | Admitting: Emergency Medicine

## 2023-11-15 NOTE — Progress Notes (Unsigned)
 Paramedicine Encounter    Patient ID: Jeremy Powers, male    DOB: 02/11/1956, 68 y.o.   MRN: [id number]   Complaints***  Assessment***  Compliance with meds***  Pill box filled***  Refills needed***  Meds changes since last visit***    Social changes***   BP (!) 140/60 (BP Location: Left Arm, Patient Position: Sitting, Cuff Size: Normal)   Pulse 68   Resp 16   Wt 135 lb 3.2 oz (61.3 kg)   SpO2 96%   BMI 20.56 kg/m  Weight yesterday- Last visit weight-132lb  ACTION: {Paramed Action:[phone]}  Mary Sharps, EMT-Paramedic [phone] 11/15/23  Patient Care Team: Dorina Dallas RIGGERS as PCP - General (Internal Medicine) Cherrie, Toribio SAUNDERS, MD as PCP - Cardiology (Cardiology)  Patient Active Problem List   Diagnosis Date Noted  . 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 Biventricular ICD (implantable cardioverter-defibrillator) in place 09/04/2022  . NICM (nonischemic cardiomyopathy) (HCC) 03/20/2022  . Malnutrition of moderate degree 09/23/2021  . Dyslipidemia 09/21/2021  . Anemia 09/21/2021  . AKI (acute kidney injury) (HCC) 09/21/2021  . Essential hypertension 09/20/2021  . CHF (congestive heart failure) (HCC) 05/08/2021  . Acute on chronic systolic CHF (congestive heart failure) (HCC) 05/06/2021  . Mitral regurgitation 12/26/2020  . COVID-19 virus infection 12/24/2020  . CHF exacerbation (HCC) 12/23/2020  . Acute on chronic systolic HF (heart failure) (HCC) 06/24/2018  . Aortic insufficiency 06/22/2018  . Acute on chronic congestive heart failure (HCC) 06/22/2018  . Elevated troponin 06/22/2018  . CAD (coronary artery disease) 06/22/2018  . Left bundle branch block 06/22/2018  . History of iron  deficiency anemia 06/22/2018  . Acute on chronic heart failure (HCC) 06/22/2018  . Chronic bilateral low back pain without sciatica 12/12/2015  . 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  . Iron  deficiency anemia 03/28/2015  . Hyperlipidemia 10/12/2014    Current  Outpatient Medications:  .  aspirin  EC 81 MG tablet, TAKE 1 TABLET (81 MG TOTAL) BY MOUTH DAILY. NEEDS FOLLOW UP APPOINTMENT FOR MORE REFILLS, Disp: 30 tablet, Rfl: 0 .  atorvastatin  (LIPITOR) 40 MG tablet, TAKE 1 TABLET (40 MG TOTAL) BY MOUTH DAILY. NEEDS FOLLOW UP APPOINTMENT FOR ANYMORE REFILLS, Disp: 90 tablet, Rfl: 0 .  carvedilol  (COREG ) 3.125 MG tablet, TAKE 1 TABLET (3.125 MG TOTAL) BY MOUTH TWO (TWO) TIMES DAILY. NEEDS APPOINTMENT FOR MORE REFILLS, Disp: 180 tablet, Rfl: 1 .  FARXIGA  10 MG TABS tablet, TAKE 1 TABLET (10 MG TOTAL) BY MOUTH DAILY WITH BREAKFAST. NEEDS FOLLOW UP APPOINTMENT FOR ANYMORE REFILLS, Disp: 90 tablet, Rfl: 1 .  FEROSUL 325 (65 Fe) MG tablet, TAKE 1 TABLET (325 MG TOTAL) BY MOUTH TWO (TWO) TIMES DAILY, Disp: 30 tablet, Rfl: 1 .  potassium chloride  SA (KLOR-CON  M) 20 MEQ tablet, TAKE ONE TABLET BY MOUTH DAILY, Disp: 90 tablet, Rfl: 0 .  sacubitril -valsartan  (ENTRESTO ) 49-51 MG, Take 1 tablet by mouth 2 (two) times daily., Disp: 180 tablet, Rfl: 3 .  spironolactone  (ALDACTONE ) 25 MG tablet, TAKE ONE TABLET BY MOUTH AT BEDTIME, Disp: 30 tablet, Rfl: 2 .  furosemide  (LASIX ) 40 MG tablet, TAKE 1 TABLET (40 MG TOTAL) BY MOUTH DAILY, Disp: 90 tablet, Rfl: 3 No Known Allergies   Social History   Socioeconomic History  . Marital status: Single    Spouse name: Not on file  . Number of children: Not on file  . Years of education: Not on file  . Highest education level: Not on file  Occupational History  . Not on file  Tobacco  Use  . Smoking status: Former    Current packs/day: 0.50    Types: Cigarettes  . Smokeless tobacco: Never  Vaping Use  . Vaping status: Never Used  Substance and Sexual Activity  . Alcohol use: Yes    Comment: daily  . Drug use: Yes    Types: Marijuana  . Sexual activity: Not on file  Other Topics Concern  . Not on file  Social History Narrative  . Not on file   Social Drivers of Health   Financial Resource Strain: Not on file   Food Insecurity: Food Insecurity Present (10/17/2021)   Hunger Vital Sign   . Worried About Programme researcher, broadcasting/film/video in the Last Year: Often true   . Ran Out of Food in the Last Year: Sometimes true  Transportation Needs: Unmet Transportation Needs (09/04/2023)   PRAPARE - Transportation   . Lack of Transportation (Medical): Yes   . Lack of Transportation (Non-Medical): Yes  Physical Activity: Not on file  Stress: Not on file  Social Connections: Not on file  Intimate Partner Violence: Not on file    Physical Exam      Future Appointments  Date Time Provider Department Center  12/18/2023  1:15 PM CVD HVT DEVICE REMOTES [facility]  03/18/2024  1:15 PM [facility] HVT DEVICE REMOTES [facility]

## 2023-11-27 ENCOUNTER — Other Ambulatory Visit (HOSPITAL_COMMUNITY): Payer: Self-pay | Admitting: Emergency Medicine

## 2023-11-27 NOTE — Progress Notes (Unsigned)
 Paramedicine Encounter    Patient ID: Jeremy Powers, male    DOB: [birth year], 68 y.o.   MRN: [id number]   Complaints NONE  Assessment***  Compliance with meds***  Pill box filled***  Refills needed***  Meds changes since last visit***    Social changes***   There were no vitals taken for this visit. Weight yesterday-not taken Last visit weight-135lb  ACTION: {Paramed Action:[phone]}  Mary Sharps, EMT-Paramedic [phone] 11/27/23  Patient Care Team: Dorina Dallas RIGGERS as PCP - General (Internal Medicine) Cherrie Toribio SAUNDERS, MD as PCP - Cardiology (Cardiology)  Patient Active Problem List   Diagnosis Date Noted   Lipoma 12/18/2022   Biventricular ICD (implantable cardioverter-defibrillator) in place 09/04/2022   NICM (nonischemic cardiomyopathy) (HCC) 03/20/2022   Malnutrition of moderate degree 09/23/2021   Dyslipidemia 09/21/2021   Anemia 09/21/2021   AKI (acute kidney injury) (HCC) 09/21/2021   Essential hypertension 09/20/2021   CHF (congestive heart failure) (HCC) 05/08/2021   Acute on chronic systolic CHF (congestive heart failure) (HCC) 05/06/2021   Mitral regurgitation 12/26/2020   COVID-19 virus infection 12/24/2020   CHF exacerbation (HCC) 12/23/2020   Acute on chronic systolic HF (heart failure) (HCC) 06/24/2018   Aortic insufficiency 06/22/2018   Acute on chronic congestive heart failure (HCC) 06/22/2018   Elevated troponin 06/22/2018   CAD (coronary artery disease) 06/22/2018   Left bundle branch block 06/22/2018   History of iron  deficiency anemia 06/22/2018   Acute on chronic heart failure (HCC) 06/22/2018   Chronic bilateral low back pain without sciatica 12/12/2015   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   Iron  deficiency anemia 03/28/2015   Hyperlipidemia 10/12/2014    Current Outpatient Medications:    aspirin  EC 81 MG tablet, TAKE 1 TABLET (81 MG TOTAL) BY MOUTH DAILY. NEEDS FOLLOW UP APPOINTMENT FOR MORE REFILLS,  Disp: 30 tablet, Rfl: 0   atorvastatin  (LIPITOR) 40 MG tablet, TAKE 1 TABLET (40 MG TOTAL) BY MOUTH DAILY. NEEDS FOLLOW UP APPOINTMENT FOR ANYMORE REFILLS, Disp: 90 tablet, Rfl: 0   carvedilol  (COREG ) 3.125 MG tablet, TAKE 1 TABLET (3.125 MG TOTAL) BY MOUTH TWO (TWO) TIMES DAILY. NEEDS APPOINTMENT FOR MORE REFILLS, Disp: 180 tablet, Rfl: 1   FARXIGA  10 MG TABS tablet, TAKE 1 TABLET (10 MG TOTAL) BY MOUTH DAILY WITH BREAKFAST. NEEDS FOLLOW UP APPOINTMENT FOR ANYMORE REFILLS, Disp: 90 tablet, Rfl: 1   FEROSUL 325 (65 Fe) MG tablet, TAKE 1 TABLET (325 MG TOTAL) BY MOUTH TWO (TWO) TIMES DAILY, Disp: 30 tablet, Rfl: 1   furosemide  (LASIX ) 40 MG tablet, TAKE 1 TABLET (40 MG TOTAL) BY MOUTH DAILY, Disp: 90 tablet, Rfl: 3   potassium chloride  SA (KLOR-CON  M) 20 MEQ tablet, TAKE ONE TABLET BY MOUTH DAILY, Disp: 90 tablet, Rfl: 0   sacubitril -valsartan  (ENTRESTO ) 49-51 MG, Take 1 tablet by mouth 2 (two) times daily., Disp: 180 tablet, Rfl: 3   spironolactone  (ALDACTONE ) 25 MG tablet, TAKE ONE TABLET BY MOUTH AT BEDTIME, Disp: 30 tablet, Rfl: 2 No Known Allergies   Social History   Socioeconomic History   Marital status: Single    Spouse name: Not on file   Number of children: Not on file   Years of education: Not on file   Highest education level: Not on file  Occupational History   Not on file  Tobacco Use   Smoking status: Former    Current packs/day: 0.50    Types: Cigarettes   Smokeless tobacco: Never  Vaping Use   Vaping status: Never  Used  Substance and Sexual Activity   Alcohol use: Yes    Comment: daily   Drug use: Yes    Types: Marijuana   Sexual activity: Not on file  Other Topics Concern   Not on file  Social History Narrative   Not on file   Social Drivers of Health   Financial Resource Strain: Not on file  Food Insecurity: Food Insecurity Present (10/17/2021)   Hunger Vital Sign    Worried About Running Out of Food in the Last Year: Often true    Ran Out of Food in the  Last Year: Sometimes true  Transportation Needs: Unmet Transportation Needs (09/04/2023)   PRAPARE - Administrator, Civil Service (Medical): Yes    Lack of Transportation (Non-Medical): Yes  Physical Activity: Not on file  Stress: Not on file  Social Connections: Not on file  Intimate Partner Violence: Not on file    Physical Exam      Future Appointments  Date Time Provider Department Center  12/18/2023  1:15 PM [facility] HVT DEVICE REMOTES [facility]  03/18/2024  1:15 PM [facility] HVT DEVICE REMOTES [facility]

## 2023-12-11 ENCOUNTER — Other Ambulatory Visit (HOSPITAL_COMMUNITY): Payer: Self-pay | Admitting: Emergency Medicine

## 2023-12-11 NOTE — Progress Notes (Signed)
 Paramedicine Encounter    Patient ID: MCCABE GLORIA, male    DOB: 22-Sep-1955, 68 y.o.   MRN: [id number]   Complaints Leg and Arm cramps on and off  Assessment A&O x 4, skin W&D w/ good color.  Denies chest pain or SOB.  Lung sounds clear bilat and no peripheral edema noted.  Compliance with meds Yes   Pill box filled x 2  weeks  Refills needed NONE  Meds changes since last visit NONE    Social changes NONE   BP (!) 150/80 (BP Location: Left Arm, Patient Position: Standing, Cuff Size: Normal)   Pulse 93   Resp 16   Wt 124 lb 12.8 oz (56.6 kg)   SpO2 97%   BMI 18.98 kg/m  Weight yesterday- Last visit weight-131lb  ACTION: Home visit completed  [name] [phone] 12/11/23  Patient Care Team: Dorina Dallas RIGGERS as PCP - General (Internal Medicine) Cherrie Toribio SAUNDERS, MD as PCP - Cardiology (Cardiology)  Patient Active Problem List   Diagnosis Date Noted   Lipoma 12/18/2022   Biventricular ICD (implantable cardioverter-defibrillator) in place 09/04/2022   NICM (nonischemic cardiomyopathy) (HCC) 03/20/2022   Malnutrition of moderate degree 09/23/2021   Dyslipidemia 09/21/2021   Anemia 09/21/2021   AKI (acute kidney injury) (HCC) 09/21/2021   Essential hypertension 09/20/2021   CHF (congestive heart failure) (HCC) 05/08/2021   Acute on chronic systolic CHF (congestive heart failure) (HCC) 05/06/2021   Mitral regurgitation 12/26/2020   COVID-19 virus infection 12/24/2020   CHF exacerbation (HCC) 12/23/2020   Acute on chronic systolic HF (heart failure) (HCC) 06/24/2018   Aortic insufficiency 06/22/2018   Acute on chronic congestive heart failure (HCC) 06/22/2018   Elevated troponin 06/22/2018   CAD (coronary artery disease) 06/22/2018   Left bundle branch block 06/22/2018   History of iron  deficiency anemia 06/22/2018   Acute on chronic heart failure (HCC) 06/22/2018   Chronic bilateral low back pain without sciatica 12/12/2015    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   Iron  deficiency anemia 03/28/2015   Hyperlipidemia 10/12/2014    Current Outpatient Medications:    aspirin  EC 81 MG tablet, TAKE 1 TABLET (81 MG TOTAL) BY MOUTH DAILY. NEEDS FOLLOW UP APPOINTMENT FOR MORE REFILLS, Disp: 30 tablet, Rfl: 0   atorvastatin  (LIPITOR) 40 MG tablet, TAKE 1 TABLET (40 MG TOTAL) BY MOUTH DAILY. NEEDS FOLLOW UP APPOINTMENT FOR ANYMORE REFILLS, Disp: 90 tablet, Rfl: 0   carvedilol  (COREG ) 3.125 MG tablet, TAKE 1 TABLET (3.125 MG TOTAL) BY MOUTH TWO (TWO) TIMES DAILY. NEEDS APPOINTMENT FOR MORE REFILLS, Disp: 180 tablet, Rfl: 1   FARXIGA  10 MG TABS tablet, TAKE 1 TABLET (10 MG TOTAL) BY MOUTH DAILY WITH BREAKFAST. NEEDS FOLLOW UP APPOINTMENT FOR ANYMORE REFILLS, Disp: 90 tablet, Rfl: 1   FEROSUL 325 (65 Fe) MG tablet, TAKE 1 TABLET (325 MG TOTAL) BY MOUTH TWO (TWO) TIMES DAILY, Disp: 30 tablet, Rfl: 1   furosemide  (LASIX ) 40 MG tablet, TAKE 1 TABLET (40 MG TOTAL) BY MOUTH DAILY, Disp: 90 tablet, Rfl: 3   potassium chloride  SA (KLOR-CON  M) 20 MEQ tablet, TAKE ONE TABLET BY MOUTH DAILY, Disp: 90 tablet, Rfl: 0   sacubitril -valsartan  (ENTRESTO ) 49-51 MG, Take 1 tablet by mouth 2 (two) times daily., Disp: 180 tablet, Rfl: 3   spironolactone  (ALDACTONE ) 25 MG tablet, TAKE ONE TABLET BY MOUTH AT BEDTIME, Disp: 30 tablet, Rfl: 2 No Known Allergies   Social History   Socioeconomic History   Marital status: Single  Spouse name: Not on file   Number of children: Not on file   Years of education: Not on file   Highest education level: Not on file  Occupational History   Not on file  Tobacco Use   Smoking status: Former    Current packs/day: 0.50    Types: Cigarettes   Smokeless tobacco: Never  Vaping Use   Vaping status: Never Used  Substance and Sexual Activity   Alcohol use: Yes    Comment: daily   Drug use: Yes    Types: Marijuana   Sexual activity: Not on file  Other Topics Concern   Not on file  Social  History Narrative   Not on file   Social Drivers of Health   Financial Resource Strain: Not on file  Food Insecurity: Food Insecurity Present (10/17/2021)   Hunger Vital Sign    Worried About Running Out of Food in the Last Year: Often true    Ran Out of Food in the Last Year: Sometimes true  Transportation Needs: Unmet Transportation Needs (09/04/2023)   PRAPARE - Administrator, Civil Service (Medical): Yes    Lack of Transportation (Non-Medical): Yes  Physical Activity: Not on file  Stress: Not on file  Social Connections: Not on file  Intimate Partner Violence: Not on file    Physical Exam      Future Appointments  Date Time Provider Department Center  12/18/2023  1:15 PM [facility] HVT DEVICE REMOTES [facility]  03/18/2024  1:15 PM [facility] HVT DEVICE REMOTES [facility]

## 2023-12-18 ENCOUNTER — Ambulatory Visit: Payer: 59

## 2023-12-18 DIAGNOSIS — I428 Other cardiomyopathies: Secondary | ICD-10-CM | POA: Diagnosis not present

## 2023-12-18 LAB — CUP PACEART REMOTE DEVICE CHECK
Battery Remaining Longevity: 126 mo
Battery Remaining Percentage: 100 %
Brady Statistic RA Percent Paced: 1 %
Brady Statistic RV Percent Paced: 39 %
Date Time Interrogation Session: 20250730002000
HighPow Impedance: 71 Ohm
Implantable Lead Connection Status: 753985
Implantable Lead Connection Status: 753985
Implantable Lead Connection Status: 753985
Implantable Lead Implant Date: 20240108
Implantable Lead Implant Date: 20240108
Implantable Lead Implant Date: 20240108
Implantable Lead Location: 753858
Implantable Lead Location: 753859
Implantable Lead Location: 753860
Implantable Lead Model: 137
Implantable Lead Model: 4671
Implantable Lead Model: 7841
Implantable Lead Serial Number: 1396493
Implantable Lead Serial Number: 301415
Implantable Lead Serial Number: 881901
Implantable Pulse Generator Implant Date: 20240108
Lead Channel Impedance Value: 483 Ohm
Lead Channel Impedance Value: 517 Ohm
Lead Channel Impedance Value: 552 Ohm
Lead Channel Pacing Threshold Amplitude: 0.4 V
Lead Channel Pacing Threshold Amplitude: 0.9 V
Lead Channel Pacing Threshold Amplitude: 1 V
Lead Channel Pacing Threshold Pulse Width: 0.4 ms
Lead Channel Pacing Threshold Pulse Width: 0.4 ms
Lead Channel Pacing Threshold Pulse Width: 0.4 ms
Lead Channel Setting Pacing Amplitude: 2.5 V
Lead Channel Setting Pacing Amplitude: 2.5 V
Lead Channel Setting Pacing Amplitude: 2.5 V
Lead Channel Setting Pacing Pulse Width: 0.4 ms
Lead Channel Setting Pacing Pulse Width: 0.4 ms
Lead Channel Setting Sensing Sensitivity: 0.5 mV
Lead Channel Setting Sensing Sensitivity: 1 mV
Pulse Gen Serial Number: 394480

## 2023-12-20 ENCOUNTER — Ambulatory Visit: Payer: Self-pay | Admitting: Internal Medicine

## 2023-12-26 ENCOUNTER — Other Ambulatory Visit (HOSPITAL_COMMUNITY): Payer: Self-pay | Admitting: Emergency Medicine

## 2023-12-26 NOTE — Progress Notes (Unsigned)
 Paramedicine Encounter    Patient ID: Jeremy Powers, male    DOB: June 11, 1955, 68 y.o.   MRN: [id number]   Complaints***  Assessment***  Compliance with meds***  Pill box filled***  Refills needed***  Meds changes since last visit***    Social changes***   There were no vitals taken for this visit. Weight yesterday-*** Last visit weight-***  ACTION: {Paramed Action:[phone]}  Mary Sharps, EMT-Paramedic [phone] 12/26/23  Patient Care Team: Dorina Dallas RIGGERS as PCP - General (Internal Medicine) Bensimhon, Toribio SAUNDERS, MD as PCP - Cardiology (Cardiology)  Patient Active Problem List   Diagnosis Date Noted  . 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 Biventricular ICD (implantable cardioverter-defibrillator) in place 09/04/2022  . NICM (nonischemic cardiomyopathy) (HCC) 03/20/2022  . Malnutrition of moderate degree 09/23/2021  . Dyslipidemia 09/21/2021  . Anemia 09/21/2021  . AKI (acute kidney injury) (HCC) 09/21/2021  . Essential hypertension 09/20/2021  . CHF (congestive heart failure) (HCC) 05/08/2021  . Acute on chronic systolic CHF (congestive heart failure) (HCC) 05/06/2021  . Mitral regurgitation 12/26/2020  . COVID-19 virus infection 12/24/2020  . CHF exacerbation (HCC) 12/23/2020  . Acute on chronic systolic HF (heart failure) (HCC) 06/24/2018  . Aortic insufficiency 06/22/2018  . Acute on chronic congestive heart failure (HCC) 06/22/2018  . Elevated troponin 06/22/2018  . CAD (coronary artery disease) 06/22/2018  . Left bundle branch block 06/22/2018  . History of iron  deficiency anemia 06/22/2018  . Acute on chronic heart failure (HCC) 06/22/2018  . Chronic bilateral low back pain without sciatica 12/12/2015  . 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  . Iron  deficiency anemia 03/28/2015  . Hyperlipidemia 10/12/2014    Current Outpatient Medications:  .  aspirin  EC 81 MG tablet, TAKE 1 TABLET (81 MG TOTAL) BY MOUTH DAILY. NEEDS FOLLOW UP APPOINTMENT  FOR MORE REFILLS, Disp: 30 tablet, Rfl: 0 .  atorvastatin  (LIPITOR) 40 MG tablet, TAKE 1 TABLET (40 MG TOTAL) BY MOUTH DAILY. NEEDS FOLLOW UP APPOINTMENT FOR ANYMORE REFILLS, Disp: 90 tablet, Rfl: 0 .  carvedilol  (COREG ) 3.125 MG tablet, TAKE 1 TABLET (3.125 MG TOTAL) BY MOUTH TWO (TWO) TIMES DAILY. NEEDS APPOINTMENT FOR MORE REFILLS, Disp: 180 tablet, Rfl: 1 .  FARXIGA  10 MG TABS tablet, TAKE 1 TABLET (10 MG TOTAL) BY MOUTH DAILY WITH BREAKFAST. NEEDS FOLLOW UP APPOINTMENT FOR ANYMORE REFILLS, Disp: 90 tablet, Rfl: 1 .  FEROSUL 325 (65 Fe) MG tablet, TAKE 1 TABLET (325 MG TOTAL) BY MOUTH TWO (TWO) TIMES DAILY, Disp: 30 tablet, Rfl: 1 .  furosemide  (LASIX ) 40 MG tablet, TAKE 1 TABLET (40 MG TOTAL) BY MOUTH DAILY, Disp: 90 tablet, Rfl: 3 .  potassium chloride  SA (KLOR-CON  M) 20 MEQ tablet, TAKE ONE TABLET BY MOUTH DAILY, Disp: 90 tablet, Rfl: 0 .  sacubitril -valsartan  (ENTRESTO ) 49-51 MG, Take 1 tablet by mouth 2 (two) times daily., Disp: 180 tablet, Rfl: 3 .  spironolactone  (ALDACTONE ) 25 MG tablet, TAKE ONE TABLET BY MOUTH AT BEDTIME, Disp: 30 tablet, Rfl: 2 No Known Allergies   Social History   Socioeconomic History  . Marital status: Single    Spouse name: Not on file  . Number of children: Not on file  . Years of education: Not on file  . Highest education level: Not on file  Occupational History  . Not on file  Tobacco Use  . Smoking status: Former    Current packs/day: 0.50    Types: Cigarettes  . Smokeless tobacco: Never  Vaping Use  . Vaping status: Never Used  Substance and Sexual Activity  . Alcohol use: Yes    Comment: daily  . Drug use: Yes    Types: Marijuana  . Sexual activity: Not on file  Other Topics Concern  . Not on file  Social History Narrative  . Not on file   Social Drivers of Health   Financial Resource Strain: Not on file  Food Insecurity: Food Insecurity Present (10/17/2021)   Hunger Vital Sign   . Worried About Programme researcher, broadcasting/film/video in the Last  Year: Often true   . Ran Out of Food in the Last Year: Sometimes true  Transportation Needs: Unmet Transportation Needs (09/04/2023)   PRAPARE - Transportation   . Lack of Transportation (Medical): Yes   . Lack of Transportation (Non-Medical): Yes  Physical Activity: Not on file  Stress: Not on file  Social Connections: Not on file  Intimate Partner Violence: Not on file    Physical Exam      Future Appointments  Date Time Provider Department Center  03/18/2024  1:15 PM [facility] HVT DEVICE REMOTES [facility]

## 2024-01-16 ENCOUNTER — Other Ambulatory Visit (HOSPITAL_COMMUNITY): Payer: Self-pay | Admitting: Emergency Medicine

## 2024-01-16 NOTE — Progress Notes (Unsigned)
 Paramedicine Encounter    Patient ID: Jeremy Powers, male    DOB: 24-Mar-1956, 68 y.o.   MRN: [id number]   Complaints***  Assessment***  Compliance with meds***  Pill box filled x 2 weeks  Refills needed Entresto  49-51  Meds changes since last visit***    Social changes***   BP 120/60 (BP Location: Left Arm, Patient Position: Sitting, Cuff Size: Normal)   Pulse 70   SpO2 97%  Weight yesterday-*** Last visit weight-***  ACTION: {Paramed [phone]}  Mary Sharps, EMT-Paramedic [phone] 01/16/24  Patient Care Team: Dorina Dallas RIGGERS as PCP - General (Internal Medicine) Cherrie, Toribio SAUNDERS, MD as PCP - Cardiology (Cardiology)  Patient Active Problem List   Diagnosis Date Noted  . 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 Biventricular ICD (implantable cardioverter-defibrillator) in place 09/04/2022  . NICM (nonischemic cardiomyopathy) (HCC) 03/20/2022  . Malnutrition of moderate degree 09/23/2021  . Dyslipidemia 09/21/2021  . Anemia 09/21/2021  . AKI (acute kidney injury) (HCC) 09/21/2021  . Essential hypertension 09/20/2021  . CHF (congestive heart failure) (HCC) 05/08/2021  . Acute on chronic systolic CHF (congestive heart failure) (HCC) 05/06/2021  . Mitral regurgitation 12/26/2020  . COVID-19 virus infection 12/24/2020  . CHF exacerbation (HCC) 12/23/2020  . Acute on chronic systolic HF (heart failure) (HCC) 06/24/2018  . Aortic insufficiency 06/22/2018  . Acute on chronic congestive heart failure (HCC) 06/22/2018  . Elevated troponin 06/22/2018  . CAD (coronary artery disease) 06/22/2018  . Left bundle branch block 06/22/2018  . History of iron  deficiency anemia 06/22/2018  . Acute on chronic heart failure (HCC) 06/22/2018  . Chronic bilateral low back pain without sciatica 12/12/2015  . 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  . Iron  deficiency anemia 03/28/2015  . Hyperlipidemia 10/12/2014    Current Outpatient Medications:  .  atorvastatin   (LIPITOR) 40 MG tablet, TAKE 1 TABLET (40 MG TOTAL) BY MOUTH DAILY. NEEDS FOLLOW UP APPOINTMENT FOR ANYMORE REFILLS, Disp: 90 tablet, Rfl: 0 .  carvedilol  (COREG ) 3.125 MG tablet, TAKE 1 TABLET (3.125 MG TOTAL) BY MOUTH TWO (TWO) TIMES DAILY. NEEDS APPOINTMENT FOR MORE REFILLS, Disp: 180 tablet, Rfl: 1 .  FARXIGA  10 MG TABS tablet, TAKE 1 TABLET (10 MG TOTAL) BY MOUTH DAILY WITH BREAKFAST. NEEDS FOLLOW UP APPOINTMENT FOR ANYMORE REFILLS, Disp: 90 tablet, Rfl: 1 .  FEROSUL 325 (65 Fe) MG tablet, TAKE 1 TABLET (325 MG TOTAL) BY MOUTH TWO (TWO) TIMES DAILY, Disp: 30 tablet, Rfl: 1 .  furosemide  (LASIX ) 40 MG tablet, TAKE 1 TABLET (40 MG TOTAL) BY MOUTH DAILY, Disp: 90 tablet, Rfl: 3 .  potassium chloride  SA (KLOR-CON  M) 20 MEQ tablet, TAKE ONE TABLET BY MOUTH DAILY, Disp: 90 tablet, Rfl: 0 .  sacubitril -valsartan  (ENTRESTO ) 49-51 MG, Take 1 tablet by mouth 2 (two) times daily., Disp: 180 tablet, Rfl: 3 .  spironolactone  (ALDACTONE ) 25 MG tablet, TAKE ONE TABLET BY MOUTH AT BEDTIME, Disp: 30 tablet, Rfl: 2 .  aspirin  EC 81 MG tablet, TAKE 1 TABLET (81 MG TOTAL) BY MOUTH DAILY. NEEDS FOLLOW UP APPOINTMENT FOR MORE REFILLS, Disp: 30 tablet, Rfl: 0 No Known Allergies   Social History   Socioeconomic History  . Marital status: Single    Spouse name: Not on file  . Number of children: Not on file  . Years of education: Not on file  . Highest education level: Not on file  Occupational History  . Not on file  Tobacco Use  . Smoking status: Former    Current packs/day: 0.50  Types: Cigarettes  . Smokeless tobacco: Never  Vaping Use  . Vaping status: Never Used  Substance and Sexual Activity  . Alcohol use: Yes    Comment: daily  . Drug use: Yes    Types: Marijuana  . Sexual activity: Not on file  Other Topics Concern  . Not on file  Social History Narrative  . Not on file   Social Drivers of Health   Financial Resource Strain: Not on file  Food Insecurity: Food Insecurity Present  (10/17/2021)   Hunger Vital Sign   . Worried About Programme researcher, broadcasting/film/video in the Last Year: Often true   . Ran Out of Food in the Last Year: Sometimes true  Transportation Needs: Unmet Transportation Needs (09/04/2023)   PRAPARE - Transportation   . Lack of Transportation (Medical): Yes   . Lack of Transportation (Non-Medical): Yes  Physical Activity: Not on file  Stress: Not on file  Social Connections: Not on file  Intimate Partner Violence: Not on file    Physical Exam      Future Appointments  Date Time Provider Department Center  03/18/2024  1:15 PM [facility] H[facility] DEVICE REMOTES [facility]

## 2024-01-29 ENCOUNTER — Other Ambulatory Visit (HOSPITAL_COMMUNITY): Payer: Self-pay | Admitting: Emergency Medicine

## 2024-01-29 NOTE — Progress Notes (Signed)
 Paramedicine Encounter    Patient ID: Jeremy Powers, male    DOB: March 06, 1956, 68 y.o.   MRN: [id number]   Complaints NONE  Assessment  A&O x 4, skin W&D w good color.  Denies chest pain or SOB.  Lung sounds clear and equal bilat. No peripheral edema noted  Compliance with meds No- missed 2 pm doses and 1 am dose.  Pill box filled x 2 weeks  Refills needed NONE  Meds changes since last visit NONE    Social changes NONE   BP 130/70 (BP Location: Left Arm, Patient Position: Sitting, Cuff Size: Normal)   Pulse 70   Resp 16   SpO2 96%  Weight yesterday- not taken Last visit weight- not taken-scale broken  ACTION: Home visit completed  [name] [phone] 01/29/24  Patient Care Team: Dorina Dallas RIGGERS as PCP - General (Internal Medicine) Cherrie Toribio SAUNDERS, MD as PCP - Cardiology (Cardiology)  Patient Active Problem List   Diagnosis Date Noted   Lipoma 12/18/2022   Biventricular ICD (implantable cardioverter-defibrillator) in place 09/04/2022   NICM (nonischemic cardiomyopathy) (HCC) 03/20/2022   Malnutrition of moderate degree 09/23/2021   Dyslipidemia 09/21/2021   Anemia 09/21/2021   AKI (acute kidney injury) (HCC) 09/21/2021   Essential hypertension 09/20/2021   CHF (congestive heart failure) (HCC) 05/08/2021   Acute on chronic systolic CHF (congestive heart failure) (HCC) 05/06/2021   Mitral regurgitation 12/26/2020   COVID-19 virus infection 12/24/2020   CHF exacerbation (HCC) 12/23/2020   Acute on chronic systolic HF (heart failure) (HCC) 06/24/2018   Aortic insufficiency 06/22/2018   Acute on chronic congestive heart failure (HCC) 06/22/2018   Elevated troponin 06/22/2018   CAD (coronary artery disease) 06/22/2018   Left bundle branch block 06/22/2018   History of iron  deficiency anemia 06/22/2018   Acute on chronic heart failure (HCC) 06/22/2018   Chronic bilateral low back pain without sciatica 12/12/2015   Presence of  aortocoronary bypass graft 12/12/2015   Iron  deficiency anemia 03/28/2015   Hyperlipidemia 10/12/2014    Current Outpatient Medications:    aspirin  EC 81 MG tablet, TAKE 1 TABLET (81 MG TOTAL) BY MOUTH DAILY. NEEDS FOLLOW UP APPOINTMENT FOR MORE REFILLS, Disp: 30 tablet, Rfl: 0   atorvastatin  (LIPITOR) 40 MG tablet, TAKE 1 TABLET (40 MG TOTAL) BY MOUTH DAILY. NEEDS FOLLOW UP APPOINTMENT FOR ANYMORE REFILLS, Disp: 90 tablet, Rfl: 0   carvedilol  (COREG ) 3.125 MG tablet, TAKE 1 TABLET (3.125 MG TOTAL) BY MOUTH TWO (TWO) TIMES DAILY. NEEDS APPOINTMENT FOR MORE REFILLS, Disp: 180 tablet, Rfl: 1   FARXIGA  10 MG TABS tablet, TAKE 1 TABLET (10 MG TOTAL) BY MOUTH DAILY WITH BREAKFAST. NEEDS FOLLOW UP APPOINTMENT FOR ANYMORE REFILLS, Disp: 90 tablet, Rfl: 1   FEROSUL 325 (65 Fe) MG tablet, TAKE 1 TABLET (325 MG TOTAL) BY MOUTH TWO (TWO) TIMES DAILY, Disp: 30 tablet, Rfl: 1   furosemide  (LASIX ) 40 MG tablet, TAKE 1 TABLET (40 MG TOTAL) BY MOUTH DAILY, Disp: 90 tablet, Rfl: 3   potassium chloride  SA (KLOR-CON  M) 20 MEQ tablet, TAKE ONE TABLET BY MOUTH DAILY, Disp: 90 tablet, Rfl: 0   sacubitril -valsartan  (ENTRESTO ) 49-51 MG, Take 1 tablet by mouth 2 (two) times daily., Disp: 180 tablet, Rfl: 3   spironolactone  (ALDACTONE ) 25 MG tablet, TAKE ONE TABLET BY MOUTH AT BEDTIME, Disp: 30 tablet, Rfl: 2 No Known Allergies   Social History   Socioeconomic History   Marital status: Single    Spouse name: Not on file  Number of children: Not on file   Years of education: Not on file   Highest education level: Not on file  Occupational History   Not on file  Tobacco Use   Smoking status: Former    Current packs/day: 0.50    Types: Cigarettes   Smokeless tobacco: Never  Vaping Use   Vaping status: Never Used  Substance and Sexual Activity   Alcohol use: Yes    Comment: daily   Drug use: Yes    Types: Marijuana   Sexual activity: Not on file  Other Topics Concern   Not on file  Social History  Narrative   Not on file   Social Drivers of Health   Financial Resource Strain: Not on file  Food Insecurity: Food Insecurity Present (10/17/2021)   Hunger Vital Sign    Worried About Running Out of Food in the Last Year: Often true    Ran Out of Food in the Last Year: Sometimes true  Transportation Needs: Unmet Transportation Needs (09/04/2023)   PRAPARE - Administrator, Civil Service (Medical): Yes    Lack of Transportation (Non-Medical): Yes  Physical Activity: Not on file  Stress: Not on file  Social Connections: Not on file  Intimate Partner Violence: Not on file    Physical Exam      Future Appointments  Date Time Provider Department Center  03/18/2024  1:15 PM [facility] HVT DEVICE REMOTES [facility]

## 2024-02-13 ENCOUNTER — Other Ambulatory Visit (HOSPITAL_COMMUNITY): Payer: Self-pay | Admitting: Emergency Medicine

## 2024-02-13 NOTE — Progress Notes (Unsigned)
 Paramedicine Encounter    Patient ID: Jeremy Powers, male    DOB: October 29, 1955, 68 y.o.   MRN: [id number]   Complaints Leg cramps  Assessment A&O x 4, skin W&D w/ good color  Compliance with meds No- missed a couple doses  Pill box filled x 2 weeks  Refills needed NONE  Meds changes since last visit NONE    Social changes NONE   BP 122/60 (BP Location: Left Arm, Patient Position: Sitting, Cuff Size: Normal)   Pulse 62   Resp 18   Wt 126 lb 6.4 oz (57.3 kg)   SpO2 96%   BMI 19.22 kg/m  Weight yesterday-not taken Last visit weight-128.6lb  ATF Mr. Burridge A&O x 4, skin W&D w/ good color.  Denies chest pain or SOB.  Lung sounds clear throughout.  No peripheral edema noted.  Odor of ETOH on breath w/ admitted having a beer earlier.   Provided pt a new pill box and reconciled meds x 2 weeks. Also provided a new scale.  Called and scheduled visit w/ H&V for 10/7 @ 9:30.    ACTION: Home visit completed  [name] [phone] 02/13/24  Patient Care Team: Dorina Dallas RIGGERS as PCP - General (Internal Medicine) Cherrie Toribio SAUNDERS, MD as PCP - Cardiology (Cardiology)  Patient Active Problem List   Diagnosis Date Noted   Lipoma 12/18/2022   Biventricular ICD (implantable cardioverter-defibrillator) in place 09/04/2022   NICM (nonischemic cardiomyopathy) (HCC) 03/20/2022   Malnutrition of moderate degree 09/23/2021   Dyslipidemia 09/21/2021   Anemia 09/21/2021   AKI (acute kidney injury) 09/21/2021   Essential hypertension 09/20/2021   CHF (congestive heart failure) (HCC) 05/08/2021   Acute on chronic systolic CHF (congestive heart failure) (HCC) 05/06/2021   Mitral regurgitation 12/26/2020   COVID-19 virus infection 12/24/2020   CHF exacerbation (HCC) 12/23/2020   Acute on chronic systolic HF (heart failure) (HCC) 06/24/2018   Aortic insufficiency 06/22/2018   Acute on chronic congestive heart failure (HCC) 06/22/2018   Elevated troponin 06/22/2018    CAD (coronary artery disease) 06/22/2018   Left bundle branch block 06/22/2018   History of iron  deficiency anemia 06/22/2018   Acute on chronic heart failure (HCC) 06/22/2018   Chronic bilateral low back pain without sciatica 12/12/2015   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   Iron  deficiency anemia 03/28/2015   Hyperlipidemia 10/12/2014    Current Outpatient Medications:    aspirin  EC 81 MG tablet, TAKE 1 TABLET (81 MG TOTAL) BY MOUTH DAILY. NEEDS FOLLOW UP APPOINTMENT FOR MORE REFILLS, Disp: 30 tablet, Rfl: 0   atorvastatin  (LIPITOR) 40 MG tablet, TAKE 1 TABLET (40 MG TOTAL) BY MOUTH DAILY. NEEDS FOLLOW UP APPOINTMENT FOR ANYMORE REFILLS, Disp: 90 tablet, Rfl: 0   carvedilol  (COREG ) 3.125 MG tablet, TAKE 1 TABLET (3.125 MG TOTAL) BY MOUTH TWO (TWO) TIMES DAILY. NEEDS APPOINTMENT FOR MORE REFILLS, Disp: 180 tablet, Rfl: 1   FARXIGA  10 MG TABS tablet, TAKE 1 TABLET (10 MG TOTAL) BY MOUTH DAILY WITH BREAKFAST. NEEDS FOLLOW UP APPOINTMENT FOR ANYMORE REFILLS, Disp: 90 tablet, Rfl: 1   FEROSUL 325 (65 Fe) MG tablet, TAKE 1 TABLET (325 MG TOTAL) BY MOUTH TWO (TWO) TIMES DAILY, Disp: 30 tablet, Rfl: 1   furosemide  (LASIX ) 40 MG tablet, TAKE 1 TABLET (40 MG TOTAL) BY MOUTH DAILY, Disp: 90 tablet, Rfl: 3   potassium chloride  SA (KLOR-CON  M) 20 MEQ tablet, TAKE ONE TABLET BY MOUTH DAILY, Disp: 90 tablet, Rfl: 0   sacubitril -valsartan  (ENTRESTO )  49-51 MG, Take 1 tablet by mouth 2 (two) times daily., Disp: 180 tablet, Rfl: 3   spironolactone  (ALDACTONE ) 25 MG tablet, TAKE ONE TABLET BY MOUTH AT BEDTIME, Disp: 30 tablet, Rfl: 2 No Known Allergies   Social History   Socioeconomic History   Marital status: Single    Spouse name: Not on file   Number of children: Not on file   Years of education: Not on file   Highest education level: Not on file  Occupational History   Not on file  Tobacco Use   Smoking status: Former    Current packs/day: 0.50    Types: Cigarettes    Smokeless tobacco: Never  Vaping Use   Vaping status: Never Used  Substance and Sexual Activity   Alcohol use: Yes    Comment: daily   Drug use: Yes    Types: Marijuana   Sexual activity: Not on file  Other Topics Concern   Not on file  Social History Narrative   Not on file   Social Drivers of Health   Financial Resource Strain: Not on file  Food Insecurity: Food Insecurity Present (10/17/2021)   Hunger Vital Sign    Worried About Running Out of Food in the Last Year: Often true    Ran Out of Food in the Last Year: Sometimes true  Transportation Needs: Unmet Transportation Needs (09/04/2023)   PRAPARE - Administrator, Civil Service (Medical): Yes    Lack of Transportation (Non-Medical): Yes  Physical Activity: Not on file  Stress: Not on file  Social Connections: Not on file  Intimate Partner Violence: Not on file    Physical Exam      Future Appointments  Date Time Provider Department Center  03/18/2024  1:15 PM [facility] H[facility] DEVICE REMOTES [facility]

## 2024-02-19 NOTE — Progress Notes (Signed)
 Remote ICD Transmission

## 2024-02-24 ENCOUNTER — Telehealth (HOSPITAL_COMMUNITY): Payer: Self-pay | Admitting: Licensed Clinical Social Worker

## 2024-02-24 NOTE — Telephone Encounter (Signed)
 H&V Care Navigation CSW Progress Note  Clinical Social Worker informed by paramedic that pt in need of transportation assistance to clinic appt tomorrow.  [employer] taxi arranged for 8:45am pick up- unable to reach pt but informed pt dtr.   SDOH Screenings   Food Insecurity: Food Insecurity Present (10/17/2021)  Housing: Low Risk  (12/27/2020)  Transportation Needs: Unmet Transportation Needs (02/24/2024)  Depression (PHQ2-9): Low Risk  (01/14/2023)  Tobacco Use: Medium Risk (09/10/2023)   02/24/2024  Norleen LITTIE Ned DOB: 1956/05/16 MRN: [id number]   RIDER WAIVER AND RELEASE OF LIABILITY  For the purposes of helping with transportation needs, [facility] partners with outside transportation providers (taxi companies, [place], Catering manager.) to give [facility] patients or other approved people the choice of on-demand rides Public librarian) to our buildings for non-emergency visits.  By using [employer], I, the person signing this document, on behalf of myself and/or any legal minors (in my care using the [employer]), agree:  Science writer given to me are supplied by independent, outside transportation providers who do not work for, or have any affiliation with, [employer]. [place] is not a transportation company. [place] has no control over the quality or safety of the rides I get using [employer]. [place] has no control over whether any outside ride will happen on time or not. [facility] gives no guarantee on the reliability, quality, safety, or availability on any rides, or that no mistakes will happen. I know and accept that traveling by vehicle (car, truck, SVU, fleeta, bus, taxi, etc.) has risks of serious injuries such as disability, being paralyzed, and death. I know and agree the risk of using [employer] is mine alone, and not [employer]. [employer] are provided as is and as are available. The transportation providers are in charge for  all inspections and care of the vehicles used to provide these rides. I agree not to take legal action against [facility], its agents, employees, officers, directors, representatives, insurers, attorneys, assigns, successors, subsidiaries, and affiliates at any time for any reasons related directly or indirectly to using [employer]. I also agree not to take legal action against [place] or its affiliates for any injury, death, or damage to property caused by or related to using [employer]. I have read this Waiver and Release of Liability, and I understand the terms used in it and their legal meaning. This Waiver is freely and voluntarily given with the understanding that my right (or any legal minors) to legal action against  relating to [employer] is knowingly given up to use these services.   I attest that I read the Ride Waiver and Release of Liability to Norleen LITTIE Ned, gave Mr. Prusinski the opportunity to ask questions and answered the questions asked (if any). I affirm that Norleen LITTIE Ned then provided consent for assistance with transportation.     Linzy Darling H Jostin Rue

## 2024-02-25 ENCOUNTER — Encounter (HOSPITAL_COMMUNITY): Payer: Self-pay

## 2024-02-25 ENCOUNTER — Ambulatory Visit (HOSPITAL_COMMUNITY)
Admission: RE | Admit: 2024-02-25 | Discharge: 2024-02-25 | Disposition: A | Discharge status: Home / Self Care | Source: Ambulatory Visit | Attending: Physician Assistant | Admitting: Physician Assistant

## 2024-02-25 ENCOUNTER — Ambulatory Visit (HOSPITAL_COMMUNITY): Payer: Self-pay | Admitting: Physician Assistant

## 2024-02-25 VITALS — BP 126/58 | HR 72 | Ht 69.6 in | Wt 120.2 lb

## 2024-02-25 DIAGNOSIS — I5082 Biventricular heart failure: Secondary | ICD-10-CM | POA: Diagnosis not present

## 2024-02-25 DIAGNOSIS — Z95810 Presence of automatic (implantable) cardiac defibrillator: Secondary | ICD-10-CM | POA: Insufficient documentation

## 2024-02-25 DIAGNOSIS — Z87891 Personal history of nicotine dependence: Secondary | ICD-10-CM | POA: Diagnosis not present

## 2024-02-25 DIAGNOSIS — Z955 Presence of coronary angioplasty implant and graft: Secondary | ICD-10-CM | POA: Insufficient documentation

## 2024-02-25 DIAGNOSIS — I428 Other cardiomyopathies: Secondary | ICD-10-CM | POA: Insufficient documentation

## 2024-02-25 DIAGNOSIS — Z91148 Patient's other noncompliance with medication regimen for other reason: Secondary | ICD-10-CM | POA: Diagnosis not present

## 2024-02-25 DIAGNOSIS — D509 Iron deficiency anemia, unspecified: Secondary | ICD-10-CM | POA: Diagnosis not present

## 2024-02-25 DIAGNOSIS — I110 Hypertensive heart disease with heart failure: Secondary | ICD-10-CM | POA: Insufficient documentation

## 2024-02-25 DIAGNOSIS — Z951 Presence of aortocoronary bypass graft: Secondary | ICD-10-CM | POA: Insufficient documentation

## 2024-02-25 DIAGNOSIS — I447 Left bundle-branch block, unspecified: Secondary | ICD-10-CM | POA: Diagnosis not present

## 2024-02-25 DIAGNOSIS — Z550 Illiteracy and low-level literacy: Secondary | ICD-10-CM | POA: Insufficient documentation

## 2024-02-25 DIAGNOSIS — I351 Nonrheumatic aortic (valve) insufficiency: Secondary | ICD-10-CM | POA: Insufficient documentation

## 2024-02-25 DIAGNOSIS — I2510 Atherosclerotic heart disease of native coronary artery without angina pectoris: Secondary | ICD-10-CM | POA: Insufficient documentation

## 2024-02-25 DIAGNOSIS — Z7982 Long term (current) use of aspirin: Secondary | ICD-10-CM | POA: Insufficient documentation

## 2024-02-25 DIAGNOSIS — Z79899 Other long term (current) drug therapy: Secondary | ICD-10-CM | POA: Diagnosis not present

## 2024-02-25 DIAGNOSIS — I10 Essential (primary) hypertension: Secondary | ICD-10-CM

## 2024-02-25 LAB — BASIC METABOLIC PANEL WITH GFR
Anion gap: 15 (ref 5–15)
BUN: 16 mg/dL (ref 8–23)
CO2: 20 mmol/L — ABNORMAL LOW (ref 22–32)
Calcium: 8.8 mg/dL — ABNORMAL LOW (ref 8.9–10.3)
Chloride: 99 mmol/L (ref 98–111)
Creatinine, Ser: 0.89 mg/dL (ref 0.61–1.24)
GFR, Estimated: >60 mL/min (ref >60)
Glucose, Bld: 84 mg/dL (ref 70–99)
Potassium: 4.9 mmol/L (ref 3.5–5.1)
Sodium: 134 mmol/L — ABNORMAL LOW (ref 135–145)

## 2024-02-25 LAB — BRAIN NATRIURETIC PEPTIDE: B Natriuretic Peptide: 172.4 pg/mL — ABNORMAL HIGH (ref 0.0–100.0)

## 2024-02-25 NOTE — Progress Notes (Addendum)
 Advanced Heart Failure Clinic Note  Date:  02/25/2024   PCP:  Dorina Loving, PA-C  HF Cardiologist:  Toribio Fuel, MD   HPI: Jeremy Powers is a 68 y.o.male CAD s/p PCI to LAD in 2015 and 2016, s/p CABG 2017, systolic HF EF 20-25%, microcytic/iron  deficiency anemia followed by hematology and GI, LBBB, and tobacco use. Illiterate and cannot read.   In 2/20 he presented to [facility] ED with SOB, cough, and orthopnea in setting of med non-compliance. Was unable to afford copays with Medicaid. He was tachycardic and tachypneic on arrival, and was transferred to [facility] 06/22/18 for further evaluation. Echo showed newly decreased EF of 25-30% from 50-55% in 12/2017. Underwent R/LHC showing severe 1 v disease with occluded LAD within previous stent. RCA and LCX normal. LIMA to LAD and SVG to D2 patent. Cath also showed low filling pressures with moderately low cardiac output. Meds adjusted as tolerated.   OV 04/05/20 returns for HF follow up after not being seen in > 1 year. Followed by HF Paramedicine. Lasix  was stopped & Farxiga  was started.  Echo 2/22 LVEF 20-25%, RV ok.    3/22 he was discharged from community paramedicine program and bubble pill packs arranged.   Seen in the ED 10/25/20 for CHF exacerbation in the setting of medication non-compliance. He was diuresed with IV lasix  and discharged home. Sent to the ED 12/23/20 by his PCP and found to be A/C CHF and COVID +. Repeat echo EF 10-15%. Diuresed 13 lbs, beta blocker stopped with acute decompensation, digoxin  added, and Entresto  held with low BP.  Admitted 12/22 with A/C CHF after stopping his medications a month prior. Echo showed EF 20-25%. He was diuresed with IV lasix  and GDMT restarted.   Out of HF meds at follow up, volume up. Meds restarted and paramedicine arranged.  Admitted 5/23 with a/c CHF, due to non-compliance with medications. Diuresed with IV lasix . Hospitalization complicated by AKI. GDMT restarted after SCr improved.  Discharged home, weight 138 lbs.  S/p BiV 1/24.  Echo 8/234 showed EF 50-55%, G1DD, normal RV.  Here today for CHF follow-up. He notes a little fatigue after taking medications. Doesn't last long. Able to walk a couple of blocks to the dumpster and back without difficulty. Also walks a few blocks to the store. Tries to keep active. No dyspnea with exertion, orthopnea, PND or lower extremity edema. Occasionally forgets to take his medications. Meds are arranged in boxes by Paramedicine. No ETOH or tobacco use.   Cardiac Studies: - Echo (8/24): EF 50-55%, G1DD, normal RV  - Echo (12/22): EF 20-25%, global HK, grade II DD, RV moderately reduced, moderate MR  - Echo (8/22): EF 10-15%, Repeat echo showed EF 10-15%, severe global hypokinesis with septal akinesis, LV severely dilated, grade II DD, RV fxn moderate to severely reduced, moderate to severe MR, moderate to severe AI.   - Echo (2/22): EF 20-25%, RV ok   - Echo (9/20): EF 20-25%   - Echo (2/20): EF 20-25%   - cMRI (06/27/18): EF 24%. Normal RV   - R/LHC (2/20) RA = 1 RV = 19/3 PA = 24/5 (14) PCW = 7 Fick cardiac output/index = 4.0/2.5 PVR = 1.9 WU Ao sat = 95% PA sat = 68%, 71%   Assessment: 1. Severe 1-vessel CAD with occluded LAD within previous stent 2. Normal RCA and LCX 3. LIMA to LAD widely patent 4. SVG - D2 widely patent 5. SVG - D1 likely  occluded (grafts not marked)  ROS: All systems negative except as listed in HPI, PMH and Problem List.  Past Medical History:  Diagnosis Date   CHF (congestive heart failure) (HCC)    Cirrhosis (HCC)    Coronary artery disease    Hypertension    Pancreatitis    Past Surgical History:  Procedure Laterality Date   BIV ICD INSERTION CRT-D N/A 05/28/2022   Procedure: BIV ICD INSERTION CRT-D;  Surgeon: Waddell Danelle ORN, MD;  Location: [facility] INVASIVE CV LAB;  Service: Cardiovascular;  Laterality: N/A;   CARDIAC SURGERY     RIGHT/LEFT HEART CATH AND CORONARY/GRAFT ANGIOGRAPHY N/A  06/24/2018   Procedure: RIGHT/LEFT HEART CATH AND CORONARY/GRAFT ANGIOGRAPHY;  Surgeon: Cherrie Toribio SAUNDERS, MD;  Location: [facility] INVASIVE CV LAB;  Service: Cardiovascular;  Laterality: N/A;   Current Outpatient Medications  Medication Sig Dispense Refill   aspirin  EC 81 MG tablet TAKE 1 TABLET (81 MG TOTAL) BY MOUTH DAILY. NEEDS FOLLOW UP APPOINTMENT FOR MORE REFILLS 30 tablet 0   atorvastatin  (LIPITOR) 40 MG tablet TAKE 1 TABLET (40 MG TOTAL) BY MOUTH DAILY. NEEDS FOLLOW UP APPOINTMENT FOR ANYMORE REFILLS 90 tablet 0   carvedilol  (COREG ) 3.125 MG tablet TAKE 1 TABLET (3.125 MG TOTAL) BY MOUTH TWO (TWO) TIMES DAILY. NEEDS APPOINTMENT FOR MORE REFILLS 180 tablet 1   FARXIGA  10 MG TABS tablet TAKE 1 TABLET (10 MG TOTAL) BY MOUTH DAILY WITH BREAKFAST. NEEDS FOLLOW UP APPOINTMENT FOR ANYMORE REFILLS 90 tablet 1   FEROSUL 325 (65 Fe) MG tablet TAKE 1 TABLET (325 MG TOTAL) BY MOUTH TWO (TWO) TIMES DAILY 30 tablet 1   furosemide  (LASIX ) 40 MG tablet TAKE 1 TABLET (40 MG TOTAL) BY MOUTH DAILY 90 tablet 3   potassium chloride  SA (KLOR-CON  M) 20 MEQ tablet TAKE ONE TABLET BY MOUTH DAILY 90 tablet 0   sacubitril -valsartan  (ENTRESTO ) 49-51 MG Take 1 tablet by mouth 2 (two) times daily. 180 tablet 3   spironolactone  (ALDACTONE ) 25 MG tablet TAKE ONE TABLET BY MOUTH AT BEDTIME 30 tablet 2   No current facility-administered medications for this visit.   Allergies:   Patient has no known allergies.   Social History:  The patient  reports that he has quit smoking. His smoking use included cigarettes. He has never used smokeless tobacco. He reports current alcohol use. He reports current drug use. Drug: Marijuana.   Family History:  The patient's family history is not on file.   ROS:  Please see the history of present illness.   All other systems are personally reviewed and negative.   Recent Labs: 09/10/2023: B Natriuretic Peptide 1,098.7 09/20/2023: BUN 20; Creatinine, Ser 1.27; Potassium 4.4; Sodium 135   Personally reviewed   There were no vitals taken for this visit.  Wt Readings from Last 3 Encounters:  02/13/24 57.3 kg (126 lb 6.4 oz)  12/26/23 58.3 kg (128 lb 9.6 oz)  12/11/23 56.6 kg (124 lb 12.8 oz)   Physical Exam: General:  Thin, elderly male Cor: No JVD. Regular rate & rhythm. No murmurs. Lungs: clear Abdomen: soft, nontender, nondistended.  Extremities: no edema Neuro: alert & orientedx3. Affect pleasant   Device interrogation: HL index 0, activity level 1.6 hrs day, no AT/AF or VT, 100% LV paced (personally reviewed)   ASSESSMENT AND PLAN: 1.  Chronic biventricular heart failure, NICM - Echo (2/20): EF 25-30%,   - cMRI (2/20) with EF 24% - Echo (9/20): EF 20-25% - Echo (2/22): EF 20-25% RV ok - Echo (8/22):  EF 10-15%, moderately to severely reduced RV function, moderate to severe MR, moderate to severe AI. - Echo (12/22): EF 20-25%, global HK, grade II DD, RV moderately reduced, moderate MR - s/p BiV ICD 1/24 - Echo 8/234 showed EF 50-55%, G1DD, normal RV - NYHA I-II. Volume looks good on exam and by device (HL is 0). Continue lasix  40 mg daily + 20 mEq KCL daily - Continue Entresto  49/51 mg bid   - Continue carvedilol  3.125 mg bid. - Continue Farxiga  10 mg daily. - Continue spiro 25 mg daily. - Reiterated importance of compliance. Occasionally misses meds but doing much better with paramedicine. - He is not a candidate for advanced therapies. - Repeat echo - Check BMET and BNP  2. CAD - s/p CABG 2017 - LHC 06/2018  with occluded LAD normal LCX and RCA. LIMA to LAD and SVG to D2 patent (SVG to D1 occluded) - No angina - Continue aspirin  + statin    3. LBBB - now s/p CRT-D - 100% BiV paced   4. Valvular heart disease - Moderate MR and mild AI on echo 12/22. LVIDd 7.1 cm - Mild on echo 8/24 - Continue to follow   5. h/o tobacco use - Previously 3 ppd - No longer smokes  6. ETOH use - Has quit drinking beer   7. HTN - BP stable - If LV down on  echo will further titrate GDMT   8. Anemia - Iron  deficient. On oral Fe. - Followed by PCP.  9. Illiterate/SDOH - Cannot read. Able to write numbers. Needs assistance with medications.    - Continue w/ paramedicine.      Follow-up 6 months with APP  Avyana Puffenbarger N, PA-C  02/25/2024 8:16 AM

## 2024-02-25 NOTE — Patient Instructions (Signed)
 Medication Changes:  No medication changes today!  Lab Work:  Labs done today, your results will be available in MyChart, we will contact you for abnormal readings.   Testing/Procedures:  Your physician has requested that you have an echocardiogram. Echocardiography is a painless test that uses sound waves to create images of your heart. It provides your doctor with information about the size and shape of your heart and how well your heart's chambers and valves are working. This procedure takes approximately one hour. There are no restrictions for this procedure. Please do NOT wear cologne, perfume, aftershave, or lotions (deodorant is allowed). Please arrive 15 minutes prior to your appointment time.  Please note: We ask at that you not bring children with you during ultrasound (echo/ vascular) testing. Due to room size and safety concerns, children are not allowed in the ultrasound rooms during exams. Our front office staff cannot provide observation of children in our lobby area while testing is being conducted. An adult accompanying a patient to their appointment will only be allowed in the ultrasound room at the discretion of the ultrasound technician under special circumstances. We apologize for any inconvenience.   Follow-Up in: Please follow up with the Advanced Heart Failure Clinic in 6 months with the Advanced Practice Provider.  At the Advanced Heart Failure Clinic, you and your health needs are our priority. We have a designated team specialized in the treatment of Heart Failure. This Care Team includes your primary Heart Failure Specialized Cardiologist (physician), Advanced Practice Providers (APPs- Physician Assistants and Nurse Practitioners), and Pharmacist who all work together to provide you with the care you need, when you need it.   You may see any of the following providers on your designated Care Team at your next follow up:  Dr. Toribio Fuel Dr. Ezra Shuck Dr.  Ria Commander Dr. Odis Brownie Greig Mosses, NP Caffie Shed, [place] [facility], [place] Beckey Coe, NP [place] Lee, NP Tinnie Redman, PharmD   Please be sure to bring in all your medications bottles to every appointment.   Need to Contact Us :  If you have any questions or concerns before your next appointment please send us  a message through [facility] or call our office at [phone].    TO LEAVE A MESSAGE FOR THE NURSE SELECT OPTION 2, PLEASE LEAVE A MESSAGE INCLUDING: YOUR NAME DATE OF BIRTH CALL BACK NUMBER REASON FOR CALL**this is important as we prioritize the call backs  YOU WILL RECEIVE A CALL BACK THE SAME DAY AS LONG AS YOU CALL BEFORE 4:00 PM

## 2024-03-11 ENCOUNTER — Other Ambulatory Visit (HOSPITAL_COMMUNITY): Payer: Self-pay | Admitting: Family Medicine

## 2024-03-11 ENCOUNTER — Other Ambulatory Visit (HOSPITAL_COMMUNITY): Payer: Self-pay

## 2024-03-11 ENCOUNTER — Ambulatory Visit (HOSPITAL_COMMUNITY): Admission: RE | Admit: 2024-03-11 | Source: Ambulatory Visit

## 2024-03-11 NOTE — Progress Notes (Unsigned)
 Advanced Heart Failure Triage Encounter  Patient Name: Jeremy Powers  Date of Call: 03/11/24  Problem:  Patient want to know If we can send in Potassium 10 meq. Patient states the 20 meq tablet are big.   Plan:  Sent to provider for further review   Rolin LOISE Height, CMA

## 2024-03-12 MED ORDER — POTASSIUM CHLORIDE CRYS ER 10 MEQ PO TBCR: 20.0000 meq | EXTENDED_RELEASE_TABLET | Freq: Every day | ORAL | 3 refills | Status: AC

## 2024-03-13 ENCOUNTER — Other Ambulatory Visit (HOSPITAL_COMMUNITY): Payer: Self-pay

## 2024-03-13 DIAGNOSIS — I5022 Chronic systolic (congestive) heart failure: Secondary | ICD-10-CM

## 2024-03-13 MED ORDER — ATORVASTATIN CALCIUM 40 MG PO TABS: 40.0000 mg | ORAL_TABLET | Freq: Every day | ORAL | 1 refills | Status: AC

## 2024-03-13 MED ORDER — DAPAGLIFLOZIN PROPANEDIOL 10 MG PO TABS: 10.0000 mg | ORAL_TABLET | Freq: Every day | ORAL | 1 refills | Status: AC

## 2024-03-17 ENCOUNTER — Other Ambulatory Visit (HOSPITAL_COMMUNITY): Payer: Self-pay | Admitting: Emergency Medicine

## 2024-03-17 NOTE — Progress Notes (Signed)
 Med rec only.  Added 2 10mEq Potassium to his regimen. No complaints. Next home visit 03/24/24 @ 9:00    Mary Sharps, EMT-Paramedic [phone] 03/17/2024

## 2024-03-18 ENCOUNTER — Ambulatory Visit (INDEPENDENT_AMBULATORY_CARE_PROVIDER_SITE_OTHER): Payer: 59

## 2024-03-18 DIAGNOSIS — I428 Other cardiomyopathies: Secondary | ICD-10-CM | POA: Diagnosis not present

## 2024-03-19 LAB — CUP PACEART REMOTE DEVICE CHECK
Battery Remaining Longevity: 126 mo
Battery Remaining Percentage: 100 %
Brady Statistic RA Percent Paced: 1 %
Brady Statistic RV Percent Paced: 41 %
Date Time Interrogation Session: 20251029042800
HighPow Impedance: 64 Ohm
Implantable Lead Connection Status: 753985
Implantable Lead Connection Status: 753985
Implantable Lead Connection Status: 753985
Implantable Lead Implant Date: 20240108
Implantable Lead Implant Date: 20240108
Implantable Lead Implant Date: 20240108
Implantable Lead Location: 753858
Implantable Lead Location: 753859
Implantable Lead Location: 753860
Implantable Lead Model: 137
Implantable Lead Model: 4671
Implantable Lead Model: 7841
Implantable Lead Serial Number: 1396493
Implantable Lead Serial Number: 301415
Implantable Lead Serial Number: 881901
Implantable Pulse Generator Implant Date: 20240108
Lead Channel Impedance Value: 418 Ohm
Lead Channel Impedance Value: 505 Ohm
Lead Channel Impedance Value: 560 Ohm
Lead Channel Pacing Threshold Amplitude: 0.4 V
Lead Channel Pacing Threshold Amplitude: 0.7 V
Lead Channel Pacing Threshold Amplitude: 1 V
Lead Channel Pacing Threshold Pulse Width: 0.4 ms
Lead Channel Pacing Threshold Pulse Width: 0.4 ms
Lead Channel Pacing Threshold Pulse Width: 0.4 ms
Lead Channel Setting Pacing Amplitude: 2.5 V
Lead Channel Setting Pacing Amplitude: 2.5 V
Lead Channel Setting Pacing Amplitude: 2.5 V
Lead Channel Setting Pacing Pulse Width: 0.4 ms
Lead Channel Setting Pacing Pulse Width: 0.4 ms
Lead Channel Setting Sensing Sensitivity: 0.5 mV
Lead Channel Setting Sensing Sensitivity: 1 mV
Pulse Gen Serial Number: 394480

## 2024-03-24 ENCOUNTER — Other Ambulatory Visit (HOSPITAL_COMMUNITY): Payer: Self-pay | Admitting: Emergency Medicine

## 2024-03-24 NOTE — Progress Notes (Signed)
 Paramedicine Encounter    Patient ID: Jeremy Powers, male    DOB: Sep 05, 1955, 68 y.o.   MRN: [id number]   Complaints NONE  Assessment A&O x 4, skin W&D w/ good color.  Denies chest pain or SOB.  Lung sounds clear throughout.  No peripheral edema noted.  Compliance with meds Missed multiple evening doses and reviewed same w/ pt.  Pill box filled YES  Refills needed NONE  Meds changes since last visit NONE    Social changes NONE   BP (!) 158/72 (BP Location: Left Arm, Patient Position: Sitting, Cuff Size: Normal)   Pulse 95   Resp 16   SpO2 94%  Weight yesterday- Last visit weight-126lb   On arrival to patient's apartment, pt was found sitting outside, he was drinking a bottle of [employer] and strong odor of [place] in his little area.  He is always very forthcoming when he is drinking and lets me know when he is smoking. Pt reports that he is feeling well.  He has made friends with a stray dog outside and they sit outside together a lot.  He tells me this brings him a lot of happiness and purpose. He denies chest pain or SOB.  Lung sounds clear throughout.  No peripheral edema noted. Next home visit 11/11 @ 9:00.   ACTION: Home visit completed  [name] [phone] 03/24/24  Patient Care Team: Dorina Dallas RIGGERS as PCP - General (Internal Medicine) Cherrie Toribio SAUNDERS, MD as PCP - Cardiology (Cardiology)  Patient Active Problem List   Diagnosis Date Noted   Lipoma 12/18/2022   Biventricular ICD (implantable cardioverter-defibrillator) in place 09/04/2022   NICM (nonischemic cardiomyopathy) (HCC) 03/20/2022   Malnutrition of moderate degree 09/23/2021   Dyslipidemia 09/21/2021   Anemia 09/21/2021   AKI (acute kidney injury) 09/21/2021   Essential hypertension 09/20/2021   CHF (congestive heart failure) (HCC) 05/08/2021   Acute on chronic systolic CHF (congestive heart failure) (HCC) 05/06/2021   Mitral regurgitation 12/26/2020    COVID-19 virus infection 12/24/2020   CHF exacerbation (HCC) 12/23/2020   Acute on chronic systolic HF (heart failure) (HCC) 06/24/2018   Aortic insufficiency 06/22/2018   Acute on chronic congestive heart failure (HCC) 06/22/2018   Elevated troponin 06/22/2018   CAD (coronary artery disease) 06/22/2018   Left bundle branch block 06/22/2018   History of iron  deficiency anemia 06/22/2018   Acute on chronic heart failure (HCC) 06/22/2018   Chronic bilateral low back pain without sciatica 12/12/2015   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   Iron  deficiency anemia 03/28/2015   Hyperlipidemia 10/12/2014    Current Outpatient Medications:    aspirin  EC 81 MG tablet, TAKE 1 TABLET (81 MG TOTAL) BY MOUTH DAILY. NEEDS FOLLOW UP APPOINTMENT FOR MORE REFILLS, Disp: 30 tablet, Rfl: 0   atorvastatin  (LIPITOR) 40 MG tablet, Take 1 tablet (40 mg total) by mouth daily., Disp: 90 tablet, Rfl: 1   carvedilol  (COREG ) 3.125 MG tablet, TAKE 1 TABLET (3.125 MG TOTAL) BY MOUTH TWO (TWO) TIMES DAILY. NEEDS APPOINTMENT FOR MORE REFILLS, Disp: 180 tablet, Rfl: 1   dapagliflozin  propanediol (FARXIGA ) 10 MG TABS tablet, Take 1 tablet (10 mg total) by mouth daily., Disp: 90 tablet, Rfl: 1   FEROSUL 325 (65 Fe) MG tablet, TAKE 1 TABLET (325 MG TOTAL) BY MOUTH TWO (TWO) TIMES DAILY, Disp: 30 tablet, Rfl: 1   furosemide  (LASIX ) 40 MG tablet, TAKE 1 TABLET (40 MG TOTAL) BY MOUTH DAILY, Disp: 90 tablet, Rfl: 3  potassium chloride  (KLOR-CON  M) 10 MEQ tablet, Take 2 tablets (20 mEq total) by mouth daily., Disp: 90 tablet, Rfl: 3   sacubitril -valsartan  (ENTRESTO ) 49-51 MG, Take 1 tablet by mouth 2 (two) times daily., Disp: 180 tablet, Rfl: 3   spironolactone  (ALDACTONE ) 25 MG tablet, TAKE ONE TABLET BY MOUTH AT BEDTIME, Disp: 30 tablet, Rfl: 2 No Known Allergies   Social History   Socioeconomic History   Marital status: Single    Spouse name: Not on file   Number of children: Not on file   Years of  education: Not on file   Highest education level: Not on file  Occupational History   Not on file  Tobacco Use   Smoking status: Former    Current packs/day: 0.50    Types: Cigarettes   Smokeless tobacco: Never  Vaping Use   Vaping status: Never Used  Substance and Sexual Activity   Alcohol use: Yes    Comment: daily   Drug use: Yes    Types: Marijuana   Sexual activity: Not on file  Other Topics Concern   Not on file  Social History Narrative   Not on file   Social Drivers of Health   Financial Resource Strain: Not on file  Food Insecurity: Food Insecurity Present (10/17/2021)   Hunger Vital Sign    Worried About Running Out of Food in the Last Year: Often true    Ran Out of Food in the Last Year: Sometimes true  Transportation Needs: Unmet Transportation Needs (02/24/2024)   PRAPARE - Administrator, Civil Service (Medical): Yes    Lack of Transportation (Non-Medical): Yes  Physical Activity: Not on file  Stress: Not on file  Social Connections: Not on file  Intimate Partner Violence: Not on file    Physical Exam      Future Appointments  Date Time Provider Department Center  08/25/2024  9:00 AM [facility] PA/NP [facility] None

## 2024-03-25 NOTE — Progress Notes (Signed)
 Remote ICD Transmission

## 2024-03-26 ENCOUNTER — Ambulatory Visit: Payer: Self-pay | Admitting: Internal Medicine

## 2024-03-31 ENCOUNTER — Other Ambulatory Visit (HOSPITAL_COMMUNITY): Payer: Self-pay | Admitting: Emergency Medicine

## 2024-03-31 NOTE — Progress Notes (Signed)
 Paramedicine Encounter    Patient ID: Jeremy Powers, male    DOB: [birth year], 68 y.o.   MRN: [id number]   Complaints NONE  Assessment A&O x 4, skin W&D w/ good color.  Denies chest pain or SOB.  Lung sounds clear and equal bilat. No peripheral edema noted.    Compliance with meds No  Pill box filled x 1 week  Refills needed NONE  Meds changes since last visit NONE    Social changes NONE   BP 120/60 (BP Location: Left Arm, Patient Position: Sitting, Cuff Size: Normal)   Pulse 77   Wt 119 lb 12.8 oz (54.3 kg)   SpO2 96%   BMI 17.39 kg/m  Weight yesterday- not taken Last visit weight-120lb  Only one week of meds reconciled at today's visit.  Pt. Seems to struggle w/ more than one week at a time and gets off track w/ his meds.   Pt denies chest pain or SOB.  Lung sounds clear throughout.  No peripheral edema noted.    ACTION: Home visit completed  [name] [phone] 03/31/24  Patient Care Team: Dorina Dallas RIGGERS as PCP - General (Internal Medicine) Cherrie Toribio SAUNDERS, MD as PCP - Cardiology (Cardiology)  Patient Active Problem List   Diagnosis Date Noted   Lipoma 12/18/2022   Biventricular ICD (implantable cardioverter-defibrillator) in place 09/04/2022   NICM (nonischemic cardiomyopathy) (HCC) 03/20/2022   Malnutrition of moderate degree 09/23/2021   Dyslipidemia 09/21/2021   Anemia 09/21/2021   AKI (acute kidney injury) 09/21/2021   Essential hypertension 09/20/2021   CHF (congestive heart failure) (HCC) 05/08/2021   Acute on chronic systolic CHF (congestive heart failure) (HCC) 05/06/2021   Mitral regurgitation 12/26/2020   COVID-19 virus infection 12/24/2020   CHF exacerbation (HCC) 12/23/2020   Acute on chronic systolic HF (heart failure) (HCC) 06/24/2018   Aortic insufficiency 06/22/2018   Acute on chronic congestive heart failure (HCC) 06/22/2018   Elevated troponin 06/22/2018   CAD (coronary artery disease) 06/22/2018   Left  bundle branch block 06/22/2018   History of iron  deficiency anemia 06/22/2018   Acute on chronic heart failure (HCC) 06/22/2018   Chronic bilateral low back pain without sciatica 12/12/2015   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   Iron  deficiency anemia 03/28/2015   Hyperlipidemia 10/12/2014    Current Outpatient Medications:    aspirin  EC 81 MG tablet, TAKE 1 TABLET (81 MG TOTAL) BY MOUTH DAILY. NEEDS FOLLOW UP APPOINTMENT FOR MORE REFILLS, Disp: 30 tablet, Rfl: 0   atorvastatin  (LIPITOR) 40 MG tablet, Take 1 tablet (40 mg total) by mouth daily., Disp: 90 tablet, Rfl: 1   carvedilol  (COREG ) 3.125 MG tablet, TAKE 1 TABLET (3.125 MG TOTAL) BY MOUTH TWO (TWO) TIMES DAILY. NEEDS APPOINTMENT FOR MORE REFILLS, Disp: 180 tablet, Rfl: 1   dapagliflozin  propanediol (FARXIGA ) 10 MG TABS tablet, Take 1 tablet (10 mg total) by mouth daily., Disp: 90 tablet, Rfl: 1   FEROSUL 325 (65 Fe) MG tablet, TAKE 1 TABLET (325 MG TOTAL) BY MOUTH TWO (TWO) TIMES DAILY, Disp: 30 tablet, Rfl: 1   furosemide  (LASIX ) 40 MG tablet, TAKE 1 TABLET (40 MG TOTAL) BY MOUTH DAILY, Disp: 90 tablet, Rfl: 3   potassium chloride  (KLOR-CON  M) 10 MEQ tablet, Take 2 tablets (20 mEq total) by mouth daily., Disp: 90 tablet, Rfl: 3   sacubitril -valsartan  (ENTRESTO ) 49-51 MG, Take 1 tablet by mouth 2 (two) times daily., Disp: 180 tablet, Rfl: 3   spironolactone  (ALDACTONE ) 25 MG tablet,  TAKE ONE TABLET BY MOUTH AT BEDTIME, Disp: 30 tablet, Rfl: 2 No Known Allergies   Social History   Socioeconomic History   Marital status: Single    Spouse name: Not on file   Number of children: Not on file   Years of education: Not on file   Highest education level: Not on file  Occupational History   Not on file  Tobacco Use   Smoking status: Former    Current packs/day: 0.50    Types: Cigarettes   Smokeless tobacco: Never  Vaping Use   Vaping status: Never Used  Substance and Sexual Activity   Alcohol use: Yes    Comment:  daily   Drug use: Yes    Types: Marijuana   Sexual activity: Not on file  Other Topics Concern   Not on file  Social History Narrative   Not on file   Social Drivers of Health   Financial Resource Strain: Not on file  Food Insecurity: Food Insecurity Present (10/17/2021)   Hunger Vital Sign    Worried About Running Out of Food in the Last Year: Often true    Ran Out of Food in the Last Year: Sometimes true  Transportation Needs: Unmet Transportation Needs (02/24/2024)   PRAPARE - Administrator, Civil Service (Medical): Yes    Lack of Transportation (Non-Medical): Yes  Physical Activity: Not on file  Stress: Not on file  Social Connections: Not on file  Intimate Partner Violence: Not on file    Physical Exam      Future Appointments  Date Time Provider Department Center  08/25/2024  9:00 AM [facility] PA/NP [facility] None

## 2024-04-09 ENCOUNTER — Other Ambulatory Visit (HOSPITAL_COMMUNITY): Payer: Self-pay | Admitting: Emergency Medicine

## 2024-04-09 NOTE — Progress Notes (Signed)
 Paramedicine Encounter    Patient ID: Jeremy Powers, male    DOB: Feb 10, 1956, 68 y.o.   MRN: [id number]   Complaints NONE  Assessment A&O x 4, skin W&D w/ good color. Denies chest pain or SOB.  Lung sounds clear and equal bilat.  No edema noted  Compliance with meds No  Pill box filled x 2 weeks  Refills needed NONE  Meds changes since last visit NONE    Social changes NONE   BP 130/60 (BP Location: Left Arm, Patient Position: Standing, Cuff Size: Normal)   Pulse 80   Resp 16   SpO2 97%  Weight yesterday-  Last visit weight-  Provided Jeremy Powers w/ 2 food boxes for Thanksgiving from [employer].  ACTION: Home visit completed  Jeremy Powers [phone] 04/09/24  Patient Care Team: Dorina Dallas RIGGERS as PCP - General (Internal Medicine) Cherrie Toribio SAUNDERS, MD as PCP - Cardiology (Cardiology)  Patient Active Problem List   Diagnosis Date Noted   Lipoma 12/18/2022   Biventricular ICD (implantable cardioverter-defibrillator) in place 09/04/2022   NICM (nonischemic cardiomyopathy) (HCC) 03/20/2022   Malnutrition of moderate degree 09/23/2021   Dyslipidemia 09/21/2021   Anemia 09/21/2021   AKI (acute kidney injury) 09/21/2021   Essential hypertension 09/20/2021   CHF (congestive heart failure) (HCC) 05/08/2021   Acute on chronic systolic CHF (congestive heart failure) (HCC) 05/06/2021   Mitral regurgitation 12/26/2020   COVID-19 virus infection 12/24/2020   CHF exacerbation (HCC) 12/23/2020   Acute on chronic systolic HF (heart failure) (HCC) 06/24/2018   Aortic insufficiency 06/22/2018   Acute on chronic congestive heart failure (HCC) 06/22/2018   Elevated troponin 06/22/2018   CAD (coronary artery disease) 06/22/2018   Left bundle branch block 06/22/2018   History of iron  deficiency anemia 06/22/2018   Acute on chronic heart failure (HCC) 06/22/2018   Chronic bilateral low back pain without sciatica 12/12/2015   Presence  of aortocoronary bypass graft 12/12/2015   Iron  deficiency anemia 03/28/2015   Hyperlipidemia 10/12/2014    Current Outpatient Medications:    aspirin  EC 81 MG tablet, TAKE 1 TABLET (81 MG TOTAL) BY MOUTH DAILY. NEEDS FOLLOW UP APPOINTMENT FOR MORE REFILLS, Disp: 30 tablet, Rfl: 0   atorvastatin  (LIPITOR) 40 MG tablet, Take 1 tablet (40 mg total) by mouth daily., Disp: 90 tablet, Rfl: 1   carvedilol  (COREG ) 3.125 MG tablet, TAKE 1 TABLET (3.125 MG TOTAL) BY MOUTH TWO (TWO) TIMES DAILY. NEEDS APPOINTMENT FOR MORE REFILLS, Disp: 180 tablet, Rfl: 1   dapagliflozin  propanediol (FARXIGA ) 10 MG TABS tablet, Take 1 tablet (10 mg total) by mouth daily., Disp: 90 tablet, Rfl: 1   FEROSUL 325 (65 Fe) MG tablet, TAKE 1 TABLET (325 MG TOTAL) BY MOUTH TWO (TWO) TIMES DAILY, Disp: 30 tablet, Rfl: 1   furosemide  (LASIX ) 40 MG tablet, TAKE 1 TABLET (40 MG TOTAL) BY MOUTH DAILY, Disp: 90 tablet, Rfl: 3   potassium chloride  (KLOR-CON  M) 10 MEQ tablet, Take 2 tablets (20 mEq total) by mouth daily., Disp: 90 tablet, Rfl: 3   sacubitril -valsartan  (ENTRESTO ) 49-51 MG, Take 1 tablet by mouth 2 (two) times daily., Disp: 180 tablet, Rfl: 3   spironolactone  (ALDACTONE ) 25 MG tablet, TAKE ONE TABLET BY MOUTH AT BEDTIME, Disp: 30 tablet, Rfl: 2 No Known Allergies   Social History   Socioeconomic History   Marital status: Single    Spouse name: Not on file   Number of children: Not on file   Years  of education: Not on file   Highest education level: Not on file  Occupational History   Not on file  Tobacco Use   Smoking status: Former    Current packs/day: 0.50    Types: Cigarettes   Smokeless tobacco: Never  Vaping Use   Vaping status: Never Used  Substance and Sexual Activity   Alcohol use: Yes    Comment: daily   Drug use: Yes    Types: Marijuana   Sexual activity: Not on file  Other Topics Concern   Not on file  Social History Narrative   Not on file   Social Drivers of Health   Financial  Resource Strain: Not on file  Food Insecurity: Food Insecurity Present (10/17/2021)   Hunger Vital Sign    Worried About Running Out of Food in the Last Year: Often true    Ran Out of Food in the Last Year: Sometimes true  Transportation Needs: Unmet Transportation Needs (02/24/2024)   PRAPARE - Administrator, Civil Service (Medical): Yes    Lack of Transportation (Non-Medical): Yes  Physical Activity: Not on file  Stress: Not on file  Social Connections: Not on file  Intimate Partner Violence: Not on file    Physical Exam      Future Appointments  Date Time Provider Department Center  08/25/2024  9:00 AM [facility] PA/NP [facility] None

## 2024-04-23 ENCOUNTER — Other Ambulatory Visit (HOSPITAL_COMMUNITY): Payer: Self-pay | Admitting: Emergency Medicine

## 2024-04-23 NOTE — Progress Notes (Signed)
 Paramedicine Encounter    Patient ID: Jeremy Powers, male    DOB: 11/05/1955, 68 y.o.   MRN: [id number]   Complaints NONE  Assessment A&O x 4, skin W&D w/ good color. Denies chest pain or SOB. Lung sounds clear and equal bilat and no peripheral edema noted.  Compliance with meds Takes more meds than he misses.   Pill box filled x 1 week  Refills needed NONE  Meds changes since last visit NONE    Social changes NONE   BP 138/60 (BP Location: Right Arm, Patient Position: Sitting, Cuff Size: Normal)   Pulse 70   Resp 16   Wt 128 lb 3.2 oz (58.2 kg)   SpO2 96%   BMI 18.61 kg/m  Weight yesterday- Last visit weight-  ACTION: Home visit completed  [name] [phone] 04/23/24   Patient Care Team: Dorina Dallas RIGGERS as PCP - General (Internal Medicine) Cherrie Toribio SAUNDERS, MD as PCP - Cardiology (Cardiology)  Patient Active Problem List   Diagnosis Date Noted   Lipoma 12/18/2022   Biventricular ICD (implantable cardioverter-defibrillator) in place 09/04/2022   NICM (nonischemic cardiomyopathy) (HCC) 03/20/2022   Malnutrition of moderate degree 09/23/2021   Dyslipidemia 09/21/2021   Anemia 09/21/2021   AKI (acute kidney injury) 09/21/2021   Essential hypertension 09/20/2021   CHF (congestive heart failure) (HCC) 05/08/2021   Acute on chronic systolic CHF (congestive heart failure) (HCC) 05/06/2021   Mitral regurgitation 12/26/2020   COVID-19 virus infection 12/24/2020   CHF exacerbation (HCC) 12/23/2020   Acute on chronic systolic HF (heart failure) (HCC) 06/24/2018   Aortic insufficiency 06/22/2018   Acute on chronic congestive heart failure (HCC) 06/22/2018   Elevated troponin 06/22/2018   CAD (coronary artery disease) 06/22/2018   Left bundle branch block 06/22/2018   History of iron  deficiency anemia 06/22/2018   Acute on chronic heart failure (HCC) 06/22/2018   Chronic bilateral low back pain without sciatica 12/12/2015   Presence of  aortocoronary bypass graft 12/12/2015   Iron  deficiency anemia 03/28/2015   Hyperlipidemia 10/12/2014   Current Medications[1] Allergies[2]   Social History   Socioeconomic History   Marital status: Single    Spouse name: Not on file   Number of children: Not on file   Years of education: Not on file   Highest education level: Not on file  Occupational History   Not on file  Tobacco Use   Smoking status: Former    Current packs/day: 0.50    Types: Cigarettes   Smokeless tobacco: Never  Vaping Use   Vaping status: Never Used  Substance and Sexual Activity   Alcohol use: Yes    Comment: daily   Drug use: Yes    Types: Marijuana   Sexual activity: Not on file  Other Topics Concern   Not on file  Social History Narrative   Not on file   Social Drivers of Health   Tobacco Use: Medium Risk (02/25/2024)   Patient History    Smoking Tobacco Use: Former    Smokeless Tobacco Use: Never    Passive Exposure: Not on Actuary Strain: Not on file  Food Insecurity: Food Insecurity Present (10/17/2021)   Hunger Vital Sign    Worried About Running Out of Food in the Last Year: Often true    Ran Out of Food in the Last Year: Sometimes true  Transportation Needs: Unmet Transportation Needs (02/24/2024)   Epic    Lack of Transportation (Medical): Yes  Lack of Transportation (Non-Medical): Yes  Physical Activity: Not on file  Stress: Not on file  Social Connections: Not on file  Intimate Partner Violence: Not on file  Depression [phone]): Low Risk (01/14/2023)   Depression (PHQ2-9)    PHQ-2 Score: 0  Alcohol Screen: Not on file  Housing: Not on file  Utilities: Not on file  Health Literacy: Not on file    Physical Exam      Future Appointments  Date Time Provider Department Center  08/25/2024  9:00 AM [facility] PA/NP [facility] None          [1]  Current Outpatient Medications:    aspirin  EC 81 MG tablet, TAKE 1 TABLET (81 MG TOTAL) BY MOUTH DAILY.  NEEDS FOLLOW UP APPOINTMENT FOR MORE REFILLS, Disp: 30 tablet, Rfl: 0   atorvastatin  (LIPITOR) 40 MG tablet, Take 1 tablet (40 mg total) by mouth daily., Disp: 90 tablet, Rfl: 1   dapagliflozin  propanediol (FARXIGA ) 10 MG TABS tablet, Take 1 tablet (10 mg total) by mouth daily., Disp: 90 tablet, Rfl: 1   FEROSUL 325 (65 Fe) MG tablet, TAKE 1 TABLET (325 MG TOTAL) BY MOUTH TWO (TWO) TIMES DAILY, Disp: 30 tablet, Rfl: 1   furosemide  (LASIX ) 40 MG tablet, TAKE 1 TABLET (40 MG TOTAL) BY MOUTH DAILY, Disp: 90 tablet, Rfl: 3   potassium chloride  (KLOR-CON  M) 10 MEQ tablet, Take 2 tablets (20 mEq total) by mouth daily., Disp: 90 tablet, Rfl: 3   sacubitril -valsartan  (ENTRESTO ) 49-51 MG, Take 1 tablet by mouth 2 (two) times daily., Disp: 180 tablet, Rfl: 3   spironolactone  (ALDACTONE ) 25 MG tablet, TAKE ONE TABLET BY MOUTH AT BEDTIME, Disp: 30 tablet, Rfl: 2   carvedilol  (COREG ) 3.125 MG tablet, Take 1 tablet (3.125 mg total) by mouth 2 (two) times daily with a meal., Disp: 180 tablet, Rfl: 3 [2] No Known Allergies

## 2024-04-29 ENCOUNTER — Other Ambulatory Visit (HOSPITAL_COMMUNITY): Payer: Self-pay | Admitting: Emergency Medicine

## 2024-04-29 ENCOUNTER — Other Ambulatory Visit (HOSPITAL_COMMUNITY): Payer: Self-pay | Admitting: Internal Medicine

## 2024-04-29 ENCOUNTER — Other Ambulatory Visit (HOSPITAL_COMMUNITY): Payer: Self-pay

## 2024-04-29 MED ORDER — CARVEDILOL 3.125 MG PO TABS: 3.1250 mg | ORAL_TABLET | Freq: Two times a day (BID) | ORAL | 3 refills | Status: AC

## 2024-04-29 NOTE — Progress Notes (Unsigned)
 Paramedicine Encounter    Patient ID: Jeremy Powers, male    DOB: [birth year], 68 y.o.   MRN: [id number]   Complaints NONE  Assessment A&O x 4, skin W&D w/ good color.  Denies chest pain or SOB.  Lung sounds clear bilat.  No peripheral edema noted.  Compliance with meds YES  Pill box filled x 1 week  Refills needed Carvedilol , Ferrous Sulfate .  Meds changes since last visit NONE    Social changes NONE   BP 118/60 (BP Location: Right Arm, Patient Position: Standing, Cuff Size: Normal)   Pulse 60   Resp 14   Wt 127 lb 12.8 oz (58 kg)   SpO2 96%   BMI 18.55 kg/m  Weight yesterday- Last visit weight-128.2lb  ACTION: Home visit completed  [name] [phone] 05/01/2024  Patient Care Team: Dorina Dallas RIGGERS as PCP - General (Internal Medicine) Cherrie Toribio SAUNDERS, MD as PCP - Cardiology (Cardiology)  Patient Active Problem List   Diagnosis Date Noted   Lipoma 12/18/2022   Biventricular ICD (implantable cardioverter-defibrillator) in place 09/04/2022   NICM (nonischemic cardiomyopathy) (HCC) 03/20/2022   Malnutrition of moderate degree 09/23/2021   Dyslipidemia 09/21/2021   Anemia 09/21/2021   AKI (acute kidney injury) 09/21/2021   Essential hypertension 09/20/2021   CHF (congestive heart failure) (HCC) 05/08/2021   Acute on chronic systolic CHF (congestive heart failure) (HCC) 05/06/2021   Mitral regurgitation 12/26/2020   COVID-19 virus infection 12/24/2020   CHF exacerbation (HCC) 12/23/2020   Acute on chronic systolic HF (heart failure) (HCC) 06/24/2018   Aortic insufficiency 06/22/2018   Acute on chronic congestive heart failure (HCC) 06/22/2018   Elevated troponin 06/22/2018   CAD (coronary artery disease) 06/22/2018   Left bundle branch block 06/22/2018   History of iron  deficiency anemia 06/22/2018   Acute on chronic heart failure (HCC) 06/22/2018   Chronic bilateral low back pain without sciatica 12/12/2015   Presence of  aortocoronary bypass graft 12/12/2015   Iron  deficiency anemia 03/28/2015   Hyperlipidemia 10/12/2014   Current Medications[1] Allergies[2]   Social History   Socioeconomic History   Marital status: Single    Spouse name: Not on file   Number of children: Not on file   Years of education: Not on file   Highest education level: Not on file  Occupational History   Not on file  Tobacco Use   Smoking status: Former    Current packs/day: 0.50    Types: Cigarettes   Smokeless tobacco: Never  Vaping Use   Vaping status: Never Used  Substance and Sexual Activity   Alcohol use: Yes    Comment: daily   Drug use: Yes    Types: Marijuana   Sexual activity: Not on file  Other Topics Concern   Not on file  Social History Narrative   Not on file   Social Drivers of Health   Tobacco Use: Medium Risk (02/25/2024)   Patient History    Smoking Tobacco Use: Former    Smokeless Tobacco Use: Never    Passive Exposure: Not on Actuary Strain: Not on file  Food Insecurity: Food Insecurity Present (10/17/2021)   Hunger Vital Sign    Worried About Radiation Protection Practitioner of Food in the Last Year: Often true    Ran Out of Food in the Last Year: Sometimes true  Transportation Needs: Unmet Transportation Needs (02/24/2024)   Epic    Lack of Transportation (Medical): Yes    Lack of Transportation (Non-Medical): Yes  Physical Activity: Not on file  Stress: Not on file  Social Connections: Not on file  Intimate Partner Violence: Not on file  Depression (PHQ2-9): Low Risk (01/14/2023)   Depression (PHQ2-9)    PHQ-2 Score: 0  Alcohol Screen: Not on file  Housing: Not on file  Utilities: Not on file  Health Literacy: Not on file    Physical Exam      Future Appointments  Date Time Provider Department Center  08/25/2024  9:00 AM [facility] PA/NP [facility] None          [1]  Current Outpatient Medications:    aspirin  EC 81 MG tablet, TAKE 1 TABLET (81 MG TOTAL) BY MOUTH DAILY.  NEEDS FOLLOW UP APPOINTMENT FOR MORE REFILLS, Disp: 30 tablet, Rfl: 0   atorvastatin  (LIPITOR) 40 MG tablet, Take 1 tablet (40 mg total) by mouth daily., Disp: 90 tablet, Rfl: 1   dapagliflozin  propanediol (FARXIGA ) 10 MG TABS tablet, Take 1 tablet (10 mg total) by mouth daily., Disp: 90 tablet, Rfl: 1   FEROSUL 325 (65 Fe) MG tablet, TAKE 1 TABLET (325 MG TOTAL) BY MOUTH TWO (TWO) TIMES DAILY, Disp: 30 tablet, Rfl: 1   furosemide  (LASIX ) 40 MG tablet, TAKE 1 TABLET (40 MG TOTAL) BY MOUTH DAILY, Disp: 90 tablet, Rfl: 3   potassium chloride  (KLOR-CON  M) 10 MEQ tablet, Take 2 tablets (20 mEq total) by mouth daily., Disp: 90 tablet, Rfl: 3   sacubitril -valsartan  (ENTRESTO ) 49-51 MG, Take 1 tablet by mouth 2 (two) times daily., Disp: 180 tablet, Rfl: 3   spironolactone  (ALDACTONE ) 25 MG tablet, TAKE ONE TABLET BY MOUTH AT BEDTIME, Disp: 30 tablet, Rfl: 2   carvedilol  (COREG ) 3.125 MG tablet, Take 1 tablet (3.125 mg total) by mouth 2 (two) times daily with a meal., Disp: 180 tablet, Rfl: 3 [2] No Known Allergies

## 2024-05-08 ENCOUNTER — Other Ambulatory Visit (HOSPITAL_COMMUNITY): Payer: Self-pay | Admitting: Emergency Medicine

## 2024-05-08 NOTE — Progress Notes (Signed)
 Paramedicine Encounter    Patient ID: Jeremy Powers, male    DOB: [birth year], 68 y.o.   MRN: [id number]   Complaints NONE  Assessment A&O x 4, skin W&D w/ good color.  Denies chest pain or SOB.  Lung sounds clear and equal bilat and no peripheral edema  Compliance with meds Missed 1 daily dose  Pill box filled YES x 2 weeks  Refills needed NONE  Meds changes since last visit NONE    Social changes Is looking for a new apartment as the family apt lease is up for renewal in February.    BP 120/70 (BP Location: Left Arm, Patient Position: Standing, Cuff Size: Normal)   Pulse 74   Resp 16   SpO2 96%   Delivered [name] giving tree gifts for Christmas.  ACTION: Home visit completed  [name] [phone] 05/15/2024  Patient Care Team: Dorina Dallas RIGGERS as PCP - General (Internal Medicine) Cherrie Toribio SAUNDERS, MD as PCP - Cardiology (Cardiology)  Patient Active Problem List   Diagnosis Date Noted   Lipoma 12/18/2022   Biventricular ICD (implantable cardioverter-defibrillator) in place 09/04/2022   NICM (nonischemic cardiomyopathy) (HCC) 03/20/2022   Malnutrition of moderate degree 09/23/2021   Dyslipidemia 09/21/2021   Anemia 09/21/2021   AKI (acute kidney injury) 09/21/2021   Essential hypertension 09/20/2021   CHF (congestive heart failure) (HCC) 05/08/2021   Acute on chronic systolic CHF (congestive heart failure) (HCC) 05/06/2021   Mitral regurgitation 12/26/2020   COVID-19 virus infection 12/24/2020   CHF exacerbation (HCC) 12/23/2020   Acute on chronic systolic HF (heart failure) (HCC) 06/24/2018   Aortic insufficiency 06/22/2018   Acute on chronic congestive heart failure (HCC) 06/22/2018   Elevated troponin 06/22/2018   CAD (coronary artery disease) 06/22/2018   Left bundle branch block 06/22/2018   History of iron  deficiency anemia 06/22/2018   Acute on chronic heart failure (HCC) 06/22/2018   Chronic bilateral low back pain  without sciatica 12/12/2015   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   Iron  deficiency anemia 03/28/2015   Hyperlipidemia 10/12/2014   Current Medications[1] Allergies[2]   Social History   Socioeconomic History   Marital status: Single    Spouse name: Not on file   Number of children: Not on file   Years of education: Not on file   Highest education level: Not on file  Occupational History   Not on file  Tobacco Use   Smoking status: Former    Current packs/day: 0.50    Types: Cigarettes   Smokeless tobacco: Never  Vaping Use   Vaping status: Never Used  Substance and Sexual Activity   Alcohol use: Yes    Comment: daily   Drug use: Yes    Types: Marijuana   Sexual activity: Not on file  Other Topics Concern   Not on file  Social History Narrative   Not on file   Social Drivers of Health   Tobacco Use: Medium Risk (02/25/2024)   Patient History    Smoking Tobacco Use: Former    Smokeless Tobacco Use: Never    Passive Exposure: Not on Actuary Strain: Not on file  Food Insecurity: Food Insecurity Present (10/17/2021)   Hunger Vital Sign    Worried About Running Out of Food in the Last Year: Often true    Ran Out of Food in the Last Year: Sometimes true  Transportation Needs: Unmet Transportation Needs (02/24/2024)   Epic    Lack of Transportation (  Medical): Yes    Lack of Transportation (Non-Medical): Yes  Physical Activity: Not on file  Stress: Not on file  Social Connections: Not on file  Intimate Partner Violence: Not on file  Depression [phone]): Low Risk (01/14/2023)   Depression (PHQ2-9)    PHQ-2 Score: 0  Alcohol Screen: Not on file  Housing: Not on file  Utilities: Not on file  Health Literacy: Not on file    Physical Exam      Future Appointments  Date Time Provider Department Center  08/25/2024  9:00 AM [facility] PA/NP [facility] None           [1]  Current Outpatient Medications:    aspirin  EC 81 MG tablet,  TAKE 1 TABLET (81 MG TOTAL) BY MOUTH DAILY. NEEDS FOLLOW UP APPOINTMENT FOR MORE REFILLS, Disp: 30 tablet, Rfl: 0   atorvastatin  (LIPITOR) 40 MG tablet, Take 1 tablet (40 mg total) by mouth daily., Disp: 90 tablet, Rfl: 1   carvedilol  (COREG ) 3.125 MG tablet, Take 1 tablet (3.125 mg total) by mouth 2 (two) times daily with a meal., Disp: 180 tablet, Rfl: 3   dapagliflozin  propanediol (FARXIGA ) 10 MG TABS tablet, Take 1 tablet (10 mg total) by mouth daily., Disp: 90 tablet, Rfl: 1   FEROSUL 325 (65 Fe) MG tablet, TAKE 1 TABLET (325 MG TOTAL) BY MOUTH TWO (TWO) TIMES DAILY, Disp: 30 tablet, Rfl: 1   furosemide  (LASIX ) 40 MG tablet, TAKE 1 TABLET (40 MG TOTAL) BY MOUTH DAILY, Disp: 90 tablet, Rfl: 3   potassium chloride  (KLOR-CON  M) 10 MEQ tablet, Take 2 tablets (20 mEq total) by mouth daily., Disp: 90 tablet, Rfl: 3   sacubitril -valsartan  (ENTRESTO ) 49-51 MG, Take 1 tablet by mouth 2 (two) times daily., Disp: 180 tablet, Rfl: 3   spironolactone  (ALDACTONE ) 25 MG tablet, TAKE ONE TABLET BY MOUTH AT BEDTIME, Disp: 30 tablet, Rfl: 2 [2] No Known Allergies

## 2024-05-27 ENCOUNTER — Other Ambulatory Visit (HOSPITAL_COMMUNITY): Payer: Self-pay | Admitting: Emergency Medicine

## 2024-05-27 NOTE — Progress Notes (Signed)
 Paramedicine Encounter    Patient ID: Jeremy Powers, male    DOB: May 15, 1956, 69 y.o.   MRN: [id number]   Complaints Had some blood in his mucus when he blew his nose and also c/o chronic left knee pain w/ some swelling noted.  This has been an ongoing issue for about a year.  There was no trauma noted to precipitate this pain or swelling.     Assessment A&O x 4, skin W&D w/ good color.  Denies chest pain or SOB.    Compliance with meds got slightly off track  Pill box filled x 2 weeks  Refills needed NONE  Meds changes since last visit NONE    Social changes NONE   BP 130/60 (BP Location: Left Arm, Patient Position: Sitting;Standing, Cuff Size: Normal)   Pulse 66   Resp 16   SpO2 93%  Weight yesterday- Last visit weight-  ATF [name] A&O x 4, skin W&D w/ good color.  Pt denies chest pain or SOB.  He does say he had some traces of blood when he blew his nose.  He denies having a nose bleed just said there was some blood when he blew his nose.  Advised him due to dried nasal passages or from issues w/ his sinuses.  He denies headache or dizziness. He is afebrile. We discussed the swelling just above his left knee.  He states the discomfort has come back and he notices a little more swelling than before. I contacted Sports Medicine office on [address] and scheduled an appointment for January 21st @ 8:30 w/ Dr. Redell Robes.  I will assist [name] in setting up transportation for this appointment. ACTION: Home visit completed  [name] [phone] 05/27/2024  Patient Care Team: Dorina Dallas RIGGERS as PCP - General (Internal Medicine) Cherrie Toribio SAUNDERS, MD as PCP - Cardiology (Cardiology)  Patient Active Problem List   Diagnosis Date Noted   Lipoma 12/18/2022   Biventricular ICD (implantable cardioverter-defibrillator) in place 09/04/2022   NICM (nonischemic cardiomyopathy) (HCC) 03/20/2022   Malnutrition of moderate degree 09/23/2021    Dyslipidemia 09/21/2021   Anemia 09/21/2021   AKI (acute kidney injury) 09/21/2021   Essential hypertension 09/20/2021   CHF (congestive heart failure) (HCC) 05/08/2021   Acute on chronic systolic CHF (congestive heart failure) (HCC) 05/06/2021   Mitral regurgitation 12/26/2020   COVID-19 virus infection 12/24/2020   CHF exacerbation (HCC) 12/23/2020   Acute on chronic systolic HF (heart failure) (HCC) 06/24/2018   Aortic insufficiency 06/22/2018   Acute on chronic congestive heart failure (HCC) 06/22/2018   Elevated troponin 06/22/2018   CAD (coronary artery disease) 06/22/2018   Left bundle branch block 06/22/2018   History of iron  deficiency anemia 06/22/2018   Acute on chronic heart failure (HCC) 06/22/2018   Chronic bilateral low back pain without sciatica 12/12/2015   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   Iron  deficiency anemia 03/28/2015   Hyperlipidemia 10/12/2014   Current Medications[1] Allergies[2]   Social History   Socioeconomic History   Marital status: Single    Spouse name: Not on file   Number of children: Not on file   Years of education: Not on file   Highest education level: Not on file  Occupational History   Not on file  Tobacco Use   Smoking status: Former    Current packs/day: 0.50    Types: Cigarettes   Smokeless tobacco: Never  Vaping Use   Vaping status: Never Used  Substance and Sexual Activity   Alcohol use: Yes    Comment: daily   Drug use: Yes    Types: Marijuana   Sexual activity: Not on file  Other Topics Concern   Not on file  Social History Narrative   Not on file   Social Drivers of Health   Tobacco Use: Medium Risk (02/25/2024)   Patient History    Smoking Tobacco Use: Former    Smokeless Tobacco Use: Never    Passive Exposure: Not on file  Financial Resource Strain: Not on file  Food Insecurity: Food Insecurity Present (10/17/2021)   Hunger Vital Sign    Worried About Running Out of Food in the Last  Year: Often true    Ran Out of Food in the Last Year: Sometimes true  Transportation Needs: Unmet Transportation Needs (02/24/2024)   Epic    Lack of Transportation (Medical): Yes    Lack of Transportation (Non-Medical): Yes  Physical Activity: Not on file  Stress: Not on file  Social Connections: Not on file  Intimate Partner Violence: Not on file  Depression (PHQ2-9): Low Risk (01/14/2023)   Depression (PHQ2-9)    PHQ-2 Score: 0  Alcohol Screen: Not on file  Housing: Not on file  Utilities: Not on file  Health Literacy: Not on file    Physical Exam      Future Appointments  Date Time Provider Department Center  06/10/2024  8:30 AM Charles Redell LABOR, DO [facility]  08/25/2024  9:00 AM [facility] PA/NP [facility] None       [phone] Dr Charles Sports medicine  93% 66 130/60    [1]  Current Outpatient Medications:    aspirin  EC 81 MG tablet, TAKE 1 TABLET (81 MG TOTAL) BY MOUTH DAILY. NEEDS FOLLOW UP APPOINTMENT FOR MORE REFILLS, Disp: 30 tablet, Rfl: 0   atorvastatin  (LIPITOR) 40 MG tablet, Take 1 tablet (40 mg total) by mouth daily., Disp: 90 tablet, Rfl: 1   carvedilol  (COREG ) 3.125 MG tablet, Take 1 tablet (3.125 mg total) by mouth 2 (two) times daily with a meal., Disp: 180 tablet, Rfl: 3   dapagliflozin  propanediol (FARXIGA ) 10 MG TABS tablet, Take 1 tablet (10 mg total) by mouth daily., Disp: 90 tablet, Rfl: 1   FEROSUL 325 (65 Fe) MG tablet, TAKE 1 TABLET (325 MG TOTAL) BY MOUTH TWO (TWO) TIMES DAILY, Disp: 30 tablet, Rfl: 1   furosemide  (LASIX ) 40 MG tablet, TAKE 1 TABLET (40 MG TOTAL) BY MOUTH DAILY, Disp: 90 tablet, Rfl: 3   potassium chloride  (KLOR-CON  M) 10 MEQ tablet, Take 2 tablets (20 mEq total) by mouth daily., Disp: 90 tablet, Rfl: 3   sacubitril -valsartan  (ENTRESTO ) 49-51 MG, Take 1 tablet by mouth 2 (two) times daily., Disp: 180 tablet, Rfl: 3   spironolactone  (ALDACTONE ) 25 MG tablet, TAKE ONE TABLET BY MOUTH AT BEDTIME, Disp: 30 tablet, Rfl: 2 [2] No Known  Allergies

## 2024-06-10 ENCOUNTER — Encounter

## 2024-06-10 NOTE — Progress Notes (Unsigned)
 Erroneous encounter due to cancellation or no-show. Patient was not seen.

## 2024-06-11 ENCOUNTER — Other Ambulatory Visit (HOSPITAL_COMMUNITY): Payer: Self-pay | Admitting: Emergency Medicine

## 2024-06-11 NOTE — Progress Notes (Signed)
 Paramedicine Encounter    Patient ID: Jeremy Powers, male    DOB: 1956-04-29, 69 y.o.   MRN: [id number]   Complaints NONE  Assessment A&O x 4, skin W&D w/ good color.  Denies chest pain or SOB.  Lung sounds clear and equal bilat. No peripheral edema noted.  Compliance with meds YES  Pill box filled x 2 weeks  Refills needed NONE  Meds changes since last visit NONE    Social changes NONE   BP 138/60 (BP Location: Left Arm, Patient Position: Sitting, Cuff Size: Normal)   Pulse 84   Resp 14   SpO2 96%   Pt has an appointment with Dr. Redell Robes w/ Sports Medicine regarding swelling to his left leg above his knee.  Transportation has been scheduled and pt is aware.  Provided pt with ride information and he verbalizes he understands same. ACTION: Home visit completed  [name] [phone] 06/18/24  Patient Care Team: Dorina Dallas RIGGERS as PCP - General (Internal Medicine) Cherrie Toribio SAUNDERS, MD as PCP - Cardiology (Cardiology)  Patient Active Problem List   Diagnosis Date Noted   Lipoma 12/18/2022   Biventricular ICD (implantable cardioverter-defibrillator) in place 09/04/2022   NICM (nonischemic cardiomyopathy) (HCC) 03/20/2022   Malnutrition of moderate degree 09/23/2021   Dyslipidemia 09/21/2021   Anemia 09/21/2021   AKI (acute kidney injury) 09/21/2021   Essential hypertension 09/20/2021   CHF (congestive heart failure) (HCC) 05/08/2021   Acute on chronic systolic CHF (congestive heart failure) (HCC) 05/06/2021   Mitral regurgitation 12/26/2020   COVID-19 virus infection 12/24/2020   CHF exacerbation (HCC) 12/23/2020   Acute on chronic systolic HF (heart failure) (HCC) 06/24/2018   Aortic insufficiency 06/22/2018   Acute on chronic congestive heart failure (HCC) 06/22/2018   Elevated troponin 06/22/2018   CAD (coronary artery disease) 06/22/2018   Left bundle branch block 06/22/2018   History of iron  deficiency anemia 06/22/2018    Acute on chronic heart failure (HCC) 06/22/2018   Chronic bilateral low back pain without sciatica 12/12/2015   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   Iron  deficiency anemia 03/28/2015   Hyperlipidemia 10/12/2014   Current Medications[1] Allergies[2]   Social History   Socioeconomic History   Marital status: Single    Spouse name: Not on file   Number of children: Not on file   Years of education: Not on file   Highest education level: Not on file  Occupational History   Not on file  Tobacco Use   Smoking status: Former    Current packs/day: 0.50    Types: Cigarettes   Smokeless tobacco: Never  Vaping Use   Vaping status: Never Used  Substance and Sexual Activity   Alcohol use: Yes    Comment: daily   Drug use: Yes    Types: Marijuana   Sexual activity: Not on file  Other Topics Concern   Not on file  Social History Narrative   Not on file   Social Drivers of Health   Tobacco Use: Medium Risk (02/25/2024)   Patient History    Smoking Tobacco Use: Former    Smokeless Tobacco Use: Never    Passive Exposure: Not on Actuary Strain: Not on file  Food Insecurity: Food Insecurity Present (10/17/2021)   Hunger Vital Sign    Worried About Radiation Protection Practitioner of Food in the Last Year: Often true    Ran Out of Food in the Last Year: Sometimes true  Transportation Needs: Unmet Transportation  Needs (02/24/2024)   Epic    Lack of Transportation (Medical): Yes    Lack of Transportation (Non-Medical): Yes  Physical Activity: Not on file  Stress: Not on file  Social Connections: Not on file  Intimate Partner Violence: Not on file  Depression (PHQ2-9): Low Risk (01/14/2023)   Depression (PHQ2-9)    PHQ-2 Score: 0  Alcohol Screen: Not on file  Housing: Not on file  Utilities: Not on file  Health Literacy: Not on file    Physical Exam      Future Appointments  Date Time Provider Department Center  08/25/2024  9:00 AM [facility] PA/NP [facility] None   09/16/2024  7:10 AM [facility] DEVICE REMOTES [facility]  12/16/2024  7:10 AM [facility] DEVICE REMOTES [facility]  03/17/2025  7:10 AM [facility] DEVICE REMOTES [facility]  06/16/2025  7:10 AM [facility] DEVICE REMOTES [facility] H&V           [1]  Current Outpatient Medications:    aspirin  EC 81 MG tablet, TAKE 1 TABLET (81 MG TOTAL) BY MOUTH DAILY. NEEDS FOLLOW UP APPOINTMENT FOR MORE REFILLS, Disp: 30 tablet, Rfl: 0   atorvastatin  (LIPITOR) 40 MG tablet, Take 1 tablet (40 mg total) by mouth daily., Disp: 90 tablet, Rfl: 1   carvedilol  (COREG ) 3.125 MG tablet, Take 1 tablet (3.125 mg total) by mouth 2 (two) times daily with a meal., Disp: 180 tablet, Rfl: 3   dapagliflozin  propanediol (FARXIGA ) 10 MG TABS tablet, Take 1 tablet (10 mg total) by mouth daily., Disp: 90 tablet, Rfl: 1   FEROSUL 325 (65 Fe) MG tablet, TAKE 1 TABLET (325 MG TOTAL) BY MOUTH TWO (TWO) TIMES DAILY, Disp: 30 tablet, Rfl: 1   furosemide  (LASIX ) 40 MG tablet, TAKE 1 TABLET (40 MG TOTAL) BY MOUTH DAILY, Disp: 90 tablet, Rfl: 3   potassium chloride  (KLOR-CON  M) 10 MEQ tablet, Take 2 tablets (20 mEq total) by mouth daily., Disp: 90 tablet, Rfl: 3   sacubitril -valsartan  (ENTRESTO ) 49-51 MG, Take 1 tablet by mouth 2 (two) times daily., Disp: 180 tablet, Rfl: 3   spironolactone  (ALDACTONE ) 25 MG tablet, TAKE ONE TABLET BY MOUTH AT BEDTIME, Disp: 30 tablet, Rfl: 2 [2] No Known Allergies

## 2024-06-11 NOTE — Progress Notes (Unsigned)
" ° °  Subjective:    Patient ID: Jeremy Powers, male    DOB: 69 y.o., Dec 07, 1955   MRN: [id number]  Chief Complaint: Left flank/groin  Discussed the use of AI scribe software for clinical note transcription with the patient, who gave verbal consent to proceed.  History of Present Illness Patient previously evaluated by my colleague, Dr. Arvell, in 2024.  At that time patient main complaint of swelling in the left thigh for the past year which onset after cardiac surgery.  At which time an x-ray showed mild degenerative changes and an ultrasound of a mass in his groin revealed a solid-appearing rather than cystic appearing mass.  An MRI was ordered to further characterize the lesion but does not appear to have been obtained.   Review of Pertinent Imaging: 4 view blood radiographs obtained of the right knee on 01/17/2022 per my independent evaluation revealing prominent vascular calcifications noted on the posterior aspect of the femur and fibula.  Mild lateral patellar tilt.  Largely well-preserved medial and lateral compartment joint spaces minimal narrowing of medial compartment.    Objective:   There were no vitals filed for this visit.      Assessment & Plan:   Assessment & Plan    "

## 2024-06-17 ENCOUNTER — Ambulatory Visit

## 2024-06-17 ENCOUNTER — Encounter

## 2024-06-17 DIAGNOSIS — I428 Other cardiomyopathies: Secondary | ICD-10-CM

## 2024-06-17 LAB — CUP PACEART REMOTE DEVICE CHECK
Battery Remaining Longevity: 120 mo
Battery Remaining Percentage: 100 %
Brady Statistic RA Percent Paced: 1 %
Brady Statistic RV Percent Paced: 44 %
Date Time Interrogation Session: 20260128014000
HighPow Impedance: 61 Ohm
Implantable Lead Connection Status: 753985
Implantable Lead Connection Status: 753985
Implantable Lead Connection Status: 753985
Implantable Lead Implant Date: 20240108
Implantable Lead Implant Date: 20240108
Implantable Lead Implant Date: 20240108
Implantable Lead Location: 753858
Implantable Lead Location: 753859
Implantable Lead Location: 753860
Implantable Lead Model: 137
Implantable Lead Model: 4671
Implantable Lead Model: 7841
Implantable Lead Serial Number: 1396493
Implantable Lead Serial Number: 301415
Implantable Lead Serial Number: 881901
Implantable Pulse Generator Implant Date: 20240108
Lead Channel Impedance Value: 413 Ohm
Lead Channel Impedance Value: 481 Ohm
Lead Channel Impedance Value: 516 Ohm
Lead Channel Pacing Threshold Amplitude: 0.4 V
Lead Channel Pacing Threshold Amplitude: 0.7 V
Lead Channel Pacing Threshold Amplitude: 1 V
Lead Channel Pacing Threshold Pulse Width: 0.4 ms
Lead Channel Pacing Threshold Pulse Width: 0.4 ms
Lead Channel Pacing Threshold Pulse Width: 0.4 ms
Lead Channel Setting Pacing Amplitude: 2.5 V
Lead Channel Setting Pacing Amplitude: 2.5 V
Lead Channel Setting Pacing Amplitude: 2.5 V
Lead Channel Setting Pacing Pulse Width: 0.4 ms
Lead Channel Setting Pacing Pulse Width: 0.4 ms
Lead Channel Setting Sensing Sensitivity: 0.5 mV
Lead Channel Setting Sensing Sensitivity: 1 mV
Pulse Gen Serial Number: 394480

## 2024-06-17 NOTE — Progress Notes (Unsigned)
 Erroneous encounter due to cancellation or no-show. Patient was not seen.

## 2024-06-17 NOTE — Progress Notes (Unsigned)
" ° °  Subjective:    Patient ID: Jeremy Powers, male    DOB: 69 y.o., [birth year]   MRN: [id number]  Chief Complaint: Left flank/groin  Discussed the use of AI scribe software for clinical note transcription with the patient, who gave verbal consent to proceed.  History of Present Illness Patient previously evaluated by my colleague, Dr. Arvell, in 2024.  At that time patient main complaint of swelling in the left thigh for the past year which onset after cardiac surgery.  At which time an x-ray showed mild degenerative changes and an ultrasound of a mass in his groin revealed a solid-appearing rather than cystic appearing mass.  An MRI was ordered to further characterize the lesion but does not appear to have been obtained.   Review of Pertinent Imaging: 4 view blood radiographs obtained of the right knee on 01/17/2022 per my independent evaluation revealing prominent vascular calcifications noted on the posterior aspect of the femur and fibula.  Mild lateral patellar tilt.  Largely well-preserved medial and lateral compartment joint spaces minimal narrowing of medial compartment.    Objective:   There were no vitals filed for this visit.      Assessment & Plan:   Assessment & Plan    "

## 2024-06-18 ENCOUNTER — Telehealth (HOSPITAL_COMMUNITY): Payer: Self-pay | Admitting: Emergency Medicine

## 2024-06-18 ENCOUNTER — Ambulatory Visit: Payer: Self-pay | Admitting: Cardiovascular Disease

## 2024-06-18 ENCOUNTER — Ambulatory Visit

## 2024-06-18 NOTE — Telephone Encounter (Signed)
 Tosha called to advise that Jeremy Powers's transportation did not show up for his appointment with Dr. Delfina w/ Sports Medicine.  I will reach out to the office and get him rescheduled.    Mary Sharps, EMT-Paramedic [phone] 06/18/2024

## 2024-06-18 NOTE — Telephone Encounter (Signed)
 Called to remind [name] of his appointment this morning. Tosha advised she will let him know.    Mary Sharps, EMT-Paramedic [phone] 06/18/2024

## 2024-06-18 NOTE — Progress Notes (Signed)
 Created in error/duplicate    Mary Sharps, EMT-Paramedic [phone] 06/18/2024

## 2024-06-25 NOTE — Progress Notes (Signed)
 Remote ICD Transmission

## 2024-08-25 ENCOUNTER — Encounter (HOSPITAL_COMMUNITY)

## 2024-09-16 ENCOUNTER — Ambulatory Visit

## 2024-12-16 ENCOUNTER — Ambulatory Visit

## 2025-03-17 ENCOUNTER — Ambulatory Visit

## 2025-06-16 ENCOUNTER — Ambulatory Visit
